# Patient Record
Sex: Female | Born: 1970
Health system: Southern US, Community
[De-identification: ages and names within clinical notes are randomized; demographics above are authoritative.]

## PROBLEM LIST (undated history)

## (undated) DIAGNOSIS — K9189 Other postprocedural complications and disorders of digestive system: Secondary | ICD-10-CM

## (undated) DIAGNOSIS — H547 Unspecified visual loss: Secondary | ICD-10-CM

## (undated) DIAGNOSIS — I639 Cerebral infarction, unspecified: Secondary | ICD-10-CM

## (undated) DIAGNOSIS — I10 Essential (primary) hypertension: Secondary | ICD-10-CM

## (undated) DIAGNOSIS — I2510 Atherosclerotic heart disease of native coronary artery without angina pectoris: Secondary | ICD-10-CM

## (undated) DIAGNOSIS — D649 Anemia, unspecified: Secondary | ICD-10-CM

## (undated) DIAGNOSIS — M1990 Unspecified osteoarthritis, unspecified site: Secondary | ICD-10-CM

## (undated) DIAGNOSIS — E119 Type 2 diabetes mellitus without complications: Secondary | ICD-10-CM

## (undated) DIAGNOSIS — G4730 Sleep apnea, unspecified: Secondary | ICD-10-CM

## (undated) DIAGNOSIS — F32A Depression, unspecified: Secondary | ICD-10-CM

## (undated) DIAGNOSIS — R413 Other amnesia: Secondary | ICD-10-CM

## (undated) DIAGNOSIS — K219 Gastro-esophageal reflux disease without esophagitis: Secondary | ICD-10-CM

## (undated) DIAGNOSIS — E785 Hyperlipidemia, unspecified: Secondary | ICD-10-CM

## (undated) DIAGNOSIS — E039 Hypothyroidism, unspecified: Secondary | ICD-10-CM

## (undated) DIAGNOSIS — N289 Disorder of kidney and ureter, unspecified: Secondary | ICD-10-CM

## (undated) DIAGNOSIS — Z9289 Personal history of other medical treatment: Secondary | ICD-10-CM

## (undated) DIAGNOSIS — J449 Chronic obstructive pulmonary disease, unspecified: Secondary | ICD-10-CM

## (undated) DIAGNOSIS — I509 Heart failure, unspecified: Secondary | ICD-10-CM

## (undated) DIAGNOSIS — I252 Old myocardial infarction: Secondary | ICD-10-CM

## (undated) DIAGNOSIS — R739 Hyperglycemia, unspecified: Secondary | ICD-10-CM

## (undated) DIAGNOSIS — F419 Anxiety disorder, unspecified: Secondary | ICD-10-CM

## (undated) DIAGNOSIS — K567 Ileus, unspecified: Secondary | ICD-10-CM

## (undated) HISTORY — DX: Other postprocedural complications and disorders of digestive system: K91.89

## (undated) HISTORY — DX: Hyperglycemia, unspecified: R73.9

## (undated) HISTORY — DX: Ileus, unspecified: K56.7

## (undated) HISTORY — PX: ABDOMINAL HYSTERECTOMY: SHX81

## (undated) HISTORY — PX: EYE SURGERY: SHX253

## (undated) HISTORY — PX: TOE AMPUTATION: SHX809

## (undated) HISTORY — DX: Ileus, unspecified: K91.89

---

## 2012-11-16 HISTORY — PX: ABDOMINAL HYSTERECTOMY: SHX81

## 2016-04-21 DIAGNOSIS — M542 Cervicalgia: Secondary | ICD-10-CM

## 2016-04-21 HISTORY — DX: Cervicalgia: M54.2

## 2016-05-04 DIAGNOSIS — E1142 Type 2 diabetes mellitus with diabetic polyneuropathy: Secondary | ICD-10-CM

## 2016-05-04 HISTORY — DX: Type 2 diabetes mellitus with diabetic polyneuropathy: E11.42

## 2016-12-28 DIAGNOSIS — E669 Obesity, unspecified: Secondary | ICD-10-CM | POA: Insufficient documentation

## 2018-08-08 DIAGNOSIS — E785 Hyperlipidemia, unspecified: Secondary | ICD-10-CM | POA: Diagnosis not present

## 2018-08-08 DIAGNOSIS — Z23 Encounter for immunization: Secondary | ICD-10-CM | POA: Diagnosis not present

## 2018-08-08 DIAGNOSIS — Z1331 Encounter for screening for depression: Secondary | ICD-10-CM | POA: Diagnosis not present

## 2018-08-08 DIAGNOSIS — Z1339 Encounter for screening examination for other mental health and behavioral disorders: Secondary | ICD-10-CM | POA: Diagnosis not present

## 2018-08-08 DIAGNOSIS — I509 Heart failure, unspecified: Secondary | ICD-10-CM | POA: Diagnosis not present

## 2018-08-08 DIAGNOSIS — I10 Essential (primary) hypertension: Secondary | ICD-10-CM | POA: Diagnosis not present

## 2018-08-08 DIAGNOSIS — E039 Hypothyroidism, unspecified: Secondary | ICD-10-CM | POA: Diagnosis not present

## 2018-08-08 DIAGNOSIS — E118 Type 2 diabetes mellitus with unspecified complications: Secondary | ICD-10-CM | POA: Diagnosis not present

## 2018-08-08 DIAGNOSIS — F332 Major depressive disorder, recurrent severe without psychotic features: Secondary | ICD-10-CM | POA: Diagnosis not present

## 2018-08-15 DIAGNOSIS — I509 Heart failure, unspecified: Secondary | ICD-10-CM | POA: Diagnosis not present

## 2018-08-15 DIAGNOSIS — E118 Type 2 diabetes mellitus with unspecified complications: Secondary | ICD-10-CM | POA: Diagnosis not present

## 2018-08-15 DIAGNOSIS — N183 Chronic kidney disease, stage 3 (moderate): Secondary | ICD-10-CM | POA: Diagnosis not present

## 2018-08-15 DIAGNOSIS — E785 Hyperlipidemia, unspecified: Secondary | ICD-10-CM | POA: Diagnosis not present

## 2018-08-16 DIAGNOSIS — M159 Polyosteoarthritis, unspecified: Secondary | ICD-10-CM | POA: Diagnosis not present

## 2018-08-16 DIAGNOSIS — I639 Cerebral infarction, unspecified: Secondary | ICD-10-CM | POA: Diagnosis not present

## 2018-08-16 DIAGNOSIS — E875 Hyperkalemia: Secondary | ICD-10-CM | POA: Diagnosis not present

## 2018-08-16 DIAGNOSIS — J449 Chronic obstructive pulmonary disease, unspecified: Secondary | ICD-10-CM | POA: Diagnosis not present

## 2018-08-16 DIAGNOSIS — I2510 Atherosclerotic heart disease of native coronary artery without angina pectoris: Secondary | ICD-10-CM | POA: Diagnosis not present

## 2018-08-16 DIAGNOSIS — E118 Type 2 diabetes mellitus with unspecified complications: Secondary | ICD-10-CM | POA: Diagnosis not present

## 2018-08-16 DIAGNOSIS — N183 Chronic kidney disease, stage 3 (moderate): Secondary | ICD-10-CM | POA: Diagnosis not present

## 2018-08-16 DIAGNOSIS — D649 Anemia, unspecified: Secondary | ICD-10-CM | POA: Diagnosis not present

## 2018-08-16 DIAGNOSIS — I509 Heart failure, unspecified: Secondary | ICD-10-CM | POA: Diagnosis not present

## 2018-08-19 DIAGNOSIS — I639 Cerebral infarction, unspecified: Secondary | ICD-10-CM | POA: Diagnosis not present

## 2018-08-19 DIAGNOSIS — J449 Chronic obstructive pulmonary disease, unspecified: Secondary | ICD-10-CM | POA: Diagnosis not present

## 2018-08-19 DIAGNOSIS — E875 Hyperkalemia: Secondary | ICD-10-CM | POA: Diagnosis not present

## 2018-08-19 DIAGNOSIS — I2510 Atherosclerotic heart disease of native coronary artery without angina pectoris: Secondary | ICD-10-CM | POA: Diagnosis not present

## 2018-08-19 DIAGNOSIS — N183 Chronic kidney disease, stage 3 (moderate): Secondary | ICD-10-CM | POA: Diagnosis not present

## 2018-08-19 DIAGNOSIS — D649 Anemia, unspecified: Secondary | ICD-10-CM | POA: Diagnosis not present

## 2018-08-19 DIAGNOSIS — M159 Polyosteoarthritis, unspecified: Secondary | ICD-10-CM | POA: Diagnosis not present

## 2018-08-19 DIAGNOSIS — I509 Heart failure, unspecified: Secondary | ICD-10-CM | POA: Diagnosis not present

## 2018-08-19 DIAGNOSIS — E118 Type 2 diabetes mellitus with unspecified complications: Secondary | ICD-10-CM | POA: Diagnosis not present

## 2018-08-22 DIAGNOSIS — I639 Cerebral infarction, unspecified: Secondary | ICD-10-CM | POA: Diagnosis not present

## 2018-08-22 DIAGNOSIS — I509 Heart failure, unspecified: Secondary | ICD-10-CM | POA: Diagnosis not present

## 2018-08-22 DIAGNOSIS — I2510 Atherosclerotic heart disease of native coronary artery without angina pectoris: Secondary | ICD-10-CM | POA: Diagnosis not present

## 2018-08-22 DIAGNOSIS — M159 Polyosteoarthritis, unspecified: Secondary | ICD-10-CM | POA: Diagnosis not present

## 2018-08-22 DIAGNOSIS — E875 Hyperkalemia: Secondary | ICD-10-CM | POA: Diagnosis not present

## 2018-08-22 DIAGNOSIS — E118 Type 2 diabetes mellitus with unspecified complications: Secondary | ICD-10-CM | POA: Diagnosis not present

## 2018-08-22 DIAGNOSIS — N183 Chronic kidney disease, stage 3 (moderate): Secondary | ICD-10-CM | POA: Diagnosis not present

## 2018-08-22 DIAGNOSIS — D649 Anemia, unspecified: Secondary | ICD-10-CM | POA: Diagnosis not present

## 2018-08-22 DIAGNOSIS — J449 Chronic obstructive pulmonary disease, unspecified: Secondary | ICD-10-CM | POA: Diagnosis not present

## 2018-08-29 DIAGNOSIS — M159 Polyosteoarthritis, unspecified: Secondary | ICD-10-CM | POA: Diagnosis not present

## 2018-08-29 DIAGNOSIS — E875 Hyperkalemia: Secondary | ICD-10-CM | POA: Diagnosis not present

## 2018-08-29 DIAGNOSIS — N183 Chronic kidney disease, stage 3 (moderate): Secondary | ICD-10-CM | POA: Diagnosis not present

## 2018-08-29 DIAGNOSIS — J449 Chronic obstructive pulmonary disease, unspecified: Secondary | ICD-10-CM | POA: Diagnosis not present

## 2018-08-29 DIAGNOSIS — I509 Heart failure, unspecified: Secondary | ICD-10-CM | POA: Diagnosis not present

## 2018-08-29 DIAGNOSIS — I2510 Atherosclerotic heart disease of native coronary artery without angina pectoris: Secondary | ICD-10-CM | POA: Diagnosis not present

## 2018-08-29 DIAGNOSIS — I679 Cerebrovascular disease, unspecified: Secondary | ICD-10-CM | POA: Diagnosis not present

## 2018-08-29 DIAGNOSIS — D649 Anemia, unspecified: Secondary | ICD-10-CM | POA: Diagnosis not present

## 2018-08-29 DIAGNOSIS — F419 Anxiety disorder, unspecified: Secondary | ICD-10-CM | POA: Diagnosis not present

## 2018-09-08 DIAGNOSIS — S83512A Sprain of anterior cruciate ligament of left knee, initial encounter: Secondary | ICD-10-CM | POA: Diagnosis not present

## 2018-09-08 DIAGNOSIS — S8392XA Sprain of unspecified site of left knee, initial encounter: Secondary | ICD-10-CM | POA: Diagnosis not present

## 2018-09-12 DIAGNOSIS — J449 Chronic obstructive pulmonary disease, unspecified: Secondary | ICD-10-CM | POA: Diagnosis not present

## 2018-09-12 DIAGNOSIS — I679 Cerebrovascular disease, unspecified: Secondary | ICD-10-CM | POA: Diagnosis not present

## 2018-09-12 DIAGNOSIS — I509 Heart failure, unspecified: Secondary | ICD-10-CM | POA: Diagnosis not present

## 2018-09-12 DIAGNOSIS — N183 Chronic kidney disease, stage 3 (moderate): Secondary | ICD-10-CM | POA: Diagnosis not present

## 2018-09-12 DIAGNOSIS — F419 Anxiety disorder, unspecified: Secondary | ICD-10-CM | POA: Diagnosis not present

## 2018-09-12 DIAGNOSIS — M159 Polyosteoarthritis, unspecified: Secondary | ICD-10-CM | POA: Diagnosis not present

## 2018-09-12 DIAGNOSIS — F3341 Major depressive disorder, recurrent, in partial remission: Secondary | ICD-10-CM | POA: Diagnosis not present

## 2018-09-12 DIAGNOSIS — I2510 Atherosclerotic heart disease of native coronary artery without angina pectoris: Secondary | ICD-10-CM | POA: Diagnosis not present

## 2018-09-12 DIAGNOSIS — D649 Anemia, unspecified: Secondary | ICD-10-CM | POA: Diagnosis not present

## 2018-09-20 DIAGNOSIS — M159 Polyosteoarthritis, unspecified: Secondary | ICD-10-CM | POA: Diagnosis not present

## 2018-09-20 DIAGNOSIS — I509 Heart failure, unspecified: Secondary | ICD-10-CM | POA: Diagnosis not present

## 2018-09-20 DIAGNOSIS — J449 Chronic obstructive pulmonary disease, unspecified: Secondary | ICD-10-CM | POA: Diagnosis not present

## 2018-09-20 DIAGNOSIS — I679 Cerebrovascular disease, unspecified: Secondary | ICD-10-CM | POA: Diagnosis not present

## 2018-09-20 DIAGNOSIS — F419 Anxiety disorder, unspecified: Secondary | ICD-10-CM | POA: Diagnosis not present

## 2018-09-20 DIAGNOSIS — D649 Anemia, unspecified: Secondary | ICD-10-CM | POA: Diagnosis not present

## 2018-09-20 DIAGNOSIS — F3341 Major depressive disorder, recurrent, in partial remission: Secondary | ICD-10-CM | POA: Diagnosis not present

## 2018-09-20 DIAGNOSIS — G5603 Carpal tunnel syndrome, bilateral upper limbs: Secondary | ICD-10-CM | POA: Diagnosis not present

## 2018-09-20 DIAGNOSIS — N183 Chronic kidney disease, stage 3 (moderate): Secondary | ICD-10-CM | POA: Diagnosis not present

## 2018-10-03 DIAGNOSIS — K219 Gastro-esophageal reflux disease without esophagitis: Secondary | ICD-10-CM | POA: Diagnosis not present

## 2018-10-03 DIAGNOSIS — J449 Chronic obstructive pulmonary disease, unspecified: Secondary | ICD-10-CM | POA: Diagnosis not present

## 2018-10-03 DIAGNOSIS — I679 Cerebrovascular disease, unspecified: Secondary | ICD-10-CM | POA: Diagnosis not present

## 2018-10-03 DIAGNOSIS — G5603 Carpal tunnel syndrome, bilateral upper limbs: Secondary | ICD-10-CM | POA: Diagnosis not present

## 2018-10-03 DIAGNOSIS — D649 Anemia, unspecified: Secondary | ICD-10-CM | POA: Diagnosis not present

## 2018-10-03 DIAGNOSIS — I509 Heart failure, unspecified: Secondary | ICD-10-CM | POA: Diagnosis not present

## 2018-10-03 DIAGNOSIS — M159 Polyosteoarthritis, unspecified: Secondary | ICD-10-CM | POA: Diagnosis not present

## 2018-10-03 DIAGNOSIS — N183 Chronic kidney disease, stage 3 (moderate): Secondary | ICD-10-CM | POA: Diagnosis not present

## 2018-10-03 DIAGNOSIS — I10 Essential (primary) hypertension: Secondary | ICD-10-CM | POA: Diagnosis not present

## 2018-10-22 DIAGNOSIS — J449 Chronic obstructive pulmonary disease, unspecified: Secondary | ICD-10-CM | POA: Diagnosis not present

## 2018-10-22 DIAGNOSIS — I10 Essential (primary) hypertension: Secondary | ICD-10-CM | POA: Diagnosis not present

## 2018-10-22 DIAGNOSIS — D649 Anemia, unspecified: Secondary | ICD-10-CM | POA: Diagnosis not present

## 2018-10-22 DIAGNOSIS — I679 Cerebrovascular disease, unspecified: Secondary | ICD-10-CM | POA: Diagnosis not present

## 2018-10-22 DIAGNOSIS — M159 Polyosteoarthritis, unspecified: Secondary | ICD-10-CM | POA: Diagnosis not present

## 2018-10-22 DIAGNOSIS — G5603 Carpal tunnel syndrome, bilateral upper limbs: Secondary | ICD-10-CM | POA: Diagnosis not present

## 2018-10-22 DIAGNOSIS — E669 Obesity, unspecified: Secondary | ICD-10-CM | POA: Diagnosis not present

## 2018-10-22 DIAGNOSIS — I509 Heart failure, unspecified: Secondary | ICD-10-CM | POA: Diagnosis not present

## 2018-10-22 DIAGNOSIS — N183 Chronic kidney disease, stage 3 (moderate): Secondary | ICD-10-CM | POA: Diagnosis not present

## 2018-11-21 DIAGNOSIS — I509 Heart failure, unspecified: Secondary | ICD-10-CM | POA: Diagnosis not present

## 2018-11-21 DIAGNOSIS — F419 Anxiety disorder, unspecified: Secondary | ICD-10-CM | POA: Diagnosis not present

## 2018-11-21 DIAGNOSIS — N183 Chronic kidney disease, stage 3 (moderate): Secondary | ICD-10-CM | POA: Diagnosis not present

## 2018-11-21 DIAGNOSIS — M159 Polyosteoarthritis, unspecified: Secondary | ICD-10-CM | POA: Diagnosis not present

## 2018-11-21 DIAGNOSIS — E118 Type 2 diabetes mellitus with unspecified complications: Secondary | ICD-10-CM | POA: Diagnosis not present

## 2018-11-21 DIAGNOSIS — D649 Anemia, unspecified: Secondary | ICD-10-CM | POA: Diagnosis not present

## 2018-11-21 DIAGNOSIS — I10 Essential (primary) hypertension: Secondary | ICD-10-CM | POA: Diagnosis not present

## 2018-11-21 DIAGNOSIS — G5603 Carpal tunnel syndrome, bilateral upper limbs: Secondary | ICD-10-CM | POA: Diagnosis not present

## 2018-11-21 DIAGNOSIS — J449 Chronic obstructive pulmonary disease, unspecified: Secondary | ICD-10-CM | POA: Diagnosis not present

## 2018-11-21 DIAGNOSIS — I679 Cerebrovascular disease, unspecified: Secondary | ICD-10-CM | POA: Diagnosis not present

## 2018-11-22 DIAGNOSIS — E103292 Type 1 diabetes mellitus with mild nonproliferative diabetic retinopathy without macular edema, left eye: Secondary | ICD-10-CM | POA: Diagnosis not present

## 2018-11-22 DIAGNOSIS — H2701 Aphakia, right eye: Secondary | ICD-10-CM | POA: Diagnosis not present

## 2018-11-23 DIAGNOSIS — N183 Chronic kidney disease, stage 3 (moderate): Secondary | ICD-10-CM | POA: Diagnosis not present

## 2018-11-23 DIAGNOSIS — E875 Hyperkalemia: Secondary | ICD-10-CM | POA: Diagnosis not present

## 2018-11-23 DIAGNOSIS — M159 Polyosteoarthritis, unspecified: Secondary | ICD-10-CM | POA: Diagnosis not present

## 2018-11-23 DIAGNOSIS — I509 Heart failure, unspecified: Secondary | ICD-10-CM | POA: Diagnosis not present

## 2018-11-23 DIAGNOSIS — J449 Chronic obstructive pulmonary disease, unspecified: Secondary | ICD-10-CM | POA: Diagnosis not present

## 2018-11-23 DIAGNOSIS — D649 Anemia, unspecified: Secondary | ICD-10-CM | POA: Diagnosis not present

## 2018-11-23 DIAGNOSIS — F419 Anxiety disorder, unspecified: Secondary | ICD-10-CM | POA: Diagnosis not present

## 2018-11-23 DIAGNOSIS — I679 Cerebrovascular disease, unspecified: Secondary | ICD-10-CM | POA: Diagnosis not present

## 2018-11-23 DIAGNOSIS — G5603 Carpal tunnel syndrome, bilateral upper limbs: Secondary | ICD-10-CM | POA: Diagnosis not present

## 2018-11-30 DIAGNOSIS — D649 Anemia, unspecified: Secondary | ICD-10-CM | POA: Diagnosis not present

## 2018-11-30 DIAGNOSIS — I509 Heart failure, unspecified: Secondary | ICD-10-CM | POA: Diagnosis not present

## 2018-11-30 DIAGNOSIS — J449 Chronic obstructive pulmonary disease, unspecified: Secondary | ICD-10-CM | POA: Diagnosis not present

## 2018-11-30 DIAGNOSIS — E785 Hyperlipidemia, unspecified: Secondary | ICD-10-CM | POA: Diagnosis not present

## 2018-11-30 DIAGNOSIS — E875 Hyperkalemia: Secondary | ICD-10-CM | POA: Diagnosis not present

## 2018-11-30 DIAGNOSIS — G5603 Carpal tunnel syndrome, bilateral upper limbs: Secondary | ICD-10-CM | POA: Diagnosis not present

## 2018-11-30 DIAGNOSIS — M159 Polyosteoarthritis, unspecified: Secondary | ICD-10-CM | POA: Diagnosis not present

## 2018-11-30 DIAGNOSIS — F419 Anxiety disorder, unspecified: Secondary | ICD-10-CM | POA: Diagnosis not present

## 2018-11-30 DIAGNOSIS — N183 Chronic kidney disease, stage 3 (moderate): Secondary | ICD-10-CM | POA: Diagnosis not present

## 2018-11-30 DIAGNOSIS — I679 Cerebrovascular disease, unspecified: Secondary | ICD-10-CM | POA: Diagnosis not present

## 2018-12-07 DIAGNOSIS — F419 Anxiety disorder, unspecified: Secondary | ICD-10-CM | POA: Diagnosis not present

## 2018-12-07 DIAGNOSIS — J0100 Acute maxillary sinusitis, unspecified: Secondary | ICD-10-CM | POA: Diagnosis not present

## 2018-12-07 DIAGNOSIS — J449 Chronic obstructive pulmonary disease, unspecified: Secondary | ICD-10-CM | POA: Diagnosis not present

## 2018-12-07 DIAGNOSIS — D649 Anemia, unspecified: Secondary | ICD-10-CM | POA: Diagnosis not present

## 2018-12-07 DIAGNOSIS — G5603 Carpal tunnel syndrome, bilateral upper limbs: Secondary | ICD-10-CM | POA: Diagnosis not present

## 2018-12-07 DIAGNOSIS — M159 Polyosteoarthritis, unspecified: Secondary | ICD-10-CM | POA: Diagnosis not present

## 2018-12-07 DIAGNOSIS — N183 Chronic kidney disease, stage 3 (moderate): Secondary | ICD-10-CM | POA: Diagnosis not present

## 2018-12-07 DIAGNOSIS — I509 Heart failure, unspecified: Secondary | ICD-10-CM | POA: Diagnosis not present

## 2018-12-07 DIAGNOSIS — I679 Cerebrovascular disease, unspecified: Secondary | ICD-10-CM | POA: Diagnosis not present

## 2018-12-15 DIAGNOSIS — S0502XA Injury of conjunctiva and corneal abrasion without foreign body, left eye, initial encounter: Secondary | ICD-10-CM | POA: Diagnosis not present

## 2018-12-15 DIAGNOSIS — H402223 Chronic angle-closure glaucoma, left eye, severe stage: Secondary | ICD-10-CM | POA: Diagnosis not present

## 2018-12-15 DIAGNOSIS — H2512 Age-related nuclear cataract, left eye: Secondary | ICD-10-CM | POA: Diagnosis not present

## 2018-12-15 DIAGNOSIS — Z8669 Personal history of other diseases of the nervous system and sense organs: Secondary | ICD-10-CM | POA: Diagnosis not present

## 2018-12-15 DIAGNOSIS — H5440 Blindness, one eye, unspecified eye: Secondary | ICD-10-CM | POA: Diagnosis not present

## 2018-12-21 DIAGNOSIS — D649 Anemia, unspecified: Secondary | ICD-10-CM | POA: Diagnosis not present

## 2018-12-21 DIAGNOSIS — M159 Polyosteoarthritis, unspecified: Secondary | ICD-10-CM | POA: Diagnosis not present

## 2018-12-21 DIAGNOSIS — I509 Heart failure, unspecified: Secondary | ICD-10-CM | POA: Diagnosis not present

## 2018-12-21 DIAGNOSIS — E875 Hyperkalemia: Secondary | ICD-10-CM | POA: Diagnosis not present

## 2018-12-21 DIAGNOSIS — G5603 Carpal tunnel syndrome, bilateral upper limbs: Secondary | ICD-10-CM | POA: Diagnosis not present

## 2018-12-21 DIAGNOSIS — J449 Chronic obstructive pulmonary disease, unspecified: Secondary | ICD-10-CM | POA: Diagnosis not present

## 2018-12-21 DIAGNOSIS — F419 Anxiety disorder, unspecified: Secondary | ICD-10-CM | POA: Diagnosis not present

## 2018-12-21 DIAGNOSIS — I679 Cerebrovascular disease, unspecified: Secondary | ICD-10-CM | POA: Diagnosis not present

## 2018-12-21 DIAGNOSIS — N183 Chronic kidney disease, stage 3 (moderate): Secondary | ICD-10-CM | POA: Diagnosis not present

## 2019-01-04 DIAGNOSIS — J449 Chronic obstructive pulmonary disease, unspecified: Secondary | ICD-10-CM | POA: Diagnosis not present

## 2019-01-04 DIAGNOSIS — N183 Chronic kidney disease, stage 3 (moderate): Secondary | ICD-10-CM | POA: Diagnosis not present

## 2019-01-04 DIAGNOSIS — E875 Hyperkalemia: Secondary | ICD-10-CM | POA: Diagnosis not present

## 2019-01-04 DIAGNOSIS — M159 Polyosteoarthritis, unspecified: Secondary | ICD-10-CM | POA: Diagnosis not present

## 2019-01-04 DIAGNOSIS — I679 Cerebrovascular disease, unspecified: Secondary | ICD-10-CM | POA: Diagnosis not present

## 2019-01-04 DIAGNOSIS — G5603 Carpal tunnel syndrome, bilateral upper limbs: Secondary | ICD-10-CM | POA: Diagnosis not present

## 2019-01-04 DIAGNOSIS — I509 Heart failure, unspecified: Secondary | ICD-10-CM | POA: Diagnosis not present

## 2019-01-04 DIAGNOSIS — D649 Anemia, unspecified: Secondary | ICD-10-CM | POA: Diagnosis not present

## 2019-01-04 DIAGNOSIS — F419 Anxiety disorder, unspecified: Secondary | ICD-10-CM | POA: Diagnosis not present

## 2019-01-06 DIAGNOSIS — E113552 Type 2 diabetes mellitus with stable proliferative diabetic retinopathy, left eye: Secondary | ICD-10-CM | POA: Diagnosis not present

## 2019-01-18 DIAGNOSIS — G5603 Carpal tunnel syndrome, bilateral upper limbs: Secondary | ICD-10-CM | POA: Diagnosis not present

## 2019-01-18 DIAGNOSIS — F419 Anxiety disorder, unspecified: Secondary | ICD-10-CM | POA: Diagnosis not present

## 2019-01-18 DIAGNOSIS — I509 Heart failure, unspecified: Secondary | ICD-10-CM | POA: Diagnosis not present

## 2019-01-18 DIAGNOSIS — N183 Chronic kidney disease, stage 3 (moderate): Secondary | ICD-10-CM | POA: Diagnosis not present

## 2019-01-18 DIAGNOSIS — D649 Anemia, unspecified: Secondary | ICD-10-CM | POA: Diagnosis not present

## 2019-01-18 DIAGNOSIS — J449 Chronic obstructive pulmonary disease, unspecified: Secondary | ICD-10-CM | POA: Diagnosis not present

## 2019-01-18 DIAGNOSIS — E875 Hyperkalemia: Secondary | ICD-10-CM | POA: Diagnosis not present

## 2019-01-18 DIAGNOSIS — I679 Cerebrovascular disease, unspecified: Secondary | ICD-10-CM | POA: Diagnosis not present

## 2019-01-18 DIAGNOSIS — M159 Polyosteoarthritis, unspecified: Secondary | ICD-10-CM | POA: Diagnosis not present

## 2019-01-23 DIAGNOSIS — H401133 Primary open-angle glaucoma, bilateral, severe stage: Secondary | ICD-10-CM | POA: Diagnosis not present

## 2019-02-01 DIAGNOSIS — E875 Hyperkalemia: Secondary | ICD-10-CM | POA: Diagnosis not present

## 2019-02-01 DIAGNOSIS — F419 Anxiety disorder, unspecified: Secondary | ICD-10-CM | POA: Diagnosis not present

## 2019-02-01 DIAGNOSIS — N183 Chronic kidney disease, stage 3 (moderate): Secondary | ICD-10-CM | POA: Diagnosis not present

## 2019-02-01 DIAGNOSIS — E785 Hyperlipidemia, unspecified: Secondary | ICD-10-CM | POA: Diagnosis not present

## 2019-02-01 DIAGNOSIS — G5603 Carpal tunnel syndrome, bilateral upper limbs: Secondary | ICD-10-CM | POA: Diagnosis not present

## 2019-02-01 DIAGNOSIS — I679 Cerebrovascular disease, unspecified: Secondary | ICD-10-CM | POA: Diagnosis not present

## 2019-02-01 DIAGNOSIS — J449 Chronic obstructive pulmonary disease, unspecified: Secondary | ICD-10-CM | POA: Diagnosis not present

## 2019-02-01 DIAGNOSIS — M159 Polyosteoarthritis, unspecified: Secondary | ICD-10-CM | POA: Diagnosis not present

## 2019-02-01 DIAGNOSIS — I509 Heart failure, unspecified: Secondary | ICD-10-CM | POA: Diagnosis not present

## 2019-02-01 DIAGNOSIS — D649 Anemia, unspecified: Secondary | ICD-10-CM | POA: Diagnosis not present

## 2019-02-14 DIAGNOSIS — Z1231 Encounter for screening mammogram for malignant neoplasm of breast: Secondary | ICD-10-CM | POA: Diagnosis not present

## 2019-02-14 DIAGNOSIS — Z0000 Encounter for general adult medical examination without abnormal findings: Secondary | ICD-10-CM | POA: Diagnosis not present

## 2019-02-14 DIAGNOSIS — E785 Hyperlipidemia, unspecified: Secondary | ICD-10-CM | POA: Diagnosis not present

## 2019-02-14 DIAGNOSIS — Z6833 Body mass index (BMI) 33.0-33.9, adult: Secondary | ICD-10-CM | POA: Diagnosis not present

## 2019-02-16 DIAGNOSIS — M159 Polyosteoarthritis, unspecified: Secondary | ICD-10-CM | POA: Diagnosis not present

## 2019-02-16 DIAGNOSIS — F419 Anxiety disorder, unspecified: Secondary | ICD-10-CM | POA: Diagnosis not present

## 2019-02-16 DIAGNOSIS — I679 Cerebrovascular disease, unspecified: Secondary | ICD-10-CM | POA: Diagnosis not present

## 2019-02-16 DIAGNOSIS — N183 Chronic kidney disease, stage 3 (moderate): Secondary | ICD-10-CM | POA: Diagnosis not present

## 2019-02-16 DIAGNOSIS — D649 Anemia, unspecified: Secondary | ICD-10-CM | POA: Diagnosis not present

## 2019-02-16 DIAGNOSIS — J449 Chronic obstructive pulmonary disease, unspecified: Secondary | ICD-10-CM | POA: Diagnosis not present

## 2019-02-16 DIAGNOSIS — E875 Hyperkalemia: Secondary | ICD-10-CM | POA: Diagnosis not present

## 2019-02-16 DIAGNOSIS — I509 Heart failure, unspecified: Secondary | ICD-10-CM | POA: Diagnosis not present

## 2019-02-16 DIAGNOSIS — G5603 Carpal tunnel syndrome, bilateral upper limbs: Secondary | ICD-10-CM | POA: Diagnosis not present

## 2019-03-02 DIAGNOSIS — D649 Anemia, unspecified: Secondary | ICD-10-CM | POA: Diagnosis not present

## 2019-03-02 DIAGNOSIS — I679 Cerebrovascular disease, unspecified: Secondary | ICD-10-CM | POA: Diagnosis not present

## 2019-03-02 DIAGNOSIS — E875 Hyperkalemia: Secondary | ICD-10-CM | POA: Diagnosis not present

## 2019-03-02 DIAGNOSIS — F3341 Major depressive disorder, recurrent, in partial remission: Secondary | ICD-10-CM | POA: Diagnosis not present

## 2019-03-02 DIAGNOSIS — I10 Essential (primary) hypertension: Secondary | ICD-10-CM | POA: Diagnosis not present

## 2019-03-02 DIAGNOSIS — N183 Chronic kidney disease, stage 3 (moderate): Secondary | ICD-10-CM | POA: Diagnosis not present

## 2019-03-02 DIAGNOSIS — E118 Type 2 diabetes mellitus with unspecified complications: Secondary | ICD-10-CM | POA: Diagnosis not present

## 2019-03-02 DIAGNOSIS — G5603 Carpal tunnel syndrome, bilateral upper limbs: Secondary | ICD-10-CM | POA: Diagnosis not present

## 2019-03-02 DIAGNOSIS — J449 Chronic obstructive pulmonary disease, unspecified: Secondary | ICD-10-CM | POA: Diagnosis not present

## 2019-03-02 DIAGNOSIS — I509 Heart failure, unspecified: Secondary | ICD-10-CM | POA: Diagnosis not present

## 2019-03-02 DIAGNOSIS — M159 Polyosteoarthritis, unspecified: Secondary | ICD-10-CM | POA: Diagnosis not present

## 2019-03-07 DIAGNOSIS — E162 Hypoglycemia, unspecified: Secondary | ICD-10-CM | POA: Diagnosis not present

## 2019-03-07 DIAGNOSIS — F329 Major depressive disorder, single episode, unspecified: Secondary | ICD-10-CM | POA: Diagnosis not present

## 2019-03-07 DIAGNOSIS — R442 Other hallucinations: Secondary | ICD-10-CM | POA: Diagnosis not present

## 2019-03-07 DIAGNOSIS — K219 Gastro-esophageal reflux disease without esophagitis: Secondary | ICD-10-CM | POA: Diagnosis not present

## 2019-03-07 DIAGNOSIS — E161 Other hypoglycemia: Secondary | ICD-10-CM | POA: Diagnosis not present

## 2019-03-07 DIAGNOSIS — Z23 Encounter for immunization: Secondary | ICD-10-CM | POA: Diagnosis not present

## 2019-03-07 DIAGNOSIS — Z8673 Personal history of transient ischemic attack (TIA), and cerebral infarction without residual deficits: Secondary | ICD-10-CM | POA: Diagnosis not present

## 2019-03-07 DIAGNOSIS — Z046 Encounter for general psychiatric examination, requested by authority: Secondary | ICD-10-CM | POA: Diagnosis not present

## 2019-03-07 DIAGNOSIS — J449 Chronic obstructive pulmonary disease, unspecified: Secondary | ICD-10-CM | POA: Diagnosis not present

## 2019-03-07 DIAGNOSIS — I509 Heart failure, unspecified: Secondary | ICD-10-CM | POA: Diagnosis not present

## 2019-03-07 DIAGNOSIS — I252 Old myocardial infarction: Secondary | ICD-10-CM | POA: Diagnosis not present

## 2019-03-07 DIAGNOSIS — E7800 Pure hypercholesterolemia, unspecified: Secondary | ICD-10-CM | POA: Diagnosis not present

## 2019-03-07 DIAGNOSIS — F29 Unspecified psychosis not due to a substance or known physiological condition: Secondary | ICD-10-CM | POA: Diagnosis not present

## 2019-03-07 DIAGNOSIS — I110 Hypertensive heart disease with heart failure: Secondary | ICD-10-CM | POA: Diagnosis not present

## 2019-03-07 DIAGNOSIS — E119 Type 2 diabetes mellitus without complications: Secondary | ICD-10-CM | POA: Diagnosis not present

## 2019-03-08 ENCOUNTER — Other Ambulatory Visit: Payer: Self-pay | Admitting: Registered Nurse

## 2019-03-09 ENCOUNTER — Inpatient Hospital Stay (HOSPITAL_COMMUNITY)
Admission: AD | Admit: 2019-03-09 | Discharge: 2019-03-10 | DRG: 885 | Disposition: A | Discharge status: Other Acute Inpatient Hospital | Payer: Medicare HMO | Source: Intra-hospital | Attending: Internal Medicine | Admitting: Internal Medicine

## 2019-03-09 ENCOUNTER — Other Ambulatory Visit: Payer: Self-pay

## 2019-03-09 ENCOUNTER — Encounter (HOSPITAL_COMMUNITY): Payer: Self-pay

## 2019-03-09 DIAGNOSIS — K219 Gastro-esophageal reflux disease without esophagitis: Secondary | ICD-10-CM | POA: Diagnosis not present

## 2019-03-09 DIAGNOSIS — F339 Major depressive disorder, recurrent, unspecified: Secondary | ICD-10-CM | POA: Diagnosis not present

## 2019-03-09 DIAGNOSIS — Z23 Encounter for immunization: Secondary | ICD-10-CM | POA: Diagnosis not present

## 2019-03-09 DIAGNOSIS — R45851 Suicidal ideations: Secondary | ICD-10-CM | POA: Diagnosis not present

## 2019-03-09 DIAGNOSIS — R05 Cough: Secondary | ICD-10-CM | POA: Diagnosis not present

## 2019-03-09 DIAGNOSIS — J189 Pneumonia, unspecified organism: Secondary | ICD-10-CM | POA: Diagnosis not present

## 2019-03-09 DIAGNOSIS — Z20828 Contact with and (suspected) exposure to other viral communicable diseases: Secondary | ICD-10-CM | POA: Diagnosis not present

## 2019-03-09 DIAGNOSIS — F4323 Adjustment disorder with mixed anxiety and depressed mood: Secondary | ICD-10-CM | POA: Diagnosis not present

## 2019-03-09 DIAGNOSIS — E1122 Type 2 diabetes mellitus with diabetic chronic kidney disease: Secondary | ICD-10-CM | POA: Diagnosis not present

## 2019-03-09 DIAGNOSIS — I959 Hypotension, unspecified: Secondary | ICD-10-CM | POA: Diagnosis not present

## 2019-03-09 DIAGNOSIS — Z7952 Long term (current) use of systemic steroids: Secondary | ICD-10-CM | POA: Diagnosis not present

## 2019-03-09 DIAGNOSIS — Z794 Long term (current) use of insulin: Secondary | ICD-10-CM | POA: Diagnosis not present

## 2019-03-09 DIAGNOSIS — I110 Hypertensive heart disease with heart failure: Secondary | ICD-10-CM | POA: Diagnosis not present

## 2019-03-09 DIAGNOSIS — Z90710 Acquired absence of both cervix and uterus: Secondary | ICD-10-CM | POA: Diagnosis not present

## 2019-03-09 DIAGNOSIS — J449 Chronic obstructive pulmonary disease, unspecified: Secondary | ICD-10-CM | POA: Diagnosis not present

## 2019-03-09 DIAGNOSIS — R509 Fever, unspecified: Secondary | ICD-10-CM | POA: Diagnosis not present

## 2019-03-09 DIAGNOSIS — N179 Acute kidney failure, unspecified: Secondary | ICD-10-CM | POA: Diagnosis not present

## 2019-03-09 DIAGNOSIS — A419 Sepsis, unspecified organism: Secondary | ICD-10-CM | POA: Diagnosis present

## 2019-03-09 DIAGNOSIS — I252 Old myocardial infarction: Secondary | ICD-10-CM

## 2019-03-09 DIAGNOSIS — J9601 Acute respiratory failure with hypoxia: Secondary | ICD-10-CM | POA: Diagnosis not present

## 2019-03-09 DIAGNOSIS — E11649 Type 2 diabetes mellitus with hypoglycemia without coma: Secondary | ICD-10-CM | POA: Diagnosis present

## 2019-03-09 DIAGNOSIS — N189 Chronic kidney disease, unspecified: Secondary | ICD-10-CM | POA: Diagnosis not present

## 2019-03-09 DIAGNOSIS — Z1159 Encounter for screening for other viral diseases: Secondary | ICD-10-CM | POA: Diagnosis not present

## 2019-03-09 DIAGNOSIS — F329 Major depressive disorder, single episode, unspecified: Secondary | ICD-10-CM | POA: Diagnosis not present

## 2019-03-09 DIAGNOSIS — F322 Major depressive disorder, single episode, severe without psychotic features: Secondary | ICD-10-CM

## 2019-03-09 DIAGNOSIS — Z8673 Personal history of transient ischemic attack (TIA), and cerebral infarction without residual deficits: Secondary | ICD-10-CM

## 2019-03-09 DIAGNOSIS — R571 Hypovolemic shock: Secondary | ICD-10-CM | POA: Diagnosis not present

## 2019-03-09 DIAGNOSIS — E7800 Pure hypercholesterolemia, unspecified: Secondary | ICD-10-CM | POA: Diagnosis not present

## 2019-03-09 DIAGNOSIS — I129 Hypertensive chronic kidney disease with stage 1 through stage 4 chronic kidney disease, or unspecified chronic kidney disease: Secondary | ICD-10-CM | POA: Diagnosis not present

## 2019-03-09 DIAGNOSIS — E1022 Type 1 diabetes mellitus with diabetic chronic kidney disease: Secondary | ICD-10-CM | POA: Diagnosis not present

## 2019-03-09 DIAGNOSIS — Z7902 Long term (current) use of antithrombotics/antiplatelets: Secondary | ICD-10-CM

## 2019-03-09 DIAGNOSIS — Z7982 Long term (current) use of aspirin: Secondary | ICD-10-CM

## 2019-03-09 DIAGNOSIS — Z79890 Hormone replacement therapy (postmenopausal): Secondary | ICD-10-CM | POA: Diagnosis not present

## 2019-03-09 DIAGNOSIS — E1165 Type 2 diabetes mellitus with hyperglycemia: Secondary | ICD-10-CM | POA: Diagnosis not present

## 2019-03-09 DIAGNOSIS — I2510 Atherosclerotic heart disease of native coronary artery without angina pectoris: Secondary | ICD-10-CM | POA: Diagnosis not present

## 2019-03-09 DIAGNOSIS — I130 Hypertensive heart and chronic kidney disease with heart failure and stage 1 through stage 4 chronic kidney disease, or unspecified chronic kidney disease: Secondary | ICD-10-CM | POA: Diagnosis present

## 2019-03-09 DIAGNOSIS — E875 Hyperkalemia: Secondary | ICD-10-CM | POA: Diagnosis not present

## 2019-03-09 DIAGNOSIS — E119 Type 2 diabetes mellitus without complications: Secondary | ICD-10-CM | POA: Diagnosis not present

## 2019-03-09 DIAGNOSIS — I509 Heart failure, unspecified: Secondary | ICD-10-CM | POA: Diagnosis present

## 2019-03-09 DIAGNOSIS — Z79899 Other long term (current) drug therapy: Secondary | ICD-10-CM | POA: Diagnosis not present

## 2019-03-09 HISTORY — DX: Disorder of kidney and ureter, unspecified: N28.9

## 2019-03-09 HISTORY — DX: Major depressive disorder, single episode, severe without psychotic features: F32.2

## 2019-03-09 HISTORY — DX: Heart failure, unspecified: I50.9

## 2019-03-09 HISTORY — DX: Cerebral infarction, unspecified: I63.9

## 2019-03-09 HISTORY — DX: Essential (primary) hypertension: I10

## 2019-03-09 HISTORY — DX: Type 2 diabetes mellitus without complications: E11.9

## 2019-03-09 HISTORY — DX: Major depressive disorder, recurrent, unspecified: F33.9

## 2019-03-09 HISTORY — DX: Old myocardial infarction: I25.2

## 2019-03-09 LAB — GLUCOSE, CAPILLARY
Glucose-Capillary: 277 mg/dL — ABNORMAL HIGH (ref 70–99)
Glucose-Capillary: 25 mg/dL — CL (ref 70–99)
Glucose-Capillary: 35 mg/dL — CL (ref 70–99)
Glucose-Capillary: 111 mg/dL — ABNORMAL HIGH (ref 70–99)
Glucose-Capillary: 44 mg/dL — CL (ref 70–99)
Glucose-Capillary: 44 mg/dL — CL (ref 70–99)

## 2019-03-09 MED ORDER — LATANOPROST 0.005 % OP SOLN
1.0000 [drp] | Freq: Every day | OPHTHALMIC | Status: DC
  Administered 2019-03-09: 1 [drp] via OPHTHALMIC
  Filled 2019-03-09 (×2): qty 2.5

## 2019-03-09 MED ORDER — TRAZODONE HCL 50 MG PO TABS: 50.0000 mg | ORAL_TABLET | Freq: Every evening | ORAL | Status: DC | PRN

## 2019-03-09 MED ORDER — ACETAMINOPHEN 325 MG PO TABS: 650.0000 mg | ORAL_TABLET | Freq: Four times a day (QID) | ORAL | Status: DC | PRN

## 2019-03-09 MED ORDER — LORAZEPAM 1 MG PO TABS: 1.0000 mg | ORAL_TABLET | Freq: Four times a day (QID) | ORAL | Status: DC | PRN

## 2019-03-09 MED ORDER — SERTRALINE HCL 50 MG PO TABS
100.0000 mg | ORAL_TABLET | Freq: Every day | ORAL | Status: DC
  Administered 2019-03-09 – 2019-03-10 (×2): 100 mg via ORAL
  Filled 2019-03-09 (×4): qty 1
  Filled 2019-03-09: qty 2

## 2019-03-09 MED ORDER — AMLODIPINE BESYLATE 5 MG PO TABS
10.0000 mg | ORAL_TABLET | Freq: Every day | ORAL | Status: DC
  Administered 2019-03-09 – 2019-03-10 (×2): 10 mg via ORAL
  Filled 2019-03-09 (×4): qty 1
  Filled 2019-03-09: qty 2

## 2019-03-09 MED ORDER — GLUCOSE 40 % PO GEL
ORAL | Status: AC
  Administered 2019-03-09: 37.5 g
  Filled 2019-03-09: qty 1

## 2019-03-09 MED ORDER — DORZOLAMIDE HCL-TIMOLOL MAL 2-0.5 % OP SOLN
1.0000 [drp] | Freq: Two times a day (BID) | OPHTHALMIC | Status: DC
  Administered 2019-03-09: 1 [drp] via OPHTHALMIC
  Filled 2019-03-09 (×3): qty 10

## 2019-03-09 MED ORDER — INSULIN ASPART 100 UNIT/ML ~~LOC~~ SOLN
0.0000 [IU] | Freq: Three times a day (TID) | SUBCUTANEOUS | Status: DC
  Administered 2019-03-09: 8 [IU] via SUBCUTANEOUS
  Administered 2019-03-10: 15 [IU] via SUBCUTANEOUS
  Filled 2019-03-09: qty 1

## 2019-03-09 MED ORDER — PREGABALIN 50 MG PO CAPS
75.0000 mg | ORAL_CAPSULE | Freq: Two times a day (BID) | ORAL | Status: DC
  Administered 2019-03-09 – 2019-03-10 (×2): 75 mg via ORAL
  Filled 2019-03-09 (×2): qty 1

## 2019-03-09 MED ORDER — LISINOPRIL 20 MG PO TABS
20.0000 mg | ORAL_TABLET | Freq: Every day | ORAL | Status: DC
  Administered 2019-03-09 – 2019-03-10 (×2): 20 mg via ORAL
  Filled 2019-03-09 (×5): qty 1

## 2019-03-09 MED ORDER — HYDRALAZINE HCL 25 MG PO TABS
25.0000 mg | ORAL_TABLET | Freq: Three times a day (TID) | ORAL | Status: DC
  Administered 2019-03-09 – 2019-03-10 (×2): 25 mg via ORAL
  Filled 2019-03-09 (×9): qty 1

## 2019-03-09 MED ORDER — ARIPIPRAZOLE 5 MG PO TABS
5.0000 mg | ORAL_TABLET | Freq: Every day | ORAL | Status: DC
  Administered 2019-03-09: 22:00:00 5 mg via ORAL
  Filled 2019-03-09 (×3): qty 1

## 2019-03-09 MED ORDER — ISOSORBIDE DINITRATE 20 MG PO TABS
20.0000 mg | ORAL_TABLET | Freq: Two times a day (BID) | ORAL | Status: DC
  Administered 2019-03-09 – 2019-03-10 (×2): 20 mg via ORAL
  Filled 2019-03-09: qty 1
  Filled 2019-03-09: qty 2
  Filled 2019-03-09 (×6): qty 1

## 2019-03-09 MED ORDER — INSULIN ASPART 100 UNIT/ML ~~LOC~~ SOLN
10.0000 [IU] | Freq: Three times a day (TID) | SUBCUTANEOUS | Status: DC
  Administered 2019-03-09 – 2019-03-10 (×2): 10 [IU] via SUBCUTANEOUS

## 2019-03-09 MED ORDER — PANTOPRAZOLE SODIUM 40 MG PO TBEC
40.0000 mg | DELAYED_RELEASE_TABLET | Freq: Every day | ORAL | Status: DC
  Administered 2019-03-09 – 2019-03-10 (×2): 40 mg via ORAL
  Filled 2019-03-09 (×5): qty 1

## 2019-03-09 MED ORDER — HYDROXYZINE HCL 25 MG PO TABS
25.0000 mg | ORAL_TABLET | Freq: Three times a day (TID) | ORAL | Status: DC | PRN
  Administered 2019-03-09: 25 mg via ORAL
  Filled 2019-03-09: qty 1

## 2019-03-09 MED ORDER — MAGNESIUM HYDROXIDE 400 MG/5ML PO SUSP: 30.0000 mL | Freq: Every day | ORAL | Status: DC | PRN

## 2019-03-09 MED ORDER — FUROSEMIDE 40 MG PO TABS
20.0000 mg | ORAL_TABLET | Freq: Every day | ORAL | Status: DC
  Administered 2019-03-09 – 2019-03-10 (×2): 20 mg via ORAL
  Filled 2019-03-09 (×5): qty 1

## 2019-03-09 MED ORDER — INSULIN ASPART 100 UNIT/ML ~~LOC~~ SOLN: 4.0000 [IU] | Freq: Three times a day (TID) | SUBCUTANEOUS | Status: DC

## 2019-03-09 MED ORDER — LISINOPRIL 10 MG PO TABS
10.0000 mg | ORAL_TABLET | Freq: Every day | ORAL | Status: DC
  Filled 2019-03-09 (×2): qty 1

## 2019-03-09 MED ORDER — CLOPIDOGREL BISULFATE 75 MG PO TABS
75.0000 mg | ORAL_TABLET | Freq: Every day | ORAL | Status: DC
  Administered 2019-03-09 – 2019-03-10 (×2): 75 mg via ORAL
  Filled 2019-03-09 (×6): qty 1

## 2019-03-09 MED ORDER — LEVOTHYROXINE SODIUM 50 MCG PO TABS
50.0000 ug | ORAL_TABLET | Freq: Every day | ORAL | Status: DC
  Administered 2019-03-10: 50 ug via ORAL
  Filled 2019-03-09 (×3): qty 1
  Filled 2019-03-09: qty 2

## 2019-03-09 MED ORDER — ATORVASTATIN CALCIUM 80 MG PO TABS
80.0000 mg | ORAL_TABLET | Freq: Every day | ORAL | Status: DC
  Administered 2019-03-09: 80 mg via ORAL
  Filled 2019-03-09: qty 2
  Filled 2019-03-09 (×3): qty 1

## 2019-03-09 MED ORDER — ALUM & MAG HYDROXIDE-SIMETH 200-200-20 MG/5ML PO SUSP: 30.0000 mL | ORAL | Status: DC | PRN

## 2019-03-09 MED ORDER — INSULIN DETEMIR 100 UNIT/ML ~~LOC~~ SOLN
30.0000 [IU] | Freq: Two times a day (BID) | SUBCUTANEOUS | Status: DC
  Administered 2019-03-09 – 2019-03-10 (×2): 30 [IU] via SUBCUTANEOUS
  Filled 2019-03-09 (×3): qty 0.3

## 2019-03-09 MED ORDER — BUPROPION HCL ER (XL) 150 MG PO TB24
300.0000 mg | ORAL_TABLET | Freq: Every day | ORAL | Status: DC
  Administered 2019-03-09 – 2019-03-10 (×2): 300 mg via ORAL
  Filled 2019-03-09: qty 1
  Filled 2019-03-09: qty 2
  Filled 2019-03-09 (×3): qty 1

## 2019-03-09 MED ORDER — INSULIN ASPART 100 UNIT/ML ~~LOC~~ SOLN
0.0000 [IU] | Freq: Every day | SUBCUTANEOUS | Status: DC
  Administered 2019-03-10: 5 [IU] via SUBCUTANEOUS

## 2019-03-09 NOTE — Progress Notes (Signed)
Admission Note: Patient is a 48 year old female who is admitted to the unit for suicidal ideation with plan to overdose on her insulin. Report auditory and visual hallucinations.  Report hearing little boy crying and calling out sometimes.  Patient is legally blind.  Patient is alert and oriented to person, place and time.  Presents with anxious affect and mood.  States she is here to get help for her mental breakdown.  Admission plan of care reviewed and consent signed.  Skin assessment and personal belongings completed.  Skin is dry and intact.  No contraband found.  Patient oriented to the unit, staff and room.  Verbalizes understanding of unit rules and protocols.  Routine safety checks initiated.  Patient is safe on the unit.

## 2019-03-09 NOTE — Tx Team (Signed)
Initial Treatment Plan 03/09/2019 4:57 PM Deanna Landry [id number]    PATIENT STRESSORS: Financial difficulties Health problems Medication change or noncompliance   PATIENT STRENGTHS: Capable of independent living Agricultural engineer for treatment/growth   PATIENT IDENTIFIED PROBLEMS: Suicidal Ideation  Depression  Anxiety                 DISCHARGE CRITERIA:  Ability to meet basic life and health needs Adequate post-discharge living arrangements  PRELIMINARY DISCHARGE PLAN: Attend aftercare/continuing care group Outpatient therapy Return to previous living arrangement  PATIENT/FAMILY INVOLVEMENT: This treatment plan has been presented to and reviewed with the patient, Deanna Landry, and/or family member.  The patient and family have been given the opportunity to ask questions and make suggestions.  Vela Prose, RN 03/09/2019, 4:57 PM
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
## 2019-03-09 NOTE — Progress Notes (Signed)
Psychoeducational Group Note  Date:  03/09/2019 Time:  2015 Group Topic/Focus:  wrap up group  Participation Level: Did Not Attend  Participation Quality:  Not Applicable  Affect:  Not Applicable  Cognitive:  Not Applicable  Insight:  Not Applicable  Engagement in Group: Not Applicable  Additional Comments:  Pt was notified that group was beginning but remained in bed.   Winfield Rast S 03/09/2019, 10:03 PM

## 2019-03-10 ENCOUNTER — Encounter (HOSPITAL_COMMUNITY): Payer: Self-pay | Admitting: Emergency Medicine

## 2019-03-10 ENCOUNTER — Inpatient Hospital Stay (HOSPITAL_COMMUNITY)
Admission: EM | Admit: 2019-03-10 | Discharge: 2019-03-16 | DRG: 871 | Disposition: A | Discharge status: Home Health | Payer: Medicare HMO | Attending: Internal Medicine | Admitting: Internal Medicine

## 2019-03-10 ENCOUNTER — Other Ambulatory Visit: Payer: Self-pay

## 2019-03-10 ENCOUNTER — Inpatient Hospital Stay (HOSPITAL_COMMUNITY): Payer: Medicare HMO

## 2019-03-10 DIAGNOSIS — R0602 Shortness of breath: Secondary | ICD-10-CM | POA: Diagnosis not present

## 2019-03-10 DIAGNOSIS — R509 Fever, unspecified: Secondary | ICD-10-CM

## 2019-03-10 DIAGNOSIS — I252 Old myocardial infarction: Secondary | ICD-10-CM

## 2019-03-10 DIAGNOSIS — I129 Hypertensive chronic kidney disease with stage 1 through stage 4 chronic kidney disease, or unspecified chronic kidney disease: Secondary | ICD-10-CM

## 2019-03-10 DIAGNOSIS — E10319 Type 1 diabetes mellitus with unspecified diabetic retinopathy without macular edema: Secondary | ICD-10-CM | POA: Diagnosis present

## 2019-03-10 DIAGNOSIS — E875 Hyperkalemia: Secondary | ICD-10-CM | POA: Diagnosis present

## 2019-03-10 DIAGNOSIS — F322 Major depressive disorder, single episode, severe without psychotic features: Secondary | ICD-10-CM | POA: Diagnosis not present

## 2019-03-10 DIAGNOSIS — R45851 Suicidal ideations: Secondary | ICD-10-CM

## 2019-03-10 DIAGNOSIS — E1065 Type 1 diabetes mellitus with hyperglycemia: Secondary | ICD-10-CM | POA: Diagnosis present

## 2019-03-10 DIAGNOSIS — E109 Type 1 diabetes mellitus without complications: Secondary | ICD-10-CM | POA: Diagnosis present

## 2019-03-10 DIAGNOSIS — J189 Pneumonia, unspecified organism: Secondary | ICD-10-CM | POA: Diagnosis not present

## 2019-03-10 DIAGNOSIS — A419 Sepsis, unspecified organism: Principal | ICD-10-CM | POA: Diagnosis present

## 2019-03-10 DIAGNOSIS — I509 Heart failure, unspecified: Secondary | ICD-10-CM | POA: Diagnosis present

## 2019-03-10 DIAGNOSIS — E1043 Type 1 diabetes mellitus with diabetic autonomic (poly)neuropathy: Secondary | ICD-10-CM | POA: Diagnosis present

## 2019-03-10 DIAGNOSIS — Z8673 Personal history of transient ischemic attack (TIA), and cerebral infarction without residual deficits: Secondary | ICD-10-CM

## 2019-03-10 DIAGNOSIS — I130 Hypertensive heart and chronic kidney disease with heart failure and stage 1 through stage 4 chronic kidney disease, or unspecified chronic kidney disease: Secondary | ICD-10-CM | POA: Diagnosis not present

## 2019-03-10 DIAGNOSIS — I2510 Atherosclerotic heart disease of native coronary artery without angina pectoris: Secondary | ICD-10-CM | POA: Diagnosis present

## 2019-03-10 DIAGNOSIS — R1310 Dysphagia, unspecified: Secondary | ICD-10-CM | POA: Diagnosis not present

## 2019-03-10 DIAGNOSIS — N179 Acute kidney failure, unspecified: Secondary | ICD-10-CM | POA: Diagnosis not present

## 2019-03-10 DIAGNOSIS — E1022 Type 1 diabetes mellitus with diabetic chronic kidney disease: Secondary | ICD-10-CM | POA: Diagnosis not present

## 2019-03-10 DIAGNOSIS — Z794 Long term (current) use of insulin: Secondary | ICD-10-CM | POA: Diagnosis not present

## 2019-03-10 DIAGNOSIS — J9601 Acute respiratory failure with hypoxia: Secondary | ICD-10-CM | POA: Diagnosis not present

## 2019-03-10 DIAGNOSIS — Z20828 Contact with and (suspected) exposure to other viral communicable diseases: Secondary | ICD-10-CM | POA: Diagnosis present

## 2019-03-10 DIAGNOSIS — N189 Chronic kidney disease, unspecified: Secondary | ICD-10-CM | POA: Diagnosis not present

## 2019-03-10 DIAGNOSIS — R571 Hypovolemic shock: Secondary | ICD-10-CM | POA: Diagnosis not present

## 2019-03-10 DIAGNOSIS — K3184 Gastroparesis: Secondary | ICD-10-CM | POA: Diagnosis present

## 2019-03-10 DIAGNOSIS — E1165 Type 2 diabetes mellitus with hyperglycemia: Secondary | ICD-10-CM | POA: Diagnosis not present

## 2019-03-10 DIAGNOSIS — E10649 Type 1 diabetes mellitus with hypoglycemia without coma: Secondary | ICD-10-CM | POA: Diagnosis present

## 2019-03-10 DIAGNOSIS — R918 Other nonspecific abnormal finding of lung field: Secondary | ICD-10-CM | POA: Diagnosis not present

## 2019-03-10 DIAGNOSIS — I959 Hypotension, unspecified: Secondary | ICD-10-CM | POA: Diagnosis not present

## 2019-03-10 DIAGNOSIS — F4323 Adjustment disorder with mixed anxiety and depressed mood: Secondary | ICD-10-CM | POA: Diagnosis not present

## 2019-03-10 DIAGNOSIS — R05 Cough: Secondary | ICD-10-CM | POA: Diagnosis not present

## 2019-03-10 DIAGNOSIS — I5042 Chronic combined systolic (congestive) and diastolic (congestive) heart failure: Secondary | ICD-10-CM | POA: Diagnosis not present

## 2019-03-10 HISTORY — DX: Sepsis, unspecified organism: A41.9

## 2019-03-10 HISTORY — DX: Fever, unspecified: R50.9

## 2019-03-10 LAB — COMPREHENSIVE METABOLIC PANEL
ALT: 10 U/L (ref 0–44)
AST: 9 U/L — ABNORMAL LOW (ref 15–41)
Albumin: 3.6 g/dL (ref 3.5–5.0)
Alkaline Phosphatase: 106 U/L (ref 38–126)
Anion gap: 8 (ref 5–15)
BUN: 64 mg/dL — ABNORMAL HIGH (ref 6–20)
CO2: 23 mmol/L (ref 22–32)
Calcium: 8.8 mg/dL — ABNORMAL LOW (ref 8.9–10.3)
Chloride: 107 mmol/L (ref 98–111)
Creatinine, Ser: 2.95 mg/dL — ABNORMAL HIGH (ref 0.44–1.00)
GFR calc Af Amer: 21 mL/min — ABNORMAL LOW (ref >60)
GFR calc non Af Amer: 18 mL/min — ABNORMAL LOW (ref >60)
Glucose, Bld: 133 mg/dL — ABNORMAL HIGH (ref 70–99)
Potassium: 5.1 mmol/L (ref 3.5–5.1)
Sodium: 138 mmol/L (ref 135–145)
Total Bilirubin: 0.4 mg/dL (ref 0.3–1.2)
Total Protein: 6.9 g/dL (ref 6.5–8.1)

## 2019-03-10 LAB — CBC WITH DIFFERENTIAL/PLATELET
Abs Immature Granulocytes: 0.02 10*3/uL (ref 0.00–0.07)
Basophils Absolute: 0 10*3/uL (ref 0.0–0.1)
Basophils Relative: 0 %
Eosinophils Absolute: 0 10*3/uL (ref 0.0–0.5)
Eosinophils Relative: 0 %
HCT: 30.9 % — ABNORMAL LOW (ref 36.0–46.0)
Hemoglobin: 9.8 g/dL — ABNORMAL LOW (ref 12.0–15.0)
Immature Granulocytes: 0 %
Lymphocytes Relative: 14 %
Lymphs Abs: 1 10*3/uL (ref 0.7–4.0)
MCH: 28.6 pg (ref 26.0–34.0)
MCHC: 31.7 g/dL (ref 30.0–36.0)
MCV: 90.1 fL (ref 80.0–100.0)
Monocytes Absolute: 0.5 10*3/uL (ref 0.1–1.0)
Monocytes Relative: 8 %
Neutro Abs: 5.3 10*3/uL (ref 1.7–7.7)
Neutrophils Relative %: 78 %
Platelets: 129 10*3/uL — ABNORMAL LOW (ref 150–400)
RBC: 3.43 MIL/uL — ABNORMAL LOW (ref 3.87–5.11)
RDW: 13.8 % (ref 11.5–15.5)
WBC: 6.9 10*3/uL (ref 4.0–10.5)
nRBC: 0 % (ref 0.0–0.2)

## 2019-03-10 LAB — URINALYSIS, ROUTINE W REFLEX MICROSCOPIC
Bilirubin Urine: NEGATIVE
Glucose, UA: >=500 mg/dL — AB
Hgb urine dipstick: NEGATIVE
Ketones, ur: NEGATIVE mg/dL
Nitrite: NEGATIVE
Protein, ur: NEGATIVE mg/dL
Specific Gravity, Urine: 1.01 (ref 1.005–1.030)
pH: 5 (ref 5.0–8.0)

## 2019-03-10 LAB — CBG MONITORING, ED
Glucose-Capillary: 361 mg/dL — ABNORMAL HIGH (ref 70–99)
Glucose-Capillary: 118 mg/dL — ABNORMAL HIGH (ref 70–99)
Glucose-Capillary: 48 mg/dL — ABNORMAL LOW (ref 70–99)
Glucose-Capillary: 48 mg/dL — ABNORMAL LOW (ref 70–99)
Glucose-Capillary: 99 mg/dL (ref 70–99)

## 2019-03-10 LAB — SARS CORONAVIRUS 2 BY RT PCR (HOSPITAL ORDER, PERFORMED IN ~~LOC~~ HOSPITAL LAB): SARS Coronavirus 2: NEGATIVE

## 2019-03-10 LAB — GLUCOSE, CAPILLARY
Glucose-Capillary: 140 mg/dL — ABNORMAL HIGH (ref 70–99)
Glucose-Capillary: 388 mg/dL — ABNORMAL HIGH (ref 70–99)
Glucose-Capillary: 145 mg/dL — ABNORMAL HIGH (ref 70–99)
Glucose-Capillary: 172 mg/dL — ABNORMAL HIGH (ref 70–99)

## 2019-03-10 LAB — LACTIC ACID, PLASMA: Lactic Acid, Venous: 1.1 mmol/L (ref 0.5–1.9)

## 2019-03-10 LAB — HEMOGLOBIN A1C
Hgb A1c MFr Bld: 6.8 % — ABNORMAL HIGH (ref 4.8–5.6)
Mean Plasma Glucose: 148.46 mg/dL

## 2019-03-10 LAB — LIPASE, BLOOD: Lipase: 27 U/L (ref 11–51)

## 2019-03-10 MED ORDER — LEVOTHYROXINE SODIUM 50 MCG PO TABS
50.0000 ug | ORAL_TABLET | Freq: Every day | ORAL | Status: DC
  Administered 2019-03-11 – 2019-03-16 (×6): 50 ug via ORAL
  Filled 2019-03-10 (×6): qty 1

## 2019-03-10 MED ORDER — PREGABALIN 75 MG PO CAPS
75.0000 mg | ORAL_CAPSULE | Freq: Two times a day (BID) | ORAL | Status: DC
  Administered 2019-03-10 – 2019-03-16 (×12): 75 mg via ORAL
  Filled 2019-03-10 (×12): qty 1

## 2019-03-10 MED ORDER — ACETAMINOPHEN 650 MG RE SUPP: 650.0000 mg | Freq: Four times a day (QID) | RECTAL | Status: DC | PRN

## 2019-03-10 MED ORDER — SODIUM CHLORIDE 0.9% FLUSH: 3.0000 mL | Freq: Two times a day (BID) | INTRAVENOUS | Status: DC

## 2019-03-10 MED ORDER — SODIUM CHLORIDE 0.9 % IV SOLN
500.0000 mg | Freq: Once | INTRAVENOUS | Status: AC
  Administered 2019-03-10: 16:00:00 500 mg via INTRAVENOUS
  Filled 2019-03-10: qty 500

## 2019-03-10 MED ORDER — ONDANSETRON HCL 4 MG PO TABS: 4.0000 mg | ORAL_TABLET | Freq: Four times a day (QID) | ORAL | Status: DC | PRN

## 2019-03-10 MED ORDER — GUAIFENESIN-DM 100-10 MG/5ML PO SYRP: 10.0000 mL | ORAL_SOLUTION | ORAL | Status: DC | PRN

## 2019-03-10 MED ORDER — HYDROCODONE-ACETAMINOPHEN 5-325 MG PO TABS
1.0000 | ORAL_TABLET | ORAL | Status: DC | PRN
  Administered 2019-03-10 – 2019-03-11 (×2): 1 via ORAL
  Filled 2019-03-10 (×2): qty 1

## 2019-03-10 MED ORDER — IBUPROFEN 800 MG PO TABS
800.0000 mg | ORAL_TABLET | Freq: Once | ORAL | Status: AC
  Administered 2019-03-10: 08:00:00 800 mg via ORAL
  Filled 2019-03-10: qty 1

## 2019-03-10 MED ORDER — SERTRALINE HCL 100 MG PO TABS
100.0000 mg | ORAL_TABLET | Freq: Every day | ORAL | Status: DC
  Administered 2019-03-11 – 2019-03-16 (×6): 100 mg via ORAL
  Filled 2019-03-10 (×6): qty 1

## 2019-03-10 MED ORDER — ACETAMINOPHEN 500 MG PO TABS
1000.0000 mg | ORAL_TABLET | Freq: Once | ORAL | Status: AC
  Administered 2019-03-10: 1000 mg via ORAL
  Filled 2019-03-10: qty 2

## 2019-03-10 MED ORDER — ONDANSETRON HCL 4 MG PO TABS
4.0000 mg | ORAL_TABLET | Freq: Four times a day (QID) | ORAL | Status: DC | PRN
  Administered 2019-03-11 – 2019-03-12 (×2): 4 mg via ORAL
  Filled 2019-03-10 (×2): qty 1

## 2019-03-10 MED ORDER — SODIUM CHLORIDE 0.9 % IV BOLUS
1000.0000 mL | Freq: Once | INTRAVENOUS | Status: AC
  Administered 2019-03-10: 1000 mL via INTRAVENOUS

## 2019-03-10 MED ORDER — SODIUM CHLORIDE 0.9 % IV SOLN
2.0000 g | INTRAVENOUS | Status: DC
  Administered 2019-03-11 – 2019-03-12 (×2): 2 g via INTRAVENOUS
  Filled 2019-03-10: qty 2
  Filled 2019-03-10: qty 20

## 2019-03-10 MED ORDER — SODIUM CHLORIDE 0.9 % IV BOLUS (SEPSIS)
1000.0000 mL | Freq: Once | INTRAVENOUS | Status: AC
  Administered 2019-03-10: 1000 mL via INTRAVENOUS

## 2019-03-10 MED ORDER — SODIUM CHLORIDE 0.9 % IV SOLN: INTRAVENOUS | Status: DC

## 2019-03-10 MED ORDER — PREDNISOLONE ACETATE 1 % OP SUSP
1.0000 [drp] | Freq: Two times a day (BID) | OPHTHALMIC | Status: DC
  Administered 2019-03-10 – 2019-03-15 (×8): 1 [drp] via OPHTHALMIC
  Filled 2019-03-10: qty 5

## 2019-03-10 MED ORDER — VITAMIN C 500 MG PO TABS
500.0000 mg | ORAL_TABLET | Freq: Every day | ORAL | Status: DC
  Administered 2019-03-10 – 2019-03-11 (×2): 500 mg via ORAL
  Filled 2019-03-10 (×2): qty 1

## 2019-03-10 MED ORDER — ENOXAPARIN SODIUM 30 MG/0.3ML ~~LOC~~ SOLN: 30.0000 mg | SUBCUTANEOUS | Status: DC

## 2019-03-10 MED ORDER — ONDANSETRON HCL 4 MG/2ML IJ SOLN: 4.0000 mg | Freq: Four times a day (QID) | INTRAMUSCULAR | Status: DC | PRN

## 2019-03-10 MED ORDER — DORZOLAMIDE HCL-TIMOLOL MAL 2-0.5 % OP SOLN
1.0000 [drp] | Freq: Two times a day (BID) | OPHTHALMIC | Status: DC
  Administered 2019-03-10 – 2019-03-15 (×8): 1 [drp] via OPHTHALMIC
  Filled 2019-03-10: qty 10

## 2019-03-10 MED ORDER — LATANOPROST 0.005 % OP SOLN
1.0000 [drp] | Freq: Every evening | OPHTHALMIC | Status: DC
  Administered 2019-03-10 – 2019-03-15 (×6): 1 [drp] via OPHTHALMIC
  Filled 2019-03-10: qty 2.5

## 2019-03-10 MED ORDER — HYDROCOD POLST-CPM POLST ER 10-8 MG/5ML PO SUER: 5.0000 mL | Freq: Two times a day (BID) | ORAL | Status: DC | PRN

## 2019-03-10 MED ORDER — ACETAMINOPHEN 325 MG PO TABS: 650.0000 mg | ORAL_TABLET | Freq: Four times a day (QID) | ORAL | Status: DC | PRN

## 2019-03-10 MED ORDER — NOREPINEPHRINE BITARTRATE 1 MG/ML IV SOLN
0.0000 ug/min | INTRAVENOUS | Status: DC
  Filled 2019-03-10: qty 4

## 2019-03-10 MED ORDER — CLOPIDOGREL BISULFATE 75 MG PO TABS
75.0000 mg | ORAL_TABLET | Freq: Every day | ORAL | Status: DC
  Administered 2019-03-11 – 2019-03-16 (×6): 75 mg via ORAL
  Filled 2019-03-10 (×6): qty 1

## 2019-03-10 MED ORDER — SODIUM CHLORIDE 0.9 % IV SOLN
INTRAVENOUS | Status: AC
  Administered 2019-03-10 – 2019-03-11 (×2): via INTRAVENOUS

## 2019-03-10 MED ORDER — ALBUTEROL SULFATE HFA 108 (90 BASE) MCG/ACT IN AERS
2.0000 | INHALATION_SPRAY | Freq: Four times a day (QID) | RESPIRATORY_TRACT | Status: DC
  Administered 2019-03-10 – 2019-03-11 (×2): 2 via RESPIRATORY_TRACT
  Filled 2019-03-10: qty 6.7

## 2019-03-10 MED ORDER — ATROPINE SULFATE 1 % OP SOLN
1.0000 [drp] | Freq: Two times a day (BID) | OPHTHALMIC | Status: DC
  Administered 2019-03-10 – 2019-03-13 (×7): 1 [drp] via OPHTHALMIC
  Filled 2019-03-10: qty 2

## 2019-03-10 MED ORDER — ENOXAPARIN SODIUM 30 MG/0.3ML ~~LOC~~ SOLN
30.0000 mg | Freq: Every day | SUBCUTANEOUS | Status: DC
  Administered 2019-03-10 – 2019-03-11 (×2): 30 mg via SUBCUTANEOUS
  Filled 2019-03-10 (×2): qty 0.3

## 2019-03-10 MED ORDER — SODIUM CHLORIDE 0.9 % IV SOLN
500.0000 mg | INTRAVENOUS | Status: DC
  Administered 2019-03-11: 14:00:00 500 mg via INTRAVENOUS
  Filled 2019-03-10 (×2): qty 500

## 2019-03-10 MED ORDER — PANTOPRAZOLE SODIUM 40 MG PO TBEC
40.0000 mg | DELAYED_RELEASE_TABLET | Freq: Every day | ORAL | Status: DC
  Administered 2019-03-11 – 2019-03-16 (×6): 40 mg via ORAL
  Filled 2019-03-10 (×6): qty 1

## 2019-03-10 MED ORDER — SENNA 8.6 MG PO TABS: 1.0000 | ORAL_TABLET | Freq: Two times a day (BID) | ORAL | Status: DC

## 2019-03-10 MED ORDER — SODIUM CHLORIDE 0.9 % IV SOLN
2.0000 g | INTRAVENOUS | Status: AC
  Administered 2019-03-10: 2 g via INTRAVENOUS
  Filled 2019-03-10: qty 20

## 2019-03-10 MED ORDER — INSULIN ASPART 100 UNIT/ML ~~LOC~~ SOLN: 0.0000 [IU] | Freq: Every day | SUBCUTANEOUS | Status: DC

## 2019-03-10 MED ORDER — SORBITOL 70 % SOLN
30.0000 mL | Freq: Every day | Status: DC | PRN
  Filled 2019-03-10: qty 30

## 2019-03-10 MED ORDER — INSULIN ASPART 100 UNIT/ML ~~LOC~~ SOLN: 0.0000 [IU] | Freq: Three times a day (TID) | SUBCUTANEOUS | Status: DC

## 2019-03-10 MED ORDER — ATORVASTATIN CALCIUM 40 MG PO TABS
80.0000 mg | ORAL_TABLET | Freq: Every day | ORAL | Status: DC
  Administered 2019-03-10 – 2019-03-16 (×7): 80 mg via ORAL
  Filled 2019-03-10 (×7): qty 2

## 2019-03-10 MED ORDER — ZINC SULFATE 220 (50 ZN) MG PO CAPS
220.0000 mg | ORAL_CAPSULE | Freq: Every day | ORAL | Status: DC
  Administered 2019-03-10 – 2019-03-11 (×2): 220 mg via ORAL
  Filled 2019-03-10 (×2): qty 1

## 2019-03-10 MED ORDER — ASPIRIN EC 81 MG PO TBEC
81.0000 mg | DELAYED_RELEASE_TABLET | Freq: Every day | ORAL | Status: DC
  Administered 2019-03-11 – 2019-03-16 (×6): 81 mg via ORAL
  Filled 2019-03-10 (×6): qty 1

## 2019-03-10 NOTE — ED Notes (Signed)
Bed: [facility] Expected date:  Expected time:  Means of arrival:  Comments: 

## 2019-03-10 NOTE — ED Notes (Signed)
Made Dr Tyrone Nine aware of patient's BP 87/45 and temp 99.9.

## 2019-03-10 NOTE — ED Provider Notes (Addendum)
[facility] DEPT Provider Note   CSN: [id number] Arrival date & time: 03/10/19  0730    History   Chief Complaint Chief Complaint  Patient presents with   Fever   Cough    HPI Deanna Landry is a 48 y.o. female.     48 yo F at behavioral health for suicidal ideation was noted to have a fever this morning when she woke up.  Patient states that she got her pneumonia vaccine yesterday and she thinks maybe that is the cause of her symptoms.  She is having some soreness to the right shoulder and having fevers and chills nausea and myalgias.  She denies cough or congestion denies vomiting or diarrhea.  Denies abdominal pain.  She has had increased urinary frequency denies dysuria flank pain or hesitancy.  She thinks the increased frequency is likely due to her poorly controlled diabetes.  She denies wounds.  No noted sick contacts.  No recent travel.  The history is provided by the patient.  Fever  Associated symptoms: cough and myalgias   Associated symptoms: no chest pain, no chills, no congestion, no dysuria, no headaches, no nausea, no rhinorrhea and no vomiting   Cough  Associated symptoms: fever and myalgias   Associated symptoms: no chest pain, no chills, no headaches, no rhinorrhea, no shortness of breath and no wheezing   Illness  Severity:  Mild Onset quality:  Sudden Duration:  2 hours Timing:  Constant Progression:  Unchanged Chronicity:  New Associated symptoms: cough, fever and myalgias   Associated symptoms: no chest pain, no congestion, no headaches, no nausea, no rhinorrhea, no shortness of breath, no vomiting and no wheezing     Past Medical History:  Diagnosis Date   CHF (congestive heart failure) (HCC)    Diabetes mellitus without complication ([place])    Hypertension    Myocardial infarct, old    Renal disorder    Stroke [facility])     Patient Active Problem List   Diagnosis Date Noted   MDD (major depressive disorder),  severe ([place]) 03/09/2019   Major depression, recurrent ([place]) 03/09/2019    Past Surgical History:  Procedure Laterality Date   ABDOMINAL HYSTERECTOMY     EYE SURGERY     TOE AMPUTATION       OB History   No obstetric history on file.      Home Medications    Prior to Admission medications   Medication Sig Start Date End Date Taking? Authorizing Provider  amLODipine (NORVASC) 10 MG tablet Take 10 mg by mouth daily. 01/15/19  Yes [provider]  aspirin EC 81 MG tablet Take 81 mg by mouth daily.   Yes [provider]  atorvastatin (LIPITOR) 80 MG tablet Take 80 mg by mouth daily. 12/28/18  Yes [provider]  atropine 1 % ophthalmic solution Place 1 drop into the right eye 2 (two) times daily. 01/15/19  Yes [provider]  buPROPion (WELLBUTRIN XL) 150 MG 24 hr tablet Take 150 mg by mouth daily. 03/02/19  Yes [provider]  clopidogrel (PLAVIX) 75 MG tablet Take 75 mg by mouth daily. 01/15/19  Yes [provider]  dorzolamide-timolol (COSOPT) 22.3-6.8 MG/ML ophthalmic solution Place 1 drop into the left eye 2 (two) times daily. 03/06/19  Yes [provider]  furosemide (LASIX) 20 MG tablet Take 20 mg by mouth daily. 02/02/19  Yes [provider]  hydrALAZINE (APRESOLINE) 50 MG tablet Take 50 mg by mouth 3 (  three) times daily.   Yes [provider]  isosorbide dinitrate (ISORDIL) 20 MG tablet Take 20 mg by mouth 2 (two) times daily. 01/27/19  Yes [provider]  latanoprost (XALATAN) 0.005 % ophthalmic solution Place 1 drop into both eyes every evening. 01/23/19  Yes [provider]  LEVEMIR 100 UNIT/ML injection Inject 30 Units into the skin 2 (two) times daily.  12/07/18  Yes [provider]  levothyroxine (SYNTHROID) 50 MCG tablet Take 50 mcg by mouth daily. 02/02/19  Yes [provider]  lisinopril (ZESTRIL) 20 MG tablet Take 20 mg by mouth daily. 12/21/18  Yes [provider]  NOVOLOG FLEXPEN 100 UNIT/ML FlexPen Inject 10 Units into the skin 3 (three) times daily. 01/23/19  Yes [provider]  pantoprazole (PROTONIX) 40 MG tablet Take 40 mg by mouth daily. 12/29/18  Yes [provider]  prednisoLONE acetate (PRED FORTE) 1 % ophthalmic suspension Place 1 drop into the left eye 2 (two) times daily. 12/15/18  Yes [provider]  pregabalin (LYRICA) 75 MG capsule Take 75 mg by mouth 2 (two) times daily. 02/11/19  Yes [provider]  sertraline (ZOLOFT) 100 MG tablet Take 100 mg by mouth daily. 01/27/19  Yes [provider]  TRULICITY 1.5 YJ/8.5UD SOPN Inject 1.5 mg into the skin once a week. 02/07/19  Yes [provider]    Family History No family history on file.  Social History Social History   Tobacco Use   Smoking status: Never Smoker   Smokeless tobacco: Never Used  Substance Use Topics   Alcohol use: Not Currently   Drug use: Not Currently     Allergies   Patient has no known allergies.   Review of Systems Review of Systems  Constitutional: Positive for fever. Negative for chills.  HENT: Negative for congestion and rhinorrhea.   Eyes: Negative for redness and visual disturbance.  Respiratory: Positive for cough. Negative for shortness of breath and wheezing.   Cardiovascular: Negative for chest pain and palpitations.  Gastrointestinal: Negative for nausea and vomiting.  Genitourinary: Positive for frequency. Negative for dysuria and urgency.  Musculoskeletal: Positive for myalgias. Negative for arthralgias.  Skin: Negative for pallor and wound.  Neurological: Negative for dizziness and headaches.     Physical Exam Updated Vital Signs BP (!) 99/51    Pulse 72    Temp 99.9 F (37.7 C) (Oral)    Resp 19    Ht 5\' 2"  (1.575 m)    Wt 83.5 kg    LMP  (LMP Unknown)    SpO2 93%    BMI 33.65 kg/m   Physical Exam Vitals signs and nursing note reviewed.  Constitutional:       General: She is not in acute distress.    Appearance: She is well-developed. She is not diaphoretic.  HENT:     Head: Normocephalic and atraumatic.     Comments: Swollen turbinates, posterior nasal drip, no noted sinus ttp, tm normal bilaterally.   Eyes:     Pupils: Pupils are equal, round, and reactive to light.  Neck:     Musculoskeletal: Normal range of motion and neck supple.  Cardiovascular:     Rate and Rhythm: Normal rate and regular rhythm.     Heart sounds: No murmur. No friction rub. No gallop.   Pulmonary:     Effort: Pulmonary effort is normal.     Breath sounds: No wheezing or rales.  Abdominal:     General: There  is no distension.     Palpations: Abdomen is soft.     Tenderness: There is no abdominal tenderness.  Musculoskeletal:        General: No tenderness.  Skin:    General: Skin is warm and dry.  Neurological:     Mental Status: She is alert and oriented to person, place, and time.  Psychiatric:        Behavior: Behavior normal.      ED Treatments / Results  Labs (all labs ordered are listed, but only abnormal results are displayed) Labs Reviewed  GLUCOSE, CAPILLARY - Abnormal; Notable for the following components:      Result Value   Glucose-Capillary 277 (*)    All other components within normal limits  GLUCOSE, CAPILLARY - Abnormal; Notable for the following components:   Glucose-Capillary 25 (*)    All other components within normal limits  GLUCOSE, CAPILLARY - Abnormal; Notable for the following components:   Glucose-Capillary 35 (*)    All other components within normal limits  GLUCOSE, CAPILLARY - Abnormal; Notable for the following components:   Glucose-Capillary 44 (*)    All other components within normal limits  GLUCOSE, CAPILLARY - Abnormal; Notable for the following components:   Glucose-Capillary 44 (*)    All other components within normal limits  GLUCOSE, CAPILLARY - Abnormal; Notable for the following components:    Glucose-Capillary 111 (*)    All other components within normal limits  GLUCOSE, CAPILLARY - Abnormal; Notable for the following components:   Glucose-Capillary 140 (*)    All other components within normal limits  GLUCOSE, CAPILLARY - Abnormal; Notable for the following components:   Glucose-Capillary 388 (*)    All other components within normal limits  URINALYSIS, ROUTINE W REFLEX MICROSCOPIC - Abnormal; Notable for the following components:   Glucose, UA >=500 (*)    Leukocytes,Ua TRACE (*)    Bacteria, UA RARE (*)    All other components within normal limits  CBC WITH DIFFERENTIAL/PLATELET - Abnormal; Notable for the following components:   RBC 3.43 (*)    Hemoglobin 9.8 (*)    HCT 30.9 (*)    Platelets 129 (*)    All other components within normal limits  COMPREHENSIVE METABOLIC PANEL - Abnormal; Notable for the following components:   Glucose, Bld 133 (*)    BUN 64 (*)    Creatinine, Ser 2.95 (*)    Calcium 8.8 (*)    AST 9 (*)    GFR calc non Af Amer 18 (*)    GFR calc Af Amer 21 (*)    All other components within normal limits  CBG MONITORING, ED - Abnormal; Notable for the following components:   Glucose-Capillary 361 (*)    All other components within normal limits  CBG MONITORING, ED - Abnormal; Notable for the following components:   Glucose-Capillary 118 (*)    All other components within normal limits  CULTURE, BLOOD (ROUTINE X 2)  CULTURE, BLOOD (ROUTINE X 2)  URINE CULTURE  LIPASE, BLOOD  LACTIC ACID, PLASMA  HEMOGLOBIN A1C  TSH  COMPREHENSIVE METABOLIC PANEL    EKG None  Radiology Dg Chest Port 1 View  Result Date: 03/10/2019 CLINICAL DATA:  Fever EXAM: PORTABLE CHEST 1 VIEW COMPARISON:  None. FINDINGS: Lungs are clear. Heart size and pulmonary vascularity are within normal limits. No adenopathy. No bone lesions. IMPRESSION: No edema or consolidation. Electronically Signed   By: Lowella Grip III M.D.   On: 03/10/2019 11:34  Procedures Procedures (including critical care time)  Medications Ordered in ED Medications  insulin aspart (novoLOG) injection 0-15 Units (0 Units Subcutaneous Not Given 03/10/19 1301)  insulin aspart (novoLOG) injection 0-5 Units (5 Units Subcutaneous Given 03/10/19 0821)  acetaminophen (TYLENOL) tablet 650 mg (has no administration in time range)  alum & mag hydroxide-simeth (MAALOX/MYLANTA) 200-200-20 MG/5ML suspension 30 mL (has no administration in time range)  magnesium hydroxide (MILK OF MAGNESIA) suspension 30 mL (has no administration in time range)  hydrOXYzine (ATARAX/VISTARIL) tablet 25 mg (25 mg Oral Given 03/09/19 1754)  traZODone (DESYREL) tablet 50 mg (has no administration in time range)  amLODipine (NORVASC) tablet 10 mg (10 mg Oral Given 03/10/19 0818)  LORazepam (ATIVAN) tablet 1 mg (has no administration in time range)  atorvastatin (LIPITOR) tablet 80 mg (80 mg Oral Given 03/09/19 1749)  buPROPion (WELLBUTRIN XL) 24 hr tablet 300 mg (300 mg Oral Given 03/10/19 0818)  clopidogrel (PLAVIX) tablet 75 mg (75 mg Oral Given 03/10/19 0843)  dorzolamide-timolol (COSOPT) 22.3-6.8 MG/ML ophthalmic solution 1 drop (0 drops Both Eyes Hold 03/10/19 0838)  furosemide (LASIX) tablet 20 mg (20 mg Oral Given 03/10/19 0817)  insulin aspart (novoLOG) injection 10 Units (0 Units Subcutaneous Hold 03/10/19 1329)  insulin detemir (LEVEMIR) injection 30 Units (30 Units Subcutaneous Given 03/10/19 0841)  isosorbide dinitrate (ISORDIL) tablet 20 mg (20 mg Oral Given 03/10/19 0843)  latanoprost (XALATAN) 0.005 % ophthalmic solution 1 drop (1 drop Both Eyes Given 03/09/19 2224)  levothyroxine (SYNTHROID) tablet 50 mcg (50 mcg Oral Given 03/10/19 0641)  pantoprazole (PROTONIX) EC tablet 40 mg (40 mg Oral Given 03/10/19 0817)  pregabalin (LYRICA) capsule 75 mg (75 mg Oral Given 03/10/19 0817)  sertraline (ZOLOFT) tablet 100 mg (100 mg Oral Given 03/10/19 0817)  ARIPiprazole (ABILIFY) tablet 5 mg (5 mg Oral  Given 03/09/19 2224)  azithromycin (ZITHROMAX) 500 mg in sodium chloride 0.9 % 250 mL IVPB (has no administration in time range)  dextrose (GLUTOSE) 40 % oral gel (37.5 g  Given 03/09/19 2225)  acetaminophen (TYLENOL) tablet 1,000 mg (1,000 mg Oral Given 03/10/19 0758)  ibuprofen (ADVIL) tablet 800 mg (800 mg Oral Given 03/10/19 0759)  sodium chloride 0.9 % bolus 1,000 mL (0 mLs Intravenous Stopped 03/10/19 1433)    And  sodium chloride 0.9 % bolus 1,000 mL (0 mLs Intravenous Stopped 03/10/19 1433)    And  sodium chloride 0.9 % bolus 1,000 mL (1,000 mLs Intravenous New Bag/Given 03/10/19 1336)  cefTRIAXone (ROCEPHIN) 2 g in sodium chloride 0.9 % 100 mL IVPB (0 g Intravenous Stopped 03/10/19 1315)  sodium chloride 0.9 % bolus 1,000 mL (1,000 mLs Intravenous New Bag/Given (Non-Interop) 03/10/19 1446)     Initial Impression / Assessment and Plan / ED Course  I have reviewed the triage vital signs and the nursing notes.  Pertinent labs & imaging results that were available during my care of the patient were reviewed by me and considered in my medical decision making (see chart for details).        48 yo F with a chief complaint of fever.  Patient states while she was trying to sleep last night she felt subjectively hot and cold and achy.  Was noted to have a temperature of 103 this morning.  Patient has no upper respiratory symptoms, she has had some increased urinary frequency will obtain a UA.  She is clear lung sounds for me.  Most likely this is a viral illness.  Unfortunately this is occurring during the novel  coronavirus pandemic.  I feel testing at this point would be unlikely to be helpful as the patient has had less than 12 hours of symptoms.   UA negative for infection.  D/c back to behavioral health.    I had put the patient in for discharge and took some time to arrange for transportation to get her back to behavioral health.  When her blood pressure was rechecked it was notified by the  nurse that it was 87/45.  I will call code sepsis and we will give the patient 30 cc/kg of IV fluids.  Send off a lactate now obtained a portable chest x-ray.  Patient's lactate is normal.  Chest x-ray viewed by me without focal infiltrate.  No significant anemia or electrolyte issues.  The patient does have some renal dysfunction that sounds like it is chronic based on a discussion with the patient.  She does continue to feel very fatigued and has blood pressure that is remain low.  She is given her 30 cc/kg of IV fluids with improvement with maps now above 65.  I will discuss with hospitalist for admission.  CRITICAL CARE Performed by: Cecilio Asper   Total critical care time: 80 minutes  Critical care time was exclusive of separately billable procedures and treating other patients.  Critical care was necessary to treat or prevent imminent or life-threatening deterioration.  Critical care was time spent personally by me on the following activities: development of treatment plan with patient and/or surrogate as well as nursing, discussions with consultants, evaluation of patient's response to treatment, examination of patient, obtaining history from patient or surrogate, ordering and performing treatments and interventions, ordering and review of laboratory studies, ordering and review of radiographic studies, pulse oximetry and re-evaluation of patient's condition.   Medications given during this visit Medications  insulin aspart (novoLOG) injection 0-15 Units (0 Units Subcutaneous Not Given 03/10/19 1301)  insulin aspart (novoLOG) injection 0-5 Units (5 Units Subcutaneous Given 03/10/19 0821)  acetaminophen (TYLENOL) tablet 650 mg (has no administration in time range)  alum & mag hydroxide-simeth (MAALOX/MYLANTA) 200-200-20 MG/5ML suspension 30 mL (has no administration in time range)  magnesium hydroxide (MILK OF MAGNESIA) suspension 30 mL (has no administration in time range)   hydrOXYzine (ATARAX/VISTARIL) tablet 25 mg (25 mg Oral Given 03/09/19 1754)  traZODone (DESYREL) tablet 50 mg (has no administration in time range)  amLODipine (NORVASC) tablet 10 mg (10 mg Oral Given 03/10/19 0818)  LORazepam (ATIVAN) tablet 1 mg (has no administration in time range)  atorvastatin (LIPITOR) tablet 80 mg (80 mg Oral Given 03/09/19 1749)  buPROPion (WELLBUTRIN XL) 24 hr tablet 300 mg (300 mg Oral Given 03/10/19 0818)  clopidogrel (PLAVIX) tablet 75 mg (75 mg Oral Given 03/10/19 0843)  dorzolamide-timolol (COSOPT) 22.3-6.8 MG/ML ophthalmic solution 1 drop (0 drops Both Eyes Hold 03/10/19 0838)  furosemide (LASIX) tablet 20 mg (20 mg Oral Given 03/10/19 0817)  insulin aspart (novoLOG) injection 10 Units (0 Units Subcutaneous Hold 03/10/19 1329)  insulin detemir (LEVEMIR) injection 30 Units (30 Units Subcutaneous Given 03/10/19 0841)  isosorbide dinitrate (ISORDIL) tablet 20 mg (20 mg Oral Given 03/10/19 0843)  latanoprost (XALATAN) 0.005 % ophthalmic solution 1 drop (1 drop Both Eyes Given 03/09/19 2224)  levothyroxine (SYNTHROID) tablet 50 mcg (50 mcg Oral Given 03/10/19 0641)  pantoprazole (PROTONIX) EC tablet 40 mg (40 mg Oral Given 03/10/19 0817)  pregabalin (LYRICA) capsule 75 mg (75 mg Oral Given 03/10/19 0817)  sertraline (ZOLOFT) tablet 100 mg (100 mg Oral  Given 03/10/19 0817)  ARIPiprazole (ABILIFY) tablet 5 mg (5 mg Oral Given 03/09/19 2224)  azithromycin (ZITHROMAX) 500 mg in sodium chloride 0.9 % 250 mL IVPB (has no administration in time range)  dextrose (GLUTOSE) 40 % oral gel (37.5 g  Given 03/09/19 2225)  acetaminophen (TYLENOL) tablet 1,000 mg (1,000 mg Oral Given 03/10/19 0758)  ibuprofen (ADVIL) tablet 800 mg (800 mg Oral Given 03/10/19 0759)  sodium chloride 0.9 % bolus 1,000 mL (0 mLs Intravenous Stopped 03/10/19 1433)    And  sodium chloride 0.9 % bolus 1,000 mL (0 mLs Intravenous Stopped 03/10/19 1433)    And  sodium chloride 0.9 % bolus 1,000 mL (1,000 mLs Intravenous New  Bag/Given 03/10/19 1336)  cefTRIAXone (ROCEPHIN) 2 g in sodium chloride 0.9 % 100 mL IVPB (0 g Intravenous Stopped 03/10/19 1315)  sodium chloride 0.9 % bolus 1,000 mL (1,000 mLs Intravenous New Bag/Given (Non-Interop) 03/10/19 1446)        Medications given during this visit Medications  insulin aspart (novoLOG) injection 0-15 Units (0 Units Subcutaneous Not Given 03/10/19 1301)  insulin aspart (novoLOG) injection 0-5 Units (5 Units Subcutaneous Given 03/10/19 0821)  acetaminophen (TYLENOL) tablet 650 mg (has no administration in time range)  alum & mag hydroxide-simeth (MAALOX/MYLANTA) 200-200-20 MG/5ML suspension 30 mL (has no administration in time range)  magnesium hydroxide (MILK OF MAGNESIA) suspension 30 mL (has no administration in time range)  hydrOXYzine (ATARAX/VISTARIL) tablet 25 mg (25 mg Oral Given 03/09/19 1754)  traZODone (DESYREL) tablet 50 mg (has no administration in time range)  amLODipine (NORVASC) tablet 10 mg (10 mg Oral Given 03/10/19 0818)  LORazepam (ATIVAN) tablet 1 mg (has no administration in time range)  atorvastatin (LIPITOR) tablet 80 mg (80 mg Oral Given 03/09/19 1749)  buPROPion (WELLBUTRIN XL) 24 hr tablet 300 mg (300 mg Oral Given 03/10/19 0818)  clopidogrel (PLAVIX) tablet 75 mg (75 mg Oral Given 03/10/19 0843)  dorzolamide-timolol (COSOPT) 22.3-6.8 MG/ML ophthalmic solution 1 drop (0 drops Both Eyes Hold 03/10/19 0838)  furosemide (LASIX) tablet 20 mg (20 mg Oral Given 03/10/19 0817)  insulin aspart (novoLOG) injection 10 Units (0 Units Subcutaneous Hold 03/10/19 1329)  insulin detemir (LEVEMIR) injection 30 Units (30 Units Subcutaneous Given 03/10/19 0841)  isosorbide dinitrate (ISORDIL) tablet 20 mg (20 mg Oral Given 03/10/19 0843)  latanoprost (XALATAN) 0.005 % ophthalmic solution 1 drop (1 drop Both Eyes Given 03/09/19 2224)  levothyroxine (SYNTHROID) tablet 50 mcg (50 mcg Oral Given 03/10/19 0641)  pantoprazole (PROTONIX) EC tablet 40 mg (40 mg Oral Given  03/10/19 0817)  pregabalin (LYRICA) capsule 75 mg (75 mg Oral Given 03/10/19 0817)  sertraline (ZOLOFT) tablet 100 mg (100 mg Oral Given 03/10/19 0817)  ARIPiprazole (ABILIFY) tablet 5 mg (5 mg Oral Given 03/09/19 2224)  azithromycin (ZITHROMAX) 500 mg in sodium chloride 0.9 % 250 mL IVPB (has no administration in time range)  dextrose (GLUTOSE) 40 % oral gel (37.5 g  Given 03/09/19 2225)  acetaminophen (TYLENOL) tablet 1,000 mg (1,000 mg Oral Given 03/10/19 0758)  ibuprofen (ADVIL) tablet 800 mg (800 mg Oral Given 03/10/19 0759)  sodium chloride 0.9 % bolus 1,000 mL (0 mLs Intravenous Stopped 03/10/19 1433)    And  sodium chloride 0.9 % bolus 1,000 mL (0 mLs Intravenous Stopped 03/10/19 1433)    And  sodium chloride 0.9 % bolus 1,000 mL (1,000 mLs Intravenous New Bag/Given 03/10/19 1336)  cefTRIAXone (ROCEPHIN) 2 g in sodium chloride 0.9 % 100 mL IVPB (0 g Intravenous Stopped 03/10/19 1315)  sodium chloride 0.9 % bolus 1,000 mL (1,000 mLs Intravenous New Bag/Given (Non-Interop) 03/10/19 1446)      Final Clinical Impressions(s) / ED Diagnoses   Final diagnoses:  Fever in adult  Hypovolemic shock [facility])    ED Discharge Orders    None           Deno Etienne, DO 03/10/19 1523

## 2019-03-10 NOTE — ED Notes (Signed)
Bed: IN86 Expected date:  Expected time:  Means of arrival:  Comments: Hold for [facility]-coming from [facility]

## 2019-03-10 NOTE — Progress Notes (Signed)
D: Pt alert and oriented.  Pt denies experiencing any pain, SI/HI, or AVH at this time. Pt's BG level was at 25 at 2115. Protocol was followed until pt's CBG was WDL. Physician on call was notified of pt's condition. Physician order insulin be held and pt continue to be monitored.   In a 45 minute time span pt received a total of 3 tubes of dextrose (Glucose), 8 tablets, 16 oz of apple juice, one small cart of milk, and two small things of peanut butter (approx 2 table spoons). Pt stay alert and coherent throughout the full process.   A: Scheduled medications administered to pt, per MD orders. Support and encouragement provided. Frequent verbal contact made. Routine safety checks conducted q15 minutes.   R: No adverse drug reactions noted. Pt verbally contracts for safety at this time. Pt complaint with medications and treatment plan. Pt remains safe at this time. Will continue to monitor.

## 2019-03-10 NOTE — Plan of Care (Signed)
48 yo W hx of CKD and DM2,   Please see Dr. Pietro Cassis note from earlier but briefly patient Noted to be febrile up to 103.2 at behavioral health after admission for possible suicidal attempt with insulin Reported cough, no true body aches, some nausea no vomiting, no Diarrhea No sick contacts She goes out once every 2 weeks She does not smoke, no drinking   Had a flu shot and Pneumonia shot earlier and has been having shoulder pain  She denies taking her insulin reports her mom took away her insulin Her mother have been giving her insulin Her BG have been up up and down  Patient has been seen by me and reexamined Unfortunately earlier orders was put in under previous encounter number Admission orders have been reentered  Patient is being treated for sepsis underlying etiology at this point unclear COVID testing negative but given a cough and severe fever will repeat.  Evaluate for any other source of potential infection.  Continue IV antibiotics for now.  Given hypoglycemia we will continue to monitor blood sugars will need diabetic coordinator consult.  History of suicidal ideation order suicide precautions and behavioral health consult  Deanna Landry 12:12 AM

## 2019-03-10 NOTE — H&P (Signed)
[facility] History and Physical  Deanna Landry UTM:[id number] DOB: Jun 26, 1971 DOA: 03/09/2019 Referring physician: ED PCP: Cher Nakai, MD  Chief Complaint: Fever ------------------------------------------------------------------------------------------------------ Assessment/Plan: Active Problems:   MDD (major depressive disorder), severe ([place])   Major depression, recurrent ([place])  Fever  -Up to 103.2.  Reports mild upper respiratory symptoms.  Not on oxygen requirement.  Chest x-ray negative.  Lactic acid level normal.  Urinalysis negative. No clear source of fever at this time.  COVID-19 test pending. Continue contact and droplet isolation for now.  Hypotension - Blood pressure as low as 80s in the ED. - Patient has history of hypertension and prior to admission, patient was on amlodipine, Lasix, nitrate, hydralazine, lisinopril. Keep all blood pressure medicine on hold.  Resume when blood pressure stabilizes.  Acute kidney injury - Creatinine elevated at 2.95.  May have some degree of chronic kidney disease based on her risk factors, not sure of baseline creatinine.  Monitor renal function with IV hydration.  Diabetes mellitus with hypoglycemia -Longstanding history of diabetes mellitus.  Hypoglycemic to 25 last night.  Patient mentioned that she had suicidal ideation by injecting insulin.  I am not sure if she actually did it.  Her blood sugar level was 25 last night. Hyperglycemic today. Prior to admission, patient was on Trulicity 1.5 mg weekly, Levemir 30 units twice daily, NovoLog 10 units pre-meal.   We will resume Lantus at 15 units twice daily and start on sliding scale insulin with Accu-Cheks.  Titrate dose based on blood sugar response.  CAD status post MI/history of stroke -Continue aspirin. ?  Not on statin.  Suicidal ideation -Initially admitted to behavioral health yesterday.  We will continue suicidal precautions for now.  Likely discharge back to behavioral  health when medically appropriate. - Prior to admission, patient was on bupropion, Zoloft.  Continue the same.  Mobility: Encourage ambulation Diet: Cardiac/diabetic diet DVT prophylaxis:  Lovenox Code Status:  Full code Disposition Plan:  Likely back to behavioral health in the next 2 to 3 days  ----------------------------------------------------------------------------------------------------- History of Present Illness: Deanna Landry is a 48 y.o. female with past medical history of hypertension, diabetes mellitus, CHF, CAD status post MI, CKD, stroke was admitted to behavioral health Hospital yesterday 4/23 for suicidal ideation with plan to overdose on her insulin.   At behavioral health, last night around 9:15 PM, patient's blood glucose level was found to be low at 25.  Patient was given 3 amps of dextrose, apple juice with correction of blood sugar level. This morning, patient had a fever of 103.2 F.  She also reported occasional cough in last few days.  She was sent to the ED for further evaluation. Patient also reported chills and myalgia.  No cough, congestion.  She reported increased urinary frequency but states it could be because of her hyperglycemia.  In the ED, temperature noted to be 103, blood pressure eventually dropped down to 86/43.  Breathing comfortably on room air with oxygen saturation more than 95% most of the times. Labs showed WBC count normal at 6.9, hemoglobin low at 9.8, platelet 129, lactate normal at 1.1, sodium 138, potassium 5.1, BUN/creatinine 64/2.95. Chest x-ray did not show any edema or consolidation.  Review of Systems:  All systems were reviewed and were negative unless otherwise mentioned in the HPI  Past medical history: Past Medical History:  Diagnosis Date  . CHF (congestive heart failure) ([place])   . Diabetes mellitus without complication ([place])   . Hypertension   .  Myocardial infarct, old   . Renal disorder   . Stroke [facility])     Past  surgical history: Past Surgical History:  Procedure Laterality Date  . ABDOMINAL HYSTERECTOMY    . EYE SURGERY    . TOE AMPUTATION      Social History:  reports that she has never smoked. She has never used smokeless tobacco. She reports previous alcohol use. She reports previous drug use.  Allergies:  No Known Allergies  Family history:  No family history on file.   Home Meds: Prior to Admission medications   Medication Sig Start Date End Date Taking? Authorizing Provider  amLODipine (NORVASC) 10 MG tablet Take 10 mg by mouth daily. 01/15/19  Yes [provider]  aspirin EC 81 MG tablet Take 81 mg by mouth daily.   Yes [provider]  atorvastatin (LIPITOR) 80 MG tablet Take 80 mg by mouth daily. 12/28/18  Yes [provider]  atropine 1 % ophthalmic solution Place 1 drop into the right eye 2 (two) times daily. 01/15/19  Yes [provider]  buPROPion (WELLBUTRIN XL) 150 MG 24 hr tablet Take 150 mg by mouth daily. 03/02/19  Yes [provider]  clopidogrel (PLAVIX) 75 MG tablet Take 75 mg by mouth daily. 01/15/19  Yes [provider]  dorzolamide-timolol (COSOPT) 22.3-6.8 MG/ML ophthalmic solution Place 1 drop into the left eye 2 (two) times daily. 03/06/19  Yes [provider]  furosemide (LASIX) 20 MG tablet Take 20 mg by mouth daily. 02/02/19  Yes [provider]  hydrALAZINE (APRESOLINE) 50 MG tablet Take 50 mg by mouth 3 (three) times daily.   Yes [provider]  isosorbide dinitrate (ISORDIL) 20 MG tablet Take 20 mg by mouth 2 (two) times daily. 01/27/19  Yes [provider]  latanoprost (XALATAN) 0.005 % ophthalmic solution Place 1 drop into both eyes every evening. 01/23/19  Yes [provider]  LEVEMIR 100 UNIT/ML injection Inject 30 Units into the skin 2 (two) times daily.  12/07/18  Yes [provider]  levothyroxine (SYNTHROID) 50 MCG tablet Take 50 mcg by mouth daily. 02/02/19   Yes [provider]  lisinopril (ZESTRIL) 20 MG tablet Take 20 mg by mouth daily. 12/21/18  Yes [provider]  NOVOLOG FLEXPEN 100 UNIT/ML FlexPen Inject 10 Units into the skin 3 (three) times daily. 01/23/19  Yes [provider]  pantoprazole (PROTONIX) 40 MG tablet Take 40 mg by mouth daily. 12/29/18  Yes [provider]  prednisoLONE acetate (PRED FORTE) 1 % ophthalmic suspension Place 1 drop into the left eye 2 (two) times daily. 12/15/18  Yes [provider]  pregabalin (LYRICA) 75 MG capsule Take 75 mg by mouth 2 (two) times daily. 02/11/19  Yes [provider]  sertraline (ZOLOFT) 100 MG tablet Take 100 mg by mouth daily. 01/27/19  Yes [provider]  TRULICITY 1.5 FU/9.3AT SOPN Inject 1.5 mg into the skin once a week. 02/07/19  Yes [provider]    Physical Exam: Vitals:   03/10/19 1430 03/10/19 1441 03/10/19 1457 03/10/19 1500  BP: (!) 89/48 (!) 104/57 (!) 99/51 (!) 100/51  Pulse: 68 79 72 70  Resp: 16 17 19 20   Temp:      TempSrc:      SpO2: 95% 96% 93% 94%  Weight:      Height:       Wt Readings from Last 3 Encounters:  03/10/19 83.5 kg   Body mass index is  33.65 kg/m.  General exam: Appears calm and comfortable.  Lying on bed.  Not agitated. Skin: No rashes, lesions or ulcers. HEENT: Normal Lungs: Clear to auscultation bilaterally CVS: Regular rate and rhythm, no murmur GI/Abd soft, nontender, nondistended, bowel sound present CNS: Alert, awake, oriented x3 Psychiatry: Mood & affect appropriate.  Extremities: No pedal edema, no calf tenderness  Labs on Admission:   CBC: Recent Labs  Lab 03/10/19 1148  WBC 6.9  NEUTROABS 5.3  HGB 9.8*  HCT 30.9*  MCV 90.1  PLT 129*    Basic Metabolic Panel: Recent Labs  Lab 03/10/19 1148  NA 138  K 5.1  CL 107  CO2 23  GLUCOSE 133*  BUN 64*  CREATININE 2.95*  CALCIUM 8.8*    Liver Function Tests: Recent Labs  Lab 03/10/19 1148  AST 9*   ALT 10  ALKPHOS 106  BILITOT 0.4  PROT 6.9  ALBUMIN 3.6   Recent Labs  Lab 03/10/19 1148  LIPASE 27   No results for input(s): AMMONIA in the last 168 hours.  Cardiac Enzymes: No results for input(s): CKTOTAL, CKMB, CKMBINDEX, TROPONINI in the last 168 hours.  BNP (last 3 results) No results for input(s): BNP in the last 8760 hours.  ProBNP (last 3 results) No results for input(s): PROBNP in the last 8760 hours.  CBG: Recent Labs  Lab 03/09/19 2218 03/10/19 0117 03/10/19 0607 03/10/19 0802 03/10/19 1249  GLUCAP 111* 140* 388* 361* 118*    Lipase     Component Value Date/Time   LIPASE 27 03/10/2019 1148     Urinalysis    Component Value Date/Time   COLORURINE YELLOW 03/10/2019 0745   APPEARANCEUR CLEAR 03/10/2019 0745   LABSPEC 1.010 03/10/2019 0745   PHURINE 5.0 03/10/2019 0745   GLUCOSEU >=500 (A) 03/10/2019 0745   HGBUR NEGATIVE 03/10/2019 0745   BILIRUBINUR NEGATIVE 03/10/2019 0745   KETONESUR NEGATIVE 03/10/2019 0745   PROTEINUR NEGATIVE 03/10/2019 0745   NITRITE NEGATIVE 03/10/2019 0745   LEUKOCYTESUR TRACE (A) 03/10/2019 0745     Drugs of Abuse  No results found for: LABOPIA, COCAINSCRNUR, LABBENZ, AMPHETMU, THCU, LABBARB    Radiological Exams on Admission: Dg Chest Port 1 View  Result Date: 03/10/2019 CLINICAL DATA:  Fever EXAM: PORTABLE CHEST 1 VIEW COMPARISON:  None. FINDINGS: Lungs are clear. Heart size and pulmonary vascularity are within normal limits. No adenopathy. No bone lesions. IMPRESSION: No edema or consolidation. Electronically Signed   By: Lowella Grip III M.D.   On: 03/10/2019 11:34   ----------------------------------------------------------------------------------------------------------------------------------------------------------- Severity of Illness: The appropriate patient status for this patient is INPATIENT. Inpatient status is judged to be reasonable and necessary in order to provide the required intensity of  service to ensure the patient's safety. The patient's presenting symptoms, physical exam findings, and initial radiographic and laboratory data in the context of their chronic comorbidities is felt to place them at high risk for further clinical deterioration. Furthermore, it is not anticipated that the patient will be medically stable for discharge from the hospital within 2 midnights of admission. The following factors support the patient status of inpatient.   " The patient's presenting symptoms include hypoglycemia, fever. " The worrisome physical exam findings include fever. " The initial radiographic and laboratory data are worrisome because of AKI, hyperglycemia. " The chronic co-morbidities include diabetes, CKD, suicidal ideation.   * I certify that at the point of admission it is my clinical judgment that the patient will require inpatient hospital care spanning beyond 2  midnights from the point of admission due to high intensity of service, high risk for further deterioration and high frequency of surveillance required.*   Signed, Terrilee Croak, MD [facility] 03/10/2019

## 2019-03-10 NOTE — ED Notes (Signed)
XR at bedside

## 2019-03-10 NOTE — ED Notes (Signed)
MD made aware of trending BP.

## 2019-03-10 NOTE — ED Notes (Signed)
Patient given orange juice for CBG 48. Will recheck in 15 minutes.

## 2019-03-10 NOTE — ED Triage Notes (Signed)
Please refer to original [facility] chart for ED Nursing notes

## 2019-03-10 NOTE — ED Notes (Signed)
Hospitalist at bedside 

## 2019-03-10 NOTE — ED Notes (Signed)
Pt's oxygen dropped to 84 on room air. Pt was placed on oxygen immediately. Rn Notified.

## 2019-03-10 NOTE — ED Notes (Signed)
Dr. Pietro Cassis made aware patient O2 saturation 84% on RA and 59% after application of 2L [place].

## 2019-03-10 NOTE — ED Notes (Addendum)
Patient has tolerated a cup of water w/o any difficulties or complaints. Patient has been given [place] sandwich and diet sprite.

## 2019-03-10 NOTE — Progress Notes (Signed)
[place] NOVEL CORONAVIRUS (COVID-19) DAILY CHECK-OFF SYMPTOMS - answer yes or no to each - every day NO YES  Have you had a fever in the past 24 hours?  . Fever (Temp > 37.80C / 100F) X   Have you had any of these symptoms in the past 24 hours? . New Cough .  Sore Throat  .  Shortness of Breath .  Difficulty Breathing .  Unexplained Body Aches   X   Have you had any one of these symptoms in the past 24 hours not related to allergies?   . Runny Nose .  Nasal Congestion .  Sneezing   X   If you have had runny nose, nasal congestion, sneezing in the past 24 hours, has it worsened?  X   EXPOSURES - check yes or no X   Have you traveled outside the state in the past 14 days?  X   Have you been in contact with someone with a confirmed diagnosis of COVID-19 or PUI in the past 14 days without wearing appropriate PPE?  X   Have you been living in the same home as a person with confirmed diagnosis of COVID-19 or a PUI (household contact)?    X   Have you been diagnosed with COVID-19?    X              What to do next: Answered NO to all: Answered YES to anything:   Proceed with unit schedule Follow the [facility] Inpatient Flowsheet.   

## 2019-03-10 NOTE — ED Triage Notes (Signed)
Patient arrived by EMS from [facility]. Pt arrived w/ fever from [facility]. BH had T 103.2 F. Pt c/o of occasional cough over the last few days.   EMS VS 100.6 F, CBG 450, SpO2 97% on RA, BP 152/80, Pulse 96.   Hx of DM

## 2019-03-10 NOTE — ED Notes (Signed)
Pt's sugar dropped to 45. RN was notified and Orange juice was given. Will check CBG again in 64min.

## 2019-03-10 NOTE — Progress Notes (Signed)
[place] NOVEL CORONAVIRUS (COVID-19) DAILY CHECK-OFF SYMPTOMS - answer yes or no to each - every day NO YES  Have you had a fever in the past 24 hours?  . Fever (Temp > 37.80C / 100F)  x  Have you had any of these symptoms in the past 24 hours? . New Cough .  Sore Throat  .  Shortness of Breath .  Difficulty Breathing .  Unexplained Body Aches    x  Have you had any one of these symptoms in the past 24 hours not related to allergies?   . Runny Nose .  Nasal Congestion .  Sneezing   X   If you have had runny nose, nasal congestion, sneezing in the past 24 hours, has it worsened?  X   EXPOSURES - check yes or no X   Have you traveled outside the state in the past 14 days?  X   Have you been in contact with someone with a confirmed diagnosis of COVID-19 or PUI in the past 14 days without wearing appropriate PPE?  X   Have you been living in the same home as a person with confirmed diagnosis of COVID-19 or a PUI (household contact)?    X   Have you been diagnosed with COVID-19?    X              What to do next: Answered NO to all: Answered YES to anything:   Proceed with unit schedule Follow the [facility] Inpatient Flowsheet.

## 2019-03-10 NOTE — ED Notes (Signed)
ED TO INPATIENT HANDOFF REPORT  ED Nurse Name and Phone #:   S Name/Age/Gender Deanna Landry 48 y.o. female Room/Bed: WOTF/NONE  Code Status   Code Status: Full Code  Home/SNF/Other Home Patient oriented to: self, place, time and situation Is this baseline? Yes   Triage Complete: Triage complete  Chief Complaint MDD  Triage Note Patient arrived by EMS from [facility]. Pt arrived w/ fever from [facility]. BH had T 103.2 F. Pt c/o of occasional cough over the last few days.   EMS VS 100.6 F, CBG 450, SpO2 97% on RA, BP 152/80, Pulse 96.   Hx of DM   Allergies No Known Allergies  Level of Care/Admitting Diagnosis ED Disposition    ED Disposition Condition [facility] Area: [place] [[id number]]  Level of Care: Stepdown [14]  Admit to SDU based on following criteria: Hemodynamic compromise or significant risk of instability:  Patient requiring short term acute titration and management of vasoactive drips, and invasive monitoring (i.e., CVP and Arterial line).  Covid Evaluation: Person Under Investigation (PUI)  Isolation Risk Level: Low Risk  (Less than 4L [place] supplementation)  Diagnosis: Fever [344092]  Admitting Physician: Terrilee Croak [[id number]]  Attending Physician: Terrilee Croak [[id number]]  Estimated length of stay: past midnight tomorrow  Certification:: I certify this patient will need inpatient services for at least 2 midnights  PT Class (Do Not Modify): Inpatient [101]  PT Acc Code (Do Not Modify): Private [1]       B Medical/Surgery History Past Medical History:  Diagnosis Date  . CHF (congestive heart failure) ([place])   . Diabetes mellitus without complication ([place])   . Hypertension   . Myocardial infarct, old   . Renal disorder   . Stroke [facility])    Past Surgical History:  Procedure Laterality Date  . ABDOMINAL HYSTERECTOMY    . EYE SURGERY    . TOE AMPUTATION       A IV Location/Drains/Wounds Patient Lines/Drains/Airways Status    Active Line/Drains/Airways    Name:   Placement date:   Placement time:   Site:   Days:   Peripheral IV 03/10/19 Right Hand   03/10/19    1146    Hand   less than 1   Peripheral IV 03/10/19 Right Forearm   03/10/19    1200    Forearm   less than 1          Intake/Output Last 24 hours No intake or output data in the 24 hours ending 03/10/19 1854  Labs/Imaging Results for orders placed or performed during the hospital encounter of 03/09/19 (from the past 48 hour(s))  Glucose, capillary     Status: Abnormal   Collection Time: 03/09/19  5:18 PM  Result Value Ref Range   Glucose-Capillary 277 (H) 70 - 99 mg/dL  Glucose, capillary     Status: Abnormal   Collection Time: 03/09/19  8:56 PM  Result Value Ref Range   Glucose-Capillary 25 (LL) 70 - 99 mg/dL   Comment 1 Notify RN    Comment 2 Document in Chart   Glucose, capillary     Status: Abnormal   Collection Time: 03/09/19  9:15 PM  Result Value Ref Range   Glucose-Capillary 35 (LL) 70 - 99 mg/dL   Comment 1 Notify RN    Comment 2 Document in Chart   Glucose, capillary     Status: Abnormal   Collection Time: 03/09/19  9:32 PM  Result Value  Ref Range   Glucose-Capillary 44 (LL) 70 - 99 mg/dL   Comment 1 Notify RN    Comment 2 Document in Chart   Glucose, capillary     Status: Abnormal   Collection Time: 03/09/19  9:46 PM  Result Value Ref Range   Glucose-Capillary 44 (LL) 70 - 99 mg/dL   Comment 1 Notify RN    Comment 2 Document in Chart   Glucose, capillary     Status: Abnormal   Collection Time: 03/09/19 10:18 PM  Result Value Ref Range   Glucose-Capillary 111 (H) 70 - 99 mg/dL   Comment 1 Notify RN    Comment 2 Document in Chart   Glucose, capillary     Status: Abnormal   Collection Time: 03/10/19  1:17 AM  Result Value Ref Range   Glucose-Capillary 140 (H) 70 - 99 mg/dL   Comment 1 Notify RN    Comment 2 Document in Chart   Glucose, capillary     Status: Abnormal   Collection Time: 03/10/19  6:07 AM  Result  Value Ref Range   Glucose-Capillary 388 (H) 70 - 99 mg/dL   Comment 1 Notify RN    Comment 2 Document in Chart   Urinalysis, Routine w reflex microscopic     Status: Abnormal   Collection Time: 03/10/19  7:45 AM  Result Value Ref Range   Color, Urine YELLOW YELLOW   APPearance CLEAR CLEAR   Specific Gravity, Urine 1.010 1.005 - 1.030   pH 5.0 5.0 - 8.0   Glucose, UA >=500 (A) NEGATIVE mg/dL   Hgb urine dipstick NEGATIVE NEGATIVE   Bilirubin Urine NEGATIVE NEGATIVE   Ketones, ur NEGATIVE NEGATIVE mg/dL   Protein, ur NEGATIVE NEGATIVE mg/dL   Nitrite NEGATIVE NEGATIVE   Leukocytes,Ua TRACE (A) NEGATIVE   RBC / HPF 0-5 0 - 5 RBC/hpf   WBC, UA 6-10 0 - 5 WBC/hpf   Bacteria, UA RARE (A) NONE SEEN   Squamous Epithelial / LPF 0-5 0 - 5   Mucus PRESENT    Hyaline Casts, UA PRESENT     Comment: Performed at [facility], [place] [address], [place], [place] [address]  CBG monitoring, ED     Status: Abnormal   Collection Time: 03/10/19  8:02 AM  Result Value Ref Range   Glucose-Capillary 361 (H) 70 - 99 mg/dL  CBC with Differential     Status: Abnormal   Collection Time: 03/10/19 11:48 AM  Result Value Ref Range   WBC 6.9 4.0 - 10.5 K/uL   RBC 3.43 (L) 3.87 - 5.11 MIL/uL   Hemoglobin 9.8 (L) 12.0 - 15.0 g/dL   HCT 30.9 (L) 36.0 - 46.0 %   MCV 90.1 80.0 - 100.0 fL   MCH 28.6 26.0 - 34.0 pg   MCHC 31.7 30.0 - 36.0 g/dL   RDW 13.8 11.5 - 15.5 %   Platelets 129 (L) 150 - 400 K/uL   nRBC 0.0 0.0 - 0.2 %   Neutrophils Relative % 78 %   Neutro Abs 5.3 1.7 - 7.7 K/uL   Lymphocytes Relative 14 %   Lymphs Abs 1.0 0.7 - 4.0 K/uL   Monocytes Relative 8 %   Monocytes Absolute 0.5 0.1 - 1.0 K/uL   Eosinophils Relative 0 %   Eosinophils Absolute 0.0 0.0 - 0.5 K/uL   Basophils Relative 0 %   Basophils Absolute 0.0 0.0 - 0.1 K/uL   Immature Granulocytes 0 %   Abs Immature Granulocytes 0.02  0.00 - 0.07 K/uL    Comment: Performed at [facility], [place]  [address], [place], [place] [address]  Comprehensive metabolic panel     Status: Abnormal   Collection Time: 03/10/19 11:48 AM  Result Value Ref Range   Sodium 138 135 - 145 mmol/L   Potassium 5.1 3.5 - 5.1 mmol/L   Chloride 107 98 - 111 mmol/L   CO2 23 22 - 32 mmol/L   Glucose, Bld 133 (H) 70 - 99 mg/dL   BUN 64 (H) 6 - 20 mg/dL   Creatinine, Ser 2.95 (H) 0.44 - 1.00 mg/dL   Calcium 8.8 (L) 8.9 - 10.3 mg/dL   Total Protein 6.9 6.5 - 8.1 g/dL   Albumin 3.6 3.5 - 5.0 g/dL   AST 9 (L) 15 - 41 U/L   ALT 10 0 - 44 U/L   Alkaline Phosphatase 106 38 - 126 U/L   Total Bilirubin 0.4 0.3 - 1.2 mg/dL   GFR calc non Af Amer 18 (L) >60 mL/min   GFR calc Af Amer 21 (L) >60 mL/min   Anion gap 8 5 - 15    Comment: Performed at [facility], [place] [address], [place], [place] [address]  Lipase, blood     Status: None   Collection Time: 03/10/19 11:48 AM  Result Value Ref Range   Lipase 27 11 - 51 U/L    Comment: Performed at [facility], [place] [address] [place], [place] [address]  Lactic acid, plasma     Status: None   Collection Time: 03/10/19 11:48 AM  Result Value Ref Range   Lactic Acid, Venous 1.1 0.5 - 1.9 mmol/L    Comment: Performed at [facility], [place] [address] [place], [place] [address]  CBG monitoring, ED     Status: Abnormal   Collection Time: 03/10/19 12:49 PM  Result Value Ref Range   Glucose-Capillary 118 (H) 70 - 99 mg/dL  SARS Coronavirus 2 [facility] order, Performed in [place] hospital lab)     Status: None   Collection Time: 03/10/19  3:10 PM  Result Value Ref Range   SARS Coronavirus 2 NEGATIVE NEGATIVE    Comment: (NOTE) If result is NEGATIVE SARS-CoV-2 target nucleic acids are NOT DETECTED. The SARS-CoV-2 RNA is generally detectable in upper and lower  respiratory specimens during the acute phase of infection. The lowest  concentration of SARS-CoV-2 viral copies this assay can detect is 250  copies / mL.  A negative result does not preclude SARS-CoV-2 infection  and should not be used as the sole basis for treatment or other  patient management decisions.  A negative result may occur with  improper specimen collection / handling, submission of specimen other  than nasopharyngeal swab, presence of viral mutation(s) within the  areas targeted by this assay, and inadequate number of viral copies  (<250 copies / mL). A negative result must be combined with clinical  observations, patient history, and epidemiological information. If result is POSITIVE SARS-CoV-2 target nucleic acids are DETECTED. The SARS-CoV-2 RNA is generally detectable in upper and lower  respiratory specimens dur ing the acute phase of infection.  Positive  results are indicative of active infection with SARS-CoV-2.  Clinical  correlation with patient history and other diagnostic information is  necessary to determine patient infection status.  Positive results do  not rule out bacterial infection or co-infection with other viruses. If result is PRESUMPTIVE POSTIVE SARS-CoV-2 nucleic acids MAY BE  PRESENT.   A presumptive positive result was obtained on the submitted specimen  and confirmed on repeat testing.  While 2019 novel coronavirus  (SARS-CoV-2) nucleic acids may be present in the submitted sample  additional confirmatory testing may be necessary for epidemiological  and / or clinical management purposes  to differentiate between  SARS-CoV-2 and other Sarbecovirus currently known to infect humans.  If clinically indicated additional testing with an alternate test  methodology [phone]) is advised. The SARS-CoV-2 RNA is generally  detectable in upper and lower respiratory sp ecimens during the acute  phase of infection. The expected result is Negative. Fact Sheet for Patients:  StrictlyIdeas.no Fact Sheet for Healthcare Providers: BankingDealers.co.za This test is not  yet approved or cleared by the [place] FDA and has been authorized for detection and/or diagnosis of SARS-CoV-2 by FDA under an Emergency Use Authorization (EUA).  This EUA will remain in effect (meaning this test can be used) for the duration of the COVID-19 declaration under Section 564(b)(1) of the Act, 21 U.S.C. section 360bbb-3(b)(1), unless the authorization is terminated or revoked sooner. Performed at [facility], [place] [address], [place], [place] [address]   CBG monitoring, ED     Status: Abnormal   Collection Time: 03/10/19  5:01 PM  Result Value Ref Range   Glucose-Capillary 48 (L) 70 - 99 mg/dL  CBG monitoring, ED     Status: None   Collection Time: 03/10/19  5:56 PM  Result Value Ref Range   Glucose-Capillary 99 70 - 99 mg/dL   Dg Chest Port 1 View  Result Date: 03/10/2019 CLINICAL DATA:  Fever EXAM: PORTABLE CHEST 1 VIEW COMPARISON:  None. FINDINGS: Lungs are clear. Heart size and pulmonary vascularity are within normal limits. No adenopathy. No bone lesions. IMPRESSION: No edema or consolidation. Electronically Signed   By: Lowella Grip III M.D.   On: 03/10/2019 11:34    Pending Labs Unresulted Labs (From admission, onward)    Start     Ordered   03/17/19 0500  Creatinine, serum  (enoxaparin (LOVENOX)    CrCl < 30 ml/min)  Weekly,   R    Comments:  while on enoxaparin therapy.    03/10/19 1613   03/11/19 0500  HIV antibody (Routine Testing)  Tomorrow morning,   R     03/10/19 1613   03/11/19 6962  Basic metabolic panel  Daily,   R     03/10/19 1613   03/11/19 0500  CBC  Daily,   R     03/10/19 1613   03/10/19 1609  Hemoglobin A1c  Add-on,   AD    Comments:  To assess prior glycemic control    03/10/19 1613   03/10/19 1609  CBC  (enoxaparin (LOVENOX)    CrCl < 30 ml/min)  Once,   R    Comments:  Baseline for enoxaparin therapy IF NOT ALREADY DRAWN.  Notify MD if PLT < 100 K.    03/10/19 1613   03/10/19 1609  Creatinine, serum   (enoxaparin (LOVENOX)    CrCl < 30 ml/min)  Once,   R    Comments:  Baseline for enoxaparin therapy IF NOT ALREADY DRAWN.    03/10/19 1613   03/10/19 1103  Urine culture  ONCE - STAT,   STAT     03/10/19 1103   03/10/19 1102  Blood culture (routine x 2)  BLOOD CULTURE X 2,   STAT     03/10/19 1102  Vitals/Pain Today's Vitals   03/10/19 1630 03/10/19 1655 03/10/19 1730 03/10/19 1758  BP: 98/62 98/62 106/65 106/65  Pulse: 70 67 72 70  Resp: 16 16 19 15   Temp:      TempSrc:      SpO2: 96% 95% 99% 97%  Weight:      Height:      PainSc:        Isolation Precautions No active isolations  Medications Medications  acetaminophen (TYLENOL) tablet 650 mg (has no administration in time range)  alum & mag hydroxide-simeth (MAALOX/MYLANTA) 200-200-20 MG/5ML suspension 30 mL (has no administration in time range)  magnesium hydroxide (MILK OF MAGNESIA) suspension 30 mL (has no administration in time range)  hydrOXYzine (ATARAX/VISTARIL) tablet 25 mg (25 mg Oral Given 03/09/19 1754)  traZODone (DESYREL) tablet 50 mg (has no administration in time range)  amLODipine (NORVASC) tablet 10 mg (10 mg Oral Given 03/10/19 0818)  LORazepam (ATIVAN) tablet 1 mg (has no administration in time range)  atorvastatin (LIPITOR) tablet 80 mg (80 mg Oral Given 03/09/19 1749)  buPROPion (WELLBUTRIN XL) 24 hr tablet 300 mg (300 mg Oral Given 03/10/19 0818)  clopidogrel (PLAVIX) tablet 75 mg (75 mg Oral Given 03/10/19 0843)  dorzolamide-timolol (COSOPT) 22.3-6.8 MG/ML ophthalmic solution 1 drop (0 drops Both Eyes Hold 03/10/19 0838)  furosemide (LASIX) tablet 20 mg (20 mg Oral Given 03/10/19 0817)  insulin detemir (LEVEMIR) injection 30 Units (30 Units Subcutaneous Not Given 03/10/19 1819)  isosorbide dinitrate (ISORDIL) tablet 20 mg (20 mg Oral Given 03/10/19 0843)  latanoprost (XALATAN) 0.005 % ophthalmic solution 1 drop (1 drop Both Eyes Given 03/09/19 2224)  levothyroxine (SYNTHROID) tablet 50 mcg (50 mcg  Oral Given 03/10/19 0641)  pantoprazole (PROTONIX) EC tablet 40 mg (40 mg Oral Given 03/10/19 0817)  pregabalin (LYRICA) capsule 75 mg (75 mg Oral Given 03/10/19 0817)  sertraline (ZOLOFT) tablet 100 mg (100 mg Oral Given 03/10/19 0817)  ARIPiprazole (ABILIFY) tablet 5 mg (5 mg Oral Given 03/09/19 2224)  sodium chloride flush (NS) 0.9 % injection 3 mL (has no administration in time range)  acetaminophen (TYLENOL) tablet 650 mg (has no administration in time range)    Or  acetaminophen (TYLENOL) suppository 650 mg (has no administration in time range)  senna (SENOKOT) tablet 8.6 mg (has no administration in time range)  sorbitol 70 % solution 30 mL (has no administration in time range)  ondansetron (ZOFRAN) tablet 4 mg (has no administration in time range)    Or  ondansetron (ZOFRAN) injection 4 mg (has no administration in time range)  insulin aspart (novoLOG) injection 0-9 Units (0 Units Subcutaneous Not Given 03/10/19 1820)  insulin aspart (novoLOG) injection 0-5 Units (has no administration in time range)  enoxaparin (LOVENOX) injection 30 mg (has no administration in time range)  0.9 %  sodium chloride infusion (has no administration in time range)  dextrose (GLUTOSE) 40 % oral gel (37.5 g  Given 03/09/19 2225)  acetaminophen (TYLENOL) tablet 1,000 mg (1,000 mg Oral Given 03/10/19 0758)  ibuprofen (ADVIL) tablet 800 mg (800 mg Oral Given 03/10/19 0759)  sodium chloride 0.9 % bolus 1,000 mL (0 mLs Intravenous Stopped 03/10/19 1433)    And  sodium chloride 0.9 % bolus 1,000 mL (0 mLs Intravenous Stopped 03/10/19 1433)    And  sodium chloride 0.9 % bolus 1,000 mL (0 mLs Intravenous Stopped 03/10/19 1603)  cefTRIAXone (ROCEPHIN) 2 g in sodium chloride 0.9 % 100 mL IVPB (0 g Intravenous Stopped 03/10/19 1315)  sodium chloride  0.9 % bolus 1,000 mL (0 mLs Intravenous Stopped 03/10/19 1603)  azithromycin (ZITHROMAX) 500 mg in sodium chloride 0.9 % 250 mL IVPB (0 mg Intravenous Stopped 03/10/19 1744)     Mobility walks Moderate fall risk   Focused Assessments   R Recommendations: See Admitting Provider Note  Report given to:   Additional Notes:

## 2019-03-10 NOTE — ED Notes (Signed)
Bed: [facility] Expected date:  Expected time:  Means of arrival:  Comments: 

## 2019-03-10 NOTE — ED Notes (Signed)
Patient given additional orange juice and cracker with peanut butter to treat hypoglycemia. CBG 48. Will recheck in 15 minutes.

## 2019-03-10 NOTE — ED Notes (Signed)
Patient given meal tray.

## 2019-03-10 NOTE — Discharge Instructions (Signed)
Your urine is negative for infection.  With just a short time of symptoms something else certainly can be going on with you.  Viral illness is the most common cause of fever.  If you had worsening symptoms i.e. abdominal pain vomiting cough or shortness of breath please be reevaluated.  During this outbreak you should try to isolate yourself as best as you can from other people.

## 2019-03-10 NOTE — ED Notes (Signed)
Patient is being transported to [facility] w/ tech and [facility].

## 2019-03-11 ENCOUNTER — Encounter (HOSPITAL_COMMUNITY): Payer: Self-pay

## 2019-03-11 ENCOUNTER — Other Ambulatory Visit: Payer: Self-pay

## 2019-03-11 ENCOUNTER — Inpatient Hospital Stay (HOSPITAL_COMMUNITY): Payer: Medicare HMO

## 2019-03-11 DIAGNOSIS — F4323 Adjustment disorder with mixed anxiety and depressed mood: Secondary | ICD-10-CM

## 2019-03-11 HISTORY — DX: Adjustment disorder with mixed anxiety and depressed mood: F43.23

## 2019-03-11 LAB — INFLUENZA PANEL BY PCR (TYPE A & B)
Influenza A By PCR: NEGATIVE
Influenza B By PCR: NEGATIVE

## 2019-03-11 LAB — RESPIRATORY PANEL BY PCR

## 2019-03-11 LAB — COMPREHENSIVE METABOLIC PANEL
ALT: 11 U/L (ref 0–44)
ALT: 11 U/L (ref 0–44)
AST: 11 U/L — ABNORMAL LOW (ref 15–41)
AST: 10 U/L — ABNORMAL LOW (ref 15–41)
Albumin: 2.9 g/dL — ABNORMAL LOW (ref 3.5–5.0)
Albumin: 3 g/dL — ABNORMAL LOW (ref 3.5–5.0)
Alkaline Phosphatase: 95 U/L (ref 38–126)
Alkaline Phosphatase: 85 U/L (ref 38–126)
Anion gap: 3 — ABNORMAL LOW (ref 5–15)
Anion gap: 4 — ABNORMAL LOW (ref 5–15)
BUN: 52 mg/dL — ABNORMAL HIGH (ref 6–20)
BUN: 53 mg/dL — ABNORMAL HIGH (ref 6–20)
CO2: 22 mmol/L (ref 22–32)
CO2: 22 mmol/L (ref 22–32)
Calcium: 7.9 mg/dL — ABNORMAL LOW (ref 8.9–10.3)
Calcium: 8 mg/dL — ABNORMAL LOW (ref 8.9–10.3)
Chloride: 115 mmol/L — ABNORMAL HIGH (ref 98–111)
Chloride: 115 mmol/L — ABNORMAL HIGH (ref 98–111)
Creatinine, Ser: 2.29 mg/dL — ABNORMAL HIGH (ref 0.44–1.00)
Creatinine, Ser: 2.24 mg/dL — ABNORMAL HIGH (ref 0.44–1.00)
GFR calc Af Amer: 29 mL/min — ABNORMAL LOW (ref >60)
GFR calc Af Amer: 30 mL/min — ABNORMAL LOW (ref >60)
GFR calc non Af Amer: 25 mL/min — ABNORMAL LOW (ref >60)
GFR calc non Af Amer: 25 mL/min — ABNORMAL LOW (ref >60)
Glucose, Bld: 234 mg/dL — ABNORMAL HIGH (ref 70–99)
Glucose, Bld: 201 mg/dL — ABNORMAL HIGH (ref 70–99)
Potassium: 5.7 mmol/L — ABNORMAL HIGH (ref 3.5–5.1)
Potassium: 5.6 mmol/L — ABNORMAL HIGH (ref 3.5–5.1)
Sodium: 140 mmol/L (ref 135–145)
Sodium: 141 mmol/L (ref 135–145)
Total Bilirubin: 0.2 mg/dL — ABNORMAL LOW (ref 0.3–1.2)
Total Bilirubin: 0.5 mg/dL (ref 0.3–1.2)
Total Protein: 6.1 g/dL — ABNORMAL LOW (ref 6.5–8.1)
Total Protein: 5.9 g/dL — ABNORMAL LOW (ref 6.5–8.1)

## 2019-03-11 LAB — C-REACTIVE PROTEIN
CRP: 5.3 mg/dL — ABNORMAL HIGH (ref <1.0)
CRP: 5.3 mg/dL — ABNORMAL HIGH (ref <1.0)

## 2019-03-11 LAB — LACTIC ACID, PLASMA
Lactic Acid, Venous: 0.7 mmol/L (ref 0.5–1.9)
Lactic Acid, Venous: 0.9 mmol/L (ref 0.5–1.9)

## 2019-03-11 LAB — D-DIMER, QUANTITATIVE: D-Dimer, Quant: 0.65 ug/mL-FEU — ABNORMAL HIGH (ref 0.00–0.50)

## 2019-03-11 LAB — CBC WITH DIFFERENTIAL/PLATELET
Abs Immature Granulocytes: 0.01 10*3/uL (ref 0.00–0.07)
Basophils Absolute: 0 10*3/uL (ref 0.0–0.1)
Basophils Relative: 0 %
Eosinophils Absolute: 0 10*3/uL (ref 0.0–0.5)
Eosinophils Relative: 1 %
HCT: 27.3 % — ABNORMAL LOW (ref 36.0–46.0)
Hemoglobin: 8.3 g/dL — ABNORMAL LOW (ref 12.0–15.0)
Immature Granulocytes: 0 %
Lymphocytes Relative: 23 %
Lymphs Abs: 1 10*3/uL (ref 0.7–4.0)
MCH: 28.3 pg (ref 26.0–34.0)
MCHC: 30.4 g/dL (ref 30.0–36.0)
MCV: 93.2 fL (ref 80.0–100.0)
Monocytes Absolute: 0.3 10*3/uL (ref 0.1–1.0)
Monocytes Relative: 6 %
Neutro Abs: 3.1 10*3/uL (ref 1.7–7.7)
Neutrophils Relative %: 70 %
Platelets: 98 10*3/uL — ABNORMAL LOW (ref 150–400)
RBC: 2.93 MIL/uL — ABNORMAL LOW (ref 3.87–5.11)
RDW: 13.9 % (ref 11.5–15.5)
WBC: 4.4 10*3/uL (ref 4.0–10.5)
nRBC: 0 % (ref 0.0–0.2)

## 2019-03-11 LAB — GLUCOSE, CAPILLARY
Glucose-Capillary: 196 mg/dL — ABNORMAL HIGH (ref 70–99)
Glucose-Capillary: 160 mg/dL — ABNORMAL HIGH (ref 70–99)
Glucose-Capillary: 141 mg/dL — ABNORMAL HIGH (ref 70–99)
Glucose-Capillary: 123 mg/dL — ABNORMAL HIGH (ref 70–99)
Glucose-Capillary: 188 mg/dL — ABNORMAL HIGH (ref 70–99)
Glucose-Capillary: 331 mg/dL — ABNORMAL HIGH (ref 70–99)
Glucose-Capillary: 256 mg/dL — ABNORMAL HIGH (ref 70–99)

## 2019-03-11 LAB — PHOSPHORUS: Phosphorus: 3.2 mg/dL (ref 2.5–4.6)

## 2019-03-11 LAB — TSH: TSH: 0.816 u[IU]/mL (ref 0.350–4.500)

## 2019-03-11 LAB — PROCALCITONIN: Procalcitonin: 0.25 ng/mL

## 2019-03-11 LAB — PROTIME-INR
INR: 1 (ref 0.8–1.2)
Prothrombin Time: 12.9 seconds (ref 11.4–15.2)

## 2019-03-11 LAB — TROPONIN I: Troponin I: <0.03 ng/mL (ref <0.03)

## 2019-03-11 LAB — STREP PNEUMONIAE URINARY ANTIGEN: Strep Pneumo Urinary Antigen: NEGATIVE

## 2019-03-11 LAB — URINE CULTURE

## 2019-03-11 LAB — LACTATE DEHYDROGENASE: LDH: 142 U/L (ref 98–192)

## 2019-03-11 LAB — MAGNESIUM: Magnesium: 2.3 mg/dL (ref 1.7–2.4)

## 2019-03-11 LAB — SARS CORONAVIRUS 2 BY RT PCR (HOSPITAL ORDER, PERFORMED IN ~~LOC~~ HOSPITAL LAB): SARS Coronavirus 2: NEGATIVE

## 2019-03-11 LAB — APTT: aPTT: 32 seconds (ref 24–36)

## 2019-03-11 LAB — FERRITIN: Ferritin: 55 ng/mL (ref 11–307)

## 2019-03-11 MED ORDER — BUPROPION HCL ER (SR) 150 MG PO TB12: 150.0000 mg | ORAL_TABLET | Freq: Every day | ORAL | Status: DC

## 2019-03-11 MED ORDER — INSULIN ASPART 100 UNIT/ML ~~LOC~~ SOLN
4.0000 [IU] | Freq: Three times a day (TID) | SUBCUTANEOUS | Status: DC
  Administered 2019-03-12 – 2019-03-15 (×6): 4 [IU] via SUBCUTANEOUS

## 2019-03-11 MED ORDER — SODIUM CHLORIDE 0.9 % IV SOLN
INTRAVENOUS | Status: DC
  Administered 2019-03-11: 10:00:00 via INTRAVENOUS

## 2019-03-11 MED ORDER — BUPROPION HCL ER (XL) 150 MG PO TB24
150.0000 mg | ORAL_TABLET | Freq: Every day | ORAL | Status: DC
  Administered 2019-03-11 – 2019-03-16 (×6): 150 mg via ORAL
  Filled 2019-03-11 (×6): qty 1

## 2019-03-11 MED ORDER — INSULIN ASPART 100 UNIT/ML ~~LOC~~ SOLN: 0.0000 [IU] | Freq: Three times a day (TID) | SUBCUTANEOUS | Status: DC

## 2019-03-11 MED ORDER — INSULIN DETEMIR 100 UNIT/ML ~~LOC~~ SOLN
15.0000 [IU] | Freq: Every day | SUBCUTANEOUS | Status: DC
  Administered 2019-03-11 – 2019-03-13 (×3): 15 [IU] via SUBCUTANEOUS
  Filled 2019-03-11 (×3): qty 0.15

## 2019-03-11 MED ORDER — POLYETHYLENE GLYCOL 3350 17 G PO PACK
17.0000 g | PACK | Freq: Every day | ORAL | Status: DC
  Administered 2019-03-11 – 2019-03-12 (×2): 17 g via ORAL
  Filled 2019-03-11 (×4): qty 1

## 2019-03-11 MED ORDER — INSULIN ASPART 100 UNIT/ML ~~LOC~~ SOLN
0.0000 [IU] | Freq: Every day | SUBCUTANEOUS | Status: DC
  Administered 2019-03-11: 22:00:00 3 [IU] via SUBCUTANEOUS
  Administered 2019-03-12: 2 [IU] via SUBCUTANEOUS
  Administered 2019-03-13: 3 [IU] via SUBCUTANEOUS
  Administered 2019-03-14: 2 [IU] via SUBCUTANEOUS

## 2019-03-11 MED ORDER — INSULIN ASPART 100 UNIT/ML ~~LOC~~ SOLN: 4.0000 [IU] | Freq: Three times a day (TID) | SUBCUTANEOUS | Status: DC

## 2019-03-11 MED ORDER — INSULIN ASPART 100 UNIT/ML ~~LOC~~ SOLN
0.0000 [IU] | Freq: Three times a day (TID) | SUBCUTANEOUS | Status: DC
  Administered 2019-03-11 – 2019-03-12 (×2): 11 [IU] via SUBCUTANEOUS
  Administered 2019-03-12: 5 [IU] via SUBCUTANEOUS
  Administered 2019-03-12: 3 [IU] via SUBCUTANEOUS
  Administered 2019-03-13: 8 [IU] via SUBCUTANEOUS
  Administered 2019-03-13: 11 [IU] via SUBCUTANEOUS
  Administered 2019-03-13: 5 [IU] via SUBCUTANEOUS
  Administered 2019-03-14 (×2): 8 [IU] via SUBCUTANEOUS
  Administered 2019-03-14: 11 [IU] via SUBCUTANEOUS
  Administered 2019-03-15 (×3): 3 [IU] via SUBCUTANEOUS
  Administered 2019-03-16: 2 [IU] via SUBCUTANEOUS
  Administered 2019-03-16: 11 [IU] via SUBCUTANEOUS

## 2019-03-11 MED ORDER — SODIUM CHLORIDE 0.9 % IV SOLN: INTRAVENOUS | Status: DC

## 2019-03-11 NOTE — Progress Notes (Signed)
[facility] PROGRESS NOTE  Deanna Landry DXA:[id number] DOB: 1971/05/01 DOA: 03/10/2019 PCP: Cher Nakai, MD   Narrative: 65 ? 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 Unilateral blindness right eye secondary to diabetic retinopathy Diabetic nephropathy-she does not know her baseline kidney function  Patient moved here from [place] last year where she was living with her daughter and grandchild to live with her mother who has multiple cardiac issues She lives in [place]  She has history of suicidality in the past but she also does have a history of schizophrenia and "hearing voices  Nursing tells me that she was hearing "dogs and cats this morning" when there were none She seems appropriate however euthymic and congruent-she realizes that some of these hallucinations may be in fact hallucinations    A & Plan ?  Suicidality ?  Overdose insulin Discussed with Dr. Darleene Cleaver psychiatry-patient will be seen tomorrow Sepsis query cause CXR two-view is pending-over read of chest x-ray 1 view leads me to think that she might of aspirated Would cover with broad-spectrum ceftriaxone azithromycin and may be transitioned to Augmentin in a.m. Cycle lactic acid CBC plus differential a.m. Might need to downgrade to dysphagia 2 diet , ?SLP input Diabetes mellitus type 1 since age 4 Diabetic nephropathy CBGs ranging from 1 23-1 60 Only eating about 40% of her meals At home takes insulin 10 3 times daily Trulicity 1.5 weekly Levemir 30 every morning We will resume Levemir at 15 every morning Continue Lyrica 75 twice daily AKI Hyperkalemia Probably secondary to multiple issues with underlying nephropathy Discontinue ACE forever Renally dose some of her meds based on GFR Hydrate with saline 125 cc/h for 18 hours and reassess Prior CVA? Risk factors include diabetes hypertension obesity-continue Plavix 75 daily Hypothyroidism Continue  Synthroid 50 mcg daily get TSH given psychiatric manifestations BMI 34    DVT Lovenox code Status: Presumed full code communication: Called phone number in chart for the patient's mother [phone] no answer cannot leave voicemail disposition Plan: Inpatient   Verlon Au, MD  [facility] [place] [email or web address] 7PM-7AM contact night coverage as above 03/11/2019, 9:32 AM  LOS: 1 day     Interval history/Subjective: Awake alert coherent Nurse tech tells me that she was hearing dogs and cats barking earlier today Patient seems to realize that this was not an actuality Does not seem to be distressed can tell me where she is can tell me the time the ear Does not seem to be in pain Passing good urine No cough No fever No chills No diarrhea  Objective:  Vitals:  Vitals:   03/11/19 0600 03/11/19 0800  BP: 115/62 (!) 114/51  Pulse: 61 68  Resp: 14 19  Temp:  98.4 F (36.9 C)  SpO2: 96% 95%    Exam:  EOMI NCAT visual acuity on right side significantly diminished however she is able to see on left side how things are going Neck soft supple S1-S2 no murmur rub or gallop Abdomen obese nontender nondistended no rebound No lower extremity edema Neurologically intact Flat affect   I have personally reviewed the following:  DATA   Labs:  BUN/creatinine down from 64/2 0.9-50 3/2.2  Potassium up from 5.1-5.6  LFTs normal  CRP 5.3  Lactic acid down from 1.1-0.7  Procalcitonin 0.25  Imaging studies:  Chest x-ray is pending   Medical tests:  n   Test discussed with performing physician:  n  Decision to obtain old records:  n  Review and summation of old records:  n  Scheduled Meds: . aspirin EC  81 mg Oral Daily  . atorvastatin  80 mg Oral Daily  . atropine  1 drop Right Eye BID  . clopidogrel  75 mg Oral Daily  . dorzolamide-timolol  1 drop Left Eye BID  . enoxaparin (LOVENOX) injection  30 mg Subcutaneous QHS  . insulin detemir   15 Units Subcutaneous Daily  . latanoprost  1 drop Both Eyes QPM  . levothyroxine  50 mcg Oral Q0600  . pantoprazole  40 mg Oral Daily  . polyethylene glycol  17 g Oral Daily  . prednisoLONE acetate  1 drop Left Eye BID  . pregabalin  75 mg Oral BID  . sertraline  100 mg Oral Daily   Continuous Infusions: . azithromycin    . cefTRIAXone (ROCEPHIN)  IV      Active Problems:   Fever   Sepsis ([place])   LOS: 1 day

## 2019-03-11 NOTE — H&P (Signed)
I had an H&P done for this admission on 03/10/2019 at 3:22 PM.  Unfortunately, it ended up being in previous encounter.  I copied the H&P to create a new one for current admission.    [facility] History and Physical  Deanna Landry UXL:[id number] DOB: July 12, 1971 DOA: 03/09/2019 Referring physician: ED PCP: Cher Nakai, MD  Chief Complaint: Fever ------------------------------------------------------------------------------------------------------ Assessment/Plan: Active Problems:   MDD (major depressive disorder), severe ([place])   Major depression, recurrent ([place])  Fever  -Up to 103.2.  Reports mild upper respiratory symptoms.  Not on oxygen requirement.  Chest x-ray negative.  Lactic acid level normal.  Urinalysis negative. No clear source of fever at this time.  COVID-19 test pending. Continue contact and droplet isolation for now.  Hypotension - Blood pressure as low as 80s in the ED. - Patient has history of hypertension and prior to admission, patient was on amlodipine, Lasix, nitrate, hydralazine, lisinopril. Keep all blood pressure medicine on hold.  Resume when blood pressure stabilizes.  Acute kidney injury - Creatinine elevated at 2.95.  May have some degree of chronic kidney disease based on her risk factors, not sure of baseline creatinine.  Monitor renal function with IV hydration.  Diabetes mellitus with hypoglycemia -Longstanding history of diabetes mellitus.  Hypoglycemic to 25 last night.  Patient mentioned that she had suicidal ideation by injecting insulin.  I am not sure if she actually did it.  Her blood sugar level was 25 last night. Hyperglycemic today. Prior to admission, patient was on Trulicity 1.5 mg weekly, Levemir 30 units twice daily, NovoLog 10 units pre-meal.   We will resume Lantus at 15 units twice daily and start on sliding scale insulin with Accu-Cheks.  Titrate dose based on blood sugar response.  CAD status post MI/history of stroke -Continue  aspirin. ?  Not on statin.  Suicidal ideation -Initially admitted to behavioral health yesterday.  We will continue suicidal precautions for now.  Likely discharge back to behavioral health when medically appropriate. - Prior to admission, patient was on bupropion, Zoloft.  Continue the same.  Mobility: Encourage ambulation Diet: Cardiac/diabetic diet DVT prophylaxis:  Lovenox Code Status:  Full code Disposition Plan:  Likely back to behavioral health in the next 2 to 3 days  ----------------------------------------------------------------------------------------------------- History of Present Illness: Deanna Landry is a 48 y.o. female with past medical history of hypertension, diabetes mellitus, CHF, CAD status post MI, CKD, stroke was admitted to behavioral health Hospital yesterday 4/23 for suicidal ideation with plan to overdose on her insulin.   At behavioral health, last night around 9:15 PM, patient's blood glucose level was found to be low at 25.  Patient was given 3 amps of dextrose, apple juice with correction of blood sugar level. This morning, patient had a fever of 103.2 F.  She also reported occasional cough in last few days.  She was sent to the ED for further evaluation. Patient also reported chills and myalgia.  No cough, congestion.  She reported increased urinary frequency but states it could be because of her hyperglycemia.  In the ED, temperature noted to be 103, blood pressure eventually dropped down to 86/43.  Breathing comfortably on room air with oxygen saturation more than 95% most of the times. Labs showed WBC count normal at 6.9, hemoglobin low at 9.8, platelet 129, lactate normal at 1.1, sodium 138, potassium 5.1, BUN/creatinine 64/2.95. Chest x-ray did not show any edema or consolidation.  Review of Systems:  All systems were reviewed and were  negative unless otherwise mentioned in the HPI  Past medical history: Past Medical History:  Diagnosis Date  . CHF  (congestive heart failure) ([place])   . Diabetes mellitus without complication ([place])   . Hypertension   . Myocardial infarct, old   . Renal disorder   . Stroke [facility])     Past surgical history: Past Surgical History:  Procedure Laterality Date  . ABDOMINAL HYSTERECTOMY    . EYE SURGERY    . TOE AMPUTATION      Social History:  reports that she has never smoked. She has never used smokeless tobacco. She reports previous alcohol use. She reports previous drug use.  Allergies:  No Known Allergies  Family history:  No family history on file.   Home Meds: Prior to Admission medications   Medication Sig Start Date End Date Taking? Authorizing Provider  amLODipine (NORVASC) 10 MG tablet Take 10 mg by mouth daily. 01/15/19  Yes [provider]  aspirin EC 81 MG tablet Take 81 mg by mouth daily.   Yes [provider]  atorvastatin (LIPITOR) 80 MG tablet Take 80 mg by mouth daily. 12/28/18  Yes [provider]  atropine 1 % ophthalmic solution Place 1 drop into the right eye 2 (two) times daily. 01/15/19  Yes [provider]  buPROPion (WELLBUTRIN XL) 150 MG 24 hr tablet Take 150 mg by mouth daily. 03/02/19  Yes [provider]  clopidogrel (PLAVIX) 75 MG tablet Take 75 mg by mouth daily. 01/15/19  Yes [provider]  dorzolamide-timolol (COSOPT) 22.3-6.8 MG/ML ophthalmic solution Place 1 drop into the left eye 2 (two) times daily. 03/06/19  Yes [provider]  furosemide (LASIX) 20 MG tablet Take 20 mg by mouth daily. 02/02/19  Yes [provider]  hydrALAZINE (APRESOLINE) 50 MG tablet Take 50 mg by mouth 3 (three) times daily.   Yes [provider]  isosorbide dinitrate (ISORDIL) 20 MG tablet Take 20 mg by mouth 2 (two) times daily. 01/27/19  Yes [provider]  latanoprost (XALATAN) 0.005 % ophthalmic solution Place 1 drop into both eyes every evening. 01/23/19  Yes [provider]  LEVEMIR 100  UNIT/ML injection Inject 30 Units into the skin 2 (two) times daily.  12/07/18  Yes [provider]  levothyroxine (SYNTHROID) 50 MCG tablet Take 50 mcg by mouth daily. 02/02/19  Yes [provider]  lisinopril (ZESTRIL) 20 MG tablet Take 20 mg by mouth daily. 12/21/18  Yes [provider]  NOVOLOG FLEXPEN 100 UNIT/ML FlexPen Inject 10 Units into the skin 3 (three) times daily. 01/23/19  Yes [provider]  pantoprazole (PROTONIX) 40 MG tablet Take 40 mg by mouth daily. 12/29/18  Yes [provider]  prednisoLONE acetate (PRED FORTE) 1 % ophthalmic suspension Place 1 drop into the left eye 2 (two) times daily. 12/15/18  Yes [provider]  pregabalin (LYRICA) 75 MG capsule Take 75 mg by mouth 2 (two) times daily. 02/11/19  Yes [provider]  sertraline (ZOLOFT) 100 MG tablet Take 100 mg by mouth daily. 01/27/19  Yes [provider]  TRULICITY 1.5 PF/7.9KW SOPN Inject 1.5 mg into the skin once a week. 02/07/19  Yes [provider]    Physical Exam: Vitals:   03/10/19 1430 03/10/19 1441 03/10/19 1457 03/10/19 1500  BP: (!) 89/48 (!) 104/57 (!) 99/51 (!) 100/51  Pulse: 68 79 72 70  Resp: 16 17 19 20   Temp:      TempSrc:  SpO2: 95% 96% 93% 94%  Weight:      Height:       Wt Readings from Last 3 Encounters:  03/10/19 83.5 kg   Body mass index is 33.65 kg/m.  General exam: Appears calm and comfortable.  Lying on bed.  Not agitated. Skin: No rashes, lesions or ulcers. HEENT: Normal Lungs: Clear to auscultation bilaterally CVS: Regular rate and rhythm, no murmur GI/Abd soft, nontender, nondistended, bowel sound present CNS: Alert, awake, oriented x3 Psychiatry: Mood & affect appropriate.  Extremities: No pedal edema, no calf tenderness  Labs on Admission:   CBC: Recent Labs  Lab 03/10/19 1148  WBC 6.9  NEUTROABS 5.3  HGB 9.8*  HCT 30.9*  MCV 90.1  PLT 129*    Basic Metabolic Panel: Recent Labs   Lab 03/10/19 1148  NA 138  K 5.1  CL 107  CO2 23  GLUCOSE 133*  BUN 64*  CREATININE 2.95*  CALCIUM 8.8*    Liver Function Tests: Recent Labs  Lab 03/10/19 1148  AST 9*  ALT 10  ALKPHOS 106  BILITOT 0.4  PROT 6.9  ALBUMIN 3.6   Recent Labs  Lab 03/10/19 1148  LIPASE 27   No results for input(s): AMMONIA in the last 168 hours.  Cardiac Enzymes: No results for input(s): CKTOTAL, CKMB, CKMBINDEX, TROPONINI in the last 168 hours.  BNP (last 3 results) No results for input(s): BNP in the last 8760 hours.  ProBNP (last 3 results) No results for input(s): PROBNP in the last 8760 hours.  CBG: Recent Labs  Lab 03/09/19 2218 03/10/19 0117 03/10/19 0607 03/10/19 0802 03/10/19 1249  GLUCAP 111* 140* 388* 361* 118*    Lipase     Component Value Date/Time   LIPASE 27 03/10/2019 1148     Urinalysis    Component Value Date/Time   COLORURINE YELLOW 03/10/2019 0745   APPEARANCEUR CLEAR 03/10/2019 0745   LABSPEC 1.010 03/10/2019 0745   PHURINE 5.0 03/10/2019 0745   GLUCOSEU >=500 (A) 03/10/2019 0745   HGBUR NEGATIVE 03/10/2019 0745   BILIRUBINUR NEGATIVE 03/10/2019 0745   KETONESUR NEGATIVE 03/10/2019 0745   PROTEINUR NEGATIVE 03/10/2019 0745   NITRITE NEGATIVE 03/10/2019 0745   LEUKOCYTESUR TRACE (A) 03/10/2019 0745     Drugs of Abuse  No results found for: LABOPIA, COCAINSCRNUR, LABBENZ, AMPHETMU, THCU, LABBARB    Radiological Exams on Admission: Dg Chest Port 1 View  Result Date: 03/10/2019 CLINICAL DATA:  Fever EXAM: PORTABLE CHEST 1 VIEW COMPARISON:  None. FINDINGS: Lungs are clear. Heart size and pulmonary vascularity are within normal limits. No adenopathy. No bone lesions. IMPRESSION: No edema or consolidation. Electronically Signed   By: Lowella Grip III M.D.   On: 03/10/2019 11:34   -----------------------------------------------------------------------------------------------------------------------------------------------------------  Severity of Illness: The appropriate patient status for this patient is INPATIENT. Inpatient status is judged to be reasonable and necessary in order to provide the required intensity of service to ensure the patient's safety. The patient's presenting symptoms, physical exam findings, and initial radiographic and laboratory data in the context of their chronic comorbidities is felt to place them at high risk for further clinical deterioration. Furthermore, it is not anticipated that the patient will be medically stable for discharge from the hospital within 2 midnights of admission. The following factors support the patient status of inpatient.   " The patient's presenting symptoms include hypoglycemia, fever. " The worrisome physical exam findings include fever. " The initial radiographic and laboratory data are worrisome because of AKI, hyperglycemia. "  The chronic co-morbidities include diabetes, CKD, suicidal ideation.   * I certify that at the point of admission it is my clinical judgment that the patient will require inpatient hospital care spanning beyond 2 midnights from the point of admission due to high intensity of service, high risk for further deterioration and high frequency of surveillance required.*   Signed, Terrilee Croak, MD [facility] 03/10/2019

## 2019-03-11 NOTE — Progress Notes (Signed)
Patients belongings were picked up today by security and delivered to the patient at his location.

## 2019-03-11 NOTE — Progress Notes (Addendum)
Inpatient Diabetes Program Recommendations  AACE/ADA: New Consensus Statement on Inpatient Glycemic Control (2015)  Target Ranges:  Prepandial:   less than 140 mg/dL      Peak postprandial:   less than 180 mg/dL (1-2 hours)      Critically ill patients:  140 - 180 mg/dL   Lab Results  Component Value Date   GLUCAP 123 (H) 03/11/2019   HGBA1C 6.8 (H) 03/10/2019    Review of Glycemic Control Results for [name], NULL (MRN [id number]) as of 03/11/2019 08:38  Ref. Range 03/10/2019 17:56 03/10/2019 21:32 03/10/2019 23:00 03/11/2019 01:59 03/11/2019 04:02 03/11/2019 06:02 03/11/2019 08:04  Glucose-Capillary Latest Ref Range: 70 - 99 mg/dL 99 145 (H) 172 (H) 196 (H) 160 (H) 141 (H) 123 (H)   Diabetes history: DM1 Outpatient Diabetes medications: Levemir 30 units + Novolog 10 units tid + Trulicity 1.5 q week Current orders for Inpatient glycemic control: None  Inpatient Diabetes Program Recommendations:   Spoke RN regarding if patient states type 1 or type 2 DM. Patient states she is type 1. Please consider: -Levemir 15 units (start @ 50% home dose instead of typical 80 % for type 1)  -Meal coverage when eating well:Novolog 4 units tid if eats 50% meals -Novolog sensitive correction tid   Thank you, [name]. Marbin Olshefski, RN, MSN, CDE  Diabetes Coordinator Inpatient Glycemic Control Team Team Pager [phone] (8am-5pm) 03/11/2019 8:46 AM

## 2019-03-11 NOTE — Consult Note (Signed)
[place] Psychiatry Consult   Reason for Consult: ''suicidal'' Referring Physician:  Dr. Verlon Au Patient Identification: Deanna Landry MRN:  [id number] Principal Diagnosis: Adjustment disorder with mixed anxiety and depressed mood Diagnosis:  Principal Problem:   Adjustment disorder with mixed anxiety and depressed mood Active Problems:   MDD (major depressive disorder), severe ([place])   Fever   Sepsis ([place])   Total Time spent with patient: 45 minutes  Subjective:   Deanna Landry is a 48 y.o. female patient admitted to ICU due to low blood pressure and hypoglycemia.  HPI:  Patient with history of hypertension, diabetes mellitus, CHF, CAD status post MI, CKD, stroke and depression who was admitted to behavioral health hospital on 4/23 for suicidal ideation with plan to overdose on her insulin but was transferred to medical ICU in [facility] due to fever, hypoglycemia and hypotension. Patient reports that she moved to [facility] a little over a year ago from [place]. But she has been having feeling stressed, overwhelmed due to multiple medical issues and since her son stopped taking to her few weeks ago after she refused to give him money. Today, patient is calm, cooperative, denies psychosis, delusions, suicidal ideation, intent or plan. However, patient is requesting to be referred to a psychiatrist and counselor upon discharge. She currently receives antidepressant from her PCP. Patient lives with her mother with whom she gets along well.  Past Psychiatric History: as above  Risk to Self: denies Risk to Others:  denies Prior Inpatient Therapy:  denies Prior Outpatient Therapy:  PCP  Past Medical History:  Past Medical History:  Diagnosis Date  . CHF (congestive heart failure) ([place])   . Diabetes mellitus without complication ([place])   . Hypertension   . Myocardial infarct, old   . Renal disorder   . Stroke [facility])     Past Surgical History:  Procedure Laterality  Date  . ABDOMINAL HYSTERECTOMY    . EYE SURGERY    . TOE AMPUTATION     Family History: History reviewed. No pertinent family history. Family Psychiatric  History:  Social History:  Social History   Substance and Sexual Activity  Alcohol Use Not Currently     Social History   Substance and Sexual Activity  Drug Use Not Currently    Social History   Socioeconomic History  . Marital status: Divorced    Spouse name: Not on file  . Number of children: Not on file  . Years of education: Not on file  . Highest education level: Not on file  Occupational History  . Not on file  Social Needs  . Financial resource strain: Not on file  . Food insecurity:    Worry: Not on file    Inability: Not on file  . Transportation needs:    Medical: Not on file    Non-medical: Not on file  Tobacco Use  . Smoking status: Never Smoker  . Smokeless tobacco: Never Used  Substance and Sexual Activity  . Alcohol use: Not Currently  . Drug use: Not Currently  . Sexual activity: Not on file  Lifestyle  . Physical activity:    Days per week: Not on file    Minutes per session: Not on file  . Stress: Not on file  Relationships  . Social connections:    Talks on phone: Not on file    Gets together: Not on file    Attends religious service: Not on file    Active member of  club or organization: Not on file    Attends meetings of clubs or organizations: Not on file    Relationship status: Not on file  Other Topics Concern  . Not on file  Social History Narrative  . Not on file   Additional Social History:    Allergies:  No Known Allergies  Labs:  Results for orders placed or performed during the hospital encounter of 03/10/19 (from the past 48 hour(s))  SARS Coronavirus 2 [facility] order, Performed in [place] hospital lab)     Status: None   Collection Time: 03/10/19  9:28 PM  Result Value Ref Range   SARS Coronavirus 2 NEGATIVE NEGATIVE    Comment: (NOTE) If result is  NEGATIVE SARS-CoV-2 target nucleic acids are NOT DETECTED. The SARS-CoV-2 RNA is generally detectable in upper and lower  respiratory specimens during the acute phase of infection. The lowest  concentration of SARS-CoV-2 viral copies this assay can detect is 250  copies / mL. A negative result does not preclude SARS-CoV-2 infection  and should not be used as the sole basis for treatment or other  patient management decisions.  A negative result may occur with  improper specimen collection / handling, submission of specimen other  than nasopharyngeal swab, presence of viral mutation(s) within the  areas targeted by this assay, and inadequate number of viral copies  (<250 copies / mL). A negative result must be combined with clinical  observations, patient history, and epidemiological information. If result is POSITIVE SARS-CoV-2 target nucleic acids are DETECTED. The SARS-CoV-2 RNA is generally detectable in upper and lower  respiratory specimens dur ing the acute phase of infection.  Positive  results are indicative of active infection with SARS-CoV-2.  Clinical  correlation with patient history and other diagnostic information is  necessary to determine patient infection status.  Positive results do  not rule out bacterial infection or co-infection with other viruses. If result is PRESUMPTIVE POSTIVE SARS-CoV-2 nucleic acids MAY BE PRESENT.   A presumptive positive result was obtained on the submitted specimen  and confirmed on repeat testing.  While 2019 novel coronavirus  (SARS-CoV-2) nucleic acids may be present in the submitted sample  additional confirmatory testing may be necessary for epidemiological  and / or clinical management purposes  to differentiate between  SARS-CoV-2 and other Sarbecovirus currently known to infect humans.  If clinically indicated additional testing with an alternate test  methodology [phone]) is advised. The SARS-CoV-2 RNA is generally  detectable  in upper and lower respiratory sp ecimens during the acute  phase of infection. The expected result is Negative. Fact Sheet for Patients:  StrictlyIdeas.no Fact Sheet for Healthcare Providers: BankingDealers.co.za This test is not yet approved or cleared by the [place] FDA and has been authorized for detection and/or diagnosis of SARS-CoV-2 by FDA under an Emergency Use Authorization (EUA).  This EUA will remain in effect (meaning this test can be used) for the duration of the COVID-19 declaration under Section 564(b)(1) of the Act, 21 U.S.C. section 360bbb-3(b)(1), unless the authorization is terminated or revoked sooner. Performed at [facility], [place] [address] [place], [place] [address]   Influenza panel by PCR (type A & B)     Status: None   Collection Time: 03/10/19  9:31 PM  Result Value Ref Range   Influenza A By PCR NEGATIVE NEGATIVE   Influenza B By PCR NEGATIVE NEGATIVE    Comment: (NOTE) The Xpert Xpress Flu assay is intended as an aid  in the diagnosis of  influenza and should not be used as a sole basis for treatment.  This  assay is FDA approved for nasopharyngeal swab specimens only. Nasal  washings and aspirates are unacceptable for Xpert Xpress Flu testing. Performed at [facility], [place] [address] [place], [place] [address]   Respiratory Panel by PCR     Status: None   Collection Time: 03/10/19  9:31 PM  Result Value Ref Range   Adenovirus NOT DETECTED NOT DETECTED   Coronavirus 229E NOT DETECTED NOT DETECTED    Comment: (NOTE) The Coronavirus on the Respiratory Panel, DOES NOT test for the novel  Coronavirus (2019 nCoV)    Coronavirus HKU1 NOT DETECTED NOT DETECTED   Coronavirus NL63 NOT DETECTED NOT DETECTED   Coronavirus OC43 NOT DETECTED NOT DETECTED   Metapneumovirus NOT DETECTED NOT DETECTED   Rhinovirus / Enterovirus NOT DETECTED NOT DETECTED   Influenza A  NOT DETECTED NOT DETECTED   Influenza B NOT DETECTED NOT DETECTED   Parainfluenza Virus 1 NOT DETECTED NOT DETECTED   Parainfluenza Virus 2 NOT DETECTED NOT DETECTED   Parainfluenza Virus 3 NOT DETECTED NOT DETECTED   Parainfluenza Virus 4 NOT DETECTED NOT DETECTED   Respiratory Syncytial Virus NOT DETECTED NOT DETECTED   Bordetella pertussis NOT DETECTED NOT DETECTED   Chlamydophila pneumoniae NOT DETECTED NOT DETECTED   Mycoplasma pneumoniae NOT DETECTED NOT DETECTED    Comment: Performed at [facility], [place] [address] [place], [place] [address]  Glucose, capillary     Status: Abnormal   Collection Time: 03/10/19  9:32 PM  Result Value Ref Range   Glucose-Capillary 145 (H) 70 - 99 mg/dL  Strep pneumoniae urinary antigen     Status: None   Collection Time: 03/10/19  9:33 PM  Result Value Ref Range   Strep Pneumo Urinary Antigen NEGATIVE NEGATIVE    Comment: Performed at [facility], [place] [address] [place], [place] [address]  Glucose, capillary     Status: Abnormal   Collection Time: 03/10/19 11:00 PM  Result Value Ref Range   Glucose-Capillary 172 (H) 70 - 99 mg/dL  Lactic acid, plasma     Status: None   Collection Time: 03/10/19 11:46 PM  Result Value Ref Range   Lactic Acid, Venous 0.9 0.5 - 1.9 mmol/L    Comment: Performed at [facility] [place] [address], [place], [place] [address]  Procalcitonin     Status: None   Collection Time: 03/10/19 11:46 PM  Result Value Ref Range   Procalcitonin 0.25 ng/mL    Comment:        Interpretation: PCT (Procalcitonin) <= 0.5 ng/mL: Systemic infection (sepsis) is not likely. Local bacterial infection is possible. (NOTE)       Sepsis PCT Algorithm           Lower Respiratory Tract                                      Infection PCT Algorithm    ----------------------------     ----------------------------         PCT < 0.25 ng/mL                PCT < 0.10 ng/mL         Strongly encourage              Strongly discourage  discontinuation of antibiotics    initiation of antibiotics    ----------------------------     -----------------------------       PCT 0.25 - 0.50 ng/mL            PCT 0.10 - 0.25 ng/mL               OR       >80% decrease in PCT            Discourage initiation of                                            antibiotics      Encourage discontinuation           of antibiotics    ----------------------------     -----------------------------         PCT >= 0.50 ng/mL              PCT 0.26 - 0.50 ng/mL               AND        <80% decrease in PCT             Encourage initiation of                                             antibiotics       Encourage continuation           of antibiotics    ----------------------------     -----------------------------        PCT >= 0.50 ng/mL                  PCT > 0.50 ng/mL               AND         increase in PCT                  Strongly encourage                                      initiation of antibiotics    Strongly encourage escalation           of antibiotics                                     -----------------------------                                           PCT <= 0.25 ng/mL                                                 OR                                        >  80% decrease in PCT                                     Discontinue / Do not initiate                                             antibiotics Performed at [place] [address], [place], [place] [address]   Protime-INR     Status: None   Collection Time: 03/10/19 11:46 PM  Result Value Ref Range   Prothrombin Time 12.9 11.4 - 15.2 seconds   INR 1.0 0.8 - 1.2    Comment: (NOTE) INR goal varies based on device and disease states. Performed at [facility], [place] [address] [place], [place] [address]   APTT     Status: None   Collection Time: 03/10/19 11:46 PM  Result Value Ref Range   aPTT 32  24 - 36 seconds    Comment: Performed at [facility], [place] [address], [place], [place] [address]  Comprehensive metabolic panel     Status: Abnormal   Collection Time: 03/10/19 11:46 PM  Result Value Ref Range   Sodium 140 135 - 145 mmol/L   Potassium 5.7 (H) 3.5 - 5.1 mmol/L   Chloride 115 (H) 98 - 111 mmol/L   CO2 22 22 - 32 mmol/L   Glucose, Bld 234 (H) 70 - 99 mg/dL   BUN 52 (H) 6 - 20 mg/dL   Creatinine, Ser 2.29 (H) 0.44 - 1.00 mg/dL   Calcium 7.9 (L) 8.9 - 10.3 mg/dL   Total Protein 6.1 (L) 6.5 - 8.1 g/dL   Albumin 2.9 (L) 3.5 - 5.0 g/dL   AST 11 (L) 15 - 41 U/L   ALT 11 0 - 44 U/L   Alkaline Phosphatase 95 38 - 126 U/L   Total Bilirubin 0.2 (L) 0.3 - 1.2 mg/dL   GFR calc non Af Amer 25 (L) >60 mL/min   GFR calc Af Amer 29 (L) >60 mL/min   Anion gap 3 (L) 5 - 15    Comment: Performed at [facility], [place] [address], [place], [place] [address]  C-reactive protein     Status: Abnormal   Collection Time: 03/10/19 11:46 PM  Result Value Ref Range   CRP 5.3 (H) <1.0 mg/dL    Comment: Performed at [facility], [place] [address] [place], [place] [address]  D-dimer, quantitative (not at [facility])     Status: Abnormal   Collection Time: 03/10/19 11:46 PM  Result Value Ref Range   D-Dimer, Quant 0.65 (H) 0.00 - 0.50 ug/mL-FEU    Comment: (NOTE) At the manufacturer cut-off of 0.50 ug/mL FEU, this assay has been documented to exclude PE with a sensitivity and negative predictive value of 97 to 99%.  At this time, this assay has not been approved by the FDA to exclude DVT/VTE. Results should be correlated with clinical presentation. Performed at [facility], [place] [address] [place], [place] [address]   Ferritin     Status: None   Collection Time: 03/10/19 11:46 PM  Result Value Ref Range   Ferritin 55 11 - 307 ng/mL    Comment: Performed at [facility], [place] [address]  [place],  [place] [address]  Lactate dehydrogenase     Status: None   Collection Time: 03/10/19 11:46 PM  Result Value Ref Range   LDH 142 98 - 192 U/L    Comment: Performed at [facility], [place] [address], [place], [place] [address]  Troponin I - Once     Status: None   Collection Time: 03/10/19 11:46 PM  Result Value Ref Range   Troponin I <0.03 <0.03 ng/mL    Comment: Performed at [facility], [place] [address] [place], [place] [address]  TSH     Status: None   Collection Time: 03/10/19 11:46 PM  Result Value Ref Range   TSH 0.816 0.350 - 4.500 uIU/mL    Comment: Performed by a 3rd Generation assay with a functional sensitivity of <=0.01 uIU/mL. Performed at [facility], [place] [address], [place], [place] [address]   Glucose, capillary     Status: Abnormal   Collection Time: 03/11/19  1:59 AM  Result Value Ref Range   Glucose-Capillary 196 (H) 70 - 99 mg/dL  Lactic acid, plasma     Status: None   Collection Time: 03/11/19  3:08 AM  Result Value Ref Range   Lactic Acid, Venous 0.7 0.5 - 1.9 mmol/L    Comment: Performed at [facility], [place] [address] [place], [place] [address]  CBC with Differential/Platelet     Status: Abnormal   Collection Time: 03/11/19  3:08 AM  Result Value Ref Range   WBC 4.4 4.0 - 10.5 K/uL   RBC 2.93 (L) 3.87 - 5.11 MIL/uL   Hemoglobin 8.3 (L) 12.0 - 15.0 g/dL   HCT 27.3 (L) 36.0 - 46.0 %   MCV 93.2 80.0 - 100.0 fL   MCH 28.3 26.0 - 34.0 pg   MCHC 30.4 30.0 - 36.0 g/dL   RDW 13.9 11.5 - 15.5 %   Platelets 98 (L) 150 - 400 K/uL    Comment: REPEATED TO VERIFY PLATELET COUNT CONFIRMED BY SMEAR SPECIMEN CHECKED FOR CLOTS Immature Platelet Fraction may be clinically indicated, consider ordering this additional test [id number]    nRBC 0.0 0.0 - 0.2 %   Neutrophils Relative % 70 %   Neutro Abs 3.1 1.7 - 7.7 K/uL   Lymphocytes Relative 23 %   Lymphs Abs 1.0 0.7 - 4.0 K/uL   Monocytes Relative  6 %   Monocytes Absolute 0.3 0.1 - 1.0 K/uL   Eosinophils Relative 1 %   Eosinophils Absolute 0.0 0.0 - 0.5 K/uL   Basophils Relative 0 %   Basophils Absolute 0.0 0.0 - 0.1 K/uL   Immature Granulocytes 0 %   Abs Immature Granulocytes 0.01 0.00 - 0.07 K/uL    Comment: Performed at [facility], [place] [address], [place],  [address]  Comprehensive metabolic panel     Status: Abnormal   Collection Time: 03/11/19  3:08 AM  Result Value Ref Range   Sodium 141 135 - 145 mmol/L   Potassium 5.6 (H) 3.5 - 5.1 mmol/L   Chloride 115 (H) 98 - 111 mmol/L   CO2 22 22 - 32 mmol/L   Glucose, Bld 201 (H) 70 - 99 mg/dL   BUN 53 (H) 6 - 20 mg/dL   Creatinine, Ser 2.24 (H) 0.44 - 1.00 mg/dL   Calcium 8.0 (L) 8.9 - 10.3 mg/dL   Total Protein 5.9 (L) 6.5 - 8.1 g/dL   Albumin 3.0 (L) 3.5 - 5.0 g/dL  AST 10 (L) 15 - 41 U/L   ALT 11 0 - 44 U/L   Alkaline Phosphatase 85 38 - 126 U/L   Total Bilirubin 0.5 0.3 - 1.2 mg/dL   GFR calc non Af Amer 25 (L) >60 mL/min   GFR calc Af Amer 30 (L) >60 mL/min   Anion gap 4 (L) 5 - 15    Comment: Performed at [facility], [place] [address] [place], [place] [address]  C-reactive protein     Status: Abnormal   Collection Time: 03/11/19  3:08 AM  Result Value Ref Range   CRP 5.3 (H) <1.0 mg/dL    Comment: Performed at [facility], [place] [address] [place], [place] [address]  Magnesium     Status: None   Collection Time: 03/11/19  3:08 AM  Result Value Ref Range   Magnesium 2.3 1.7 - 2.4 mg/dL    Comment: Performed at [facility], [place] [address] [place], [place] [address]  Phosphorus     Status: None   Collection Time: 03/11/19  3:08 AM  Result Value Ref Range   Phosphorus 3.2 2.5 - 4.6 mg/dL    Comment: Performed at [facility], [place] [address], [place], [place] [address]  Glucose, capillary     Status: Abnormal   Collection Time: 03/11/19  4:02 AM  Result Value  Ref Range   Glucose-Capillary 160 (H) 70 - 99 mg/dL  Glucose, capillary     Status: Abnormal   Collection Time: 03/11/19  6:02 AM  Result Value Ref Range   Glucose-Capillary 141 (H) 70 - 99 mg/dL  Glucose, capillary     Status: Abnormal   Collection Time: 03/11/19  8:04 AM  Result Value Ref Range   Glucose-Capillary 123 (H) 70 - 99 mg/dL  Glucose, capillary     Status: Abnormal   Collection Time: 03/11/19 12:12 PM  Result Value Ref Range   Glucose-Capillary 188 (H) 70 - 99 mg/dL    Current Facility-Administered Medications  Medication Dose Route Frequency Provider Last Rate Last Dose  . 0.9 %  sodium chloride infusion   Intravenous Continuous Nita Sells, MD 125 mL/hr at 03/11/19 1003    . aspirin EC tablet 81 mg  81 mg Oral Daily Doutova, Anastassia, MD   81 mg at 03/11/19 1007  . atorvastatin (LIPITOR) tablet 80 mg  80 mg Oral Daily Doutova, Anastassia, MD   80 mg at 03/11/19 1006  . atropine 1 % ophthalmic solution 1 drop  1 drop Right Eye BID Toy Baker, MD   1 drop at 03/11/19 1007  . azithromycin (ZITHROMAX) 500 mg in sodium chloride 0.9 % 250 mL IVPB  500 mg Intravenous Q24H Doutova, Anastassia, MD      . buPROPion (WELLBUTRIN SR) 12 hr tablet 150 mg  150 mg Oral Daily Alfie Rideaux, MD      . buPROPion (WELLBUTRIN XL) 24 hr tablet 150 mg  150 mg Oral Daily Cashae Weich, MD      . cefTRIAXone (ROCEPHIN) 2 g in sodium chloride 0.9 % 100 mL IVPB  2 g Intravenous Q24H Doutova, Anastassia, MD      . clopidogrel (PLAVIX) tablet 75 mg  75 mg Oral Daily Doutova, Anastassia, MD   75 mg at 03/11/19 1006  . dorzolamide-timolol (COSOPT) 22.3-6.8 MG/ML ophthalmic solution 1 drop  1 drop Left Eye BID Toy Baker, MD   1 drop at 03/11/19 1008  . enoxaparin (LOVENOX) injection 30 mg  30 mg Subcutaneous QHS Shela Leff, MD   30 mg at 03/10/19 2258  . guaiFENesin-dextromethorphan (ROBITUSSIN DM) 100-10 MG/5ML syrup 10 mL  10 mL Oral Q4H PRN Doutova,  Anastassia, MD      . insulin detemir (LEVEMIR) injection 15 Units  15 Units Subcutaneous Daily Nita Sells, MD   15 Units at 03/11/19 1300  . latanoprost (XALATAN) 0.005 % ophthalmic solution 1 drop  1 drop Both Eyes QPM Doutova, Anastassia, MD   1 drop at 03/10/19 2258  . levothyroxine (SYNTHROID) tablet 50 mcg  50 mcg Oral Q0600 Toy Baker, MD   50 mcg at 03/11/19 0659  . ondansetron (ZOFRAN) tablet 4 mg  4 mg Oral Q6H PRN Doutova, Anastassia, MD      . pantoprazole (PROTONIX) EC tablet 40 mg  40 mg Oral Daily Doutova, Anastassia, MD   40 mg at 03/11/19 1006  . polyethylene glycol (MIRALAX / GLYCOLAX) packet 17 g  17 g Oral Daily Nita Sells, MD   17 g at 03/11/19 1019  . prednisoLONE acetate (PRED FORTE) 1 % ophthalmic suspension 1 drop  1 drop Left Eye BID Toy Baker, MD   1 drop at 03/11/19 1008  . pregabalin (LYRICA) capsule 75 mg  75 mg Oral BID Toy Baker, MD   75 mg at 03/11/19 1006  . sertraline (ZOLOFT) tablet 100 mg  100 mg Oral Daily Doutova, Anastassia, MD   100 mg at 03/11/19 1006    Musculoskeletal: Strength & Muscle Tone: within normal limits Gait & Station: normal Patient leans: N/A  Psychiatric Specialty Exam: Physical Exam  Psychiatric: Her speech is normal and behavior is normal. Judgment and thought content normal. Her mood appears anxious. Cognition and memory are normal. She exhibits a depressed mood.    Review of Systems  Constitutional: Positive for malaise/fatigue.  HENT: Negative.   Eyes: Positive for blurred vision.  Respiratory: Positive for shortness of breath.   Cardiovascular: Negative.   Gastrointestinal: Negative.   Genitourinary: Negative.   Musculoskeletal: Positive for myalgias.  Skin: Negative.   Neurological: Negative.   Endo/Heme/Allergies: Negative.   Psychiatric/Behavioral: The patient is nervous/anxious.     Blood pressure (!) 159/61, pulse 81, temperature 99.1 F (37.3 C), temperature source  Oral, resp. rate 20, height 5\' 2"  (1.575 m), weight 84.7 kg, SpO2 96 %.Body mass index is 34.15 kg/m.  General Appearance: Casual  Eye Contact:  Good  Speech:  Clear and Coherent  Volume:  Normal  Mood:  Dysphoric  Affect:  Appropriate  Thought Process:  Coherent and Linear  Orientation:  Full (Time, Place, and Person)  Thought Content:  Logical  Suicidal Thoughts:  No  Homicidal Thoughts:  No  Memory:  Immediate;   Fair Recent;   Fair Remote;   Fair  Judgement:  Intact  Insight:  Fair  Psychomotor Activity:  Psychomotor Retardation  Concentration:  Concentration: Fair and Attention Span: Fair  Recall:  Good  Fund of Knowledge:  Good  Language:  Good  Akathisia:  No  Handed:  Right  AIMS (if indicated):     Assets:  Communication Skills Desire for Improvement Social Support  ADL's:  Intact  Cognition:  WNL  Sleep:   fair     Treatment Plan Summary: 48 year old female with history of multiple medical issues and depression who was transferred from behavior health hospital 3 days ago  to medical ICU due to fever, hypoglycemia and hypotension. Today, patient is calm , cooperative and denies psychosis, suicidal  ideations and delusions. Patient is cleared by psychiatrist, she is no longer a danger to herself or other.  Recommendations: -Continue Zoloft 100 mg daily for anxiety. -Add Wellbutrin XL 150 mg daily for depression. -Consider social worker consult to facilitate patient's referral to a psychiatrist/therapist upon discharge.   Disposition: No evidence of imminent risk to self or others at present.   Patient does not meet criteria for psychiatric inpatient admission. Supportive therapy provided about ongoing stressors. Psychiatric service is signing out. Re-consult as needed.  Corena Pilgrim, MD 03/11/2019 1:03 PM

## 2019-03-12 ENCOUNTER — Inpatient Hospital Stay (HOSPITAL_COMMUNITY): Payer: Medicare HMO

## 2019-03-12 DIAGNOSIS — F4323 Adjustment disorder with mixed anxiety and depressed mood: Secondary | ICD-10-CM

## 2019-03-12 LAB — COMPREHENSIVE METABOLIC PANEL
ALT: 8 U/L (ref 0–44)
AST: 12 U/L — ABNORMAL LOW (ref 15–41)
Albumin: 2.9 g/dL — ABNORMAL LOW (ref 3.5–5.0)
Alkaline Phosphatase: 78 U/L (ref 38–126)
Anion gap: 6 (ref 5–15)
BUN: 32 mg/dL — ABNORMAL HIGH (ref 6–20)
CO2: 22 mmol/L (ref 22–32)
Calcium: 8.4 mg/dL — ABNORMAL LOW (ref 8.9–10.3)
Chloride: 111 mmol/L (ref 98–111)
Creatinine, Ser: 1.85 mg/dL — ABNORMAL HIGH (ref 0.44–1.00)
GFR calc Af Amer: 37 mL/min — ABNORMAL LOW (ref >60)
GFR calc non Af Amer: 32 mL/min — ABNORMAL LOW (ref >60)
Glucose, Bld: 191 mg/dL — ABNORMAL HIGH (ref 70–99)
Potassium: 4.9 mmol/L (ref 3.5–5.1)
Sodium: 139 mmol/L (ref 135–145)
Total Bilirubin: 0.4 mg/dL (ref 0.3–1.2)
Total Protein: 6.3 g/dL — ABNORMAL LOW (ref 6.5–8.1)

## 2019-03-12 LAB — CBC WITH DIFFERENTIAL/PLATELET
Abs Immature Granulocytes: 0.01 10*3/uL (ref 0.00–0.07)
Basophils Absolute: 0 10*3/uL (ref 0.0–0.1)
Basophils Relative: 0 %
Eosinophils Absolute: 0.1 10*3/uL (ref 0.0–0.5)
Eosinophils Relative: 2 %
HCT: 28.2 % — ABNORMAL LOW (ref 36.0–46.0)
Hemoglobin: 8.8 g/dL — ABNORMAL LOW (ref 12.0–15.0)
Immature Granulocytes: 0 %
Lymphocytes Relative: 17 %
Lymphs Abs: 0.8 10*3/uL (ref 0.7–4.0)
MCH: 28.1 pg (ref 26.0–34.0)
MCHC: 31.2 g/dL (ref 30.0–36.0)
MCV: 90.1 fL (ref 80.0–100.0)
Monocytes Absolute: 0.3 10*3/uL (ref 0.1–1.0)
Monocytes Relative: 6 %
Neutro Abs: 3.7 10*3/uL (ref 1.7–7.7)
Neutrophils Relative %: 75 %
Platelets: 123 10*3/uL — ABNORMAL LOW (ref 150–400)
RBC: 3.13 MIL/uL — ABNORMAL LOW (ref 3.87–5.11)
RDW: 13.5 % (ref 11.5–15.5)
WBC: 5 10*3/uL (ref 4.0–10.5)
nRBC: 0 % (ref 0.0–0.2)

## 2019-03-12 LAB — GLUCOSE, CAPILLARY
Glucose-Capillary: 165 mg/dL — ABNORMAL HIGH (ref 70–99)
Glucose-Capillary: 311 mg/dL — ABNORMAL HIGH (ref 70–99)
Glucose-Capillary: 202 mg/dL — ABNORMAL HIGH (ref 70–99)
Glucose-Capillary: 212 mg/dL — ABNORMAL HIGH (ref 70–99)

## 2019-03-12 LAB — TSH: TSH: 1.102 u[IU]/mL (ref 0.350–4.500)

## 2019-03-12 MED ORDER — PIPERACILLIN-TAZOBACTAM 3.375 G IVPB
3.3750 g | Freq: Three times a day (TID) | INTRAVENOUS | Status: DC
  Administered 2019-03-12 – 2019-03-14 (×6): 3.375 g via INTRAVENOUS
  Filled 2019-03-12 (×6): qty 50

## 2019-03-12 MED ORDER — SODIUM CHLORIDE 0.9 % IV SOLN
INTRAVENOUS | Status: DC | PRN
  Administered 2019-03-12: 21:00:00 via INTRAVENOUS
  Administered 2019-03-12: 1000 mL via INTRAVENOUS
  Administered 2019-03-13: 07:00:00 via INTRAVENOUS

## 2019-03-12 MED ORDER — ENOXAPARIN SODIUM 40 MG/0.4ML ~~LOC~~ SOLN
40.0000 mg | Freq: Every day | SUBCUTANEOUS | Status: DC
  Administered 2019-03-12 – 2019-03-15 (×4): 40 mg via SUBCUTANEOUS
  Filled 2019-03-12 (×4): qty 0.4

## 2019-03-12 MED ORDER — PIPERACILLIN-TAZOBACTAM 3.375 G IVPB 30 MIN
3.3750 g | Freq: Once | INTRAVENOUS | Status: AC
  Administered 2019-03-12: 3.375 g via INTRAVENOUS
  Filled 2019-03-12 (×2): qty 50

## 2019-03-12 MED ORDER — ACETAMINOPHEN 500 MG PO TABS
1000.0000 mg | ORAL_TABLET | Freq: Once | ORAL | Status: AC
  Administered 2019-03-12: 02:00:00 1000 mg via ORAL
  Filled 2019-03-12: qty 2

## 2019-03-12 MED ORDER — ACETAMINOPHEN 325 MG PO TABS
650.0000 mg | ORAL_TABLET | Freq: Four times a day (QID) | ORAL | Status: DC | PRN
  Administered 2019-03-12: 650 mg via ORAL
  Filled 2019-03-12: qty 2

## 2019-03-12 MED ORDER — AZITHROMYCIN 250 MG PO TABS
500.0000 mg | ORAL_TABLET | Freq: Every day | ORAL | Status: DC
  Administered 2019-03-12: 500 mg via ORAL
  Filled 2019-03-12: qty 2

## 2019-03-12 NOTE — Progress Notes (Signed)
PHARMACIST - PHYSICIAN COMMUNICATION  CONCERNING: Antibiotic IV to Oral Route Change Policy  RECOMMENDATION: This patient is receiving azithromycin by the intravenous route.  Based on criteria approved by the Pharmacy and Therapeutics Committee, the antibiotic(s) is/are being converted to the equivalent oral dose form(s).   DESCRIPTION: These criteria include:  Patient being treated for a respiratory tract infection, urinary tract infection, cellulitis or clostridium difficile associated diarrhea if on metronidazole  The patient is not neutropenic and does not exhibit a GI malabsorption state  The patient is eating (either orally or via tube) and/or has been taking other orally administered medications for a least 24 hours  The patient is improving clinically and has a Tmax < 100.5  If you have questions about this conversion, please contact the Pharmacy Department  []  ( [phone] )  [facility] []  ( [phone]  [facility] []  ( [phone]  [facility] []  ( [phone] )  [facility] [x]  ( [phone] )  [facility]  

## 2019-03-12 NOTE — Progress Notes (Signed)
Pharmacy Brief Note: Renal Dose Adjustment  Based on CrCl > 30 mL/min and TBW > 45 kg, increased enoxaparin from 30 mg subQ daily to 40 mg subQ daily for DVT ppx.   Lenis Noon, PharmD 03/12/19 2:44 PM

## 2019-03-12 NOTE — Progress Notes (Signed)
CRITICAL VALUE ALERT  Critical Value:  Temp 103  Date & Time Notied: 03-12-2019 @ 0114  Provider Notified: Silas Sacramento, NP  Orders Received/Actions taken: Tylenol

## 2019-03-12 NOTE — Progress Notes (Signed)
SATURATION QUALIFICATIONS: (This note is used to comply with regulatory documentation for home oxygen)  Patient Saturations on Room Air at Rest = 87%  Patient Saturations on Room Air while Ambulating = n/a  Patient Saturations on 2 Liters of oxygen while Ambulating = 87-89%, up to 4 liters 90-94%   Please briefly explain why patient needs home oxygen: desats even at rest without oxygen supplementation; patient attempted to walk to desk, nauseated (ambulated approximately 30 feet).

## 2019-03-12 NOTE — Progress Notes (Addendum)
Inpatient Diabetes Program Recommendations  AACE/ADA: New Consensus Statement on Inpatient Glycemic Control (2015)  Target Ranges:  Prepandial:   less than 140 mg/dL      Peak postprandial:   less than 180 mg/dL (1-2 hours)      Critically ill patients:  140 - 180 mg/dL   Lab Results  Component Value Date   GLUCAP 311 (H) 03/12/2019   HGBA1C 6.8 (H) 03/10/2019    Review of Glycemic Control  Inpatient Diabetes Program Recommendations:   Reviewed CBGs. Please consider: -Increase Levemir to 20  -Novolog MC tid to 8 units if eats 50%  Thank you, [name]. Aubria Vanecek, RN, MSN, CDE  Diabetes Coordinator Inpatient Glycemic Control Team Team Pager [phone] (8am-5pm) 03/12/2019 11:39 AM

## 2019-03-12 NOTE — Progress Notes (Signed)
In and out cath performed to obtain urine culture at Md request.  300 ccs yellow urine obtained, patient tolerated well.

## 2019-03-12 NOTE — Progress Notes (Signed)
[facility] PROGRESS NOTE  Deanna Landry WJX:[id number] DOB: 01-Mar-1971 DOA: 03/10/2019 PCP: Cher Nakai, MD   Narrative: 76 ? 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 Unilateral blindness right eye secondary to diabetic retinopathy Diabetic nephropathy-she does not know her baseline kidney function  Patient moved here from [place] last year where she was living with her daughter and grandchild to live with her mother who has multiple cardiac issues She lives in [place]  She has history of suicidality in the past but she also does have a history of schizophrenia and "hearing voices     A & Plan  Sepsis query cause CXR 2vw new Multilobar PNA--?might of aspirated Fever 103-ceftriaxone azithromycin -->zosyn 4/26 Might need to downgrade to dysphagia 2 diet , SLP input am ?  Suicidality ?  Overdose insulin Discussed with Dr. Darleene Cleaver psychiatry-patient is cleared form SI perspective and will follow as OP Diabetes mellitus type 1 since age 60 Diabetic nephropathy CBGs elevated--now improved on mod CBG coverage Only eating about 40% of her meals We will resume Levemir at 15 every morning Continue Lyrica 75 twice daily At home takes insulin 10 3 times daily Trulicity 1.5 weekly Levemir 30 every morning AKI multi-factorial inc DM nephropathy Hyperkalemia Discontinue ACE forever Renally dose some of her meds based on GFR Stop IVF today Prior CVA?/MI Risk factors include diabetes hypertension obesity-continue Plavix 75 daily Hypothyroidism Continue Synthroid 50 mcg daily  TSH wnl BMI 34    DVT Lovenox code Status: Presumed full code communication: Called mom on mphone and updated disposition Plan: Inpatient   [name], MD  [facility] Via [employer] app OR [email or web address] 7PM-7AM contact night coverage as above 03/12/2019, 10:10 AM  LOS: 2 days     Interval history/Subjective:  Taking on phone Appears to have  needed onxygen on transfer from SDU yesterday which I wasn't made aware of Tmax 103 overnight Some n, no vomit, no diarr Some lower belly tenderness  Objective:  Vitals:  Vitals:   03/12/19 0426 03/12/19 0958  BP: 139/64   Pulse: 74   Resp: 18   Temp: 100.1 F (37.8 C) 99.3 F (37.4 C)  SpO2: 94%     Exam:  EOMI NCAT visual acuity on right side significantly diminished however she is able to see on left side how things are going Neck soft supple S1-S2 no murmur rub or gallop Abdomen obese nontender nondistended no rebound No lower extremity edema Neurologically intact Flat affect   I have personally reviewed the following:  DATA   Labs:  BUN/creatinine down from 64/2.9--->32/1.8  Potassium up from 5.6-->4.9  CRP 5.3  Lactic acid down from 1.1-0.7  Procalcitonin 0.25  Imaging studies:  Chest x-ray 2 vw    Scheduled Meds: . aspirin EC  81 mg Oral Daily  . atorvastatin  80 mg Oral Daily  . atropine  1 drop Right Eye BID  . buPROPion  150 mg Oral Daily  . clopidogrel  75 mg Oral Daily  . dorzolamide-timolol  1 drop Left Eye BID  . enoxaparin (LOVENOX) injection  40 mg Subcutaneous QHS  . insulin aspart  0-15 Units Subcutaneous TID WC  . insulin aspart  0-5 Units Subcutaneous QHS  . insulin aspart  4 Units Subcutaneous TID WC  . insulin detemir  15 Units Subcutaneous Daily  . latanoprost  1 drop Both Eyes QPM  . levothyroxine  50 mcg Oral Q0600  . pantoprazole  40 mg Oral Daily  . polyethylene glycol  17 g Oral Daily  . prednisoLONE acetate  1 drop Left Eye BID  . pregabalin  75 mg Oral BID  . sertraline  100 mg Oral Daily   Continuous Infusions: . cefTRIAXone (ROCEPHIN)  IV Stopped (03/11/19 1416)    Principal Problem:   Adjustment disorder with mixed anxiety and depressed mood Active Problems:   MDD (major depressive disorder), severe (HCC)   Fever   Sepsis ([place])   LOS: 2 days

## 2019-03-12 NOTE — Progress Notes (Signed)
Pharmacy Antibiotic Note  Deanna Landry is a 48 y.o. female admitted on 03/10/2019 with aspiration PNA.  Pharmacy has been consulted for piperacillin/tazobactam dosing.  Patient on day #3 of IV antibiotics on ceftriaxone and azithromycin. Antibiotics being changed to piperacillin/tazobactam for aspiration PNA.  Today, 03/12/19 -WBC WNL -SCr 1.85, CrCl ~38 mL/min. Renal function improving -Tmax 103 F  Plan:  Piperacillin/tazobactam 3.375 g IV q8h EI  Follow culture data and renal function  Height: 5\' 2"  (157.5 cm) Weight: 186 lb 11.7 oz (84.7 kg) IBW/kg (Calculated) : 50.1  Temp (24hrs), Avg:100.5 F (38.1 C), Min:99.3 F (37.4 C), Max:103 F (39.4 C)  Recent Labs  Lab 03/10/19 1148 03/10/19 2346 03/11/19 0308 03/12/19 0423  WBC 6.9  --  4.4 5.0  CREATININE 2.95* 2.29* 2.24* 1.85*  LATICACIDVEN 1.1 0.9 0.7  --     Estimated Creatinine Clearance: 38.3 mL/min (A) (by C-G formula based on SCr of 1.85 mg/dL (H)).    No Known Allergies  Antimicrobials this admission: ceftriaxone 4/24 >> 4/26 azithromycin 4/24 >> 4/26 Piperacillin/tazobactam 4/26 >>  Dose adjustments this admission:  Microbiology results: 4/26 Urine: Sent 4/26 Blood: Sent 4/24 Respiratory panel: negative 4/24 COVID-19: Negative  Thank you for allowing pharmacy to be a part of this patient's care.  Lenis Noon, PharmD 03/12/2019 3:26 PM

## 2019-03-13 LAB — URINE CULTURE: Culture: NO GROWTH

## 2019-03-13 LAB — GLUCOSE, CAPILLARY
Glucose-Capillary: 241 mg/dL — ABNORMAL HIGH (ref 70–99)
Glucose-Capillary: 317 mg/dL — ABNORMAL HIGH (ref 70–99)
Glucose-Capillary: 260 mg/dL — ABNORMAL HIGH (ref 70–99)
Glucose-Capillary: 270 mg/dL — ABNORMAL HIGH (ref 70–99)

## 2019-03-13 MED ORDER — LORAZEPAM 1 MG PO TABS
1.0000 mg | ORAL_TABLET | Freq: Once | ORAL | Status: AC
  Administered 2019-03-13: 1 mg via ORAL
  Filled 2019-03-13: qty 1

## 2019-03-13 MED ORDER — INSULIN DETEMIR 100 UNIT/ML ~~LOC~~ SOLN
26.0000 [IU] | Freq: Every day | SUBCUTANEOUS | Status: DC
  Filled 2019-03-13: qty 0.26

## 2019-03-13 NOTE — Progress Notes (Signed)
Inpatient Diabetes Program Recommendations  AACE/ADA: New Consensus Statement on Inpatient Glycemic Control (2015)  Target Ranges:  Prepandial:   less than 140 mg/dL      Peak postprandial:   less than 180 mg/dL (1-2 hours)      Critically ill patients:  140 - 180 mg/dL   Results for SAMEERAH, NACHTIGAL (MRN [id number]) as of 03/13/2019 09:46  Ref. Range 03/12/2019 07:43 03/12/2019 11:29 03/12/2019 16:36 03/12/2019 22:54 03/13/2019 07:33  Glucose-Capillary Latest Ref Range: 70 - 99 mg/dL 165 (H) Novolog 3 units + 4 meal coverage given + Lantus 15 units given 311 (H) Novolog 11 units given 202 (H) Novolog 5 units given 212 (H) Novolog 2 units given 241 (H) Novolog 5 units given + Lantus 15 units given   Review of Glycemic Control  Diabetes history: DM 2 Outpatient Diabetes medications: Levemir 30 units Daily, Novolog 10 units tid, Trulicity 1.5 mg Q Monday Current orders for Inpatient glycemic control: Levemir 15 units Daily, Novolog 0-15 units tid, Novolog 0-5 units qhs, Novolog 4 units tid meal coverage  Inpatient Diabetes Program Recommendations:    Glucose trends elevated. Patient takes more basal insulin at home patient may need an increase in Levemir, however, meal coverage not given consistently yesterday. PO intake not documented. Patient may have variable PO intake as RN documented order parameters not met.  Will follow trends.  Thanks,  Tama Headings RN, MSN, BC-ADM Inpatient Diabetes Coordinator Team Pager [phone] (8a-5p)

## 2019-03-13 NOTE — Evaluation (Signed)
Clinical/Bedside Swallow Evaluation Patient Details  Name: Deanna Landry MRN: [id number] Date of Birth: 1971-11-03  Today's Date: 03/13/2019 Time: SLP Start Time (ACUTE ONLY): 1343 SLP Stop Time (ACUTE ONLY): 1415 SLP Time Calculation (min) (ACUTE ONLY): 32 min  Past Medical History:  Past Medical History:  Diagnosis Date  . CHF (congestive heart failure) ([place])   . Diabetes mellitus without complication ([place])   . Hypertension   . Myocardial infarct, old   . Renal disorder   . Stroke [facility])    Past Surgical History:  Past Surgical History:  Procedure Laterality Date  . ABDOMINAL HYSTERECTOMY    . EYE SURGERY    . TOE AMPUTATION     HPI:  48 yo female adm to [facility] with multilobar pna and fever.  PMH + for AKI, DM1 since age 61 per pt, DM neuropathy, OD on insulin, depression, CHF, CVA, MI.  She reports beging on reflux medications and denies reflux.  She does admit to dysphagia symptoms with sensing food or drink lodge pointing to distal esophagus.  She states she was intubated a few years ago with pna and had dysphagia and was deconditioned requiring SNF placement.  Pt also admits to having discomfort with her bowels since she had her hysterectomy = stating she had colitis one week after surgery and problems with discomfort (feels like sandpaper in her bowels).  Swallow evaluation ordered.     Assessment / Plan / Recommendation Clinical Impression  Patient presents with functional oropharyngeal swallow ability.  No indication of aspiration with po observed (3 ounces water, gingerale, applesauce).  She did have belching immediately post-swallow of 3 ounces water.  She also reports sensation of items lodging in distal esophagus - and states that she has to raise her chest to help food/drink transit.  SLP is concerned pt's aspiration risk may be primarily due to possible esophageal dysphagia *? consistent with dysmotiltiy?   She also reports problems with hoarseness.  Advised pt to eat several  small meals day - eating warm foods on tray first and saving those that will stay at room temperature for later.  SLP will follow up to help educate pt to possible mitigation strategies.  SLP Visit Diagnosis: Dysphagia, unspecified (R13.10)    Aspiration Risk  Mild aspiration risk    Diet Recommendation Regular(to allow her to chose foods she can manage)   Liquid Administration via: Cup Medication Administration: Whole meds with liquid Supervision: Patient able to self feed Compensations: Slow rate;Small sips/bites Postural Changes: Seated upright at 90 degrees;Remain upright for at least 30 minutes after po intake    Other  Recommendations Oral Care Recommendations: Oral care BID   Follow up Recommendations        Frequency and Duration    1 week       Prognosis Prognosis for Safe Diet Advancement: Fair      Swallow Study   General Date of Onset: 03/13/19 HPI: 48 yo female adm to [facility] with multilobar pna and fever.  PMH + for AKI, DM1 since age 29 per pt, DM neuropathy, OD on insulin, depression, CHF, CVA, MI.  She reports beging on reflux medications and denies reflux.  She does admit to dysphagia symptoms with sensing food or drink lodge pointing to distal esophagus.  She states she was intubated a few years ago with pna and had dysphagia and was deconditioned requiring SNF placement.  Pt also admits to having discomfort with her bowels since she had her hysterectomy = stating  she had colitis one week after surgery and problems with discomfort (feels like sandpaper in her bowels).  Swallow evaluation ordered.   Type of Study: Bedside Swallow Evaluation Diet Prior to this Study: Thin liquids;Regular Temperature Spikes Noted: Yes(101.8) History of Recent Intubation: No Behavior/Cognition: Alert;Cooperative;Pleasant mood Oral Cavity Assessment: Within Functional Limits Oral Cavity - Dentition: Adequate natural dentition Vision: Functional for self-feeding Self-Feeding Abilities:  Able to feed self Patient Positioning: Upright in bed Baseline Vocal Quality: (pt states she is hoarse) Volitional Cough: Weak Volitional Swallow: Able to elicit    Oral/Motor/Sensory Function     Ice Chips Ice chips: Not tested   Thin Liquid Thin Liquid: Within functional limits Presentation: Cup;Self Fed    Nectar Thick Nectar Thick Liquid: Not tested   Honey Thick Honey Thick Liquid: Not tested   Puree Puree: Within functional limits Presentation: Self Fed;Spoon   Solid     Solid: Not tested Other Comments: did not provide pt with graham cracker due to her blood sugar levels      Macario Golds 03/13/2019,2:49 PM   Luanna Salk, [facility] SLP Acute Rehab Services Pager [phone] Office [phone]

## 2019-03-13 NOTE — Progress Notes (Signed)
This chaplain responded to Pt. spiritual care consult after checking in with Pt. RN-Lance.  The chaplain was pleasantly greeted by the Pt.  The Pt. declined a visit at this time.  The Pt. desires future spiritual care visits per Pt. request through the RN.

## 2019-03-13 NOTE — Progress Notes (Signed)
[facility]ITAL[facility] PROGRESS NOTE  Deanna Landry [id number] DOB: [birth year] DOA: 03/10/2019 PCP: Cher Nakai, MD   Narrative: 48 ? 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 Fever 103-ceftriaxone azithromycin -->zosyn 4/26 Going for Esophogram today based on speech therapy input Probably does aspirate ?  Suicidality ?  Overdose insulin Discussed with Dr. Darleene Cleaver psychiatry-patient is cleared form SI perspective and will follow as OP Diabetes mellitus type 1 since age 35 Diabetic nephropathy DM gastroparesis CBGs elevated--now improved on mod CBG coverage eating fair--Nausea persists We will resume Levemir at 15--->26 to start on 4/28 Continue Lyrica 75 twice daily At home takes insulin 10 3 times daily Trulicity 1.5 weekly Levemir 30 every morning Sugars 212-314 AKI multi-factorial inc DM nephropathy Hyperkalemia Discontinue ACE forever Labs am Prior CVA?/MI Risk factors include diabetes hypertension obesity-continue Plavix 75 daily Hypothyroidism Continue Synthroid 50 mcg daily  TSH wnl OP repeat in 2-4 weeks BMI 34    DVT Lovenox code Status: Presumed full code  communication:  disposition Plan: Inpatient   Deanna Velardi, MD  [facility] Via [employer] app OR [email or web address] 7PM-7AM contact night coverage as above 03/13/2019, 3:07 PM  LOS: 3 days     Interval history/Subjective: Afebrile today but having persisting nausea Could not  eat much Playing solitaire on her phone although still some discomfort in her belly at times No diarrhea No chest pain Some improvement since yesterday   Objective:  Vitals:  Vitals:   03/13/19 0639 03/13/19 1429  BP:  (!) 162/62  Pulse:  76  Resp:  16  Temp: 99.8 F (37.7 C) 99.4 F (37.4 C)  SpO2: 95% 97%    Exam:  EOMI NCAT S1-S2 no murmur rub or gallop Abdomen obese slightly tender in epigastrium Chest has no rales no rhonchi decreased air entry bilaterally posterolaterally No lower extremity edema Neurologically intact Flat affect   I have personally reviewed the following:  DATA   Labs: None no labs today    Scheduled Meds: . aspirin EC  81 mg Oral Daily  . atorvastatin  80 mg Oral Daily  . atropine  1 drop Right Eye BID  . buPROPion  150 mg Oral Daily  . clopidogrel  75 mg Oral Daily  . dorzolamide-timolol  1 drop Left Eye BID  . enoxaparin (LOVENOX) injection  40 mg Subcutaneous QHS  . insulin aspart  0-15 Units Subcutaneous TID WC  . insulin aspart  0-5 Units Subcutaneous QHS  . insulin aspart  4 Units Subcutaneous TID WC  . insulin detemir  15 Units Subcutaneous Daily  . latanoprost  1 drop Both Eyes QPM  . levothyroxine  50 mcg Oral Q0600  . pantoprazole  40 mg Oral Daily  . polyethylene glycol  17 g Oral Daily  . prednisoLONE acetate  1 drop Left Eye BID  . pregabalin  75 mg Oral BID  . sertraline  100 mg Oral Daily   Continuous Infusions: . sodium chloride 10  mL/hr at 03/13/19 4373  . piperacillin-tazobactam (ZOSYN)  IV 3.375 g (03/13/19 1428)    Principal Problem:   Adjustment disorder with mixed anxiety and depressed mood Active Problems:   MDD (major depressive disorder), severe (HCC)   Fever   Sepsis ([place])   LOS: 3 days

## 2019-03-14 ENCOUNTER — Inpatient Hospital Stay (HOSPITAL_COMMUNITY): Payer: Medicare HMO

## 2019-03-14 LAB — RENAL FUNCTION PANEL
Albumin: 2.8 g/dL — ABNORMAL LOW (ref 3.5–5.0)
Anion gap: 9 (ref 5–15)
BUN: 23 mg/dL — ABNORMAL HIGH (ref 6–20)
CO2: 23 mmol/L (ref 22–32)
Calcium: 8.5 mg/dL — ABNORMAL LOW (ref 8.9–10.3)
Chloride: 108 mmol/L (ref 98–111)
Creatinine, Ser: 2.04 mg/dL — ABNORMAL HIGH (ref 0.44–1.00)
GFR calc Af Amer: 33 mL/min — ABNORMAL LOW (ref >60)
GFR calc non Af Amer: 29 mL/min — ABNORMAL LOW (ref >60)
Glucose, Bld: 239 mg/dL — ABNORMAL HIGH (ref 70–99)
Phosphorus: 3.5 mg/dL (ref 2.5–4.6)
Potassium: 4.6 mmol/L (ref 3.5–5.1)
Sodium: 140 mmol/L (ref 135–145)

## 2019-03-14 LAB — GLUCOSE, CAPILLARY
Glucose-Capillary: 285 mg/dL — ABNORMAL HIGH (ref 70–99)
Glucose-Capillary: 264 mg/dL — ABNORMAL HIGH (ref 70–99)
Glucose-Capillary: 333 mg/dL — ABNORMAL HIGH (ref 70–99)
Glucose-Capillary: 203 mg/dL — ABNORMAL HIGH (ref 70–99)

## 2019-03-14 MED ORDER — INSULIN DETEMIR 100 UNIT/ML ~~LOC~~ SOLN: 30.0000 [IU] | Freq: Every day | SUBCUTANEOUS | Status: DC

## 2019-03-14 MED ORDER — INSULIN DETEMIR 100 UNIT/ML ~~LOC~~ SOLN
30.0000 [IU] | Freq: Every day | SUBCUTANEOUS | Status: DC
  Administered 2019-03-14 – 2019-03-16 (×3): 30 [IU] via SUBCUTANEOUS
  Filled 2019-03-14 (×3): qty 0.3

## 2019-03-14 MED ORDER — AMOXICILLIN-POT CLAVULANATE 875-125 MG PO TABS
1.0000 | ORAL_TABLET | Freq: Two times a day (BID) | ORAL | Status: DC
  Administered 2019-03-14 – 2019-03-16 (×5): 1 via ORAL
  Filled 2019-03-14 (×5): qty 1

## 2019-03-14 MED ORDER — METOCLOPRAMIDE HCL 5 MG PO TABS
5.0000 mg | ORAL_TABLET | Freq: Three times a day (TID) | ORAL | Status: DC
  Administered 2019-03-14 – 2019-03-16 (×7): 5 mg via ORAL
  Filled 2019-03-14 (×7): qty 1

## 2019-03-14 NOTE — Progress Notes (Addendum)
[facility]ITALIST PROGRESS NOTE  Deanna Landry [id number] DOB: Aug 18, 1971 DOA: 03/10/2019 PCP: Cher Nakai, MD   Narrative: 48 ? 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 Unilateral blindness right eye secondary to diabetic retinopathy Diabetic nephropathy-she does not know her baseline kidney function  Patient moved here from [place] last year where she was living with her daughter and grandchild to live with her mother who has multiple cardiac issues She lives in [place] negative x2  She has history of suicidality in the past but she also does have a history of schizophrenia and "hearing voices  She was hospitalized and treated for probable sepsis found to have likely aspiration pneumonia and antibiotics were adjusted she does have underlying OHSS based on her habitus-we needed to upper antibiotics and we are attempting to de-escalate and send her home (does not want SNF) in the next 2 to 3 days   A & Plan  Sepsis -aspiration pna CXR 2vw 4/26 new Multilobar PNA Initially ceftriaxone azithromycin-->Zosyn 2/2 fevers 4/26 attempting to narrow again to Augmentin 4/28 Probably does aspirate to some extent OHSS Habitus likely cause for oxygen needs at night especially Attempt to wean Outpatient CPAP to be arranged by case management ?  Suicidality ?  Overdose insulin Discussed with Dr. Darleene Cleaver psychiatry-patient is cleared form SI perspective and will follow as OP  Diabetes mellitus type 1 since age 48 Diabetic nephropathy Diabetic retinopathy-continue drops DM gastroparesis-Start reglan po 4/28 for Presumed gastroparesis 4.28 CBGs elevated--now improved on mod CBG coverage eating fair--Nausea persists On home dose levemir 30 Continue Lyrica 75 twice daily At home takes insulin 10 3 times daily Trulicity 1.5 weekly Sugars slight elevated--cont to adjust  AKI multi-factorial inc DM  nephropathy Hyperkalemia Discontinue ACE forever Labs imprved from admit Prior CVA?/MI Risk factors include diabetes hypertension obesity-continue Plavix 75 daily Hypothyroidism Continue Synthroid 50 mcg daily  TSH wnl OP repeat in 2-4 weeks BMI 34    DVT Lovenox code Status: Presumed full code  communication: mother called, updated 4/28 disposition Plan: Inpatient, not reayd for d/c--H64me in several days when stable   Deanna Polack, MD  [facility] Via [employer] app OR [email or web address] 7PM-7AM contact night coverage as above 03/14/2019, 2:56 PM  LOS: 4 days     Interval history/Subjective:  Anxious early in the day however now is doing fair Ate her whole lunch after negative esophagram however low-grade fevers 99 range throughout the day and on 4 L of oxygen   Objective:  Vitals:  Vitals:   03/14/19 0540 03/14/19 1325  BP:  (!) 145/58  Pulse:  67  Resp:  19  Temp:  99.2 F (37.3 C)  SpO2: 93% 98%    Exam:  EOMI NCAT S1-S2 no murmur rub or gallop Abdomen obese slightly tender in epigastrium Chest has no rales no rhonchi decreased air entry bilaterally posterolaterally No lower extremity edema Neurologically intact Flat affect   I have personally reviewed the following:  DATA   Labs: BUN/creatinine down from admission to 23/2.04  esophagram: 1. No stricture mass or obstruction of the esophagus. 2. No mucosal irregularity identified on limited exam. 3. Barium tablet passed GE junction easily    Scheduled Meds: . amoxicillin-clavulanate  1 tablet Oral Q12H  . aspirin EC  81 mg Oral Daily  . atorvastatin  80 mg Oral Daily  . atropine  1 drop Right Eye BID  . buPROPion  150 mg Oral Daily  .  clopidogrel  75 mg Oral Daily  . dorzolamide-timolol  1 drop Left Eye BID  . enoxaparin (LOVENOX) injection  40 mg Subcutaneous QHS  . insulin aspart  0-15 Units Subcutaneous TID WC  . insulin aspart  0-5 Units Subcutaneous QHS  . insulin aspart  4 Units  Subcutaneous TID WC  . insulin detemir  30 Units Subcutaneous Daily  . latanoprost  1 drop Both Eyes QPM  . levothyroxine  50 mcg Oral Q0600  . pantoprazole  40 mg Oral Daily  . polyethylene glycol  17 g Oral Daily  . prednisoLONE acetate  1 drop Left Eye BID  . pregabalin  75 mg Oral BID  . sertraline  100 mg Oral Daily   Continuous Infusions: . sodium chloride 10 mL/hr at 03/14/19 1320    Principal Problem:   Adjustment disorder with mixed anxiety and depressed mood Active Problems:   MDD (major depressive disorder), severe (HCC)   Fever   Sepsis ([place])   LOS: 4 days

## 2019-03-14 NOTE — Progress Notes (Signed)
Patient requested something for anxiety overnight. Pt states she is nervous about her swallow test. NP on call notified and order received for a one time dose of po Ativan. Will continue to monitor patient.

## 2019-03-15 DIAGNOSIS — R45851 Suicidal ideations: Secondary | ICD-10-CM

## 2019-03-15 DIAGNOSIS — N189 Chronic kidney disease, unspecified: Secondary | ICD-10-CM

## 2019-03-15 DIAGNOSIS — J189 Pneumonia, unspecified organism: Secondary | ICD-10-CM | POA: Diagnosis present

## 2019-03-15 DIAGNOSIS — E1022 Type 1 diabetes mellitus with diabetic chronic kidney disease: Secondary | ICD-10-CM

## 2019-03-15 DIAGNOSIS — N179 Acute kidney failure, unspecified: Secondary | ICD-10-CM

## 2019-03-15 DIAGNOSIS — F322 Major depressive disorder, single episode, severe without psychotic features: Secondary | ICD-10-CM

## 2019-03-15 DIAGNOSIS — E109 Type 1 diabetes mellitus without complications: Secondary | ICD-10-CM | POA: Diagnosis present

## 2019-03-15 DIAGNOSIS — J9601 Acute respiratory failure with hypoxia: Secondary | ICD-10-CM

## 2019-03-15 DIAGNOSIS — I129 Hypertensive chronic kidney disease with stage 1 through stage 4 chronic kidney disease, or unspecified chronic kidney disease: Secondary | ICD-10-CM

## 2019-03-15 HISTORY — DX: Suicidal ideations: R45.851

## 2019-03-15 HISTORY — DX: Type 1 diabetes mellitus without complications: E10.9

## 2019-03-15 HISTORY — DX: Acute respiratory failure with hypoxia: J96.01

## 2019-03-15 HISTORY — DX: Acute kidney failure, unspecified: N17.9

## 2019-03-15 HISTORY — DX: Acute kidney failure, unspecified: N18.9

## 2019-03-15 LAB — BASIC METABOLIC PANEL
Anion gap: 9 (ref 5–15)
BUN: 22 mg/dL — ABNORMAL HIGH (ref 6–20)
CO2: 25 mmol/L (ref 22–32)
Calcium: 8.6 mg/dL — ABNORMAL LOW (ref 8.9–10.3)
Chloride: 107 mmol/L (ref 98–111)
Creatinine, Ser: 1.9 mg/dL — ABNORMAL HIGH (ref 0.44–1.00)
GFR calc Af Amer: 36 mL/min — ABNORMAL LOW (ref >60)
GFR calc non Af Amer: 31 mL/min — ABNORMAL LOW (ref >60)
Glucose, Bld: 144 mg/dL — ABNORMAL HIGH (ref 70–99)
Potassium: 4.1 mmol/L (ref 3.5–5.1)
Sodium: 141 mmol/L (ref 135–145)

## 2019-03-15 LAB — CBC WITH DIFFERENTIAL/PLATELET
Abs Immature Granulocytes: 0.03 10*3/uL (ref 0.00–0.07)
Basophils Absolute: 0 10*3/uL (ref 0.0–0.1)
Basophils Relative: 0 %
Eosinophils Absolute: 0.1 10*3/uL (ref 0.0–0.5)
Eosinophils Relative: 3 %
HCT: 26.6 % — ABNORMAL LOW (ref 36.0–46.0)
Hemoglobin: 8.4 g/dL — ABNORMAL LOW (ref 12.0–15.0)
Immature Granulocytes: 1 %
Lymphocytes Relative: 19 %
Lymphs Abs: 0.6 10*3/uL — ABNORMAL LOW (ref 0.7–4.0)
MCH: 28.4 pg (ref 26.0–34.0)
MCHC: 31.6 g/dL (ref 30.0–36.0)
MCV: 89.9 fL (ref 80.0–100.0)
Monocytes Absolute: 0.2 10*3/uL (ref 0.1–1.0)
Monocytes Relative: 6 %
Neutro Abs: 2.3 10*3/uL (ref 1.7–7.7)
Neutrophils Relative %: 71 %
Platelets: 133 10*3/uL — ABNORMAL LOW (ref 150–400)
RBC: 2.96 MIL/uL — ABNORMAL LOW (ref 3.87–5.11)
RDW: 13.3 % (ref 11.5–15.5)
WBC: 3.3 10*3/uL — ABNORMAL LOW (ref 4.0–10.5)
nRBC: 0 % (ref 0.0–0.2)

## 2019-03-15 LAB — CULTURE, BLOOD (ROUTINE X 2)
Culture: NO GROWTH
Culture: NO GROWTH
Special Requests: ADEQUATE
Special Requests: ADEQUATE

## 2019-03-15 LAB — GLUCOSE, CAPILLARY
Glucose-Capillary: 192 mg/dL — ABNORMAL HIGH (ref 70–99)
Glucose-Capillary: 186 mg/dL — ABNORMAL HIGH (ref 70–99)
Glucose-Capillary: 155 mg/dL — ABNORMAL HIGH (ref 70–99)
Glucose-Capillary: 127 mg/dL — ABNORMAL HIGH (ref 70–99)

## 2019-03-15 MED ORDER — INSULIN ASPART 100 UNIT/ML ~~LOC~~ SOLN
8.0000 [IU] | Freq: Three times a day (TID) | SUBCUTANEOUS | Status: DC
  Administered 2019-03-15 – 2019-03-16 (×5): 8 [IU] via SUBCUTANEOUS

## 2019-03-15 NOTE — TOC Initial Note (Signed)
Transition of Care [facility]) - Initial/Assessment Note    Patient Details  Name: Deanna Landry MRN: [id number] Date of Birth: 1971/02/02  [facility] CM/SW Contact:    Wende Neighbors, LCSW Phone Number: 03/15/2019, 4:03 PM  Clinical Narrative:    CSW spoke with patient at bedside. Patient lives in a trailer with her elderly mother. Patient stated she is agreeable to have home health come into the home for support.     CSW reached out to [facility] and spoke to Santiago Glad, Santiago Glad stated she will see if they are abel to take patient tomorrow for home health needs. CSW also spoke to [place] from [facility] to assist patient with O2 needs. Zack stated they will look at patients chart to make sure they can provide O2 at discharge          Expected Discharge Plan: [place] Barriers to Discharge: Continued Medical Work up   Patient Goals and CMS Choice Patient states their goals for this hospitalization and ongoing recovery are:: wants to go home CMS Medicare.gov Compare Post Acute Care list provided to:: Patient Choice offered to / list presented to : Patient  Expected Discharge Plan and Services Expected Discharge Plan: [place] In-house Referral: Clinical Social Work   Post Acute Care Choice: [place] arrangements for the past 2 months: [place]                 DME Arranged: Oxygen DME Agency: [facility] Date DME Agency Contacted: 03/15/19 Time DME Agency Contacted: [phone] Representative spoke with at DME Agency: [place]: [place] Date [place]: 03/15/19 Time [place]: 1602 Representative spoke with at [place]: [place] Arrangements/Services Living arrangements for the past 2 months: [place] with:: Self, Parents Patient language and need for interpreter reviewed:: Yes Do you feel safe going back to the place where you live?: Yes       Need for Family Participation in Patient Care: Yes (Comment) Care giver support system in place?: Yes (comment)   Criminal Activity/Legal Involvement Pertinent to Current Situation/Hospitalization: No - Comment as needed  Activities of Daily Living Home Assistive Devices/Equipment: None ADL Screening (condition at time of admission) Patient's cognitive ability adequate to safely complete daily activities?: Yes Is the patient deaf or have difficulty hearing?: No Does the patient have difficulty seeing, even when wearing glasses/contacts?: No Does the patient have difficulty concentrating, remembering, or making decisions?: Yes Patient able to express need for assistance with ADLs?: Yes Does the patient have difficulty dressing or bathing?: Yes Independently performs ADLs?: Yes (appropriate for developmental age) Does the patient have difficulty walking or climbing stairs?: Yes Weakness of Legs: None Weakness of Arms/Hands: None  Permission Sought/Granted Permission sought to share information with : Family Supports Permission granted to share information with : Yes, Verbal Permission Granted  Share Information with NAME: Hannah Beat      Permission granted to share info w Relationship: mother  Permission granted to share info w Contact Information: [phone]  Emotional Assessment Appearance:: Appears stated age Attitude/Demeanor/Rapport: Engaged Affect (typically observed): Accepting Orientation: : Oriented to Self, Oriented to Place, Oriented to  Time, Oriented to Situation Alcohol / Substance Use: Not Applicable Psych Involvement: No (comment)  Admission diagnosis:  Sepsis [facility]) [A41.9] Patient Active Problem List   Diagnosis Date Noted  . Adjustment disorder with mixed anxiety and  depressed mood 03/11/2019  . Fever 03/10/2019  . Sepsis ([place]) 03/10/2019  . MDD (major depressive disorder), severe ([place]) 03/09/2019  . Major depression, recurrent ([place]) 03/09/2019   PCP:   Cher Nakai, MD Pharmacy:   [id number] - [place], [place] - [address] [place]  [id number] Phone: [phone] Fax: [phone]     Social Determinants of Health (SDOH) Interventions    Readmission Risk Interventions No flowsheet data found.

## 2019-03-15 NOTE — Evaluation (Signed)
Physical Therapy Evaluation Patient Details Name: Deanna Landry MRN: [id number] DOB: 1971-06-24 Today's Date: 03/15/2019   History of Present Illness  Pt is a 48 year old female admitted for sepsis likely due to aspiration pneumonia and OHSS with PMHx significant for stroke, MI, HTN, CHF, Unilateral blindness right eye secondary to diabetic retinopathy, DM type 1  Clinical Impression  Pt admitted with above diagnosis. Pt currently with functional limitations due to the deficits listed below (see PT Problem List). Pt will benefit from skilled PT to increase their independence and safety with mobility to allow discharge to the venue listed below.  Pt assisted with ambulating short distance in hallway.  Pt requiring supplemental oxygen for mobility at this time.   SATURATION QUALIFICATIONS: (This note is used to comply with regulatory documentation for home oxygen)  Patient Saturations on Room Air at Rest = 93%  Patient Saturations on Room Air while Ambulating = 83%  Patient Saturations on 3 Liters of oxygen while Ambulating = 95%  Please briefly explain why patient needs home oxygen: to improve oxygen saturations with physical activity such as ambulation.     Follow Up Recommendations Home health PT;Supervision for mobility/OOB    Equipment Recommendations  Rolling walker with 5" wheels(if pt agreeable)    Recommendations for Other Services       Precautions / Restrictions Precautions Precautions: Fall Precaution Comments: right eye blindness, typically uses "seeing eye" cane, monitor sats Restrictions Weight Bearing Restrictions: No      Mobility  Bed Mobility               General bed mobility comments: pt up in recliner on arrival  Transfers Overall transfer level: Needs assistance Equipment used: Rolling walker (2 wheeled) Transfers: Sit to/from Stand Sit to Stand: Min guard         General transfer comment: cues for hand  placement  Ambulation/Gait Ambulation/Gait assistance: Min guard Gait Distance (Feet): 80 Feet Assistive device: Rolling walker (2 wheeled) Gait Pattern/deviations: Step-through pattern;Decreased stride length     General Gait Details: pt mobilizing well with RW however reports frontal head pressure, blurry vision, dizziness, and nausea with increased mobility (has been occurring at home as well), applied 3L O2 [place] and symptoms slightly improved and SPO2 95%  Stairs            Wheelchair Mobility    Modified Rankin (Stroke Patients Only)       Balance Overall balance assessment: Needs assistance         Standing balance support: Bilateral upper extremity supported Standing balance-Leahy Scale: Poor Standing balance comment: able to stand statically without assistive device                             Pertinent Vitals/Pain Pain Assessment: No/denies pain    Home Living Family/patient expects to be discharged to:: Private residence Living Arrangements: Spouse/significant other   Type of Home: [place]: One level Home Equipment: Other (comment)("seeing eye" cane)      Prior Function Level of Independence: Independent with assistive device(s)               Hand Dominance        Extremity/Trunk Assessment        Lower Extremity Assessment Lower Extremity Assessment: Generalized weakness    Cervical / Trunk Assessment Cervical / Trunk Assessment: Normal  Communication   Communication: No difficulties  Cognition Arousal/Alertness: Awake/alert Behavior During Therapy: WFL for tasks assessed/performed Overall Cognitive Status: Within Functional Limits for tasks assessed                                        General Comments      Exercises     Assessment/Plan    PT Assessment Patient needs continued PT services  PT Problem List Decreased strength;Decreased mobility;Decreased activity  tolerance;Decreased balance;Decreased knowledge of use of DME;Cardiopulmonary status limiting activity       PT Treatment Interventions Gait training;Therapeutic activities;Stair training;Therapeutic exercise;Functional mobility training;Balance training;Patient/family education;DME instruction    PT Goals (Current goals can be found in the Care Plan section)  Acute Rehab PT Goals PT Goal Formulation: With patient Time For Goal Achievement: 03/29/19 Potential to Achieve Goals: Good    Frequency Min 3X/week   Barriers to discharge        Co-evaluation               AM-PAC PT "6 Clicks" Mobility  Outcome Measure Help needed turning from your back to your side while in a flat bed without using bedrails?: None Help needed moving from lying on your back to sitting on the side of a flat bed without using bedrails?: None Help needed moving to and from a bed to a chair (including a wheelchair)?: A Little Help needed standing up from a chair using your arms (e.g., wheelchair or bedside chair)?: A Little Help needed to walk in hospital room?: A Little Help needed climbing 3-5 steps with a railing? : A Little 6 Click Score: 20    End of Session Equipment Utilized During Treatment: Gait belt;Oxygen Activity Tolerance: Patient tolerated treatment well Patient left: in chair;with call bell/phone within reach   PT Visit Diagnosis: Other abnormalities of gait and mobility (R26.89)    Time: 4103-0131 PT Time Calculation (min) (ACUTE ONLY): 17 min   Charges:   PT Evaluation $PT Eval Low Complexity: [place], PT, DPT Acute Rehabilitation Services Office: [phone] Pager: [phone]  Trena Platt 03/15/2019, 11:53 AM

## 2019-03-15 NOTE — Progress Notes (Signed)
[facility] PROGRESS NOTE  Deanna Landry MVH:[id number] DOB: [birth year] DOA: 03/10/2019 PCP: Cher Nakai, MD  Assessment/Plan:  Acute hypoxic respiratory failure secondary to Multilobar pneumonia, improved. Previously on Ceftriaxone, Azithromycin and Zosyn on admission,(4/25) deescalated to augmentin on 4/28 and has remained afebrile.  Has normal breath sounds on lung fields physical exam will not repeat chest x-ray, will attempt to wean oxygen, currently requiring 3 L, encourage incentive spirometry.  AKI on CKD with hyperkalemia due to diabetic nephropathy, improving. Unclear baseline, monitor output.  Home ACE inhibitor discontinued this admission.  Suicidal Ideation, resolved. Psych evaluted ( 4/25) no evidence of imminent risk to self or others. Continue zoloft, added wellburin for depression during hospital stay, s/w consult to facilitate patient's referral to outpatient psychiatrist/therapist on discharge  T1DM with hypoglycemia (A1c 6.8%). Hypoglycemic episode initially due to reported suicide attempt with her insulin no hypoglycemic episodes in > 48 hours, improved appetite. Fasting BG> 180 on 4/29, continue meal coverage short acting insulin, detemir 30 qd, closely monitor CBGs  Hypothyroidism, stable continue Synthroid  Prior CVA/MI? Continue DAPT with aspirin and plavix  Diabetic retinopathy, stable.  Continue home eyedrops  Diabetic gastroparesis, presumed diagnosis during this admission.  Started on Reglan.  Monitor output  Code Status: Full code Family Communication: No family at bedside (indicate person spoken with, relationship, and if by phone, the number) Disposition Plan: Wean oxygen as able, case management providing home O2, encourage incentive spirometry   Consultants:  None  Procedures:  None  Antibiotics:  Azithromycin 4/25- 4/26  Zosyn 4/26-4/28  Augmentin 4/28-  HPI/Subjective:  Deanna Landry is a 48 y.o. year old female with medical history  significant for hypertension, diabetes mellitus, CHF, CAD status post MI, CKD, stroke  who presented on 03/10/2019 with  fever while at behavioral health for possible suicide attempt with her insulin and was found to have multilobar pneumonia, AKI, and hypotension  States improvement in her breathing but she did have oxygen desaturation with exertion while working with therapy Denies any chest pain, reports loose stool in the setting of using bowel regimen has declined any further MiraLAX use.  Objective: Vitals:   03/15/19 0516 03/15/19 0815  BP: (!) 149/63   Pulse: 68   Resp: 20   Temp: 99.3 F (37.4 C)   SpO2: 97% 94%    Intake/Output Summary (Last 24 hours) at 03/15/2019 0849 Last data filed at 03/15/2019 0433 Gross per 24 hour  Intake 856.73 ml  Output 600 ml  Net 256.73 ml   Filed Weights   03/11/19 0708  Weight: 84.7 kg    Exam:   General: Sitting in bedside chair, no distress  Cardiovascular: Regular rate and rhythm, no edema  Respiratory: Normal respiratory effort on 3 L, normal breath sounds throughout  Abdomen: Soft, nondistended, nontender  Musculoskeletal: Normal range of motion  Skin no rashes or lesions  Neurologic no appreciable focal deficits  Data Reviewed: Basic Metabolic Panel: Recent Labs  Lab 03/10/19 2346 03/11/19 0308 03/12/19 0423 03/14/19 0357 03/15/19 0422  NA 140 141 139 140 141  K 5.7* 5.6* 4.9 4.6 4.1  CL 115* 115* 111 108 107  CO2 22 22 22 23 25   GLUCOSE 234* 201* 191* 239* 144*  BUN 52* 53* 32* 23* 22*  CREATININE 2.29* 2.24* 1.85* 2.04* 1.90*  CALCIUM 7.9* 8.0* 8.4* 8.5* 8.6*  MG  --  2.3  --   --   --   PHOS  --  3.2  --  3.5  --  Liver Function Tests: Recent Labs  Lab 03/10/19 1148 03/10/19 2346 03/11/19 0308 03/12/19 0423 03/14/19 0357  AST 9* 11* 10* 12*  --   ALT 10 11 11 8   --   ALKPHOS 106 95 85 78  --   BILITOT 0.4 0.2* 0.5 0.4  --   PROT 6.9 6.1* 5.9* 6.3*  --   ALBUMIN 3.6 2.9* 3.0* 2.9* 2.8*    Recent Labs  Lab 03/10/19 1148  LIPASE 27   No results for input(s): AMMONIA in the last 168 hours. CBC: Recent Labs  Lab 03/10/19 1148 03/11/19 0308 03/12/19 0423 03/15/19 0422  WBC 6.9 4.4 5.0 3.3*  NEUTROABS 5.3 3.1 3.7 2.3  HGB 9.8* 8.3* 8.8* 8.4*  HCT 30.9* 27.3* 28.2* 26.6*  MCV 90.1 93.2 90.1 89.9  PLT 129* 98* 123* 133*   Cardiac Enzymes: Recent Labs  Lab 03/10/19 2346  TROPONINI <0.03   BNP (last 3 results) No results for input(s): BNP in the last 8760 hours.  ProBNP (last 3 results) No results for input(s): PROBNP in the last 8760 hours.  CBG: Recent Labs  Lab 03/14/19 0734 03/14/19 1307 03/14/19 1627 03/14/19 2056 03/15/19 0743  GLUCAP 285* 264* 333* 203* 192*    Recent Results (from the past 240 hour(s))  Urine culture     Status: Abnormal   Collection Time: 03/10/19  7:45 AM  Result Value Ref Range Status   Specimen Description   Final    URINE, CLEAN CATCH Performed at [facility], [place] [address], [place], [place] [address]    Special Requests   Final    NONE Performed at [facility], [place] [address] [place], [place] [address]    Culture MULTIPLE SPECIES PRESENT, SUGGEST RECOLLECTION (A)  Final   Report Status 03/11/2019 FINAL  Final  Blood culture (routine x 2)     Status: None (Preliminary result)   Collection Time: 03/10/19 11:48 AM  Result Value Ref Range Status   Specimen Description   Final    RIGHT ANTECUBITAL Performed at [place] [address], [place], [place] [address]    Special Requests   Final    BOTTLES DRAWN AEROBIC AND ANAEROBIC Blood Culture adequate volume Performed at [place] [address] [place], [place] [address]    Culture   Final    NO GROWTH 4 DAYS Performed at [facility], [place] [address] [place], [place] [address]    Report Status PENDING  Incomplete  Blood culture (routine x 2)     Status: None  (Preliminary result)   Collection Time: 03/10/19 11:49 AM  Result Value Ref Range Status   Specimen Description   Final    RIGHT ANTECUBITAL Performed at [place] [address], [place], [place] [address]    Special Requests   Final    BOTTLES DRAWN AEROBIC AND ANAEROBIC Blood Culture adequate volume Performed at [place] [address], [place], [place] [address]    Culture   Final    NO GROWTH 4 DAYS Performed at [facility], [place] [address], [place], [place] [address]    Report Status PENDING  Incomplete  SARS Coronavirus 2 [facility] order, Performed in [place] hospital lab)     Status: None   Collection Time: 03/10/19  3:10 PM  Result Value Ref Range Status   SARS Coronavirus 2 NEGATIVE NEGATIVE Final    Comment: (NOTE) If  result is NEGATIVE SARS-CoV-2 target nucleic acids are NOT DETECTED. The SARS-CoV-2 RNA is generally detectable in upper and lower  respiratory specimens during the acute phase of infection. The lowest  concentration of SARS-CoV-2 viral copies this assay can detect is 250  copies / mL. A negative result does not preclude SARS-CoV-2 infection  and should not be used as the sole basis for treatment or other  patient management decisions.  A negative result may occur with  improper specimen collection / handling, submission of specimen other  than nasopharyngeal swab, presence of viral mutation(s) within the  areas targeted by this assay, and inadequate number of viral copies  (<250 copies / mL). A negative result must be combined with clinical  observations, patient history, and epidemiological information. If result is POSITIVE SARS-CoV-2 target nucleic acids are DETECTED. The SARS-CoV-2 RNA is generally detectable in upper and lower  respiratory specimens dur ing the acute phase of infection.  Positive  results are indicative of active infection with SARS-CoV-2.  Clinical  correlation with  patient history and other diagnostic information is  necessary to determine patient infection status.  Positive results do  not rule out bacterial infection or co-infection with other viruses. If result is PRESUMPTIVE POSTIVE SARS-CoV-2 nucleic acids MAY BE PRESENT.   A presumptive positive result was obtained on the submitted specimen  and confirmed on repeat testing.  While 2019 novel coronavirus  (SARS-CoV-2) nucleic acids may be present in the submitted sample  additional confirmatory testing may be necessary for epidemiological  and / or clinical management purposes  to differentiate between  SARS-CoV-2 and other Sarbecovirus currently known to infect humans.  If clinically indicated additional testing with an alternate test  methodology [phone]) is advised. The SARS-CoV-2 RNA is generally  detectable in upper and lower respiratory sp ecimens during the acute  phase of infection. The expected result is Negative. Fact Sheet for Patients:  StrictlyIdeas.no Fact Sheet for Healthcare Providers: BankingDealers.co.za This test is not yet approved or cleared by the [place] FDA and has been authorized for detection and/or diagnosis of SARS-CoV-2 by FDA under an Emergency Use Authorization (EUA).  This EUA will remain in effect (meaning this test can be used) for the duration of the COVID-19 declaration under Section 564(b)(1) of the Act, 21 U.S.C. section 360bbb-3(b)(1), unless the authorization is terminated or revoked sooner. Performed at [facility], [place] [address], [place], [place] [address]   SARS Coronavirus 2 [facility] order, Performed in [facility] hospital lab)     Status: None   Collection Time: 03/10/19  9:28 PM  Result Value Ref Range Status   SARS Coronavirus 2 NEGATIVE NEGATIVE Final    Comment: (NOTE) If result is NEGATIVE SARS-CoV-2 target nucleic acids are NOT DETECTED. The SARS-CoV-2 RNA is  generally detectable in upper and lower  respiratory specimens during the acute phase of infection. The lowest  concentration of SARS-CoV-2 viral copies this assay can detect is 250  copies / mL. A negative result does not preclude SARS-CoV-2 infection  and should not be used as the sole basis for treatment or other  patient management decisions.  A negative result may occur with  improper specimen collection / handling, submission of specimen other  than nasopharyngeal swab, presence of viral mutation(s) within the  areas targeted by this assay, and inadequate number of viral copies  (<250 copies / mL). A negative result must be combined with clinical  observations, patient history, and epidemiological information. If result  is POSITIVE SARS-CoV-2 target nucleic acids are DETECTED. The SARS-CoV-2 RNA is generally detectable in upper and lower  respiratory specimens dur ing the acute phase of infection.  Positive  results are indicative of active infection with SARS-CoV-2.  Clinical  correlation with patient history and other diagnostic information is  necessary to determine patient infection status.  Positive results do  not rule out bacterial infection or co-infection with other viruses. If result is PRESUMPTIVE POSTIVE SARS-CoV-2 nucleic acids MAY BE PRESENT.   A presumptive positive result was obtained on the submitted specimen  and confirmed on repeat testing.  While 2019 novel coronavirus  (SARS-CoV-2) nucleic acids may be present in the submitted sample  additional confirmatory testing may be necessary for epidemiological  and / or clinical management purposes  to differentiate between  SARS-CoV-2 and other Sarbecovirus currently known to infect humans.  If clinically indicated additional testing with an alternate test  methodology [phone]) is advised. The SARS-CoV-2 RNA is generally  detectable in upper and lower respiratory sp ecimens during the acute  phase of  infection. The expected result is Negative. Fact Sheet for Patients:  StrictlyIdeas.no Fact Sheet for Healthcare Providers: BankingDealers.co.za This test is not yet approved or cleared by the [place] FDA and has been authorized for detection and/or diagnosis of SARS-CoV-2 by FDA under an Emergency Use Authorization (EUA).  This EUA will remain in effect (meaning this test can be used) for the duration of the COVID-19 declaration under Section 564(b)(1) of the Act, 21 U.S.C. section 360bbb-3(b)(1), unless the authorization is terminated or revoked sooner. Performed at [facility], [place] [address] [place], [place] [address]   Respiratory Panel by PCR     Status: None   Collection Time: 03/10/19  9:31 PM  Result Value Ref Range Status   Adenovirus NOT DETECTED NOT DETECTED Final   Coronavirus 229E NOT DETECTED NOT DETECTED Final    Comment: (NOTE) The Coronavirus on the Respiratory Panel, DOES NOT test for the novel  Coronavirus (2019 nCoV)    Coronavirus HKU1 NOT DETECTED NOT DETECTED Final   Coronavirus NL63 NOT DETECTED NOT DETECTED Final   Coronavirus OC43 NOT DETECTED NOT DETECTED Final   Metapneumovirus NOT DETECTED NOT DETECTED Final   Rhinovirus / Enterovirus NOT DETECTED NOT DETECTED Final   Influenza A NOT DETECTED NOT DETECTED Final   Influenza B NOT DETECTED NOT DETECTED Final   Parainfluenza Virus 1 NOT DETECTED NOT DETECTED Final   Parainfluenza Virus 2 NOT DETECTED NOT DETECTED Final   Parainfluenza Virus 3 NOT DETECTED NOT DETECTED Final   Parainfluenza Virus 4 NOT DETECTED NOT DETECTED Final   Respiratory Syncytial Virus NOT DETECTED NOT DETECTED Final   Bordetella pertussis NOT DETECTED NOT DETECTED Final   Chlamydophila pneumoniae NOT DETECTED NOT DETECTED Final   Mycoplasma pneumoniae NOT DETECTED NOT DETECTED Final    Comment: Performed at [facility], [place]. [address]  [place], [place] [address]  Culture, blood (Routine X 2) w Reflex to ID Panel     Status: None (Preliminary result)   Collection Time: 03/12/19  1:39 PM  Result Value Ref Range Status   Specimen Description   Final    BLOOD LEFT ARM Performed at [place] [address] [place], [place] [address]    Special Requests   Final    BOTTLES DRAWN AEROBIC ONLY Blood Culture adequate volume Performed at [place] [address] [place], [place] [address]  Culture   Final    NO GROWTH 2 DAYS Performed at [facility], [place] [address] [place], [place] [address]    Report Status PENDING  Incomplete  Culture, blood (Routine X 2) w Reflex to ID Panel     Status: None (Preliminary result)   Collection Time: 03/12/19  1:41 PM  Result Value Ref Range Status   Specimen Description   Final    BLOOD LEFT HAND Performed at [place] [address], [place], [place] [address]    Special Requests   Final    BOTTLES DRAWN AEROBIC ONLY Blood Culture adequate volume Performed at [place] [address] [place], [place] [address]    Culture   Final    NO GROWTH 2 DAYS Performed at [place] [address] [place], [place] [address]    Report Status PENDING  Incomplete  Culture, Urine     Status: None   Collection Time: 03/12/19  2:19 PM  Result Value Ref Range Status   Specimen Description   Final    URINE, CATHETERIZED Performed at [place] [address], [place], [place] [address]    Special Requests   Final    Rocephin, po Zithromax Performed at [place] [address], [place],  [address]    Culture   Final    NO GROWTH Performed at [facility], [place] [address], [place],  [address]    Report Status 03/13/2019 FINAL  Final     Studies: Dg Esophagus W Single Cm (sol Or Thin Ba)  Result Date: 03/14/2019 CLINICAL  DATA:  Mild discomfort with swallowing in the midesophagus. EXAM: ESOPHOGRAM/BARIUM SWALLOW TECHNIQUE: Single contrast examination was performed using thin barium. Exam is limited due to patient immobility FLUOROSCOPY TIME:  Fluoroscopy Time:  1 minutes 3seconds Radiation Exposure Index (if provided by the fluoroscopic device): 19.9 mGy Number of Acquired Spot Images: 5 COMPARISON:  None. FINDINGS: Exam limited due to patient immobility. Exam performed in semi-upright position (approximately 45 degrees). No esophageal stricture or mass. No esophageal mucosal abnormality identified. The GE junction is widely patent. A 13 mm barium tablet passed GE junction easily IMPRESSION: 1. No stricture mass or obstruction of the esophagus. 2. No mucosal irregularity identified on limited exam. 3. Barium tablet passed GE junction easily Electronically Signed   By: Suzy Bouchard M.D.   On: 03/14/2019 12:51    Scheduled Meds: . amoxicillin-clavulanate  1 tablet Oral Q12H  . aspirin EC  81 mg Oral Daily  . atorvastatin  80 mg Oral Daily  . atropine  1 drop Right Eye BID  . buPROPion  150 mg Oral Daily  . clopidogrel  75 mg Oral Daily  . dorzolamide-timolol  1 drop Left Eye BID  . enoxaparin (LOVENOX) injection  40 mg Subcutaneous QHS  . insulin aspart  0-15 Units Subcutaneous TID WC  . insulin aspart  0-5 Units Subcutaneous QHS  . insulin aspart  4 Units Subcutaneous TID WC  . insulin detemir  30 Units Subcutaneous Daily  . latanoprost  1 drop Both Eyes QPM  . levothyroxine  50 mcg Oral Q0600  . metoCLOPramide  5 mg Oral TID AC  . pantoprazole  40 mg Oral Daily  . polyethylene glycol  17 g Oral Daily  . prednisoLONE acetate  1 drop Left Eye BID  . pregabalin  75 mg Oral BID  . sertraline  100  mg Oral Daily   Continuous Infusions: . sodium chloride Stopped (03/14/19 1325)    Principal Problem:   Adjustment disorder with mixed anxiety and depressed mood Active Problems:   MDD (major depressive  disorder), severe ([place])   Fever   Sepsis ([place])      Desiree Hane  [facility]

## 2019-03-15 NOTE — Care Management Important Message (Signed)
Important Message  Patient Details IM Letter given to Rhea Pink SW to present to the Patient Name: Deanna Landry MRN: [id number] Date of Birth: 12-Jan-1971   Medicare Important Message Given:  Yes    Kerin Salen 03/15/2019, 11:03 AM

## 2019-03-15 NOTE — Progress Notes (Signed)
Inpatient Diabetes Program Recommendations  AACE/ADA: New Consensus Statement on Inpatient Glycemic Control (2015)  Target Ranges:  Prepandial:   less than 140 mg/dL      Peak postprandial:   less than 180 mg/dL (1-2 hours)      Critically ill patients:  140 - 180 mg/dL   Lab Results  Component Value Date   GLUCAP 192 (H) 03/15/2019   HGBA1C 6.8 (H) 03/10/2019    Review of Glycemic Control Results for Deanna Landry, Deanna Landry (MRN [id number]) as of 03/15/2019 08:27  Ref. Range 03/14/2019 07:34 03/14/2019 13:07 03/14/2019 16:27 03/14/2019 20:56 03/15/2019 07:43  Glucose-Capillary Latest Ref Range: 70 - 99 mg/dL 285 (H) 264 (H) 333 (H) 203 (H) 192 (H)   Diabetes history: DM  Outpatient Diabetes medications: Levemir 30 units Daily, Novolog 10 units tid, Trulicity 1.5 mg Q Monday Current orders for Inpatient glycemic control: Levemir 30 units Daily, Novolog 0-15 units tid, Novolog 0-5 units qhs, Novolog 4 units tid meal coverage  Inpatient Diabetes Program Recommendations:   Patient is now eating better so postprandial CBGs increased. -Increase Novolog meal coverage to 8 units tid if eats 50%.  Thank you, [name]. Karys Meckley, RN, MSN, CDE  Diabetes Coordinator Inpatient Glycemic Control Team Team Pager [phone] (8am-5pm) 03/15/2019 8:27 AM

## 2019-03-15 NOTE — Progress Notes (Signed)
Speech Language Pathology Discharge Patient Details Name: Deanna Landry MRN: [id number] DOB: 08-12-1971 Today's Date: 03/15/2019 Time:  -     Patient discharged from SLP services secondary to esophagram negative. And MD increased her Reglan to aid gastroparesis on 03/14/2019.  Please see latest therapy progress note for current level of functioning and progress toward goals.    Progress and discharge plan discussed with patient and/or caregiver: esophagram negative  GO     Macario Golds 03/15/2019, 12:57 PM  Luanna Salk, [facility] SLP Acute Rehab Services Pager [phone] Office [phone]

## 2019-03-16 DIAGNOSIS — R45851 Suicidal ideations: Secondary | ICD-10-CM

## 2019-03-16 LAB — GLUCOSE, CAPILLARY
Glucose-Capillary: 147 mg/dL — ABNORMAL HIGH (ref 70–99)
Glucose-Capillary: 305 mg/dL — ABNORMAL HIGH (ref 70–99)

## 2019-03-16 MED ORDER — METOCLOPRAMIDE HCL 5 MG PO TABS: 5.0000 mg | ORAL_TABLET | Freq: Three times a day (TID) | ORAL | 0 refills | Status: DC

## 2019-03-16 MED ORDER — ZINC OXIDE 40 % EX OINT: TOPICAL_OINTMENT | CUTANEOUS | 0 refills | Status: DC | PRN

## 2019-03-16 MED ORDER — HYDRALAZINE HCL 50 MG PO TABS: ORAL_TABLET | ORAL | Status: DC

## 2019-03-16 MED ORDER — AMOXICILLIN-POT CLAVULANATE 875-125 MG PO TABS: 1.0000 | ORAL_TABLET | Freq: Two times a day (BID) | ORAL | 0 refills | Status: AC

## 2019-03-16 MED ORDER — ZINC OXIDE 40 % EX OINT: TOPICAL_OINTMENT | CUTANEOUS | Status: DC | PRN

## 2019-03-16 MED ORDER — FUROSEMIDE 20 MG PO TABS: ORAL_TABLET | ORAL | Status: DC

## 2019-03-16 MED ORDER — ONDANSETRON HCL 4 MG PO TABS: 4.0000 mg | ORAL_TABLET | Freq: Four times a day (QID) | ORAL | 0 refills | Status: DC | PRN

## 2019-03-16 NOTE — Discharge Summary (Signed)
Discharge Summary  Deanna Landry STM:[id number] DOB: 10-Mar-1971  PCP: Cher Nakai, MD  Admit date: 03/10/2019 Discharge date: 03/16/2019   Time spent: < 25 minutes  Admitted From: Home Disposition:  Home  Recommendations for Outpatient Follow-up:  1. Follow up with PCP in 1 week 2. Needs BP check with PCP, recommended to hold hydralazine, Lasix and lisinopril (will need BMP check for potassium and creatinine as well) 3. Discharged with 3 L oxygen    Discharge Diagnoses:  Active Hospital Problems   Diagnosis Date Noted   Adjustment disorder with mixed anxiety and depressed mood 03/11/2019   Pneumonia 03/15/2019   Type 1 DM with CKD and hypertension ([place]) 03/15/2019   Acute kidney injury superimposed on CKD ([place]) 03/15/2019   Suicidal ideation 03/15/2019   Acute respiratory failure with hypoxia ([place]) 03/15/2019   Sepsis ([place]) 03/10/2019   Fever 03/10/2019   MDD (major depressive disorder), severe ([place]) 03/09/2019    Resolved Hospital Problems  No resolved problems to display.    Discharge Condition: Stable  CODE STATUS: Full code  History of present illness:  Deanna Landry is a 48 y.o. year old female with medical history significant for hypertension, diabetes mellitus, CHF, CAD status post MI, CKD, stroke  who presented on 03/10/2019 with fever while at behavioral health for possible suicide attempt with her insulin and was found to have multilobar pneumonia, AKI, and hypotension.  Remaining hospital course addressed in problem based format below:   Hospital Course:   Acute hypoxic respiratory failure secondary to Multilobar pneumonia, improved.  Empirically on Ceftriaxone, Azithromycin and Zosyn on admission,(4/25) deescalated to augmentin on 4/28 continue to improve clinically.  COVID testing negative. Continue to complete 5 days total therapy., Requiring 3 L to maintain oxygen saturation greater than 88% on ambulatory oxygen test, will DC with O2.  Hypotension,  resolved On admission found to have SBP in 80s in ED. Her home amlodipine Lasix, isordil, hydralazine and lisinopril were held throughout admission.  On discharge she can resume her home amlodipine, isosorbide dinitrate given significant improvement in blood pressure, SBP's in 150s on discharge.  Recommend holding lisinopril given hyperkalemia, Lasix and hydralazine until PCP follow-up.  AKI on CKD with hyperkalemia due to diabetic nephropathy, improving. Unclear baseline, peak creatinine of 2.95, on discharge creatinine range 1.8-1.9 with GFR of 37.  Home ACE inhibitor discontinued this admission.  Suicidal Ideation, resolved.   Initially admitted due to concern for potential suicidal ideation with a plan to harm herself with insulin overdose.  She was initially admitted at behavioral health.  She was transferred to [facility] long due to fever and concern for infection.  During hospital stay she was evaluated by psychiatry who deemed she had no evidence of imminent risk to self or others.  On day of discharge she again reported no suicidal ideation or plan.  She plans to follow-up with [facility] clinic for outpatient psychiatry/therapy on discharge.  Continue zoloft, added wellburin for depression during hospital stay.  T1DM with hypoglycemia (A1c 6.8%). Hypoglycemic episode initially due to reported suicide attempt with her insulin.  Her blood glucose improved to the point that she was able to resume her home insulin regimen.    Hypothyroidism, stabl.e continue Synthroid  Prior CVA/MI? Continue DAPT with aspirin and plavix  Diabetic retinopathy, stable.  Continue home eyedrops  Diabetic gastroparesis, presumed diagnosis during this admission.  Started on Reglan here and will continue on discharge   Consultations:  Psychiatry  Procedures/Studies: none  Discharge Exam: BP Marland Kitchen)  146/62 (BP Location: Left Arm)    Pulse 68    Temp 98.8 F (37.1 C) (Oral)    Resp 18    Ht 5\' 2"  (1.575 m)    Wt  84.7 kg    LMP  (LMP Unknown) Comment: hysterectomy 2014   SpO2 100%    BMI 34.15 kg/m   General: Lying in bed, no apparent distress Eyes: EOMI, anicteric ENT: Oral Mucosa clear and moist Cardiovascular: regular rate and rhythm, no murmurs, rubs or gallops, no edema, Respiratory: Normal respiratory effort on 3 L [place], lungs clear to auscultation bilaterally Abdomen: soft, non-distended, non-tender, normal bowel sounds Skin: No Rash Neurologic: R eye blind, Grossly no focal neuro deficit.Mental status AAOx3, speech normal, Psychiatric:Appropriate affect, and mood, no suicidal or homicidal ideation   Discharge Instructions You were cared for by a hospitalist during your hospital stay. If you have any questions about your discharge medications or the care you received while you were in the hospital after you are discharged, you can call the unit and asked to speak with the hospitalist on call if the hospitalist that took care of you is not available. Once you are discharged, your primary care physician will handle any further medical issues. Please note that NO REFILLS for any discharge medications will be authorized once you are discharged, as it is imperative that you return to your primary care physician (or establish a relationship with a primary care physician if you do not have one) for your aftercare needs so that they can reassess your need for medications and monitor your lab values.  Discharge Instructions    Diet - low sodium heart healthy   Complete by:  As directed    Diet - low sodium heart healthy   Complete by:  As directed    Increase activity slowly   Complete by:  As directed    Increase activity slowly   Complete by:  As directed      Allergies as of 03/16/2019   No Known Allergies     Medication List    STOP taking these medications   lisinopril 20 MG tablet Commonly known as:  ZESTRIL     TAKE these medications   amLODipine 10 MG tablet Commonly known as:   NORVASC Take 10 mg by mouth daily.   amoxicillin-clavulanate 875-125 MG tablet Commonly known as:  AUGMENTIN Take 1 tablet by mouth every 12 (twelve) hours for 1 day.   aspirin EC 81 MG tablet Take 81 mg by mouth daily.   atorvastatin 80 MG tablet Commonly known as:  LIPITOR Take 80 mg by mouth daily.   atropine 1 % ophthalmic solution Place 1 drop into the right eye 2 (two) times daily.   buPROPion 150 MG 24 hr tablet Commonly known as:  WELLBUTRIN XL Take 150 mg by mouth daily.   clopidogrel 75 MG tablet Commonly known as:  PLAVIX Take 75 mg by mouth daily.   dorzolamide-timolol 22.3-6.8 MG/ML ophthalmic solution Commonly known as:  COSOPT Place 1 drop into the left eye 2 (two) times daily.   furosemide 20 MG tablet Commonly known as:  LASIX HOLD until seen by PCP What changed:    how much to take  how to take this  when to take this  additional instructions   hydrALAZINE 50 MG tablet Commonly known as:  APRESOLINE HOLD until seen by PCP What changed:    how much to take  how to take this  when to take  this  additional instructions   isosorbide dinitrate 20 MG tablet Commonly known as:  ISORDIL Take 20 mg by mouth 2 (two) times daily.   latanoprost 0.005 % ophthalmic solution Commonly known as:  XALATAN Place 1 drop into both eyes every evening.   Levemir 100 UNIT/ML injection Generic drug:  insulin detemir Inject 30 Units into the skin daily. QAM   levothyroxine 50 MCG tablet Commonly known as:  SYNTHROID Take 50 mcg by mouth daily.   liver oil-zinc oxide 40 % ointment Commonly known as:  DESITIN Apply topically as needed for irritation.   metoCLOPramide 5 MG tablet Commonly known as:  REGLAN Take 1 tablet (5 mg total) by mouth 3 (three) times daily before meals.   NovoLOG FlexPen 100 UNIT/ML FlexPen Generic drug:  insulin aspart Inject 10 Units into the skin 3 (three) times daily.   ondansetron 4 MG tablet Commonly known as:   ZOFRAN Take 1 tablet (4 mg total) by mouth every 6 (six) hours as needed for nausea.   pantoprazole 40 MG tablet Commonly known as:  PROTONIX Take 40 mg by mouth daily.   prednisoLONE acetate 1 % ophthalmic suspension Commonly known as:  PRED FORTE Place 1 drop into the left eye 2 (two) times daily.   pregabalin 75 MG capsule Commonly known as:  LYRICA Take 75 mg by mouth 2 (two) times daily.   sertraline 100 MG tablet Commonly known as:  ZOLOFT Take 100 mg by mouth daily.   Trulicity 1.5 HC/6.2BJ Sopn Generic drug:  Dulaglutide Inject 1.5 mg into the skin once a week. On Mondays            Durable Medical Equipment  (From admission, onward)         Start     Ordered   03/15/19 1636  For home use only DME oxygen  Once    Question Answer Comment  Mode or (Route) Nasal cannula   Liters per Minute 3   Frequency Continuous (stationary and portable oxygen unit needed)   Oxygen delivery system Gas      03/15/19 1635   03/15/19 1635  For home use only DME Walker rolling  Once    Question:  Patient needs a walker to treat with the following condition  Answer:  Decreased strength   03/15/19 1635   03/15/19 1517  For home use only DME oxygen  Once    Question Answer Comment  Mode or (Route) Nasal cannula   Liters per Minute 3   Frequency Continuous (stationary and portable oxygen unit needed)   Oxygen delivery system Gas      03/15/19 1517         No Known Allergies Follow-up Information    [facility] Follow up.   Why:  Please call for appointment or call your insurance company for additional [facility] in network. Contact information: [place] [phone] [place] Follow up.   Specialty:  Professional Counselor Why:  this is another [facility] information: [facility] [place] [address] [phone]            The  results of significant diagnostics from this hospitalization (including imaging, microbiology, ancillary and laboratory) are listed below for reference.    Significant Diagnostic Studies: Dg Chest 2 View  Result Date: 03/12/2019 CLINICAL DATA:  48 year old female with fever to 103 degrees overnight.  EXAM: CHEST - 2 VIEW COMPARISON:  Chest x-ray 03/11/2019. FINDINGS: Patchy multifocal interstitial and airspace disease noted throughout the lungs bilaterally, similar to yesterday's examination, and new compared to recent prior study from 03/10/2019. No pleural effusions. No cephalization of the pulmonary vasculature. Heart size is normal. Upper mediastinal contours are within normal limits. IMPRESSION: 1. The appearance of the chest is most compatible with multilobar bilateral pneumonia. Pulmonary edema is unlikely given the normal cardiac size, although similar findings could be seen in the setting of noncardiogenic pulmonary edema. Electronically Signed   By: Vinnie Langton M.D.   On: 03/12/2019 14:21   Dg Chest 2 View  Result Date: 03/11/2019 CLINICAL DATA:  Diabetic.  Short of breath. EXAM: CHEST - 2 VIEW COMPARISON:  03/10/2019 FINDINGS: Low lung volumes. Borderline cardiomegaly. Heterogeneous opacities at both lung bases left greater than right. Upper lungs clear. Normal vascularity. No pneumothorax or pleural effusion. Slight levoscoliosis at the thoracolumbar junction. IMPRESSION: New bibasilar opacities left greater than right. This is nonspecific and may represent edema or bronchopneumonia. Correlate clinically. Followup PA and lateral chest X-ray is recommended in 3-4 weeks following trial of antibiotic therapy to ensure resolution and exclude underlying malignancy. Electronically Signed   By: Marybelle Killings M.D.   On: 03/11/2019 12:22   Dg Chest Port 1 View  Result Date: 03/10/2019 CLINICAL DATA:  Fever EXAM: PORTABLE CHEST 1 VIEW COMPARISON:  None. FINDINGS: Lungs are clear. Heart size and  pulmonary vascularity are within normal limits. No adenopathy. No bone lesions. IMPRESSION: No edema or consolidation. Electronically Signed   By: Lowella Grip III M.D.   On: 03/10/2019 11:34   Dg Esophagus W Single Cm (sol Or Thin Ba)  Result Date: 03/14/2019 CLINICAL DATA:  Mild discomfort with swallowing in the midesophagus. EXAM: ESOPHOGRAM/BARIUM SWALLOW TECHNIQUE: Single contrast examination was performed using thin barium. Exam is limited due to patient immobility FLUOROSCOPY TIME:  Fluoroscopy Time:  1 minutes 3seconds Radiation Exposure Index (if provided by the fluoroscopic device): 19.9 mGy Number of Acquired Spot Images: 5 COMPARISON:  None. FINDINGS: Exam limited due to patient immobility. Exam performed in semi-upright position (approximately 45 degrees). No esophageal stricture or mass. No esophageal mucosal abnormality identified. The GE junction is widely patent. A 13 mm barium tablet passed GE junction easily IMPRESSION: 1. No stricture mass or obstruction of the esophagus. 2. No mucosal irregularity identified on limited exam. 3. Barium tablet passed GE junction easily Electronically Signed   By: Suzy Bouchard M.D.   On: 03/14/2019 12:51    Microbiology: Recent Results (from the past 240 hour(s))  Urine culture     Status: Abnormal   Collection Time: 03/10/19  7:45 AM  Result Value Ref Range Status   Specimen Description   Final    URINE, CLEAN CATCH Performed at [facility], [place] [address], [place], [place] [address]    Special Requests   Final    NONE Performed at [facility], [place] [address] [place], [place] [address]    Culture MULTIPLE SPECIES PRESENT, SUGGEST RECOLLECTION (A)  Final   Report Status 03/11/2019 FINAL  Final  Blood culture (routine x 2)     Status: None   Collection Time: 03/10/19 11:48 AM  Result Value Ref Range Status   Specimen Description   Final    RIGHT ANTECUBITAL Performed at [facility] [address], [place], [place] [address]    Special Requests   Final  BOTTLES DRAWN AEROBIC AND ANAEROBIC Blood Culture adequate volume Performed at [facility] [address] [place], [place] [address]    Culture   Final    NO GROWTH 5 DAYS Performed at [facility], [place] [address], [place], [place] [address]    Report Status 03/15/2019 FINAL  Final  Blood culture (routine x 2)     Status: None   Collection Time: 03/10/19 11:49 AM  Result Value Ref Range Status   Specimen Description   Final    RIGHT ANTECUBITAL Performed at [facility] [address], [place], [place] [address]    Special Requests   Final    BOTTLES DRAWN AEROBIC AND ANAEROBIC Blood Culture adequate volume Performed at [facility] [address], [place], [place] [address]    Culture   Final    NO GROWTH 5 DAYS Performed at [facility], [place] [address] [place], [place] [address]    Report Status 03/15/2019 FINAL  Final  SARS Coronavirus 2 [facility] order, Performed in [facility] hospital lab)     Status: None   Collection Time: 03/10/19  3:10 PM  Result Value Ref Range Status   SARS Coronavirus 2 NEGATIVE NEGATIVE Final    Comment: (NOTE) If result is NEGATIVE SARS-CoV-2 target nucleic acids are NOT DETECTED. The SARS-CoV-2 RNA is generally detectable in upper and lower  respiratory specimens during the acute phase of infection. The lowest  concentration of SARS-CoV-2 viral copies this assay can detect is 250  copies / mL. A negative result does not preclude SARS-CoV-2 infection  and should not be used as the sole basis for treatment or other  patient management decisions.  A negative result may occur with  improper specimen collection / handling, submission of specimen other  than nasopharyngeal swab, presence of viral mutation(s) within the  areas targeted by this assay, and inadequate number of viral  copies  (<250 copies / mL). A negative result must be combined with clinical  observations, patient history, and epidemiological information. If result is POSITIVE SARS-CoV-2 target nucleic acids are DETECTED. The SARS-CoV-2 RNA is generally detectable in upper and lower  respiratory specimens dur ing the acute phase of infection.  Positive  results are indicative of active infection with SARS-CoV-2.  Clinical  correlation with patient history and other diagnostic information is  necessary to determine patient infection status.  Positive results do  not rule out bacterial infection or co-infection with other viruses. If result is PRESUMPTIVE POSTIVE SARS-CoV-2 nucleic acids MAY BE PRESENT.   A presumptive positive result was obtained on the submitted specimen  and confirmed on repeat testing.  While 2019 novel coronavirus  (SARS-CoV-2) nucleic acids may be present in the submitted sample  additional confirmatory testing may be necessary for epidemiological  and / or clinical management purposes  to differentiate between  SARS-CoV-2 and other Sarbecovirus currently known to infect humans.  If clinically indicated additional testing with an alternate test  methodology [phone]) is advised. The SARS-CoV-2 RNA is generally  detectable in upper and lower respiratory sp ecimens during the acute  phase of infection. The expected result is Negative. Fact Sheet for Patients:  StrictlyIdeas.no Fact Sheet for Healthcare Providers: BankingDealers.co.za This test is not yet approved or cleared by the [place] FDA and has been authorized for detection and/or diagnosis of SARS-CoV-2 by FDA under an Emergency Use Authorization (EUA).  This EUA will remain in effect (meaning this test  can be used) for the duration of the COVID-19 declaration under Section 564(b)(1) of the Act, 21 U.S.C. section 360bbb-3(b)(1), unless the authorization is terminated  or revoked sooner. Performed at [facility], [place] [address] [place], [place] [address]   SARS Coronavirus 2 [facility] order, Performed in [facility] hospital lab)     Status: None   Collection Time: 03/10/19  9:28 PM  Result Value Ref Range Status   SARS Coronavirus 2 NEGATIVE NEGATIVE Final    Comment: (NOTE) If result is NEGATIVE SARS-CoV-2 target nucleic acids are NOT DETECTED. The SARS-CoV-2 RNA is generally detectable in upper and lower  respiratory specimens during the acute phase of infection. The lowest  concentration of SARS-CoV-2 viral copies this assay can detect is 250  copies / mL. A negative result does not preclude SARS-CoV-2 infection  and should not be used as the sole basis for treatment or other  patient management decisions.  A negative result may occur with  improper specimen collection / handling, submission of specimen other  than nasopharyngeal swab, presence of viral mutation(s) within the  areas targeted by this assay, and inadequate number of viral copies  (<250 copies / mL). A negative result must be combined with clinical  observations, patient history, and epidemiological information. If result is POSITIVE SARS-CoV-2 target nucleic acids are DETECTED. The SARS-CoV-2 RNA is generally detectable in upper and lower  respiratory specimens dur ing the acute phase of infection.  Positive  results are indicative of active infection with SARS-CoV-2.  Clinical  correlation with patient history and other diagnostic information is  necessary to determine patient infection status.  Positive results do  not rule out bacterial infection or co-infection with other viruses. If result is PRESUMPTIVE POSTIVE SARS-CoV-2 nucleic acids MAY BE PRESENT.   A presumptive positive result was obtained on the submitted specimen  and confirmed on repeat testing.  While 2019 novel coronavirus  (SARS-CoV-2) nucleic acids may be present in the submitted sample    additional confirmatory testing may be necessary for epidemiological  and / or clinical management purposes  to differentiate between  SARS-CoV-2 and other Sarbecovirus currently known to infect humans.  If clinically indicated additional testing with an alternate test  methodology [phone]) is advised. The SARS-CoV-2 RNA is generally  detectable in upper and lower respiratory sp ecimens during the acute  phase of infection. The expected result is Negative. Fact Sheet for Patients:  StrictlyIdeas.no Fact Sheet for Healthcare Providers: BankingDealers.co.za This test is not yet approved or cleared by the [place] FDA and has been authorized for detection and/or diagnosis of SARS-CoV-2 by FDA under an Emergency Use Authorization (EUA).  This EUA will remain in effect (meaning this test can be used) for the duration of the COVID-19 declaration under Section 564(b)(1) of the Act, 21 U.S.C. section 360bbb-3(b)(1), unless the authorization is terminated or revoked sooner. Performed at [facility], [place] [address], [place], [place] [address]   Respiratory Panel by PCR     Status: None   Collection Time: 03/10/19  9:31 PM  Result Value Ref Range Status   Adenovirus NOT DETECTED NOT DETECTED Final   Coronavirus 229E NOT DETECTED NOT DETECTED Final    Comment: (NOTE) The Coronavirus on the Respiratory Panel, DOES NOT test for the novel  Coronavirus (2019 nCoV)    Coronavirus HKU1 NOT DETECTED NOT DETECTED Final   Coronavirus NL63 NOT DETECTED NOT DETECTED Final   Coronavirus OC43 NOT DETECTED NOT DETECTED Final  Metapneumovirus NOT DETECTED NOT DETECTED Final   Rhinovirus / Enterovirus NOT DETECTED NOT DETECTED Final   Influenza A NOT DETECTED NOT DETECTED Final   Influenza B NOT DETECTED NOT DETECTED Final   Parainfluenza Virus 1 NOT DETECTED NOT DETECTED Final   Parainfluenza Virus 2 NOT DETECTED NOT DETECTED  Final   Parainfluenza Virus 3 NOT DETECTED NOT DETECTED Final   Parainfluenza Virus 4 NOT DETECTED NOT DETECTED Final   Respiratory Syncytial Virus NOT DETECTED NOT DETECTED Final   Bordetella pertussis NOT DETECTED NOT DETECTED Final   Chlamydophila pneumoniae NOT DETECTED NOT DETECTED Final   Mycoplasma pneumoniae NOT DETECTED NOT DETECTED Final    Comment: Performed at [facility], [place] [address], [place], [place] [address]  Culture, blood (Routine X 2) w Reflex to ID Panel     Status: None (Preliminary result)   Collection Time: 03/12/19  1:39 PM  Result Value Ref Range Status   Specimen Description   Final    BLOOD LEFT ARM Performed at [facility] [address], [place], [place] [address]    Special Requests   Final    BOTTLES DRAWN AEROBIC ONLY Blood Culture adequate volume Performed at [facility] [address], [place], [place] [address]    Culture   Final    NO GROWTH 4 DAYS Performed at [facility], [place] [address] [place], [place] [address]    Report Status PENDING  Incomplete  Culture, blood (Routine X 2) w Reflex to ID Panel     Status: None (Preliminary result)   Collection Time: 03/12/19  1:41 PM  Result Value Ref Range Status   Specimen Description   Final    BLOOD LEFT HAND Performed at [facility] [address] [place], [place] [address]    Special Requests   Final    BOTTLES DRAWN AEROBIC ONLY Blood Culture adequate volume Performed at [facility] [address] [place], [place] [address]    Culture   Final    NO GROWTH 4 DAYS Performed at [facility], [place] [address], [place], [place] [address]    Report Status PENDING  Incomplete  Culture, Urine     Status: None   Collection Time: 03/12/19  2:19 PM  Result Value Ref Range Status   Specimen Description   Final    URINE, CATHETERIZED Performed at [facility] [address] [place], [place] [address]    Special Requests   Final    Rocephin, po Zithromax Performed at [facility] [address] [place], [place] [address]    Culture   Final    NO GROWTH Performed at [facility], [place] [address], [place], [place] [address]    Report Status 03/13/2019 FINAL  Final     Labs: Basic Metabolic Panel: Recent Labs  Lab 03/10/19 2346 03/11/19 0308 03/12/19 0423 03/14/19 0357 03/15/19 0422  NA 140 141 139 140 141  K 5.7* 5.6* 4.9 4.6 4.1  CL 115* 115* 111 108 107  CO2 22 22 22 23 25   GLUCOSE 234* 201* 191* 239* 144*  BUN 52* 53* 32* 23* 22*  CREATININE 2.29* 2.24* 1.85* 2.04* 1.90*  CALCIUM 7.9* 8.0* 8.4* 8.5* 8.6*  MG  --  2.3  --   --   --   PHOS  --  3.2  --  3.5  --    Liver  Function Tests: Recent Labs  Lab 03/10/19 1148 03/10/19 2346 03/11/19 0308 03/12/19 0423 03/14/19 0357  AST 9* 11* 10* 12*  --   ALT 10 11 11 8   --   ALKPHOS 106 95 85 78  --   BILITOT 0.4 0.2* 0.5 0.4  --   PROT 6.9 6.1* 5.9* 6.3*  --   ALBUMIN 3.6 2.9* 3.0* 2.9* 2.8*   Recent Labs  Lab 03/10/19 1148  LIPASE 27   No results for input(s): AMMONIA in the last 168 hours. CBC: Recent Labs  Lab 03/10/19 1148 03/11/19 0308 03/12/19 0423 03/15/19 0422  WBC 6.9 4.4 5.0 3.3*  NEUTROABS 5.3 3.1 3.7 2.3  HGB 9.8* 8.3* 8.8* 8.4*  HCT 30.9* 27.3* 28.2* 26.6*  MCV 90.1 93.2 90.1 89.9  PLT 129* 98* 123* 133*   Cardiac Enzymes: Recent Labs  Lab 03/10/19 2346  TROPONINI <0.03   BNP: BNP (last 3 results) No results for input(s): BNP in the last 8760 hours.  ProBNP (last 3 results) No results for input(s): PROBNP in the last 8760 hours.  CBG: Recent Labs  Lab 03/15/19 1137 03/15/19 1617 03/15/19 1932 03/16/19 0726 03/16/19 1112  GLUCAP 186* 155* 127* 147* 305*       Signed:  Desiree Hane, MD [facility] 03/16/2019, 5:13 PM

## 2019-03-16 NOTE — Progress Notes (Signed)
Inpatient Diabetes Program Recommendations  AACE/ADA: New Consensus Statement on Inpatient Glycemic Control (2015)  Target Ranges:  Prepandial:   less than 140 mg/dL      Peak postprandial:   less than 180 mg/dL (1-2 hours)      Critically ill patients:  140 - 180 mg/dL   Lab Results  Component Value Date   GLUCAP 147 (H) 03/16/2019   HGBA1C 6.8 (H) 03/10/2019    Review of Glycemic Control Results for Deanna Landry, Deanna Landry (MRN [id number]) as of 03/16/2019 11:18  Ref. Range 03/15/2019 11:37 03/15/2019 16:17 03/15/2019 19:32 03/16/2019 07:26 03/16/2019 11:12  Glucose-Capillary Latest Ref Range: 70 - 99 mg/dL 186 (H) 155 (H) 127 (H) 147 (H) 305 (H)   Diabetes history:DM  Outpatient Diabetes medications:Levemir 30 units Daily, Novolog 10 units tid, Trulicity 1.5 mg Q Monday Current orders for Inpatient glycemic control:Levemir 30 units Daily, Novolog 0-15 units tid, Novolog 0-5 units qhs, Novolog 8 units tid meal coverage  Inpatient Diabetes Program Recommendations:   No documentation of hs CBG noted. -Increase Novolog meal coverage to 10 units tid if eats 50% if continues elevated today.  Thank you, [name]. Leann Mayweather, RN, MSN, CDE  Diabetes Coordinator Inpatient Glycemic Control Team Team Pager [phone] (8am-5pm) 03/16/2019 11:18 AM

## 2019-03-16 NOTE — Progress Notes (Addendum)
Spoke with pt concerning outpatient Mental Health Providers. Pt may call her insurance company for providers or call the numbers on discharge discharge sheet.

## 2019-03-16 NOTE — Progress Notes (Signed)
Pt given discharge instructions and all questions answered. Family will be taking patient home.

## 2019-03-17 LAB — CULTURE, BLOOD (ROUTINE X 2)
Culture: NO GROWTH
Culture: NO GROWTH
Special Requests: ADEQUATE
Special Requests: ADEQUATE

## 2019-03-20 DIAGNOSIS — I509 Heart failure, unspecified: Secondary | ICD-10-CM | POA: Diagnosis not present

## 2019-03-20 DIAGNOSIS — N189 Chronic kidney disease, unspecified: Secondary | ICD-10-CM | POA: Diagnosis not present

## 2019-03-20 DIAGNOSIS — I2510 Atherosclerotic heart disease of native coronary artery without angina pectoris: Secondary | ICD-10-CM | POA: Diagnosis not present

## 2019-03-20 DIAGNOSIS — E1022 Type 1 diabetes mellitus with diabetic chronic kidney disease: Secondary | ICD-10-CM | POA: Diagnosis not present

## 2019-03-20 DIAGNOSIS — Z7902 Long term (current) use of antithrombotics/antiplatelets: Secondary | ICD-10-CM | POA: Diagnosis not present

## 2019-03-20 DIAGNOSIS — Z8673 Personal history of transient ischemic attack (TIA), and cerebral infarction without residual deficits: Secondary | ICD-10-CM | POA: Diagnosis not present

## 2019-03-20 DIAGNOSIS — Z8701 Personal history of pneumonia (recurrent): Secondary | ICD-10-CM | POA: Diagnosis not present

## 2019-03-20 DIAGNOSIS — I130 Hypertensive heart and chronic kidney disease with heart failure and stage 1 through stage 4 chronic kidney disease, or unspecified chronic kidney disease: Secondary | ICD-10-CM | POA: Diagnosis not present

## 2019-03-21 DIAGNOSIS — I679 Cerebrovascular disease, unspecified: Secondary | ICD-10-CM | POA: Diagnosis not present

## 2019-03-21 DIAGNOSIS — N183 Chronic kidney disease, stage 3 (moderate): Secondary | ICD-10-CM | POA: Diagnosis not present

## 2019-03-21 DIAGNOSIS — I10 Essential (primary) hypertension: Secondary | ICD-10-CM | POA: Diagnosis not present

## 2019-03-21 DIAGNOSIS — M159 Polyosteoarthritis, unspecified: Secondary | ICD-10-CM | POA: Diagnosis not present

## 2019-03-21 DIAGNOSIS — J449 Chronic obstructive pulmonary disease, unspecified: Secondary | ICD-10-CM | POA: Diagnosis not present

## 2019-03-21 DIAGNOSIS — I509 Heart failure, unspecified: Secondary | ICD-10-CM | POA: Diagnosis not present

## 2019-03-21 DIAGNOSIS — G5603 Carpal tunnel syndrome, bilateral upper limbs: Secondary | ICD-10-CM | POA: Diagnosis not present

## 2019-03-21 DIAGNOSIS — F3341 Major depressive disorder, recurrent, in partial remission: Secondary | ICD-10-CM | POA: Diagnosis not present

## 2019-03-21 DIAGNOSIS — E118 Type 2 diabetes mellitus with unspecified complications: Secondary | ICD-10-CM | POA: Diagnosis not present

## 2019-03-22 ENCOUNTER — Other Ambulatory Visit: Payer: Self-pay

## 2019-03-22 NOTE — Patient Outreach (Signed)
[facility]) Care Management  03/22/2019  Deanna Landry 08-09-1971 [id number]    EMMI-General Discharge RED ON EMMI ALERT Day # 4 Date: 03/21/2019 Red Alert Reason: "Sad, hopeless, anxious, empty? Yes"   Outreach attempt # 1 to patient. No answer. RN CM left HIPAA compliant voicemail message along with contact info.     Plan: RN CM will make outreach attempt to patient within 3-4 business days. RN CM will send unsuccessful outreach letter to patient.  Enzo Montgomery, RN,BSN,CCM [place] Management Telephonic Care Management Coordinator Direct Phone: [phone] Toll Free: [phone] Fax: [phone]

## 2019-03-23 ENCOUNTER — Other Ambulatory Visit: Payer: Self-pay

## 2019-03-23 ENCOUNTER — Other Ambulatory Visit: Payer: Medicare HMO

## 2019-03-23 NOTE — Patient Outreach (Signed)
[facility]) Care Management  03/23/2019  Carmelia D Turvey [birth year] [id number]   EMMI-General Discharge RED ON EMMI ALERT Day #4 Date:03/21/2019 Red Alert Reason:"Sad, hopeless, anxious, empty? Yes"  Incoming call from patient returning RN CM call. Patient voices she is doing well. She denies any acute issues or concerns at this time. RN CM reviewed and addressed red alert with patient. She voices that she has "highs and lows" along with good and bad days. Patient with known history of depression. She denies feeling worse or sadder than normal. She reports that today is a good day and she is doing well. She had MD follow up appt earlier this week and goes back again next week. She reports that her insulin was adjusted that she takes at bedtimes. Patient reports some slightly "low" cbg readings in the morning. She is not eating a bed time snack when taking insulin. RN CM encouraged patient to do so and instructed her on possible food options and she voices understanding. She confirms that she has her meds and going today to pick up some refills. She denies any issues or concerns regarding them. She voices no further RN CM needs or concerns at this time. Patient has completed post discharge automated EMMI-General calls.    Plan: RN CM will close case at this time as no further interventions needed.  Enzo Montgomery, RN,BSN,CCM [place] Management Telephonic Care Management Coordinator Direct Phone: [phone] Toll Free: [phone] Fax: [phone]

## 2019-03-23 NOTE — Patient Outreach (Signed)
[facility]) Care Management  03/23/2019  Tangy D Woodhams 1971-01-15 [id number]   EMMI-General Discharge RED ON EMMI ALERT Day # 4 Date: 03/21/2019 Red Alert Reason: "Sad, hopeless, anxious, empty? Yes"    Outreach attempt #2 to patient. No answer at present.     Plan: RN CM will make outreach attempt to patient within 3-4 business days.   Enzo Montgomery, RN,BSN,CCM [place] Management Telephonic Care Management Coordinator Direct Phone: [phone] Toll Free: [phone] Fax: [phone]

## 2019-03-24 ENCOUNTER — Ambulatory Visit: Payer: Medicare HMO

## 2019-03-25 DIAGNOSIS — E1022 Type 1 diabetes mellitus with diabetic chronic kidney disease: Secondary | ICD-10-CM | POA: Diagnosis not present

## 2019-03-25 DIAGNOSIS — Z8673 Personal history of transient ischemic attack (TIA), and cerebral infarction without residual deficits: Secondary | ICD-10-CM | POA: Diagnosis not present

## 2019-03-25 DIAGNOSIS — I2510 Atherosclerotic heart disease of native coronary artery without angina pectoris: Secondary | ICD-10-CM | POA: Diagnosis not present

## 2019-03-25 DIAGNOSIS — I509 Heart failure, unspecified: Secondary | ICD-10-CM | POA: Diagnosis not present

## 2019-03-25 DIAGNOSIS — Z8701 Personal history of pneumonia (recurrent): Secondary | ICD-10-CM | POA: Diagnosis not present

## 2019-03-25 DIAGNOSIS — N189 Chronic kidney disease, unspecified: Secondary | ICD-10-CM | POA: Diagnosis not present

## 2019-03-25 DIAGNOSIS — Z7902 Long term (current) use of antithrombotics/antiplatelets: Secondary | ICD-10-CM | POA: Diagnosis not present

## 2019-03-25 DIAGNOSIS — I130 Hypertensive heart and chronic kidney disease with heart failure and stage 1 through stage 4 chronic kidney disease, or unspecified chronic kidney disease: Secondary | ICD-10-CM | POA: Diagnosis not present

## 2019-03-30 DIAGNOSIS — I679 Cerebrovascular disease, unspecified: Secondary | ICD-10-CM | POA: Diagnosis not present

## 2019-03-30 DIAGNOSIS — G5603 Carpal tunnel syndrome, bilateral upper limbs: Secondary | ICD-10-CM | POA: Diagnosis not present

## 2019-03-30 DIAGNOSIS — E118 Type 2 diabetes mellitus with unspecified complications: Secondary | ICD-10-CM | POA: Diagnosis not present

## 2019-03-30 DIAGNOSIS — J449 Chronic obstructive pulmonary disease, unspecified: Secondary | ICD-10-CM | POA: Diagnosis not present

## 2019-03-30 DIAGNOSIS — I509 Heart failure, unspecified: Secondary | ICD-10-CM | POA: Diagnosis not present

## 2019-03-30 DIAGNOSIS — M159 Polyosteoarthritis, unspecified: Secondary | ICD-10-CM | POA: Diagnosis not present

## 2019-03-30 DIAGNOSIS — I10 Essential (primary) hypertension: Secondary | ICD-10-CM | POA: Diagnosis not present

## 2019-03-30 DIAGNOSIS — N183 Chronic kidney disease, stage 3 (moderate): Secondary | ICD-10-CM | POA: Diagnosis not present

## 2019-03-30 DIAGNOSIS — F3341 Major depressive disorder, recurrent, in partial remission: Secondary | ICD-10-CM | POA: Diagnosis not present

## 2019-04-06 DIAGNOSIS — E118 Type 2 diabetes mellitus with unspecified complications: Secondary | ICD-10-CM | POA: Diagnosis not present

## 2019-04-06 DIAGNOSIS — I679 Cerebrovascular disease, unspecified: Secondary | ICD-10-CM | POA: Diagnosis not present

## 2019-04-06 DIAGNOSIS — J449 Chronic obstructive pulmonary disease, unspecified: Secondary | ICD-10-CM | POA: Diagnosis not present

## 2019-04-06 DIAGNOSIS — N183 Chronic kidney disease, stage 3 (moderate): Secondary | ICD-10-CM | POA: Diagnosis not present

## 2019-04-06 DIAGNOSIS — I10 Essential (primary) hypertension: Secondary | ICD-10-CM | POA: Diagnosis not present

## 2019-04-06 DIAGNOSIS — G5603 Carpal tunnel syndrome, bilateral upper limbs: Secondary | ICD-10-CM | POA: Diagnosis not present

## 2019-04-06 DIAGNOSIS — D649 Anemia, unspecified: Secondary | ICD-10-CM | POA: Diagnosis not present

## 2019-04-06 DIAGNOSIS — F3341 Major depressive disorder, recurrent, in partial remission: Secondary | ICD-10-CM | POA: Diagnosis not present

## 2019-04-06 DIAGNOSIS — I509 Heart failure, unspecified: Secondary | ICD-10-CM | POA: Diagnosis not present

## 2019-04-07 DIAGNOSIS — Z8701 Personal history of pneumonia (recurrent): Secondary | ICD-10-CM | POA: Diagnosis not present

## 2019-04-07 DIAGNOSIS — N189 Chronic kidney disease, unspecified: Secondary | ICD-10-CM | POA: Diagnosis not present

## 2019-04-07 DIAGNOSIS — Z7902 Long term (current) use of antithrombotics/antiplatelets: Secondary | ICD-10-CM | POA: Diagnosis not present

## 2019-04-07 DIAGNOSIS — Z8673 Personal history of transient ischemic attack (TIA), and cerebral infarction without residual deficits: Secondary | ICD-10-CM | POA: Diagnosis not present

## 2019-04-07 DIAGNOSIS — E1022 Type 1 diabetes mellitus with diabetic chronic kidney disease: Secondary | ICD-10-CM | POA: Diagnosis not present

## 2019-04-07 DIAGNOSIS — I509 Heart failure, unspecified: Secondary | ICD-10-CM | POA: Diagnosis not present

## 2019-04-07 DIAGNOSIS — I2510 Atherosclerotic heart disease of native coronary artery without angina pectoris: Secondary | ICD-10-CM | POA: Diagnosis not present

## 2019-04-07 DIAGNOSIS — E113552 Type 2 diabetes mellitus with stable proliferative diabetic retinopathy, left eye: Secondary | ICD-10-CM | POA: Diagnosis not present

## 2019-04-07 DIAGNOSIS — I130 Hypertensive heart and chronic kidney disease with heart failure and stage 1 through stage 4 chronic kidney disease, or unspecified chronic kidney disease: Secondary | ICD-10-CM | POA: Diagnosis not present

## 2019-04-13 DIAGNOSIS — J449 Chronic obstructive pulmonary disease, unspecified: Secondary | ICD-10-CM | POA: Diagnosis not present

## 2019-04-13 DIAGNOSIS — E1143 Type 2 diabetes mellitus with diabetic autonomic (poly)neuropathy: Secondary | ICD-10-CM | POA: Diagnosis not present

## 2019-04-13 DIAGNOSIS — N183 Chronic kidney disease, stage 3 (moderate): Secondary | ICD-10-CM | POA: Diagnosis not present

## 2019-04-13 DIAGNOSIS — F3341 Major depressive disorder, recurrent, in partial remission: Secondary | ICD-10-CM | POA: Diagnosis not present

## 2019-04-13 DIAGNOSIS — E118 Type 2 diabetes mellitus with unspecified complications: Secondary | ICD-10-CM | POA: Diagnosis not present

## 2019-04-13 DIAGNOSIS — G5603 Carpal tunnel syndrome, bilateral upper limbs: Secondary | ICD-10-CM | POA: Diagnosis not present

## 2019-04-13 DIAGNOSIS — E875 Hyperkalemia: Secondary | ICD-10-CM | POA: Diagnosis not present

## 2019-04-13 DIAGNOSIS — I509 Heart failure, unspecified: Secondary | ICD-10-CM | POA: Diagnosis not present

## 2019-04-13 DIAGNOSIS — I10 Essential (primary) hypertension: Secondary | ICD-10-CM | POA: Diagnosis not present

## 2019-04-14 DIAGNOSIS — I130 Hypertensive heart and chronic kidney disease with heart failure and stage 1 through stage 4 chronic kidney disease, or unspecified chronic kidney disease: Secondary | ICD-10-CM | POA: Diagnosis not present

## 2019-04-14 DIAGNOSIS — Z7902 Long term (current) use of antithrombotics/antiplatelets: Secondary | ICD-10-CM | POA: Diagnosis not present

## 2019-04-14 DIAGNOSIS — N189 Chronic kidney disease, unspecified: Secondary | ICD-10-CM | POA: Diagnosis not present

## 2019-04-14 DIAGNOSIS — I2510 Atherosclerotic heart disease of native coronary artery without angina pectoris: Secondary | ICD-10-CM | POA: Diagnosis not present

## 2019-04-14 DIAGNOSIS — E1022 Type 1 diabetes mellitus with diabetic chronic kidney disease: Secondary | ICD-10-CM | POA: Diagnosis not present

## 2019-04-14 DIAGNOSIS — Z8673 Personal history of transient ischemic attack (TIA), and cerebral infarction without residual deficits: Secondary | ICD-10-CM | POA: Diagnosis not present

## 2019-04-14 DIAGNOSIS — Z8701 Personal history of pneumonia (recurrent): Secondary | ICD-10-CM | POA: Diagnosis not present

## 2019-04-14 DIAGNOSIS — I509 Heart failure, unspecified: Secondary | ICD-10-CM | POA: Diagnosis not present

## 2019-04-15 DIAGNOSIS — I5042 Chronic combined systolic (congestive) and diastolic (congestive) heart failure: Secondary | ICD-10-CM | POA: Diagnosis not present

## 2019-04-18 ENCOUNTER — Telehealth: Payer: Self-pay | Admitting: Sports Medicine

## 2019-04-18 NOTE — Telephone Encounter (Signed)
Left voicemail for patient to call back and schedule NP appointment. Dx: diabetic neuropathy, ref. Dr. Cher Nakai

## 2019-04-24 ENCOUNTER — Other Ambulatory Visit: Payer: Self-pay

## 2019-04-24 ENCOUNTER — Encounter: Payer: Self-pay | Admitting: Podiatry

## 2019-04-24 ENCOUNTER — Ambulatory Visit (INDEPENDENT_AMBULATORY_CARE_PROVIDER_SITE_OTHER): Payer: Medicare HMO | Admitting: Podiatry

## 2019-04-24 VITALS — Temp 97.0°F | Resp 16 | Ht 62.0 in | Wt 184.0 lb

## 2019-04-24 DIAGNOSIS — L84 Corns and callosities: Secondary | ICD-10-CM

## 2019-04-24 DIAGNOSIS — Z89429 Acquired absence of other toe(s), unspecified side: Secondary | ICD-10-CM | POA: Diagnosis not present

## 2019-04-24 DIAGNOSIS — E1142 Type 2 diabetes mellitus with diabetic polyneuropathy: Secondary | ICD-10-CM

## 2019-04-24 DIAGNOSIS — M2042 Other hammer toe(s) (acquired), left foot: Secondary | ICD-10-CM

## 2019-04-24 DIAGNOSIS — M2041 Other hammer toe(s) (acquired), right foot: Secondary | ICD-10-CM | POA: Diagnosis not present

## 2019-04-24 NOTE — Progress Notes (Signed)
Subjective:  Patient ID: Deanna Landry, female    DOB: [birth year],  MRN: [id number]  Chief Complaint  Patient presents with  . foot care    Diabetic nail care -pt stats she is legally blind  . Diabetes    FBS: 64 A1C: 6.7 PCP: Truman Hayward x 1 wk    48 y.o. female presents  for diabetic foot care. Last AMBS was 64. Reports numbness and tingling in the left foot. Reports cramping in legs and thighs.  Review of Systems: Negative except as noted in the HPI. Denies N/V/F/Ch.  Past Medical History:  Diagnosis Date  . CHF (congestive heart failure) ([place])   . Diabetes mellitus without complication ([place])   . Hypertension   . Myocardial infarct, old   . Renal disorder   . Stroke [facility])     Current Outpatient Medications:  .  ACCU-CHEK AVIVA PLUS test strip, USE TO CHECK BLOOD SUGAR 4 TIMES A DAY, Disp: , Rfl:  .  amLODipine (NORVASC) 10 MG tablet, Take 10 mg by mouth daily., Disp: , Rfl:  .  aspirin EC 81 MG tablet, Take 81 mg by mouth daily., Disp: , Rfl:  .  atorvastatin (LIPITOR) 80 MG tablet, Take 80 mg by mouth daily., Disp: , Rfl:  .  atropine 1 % ophthalmic solution, Place 1 drop into the right eye 2 (two) times daily., Disp: , Rfl:  .  buPROPion (WELLBUTRIN XL) 150 MG 24 hr tablet, Take 150 mg by mouth daily., Disp: , Rfl:  .  clopidogrel (PLAVIX) 75 MG tablet, Take 75 mg by mouth daily., Disp: , Rfl:  .  [employer] ZINC OXIDE 20 % ointment, APPLY TOPICALLY AS NEEDED FOR IRRITATION., Disp: , Rfl:  .  dorzolamide-timolol (COSOPT) 22.3-6.8 MG/ML ophthalmic solution, Place 1 drop into the left eye 2 (two) times daily., Disp: , Rfl:  .  isosorbide dinitrate (ISORDIL) 20 MG tablet, Take 20 mg by mouth 2 (two) times daily., Disp: , Rfl:  .  latanoprost (XALATAN) 0.005 % ophthalmic solution, Place 1 drop into both eyes every evening., Disp: , Rfl:  .  LEVEMIR 100 UNIT/ML injection, Inject 30 Units into the skin daily. QAM, Disp: , Rfl:  .  levothyroxine (SYNTHROID) 50 MCG tablet, Take 50 mcg by mouth  daily., Disp: , Rfl:  .  liver oil-zinc oxide (DESITIN) 40 % ointment, Apply topically as needed for irritation., Disp: 56.7 g, Rfl: 0 .  metoCLOPramide (REGLAN) 5 MG tablet, Take 1 tablet (5 mg total) by mouth 3 (three) times daily before meals., Disp: 90 tablet, Rfl: 0 .  NOVOLOG FLEXPEN 100 UNIT/ML FlexPen, Inject 10 Units into the skin 3 (three) times daily., Disp: , Rfl:  .  ondansetron (ZOFRAN) 4 MG tablet, Take 1 tablet (4 mg total) by mouth every 6 (six) hours as needed for nausea., Disp: 20 tablet, Rfl: 0 .  pantoprazole (PROTONIX) 40 MG tablet, Take 40 mg by mouth daily., Disp: , Rfl:  .  prednisoLONE acetate (PRED FORTE) 1 % ophthalmic suspension, Place 1 drop into the left eye 2 (two) times daily., Disp: , Rfl:  .  pregabalin (LYRICA) 75 MG capsule, Take 75 mg by mouth 2 (two) times daily., Disp: , Rfl:  .  sertraline (ZOLOFT) 100 MG tablet, Take 100 mg by mouth daily., Disp: , Rfl:  .  TRULICITY 1.5 IR/4.8NI SOPN, Inject 1.5 mg into the skin once a week. On Mondays, Disp: , Rfl:   Social History   Tobacco Use  Smoking Status  Never Smoker  Smokeless Tobacco Never Used    No Known Allergies Objective:   Vitals:   04/24/19 1329  Resp: 16  Temp: (!) 97 F (36.1 C)   Body mass index is 33.65 kg/m. Constitutional Well developed. Well nourished.  Vascular Dorsalis pedis pulses present 1+ bilaterally  Posterior tibial pulses present 1+ bilaterally  Pedal hair growth diminished. Capillary refill normal to all digits.  No cyanosis or clubbing noted.  Neurologic Normal speech. Oriented to person, place, and time. Epicritic sensation to light touch grossly present bilaterally. Protective sensation with 5.07 monofilament  absent left.  Dermatologic Nails elongated, thickened, dystrophic. No open wounds. HPK bilat 1st hallux IPJ, right 1st MPJ  Orthopedic: Normal joint ROM without pain or crepitus bilaterally. Hammertoe deformities bilat No bony tenderness.  Partial  amputation Right 2nd toe   Assessment:   1. DM type 2 with diabetic peripheral neuropathy (HCC)   2. Callus of foot   3. Hammer toes of both feet   4. Partial absence of toe ([place])    Plan:  Patient was evaluated and treated and all questions answered.  Diabetes with neuropathy, Onychomycosis -Educated on diabetic footcare. Diabetic risk level 2 -Nails x10 debrided sharply and manually with large nail nipper and rotary burr.  -Would benefit from DM shoes due to neuropathy, callus formation, HTs   Procedure: Nail Debridement Rationale: Patient meets criteria for routine foot care due to neuropathy Type of Debridement: manual, sharp debridement. Instrumentation: Nail nipper, rotary burr. Number of Nails: 9   Procedure: Paring of Lesion Rationale: painful hyperkeratotic lesion Type of Debridement: manual, sharp debridement. Instrumentation: 312 blade Number of Lesions: 3   No follow-ups on file.

## 2019-04-27 DIAGNOSIS — I509 Heart failure, unspecified: Secondary | ICD-10-CM | POA: Diagnosis not present

## 2019-04-27 DIAGNOSIS — M159 Polyosteoarthritis, unspecified: Secondary | ICD-10-CM | POA: Diagnosis not present

## 2019-04-27 DIAGNOSIS — I10 Essential (primary) hypertension: Secondary | ICD-10-CM | POA: Diagnosis not present

## 2019-04-27 DIAGNOSIS — F3341 Major depressive disorder, recurrent, in partial remission: Secondary | ICD-10-CM | POA: Diagnosis not present

## 2019-04-27 DIAGNOSIS — I679 Cerebrovascular disease, unspecified: Secondary | ICD-10-CM | POA: Diagnosis not present

## 2019-04-27 DIAGNOSIS — D649 Anemia, unspecified: Secondary | ICD-10-CM | POA: Diagnosis not present

## 2019-04-27 DIAGNOSIS — G5603 Carpal tunnel syndrome, bilateral upper limbs: Secondary | ICD-10-CM | POA: Diagnosis not present

## 2019-04-27 DIAGNOSIS — J449 Chronic obstructive pulmonary disease, unspecified: Secondary | ICD-10-CM | POA: Diagnosis not present

## 2019-04-27 DIAGNOSIS — N183 Chronic kidney disease, stage 3 (moderate): Secondary | ICD-10-CM | POA: Diagnosis not present

## 2019-05-02 DIAGNOSIS — Z7902 Long term (current) use of antithrombotics/antiplatelets: Secondary | ICD-10-CM | POA: Diagnosis not present

## 2019-05-02 DIAGNOSIS — I2510 Atherosclerotic heart disease of native coronary artery without angina pectoris: Secondary | ICD-10-CM | POA: Diagnosis not present

## 2019-05-02 DIAGNOSIS — Z8673 Personal history of transient ischemic attack (TIA), and cerebral infarction without residual deficits: Secondary | ICD-10-CM | POA: Diagnosis not present

## 2019-05-02 DIAGNOSIS — N189 Chronic kidney disease, unspecified: Secondary | ICD-10-CM | POA: Diagnosis not present

## 2019-05-02 DIAGNOSIS — E1022 Type 1 diabetes mellitus with diabetic chronic kidney disease: Secondary | ICD-10-CM | POA: Diagnosis not present

## 2019-05-02 DIAGNOSIS — Z8701 Personal history of pneumonia (recurrent): Secondary | ICD-10-CM | POA: Diagnosis not present

## 2019-05-02 DIAGNOSIS — I130 Hypertensive heart and chronic kidney disease with heart failure and stage 1 through stage 4 chronic kidney disease, or unspecified chronic kidney disease: Secondary | ICD-10-CM | POA: Diagnosis not present

## 2019-05-02 DIAGNOSIS — I509 Heart failure, unspecified: Secondary | ICD-10-CM | POA: Diagnosis not present

## 2019-05-11 DIAGNOSIS — F3341 Major depressive disorder, recurrent, in partial remission: Secondary | ICD-10-CM | POA: Diagnosis not present

## 2019-05-11 DIAGNOSIS — I679 Cerebrovascular disease, unspecified: Secondary | ICD-10-CM | POA: Diagnosis not present

## 2019-05-11 DIAGNOSIS — N183 Chronic kidney disease, stage 3 (moderate): Secondary | ICD-10-CM | POA: Diagnosis not present

## 2019-05-11 DIAGNOSIS — H6122 Impacted cerumen, left ear: Secondary | ICD-10-CM | POA: Diagnosis not present

## 2019-05-11 DIAGNOSIS — I10 Essential (primary) hypertension: Secondary | ICD-10-CM | POA: Diagnosis not present

## 2019-05-11 DIAGNOSIS — I509 Heart failure, unspecified: Secondary | ICD-10-CM | POA: Diagnosis not present

## 2019-05-11 DIAGNOSIS — M159 Polyosteoarthritis, unspecified: Secondary | ICD-10-CM | POA: Diagnosis not present

## 2019-05-11 DIAGNOSIS — J449 Chronic obstructive pulmonary disease, unspecified: Secondary | ICD-10-CM | POA: Diagnosis not present

## 2019-05-11 DIAGNOSIS — G5603 Carpal tunnel syndrome, bilateral upper limbs: Secondary | ICD-10-CM | POA: Diagnosis not present

## 2019-05-16 DIAGNOSIS — I5042 Chronic combined systolic (congestive) and diastolic (congestive) heart failure: Secondary | ICD-10-CM | POA: Diagnosis not present

## 2019-05-25 DIAGNOSIS — M159 Polyosteoarthritis, unspecified: Secondary | ICD-10-CM | POA: Diagnosis not present

## 2019-05-25 DIAGNOSIS — J449 Chronic obstructive pulmonary disease, unspecified: Secondary | ICD-10-CM | POA: Diagnosis not present

## 2019-05-25 DIAGNOSIS — I509 Heart failure, unspecified: Secondary | ICD-10-CM | POA: Diagnosis not present

## 2019-05-25 DIAGNOSIS — I679 Cerebrovascular disease, unspecified: Secondary | ICD-10-CM | POA: Diagnosis not present

## 2019-05-25 DIAGNOSIS — N183 Chronic kidney disease, stage 3 (moderate): Secondary | ICD-10-CM | POA: Diagnosis not present

## 2019-05-25 DIAGNOSIS — E785 Hyperlipidemia, unspecified: Secondary | ICD-10-CM | POA: Diagnosis not present

## 2019-05-25 DIAGNOSIS — D649 Anemia, unspecified: Secondary | ICD-10-CM | POA: Diagnosis not present

## 2019-05-25 DIAGNOSIS — I10 Essential (primary) hypertension: Secondary | ICD-10-CM | POA: Diagnosis not present

## 2019-05-25 DIAGNOSIS — F3341 Major depressive disorder, recurrent, in partial remission: Secondary | ICD-10-CM | POA: Diagnosis not present

## 2019-05-25 DIAGNOSIS — G5603 Carpal tunnel syndrome, bilateral upper limbs: Secondary | ICD-10-CM | POA: Diagnosis not present

## 2019-06-08 DIAGNOSIS — G5603 Carpal tunnel syndrome, bilateral upper limbs: Secondary | ICD-10-CM | POA: Diagnosis not present

## 2019-06-08 DIAGNOSIS — J449 Chronic obstructive pulmonary disease, unspecified: Secondary | ICD-10-CM | POA: Diagnosis not present

## 2019-06-08 DIAGNOSIS — M159 Polyosteoarthritis, unspecified: Secondary | ICD-10-CM | POA: Diagnosis not present

## 2019-06-08 DIAGNOSIS — D649 Anemia, unspecified: Secondary | ICD-10-CM | POA: Diagnosis not present

## 2019-06-08 DIAGNOSIS — F419 Anxiety disorder, unspecified: Secondary | ICD-10-CM | POA: Diagnosis not present

## 2019-06-08 DIAGNOSIS — N183 Chronic kidney disease, stage 3 (moderate): Secondary | ICD-10-CM | POA: Diagnosis not present

## 2019-06-08 DIAGNOSIS — I509 Heart failure, unspecified: Secondary | ICD-10-CM | POA: Diagnosis not present

## 2019-06-08 DIAGNOSIS — I679 Cerebrovascular disease, unspecified: Secondary | ICD-10-CM | POA: Diagnosis not present

## 2019-06-08 DIAGNOSIS — I2510 Atherosclerotic heart disease of native coronary artery without angina pectoris: Secondary | ICD-10-CM | POA: Diagnosis not present

## 2019-06-15 DIAGNOSIS — I5042 Chronic combined systolic (congestive) and diastolic (congestive) heart failure: Secondary | ICD-10-CM | POA: Diagnosis not present

## 2019-06-23 DIAGNOSIS — M159 Polyosteoarthritis, unspecified: Secondary | ICD-10-CM | POA: Diagnosis not present

## 2019-06-23 DIAGNOSIS — F419 Anxiety disorder, unspecified: Secondary | ICD-10-CM | POA: Diagnosis not present

## 2019-06-23 DIAGNOSIS — G5603 Carpal tunnel syndrome, bilateral upper limbs: Secondary | ICD-10-CM | POA: Diagnosis not present

## 2019-06-23 DIAGNOSIS — I509 Heart failure, unspecified: Secondary | ICD-10-CM | POA: Diagnosis not present

## 2019-06-23 DIAGNOSIS — J449 Chronic obstructive pulmonary disease, unspecified: Secondary | ICD-10-CM | POA: Diagnosis not present

## 2019-06-23 DIAGNOSIS — N183 Chronic kidney disease, stage 3 (moderate): Secondary | ICD-10-CM | POA: Diagnosis not present

## 2019-06-23 DIAGNOSIS — I10 Essential (primary) hypertension: Secondary | ICD-10-CM | POA: Diagnosis not present

## 2019-06-23 DIAGNOSIS — I2510 Atherosclerotic heart disease of native coronary artery without angina pectoris: Secondary | ICD-10-CM | POA: Diagnosis not present

## 2019-06-23 DIAGNOSIS — I679 Cerebrovascular disease, unspecified: Secondary | ICD-10-CM | POA: Diagnosis not present

## 2019-06-23 DIAGNOSIS — D649 Anemia, unspecified: Secondary | ICD-10-CM | POA: Diagnosis not present

## 2019-06-23 DIAGNOSIS — E118 Type 2 diabetes mellitus with unspecified complications: Secondary | ICD-10-CM | POA: Diagnosis not present

## 2019-07-07 DIAGNOSIS — R300 Dysuria: Secondary | ICD-10-CM | POA: Diagnosis not present

## 2019-07-07 DIAGNOSIS — M159 Polyosteoarthritis, unspecified: Secondary | ICD-10-CM | POA: Diagnosis not present

## 2019-07-07 DIAGNOSIS — J449 Chronic obstructive pulmonary disease, unspecified: Secondary | ICD-10-CM | POA: Diagnosis not present

## 2019-07-07 DIAGNOSIS — N183 Chronic kidney disease, stage 3 (moderate): Secondary | ICD-10-CM | POA: Diagnosis not present

## 2019-07-07 DIAGNOSIS — G5603 Carpal tunnel syndrome, bilateral upper limbs: Secondary | ICD-10-CM | POA: Diagnosis not present

## 2019-07-07 DIAGNOSIS — D649 Anemia, unspecified: Secondary | ICD-10-CM | POA: Diagnosis not present

## 2019-07-07 DIAGNOSIS — I679 Cerebrovascular disease, unspecified: Secondary | ICD-10-CM | POA: Diagnosis not present

## 2019-07-07 DIAGNOSIS — I509 Heart failure, unspecified: Secondary | ICD-10-CM | POA: Diagnosis not present

## 2019-07-07 DIAGNOSIS — I2510 Atherosclerotic heart disease of native coronary artery without angina pectoris: Secondary | ICD-10-CM | POA: Diagnosis not present

## 2019-07-13 DIAGNOSIS — F419 Anxiety disorder, unspecified: Secondary | ICD-10-CM | POA: Diagnosis not present

## 2019-07-13 DIAGNOSIS — I509 Heart failure, unspecified: Secondary | ICD-10-CM | POA: Diagnosis not present

## 2019-07-13 DIAGNOSIS — D649 Anemia, unspecified: Secondary | ICD-10-CM | POA: Diagnosis not present

## 2019-07-13 DIAGNOSIS — M159 Polyosteoarthritis, unspecified: Secondary | ICD-10-CM | POA: Diagnosis not present

## 2019-07-13 DIAGNOSIS — G5603 Carpal tunnel syndrome, bilateral upper limbs: Secondary | ICD-10-CM | POA: Diagnosis not present

## 2019-07-13 DIAGNOSIS — I679 Cerebrovascular disease, unspecified: Secondary | ICD-10-CM | POA: Diagnosis not present

## 2019-07-13 DIAGNOSIS — I2510 Atherosclerotic heart disease of native coronary artery without angina pectoris: Secondary | ICD-10-CM | POA: Diagnosis not present

## 2019-07-13 DIAGNOSIS — J449 Chronic obstructive pulmonary disease, unspecified: Secondary | ICD-10-CM | POA: Diagnosis not present

## 2019-07-13 DIAGNOSIS — N183 Chronic kidney disease, stage 3 (moderate): Secondary | ICD-10-CM | POA: Diagnosis not present

## 2019-07-16 DIAGNOSIS — I5042 Chronic combined systolic (congestive) and diastolic (congestive) heart failure: Secondary | ICD-10-CM | POA: Diagnosis not present

## 2019-07-25 ENCOUNTER — Ambulatory Visit: Payer: Medicare HMO | Admitting: Podiatry

## 2019-07-25 ENCOUNTER — Inpatient Hospital Stay (HOSPITAL_COMMUNITY)
Admission: AD | Admit: 2019-07-25 | Discharge: 2019-07-29 | DRG: 493 | Disposition: A | Discharge status: Skilled Nursing Facility | Payer: Medicare HMO | Source: Other Acute Inpatient Hospital | Attending: Internal Medicine | Admitting: Internal Medicine

## 2019-07-25 DIAGNOSIS — E039 Hypothyroidism, unspecified: Secondary | ICD-10-CM | POA: Diagnosis not present

## 2019-07-25 DIAGNOSIS — H548 Legal blindness, as defined in USA: Secondary | ICD-10-CM | POA: Diagnosis present

## 2019-07-25 DIAGNOSIS — S82871A Displaced pilon fracture of right tibia, initial encounter for closed fracture: Secondary | ICD-10-CM | POA: Diagnosis present

## 2019-07-25 DIAGNOSIS — S82871D Displaced pilon fracture of right tibia, subsequent encounter for closed fracture with routine healing: Secondary | ICD-10-CM | POA: Diagnosis not present

## 2019-07-25 DIAGNOSIS — Z8673 Personal history of transient ischemic attack (TIA), and cerebral infarction without residual deficits: Secondary | ICD-10-CM

## 2019-07-25 DIAGNOSIS — E1022 Type 1 diabetes mellitus with diabetic chronic kidney disease: Secondary | ICD-10-CM | POA: Diagnosis not present

## 2019-07-25 DIAGNOSIS — Z833 Family history of diabetes mellitus: Secondary | ICD-10-CM

## 2019-07-25 DIAGNOSIS — S82831A Other fracture of upper and lower end of right fibula, initial encounter for closed fracture: Secondary | ICD-10-CM | POA: Diagnosis not present

## 2019-07-25 DIAGNOSIS — R52 Pain, unspecified: Secondary | ICD-10-CM | POA: Diagnosis not present

## 2019-07-25 DIAGNOSIS — T148XXA Other injury of unspecified body region, initial encounter: Secondary | ICD-10-CM

## 2019-07-25 DIAGNOSIS — I10 Essential (primary) hypertension: Secondary | ICD-10-CM | POA: Diagnosis present

## 2019-07-25 DIAGNOSIS — W19XXXA Unspecified fall, initial encounter: Secondary | ICD-10-CM | POA: Diagnosis not present

## 2019-07-25 DIAGNOSIS — Z79899 Other long term (current) drug therapy: Secondary | ICD-10-CM | POA: Diagnosis not present

## 2019-07-25 DIAGNOSIS — E1165 Type 2 diabetes mellitus with hyperglycemia: Secondary | ICD-10-CM | POA: Diagnosis not present

## 2019-07-25 DIAGNOSIS — I252 Old myocardial infarction: Secondary | ICD-10-CM | POA: Diagnosis not present

## 2019-07-25 DIAGNOSIS — R58 Hemorrhage, not elsewhere classified: Secondary | ICD-10-CM | POA: Diagnosis not present

## 2019-07-25 DIAGNOSIS — Z79890 Hormone replacement therapy (postmenopausal): Secondary | ICD-10-CM

## 2019-07-25 DIAGNOSIS — E1122 Type 2 diabetes mellitus with diabetic chronic kidney disease: Secondary | ICD-10-CM | POA: Diagnosis present

## 2019-07-25 DIAGNOSIS — Y92010 Kitchen of single-family (private) house as the place of occurrence of the external cause: Secondary | ICD-10-CM

## 2019-07-25 DIAGNOSIS — N179 Acute kidney failure, unspecified: Secondary | ICD-10-CM | POA: Diagnosis present

## 2019-07-25 DIAGNOSIS — S99921A Unspecified injury of right foot, initial encounter: Secondary | ICD-10-CM | POA: Diagnosis not present

## 2019-07-25 DIAGNOSIS — E669 Obesity, unspecified: Secondary | ICD-10-CM | POA: Diagnosis present

## 2019-07-25 DIAGNOSIS — D62 Acute posthemorrhagic anemia: Secondary | ICD-10-CM | POA: Diagnosis not present

## 2019-07-25 DIAGNOSIS — Z20828 Contact with and (suspected) exposure to other viral communicable diseases: Secondary | ICD-10-CM | POA: Diagnosis not present

## 2019-07-25 DIAGNOSIS — Z419 Encounter for procedure for purposes other than remedying health state, unspecified: Secondary | ICD-10-CM

## 2019-07-25 DIAGNOSIS — S82851A Displaced trimalleolar fracture of right lower leg, initial encounter for closed fracture: Principal | ICD-10-CM | POA: Diagnosis present

## 2019-07-25 DIAGNOSIS — W19XXXD Unspecified fall, subsequent encounter: Secondary | ICD-10-CM | POA: Diagnosis not present

## 2019-07-25 DIAGNOSIS — W010XXA Fall on same level from slipping, tripping and stumbling without subsequent striking against object, initial encounter: Secondary | ICD-10-CM | POA: Diagnosis present

## 2019-07-25 DIAGNOSIS — M6281 Muscle weakness (generalized): Secondary | ICD-10-CM | POA: Diagnosis not present

## 2019-07-25 DIAGNOSIS — H04123 Dry eye syndrome of bilateral lacrimal glands: Secondary | ICD-10-CM | POA: Diagnosis not present

## 2019-07-25 DIAGNOSIS — E1169 Type 2 diabetes mellitus with other specified complication: Secondary | ICD-10-CM | POA: Diagnosis not present

## 2019-07-25 DIAGNOSIS — N183 Chronic kidney disease, stage 3 unspecified: Secondary | ICD-10-CM | POA: Diagnosis not present

## 2019-07-25 DIAGNOSIS — Z823 Family history of stroke: Secondary | ICD-10-CM

## 2019-07-25 DIAGNOSIS — Z794 Long term (current) use of insulin: Secondary | ICD-10-CM

## 2019-07-25 DIAGNOSIS — Z6834 Body mass index (BMI) 34.0-34.9, adult: Secondary | ICD-10-CM | POA: Diagnosis not present

## 2019-07-25 DIAGNOSIS — Z01818 Encounter for other preprocedural examination: Secondary | ICD-10-CM

## 2019-07-25 DIAGNOSIS — M25571 Pain in right ankle and joints of right foot: Secondary | ICD-10-CM | POA: Diagnosis not present

## 2019-07-25 DIAGNOSIS — F3289 Other specified depressive episodes: Secondary | ICD-10-CM | POA: Diagnosis not present

## 2019-07-25 DIAGNOSIS — R609 Edema, unspecified: Secondary | ICD-10-CM | POA: Diagnosis not present

## 2019-07-25 DIAGNOSIS — Z7902 Long term (current) use of antithrombotics/antiplatelets: Secondary | ICD-10-CM

## 2019-07-25 DIAGNOSIS — Z7401 Bed confinement status: Secondary | ICD-10-CM | POA: Diagnosis not present

## 2019-07-25 DIAGNOSIS — M2550 Pain in unspecified joint: Secondary | ICD-10-CM | POA: Diagnosis not present

## 2019-07-25 DIAGNOSIS — S82851D Displaced trimalleolar fracture of right lower leg, subsequent encounter for closed fracture with routine healing: Secondary | ICD-10-CM | POA: Diagnosis not present

## 2019-07-25 DIAGNOSIS — G8918 Other acute postprocedural pain: Secondary | ICD-10-CM | POA: Diagnosis not present

## 2019-07-25 DIAGNOSIS — E119 Type 2 diabetes mellitus without complications: Secondary | ICD-10-CM | POA: Diagnosis not present

## 2019-07-25 DIAGNOSIS — S9304XA Dislocation of right ankle joint, initial encounter: Secondary | ICD-10-CM | POA: Diagnosis not present

## 2019-07-25 DIAGNOSIS — I959 Hypotension, unspecified: Secondary | ICD-10-CM | POA: Diagnosis not present

## 2019-07-25 DIAGNOSIS — I130 Hypertensive heart and chronic kidney disease with heart failure and stage 1 through stage 4 chronic kidney disease, or unspecified chronic kidney disease: Secondary | ICD-10-CM | POA: Diagnosis not present

## 2019-07-25 DIAGNOSIS — E861 Hypovolemia: Secondary | ICD-10-CM | POA: Diagnosis present

## 2019-07-25 DIAGNOSIS — S82891A Other fracture of right lower leg, initial encounter for closed fracture: Secondary | ICD-10-CM

## 2019-07-25 DIAGNOSIS — E875 Hyperkalemia: Secondary | ICD-10-CM | POA: Diagnosis present

## 2019-07-25 DIAGNOSIS — N189 Chronic kidney disease, unspecified: Secondary | ICD-10-CM | POA: Diagnosis not present

## 2019-07-25 DIAGNOSIS — I509 Heart failure, unspecified: Secondary | ICD-10-CM | POA: Diagnosis present

## 2019-07-25 NOTE — Progress Notes (Addendum)
Case discussed with EDP in [facility] ER.  Will need ORIF of Right ankle vs. External Fixator.  Tentatively planned for tomorrow, 07/27/19, with Dr. Doreatha Martin of Ortho Trauma.  Please ensure patient is NPO at [facility] tonight.  Full consult to come in am.

## 2019-07-26 ENCOUNTER — Encounter (HOSPITAL_COMMUNITY): Payer: Self-pay | Admitting: Internal Medicine

## 2019-07-26 ENCOUNTER — Inpatient Hospital Stay (HOSPITAL_COMMUNITY): Payer: Medicare HMO | Admitting: Anesthesiology

## 2019-07-26 ENCOUNTER — Encounter (HOSPITAL_COMMUNITY)
Admission: AD | Disposition: A | Discharge status: Skilled Nursing Facility | Payer: Self-pay | Source: Other Acute Inpatient Hospital | Attending: Internal Medicine

## 2019-07-26 ENCOUNTER — Inpatient Hospital Stay (HOSPITAL_COMMUNITY): Payer: Medicare HMO

## 2019-07-26 DIAGNOSIS — S82871A Displaced pilon fracture of right tibia, initial encounter for closed fracture: Secondary | ICD-10-CM

## 2019-07-26 DIAGNOSIS — E1169 Type 2 diabetes mellitus with other specified complication: Secondary | ICD-10-CM | POA: Diagnosis present

## 2019-07-26 DIAGNOSIS — I10 Essential (primary) hypertension: Secondary | ICD-10-CM

## 2019-07-26 DIAGNOSIS — E669 Obesity, unspecified: Secondary | ICD-10-CM

## 2019-07-26 DIAGNOSIS — S82891A Other fracture of right lower leg, initial encounter for closed fracture: Secondary | ICD-10-CM

## 2019-07-26 DIAGNOSIS — E119 Type 2 diabetes mellitus without complications: Secondary | ICD-10-CM | POA: Diagnosis present

## 2019-07-26 HISTORY — DX: Type 2 diabetes mellitus with other specified complication: E11.69

## 2019-07-26 HISTORY — DX: Displaced pilon fracture of right tibia, initial encounter for closed fracture: S82.871A

## 2019-07-26 HISTORY — DX: Type 2 diabetes mellitus with other specified complication: E66.9

## 2019-07-26 HISTORY — PX: ORIF ANKLE FRACTURE: SHX5408

## 2019-07-26 LAB — COMPREHENSIVE METABOLIC PANEL
ALT: 14 U/L (ref 0–44)
AST: 17 U/L (ref 15–41)
Albumin: 3.5 g/dL (ref 3.5–5.0)
Alkaline Phosphatase: 112 U/L (ref 38–126)
Anion gap: 9 (ref 5–15)
BUN: 28 mg/dL — ABNORMAL HIGH (ref 6–20)
CO2: 24 mmol/L (ref 22–32)
Calcium: 9.1 mg/dL (ref 8.9–10.3)
Chloride: 105 mmol/L (ref 98–111)
Creatinine, Ser: 2.29 mg/dL — ABNORMAL HIGH (ref 0.44–1.00)
GFR calc Af Amer: 29 mL/min — ABNORMAL LOW (ref >60)
GFR calc non Af Amer: 25 mL/min — ABNORMAL LOW (ref >60)
Glucose, Bld: 190 mg/dL — ABNORMAL HIGH (ref 70–99)
Potassium: 5.2 mmol/L — ABNORMAL HIGH (ref 3.5–5.1)
Sodium: 138 mmol/L (ref 135–145)
Total Bilirubin: 0.4 mg/dL (ref 0.3–1.2)
Total Protein: 7 g/dL (ref 6.5–8.1)

## 2019-07-26 LAB — CBC WITH DIFFERENTIAL/PLATELET
Abs Immature Granulocytes: 0.01 10*3/uL (ref 0.00–0.07)
Basophils Absolute: 0 10*3/uL (ref 0.0–0.1)
Basophils Relative: 0 %
Eosinophils Absolute: 0 10*3/uL (ref 0.0–0.5)
Eosinophils Relative: 1 %
HCT: 36.5 % (ref 36.0–46.0)
Hemoglobin: 11.6 g/dL — ABNORMAL LOW (ref 12.0–15.0)
Immature Granulocytes: 0 %
Lymphocytes Relative: 22 %
Lymphs Abs: 1.1 10*3/uL (ref 0.7–4.0)
MCH: 27.6 pg (ref 26.0–34.0)
MCHC: 31.8 g/dL (ref 30.0–36.0)
MCV: 86.7 fL (ref 80.0–100.0)
Monocytes Absolute: 0.4 10*3/uL (ref 0.1–1.0)
Monocytes Relative: 8 %
Neutro Abs: 3.4 10*3/uL (ref 1.7–7.7)
Neutrophils Relative %: 69 %
Platelets: 220 10*3/uL (ref 150–400)
RBC: 4.21 MIL/uL (ref 3.87–5.11)
RDW: 14.7 % (ref 11.5–15.5)
WBC: 5 10*3/uL (ref 4.0–10.5)
nRBC: 0 % (ref 0.0–0.2)

## 2019-07-26 LAB — SARS CORONAVIRUS 2 (TAT 6-24 HRS): SARS Coronavirus 2: NEGATIVE

## 2019-07-26 LAB — GLUCOSE, CAPILLARY
Glucose-Capillary: 184 mg/dL — ABNORMAL HIGH (ref 70–99)
Glucose-Capillary: 103 mg/dL — ABNORMAL HIGH (ref 70–99)
Glucose-Capillary: 118 mg/dL — ABNORMAL HIGH (ref 70–99)
Glucose-Capillary: 205 mg/dL — ABNORMAL HIGH (ref 70–99)
Glucose-Capillary: 255 mg/dL — ABNORMAL HIGH (ref 70–99)

## 2019-07-26 LAB — TYPE AND SCREEN
ABO/RH(D): O POS
Antibody Screen: NEGATIVE

## 2019-07-26 LAB — SURGICAL PCR SCREEN
MRSA, PCR: NEGATIVE
Staphylococcus aureus: NEGATIVE

## 2019-07-26 LAB — HIV ANTIBODY (ROUTINE TESTING W REFLEX): HIV Screen 4th Generation wRfx: NONREACTIVE

## 2019-07-26 LAB — ABO/RH: ABO/RH(D): O POS

## 2019-07-26 SURGERY — OPEN REDUCTION INTERNAL FIXATION (ORIF) ANKLE FRACTURE
Anesthesia: General | Site: Ankle | Laterality: Right

## 2019-07-26 MED ORDER — ONDANSETRON HCL 4 MG/2ML IJ SOLN: 4.0000 mg | Freq: Once | INTRAMUSCULAR | Status: DC | PRN

## 2019-07-26 MED ORDER — LIDOCAINE 2% (20 MG/ML) 5 ML SYRINGE
INTRAMUSCULAR | Status: AC
  Filled 2019-07-26: qty 5

## 2019-07-26 MED ORDER — SUCCINYLCHOLINE CHLORIDE 200 MG/10ML IV SOSY
PREFILLED_SYRINGE | INTRAVENOUS | Status: AC
  Filled 2019-07-26: qty 10

## 2019-07-26 MED ORDER — PREGABALIN 75 MG PO CAPS
75.0000 mg | ORAL_CAPSULE | Freq: Two times a day (BID) | ORAL | Status: DC
  Administered 2019-07-26 – 2019-07-29 (×6): 75 mg via ORAL
  Filled 2019-07-26 (×6): qty 1

## 2019-07-26 MED ORDER — MIDAZOLAM HCL 2 MG/2ML IJ SOLN
2.0000 mg | Freq: Once | INTRAMUSCULAR | Status: AC
  Administered 2019-07-26: 13:00:00 2 mg via INTRAVENOUS

## 2019-07-26 MED ORDER — DORZOLAMIDE HCL-TIMOLOL MAL 2-0.5 % OP SOLN
1.0000 [drp] | Freq: Two times a day (BID) | OPHTHALMIC | Status: DC
  Administered 2019-07-26 – 2019-07-29 (×6): 1 [drp] via OPHTHALMIC
  Filled 2019-07-26 (×2): qty 10

## 2019-07-26 MED ORDER — INSULIN ASPART 100 UNIT/ML ~~LOC~~ SOLN
0.0000 [IU] | SUBCUTANEOUS | Status: DC
  Administered 2019-07-26: 5 [IU] via SUBCUTANEOUS
  Administered 2019-07-26: 2 [IU] via SUBCUTANEOUS
  Administered 2019-07-26: 3 [IU] via SUBCUTANEOUS

## 2019-07-26 MED ORDER — VANCOMYCIN HCL 1000 MG IV SOLR
INTRAVENOUS | Status: AC
  Filled 2019-07-26: qty 1000

## 2019-07-26 MED ORDER — PROPOFOL 10 MG/ML IV BOLUS
INTRAVENOUS | Status: AC
  Filled 2019-07-26: qty 20

## 2019-07-26 MED ORDER — FENTANYL CITRATE (PF) 250 MCG/5ML IJ SOLN
INTRAMUSCULAR | Status: AC
  Filled 2019-07-26: qty 5

## 2019-07-26 MED ORDER — BACITRACIN ZINC 500 UNIT/GM EX OINT
TOPICAL_OINTMENT | CUTANEOUS | Status: AC
  Filled 2019-07-26: qty 28.35

## 2019-07-26 MED ORDER — MORPHINE SULFATE (PF) 2 MG/ML IV SOLN
1.0000 mg | INTRAVENOUS | Status: DC | PRN
  Administered 2019-07-26 – 2019-07-27 (×6): 1 mg via INTRAVENOUS
  Filled 2019-07-26 (×6): qty 1

## 2019-07-26 MED ORDER — 0.9 % SODIUM CHLORIDE (POUR BTL) OPTIME
TOPICAL | Status: DC | PRN
  Administered 2019-07-26: 1000 mL

## 2019-07-26 MED ORDER — DEXAMETHASONE SODIUM PHOSPHATE 10 MG/ML IJ SOLN
INTRAMUSCULAR | Status: AC
  Filled 2019-07-26: qty 1

## 2019-07-26 MED ORDER — INSULIN ASPART 100 UNIT/ML ~~LOC~~ SOLN
SUBCUTANEOUS | Status: AC
  Filled 2019-07-26: qty 1

## 2019-07-26 MED ORDER — ATROPINE SULFATE 1 % OP SOLN
1.0000 [drp] | Freq: Two times a day (BID) | OPHTHALMIC | Status: DC
  Administered 2019-07-26 – 2019-07-29 (×6): 1 [drp] via OPHTHALMIC
  Filled 2019-07-26 (×2): qty 5

## 2019-07-26 MED ORDER — ROCURONIUM BROMIDE 10 MG/ML (PF) SYRINGE
PREFILLED_SYRINGE | INTRAVENOUS | Status: AC
  Filled 2019-07-26: qty 10

## 2019-07-26 MED ORDER — ATORVASTATIN CALCIUM 80 MG PO TABS
80.0000 mg | ORAL_TABLET | Freq: Every day | ORAL | Status: DC
  Administered 2019-07-26 – 2019-07-29 (×4): 80 mg via ORAL
  Filled 2019-07-26 (×4): qty 1

## 2019-07-26 MED ORDER — HYDROCODONE-ACETAMINOPHEN 7.5-325 MG PO TABS: 1.0000 | ORAL_TABLET | Freq: Once | ORAL | Status: DC | PRN

## 2019-07-26 MED ORDER — HYDROMORPHONE HCL 1 MG/ML IJ SOLN
INTRAMUSCULAR | Status: AC
  Filled 2019-07-26: qty 0.5

## 2019-07-26 MED ORDER — LATANOPROST 0.005 % OP SOLN
1.0000 [drp] | Freq: Every evening | OPHTHALMIC | Status: DC
  Administered 2019-07-26 – 2019-07-28 (×3): 1 [drp] via OPHTHALMIC
  Filled 2019-07-26: qty 2.5

## 2019-07-26 MED ORDER — EPHEDRINE 5 MG/ML INJ
INTRAVENOUS | Status: AC
  Filled 2019-07-26: qty 10

## 2019-07-26 MED ORDER — LEVOTHYROXINE SODIUM 50 MCG PO TABS
50.0000 ug | ORAL_TABLET | Freq: Every day | ORAL | Status: DC
  Administered 2019-07-27 – 2019-07-29 (×3): 50 ug via ORAL
  Filled 2019-07-26 (×3): qty 1

## 2019-07-26 MED ORDER — DEXMEDETOMIDINE HCL 200 MCG/2ML IV SOLN
INTRAVENOUS | Status: DC | PRN
  Administered 2019-07-26: 16 ug via INTRAVENOUS

## 2019-07-26 MED ORDER — MIDAZOLAM HCL 2 MG/2ML IJ SOLN
INTRAMUSCULAR | Status: AC
  Filled 2019-07-26: qty 2

## 2019-07-26 MED ORDER — FENTANYL CITRATE (PF) 100 MCG/2ML IJ SOLN
INTRAMUSCULAR | Status: AC
  Administered 2019-07-26: 13:00:00 50 ug via INTRAVENOUS
  Filled 2019-07-26: qty 2

## 2019-07-26 MED ORDER — LIDOCAINE 2% (20 MG/ML) 5 ML SYRINGE
INTRAMUSCULAR | Status: DC | PRN
  Administered 2019-07-26: 100 mg via INTRAVENOUS

## 2019-07-26 MED ORDER — CEFAZOLIN SODIUM-DEXTROSE 2-4 GM/100ML-% IV SOLN
2.0000 g | Freq: Three times a day (TID) | INTRAVENOUS | Status: AC
  Administered 2019-07-26 – 2019-07-27 (×3): 2 g via INTRAVENOUS
  Filled 2019-07-26 (×5): qty 100

## 2019-07-26 MED ORDER — PREDNISOLONE ACETATE 1 % OP SUSP
1.0000 [drp] | Freq: Two times a day (BID) | OPHTHALMIC | Status: DC
  Administered 2019-07-26 – 2019-07-29 (×6): 1 [drp] via OPHTHALMIC
  Filled 2019-07-26 (×2): qty 5

## 2019-07-26 MED ORDER — ADULT MULTIVITAMIN W/MINERALS CH
1.0000 | ORAL_TABLET | Freq: Every day | ORAL | Status: DC
  Administered 2019-07-27 – 2019-07-29 (×3): 1 via ORAL
  Filled 2019-07-26 (×4): qty 1

## 2019-07-26 MED ORDER — SERTRALINE HCL 100 MG PO TABS
100.0000 mg | ORAL_TABLET | Freq: Every day | ORAL | Status: DC
  Administered 2019-07-26 – 2019-07-29 (×4): 100 mg via ORAL
  Filled 2019-07-26 (×4): qty 1

## 2019-07-26 MED ORDER — INSULIN ASPART 100 UNIT/ML ~~LOC~~ SOLN
0.0000 [IU] | Freq: Three times a day (TID) | SUBCUTANEOUS | Status: DC
  Administered 2019-07-27: 3 [IU] via SUBCUTANEOUS
  Administered 2019-07-27: 2 [IU] via SUBCUTANEOUS
  Administered 2019-07-27: 3 [IU] via SUBCUTANEOUS
  Administered 2019-07-28 (×2): 5 [IU] via SUBCUTANEOUS
  Administered 2019-07-28: 3 [IU] via SUBCUTANEOUS
  Administered 2019-07-28: 19:00:00 9 [IU] via SUBCUTANEOUS
  Administered 2019-07-29 (×2): 5 [IU] via SUBCUTANEOUS

## 2019-07-26 MED ORDER — MIDAZOLAM HCL 2 MG/2ML IJ SOLN
INTRAMUSCULAR | Status: DC | PRN
  Administered 2019-07-26: 2 mg via INTRAVENOUS

## 2019-07-26 MED ORDER — PHENYLEPHRINE 40 MCG/ML (10ML) SYRINGE FOR IV PUSH (FOR BLOOD PRESSURE SUPPORT)
PREFILLED_SYRINGE | INTRAVENOUS | Status: AC
  Filled 2019-07-26: qty 10

## 2019-07-26 MED ORDER — ONDANSETRON HCL 4 MG/2ML IJ SOLN
INTRAMUSCULAR | Status: DC | PRN
  Administered 2019-07-26: 4 mg via INTRAVENOUS

## 2019-07-26 MED ORDER — EPHEDRINE SULFATE-NACL 50-0.9 MG/10ML-% IV SOSY
PREFILLED_SYRINGE | INTRAVENOUS | Status: DC | PRN
  Administered 2019-07-26: 10 mg via INTRAVENOUS

## 2019-07-26 MED ORDER — BUPROPION HCL ER (XL) 150 MG PO TB24
150.0000 mg | ORAL_TABLET | Freq: Every day | ORAL | Status: DC
  Administered 2019-07-26 – 2019-07-29 (×4): 150 mg via ORAL
  Filled 2019-07-26 (×4): qty 1

## 2019-07-26 MED ORDER — MIDAZOLAM HCL 2 MG/2ML IJ SOLN
INTRAMUSCULAR | Status: AC
  Administered 2019-07-26: 13:00:00 2 mg via INTRAVENOUS
  Filled 2019-07-26: qty 2

## 2019-07-26 MED ORDER — PROPOFOL 10 MG/ML IV BOLUS
INTRAVENOUS | Status: DC | PRN
  Administered 2019-07-26: 150 mg via INTRAVENOUS

## 2019-07-26 MED ORDER — CHLORHEXIDINE GLUCONATE 4 % EX LIQD: 60.0000 mL | Freq: Once | CUTANEOUS | Status: DC

## 2019-07-26 MED ORDER — HYDROMORPHONE HCL 1 MG/ML IJ SOLN
0.2500 mg | INTRAMUSCULAR | Status: DC | PRN
  Administered 2019-07-26: 15:00:00 0.5 mg via INTRAVENOUS

## 2019-07-26 MED ORDER — POVIDONE-IODINE 10 % EX SWAB: 2.0000 "application " | Freq: Once | CUTANEOUS | Status: DC

## 2019-07-26 MED ORDER — INFLUENZA VAC SPLIT QUAD 0.5 ML IM SUSY
0.5000 mL | PREFILLED_SYRINGE | INTRAMUSCULAR | Status: DC
  Filled 2019-07-26: qty 0.5

## 2019-07-26 MED ORDER — MEPERIDINE HCL 25 MG/ML IJ SOLN: 6.2500 mg | INTRAMUSCULAR | Status: DC | PRN

## 2019-07-26 MED ORDER — BUPIVACAINE HCL (PF) 0.5 % IJ SOLN
INTRAMUSCULAR | Status: AC
  Filled 2019-07-26: qty 30

## 2019-07-26 MED ORDER — ONDANSETRON HCL 4 MG/2ML IJ SOLN
INTRAMUSCULAR | Status: AC
  Filled 2019-07-26: qty 2

## 2019-07-26 MED ORDER — PHENYLEPHRINE 40 MCG/ML (10ML) SYRINGE FOR IV PUSH (FOR BLOOD PRESSURE SUPPORT)
PREFILLED_SYRINGE | INTRAVENOUS | Status: DC | PRN
  Administered 2019-07-26 (×4): 80 ug via INTRAVENOUS

## 2019-07-26 MED ORDER — FENTANYL CITRATE (PF) 100 MCG/2ML IJ SOLN
50.0000 ug | Freq: Once | INTRAMUSCULAR | Status: AC
  Administered 2019-07-26: 13:00:00 50 ug via INTRAVENOUS

## 2019-07-26 MED ORDER — ISOSORBIDE DINITRATE 20 MG PO TABS
20.0000 mg | ORAL_TABLET | Freq: Two times a day (BID) | ORAL | Status: DC
  Administered 2019-07-26 – 2019-07-29 (×6): 20 mg via ORAL
  Filled 2019-07-26 (×8): qty 1

## 2019-07-26 MED ORDER — ACETAMINOPHEN 500 MG PO TABS
1000.0000 mg | ORAL_TABLET | Freq: Four times a day (QID) | ORAL | Status: DC
  Administered 2019-07-26 – 2019-07-29 (×12): 1000 mg via ORAL
  Filled 2019-07-26 (×12): qty 2

## 2019-07-26 MED ORDER — CEFAZOLIN SODIUM-DEXTROSE 2-4 GM/100ML-% IV SOLN
2.0000 g | INTRAVENOUS | Status: AC
  Administered 2019-07-26: 2 g via INTRAVENOUS
  Filled 2019-07-26: qty 100

## 2019-07-26 MED ORDER — ROPIVACAINE HCL 5 MG/ML IJ SOLN
INTRAMUSCULAR | Status: DC | PRN
  Administered 2019-07-26: 30 mL via PERINEURAL

## 2019-07-26 MED ORDER — EPINEPHRINE PF 1 MG/ML IJ SOLN
INTRAMUSCULAR | Status: AC
  Filled 2019-07-26: qty 1

## 2019-07-26 MED ORDER — OXYCODONE HCL 5 MG PO TABS
5.0000 mg | ORAL_TABLET | ORAL | Status: DC | PRN
  Administered 2019-07-26 – 2019-07-27 (×4): 15 mg via ORAL
  Administered 2019-07-27 (×2): 10 mg via ORAL
  Administered 2019-07-28 – 2019-07-29 (×6): 15 mg via ORAL
  Filled 2019-07-26 (×7): qty 3
  Filled 2019-07-26 (×2): qty 2
  Filled 2019-07-26 (×3): qty 3

## 2019-07-26 MED ORDER — ACETAMINOPHEN 10 MG/ML IV SOLN: 1000.0000 mg | Freq: Once | INTRAVENOUS | Status: DC | PRN

## 2019-07-26 MED ORDER — AMLODIPINE BESYLATE 10 MG PO TABS
10.0000 mg | ORAL_TABLET | Freq: Every day | ORAL | Status: DC
  Administered 2019-07-26 – 2019-07-29 (×4): 10 mg via ORAL
  Filled 2019-07-26 (×4): qty 1

## 2019-07-26 MED ORDER — ENOXAPARIN SODIUM 40 MG/0.4ML ~~LOC~~ SOLN
40.0000 mg | SUBCUTANEOUS | Status: DC
  Administered 2019-07-27: 40 mg via SUBCUTANEOUS
  Filled 2019-07-26: qty 0.4

## 2019-07-26 MED ORDER — VANCOMYCIN HCL 1000 MG IV SOLR
INTRAVENOUS | Status: DC | PRN
  Administered 2019-07-26: 1000 mg

## 2019-07-26 MED ORDER — DEXAMETHASONE SODIUM PHOSPHATE 10 MG/ML IJ SOLN
INTRAMUSCULAR | Status: DC | PRN
  Administered 2019-07-26: 4 mg via INTRAVENOUS

## 2019-07-26 MED ORDER — CLONIDINE HCL (ANALGESIA) 100 MCG/ML EP SOLN
EPIDURAL | Status: DC | PRN
  Administered 2019-07-26: 100 ug

## 2019-07-26 MED ORDER — SODIUM CHLORIDE 0.9 % IV SOLN
INTRAVENOUS | Status: DC
  Administered 2019-07-26: 12:00:00 via INTRAVENOUS

## 2019-07-26 SURGICAL SUPPLY — 72 items
BANDAGE ESMARK 6X9 LF (GAUZE/BANDAGES/DRESSINGS) ×1 IMPLANT
BIT DRILL 2.5 X LONG (BIT) ×1
BIT DRILL CANN 3.2MM (BIT) ×1 IMPLANT
BIT DRILL PERC QC 2.8X200 100 (BIT) ×1 IMPLANT
BIT DRILL X LONG 2.5 (BIT) ×1 IMPLANT
BNDG COHESIVE 4X5 TAN STRL (GAUZE/BANDAGES/DRESSINGS) ×3 IMPLANT
BNDG ELASTIC 3X5.8 VLCR STR LF (GAUZE/BANDAGES/DRESSINGS) ×3 IMPLANT
BNDG ELASTIC 4X5.8 VLCR STR LF (GAUZE/BANDAGES/DRESSINGS) IMPLANT
BNDG ELASTIC 6X5.8 VLCR STR LF (GAUZE/BANDAGES/DRESSINGS) IMPLANT
BNDG ESMARK 6X9 LF (GAUZE/BANDAGES/DRESSINGS) ×3
BRUSH SCRUB EZ PLAIN DRY (MISCELLANEOUS) ×6 IMPLANT
CANISTER WOUNDNEG PRESSURE 500 (CANNISTER) ×3 IMPLANT
CHLORAPREP W/TINT 26 (MISCELLANEOUS) ×3 IMPLANT
COVER SURGICAL LIGHT HANDLE (MISCELLANEOUS) ×3 IMPLANT
COVER WAND RF STERILE (DRAPES) ×3 IMPLANT
DRAPE C-ARM 42X72 X-RAY (DRAPES) ×3 IMPLANT
DRAPE C-ARMOR (DRAPES) ×3 IMPLANT
DRAPE ORTHO SPLIT 77X108 STRL (DRAPES) ×4
DRAPE SURG ORHT 6 SPLT 77X108 (DRAPES) ×2 IMPLANT
DRAPE U-SHAPE 47X51 STRL (DRAPES) ×3 IMPLANT
DRILL BIT CANN 3.2MM (BIT) ×2
DRILL BIT QUICK COUP 2.8MM 100 (BIT) ×2
DRILL BIT X LONG 2.5 (BIT) ×2
DRSG ADAPTIC 3X8 NADH LF (GAUZE/BANDAGES/DRESSINGS) IMPLANT
DRSG EMULSION OIL 3X3 NADH (GAUZE/BANDAGES/DRESSINGS) ×3 IMPLANT
ELECT REM PT RETURN 9FT ADLT (ELECTROSURGICAL) ×3
ELECTRODE REM PT RTRN 9FT ADLT (ELECTROSURGICAL) ×1 IMPLANT
GAUZE SPONGE 4X4 12PLY STRL (GAUZE/BANDAGES/DRESSINGS) IMPLANT
GLOVE BIO SURGEON STRL SZ 6.5 (GLOVE) ×6 IMPLANT
GLOVE BIO SURGEON STRL SZ7.5 (GLOVE) ×9 IMPLANT
GLOVE BIO SURGEONS STRL SZ 6.5 (GLOVE) ×3
GLOVE BIOGEL PI IND STRL 6.5 (GLOVE) ×1 IMPLANT
GLOVE BIOGEL PI IND STRL 7.5 (GLOVE) ×1 IMPLANT
GLOVE BIOGEL PI INDICATOR 6.5 (GLOVE) ×2
GLOVE BIOGEL PI INDICATOR 7.5 (GLOVE) ×2
GOWN STRL REUS W/ TWL LRG LVL3 (GOWN DISPOSABLE) ×2 IMPLANT
GOWN STRL REUS W/TWL LRG LVL3 (GOWN DISPOSABLE) ×4
KIT TURNOVER KIT B (KITS) ×3 IMPLANT
MANIFOLD NEPTUNE II (INSTRUMENTS) ×3 IMPLANT
NEEDLE HYPO 21X1.5 SAFETY (NEEDLE) IMPLANT
NEEDLE HYPO 25GX1X1/2 BEV (NEEDLE) ×3 IMPLANT
NS IRRIG 1000ML POUR BTL (IV SOLUTION) ×3 IMPLANT
PACK TOTAL JOINT (CUSTOM PROCEDURE TRAY) ×3 IMPLANT
PAD ARMBOARD 7.5X6 YLW CONV (MISCELLANEOUS) ×6 IMPLANT
PAD CAST 4YDX4 CTTN HI CHSV (CAST SUPPLIES) IMPLANT
PADDING CAST COTTON 4X4 STRL (CAST SUPPLIES)
PADDING CAST COTTON 6X4 STRL (CAST SUPPLIES) IMPLANT
PLATE LCP RECON 3.5 7H/98 (Plate) ×3 IMPLANT
SCREW CANN 38MM FULLY (Screw) ×6 IMPLANT
SCREW CORT HEADED ST 3.5X14 (Screw) ×6 IMPLANT
SCREW HEADED ST 3.5X12 (Screw) ×6 IMPLANT
SCREW HEADED ST 3.5X18 (Screw) ×3 IMPLANT
SCREW HEADED ST 3.5X46 (Screw) ×3 IMPLANT
SCREW HEADED ST 3.5X65 (Screw) ×6 IMPLANT
SCREW LOCK T15 FT 20X3.5XST (Screw) ×2 IMPLANT
SCREW LOCKING 3.5X20 (Screw) ×4 IMPLANT
SPONGE LAP 18X18 RF (DISPOSABLE) IMPLANT
STAPLER VISISTAT 35W (STAPLE) ×3 IMPLANT
SUCTION FRAZIER HANDLE 10FR (MISCELLANEOUS) ×2
SUCTION TUBE FRAZIER 10FR DISP (MISCELLANEOUS) ×1 IMPLANT
SUT ETHILON 3 0 PS 1 (SUTURE) ×9 IMPLANT
SUT PROLENE 0 CT (SUTURE) IMPLANT
SUT VIC AB 0 CT1 27 (SUTURE) ×2
SUT VIC AB 0 CT1 27XBRD ANBCTR (SUTURE) ×1 IMPLANT
SUT VIC AB 2-0 CT1 27 (SUTURE) ×2
SUT VIC AB 2-0 CT1 TAPERPNT 27 (SUTURE) ×1 IMPLANT
SYR CONTROL 10ML LL (SYRINGE) ×3 IMPLANT
TOWEL GREEN STERILE (TOWEL DISPOSABLE) ×6 IMPLANT
TOWEL GREEN STERILE FF (TOWEL DISPOSABLE) ×3 IMPLANT
UNDERPAD 30X30 (UNDERPADS AND DIAPERS) ×3 IMPLANT
WATER STERILE IRR 1000ML POUR (IV SOLUTION) ×3 IMPLANT
WIRE G 1.6X150 SPADE (MISCELLANEOUS) ×6 IMPLANT

## 2019-07-26 NOTE — TOC Initial Note (Addendum)
Transition of Care (TOC) - Initial/Assessment Note    Patient Details  Name: Deanna Landry MRN: [id number] Date of Birth: 11/02/1971  Transition of Care (TOC) CM/SW Contact:     H , LCSWA Phone Number: 07/26/2019, 10:55 AM  Clinical Narrative:                 Pt returned to room; CSW met with her at bedside, introduced self, role, reason for visit. Pt from home with her mother in [place]. She is legally blind and uses a cane daily to get around her home and community. She is originally from [place] and moved up to [place] for "better support." She is having surgery today and understands that post surgery therapy teams will come work with her to assess what she needs. At this time she is unclear what type of assist she will need after surgery.   CSW will follow. Pt utilizes 3L oxygen at home and does not know what company it is through (upon chart review it is through [employer]).   Expected Discharge Plan: Home w Home Health Services Barriers to Discharge: Continued Medical Work up, Insurance Authorization   Patient Goals and CMS Choice Patient states their goals for this hospitalization and ongoing recovery are:: to have some assistance at discharge with therapies CMS Medicare.gov Compare Post Acute Care list provided to:: (n/a at this time) Choice offered to / list presented to : Patient  Expected Discharge Plan and Services Expected Discharge Plan: Home w Home Health Services In-house Referral: Clinical Social Work Discharge Planning Services: CM Consult Post Acute Care Choice: NA(unsure at this time) Living arrangements for the past 2 months: Single Family Home  Prior Living Arrangements/Services Living arrangements for the past 2 months: Single Family Home Lives with:: Parents Patient language and need for interpreter reviewed:: Yes(no needs) Do you feel safe going back to the place where you live?: Yes      Need for Family Participation in Patient Care: Yes  (Comment)(assistance and supervision) Care giver support system in place?: Yes (comment)(mother) Current home services: DME("blind cane" white cane with red tip) Criminal Activity/Legal Involvement Pertinent to Current Situation/Hospitalization: No - Comment as needed  Activities of Daily Living      Permission Sought/Granted Permission sought to share information with : Family Supports Permission granted to share information with : Yes, Verbal Permission Granted  Share Information with NAME: [name]     Permission granted to share info w Relationship: mother  Permission granted to share info w Contact Information: [phone]  Emotional Assessment Appearance:: Appears stated age Attitude/Demeanor/Rapport: Gracious, Engaged Affect (typically observed): Adaptable, Accepting, Appropriate Orientation: : Oriented to Self, Oriented to Place, Oriented to  Time, Oriented to Situation Alcohol / Substance Use: Not Applicable Psych Involvement: No (comment)  Admission diagnosis:  TIBIA FRACTURE FIBIA FRACTURE Patient Active Problem List   Diagnosis Date Noted  . Closed right ankle fracture, initial encounter 07/26/2019  . Diabetes mellitus type 2 in obese (HCC) 07/26/2019  . Hypertension 07/26/2019  . Pneumonia 03/15/2019  . Type 1 DM with CKD and hypertension (HCC) 03/15/2019  . Acute kidney injury superimposed on CKD (HCC) 03/15/2019  . Suicidal ideation 03/15/2019  . Acute respiratory failure with hypoxia (HCC) 03/15/2019  . Adjustment disorder with mixed anxiety and depressed mood 03/11/2019  . Fever 03/10/2019  . Sepsis (HCC) 03/10/2019  . MDD (major depressive disorder), severe (HCC) 03/09/2019  . Major depression, recurrent (HCC) 03/09/2019  . Diabetic polyneuropathy (HCC) 05/04/2016  . Neck pain   04/21/2016   PCP:  Lee, Keung, MD Pharmacy:   [id number] [place] [place] - [address] [place] [address] Phone: [phone] Fax:  [phone]     Social Determinants of Health (SDOH) Interventions    Readmission Risk Interventions No flowsheet data found.  

## 2019-07-26 NOTE — Progress Notes (Signed)
Orthopedic Tech Progress Note Patient Details:  Deanna Landry [birth year] [id number] Applied Over Head Frame with Trapeze for patient and dropped off CAM WALKER earlier while patient was in the OR. Ortho Devices Type of Ortho Device: CAM walker Ortho Device/Splint Interventions: Other (comment)   Post Interventions Patient Tolerated: Other (comment) Instructions Provided: Other (comment)   [name] 07/26/2019, 5:33 PM

## 2019-07-26 NOTE — Social Work (Signed)
Attempted to visit with pt at bedside, pt being transported for procedure. Will revisit when pt back in room.  Deanna Landry, MSW, [place] Work [phone]

## 2019-07-26 NOTE — Transfer of Care (Signed)
Immediate Anesthesia Transfer of Care Note  Patient: Deanna Landry  Procedure(s) Performed: OPEN REDUCTION INTERNAL FIXATION (ORIF) ANKLE FRACTURE (Right Ankle)  Patient Location: PACU  Anesthesia Type:GA combined with regional for post-op pain  Level of Consciousness: awake, alert , oriented and patient cooperative  Airway & Oxygen Therapy: Patient Spontanous Breathing and Patient connected to face mask oxygen  Post-op Assessment: Report given to RN and Post -op Vital signs reviewed and stable  Post vital signs: Reviewed and stable  Last Vitals:  Vitals Value Taken Time  BP 102/47 07/26/19 1536  Temp    Pulse 73 07/26/19 1540  Resp 17 07/26/19 1540  SpO2 95 % 07/26/19 1540  Vitals shown include unvalidated device data.  Last Pain:  Vitals:   07/26/19 1110  TempSrc: Oral  PainSc:       Patients Stated Pain Goal: 2 ([id number])  Complications: No apparent anesthesia complications

## 2019-07-26 NOTE — Anesthesia Procedure Notes (Signed)
Anesthesia Regional Block: Popliteal block   Pre-Anesthetic Checklist: ,, timeout performed, Correct Patient, Correct Site, Correct Laterality, Correct Procedure, Correct Position, site marked, Risks and benefits discussed, pre-op evaluation,  At surgeon's request and post-op pain management  Laterality: Right  Prep: Maximum Sterile Barrier Precautions used, chloraprep       Needles:  Injection technique: Single-shot  Needle Type: Echogenic Needle     Needle Length: 9cm  Needle Gauge: 21     Additional Needles:   Procedures:,,,, ultrasound used (permanent image in chart),,,,  Narrative:  Start time: 07/26/2019 12:27 PM End time: 07/26/2019 12:36 PM Injection made incrementally with aspirations every 5 mL.  Performed by: Personally  Anesthesiologist: Barnet Glasgow, MD  Additional Notes: Block assessed. Patient tolerated procedure well.

## 2019-07-26 NOTE — H&P (Signed)
History and Physical    Shayma D Shaft [id number] DOB: [birth year] DOA: 07/25/2019  PCP: Cher Nakai, MD  Patient coming from: Patient was transferred from [facility].  Chief Complaint: Right ankle fracture.  HPI: Deanna Landry is a 48 y.o. female with history of diabetes mellitus type 2, hypertension, hypothyroidism, chronic kidney disease stage III, history of stroke history of legal blindness had a fall at home while patient was in the kitchen at around 4 AM yesterday.  Patient states he suddenly stumbled and fell but did not hit her head or lose consciousness.  Patient was feeling diaphoretic and weak just like hypoglycemic episode.  Since patient had persistent pain in the right ankle since the fall patient decided to come to the ER later in the evening.  X-rays revealed right trimalleolar fracture of the ankle and orthopedic surgery was not comfortable at [facility] and transferred to [facility].  Dr. Stann Mainland will be the accepting physician for orthopedics at [facility].  On my exam patient denies any chest pain or shortness of breath palpitations nausea vomiting or diarrhea.  Patient does not recall her last blood sugar.  Labs reviewed at [facility].  ED Course: Patient was a direct admit.  Review of Systems: As per HPI, rest all negative.   Past Medical History:  Diagnosis Date  . CHF (congestive heart failure) ([place])   . Diabetes mellitus without complication ([place])   . Hypertension   . Myocardial infarct, old   . Renal disorder   . Stroke [facility])     Past Surgical History:  Procedure Laterality Date  . ABDOMINAL HYSTERECTOMY    . EYE SURGERY    . TOE AMPUTATION       reports that she has never smoked. She has never used smokeless tobacco. She reports previous alcohol use. She reports previous drug use.  No Known Allergies  Family History  Problem Relation Age of Onset  . Diabetes Mellitus II Mother   . Stroke Mother   . Stomach cancer Father      Prior to Admission medications   Medication Sig Start Date End Date Taking? Authorizing Provider  ACCU-CHEK AVIVA PLUS test strip USE TO CHECK BLOOD SUGAR 4 TIMES A DAY 04/07/19   [provider]  amLODipine (NORVASC) 10 MG tablet Take 10 mg by mouth daily. 01/15/19   [provider]  aspirin EC 81 MG tablet Take 81 mg by mouth daily.    [provider]  atorvastatin (LIPITOR) 80 MG tablet Take 80 mg by mouth daily. 12/28/18   [provider]  atropine 1 % ophthalmic solution Place 1 drop into the right eye 2 (two) times daily. 01/15/19   [provider]  buPROPion (WELLBUTRIN XL) 150 MG 24 hr tablet Take 150 mg by mouth daily. 03/02/19   [provider]  clopidogrel (PLAVIX) 75 MG tablet Take 75 mg by mouth daily. 01/15/19   [provider]  [employer] ZINC OXIDE 20 % ointment APPLY TOPICALLY AS NEEDED FOR IRRITATION. 03/16/19   [provider]  dorzolamide-timolol (COSOPT) 22.3-6.8 MG/ML ophthalmic solution Place 1 drop into the left eye 2 (two) times daily. 03/06/19   [provider]  isosorbide dinitrate (ISORDIL) 20 MG tablet Take 20 mg by mouth 2 (two) times daily. 01/27/19   [provider]  latanoprost (XALATAN) 0.005 % ophthalmic solution Place 1 drop into both eyes every evening. 01/23/19   [provider]  LEVEMIR 100 UNIT/ML injection Inject 30  Units into the skin daily. QAM 12/07/18   [provider]  levothyroxine (SYNTHROID) 50 MCG tablet Take 50 mcg by mouth daily. 02/02/19   [provider]  liver oil-zinc oxide (DESITIN) 40 % ointment Apply topically as needed for irritation. 03/16/19   Desiree Hane, MD  metoCLOPramide (REGLAN) 5 MG tablet Take 1 tablet (5 mg total) by mouth 3 (three) times daily before meals. 03/16/19   Desiree Hane, MD  NOVOLOG FLEXPEN 100 UNIT/ML FlexPen Inject 10 Units into the skin 3 (three) times daily. 01/23/19   [provider]  ondansetron (ZOFRAN)  4 MG tablet Take 1 tablet (4 mg total) by mouth every 6 (six) hours as needed for nausea. 03/16/19   Desiree Hane, MD  pantoprazole (PROTONIX) 40 MG tablet Take 40 mg by mouth daily. 12/29/18   [provider]  prednisoLONE acetate (PRED FORTE) 1 % ophthalmic suspension Place 1 drop into the left eye 2 (two) times daily. 12/15/18   [provider]  pregabalin (LYRICA) 75 MG capsule Take 75 mg by mouth 2 (two) times daily. 02/11/19   [provider]  sertraline (ZOLOFT) 100 MG tablet Take 100 mg by mouth daily. 01/27/19   [provider]  TRULICITY 1.5 0000000 SOPN Inject 1.5 mg into the skin once a week. On Mondays 02/07/19   [provider]    Physical Exam: Constitutional: Moderately built and nourished. Vitals:   07/26/19 0117  BP: 116/61  Pulse: 72  Resp: 18  Temp: 98.3 F (36.8 C)  TempSrc: Oral  SpO2: 99%   Eyes: Anicteric no pallor. ENMT: No discharge from the ears eyes nose or mouth. Neck: No mass felt.  No neck rigidity. Respiratory: No rhonchi or crepitations. Cardiovascular: S1-S2 heard. Abdomen: Soft nontender bowel sounds present. Musculoskeletal: Right leg is in the brace. Skin: No rash. Neurologic: Alert awake oriented to time place and person.  Moves all extremities. Psychiatric: Appears normal.   Labs on Admission: I have personally reviewed following labs and imaging studies  CBC: Recent Labs  Lab 07/26/19 0136  WBC 5.0  NEUTROABS 3.4  HGB 11.6*  HCT 36.5  MCV 86.7  PLT [id number]   Basic Metabolic Panel: No results for input(s): NA, K, CL, CO2, GLUCOSE, BUN, CREATININE, CALCIUM, MG, PHOS in the last 168 hours. GFR: CrCl cannot be calculated (Patient's most recent lab result is older than the maximum 21 days allowed.). Liver Function Tests: No results for input(s): AST, ALT, ALKPHOS, BILITOT, PROT, ALBUMIN in the last 168 hours. No results for input(s): LIPASE, AMYLASE in the last 168 hours. No results for  input(s): AMMONIA in the last 168 hours. Coagulation Profile: No results for input(s): INR, PROTIME in the last 168 hours. Cardiac Enzymes: No results for input(s): CKTOTAL, CKMB, CKMBINDEX, TROPONINI in the last 168 hours. BNP (last 3 results) No results for input(s): PROBNP in the last 8760 hours. HbA1C: No results for input(s): HGBA1C in the last 72 hours. CBG: No results for input(s): GLUCAP in the last 168 hours. Lipid Profile: No results for input(s): CHOL, HDL, LDLCALC, TRIG, CHOLHDL, LDLDIRECT in the last 72 hours. Thyroid Function Tests: No results for input(s): TSH, T4TOTAL, FREET4, T3FREE, THYROIDAB in the last 72 hours. Anemia Panel: No results for input(s): VITAMINB12, FOLATE, FERRITIN, TIBC, IRON, RETICCTPCT in the last 72 hours. Urine analysis:    Component Value Date/Time   COLORURINE YELLOW 03/10/2019 0745   APPEARANCEUR CLEAR 03/10/2019 0745   LABSPEC 1.010 03/10/2019 0745  PHURINE 5.0 03/10/2019 0745   GLUCOSEU >=500 (A) 03/10/2019 0745   HGBUR NEGATIVE 03/10/2019 0745   BILIRUBINUR NEGATIVE 03/10/2019 0745   KETONESUR NEGATIVE 03/10/2019 0745   PROTEINUR NEGATIVE 03/10/2019 0745   NITRITE NEGATIVE 03/10/2019 0745   LEUKOCYTESUR TRACE (A) 03/10/2019 0745   Sepsis Labs: @LABRCNTIP (procalcitonin:4,lacticidven:4) ) Recent Results (from the past 240 hour(s))  Surgical pcr screen     Status: None   Collection Time: 07/26/19 12:42 AM   Specimen: Nasal Mucosa; Nasal Swab  Result Value Ref Range Status   MRSA, PCR NEGATIVE NEGATIVE Final   Staphylococcus aureus NEGATIVE NEGATIVE Final    Comment: (NOTE) The Xpert SA Assay (FDA approved for NASAL specimens in patients 35 years of age and older), is one component of a comprehensive surveillance program. It is not intended to diagnose infection nor to guide or monitor treatment. Performed at [facility], [place] [address] [place], [place] [address]      Radiological Exams on Admission: No results  found.  EKG: Independently reviewed.  Normal sinus rhythm.  Assessment/Plan Principal Problem:   Closed right ankle fracture, initial encounter Active Problems:   Diabetes mellitus type 2 in obese (HCC)   Hypertension    1. Right ankle fracture trimalleolar -Dr. Stann Mainland orthopedic surgeon on call to see patient.  Patient will be at moderate risk for intermediate risk procedure would keep patient n.p.o except medications. 2. Diabetes mellitus type 2 we will keep patient on sliding scale coverage for now.  Closely follow CBGs.  Patient takes Levemir usually 30 units.  Patient thinks he may have had a hypoglycemic episode. 3. Hypertension on amlodipine and Isordil. 4. History of stroke on statins and antiplatelet agent.  Antiplatelet agent on hold in anticipation of surgery. 5. Chronic disease stage III creatinine appears to be at baseline around 2. 6. Anemia follow CBC. 7. Legally blind.  Since patient will need close monitoring and surgery will need inpatient status.   DVT prophylaxis: SCDs in anticipation of surgery. Code Status: Full code. Family Communication: Discussed with patient. Disposition Plan: Probably will need rehab. Consults called: Orthopedics. Admission status: Inpatient   Rise Patience MD [facility] Pager [phone].  If 7PM-7AM, please contact night-coverage [email or web address] Password [email or web address]  07/26/2019, 3:21 AM

## 2019-07-26 NOTE — Consult Note (Signed)
Reason for Consult:Right ankle fx Referring Physician: Reola Mosher  Deanna Landry is an 48 y.o. female.  HPI: Deanna Landry felt like her blood sugar was getting low and went to the kitchen to get something to eat. While there she felt like her knees gave way and she fell to the floor. When she had recovered she tried to get up but noticed significant right ankle pain and she could not bear weight. She came to the ED at [facility] for evaluation where x-rays showed an ankle fx. She was transferred to [facility] and orthopedic surgery was consulted. She c/o poorly controlled localized ankle pain.  Past Medical History:  Diagnosis Date  . CHF (congestive heart failure) ([place])   . Diabetes mellitus without complication ([place])   . Hypertension   . Myocardial infarct, old   . Renal disorder   . Stroke [facility])     Past Surgical History:  Procedure Laterality Date  . ABDOMINAL HYSTERECTOMY    . EYE SURGERY    . TOE AMPUTATION      Family History  Problem Relation Age of Onset  . Diabetes Mellitus II Mother   . Stroke Mother   . Stomach cancer Father     Social History:  reports that she has never smoked. She has never used smokeless tobacco. She reports previous alcohol use. She reports previous drug use.  Allergies: No Known Allergies  Medications: I have reviewed the patient's current medications.  Results for orders placed or performed during the hospital encounter of 07/25/19 (from the past 48 hour(s))  SARS CORONAVIRUS 2 (TAT 6-24 HRS) Nasopharyngeal Nasopharyngeal Swab     Status: None   Collection Time: 07/26/19 12:17 AM   Specimen: Nasopharyngeal Swab  Result Value Ref Range   SARS Coronavirus 2 NEGATIVE NEGATIVE    Comment: (NOTE) SARS-CoV-2 target nucleic acids are NOT DETECTED. The SARS-CoV-2 RNA is generally detectable in upper and lower respiratory specimens during the acute phase of infection. Negative results do not preclude SARS-CoV-2 infection, do not rule out co-infections with  other pathogens, and should not be used as the sole basis for treatment or other patient management decisions. Negative results must be combined with clinical observations, patient history, and epidemiological information. The expected result is Negative. Fact Sheet for Patients: SugarRoll.be Fact Sheet for Healthcare Providers: https://www.woods-mathews.com/ This test is not yet approved or cleared by the [place] FDA and  has been authorized for detection and/or diagnosis of SARS-CoV-2 by FDA under an Emergency Use Authorization (EUA). This EUA will remain  in effect (meaning this test can be used) for the duration of the COVID-19 declaration under Section 56 4(b)(1) of the Act, 21 U.S.C. section 360bbb-3(b)(1), unless the authorization is terminated or revoked sooner. Performed at [facility], [place] [address] [place], [place] [address]   Surgical pcr screen     Status: None   Collection Time: 07/26/19 12:42 AM   Specimen: Nasal Mucosa; Nasal Swab  Result Value Ref Range   MRSA, PCR NEGATIVE NEGATIVE   Staphylococcus aureus NEGATIVE NEGATIVE    Comment: (NOTE) The Xpert SA Assay (FDA approved for NASAL specimens in patients 54 years of age and older), is one component of a comprehensive surveillance program. It is not intended to diagnose infection nor to guide or monitor treatment. Performed at [facility], [place] [address], [place], [place] [address]   Comprehensive metabolic panel     Status: Abnormal   Collection Time: 07/26/19  1:36 AM  Result Value Ref Range   Sodium 138 135 - 145 mmol/L   Potassium 5.2 (H) 3.5 - 5.1 mmol/L   Chloride 105 98 - 111 mmol/L   CO2 24 22 - 32 mmol/L   Glucose, Bld 190 (H) 70 - 99 mg/dL   BUN 28 (H) 6 - 20 mg/dL   Creatinine, Ser 2.29 (H) 0.44 - 1.00 mg/dL   Calcium 9.1 8.9 - 10.3 mg/dL   Total Protein 7.0 6.5 - 8.1 g/dL   Albumin 3.5 3.5 - 5.0 g/dL   AST 17 15 - 41 U/L   ALT  14 0 - 44 U/L   Alkaline Phosphatase 112 38 - 126 U/L   Total Bilirubin 0.4 0.3 - 1.2 mg/dL   GFR calc non Af Amer 25 (L) >60 mL/min   GFR calc Af Amer 29 (L) >60 mL/min   Anion gap 9 5 - 15    Comment: Performed at [place] [address] [place], [place] [address]  CBC with Differential/Platelet     Status: Abnormal   Collection Time: 07/26/19  1:36 AM  Result Value Ref Range   WBC 5.0 4.0 - 10.5 K/uL   RBC 4.21 3.87 - 5.11 MIL/uL   Hemoglobin 11.6 (L) 12.0 - 15.0 g/dL   HCT 36.5 36.0 - 46.0 %   MCV 86.7 80.0 - 100.0 fL   MCH 27.6 26.0 - 34.0 pg   MCHC 31.8 30.0 - 36.0 g/dL   RDW 14.7 11.5 - 15.5 %   Platelets 220 150 - 400 K/uL   nRBC 0.0 0.0 - 0.2 %   Neutrophils Relative % 69 %   Neutro Abs 3.4 1.7 - 7.7 K/uL   Lymphocytes Relative 22 %   Lymphs Abs 1.1 0.7 - 4.0 K/uL   Monocytes Relative 8 %   Monocytes Absolute 0.4 0.1 - 1.0 K/uL   Eosinophils Relative 1 %   Eosinophils Absolute 0.0 0.0 - 0.5 K/uL   Basophils Relative 0 %   Basophils Absolute 0.0 0.0 - 0.1 K/uL   Immature Granulocytes 0 %   Abs Immature Granulocytes 0.01 0.00 - 0.07 K/uL    Comment: Performed at [place] [address], [place], [place] [address]  Type and screen [place]     Status: None   Collection Time: 07/26/19  1:40 AM  Result Value Ref Range   ABO/RH(D) O POS    Antibody Screen NEG    Sample Expiration      07/29/2019,2359 Performed at [facility], [place] [address] [place], [place] [address]   ABO/Rh     Status: None   Collection Time: 07/26/19  1:40 AM  Result Value Ref Range   ABO/RH(D)      O POS Performed at [place] [address] [place],  [address]   Glucose, capillary     Status: Abnormal   Collection Time: 07/26/19  3:37 AM  Result Value Ref Range   Glucose-Capillary 184 (H) 70 - 99 mg/dL  Glucose, capillary     Status: Abnormal   Collection Time: 07/26/19  8:01 AM  Result Value Ref Range    Glucose-Capillary 103 (H) 70 - 99 mg/dL    Chest Portable 1 View  Result Date: 07/26/2019 CLINICAL DATA:  Preop for right foot surgery EXAM: PORTABLE CHEST 1 VIEW COMPARISON:  03/12/2019 FINDINGS: Low volume chest with streaky density at both lung bases-seen to some degree on prior. No  acute airspace disease. No pleural fluid or pneumothorax. Borderline heart size accentuated by low volumes. IMPRESSION: Low volume chest with atelectasis or scarring in the lower lobes. Electronically Signed   By: Monte Fantasia M.D.   On: 07/26/2019 07:05    Review of Systems  Constitutional: Negative for weight loss.  HENT: Negative for ear discharge, ear pain, hearing loss and tinnitus.   Eyes: Negative for blurred vision, double vision, photophobia and pain.  Respiratory: Negative for cough, sputum production and shortness of breath.   Cardiovascular: Negative for chest pain.  Gastrointestinal: Negative for abdominal pain, nausea and vomiting.  Genitourinary: Negative for dysuria, flank pain, frequency and urgency.  Musculoskeletal: Positive for joint pain (Right ankle). Negative for back pain, falls, myalgias and neck pain.  Neurological: Negative for dizziness, tingling, sensory change, focal weakness, loss of consciousness and headaches.  Endo/Heme/Allergies: Does not bruise/bleed easily.  Psychiatric/Behavioral: Negative for depression, memory loss and substance abuse. The patient is not nervous/anxious.    Blood pressure (!) 110/55, pulse 68, temperature 97.9 F (36.6 C), temperature source Oral, resp. rate 18, SpO2 99 %. Physical Exam  Constitutional: She appears well-developed and well-nourished. No distress.  HENT:  Head: Normocephalic and atraumatic.  Eyes: Conjunctivae are normal. Right eye exhibits no discharge. Left eye exhibits no discharge. No scleral icterus.  Neck: Normal range of motion.  Cardiovascular: Normal rate and regular rhythm.  Respiratory: Effort normal. No respiratory  distress.  Musculoskeletal:     Comments: RLE No traumatic wounds, ecchymosis, or rash  Short leg splint in place  No knee or ankle effusion  Knee stable to varus/ valgus and anterior/posterior stress  Sens DPN, SPN, TN grossly paresthetic  Motor EHL, ext, flex, evers grossly intact  Neurological: She is alert.  Skin: Skin is warm and dry. She is not diaphoretic.  Psychiatric: She has a normal mood and affect. Her behavior is normal.    Assessment/Plan: Right ankle fx -- Plan ex fix vs ORIF today by Dr. Doreatha Martin. Please keep NPO until then. Multiple medical problems including DM, CHF, CKD, hypothyroidism, and blindness -- per primary service    Lisette Abu, PA-C Orthopedic Surgery [phone] 07/26/2019, 8:52 AM

## 2019-07-26 NOTE — Progress Notes (Signed)
Progress Note   Karmina D Dye [id number] DOB: 02/21/1971 DOA: 07/25/2019  PCP: Cher Nakai, MD  Patient coming from: Patient was transferred from [facility].  Chief Complaint: Right ankle fracture.  HPI: Deanna Landry is a 48 y.o. female with history of diabetes mellitus type 2, hypertension, hypothyroidism, chronic kidney disease stage III, history of stroke history of legal blindness had a fall at home while patient was in the kitchen at around 4 AM yesterday.  Patient states he suddenly stumbled and fell but did not hit her head or lose consciousness.  Patient was feeling diaphoretic and weak just like hypoglycemic episode.  Since patient had persistent pain in the right ankle since the fall patient decided to come to the ER later in the evening.  X-rays revealed right trimalleolar fracture of the ankle and orthopedic surgery was not comfortable at [facility] and transferred to [facility].  Dr. Stann Mainland will be the accepting physician for orthopedics at [facility].  On my exam patient denies any chest pain or shortness of breath palpitations nausea vomiting or diarrhea.  Patient does not recall her last blood sugar.  Labs reviewed at [facility].   Subjective: No acute issues or events overnight, patient continues to complain of poorly controlled pain, stating her pain is 10 out of 10 despite morphine.  Otherwise complains that she is hungry as she is n.p.o. for surgery.  Otherwise declines chest pain, shortness of breath, nausea, vomiting, diarrhea, constipation, headache, fever, chills.  Assessment/Plan Principal Problem:   Closed right ankle fracture, initial encounter Active Problems:   Diabetes mellitus type 2 in obese (HCC)   Hypertension   Acute closed right ankle fracture trimalleolar  -Orthopedic surgery following, tentative external versus internal fixation pending OR evaluation -Pain control/DVT prophylaxis per orthopedic surgery -Patient will be at moderate  risk for intermediate risk procedure  -N.p.o. for surgery, resume diet postoperatively  Diabetes mellitus type 2  -Continue sliding scale coverage, hypoglycemic protocol  -Likely to resume patient's home Levemir -typically 30 units at bedtime  Hypertension, essential -Continue amlodipine and Isordil.  CVA history  -Continue atorvastatin and Plavix postoperatively if okay with orthopedics/pending DVT prophylaxis  Chronic disease stage III  -baseline around 2, creatinine currently 2.3 -Follow with morning labs  Normocytic anemia, likely anemia of chronic disease given above Baseline around 10  Follow a.m. labs  DVT prophylaxis: SCDs in anticipation of surgery Code Status: Full code. Disposition Plan: Pending surgical evaluation, disposition likely to rehab pending PT evaluation commendations Consults called: Orthopedics. Admission status: Inpatient pending surgical intervention/evaluation  Physical Exam: Constitutional: Moderately built and nourished. Vitals:   07/26/19 0117 07/26/19 0339 07/26/19 1110 07/26/19 1128  BP: 116/61 (!) 110/55 (!) 120/58   Pulse: 72 68 75   Resp: 18  17   Temp: 98.3 F (36.8 C) 97.9 F (36.6 C) 98 F (36.7 C)   TempSrc: Oral Oral Oral   SpO2: 99% 99% 100%   Weight:    84.4 kg  Height:    5\' 2"  (1.575 m)   ENMT: No discharge from the ears eyes nose or mouth. Neck: No mass felt.  No neck rigidity. Respiratory: No rhonchi or crepitations. Cardiovascular: S1-S2 heard. Abdomen: Soft nontender bowel sounds present. Musculoskeletal: Right leg is in the brace. Skin: No rash. Neurologic: Alert awake oriented to time place and person.  Moves all extremities.  Labs on Admission: I have personally reviewed following labs and imaging studies  CBC: Recent Labs  Lab 07/26/19 0136  WBC 5.0  NEUTROABS 3.4  HGB 11.6*  HCT 36.5  MCV 86.7  PLT [id number]   Basic Metabolic Panel: Recent Labs  Lab 07/26/19 0136  NA 138  K 5.2*  CL 105  CO2 24   GLUCOSE 190*  BUN 28*  CREATININE 2.29*  CALCIUM 9.1   GFR: Estimated Creatinine Clearance: 30.6 mL/min (A) (by C-G formula based on SCr of 2.29 mg/dL (H)). Liver Function Tests: Recent Labs  Lab 07/26/19 0136  AST 17  ALT 14  ALKPHOS 112  BILITOT 0.4  PROT 7.0  ALBUMIN 3.5   CBG: Recent Labs  Lab 07/26/19 0337 07/26/19 0801 07/26/19 1109  GLUCAP 184* 103* 118*   Urine analysis:    Component Value Date/Time   COLORURINE YELLOW 03/10/2019 0745   APPEARANCEUR CLEAR 03/10/2019 0745   LABSPEC 1.010 03/10/2019 0745   PHURINE 5.0 03/10/2019 0745   GLUCOSEU >=500 (A) 03/10/2019 0745   HGBUR NEGATIVE 03/10/2019 0745   BILIRUBINUR NEGATIVE 03/10/2019 0745   KETONESUR NEGATIVE 03/10/2019 0745   PROTEINUR NEGATIVE 03/10/2019 0745   NITRITE NEGATIVE 03/10/2019 0745   LEUKOCYTESUR TRACE (A) 03/10/2019 0745    Recent Results (from the past 240 hour(s))  SARS CORONAVIRUS 2 (TAT 6-24 HRS) Nasopharyngeal Nasopharyngeal Swab     Status: None   Collection Time: 07/26/19 12:17 AM   Specimen: Nasopharyngeal Swab  Result Value Ref Range Status   SARS Coronavirus 2 NEGATIVE NEGATIVE Final    Comment: (NOTE) SARS-CoV-2 target nucleic acids are NOT DETECTED. The SARS-CoV-2 RNA is generally detectable in upper and lower respiratory specimens during the acute phase of infection. Negative results do not preclude SARS-CoV-2 infection, do not rule out co-infections with other pathogens, and should not be used as the sole basis for treatment or other patient management decisions. Negative results must be combined with clinical observations, patient history, and epidemiological information. The expected result is Negative. Fact Sheet for Patients: SugarRoll.be Fact Sheet for Healthcare Providers: https://www.woods-mathews.com/ This test is not yet approved or cleared by the [place] FDA and  has been authorized for detection and/or  diagnosis of SARS-CoV-2 by FDA under an Emergency Use Authorization (EUA). This EUA will remain  in effect (meaning this test can be used) for the duration of the COVID-19 declaration under Section 56 4(b)(1) of the Act, 21 U.S.C. section 360bbb-3(b)(1), unless the authorization is terminated or revoked sooner. Performed at [facility], [place] [address], [place], [place] [address]   Surgical pcr screen     Status: None   Collection Time: 07/26/19 12:42 AM   Specimen: Nasal Mucosa; Nasal Swab  Result Value Ref Range Status   MRSA, PCR NEGATIVE NEGATIVE Final   Staphylococcus aureus NEGATIVE NEGATIVE Final    Comment: (NOTE) The Xpert SA Assay (FDA approved for NASAL specimens in patients 44 years of age and older), is one component of a comprehensive surveillance program. It is not intended to diagnose infection nor to guide or monitor treatment. Performed at [facility], [place] [address] [place], [place] [address]      Radiological Exams on Admission: Ct Ankle Right Wo Contrast  Result Date: 07/26/2019 CLINICAL DATA:  Status post fall, right ankle fracture EXAM: CT OF THE RIGHT ANKLE WITHOUT CONTRAST TECHNIQUE: Multidetector CT imaging of the right ankle was performed according to the standard protocol. Multiplanar CT image reconstructions were also generated. COMPARISON:  None. FINDINGS: Bones/Joint/Cartilage Comminuted fracture of the distal tibial metaphysis extending to the articular surface with the fracture  extending into the medial malleolus and involving the posterior malleolus. 6 mm of posterior displacement of the posterior malleolus. Narrowing of the medial tibiotalar joint. Comminuted fracture of the distal fibular diametaphysis with 9 mm of posterior displacement. No other acute fracture or dislocation. No aggressive osseous lesion. Small plantar calcaneal spur. Small ankle joint effusion. Ligaments Ligaments are suboptimally evaluated by CT. Muscles and Tendons  Muscles are normal. Flexor, extensor, peroneal and Achilles tendons are intact. Soft tissue No fluid collection or hematoma.  No soft tissue mass. IMPRESSION: 1. Trimalleolar right ankle fracture as detailed above. Electronically Signed   By: Kathreen Devoid   On: 07/26/2019 10:51   Chest Portable 1 View  Result Date: 07/26/2019 CLINICAL DATA:  Preop for right foot surgery EXAM: PORTABLE CHEST 1 VIEW COMPARISON:  03/12/2019 FINDINGS: Low volume chest with streaky density at both lung bases-seen to some degree on prior. No acute airspace disease. No pleural fluid or pneumothorax. Borderline heart size accentuated by low volumes. IMPRESSION: Low volume chest with atelectasis or scarring in the lower lobes. Electronically Signed   By: Monte Fantasia M.D.   On: 07/26/2019 07:05    EKG: Independently reviewed.  Normal sinus rhythm.   Little Ishikawa DO [facility]  Pager: [email or web address] Password [email or web address]  07/26/2019, 3:01 PM

## 2019-07-26 NOTE — Progress Notes (Signed)
Inpatient Diabetes Program Recommendations  AACE/ADA: New Consensus Statement on Inpatient Glycemic Control (2015)  Target Ranges:  Prepandial:   less than 140 mg/dL      Peak postprandial:   less than 180 mg/dL (1-2 hours)      Critically ill patients:  140 - 180 mg/dL   Lab Results  Component Value Date   GLUCAP 118 (H) 07/26/2019   HGBA1C 6.8 (H) 03/10/2019    Review of Glycemic Control Results for Deanna Landry, Deanna Landry (MRN [id number]) as of 07/26/2019 14:22  Ref. Range 07/26/2019 08:01 07/26/2019 11:09  Glucose-Capillary Latest Ref Range: 70 - 99 mg/dL 103 (H) 118 (H)   Diabetes history:Type 2 DM Outpatient Diabetes medications:Levemir 30 units Daily, Novolog 10 units tid, Trulicity 1.5 mg Q Monday Current orders for Inpatient glycemic control: Novolog 0-9 units Q4H Decadron 4 mg x 1 Inpatient Diabetes Program Recommendations:    Anticipate glucose trends to increase in setting of steroids. Consider adding back a portion of patient's basal insulin: Levemir 14 units QHS.   Thanks, Bronson Curb, MSN, RNC-OB Diabetes Coordinator [phone] (8a-5p)

## 2019-07-26 NOTE — Progress Notes (Signed)
Initial Nutrition Assessment  DOCUMENTATION CODES:   Obesity unspecified  INTERVENTION:   -MVI with minerals daily -RD will follow for diet advancement and supplement diet as appropriate  NUTRITION DIAGNOSIS:   Increased nutrient needs related to post-op healing as evidenced by estimated needs.  GOAL:   Patient will meet greater than or equal to 90% of their needs  MONITOR:   PO intake, Diet advancement, Weight trends, Skin, I & O's  REASON FOR ASSESSMENT:   Consult Assessment of nutrition requirement/status  ASSESSMENT:   Deanna Landry is a 48 y.o. female with history of diabetes mellitus type 2, hypertension, hypothyroidism, chronic kidney disease stage III, history of stroke history of legal blindness had a fall at home while patient was in the kitchen at around 4 AM yesterday.  Patient states he suddenly stumbled and fell but did not hit her head or lose consciousness.  Patient was feeling diaphoretic and weak just like hypoglycemic episode.  Since patient had persistent pain in the right ankle since the fall patient decided to come to the ER later in the evening.  X-rays revealed right trimalleolar fracture of the ankle and orthopedic surgery was not comfortable at [facility] and transferred to [facility].  Dr. Stann Mainland will be the accepting physician for orthopedics at [facility].  On my exam patient denies any chest pain or shortness of breath palpitations nausea vomiting or diarrhea.  Patient does not recall her last blood sugar.  Labs reviewed at [facility].  Pt admitted with closed rt ankle fracture.   Per orthopedics notes, plan for ex-fix vs ORIF today.   Pt down in OR at time of visit. Unable to obtain further nutrition-related history at this time.   Wt has been stable over the past 6 months.   Lab Results  Component Value Date   HGBA1C 6.8 (H) 03/10/2019   PTA DM medications are 30 units insulin detemir q AM and 10 units insulin aspart TID.    Labs reviewed: CBGS: 184 (inpatient orders for glycemic control are 0-9 units insulin aspart every 4 hours).   Diet Order:   Diet Order            Diet NPO time specified Except for: Sips with Meds  Diet effective now              EDUCATION NEEDS:   No education needs have been identified at this time  Skin:  Skin Assessment: Reviewed RN Assessment  Last BM:  Unknown  Height:   Ht Readings from Last 1 Encounters:  07/26/19 5\' 2"  (1.575 m)    Weight:   Wt Readings from Last 1 Encounters:  07/26/19 84.4 kg    Ideal Body Weight:  50 kg  BMI:  Body mass index is 34.02 kg/m.  Estimated Nutritional Needs:   Kcal:  1550-1750  Protein:  85-100 grams  Fluid:  > 1.5 L    Mikael Debell A. Jimmye Norman, RD, LDN, [place] Registered Dietitian II Certified Diabetes Care and Education Specialist Pager: [phone] After hours Pager: [phone]

## 2019-07-26 NOTE — Anesthesia Preprocedure Evaluation (Signed)
Anesthesia Evaluation  Patient identified by MRN, date of birth, ID band  Reviewed: Allergy & Precautions, NPO status , Patient's Chart, lab work & pertinent test results  Airway Mallampati: II  TM Distance: >3 FB Neck ROM: Full    Dental no notable dental hx. (+) Teeth Intact   Pulmonary neg pulmonary ROS,    Pulmonary exam normal breath sounds clear to auscultation       Cardiovascular hypertension, Pt. on medications + Past MI and +CHF  negative cardio ROS Normal cardiovascular exam Rhythm:Regular Rate:Normal     Neuro/Psych Depression CVA    GI/Hepatic negative GI ROS, Neg liver ROS,   Endo/Other  diabetes, Type 2  Renal/GU      Musculoskeletal   Abdominal   Peds  Hematology   Anesthesia Other Findings   Reproductive/Obstetrics                            Anesthesia Physical Anesthesia Plan  ASA: IV  Anesthesia Plan: General   Post-op Pain Management: GA combined w/ Regional for post-op pain   Induction:   PONV Risk Score and Plan: Treatment may vary due to age or medical condition, Ondansetron and Dexamethasone  Airway Management Planned: LMA  Additional Equipment:   Intra-op Plan:   Post-operative Plan:   Informed Consent: I have reviewed the patients History and Physical, chart, labs and discussed the procedure including the risks, benefits and alternatives for the proposed anesthesia with the patient or authorized representative who has indicated his/her understanding and acceptance.     Dental advisory given  Plan Discussed with:   Anesthesia Plan Comments: (GA w R pop block)        Anesthesia Quick Evaluation

## 2019-07-26 NOTE — Op Note (Signed)
Orthopaedic Surgery Operative Note (CSN: [id number] ) Date of Surgery: 07/26/2019  Admit Date: 07/25/2019   Diagnoses: Pre-Op Diagnoses: Right pilon fracture  Post-Op Diagnosis: Same  Procedures: CPT 27828-Open reduction internal fixation of right pilon fracture (fibula and tibia)  Surgeons : Primary: Shona Needles, MD  Assistant: Patrecia Pace, PA-C  Location: OR 6   Anesthesia:General with regional block   Antibiotics: Ancef 2g preop   Tourniquet time: None used    Estimated Blood [id number] mL  Complications:* No complications entered in OR log *   Specimens:* No specimens in log *   Implants: Implant Name Type Inv. Item Serial No. Manufacturer Lot No. LRB No. Used Action  SCREW CANN 38MM FULLY - CT:[id number] Screw SCREW CANN 38MM FULLY  [employer]  Right 2 Implanted  SCREW HEADED ST 3.5X65 - CT:[id number] Screw SCREW HEADED ST 3.5X65  [employer]  Right 2 Implanted  PLATE RECONSTRUCTION - CT:[id number] Plate PLATE RECONSTRUCTION  [employer]  Right 1 Implanted  SCREW HEADED ST 3.5X18 - CT:[id number] Screw SCREW HEADED ST 3.5X18  [employer]  Right 1 Implanted  SCREW HEADED ST 3.5X12 - CT:[id number] Screw SCREW HEADED ST 3.5X12  [employer]  Right 2 Implanted  SCREW CORT HEADED ST 3.5X14 - CT:[id number] Screw SCREW CORT HEADED ST 3.5X14  [employer]  Right 2 Implanted  SCREW LOCKING 3.5X20 - CT:3199366 Screw SCREW LOCKING 3.5X20  [employer]  Right 2 Implanted  SCREW HEADED ST 3.5X46 IS:[id number] Screw SCREW HEADED ST 3.5X46  [employer]  Right 1 Implanted     Indications for Surgery: 48 year old female with diabetes, congestive heart failure history of stroke and renal disease that presents after fall with a right ankle/pilon fracture dislocation.  Was reduced in the emergency room in [place].  Was admitted to [facility].  Discussed risks and benefits with the patient.  We will plan to proceed with open reduction internal fixation versus external fixation.  Risks  discussed included bleeding requiring blood transfusion, bleeding causing a hematoma, infection, malunion, nonunion, damage to surrounding nerves and blood vessels, pain, hardware prominence or irritation, hardware failure, stiffness, post-traumatic arthritis, DVT/PE, compartment syndrome, and even death.  She agrees to proceed with surgery and consent was obtained.  Operative Findings: 1.  Open reduction internal fixation of right ankle/pilon with a significantly large posterior malleolus fragment with involvement of the articular surface. 2.  Open reduction internal fixation of lateral malleolus using Synthes recon plate 3.  Fixation of posterior malleolus using Synthes 4.5 mm cannulated screws 4.  Independent fixation of medial malleolus using 3.5 mm Synthes nonlocking screws.  Procedure: The patient was identified in the preoperative holding area. Consent was confirmed with the patient and their family and all questions were answered. The operative extremity was marked after confirmation with the patient. she was then brought back to the operating room by our anesthesia colleagues.  She was placed under general anesthetic and carefully transferred over to a radiolucent flat top table a bump was placed under her operative hip.  Her splint was cut down and her swelling was appropriate for surgical incision.  The operative extremity was then prepped and draped in usual sterile fashion. A preoperative timeout was performed to verify the patient, the procedure, and the extremity. Preoperative antibiotics were dosed.  Fluoroscopic imaging was obtained that showed a unstable nature of her injury.  I started out by making a lateral approach to the distal fibula.  I carried this down through skin and subcutaneous tissue.  I carefully dissected to the bone.  I protected the superficial peroneal nerve throughout the case.  I then visualized the fracture.  There is some posterior comminution along the shaft  however there was a oblique fracture of the distal fibula.  The hematoma was cleared out.  The periosteum was excised with a 15 blade.  Reduction maneuver was performed with reduction tenaculums.  Anatomic reduction was able to be obtained and fluoroscopic imaging showed excellent length of the fibula and stability of the mortise.  Due to her large posterior malleolus fragment that extended all the way across her tibial plafond and the large size I felt that this would need fixation.  I placed a percutaneous incision just posterior to my incision to the fibula.  I placed a tine of the reduction tenaculum at the posterior malleolus.  I made a percutaneous incision along the anterior lateral aspect of the distal tibia.  I reduced and held the articular surface in place.  Fluoroscopic imaging confirmed adequate reduction of the joint.  The anterior lateral incision was extended distally.  K wires for the 4.5 mm cannulated screws were were then placed from anterior to posterior directed under AP and lateral fluoroscopic imaging.  These were measured and then fully threaded screws were placed obtaining excellent fixation.  Fluoroscopic imaging confirmed adequate placement.  Due to her diabetes as well as her peripheral neuropathy I felt that making a medial incision was high risk.  As result I felt that the reduction of her medial malleolus that was obtained through reduction of the other fracture fragments was near anatomic and could be treated with percutaneous fixation.  Using fluoroscopy as a guide I drilled and placed a 3.5 millimeter screws gaining bicortical fixation one anterior and one posterior in the medial malleolus.  Excellent fixation was obtained.  At this juncture return to the lateral side I contoured a Synthes recon plate to fit the posterior lateral aspect of the distal fibula and the lateral aspect of the proximal fibula.  The reduction was held provisionally with a K wire.  The clamp was  removed and the plate was placed.  In the proximal shaft segment a nonlocking screw was placed to hold the position of the plate.  Distally locking screws were placed in the distal fragment.  Another 2 nonlocking screws were placed proximally.  I then placed a quadricortical screw across the fibula into the tibia to reinforce the syndesmosis.  Final fluoroscopic images were obtained.  The incision was copiously irrigated.  A gram of vancomycin powder was placed into the wound.  The skin was closed with 2-0 Vicryl and 3-0 nylon.  An incisional wound VAC was placed to the lateral wound.  A sterile dressing was then placed to the other incisions.  These were dressed with bacitracin ointment, Adaptic, 4 x 4's and sterile cast padding.  An Ace wrap was placed and she was placed in a boot.  The patient was then awoken from anesthesia and taken to the PACU in stable condition.  Post Op Plan/Instructions: Patient will be nonweightbearing to the right lower extremity.  She will receive postoperative Ancef.  She will be placed on Lovenox for DVT prophylaxis.  She will mobilize with physical and Occupational Therapy.  I was present and performed the entire surgery.  Patrecia Pace, PA-C did assist me throughout the case. An assistant was necessary given the difficulty in approach, maintenance of reduction and ability to instrument the fracture.   Katha Hamming, MD [facility]

## 2019-07-26 NOTE — Anesthesia Procedure Notes (Signed)
Procedure Name: LMA Insertion Date/Time: 07/26/2019 1:35 PM Performed by: Marsa Aris, CRNA Pre-anesthesia Checklist: Patient identified, Emergency Drugs available, Suction available and Patient being monitored Patient Re-evaluated:Patient Re-evaluated prior to induction Oxygen Delivery Method: Circle System Utilized Preoxygenation: Pre-oxygenation with 100% oxygen Induction Type: IV induction Ventilation: Mask ventilation without difficulty LMA: LMA inserted LMA Size: 4.0 Number of attempts: 1 Airway Equipment and Method: Bite block Placement Confirmation: positive ETCO2 Tube secured with: Tape Dental Injury: Teeth and Oropharynx as per pre-operative assessment

## 2019-07-27 ENCOUNTER — Encounter (HOSPITAL_COMMUNITY): Payer: Self-pay | Admitting: Student

## 2019-07-27 LAB — BASIC METABOLIC PANEL
Anion gap: 8 (ref 5–15)
Anion gap: 11 (ref 5–15)
BUN: 36 mg/dL — ABNORMAL HIGH (ref 6–20)
BUN: 37 mg/dL — ABNORMAL HIGH (ref 6–20)
CO2: 23 mmol/L (ref 22–32)
CO2: 23 mmol/L (ref 22–32)
Calcium: 8.4 mg/dL — ABNORMAL LOW (ref 8.9–10.3)
Calcium: 9.1 mg/dL (ref 8.9–10.3)
Chloride: 102 mmol/L (ref 98–111)
Chloride: 102 mmol/L (ref 98–111)
Creatinine, Ser: 2.34 mg/dL — ABNORMAL HIGH (ref 0.44–1.00)
Creatinine, Ser: 2.67 mg/dL — ABNORMAL HIGH (ref 0.44–1.00)
GFR calc Af Amer: 28 mL/min — ABNORMAL LOW (ref >60)
GFR calc Af Amer: 24 mL/min — ABNORMAL LOW (ref >60)
GFR calc non Af Amer: 24 mL/min — ABNORMAL LOW (ref >60)
GFR calc non Af Amer: 20 mL/min — ABNORMAL LOW (ref >60)
Glucose, Bld: 324 mg/dL — ABNORMAL HIGH (ref 70–99)
Glucose, Bld: 257 mg/dL — ABNORMAL HIGH (ref 70–99)
Potassium: 6.7 mmol/L (ref 3.5–5.1)
Potassium: 4.7 mmol/L (ref 3.5–5.1)
Sodium: 133 mmol/L — ABNORMAL LOW (ref 135–145)
Sodium: 136 mmol/L (ref 135–145)

## 2019-07-27 LAB — VITAMIN D 25 HYDROXY (VIT D DEFICIENCY, FRACTURES): Vit D, 25-Hydroxy: 8.8 ng/mL — ABNORMAL LOW (ref 30.0–100.0)

## 2019-07-27 LAB — CBC
HCT: 29.9 % — ABNORMAL LOW (ref 36.0–46.0)
Hemoglobin: 9.3 g/dL — ABNORMAL LOW (ref 12.0–15.0)
MCH: 27.4 pg (ref 26.0–34.0)
MCHC: 31.1 g/dL (ref 30.0–36.0)
MCV: 87.9 fL (ref 80.0–100.0)
Platelets: 174 10*3/uL (ref 150–400)
RBC: 3.4 MIL/uL — ABNORMAL LOW (ref 3.87–5.11)
RDW: 14.5 % (ref 11.5–15.5)
WBC: 4.7 10*3/uL (ref 4.0–10.5)
nRBC: 0 % (ref 0.0–0.2)

## 2019-07-27 LAB — GLUCOSE, CAPILLARY
Glucose-Capillary: 347 mg/dL — ABNORMAL HIGH (ref 70–99)
Glucose-Capillary: 237 mg/dL — ABNORMAL HIGH (ref 70–99)
Glucose-Capillary: 196 mg/dL — ABNORMAL HIGH (ref 70–99)
Glucose-Capillary: 217 mg/dL — ABNORMAL HIGH (ref 70–99)

## 2019-07-27 MED ORDER — VITAMIN D (ERGOCALCIFEROL) 1.25 MG (50000 UNIT) PO CAPS
50000.0000 [IU] | ORAL_CAPSULE | ORAL | Status: DC
  Administered 2019-07-27: 50000 [IU] via ORAL
  Filled 2019-07-27: qty 1

## 2019-07-27 MED ORDER — ENSURE MAX PROTEIN PO LIQD
11.0000 [oz_av] | Freq: Every day | ORAL | Status: DC
  Administered 2019-07-27 – 2019-07-29 (×3): 11 [oz_av] via ORAL
  Filled 2019-07-27 (×3): qty 330

## 2019-07-27 MED ORDER — CALCIUM GLUCONATE-NACL 1-0.675 GM/50ML-% IV SOLN
1.0000 g | Freq: Once | INTRAVENOUS | Status: AC
  Administered 2019-07-27: 08:00:00 1000 mg via INTRAVENOUS
  Filled 2019-07-27: qty 50

## 2019-07-27 MED ORDER — INFLUENZA VAC SPLIT QUAD 0.5 ML IM SUSY
0.5000 mL | PREFILLED_SYRINGE | INTRAMUSCULAR | Status: DC
  Filled 2019-07-27: qty 0.5

## 2019-07-27 MED ORDER — SODIUM POLYSTYRENE SULFONATE 15 GM/60ML PO SUSP
30.0000 g | Freq: Once | ORAL | Status: AC
  Administered 2019-07-27: 30 g via ORAL
  Filled 2019-07-27: qty 120

## 2019-07-27 MED ORDER — VITAMIN D 25 MCG (1000 UNIT) PO TABS
2000.0000 [IU] | ORAL_TABLET | Freq: Two times a day (BID) | ORAL | Status: DC
  Administered 2019-07-27 – 2019-07-29 (×5): 2000 [IU] via ORAL
  Filled 2019-07-27 (×5): qty 2

## 2019-07-27 MED ORDER — METHOCARBAMOL 500 MG PO TABS
500.0000 mg | ORAL_TABLET | Freq: Once | ORAL | Status: AC
  Administered 2019-07-27: 500 mg via ORAL
  Filled 2019-07-27: qty 1

## 2019-07-27 MED ORDER — INSULIN ASPART 100 UNIT/ML IV SOLN
10.0000 [IU] | Freq: Once | INTRAVENOUS | Status: AC
  Administered 2019-07-27: 08:00:00 10 [IU] via INTRAVENOUS

## 2019-07-27 MED ORDER — DEXTROSE 50 % IV SOLN
1.0000 | Freq: Once | INTRAVENOUS | Status: DC
  Filled 2019-07-27: qty 50

## 2019-07-27 MED ORDER — INSULIN DETEMIR 100 UNIT/ML ~~LOC~~ SOLN
15.0000 [IU] | Freq: Every day | SUBCUTANEOUS | Status: DC
  Administered 2019-07-27 – 2019-07-29 (×3): 15 [IU] via SUBCUTANEOUS
  Filled 2019-07-27 (×3): qty 0.15

## 2019-07-27 MED ORDER — INSULIN DETEMIR 100 UNIT/ML ~~LOC~~ SOLN: 30.0000 [IU] | Freq: Every day | SUBCUTANEOUS | Status: DC

## 2019-07-27 MED ORDER — HYDROMORPHONE HCL 1 MG/ML IJ SOLN
1.0000 mg | INTRAMUSCULAR | Status: DC | PRN
  Administered 2019-07-27 – 2019-07-28 (×5): 1 mg via INTRAVENOUS
  Filled 2019-07-27 (×5): qty 1

## 2019-07-27 MED ORDER — METHOCARBAMOL 500 MG PO TABS
500.0000 mg | ORAL_TABLET | Freq: Four times a day (QID) | ORAL | Status: DC | PRN
  Administered 2019-07-27 – 2019-07-28 (×4): 500 mg via ORAL
  Filled 2019-07-27 (×4): qty 1

## 2019-07-27 NOTE — Progress Notes (Addendum)
Progress Note   [name] [id number] DOB: 02/14/1971 DOA: 07/25/2019  PCP: Cher Nakai, MD  Patient coming from: Patient was transferred from [facility].  Chief Complaint: Right ankle fracture.  HPI: Deanna Landry is a 48 y.o. female with history of diabetes mellitus type 2, hypertension, hypothyroidism, chronic kidney disease stage III, history of stroke history of legal blindness had a fall at home while patient was in the kitchen at around 4 AM yesterday.  Patient states he suddenly stumbled and fell but did not hit her head or lose consciousness.  Patient was feeling diaphoretic and weak just like hypoglycemic episode.  Since patient had persistent pain in the right ankle since the fall patient decided to come to the ER later in the evening.  X-rays revealed right trimalleolar fracture of the ankle and orthopedic surgery was not comfortable at [facility] and transferred to [facility].  Dr. Stann Mainland will be the accepting physician for orthopedics at [facility].  On my exam patient denies any chest pain or shortness of breath palpitations nausea vomiting or diarrhea.  Patient does not recall her last blood sugar.  Labs reviewed at [facility].   Subjective: No acute issues or events overnight, patient reports pain continues to be 10 out of 10 despite medication changes with orthopedic surgery.  Otherwise declines chest pain, shortness of breath, nausea, vomiting, diarrhea, constipation, headache, fever, chills.  Assessment/Plan Principal Problem:   Closed right pilon fracture, initial encounter Active Problems:   Diabetes mellitus type 2 in obese [facility])   Hypertension   Acute closed right ankle fracture trimalleolar  -Orthopedic surgery following, ORIF 07/26/2019, tolerated well -Pain control/DVT prophylaxis per orthopedic surgery -PT OT ongoing, possible need for placement pending clinical improvement  Diabetes mellitus type 2  -Continue sliding scale coverage,  hypoglycemic protocol  -Likely to resume patient's home Levemir -typically 30 units at bedtime  Hyperkalemia  -Calcium 6.7 this morning, Kayexalate given x1, follow repeat labs this afternoon Repeat k 4.7 this afternoon  Hypertension, essential -Continue amlodipine and Isordil.  CVA history  -Continue atorvastatin and Plavix postoperatively if okay with orthopedics/pending DVT prophylaxis  Mild AKI on Chronic disease stage III  -baseline around 2, creatinine currently 2.6 -Increase PO intake as tolerated - likely 2/2 poor PO intake perioperatively -Follow with morning labs  Normocytic anemia, likely anemia of chronic disease given above Baseline around 10  Follow a.m. labs  DVT prophylaxis: SCDs in anticipation of surgery Code Status: Full code. Disposition Plan: Pending clinical improvement, PT OT evaluation and improvement in pain and ambulatory status Consults called: Orthopedics. Admission status: Inpatient pending surgical intervention/evaluation and ongoing physical therapy  Physical Exam: Constitutional: Moderately built and nourished. Vitals:   07/26/19 2023 07/27/19 0519 07/27/19 1042 07/27/19 1405  BP: (!) 131/50 (!) 124/50 (!) 96/58 (!) 95/51  Pulse: 78 75 76 70  Resp: 16 15 17 14   Temp: 98.3 F (36.8 C) 98.1 F (36.7 C) 98.4 F (36.9 C) 98.9 F (37.2 C)  TempSrc: Oral Oral Oral Oral  SpO2: 100% 97% 96% 92%  Weight:      Height:       ENMT: No discharge from the ears eyes nose or mouth. Neck: No mass felt.  No neck rigidity. Respiratory: No rhonchi or crepitations. Cardiovascular: S1-S2 heard. Abdomen: Soft nontender bowel sounds present. Musculoskeletal: Right leg is in the brace. Skin: No rash. Neurologic: Alert awake oriented to time place and person.  Moves all extremities.  Labs on Admission: I  have personally reviewed following labs and imaging studies  CBC: Recent Labs  Lab 07/26/19 0136 07/27/19 0216  WBC 5.0 4.7  NEUTROABS 3.4  --     HGB 11.6* 9.3*  HCT 36.5 29.9*  MCV 86.7 87.9  PLT 220 [id number]   Basic Metabolic Panel: Recent Labs  Lab 07/26/19 0136 07/27/19 0216 07/27/19 1220  NA 138 133* 136  K 5.2* 6.7* 4.7  CL 105 102 102  CO2 24 23 23   GLUCOSE 190* 324* 257*  BUN 28* 36* 37*  CREATININE 2.29* 2.34* 2.67*  CALCIUM 9.1 8.4* 9.1   GFR: Estimated Creatinine Clearance: 26.2 mL/min (A) (by C-G formula based on SCr of 2.67 mg/dL (H)). Liver Function Tests: Recent Labs  Lab 07/26/19 0136  AST 17  ALT 14  ALKPHOS 112  BILITOT 0.4  PROT 7.0  ALBUMIN 3.5   CBG: Recent Labs  Lab 07/26/19 1109 07/26/19 1539 07/26/19 2018 07/27/19 0741 07/27/19 1150  GLUCAP 118* 205* 255* 347* 237*   Urine analysis:    Component Value Date/Time   COLORURINE YELLOW 03/10/2019 0745   APPEARANCEUR CLEAR 03/10/2019 0745   LABSPEC 1.010 03/10/2019 0745   PHURINE 5.0 03/10/2019 0745   GLUCOSEU >=500 (A) 03/10/2019 0745   HGBUR NEGATIVE 03/10/2019 0745   BILIRUBINUR NEGATIVE 03/10/2019 0745   KETONESUR NEGATIVE 03/10/2019 0745   PROTEINUR NEGATIVE 03/10/2019 0745   NITRITE NEGATIVE 03/10/2019 0745   LEUKOCYTESUR TRACE (A) 03/10/2019 0745    Recent Results (from the past 240 hour(s))  SARS CORONAVIRUS 2 (TAT 6-24 HRS) Nasopharyngeal Nasopharyngeal Swab     Status: None   Collection Time: 07/26/19 12:17 AM   Specimen: Nasopharyngeal Swab  Result Value Ref Range Status   SARS Coronavirus 2 NEGATIVE NEGATIVE Final    Comment: (NOTE) SARS-CoV-2 target nucleic acids are NOT DETECTED. The SARS-CoV-2 RNA is generally detectable in upper and lower respiratory specimens during the acute phase of infection. Negative results do not preclude SARS-CoV-2 infection, do not rule out co-infections with other pathogens, and should not be used as the sole basis for treatment or other patient management decisions. Negative results must be combined with clinical observations, patient history, and epidemiological information. The  expected result is Negative. Fact Sheet for Patients: SugarRoll.be Fact Sheet for Healthcare Providers: https://www.woods-mathews.com/ This test is not yet approved or cleared by the [place] FDA and  has been authorized for detection and/or diagnosis of SARS-CoV-2 by FDA under an Emergency Use Authorization (EUA). This EUA will remain  in effect (meaning this test can be used) for the duration of the COVID-19 declaration under Section 56 4(b)(1) of the Act, 21 U.S.C. section 360bbb-3(b)(1), unless the authorization is terminated or revoked sooner. Performed at [facility], [place] [address], [place], [place] [address]   Surgical pcr screen     Status: None   Collection Time: 07/26/19 12:42 AM   Specimen: Nasal Mucosa; Nasal Swab  Result Value Ref Range Status   MRSA, PCR NEGATIVE NEGATIVE Final   Staphylococcus aureus NEGATIVE NEGATIVE Final    Comment: (NOTE) The Xpert SA Assay (FDA approved for NASAL specimens in patients 4 years of age and older), is one component of a comprehensive surveillance program. It is not intended to diagnose infection nor to guide or monitor treatment. Performed at [facility], [place] [address], [place],  [address]      Radiological Exams on Admission: Dg Ankle Complete Right  Result Date: 07/26/2019 CLINICAL DATA:  ORIF ankle fracture  EXAM: RIGHT ANKLE - COMPLETE 3+ VIEW; DG C-ARM 1-60 MIN COMPARISON:  CT right ankle dated 07/26/2019 FLUOROSCOPY TIME:  2 minutes 1 second FINDINGS: Intraoperative fluoroscopic radiographs during lateral compression plate and screw fixation of the distal fibula with placement of a syndesmotic screw. Two cannulated cancellous screws transfix the medial malleolus. Two additional screws transfix the posterior malleolar fracture. Fracture fragments are in near anatomic alignment and position. IMPRESSION: Intraoperative fluoroscopic radiographs during ORIF of a  trimalleolar ankle fracture, as above. Electronically Signed   By: Julian Hy M.D.   On: 07/26/2019 17:21   Ct Ankle Right Wo Contrast  Result Date: 07/26/2019 CLINICAL DATA:  Status post fall, right ankle fracture EXAM: CT OF THE RIGHT ANKLE WITHOUT CONTRAST TECHNIQUE: Multidetector CT imaging of the right ankle was performed according to the standard protocol. Multiplanar CT image reconstructions were also generated. COMPARISON:  None. FINDINGS: Bones/Joint/Cartilage Comminuted fracture of the distal tibial metaphysis extending to the articular surface with the fracture extending into the medial malleolus and involving the posterior malleolus. 6 mm of posterior displacement of the posterior malleolus. Narrowing of the medial tibiotalar joint. Comminuted fracture of the distal fibular diametaphysis with 9 mm of posterior displacement. No other acute fracture or dislocation. No aggressive osseous lesion. Small plantar calcaneal spur. Small ankle joint effusion. Ligaments Ligaments are suboptimally evaluated by CT. Muscles and Tendons Muscles are normal. Flexor, extensor, peroneal and Achilles tendons are intact. Soft tissue No fluid collection or hematoma.  No soft tissue mass. IMPRESSION: 1. Trimalleolar right ankle fracture as detailed above. Electronically Signed   By: Kathreen Devoid   On: 07/26/2019 10:51   Chest Portable 1 View  Result Date: 07/26/2019 CLINICAL DATA:  Preop for right foot surgery EXAM: PORTABLE CHEST 1 VIEW COMPARISON:  03/12/2019 FINDINGS: Low volume chest with streaky density at both lung bases-seen to some degree on prior. No acute airspace disease. No pleural fluid or pneumothorax. Borderline heart size accentuated by low volumes. IMPRESSION: Low volume chest with atelectasis or scarring in the lower lobes. Electronically Signed   By: Monte Fantasia M.D.   On: 07/26/2019 07:05   Dg Ankle Right Port  Result Date: 07/26/2019 CLINICAL DATA:  Postop right ankle fracture EXAM:  PORTABLE RIGHT ANKLE - 2 VIEW COMPARISON:  CT right ankle dated 07/26/2019 FINDINGS: Lateral compression plate and screw fixation of a distal fibular fracture with placement of a syndesmotic screw. Two cannulated cancellous screws transfix the medial malleolus. Two additional screws transfix the posterior malleolar fracture. Fracture fragments are in near anatomic alignment and position. Mild soft tissue swelling. IMPRESSION: Status post ORIF of a trimalleolar ankle fracture, as above. Electronically Signed   By: Julian Hy M.D.   On: 07/26/2019 17:23   Dg C-arm 1-60 Min  Result Date: 07/26/2019 CLINICAL DATA:  ORIF ankle fracture EXAM: RIGHT ANKLE - COMPLETE 3+ VIEW; DG C-ARM 1-60 MIN COMPARISON:  CT right ankle dated 07/26/2019 FLUOROSCOPY TIME:  2 minutes 1 second FINDINGS: Intraoperative fluoroscopic radiographs during lateral compression plate and screw fixation of the distal fibula with placement of a syndesmotic screw. Two cannulated cancellous screws transfix the medial malleolus. Two additional screws transfix the posterior malleolar fracture. Fracture fragments are in near anatomic alignment and position. IMPRESSION: Intraoperative fluoroscopic radiographs during ORIF of a trimalleolar ankle fracture, as above. Electronically Signed   By: Julian Hy M.D.   On: 07/26/2019 17:21    EKG: Independently reviewed.  Normal sinus rhythm.   Little Ishikawa DO [facility]  Pager: [email or web address] Password [email or web address]  07/27/2019, 2:18 PM

## 2019-07-27 NOTE — Evaluation (Signed)
Physical Therapy Evaluation Patient Details Name: Deanna Landry MRN: [id number] DOB: [birth year] Today's Date: 07/27/2019   History of Present Illness  Pt is a 48 y.o. female who fell at home sustaining R ankle fx. She underwent ORIF 07/26/19. PMH consists of DM type 1, blindness due to diabetic retinopathy, strok, MI, HTN and CHF.    Clinical Impression  Pt admitted with above diagnosis. Pt currently with functional limitations due to the deficits listed below (see PT Problem List). Mobility significantly limited on eval by pain. She required min assist rolling and mod assist supine to sit. Pt also with K+ of 6.7. Unable to progress OOB. Pt will benefit from skilled PT to increase their independence and safety with mobility to allow discharge to the venue listed below.       Follow Up Recommendations SNF    Equipment Recommendations  Other (comment)(defer)    Recommendations for Other Services       Precautions / Restrictions Precautions Precautions: Fall Required Braces or Orthoses: Other Brace Other Brace: CAM boot RLE Restrictions Weight Bearing Restrictions: Yes RLE Weight Bearing: Non weight bearing      Mobility  Bed Mobility Overal bed mobility: Needs Assistance Bed Mobility: Rolling;Supine to Sit;Sit to Supine Rolling: Min assist   Supine to sit: Mod assist;HOB elevated Sit to supine: Mod assist;HOB elevated   General bed mobility comments: +rail. Cues for sequencing. Mobility limited due to pain and increased K+ (6.7).  Transfers                 General transfer comment: unable due to pain  Ambulation/Gait             General Gait Details: unable  Stairs            Wheelchair Mobility    Modified Rankin (Stroke Patients Only)       Balance                                             Pertinent Vitals/Pain Pain Assessment: 0-10 Pain Score: 10-Worst pain ever Pain Location: RLE Pain Descriptors / Indicators:  Constant;Grimacing;Guarding;Crying Pain Intervention(s): Limited activity within patient's tolerance;Repositioned;Patient requesting pain meds-RN notified    Home Living Family/patient expects to be discharged to:: Private residence Living Arrangements: Parent Available Help at Discharge: Family;Available 24 hours/day Type of Home: House Home Access: Ramped entrance     Home Layout: One level Home Equipment: Other (comment) Additional Comments: blind cane, home O2    Prior Function Level of Independence: Independent with assistive device(s)         Comments: Pt on 3 L O2 at home.     Hand Dominance        Extremity/Trunk Assessment   Upper Extremity Assessment Upper Extremity Assessment: Overall WFL for tasks assessed    Lower Extremity Assessment Lower Extremity Assessment: RLE deficits/detail;LLE deficits/detail RLE Deficits / Details: s/p ankle ORIF RLE Sensation: history of peripheral neuropathy LLE Sensation: history of peripheral neuropathy       Communication   Communication: No difficulties  Cognition Arousal/Alertness: Awake/alert Behavior During Therapy: Restless;Anxious Overall Cognitive Status: Within Functional Limits for tasks assessed                                 General Comments: restless/anxious due to pain  General Comments General comments (skin integrity, edema, etc.): K+ 6.7    Exercises     Assessment/Plan    PT Assessment Patient needs continued PT services  PT Problem List Decreased mobility;Decreased knowledge of precautions;Decreased activity tolerance;Pain;Decreased balance;Decreased knowledge of use of DME       PT Treatment Interventions DME instruction;Therapeutic activities;Gait training;Therapeutic exercise;Patient/family education;Balance training;Functional mobility training    PT Goals (Current goals can be found in the Care Plan section)  Acute Rehab PT Goals Patient Stated Goal: decrease  pain PT Goal Formulation: With patient Time For Goal Achievement: 08/10/19 Potential to Achieve Goals: Good    Frequency Min 3X/week   Barriers to discharge        Co-evaluation               AM-PAC PT "6 Clicks" Mobility  Outcome Measure Help needed turning from your back to your side while in a flat bed without using bedrails?: A Little Help needed moving from lying on your back to sitting on the side of a flat bed without using bedrails?: A Lot Help needed moving to and from a bed to a chair (including a wheelchair)?: A Lot Help needed standing up from a chair using your arms (e.g., wheelchair or bedside chair)?: A Lot Help needed to walk in hospital room?: Total Help needed climbing 3-5 steps with a railing? : Total 6 Click Score: 11    End of Session Equipment Utilized During Treatment: Oxygen Activity Tolerance: Patient limited by pain;Treatment limited secondary to medical complications (Comment)(K+ 6.7) Patient left: in bed;with call bell/phone within reach Nurse Communication: Patient requests pain meds;Mobility status PT Visit Diagnosis: Other abnormalities of gait and mobility (R26.89);Pain Pain - Right/Left: Right Pain - part of body: Ankle and joints of foot    Time: [id number] PT Time Calculation (min) (ACUTE ONLY): 12 min   Charges:   PT Evaluation $PT Eval Moderate Complexity: 1 Mod          Lorrin Goodell, PT  Office # [phone] Pager [phone]   Lorriane Shire 07/27/2019, 9:22 AM

## 2019-07-27 NOTE — Plan of Care (Signed)
  Problem: Education: Goal: Knowledge of General Education information will improve Description: Including pain rating scale, medication(s)/side effects and non-pharmacologic comfort measures 07/27/2019 2244 by Keturah Shavers, RN Outcome: Progressing 07/27/2019 2241 by Keturah Shavers, RN Outcome: Progressing   Problem: Health Behavior/Discharge Planning: Goal: Ability to manage health-related needs will improve 07/27/2019 2244 by Keturah Shavers, RN Outcome: Progressing 07/27/2019 2241 by Keturah Shavers, RN Outcome: Progressing   Problem: Clinical Measurements: Goal: Ability to maintain clinical measurements within normal limits will improve 07/27/2019 2244 by Keturah Shavers, RN Outcome: Progressing 07/27/2019 2241 by Keturah Shavers, RN Outcome: Progressing   Problem: Clinical Measurements: Goal: Will remain free from infection 07/27/2019 2244 by Keturah Shavers, RN Outcome: Progressing 07/27/2019 2241 by Keturah Shavers, RN Outcome: Progressing   Problem: Clinical Measurements: Goal: Diagnostic test results will improve 07/27/2019 2244 by Keturah Shavers, RN Outcome: Progressing 07/27/2019 2241 by Keturah Shavers, RN Outcome: Progressing

## 2019-07-27 NOTE — Anesthesia Postprocedure Evaluation (Signed)
Anesthesia Post Note  Patient: Deanna Landry  Procedure(s) Performed: OPEN REDUCTION INTERNAL FIXATION (ORIF) ANKLE FRACTURE (Right Ankle)     Patient location during evaluation: PACU Anesthesia Type: General Level of consciousness: awake and alert Pain management: pain level controlled Vital Signs Assessment: post-procedure vital signs reviewed and stable Respiratory status: spontaneous breathing, nonlabored ventilation, respiratory function stable and patient connected to nasal cannula oxygen Cardiovascular status: blood pressure returned to baseline and stable Postop Assessment: no apparent nausea or vomiting Anesthetic complications: no    Last Vitals:  Vitals:   07/26/19 2023 07/27/19 0519  BP: (!) 131/50 (!) 124/50  Pulse: 78 75  Resp: 16 15  Temp: 36.8 C 36.7 C  SpO2: 100% 97%    Last Pain:  Vitals:   07/27/19 0609  TempSrc:   PainSc: [name]

## 2019-07-27 NOTE — Progress Notes (Signed)
Nutrition Follow-up  RD working remotely.  DOCUMENTATION CODES:   Obesity unspecified  INTERVENTION:   -Continue MVI with minerals daily -Ensure Max po BID, each supplement provides 150 kcal and 30 grams of protein -Magic cup TID with meals, each supplement provides 290 kcal and 9 grams of protein  NUTRITION DIAGNOSIS:   Increased nutrient needs related to post-op healing as evidenced by estimated needs.  Ongoing  GOAL:   Patient will meet greater than or equal to 90% of their needs  Unmet  MONITOR:   PO intake, Diet advancement, Weight trends, Skin, I & O's  REASON FOR ASSESSMENT:   Consult Assessment of nutrition requirement/status  ASSESSMENT:   Elayjah D Vanderstelt is a 48 y.o. female with history of diabetes mellitus type 2, hypertension, hypothyroidism, chronic kidney disease stage III, history of stroke history of legal blindness had a fall at home while patient was in the kitchen at around 4 AM yesterday.  Patient states he suddenly stumbled and fell but did not hit her head or lose consciousness.  Patient was feeling diaphoretic and weak just like hypoglycemic episode.  Since patient had persistent pain in the right ankle since the fall patient decided to come to the ER later in the evening.  X-rays revealed right trimalleolar fracture of the ankle and orthopedic surgery was not comfortable at [facility] and transferred to [facility].  Dr. Stann Mainland will be the accepting physician for orthopedics at [facility].  On my exam patient denies any chest pain or shortness of breath palpitations nausea vomiting or diarrhea.  Patient does not recall her last blood sugar.  Labs reviewed at [facility].  9/9- s/p Open reduction internal fixation of right pilon fracture (fibula and tibia)  Reviewed I/O's: +940 ml x 24 hours  Pt struggling with pain and muscle spasms this morning. Pt with poor meal intake, likely to to pain (meal completion 25%). Per orthopedics operative  notes, pt with wound vac to incision.   Per PT notes, recommending SNF at discharge.   Labs reviewed: Na: 133K: 6.7, CBGS: 255-347 (inpatient orders for glycemic control are 0-9 units insulin aspart TID with meals and q HD and 15 units insulin detemir daily).   Diet Order:   Diet Order            Diet Carb Modified Fluid consistency: Thin; Room service appropriate? Yes  Diet effective now              EDUCATION NEEDS:   No education needs have been identified at this time  Skin:  Skin Assessment: Skin Integrity Issues: Skin Integrity Issues:: Wound VAC Wound Vac: rt leg  Last BM:  Unknown  Height:   Ht Readings from Last 1 Encounters:  07/26/19 5\' 2"  (1.575 m)    Weight:   Wt Readings from Last 1 Encounters:  07/26/19 84.4 kg    Ideal Body Weight:  50 kg  BMI:  Body mass index is 34.02 kg/m.  Estimated Nutritional Needs:   Kcal:  1550-1750  Protein:  85-100 grams  Fluid:  > 1.5 L    Trentin Knappenberger A. Jimmye Norman, RD, LDN, [place] Registered Dietitian II Certified Diabetes Care and Education Specialist Pager: [phone] After hours Pager: [phone]

## 2019-07-27 NOTE — Progress Notes (Signed)
CRITICAL VALUE ALERT  Critical Value:  Potassium 6.7  Date & Time Notied:  07/27/2019, [id number]  Provider Notified: Baltazar Najjar, MD  Orders Received/Actions taken: awaiting for orders

## 2019-07-27 NOTE — Progress Notes (Signed)
Orthopaedic Trauma Progress Note  S: Having a lot of pain and muscle spasms this morning. Nerve block has not fully worn off yet. Robaxin ordered, will be administered soon.  Has neuropathy in both feet at baseline. Patient found to have severe Vitamin D deficiency on post-op labs, will start on supplement today. Would recommend continuing this at discharge  O:  Vitals:   07/26/19 2023 07/27/19 0519  BP: (!) 131/50 (!) 124/50  Pulse: 78 75  Resp: 16 15  Temp: 98.3 F (36.8 C) 98.1 F (36.7 C)  SpO2: 100% 97%    General - Laying in bed, NAD Cardiac - Heart regular rate and rhythm Respiratory - No increased work of breathing. Lungs CTA anterior lung fields bilaterally Right Lower Extremity - CAM walker in place. Dressing clean, dry, intact. Incisional vac with good seal and function. Unable to wiggle toes, nerve block not fully worn off. Sensation diminished over dorsal and plantar aspect of foot as well as over anterior ankle. Otherwise intact to light touch of the extremity. 2+ DP pulse  Imaging: Stable post op imaging.   Labs:  Results for orders placed or performed during the hospital encounter of 07/25/19 (from the past 24 hour(s))  Glucose, capillary     Status: Abnormal   Collection Time: 07/26/19 11:09 AM  Result Value Ref Range   Glucose-Capillary 118 (H) 70 - 99 mg/dL  Glucose, capillary     Status: Abnormal   Collection Time: 07/26/19  3:39 PM  Result Value Ref Range   Glucose-Capillary 205 (H) 70 - 99 mg/dL   Comment 1 Notify RN   VITAMIN D 25 Hydroxy (Vit-D Deficiency, Fractures)     Status: Abnormal   Collection Time: 07/26/19  4:59 PM  Result Value Ref Range   Vit D, 25-Hydroxy 8.8 (L) 30.0 - 100.0 ng/mL  Glucose, capillary     Status: Abnormal   Collection Time: 07/26/19  8:18 PM  Result Value Ref Range   Glucose-Capillary 255 (H) 70 - 99 mg/dL  CBC     Status: Abnormal   Collection Time: 07/27/19  2:16 AM  Result Value Ref Range   WBC 4.7 4.0 - 10.5 K/uL   RBC 3.40 (L) 3.87 - 5.11 MIL/uL   Hemoglobin 9.3 (L) 12.0 - 15.0 g/dL   HCT 29.9 (L) 36.0 - 46.0 %   MCV 87.9 80.0 - 100.0 fL   MCH 27.4 26.0 - 34.0 pg   MCHC 31.1 30.0 - 36.0 g/dL   RDW 14.5 11.5 - 15.5 %   Platelets 174 150 - 400 K/uL   nRBC 0.0 0.0 - 0.2 %  Basic metabolic panel     Status: Abnormal   Collection Time: 07/27/19  2:16 AM  Result Value Ref Range   Sodium 133 (L) 135 - 145 mmol/L   Potassium 6.7 (HH) 3.5 - 5.1 mmol/L   Chloride 102 98 - 111 mmol/L   CO2 23 22 - 32 mmol/L   Glucose, Bld 324 (H) 70 - 99 mg/dL   BUN 36 (H) 6 - 20 mg/dL   Creatinine, Ser 2.34 (H) 0.44 - 1.00 mg/dL   Calcium 8.4 (L) 8.9 - 10.3 mg/dL   GFR calc non Af Amer 24 (L) >60 mL/min   GFR calc Af Amer 28 (L) >60 mL/min   Anion gap 8 5 - 15  Glucose, capillary     Status: Abnormal   Collection Time: 07/27/19  7:41 AM  Result Value Ref Range   Glucose-Capillary 347 (  H) 70 - 99 mg/dL    Assessment: 48 year old female s/p fall  Injuries: Right pilon fracture s/p ORIF  Weightbearing: NWB RLE  Insicional and dressing care: dressing c/d/i. Will remove dressing and incisional vac tomorrow  Orthopedic device(s): CAM walker RLE  CV/Blood loss: Acute blood loss anemia, Hgb 9.3. Hemodynamically stable  Pain management:  1. Tylenol 1000 mg q 6 hours scheduled 2. Robaxin 500 mg q 6 hours PRN 3. Oxycodone 5-15 mg q 4 hours PRN 4. Lyrica 75 mg BID 5. Dilaudid 1 mg q 3 hours PRN  VTE prophylaxis: Lovenox starting today  ID:  Ancef 2gm post op  Foley/Lines:  No foley, KVO IVFs  Medical co-morbidities: diabetes mellitus type 2, hypertension, hypothyroidism, chronic kidney disease stage III, history of stroke, history of legal blindness  Impediments to Fracture Healing: Diabetes  Dispo: PT/OT eval today, dispo pending. Start Vit D supplement today. wokr on achieving better pain control once nerve block wears off fully  Follow - up plan: 2 weeks for suture removal and repeat x-rays    Deanna Landry  [facility] ?[phone]? (phone)

## 2019-07-27 NOTE — TOC Progression Note (Addendum)
Transition of Care [facility]) - Progression Note    Patient Details  Name: Deanna Landry MRN: [id number] Date of Birth: 1971/11/07  Transition [facility] Care [facility]) CM/SW Contact  Jacalyn Lefevre Edson Snowball, RN Phone Number: 07/27/2019, 12:23 PM  Clinical Narrative:     Discussed discharge plan with patient at bedside. From home with her mother Deanna Landry [id number] . Patient gave permission for NCM to call her mother. Called from patient's room and left voicemail. Patient has home oxygen with [employer] and uses a cane at home.   Patient wanting to go to SNF for short term rehab. Patient would like NCM to fax to SNF's in [facility] and [place] .  Once NCM has bed offers will provide same to patient.   1430 provided bed offers.  1525 Patient has accepted bed offer from [facility]. [place] [phone] and spoke to [facility] patient's mother and no answer again.Marland Kitchen  Received a call from [employer] at [facility] she did make a bed offer however she will not have a bed until 08/01/19 . Patient aware and agreeable to [facility]. Called Harriet Pho at [facility]. Expected Discharge Plan: [place] Barriers to Discharge: Continued Medical Work up, Ship broker  Expected Discharge Plan and Services Expected Discharge Plan: [place] In-house Referral: Clinical Social Work Discharge Planning Services: CM Consult Post Acute Care Choice: NA(unsure at this time) Living arrangements for the past 2 months: Single Family Home                                       Social Determinants of Health (SDOH) Interventions    Readmission Risk Interventions No flowsheet data found.

## 2019-07-27 NOTE — NC FL2 (Signed)
[place] LEVEL OF CARE SCREENING TOOL     IDENTIFICATION  Patient Name: Deanna Landry Birthdate: [birth year] Sex: female Admission Date (Current Location): 07/25/2019  [facility] and [place] Number:  Herbalist and Address:         Provider Number: [id number]  Attending Physician Name and Address:  Little Ishikawa, MD  Relative Name and Phone Number:  Hannah Beat [phone]    Current Level of Care: Hospital Recommended Level of Care: [place] Prior Approval Number:    Date Approved/Denied:   PASRR Number: [id number]  Discharge Plan: SNF    Current Diagnoses: Patient Active Problem List   Diagnosis Date Noted  . Closed right pilon fracture, initial encounter 07/26/2019  . Diabetes mellitus type 2 in obese ([place]) 07/26/2019  . Hypertension 07/26/2019  . Pneumonia 03/15/2019  . Type 1 DM with CKD and hypertension ([place]) 03/15/2019  . Acute kidney injury superimposed on CKD ([place]) 03/15/2019  . Suicidal ideation 03/15/2019  . Acute respiratory failure with hypoxia ([place]) 03/15/2019  . Adjustment disorder with mixed anxiety and depressed mood 03/11/2019  . Fever 03/10/2019  . Sepsis ([place]) 03/10/2019  . MDD (major depressive disorder), severe ([place]) 03/09/2019  . Major depression, recurrent ([place]) 03/09/2019  . Diabetic polyneuropathy ([place]) 05/04/2016  . Neck pain 04/21/2016    Orientation RESPIRATION BLADDER Height & Weight     Self, Time, Situation, Place  Normal Continent Weight: 84.4 kg Height:  5\' 2"  (157.5 cm)  BEHAVIORAL SYMPTOMS/MOOD NEUROLOGICAL BOWEL NUTRITION STATUS      Continent Diet  AMBULATORY STATUS COMMUNICATION OF NEEDS Skin   Extensive Assist Verbally Surgical wounds(right leg)                       Personal Care Assistance Level of Assistance  Bathing, Dressing Bathing Assistance: Maximum assistance Feeding assistance: Limited assistance Dressing Assistance: Maximum assistance     Functional  Limitations Info  Sight Sight Info: Impaired(legally blind)        SPECIAL CARE FACTORS FREQUENCY  PT (By licensed PT), OT (By licensed OT)     PT Frequency: 5 times a week OT Frequency: 5 times a week            Contractures Contractures Info: Not present    Additional Factors Info  Code Status, Allergies, Psychotropic, Insulin Sliding Scale Code Status Info: full Allergies Info: no known allergies Psychotropic Info: Zoloft 100 mg PO daily, Wellbutrin XL 24 hour tab 150 mg daily Insulin Sliding Scale Info: Novolog sliding scale 3 times a day with meals and at bedtime, Levemir 15u SQ daily       Current Medications (07/27/2019):  This is the current hospital active medication list Current Facility-Administered Medications  Medication Dose Route Frequency Provider Last Rate Last Dose  . 0.9 %  sodium chloride infusion   Intravenous Continuous Delray Alt, PA-C 10 mL/hr at 07/26/19 1143    . acetaminophen (TYLENOL) tablet 1,000 mg  1,000 mg Oral Q6H Patrecia Pace A, PA-C   1,000 mg at 07/27/19 1207  . amLODipine (NORVASC) tablet 10 mg  10 mg Oral Daily Patrecia Pace A, PA-C   10 mg at 07/27/19 0834  . atorvastatin (LIPITOR) tablet 80 mg  80 mg Oral Daily Patrecia Pace A, PA-C   80 mg at 07/27/19 0834  . atropine 1 % ophthalmic solution 1 drop  1 drop Right Eye BID Delray Alt, PA-C   1  drop at 07/27/19 0835  . buPROPion (WELLBUTRIN XL) 24 hr tablet 150 mg  150 mg Oral Daily Patrecia Pace A, PA-C   150 mg at 07/27/19 [id number]  . ceFAZolin (ANCEF) IVPB 2g/100 mL premix  2 g Intravenous Q8H Patrecia Pace A, PA-C 200 mL/hr at 07/27/19 0554 2 g at 07/27/19 0554  . cholecalciferol (VITAMIN D3) tablet 2,000 Units  2,000 Units Oral BID Delray Alt, PA-C   2,000 Units at 07/27/19 1015  . dextrose 50 % solution 50 mL  1 ampule Intravenous Once Kirby-Graham, Karsten Fells, NP   Stopped at 07/27/19 [phone]  . dorzolamide-timolol (COSOPT) 22.3-6.8 MG/ML ophthalmic solution 1 drop  1 drop Left  Eye BID Delray Alt, PA-C   1 drop at 07/27/19 0836  . enoxaparin (LOVENOX) injection 40 mg  40 mg Subcutaneous Q24H Patrecia Pace A, PA-C   40 mg at 07/27/19 0835  . HYDROmorphone (DILAUDID) injection 1 mg  1 mg Intravenous Q3H PRN Patrecia Pace A, PA-C   1 mg at 07/27/19 1008  . [START ON 07/28/2019] influenza vac split quadrivalent PF (FLUARIX) injection 0.5 mL  0.5 mL Intramuscular Tomorrow-1000 Little Ishikawa, MD      . insulin aspart (novoLOG) injection 0-9 Units  0-9 Units Subcutaneous TID WC & HS Little Ishikawa, MD   3 Units at 07/27/19 1207  . insulin detemir (LEVEMIR) injection 15 Units  15 Units Subcutaneous Daily Little Ishikawa, MD   15 Units at 07/27/19 0902  . isosorbide dinitrate (ISORDIL) tablet 20 mg  20 mg Oral BID Patrecia Pace A, PA-C   20 mg at 07/27/19 0901  . latanoprost (XALATAN) 0.005 % ophthalmic solution 1 drop  1 drop Both Eyes QPM Patrecia Pace A, PA-C   1 drop at 07/26/19 1737  . levothyroxine (SYNTHROID) tablet 50 mcg  50 mcg Oral Daily Patrecia Pace A, PA-C   50 mcg at 07/27/19 0550  . methocarbamol (ROBAXIN) tablet 500 mg  500 mg Oral Q6H PRN Delray Alt, PA-C      . multivitamin with minerals tablet 1 tablet  1 tablet Oral Daily Delray Alt, PA-C   1 tablet at 07/27/19 1017  . oxyCODONE (Oxy IR/ROXICODONE) immediate release tablet 5-15 mg  5-15 mg Oral Q4H PRN Patrecia Pace A, PA-C   15 mg at 07/27/19 0900  . prednisoLONE acetate (PRED FORTE) 1 % ophthalmic suspension 1 drop  1 drop Left Eye BID Delray Alt, PA-C   1 drop at 07/27/19 0835  . pregabalin (LYRICA) capsule 75 mg  75 mg Oral BID Delray Alt, PA-C   75 mg at 07/27/19 0834  . protein supplement (ENSURE MAX) liquid  11 oz Oral Daily Little Ishikawa, MD   11 oz at 07/27/19 1208  . sertraline (ZOLOFT) tablet 100 mg  100 mg Oral Daily Patrecia Pace A, PA-C   100 mg at 07/27/19 0836  . Vitamin D (Ergocalciferol) (DRISDOL) capsule 50,000 Units  50,000 Units Oral Q Toula Moos, PA-C   50,000 Units at 07/27/19 1015     Discharge Medications: Please see discharge summary for a list of discharge medications.  Relevant Imaging Results:  Relevant Lab Results:   Additional Information SSI [id number] , home oxygen 3 l [place] continuous  Jamaury Gumz, Edson Snowball, RN

## 2019-07-28 ENCOUNTER — Other Ambulatory Visit: Payer: Self-pay

## 2019-07-28 ENCOUNTER — Encounter (HOSPITAL_COMMUNITY): Payer: Self-pay | Admitting: General Practice

## 2019-07-28 LAB — BASIC METABOLIC PANEL
Anion gap: 13 (ref 5–15)
BUN: 42 mg/dL — ABNORMAL HIGH (ref 6–20)
CO2: 23 mmol/L (ref 22–32)
Calcium: 8.8 mg/dL — ABNORMAL LOW (ref 8.9–10.3)
Chloride: 102 mmol/L (ref 98–111)
Creatinine, Ser: 3.45 mg/dL — ABNORMAL HIGH (ref 0.44–1.00)
GFR calc Af Amer: 17 mL/min — ABNORMAL LOW (ref >60)
GFR calc non Af Amer: 15 mL/min — ABNORMAL LOW (ref >60)
Glucose, Bld: 198 mg/dL — ABNORMAL HIGH (ref 70–99)
Potassium: 4.8 mmol/L (ref 3.5–5.1)
Sodium: 138 mmol/L (ref 135–145)

## 2019-07-28 LAB — SARS CORONAVIRUS 2 (TAT 6-24 HRS): SARS Coronavirus 2: NEGATIVE

## 2019-07-28 LAB — CBC
HCT: 29.9 % — ABNORMAL LOW (ref 36.0–46.0)
Hemoglobin: 9.3 g/dL — ABNORMAL LOW (ref 12.0–15.0)
MCH: 27.7 pg (ref 26.0–34.0)
MCHC: 31.1 g/dL (ref 30.0–36.0)
MCV: 89 fL (ref 80.0–100.0)
Platelets: 184 10*3/uL (ref 150–400)
RBC: 3.36 MIL/uL — ABNORMAL LOW (ref 3.87–5.11)
RDW: 14.6 % (ref 11.5–15.5)
WBC: 6.9 10*3/uL (ref 4.0–10.5)
nRBC: 0 % (ref 0.0–0.2)

## 2019-07-28 LAB — GLUCOSE, CAPILLARY
Glucose-Capillary: 266 mg/dL — ABNORMAL HIGH (ref 70–99)
Glucose-Capillary: 262 mg/dL — ABNORMAL HIGH (ref 70–99)
Glucose-Capillary: 481 mg/dL — ABNORMAL HIGH (ref 70–99)
Glucose-Capillary: 209 mg/dL — ABNORMAL HIGH (ref 70–99)

## 2019-07-28 MED ORDER — HYDROMORPHONE HCL 1 MG/ML IJ SOLN
1.0000 mg | INTRAMUSCULAR | Status: DC | PRN
  Administered 2019-07-29: 1 mg via INTRAVENOUS
  Filled 2019-07-28: qty 1

## 2019-07-28 MED ORDER — KETOROLAC TROMETHAMINE 15 MG/ML IJ SOLN
15.0000 mg | Freq: Four times a day (QID) | INTRAMUSCULAR | Status: DC
  Administered 2019-07-28: 15 mg via INTRAVENOUS
  Filled 2019-07-28: qty 1

## 2019-07-28 MED ORDER — ENOXAPARIN SODIUM 30 MG/0.3ML ~~LOC~~ SOLN
30.0000 mg | SUBCUTANEOUS | Status: DC
  Administered 2019-07-28 – 2019-07-29 (×2): 30 mg via SUBCUTANEOUS
  Filled 2019-07-28 (×2): qty 0.3

## 2019-07-28 MED ORDER — SODIUM CHLORIDE 0.9 % IV SOLN
INTRAVENOUS | Status: DC
  Administered 2019-07-28 (×2): via INTRAVENOUS

## 2019-07-28 MED ORDER — INSULIN ASPART 100 UNIT/ML ~~LOC~~ SOLN: 12.0000 [IU] | Freq: Once | SUBCUTANEOUS | Status: DC

## 2019-07-28 NOTE — Progress Notes (Signed)
Physical Therapy Treatment Patient Details Name: Deanna Landry MRN: [id number] DOB: Apr 21, 1971 Today's Date: 07/28/2019    History of Present Illness Pt is a 48 y.o. female who fell at home sustaining R ankle fx. S/p R ankle ORIF 07/26/19. PMH includes DM type 1, blindness due to diabetic retinopathy, stroke, MI, HTN, CHF (reports baseline 3L O2 [place]).   PT Comments    Pt progressing with mobility. Unable to tolerate standing, but able to perform 2x squat pivot transfers with HHA and maxA. Pt remains limited by pain with any mobility, not able to tolerate wiggling toes. Educ on importance of mobility and RLE strength/ROM. Continue to recommend SNF-level therapies to maximize functional mobility and independence prior to return home.    Follow Up Recommendations  SNF;Supervision for mobility/OOB     Equipment Recommendations  (TBD next venue)    Recommendations for Other Services       Precautions / Restrictions Precautions Precautions: Fall Other Brace: CAM boot RLE Restrictions Weight Bearing Restrictions: Yes RLE Weight Bearing: Non weight bearing    Mobility  Bed Mobility Overal bed mobility: Modified Independent Bed Mobility: Supine to Sit     Supine to sit: Modified independent (Device/Increase time);HOB elevated     General bed mobility comments: Increased time and effort, reliant on HOB elevated and use of bed rail.  Transfers Overall transfer level: Needs assistance Equipment used: 1 person hand held assist Transfers: Squat Pivot Transfers           General transfer comment: Attempted to stand with RW, pt unable despite maxA. Able to perform squat pivot on LLE from bed>BSC>recliner with HHA and maxA  Ambulation/Gait             General Gait Details: unable   Stairs             Wheelchair Mobility    Modified Rankin (Stroke Patients Only)       Balance Overall balance assessment: Needs assistance   Sitting balance-Leahy Scale: Fair       Standing balance-Leahy Scale: Zero                              Cognition Arousal/Alertness: Awake/alert Behavior During Therapy: Anxious Overall Cognitive Status: No family/caregiver present to determine baseline cognitive functioning                                 General Comments: Anxious/distracted due to pain throughout session, able to intermittently be redirected. Pt thinking it was early morning, at times saying things that didn't make sense, "I told them I had to pee but figured it was too early in the morning." Eventually able to get to September 2020      Exercises Other Exercises Other Exercises: Pt unable to tolerate wiggling toes due to pain, minimal LAQ/SLR; educ on importance of RLE mobility, very distracted by wanting pain medicine    General Comments General comments (skin integrity, edema, etc.): Pt on RA upon entering room, SpO2 down to 74% on RA with activity; pt states wearing 3L O2 baseline; 3L O2 replaced and SpO2 up to 90-91%; encouraged pt to keep [place] on      Pertinent Vitals/Pain Pain Assessment: Faces Faces Pain Scale: Hurts even more Pain Location: RLE Pain Descriptors / Indicators: Constant;Throbbing Pain Intervention(s): Premedicated before session;Patient requesting pain meds-RN notified;Repositioned;Relaxation    Home Living  Prior Function            PT Goals (current goals can now be found in the care plan section) Progress towards PT goals: Progressing toward goals    Frequency    Min 3X/week      PT Plan Current plan remains appropriate    Co-evaluation              AM-PAC PT "6 Clicks" Mobility   Outcome Measure  Help needed turning from your back to your side while in a flat bed without using bedrails?: A Little Help needed moving from lying on your back to sitting on the side of a flat bed without using bedrails?: A Little Help needed moving to and from a bed  to a chair (including a wheelchair)?: A Lot Help needed standing up from a chair using your arms (e.g., wheelchair or bedside chair)?: A Lot Help needed to walk in hospital room?: Total Help needed climbing 3-5 steps with a railing? : Total 6 Click Score: 12    End of Session Equipment Utilized During Treatment: Gait belt;Oxygen Activity Tolerance: Patient limited by pain Patient left: in chair;with call bell/phone within reach;with chair alarm set Nurse Communication: Mobility status;Patient requests pain meds PT Visit Diagnosis: Other abnormalities of gait and mobility (R26.89);Pain Pain - Right/Left: Right Pain - part of body: Ankle and joints of foot     Time: [id number] PT Time Calculation (min) (ACUTE ONLY): 30 min  Charges:  $Therapeutic Activity: 23-37 mins                    Mabeline Caras, PT, DPT Acute Rehabilitation Services  Pager [phone] Office [place] 07/28/2019, 5:27 PM

## 2019-07-28 NOTE — TOC Progression Note (Signed)
Transition of Care [facility]) - Progression Note    Patient Details  Name: Deanna Landry MRN: [id number] Date of Birth: July 02, 1971  Transition of Care [facility]) CM/SW [place], [place] Phone Number: 07/28/2019, 10:46 AM  Clinical Narrative:    Followed up with [facility]. They will initiate insurance authorization; given that it is Friday and insurance company closed over weekend will likely be Monday before pt able to dc. Will need repeat COVID screen 24-48 hrs prior to discharge.    Expected Discharge Plan: [place] Barriers to Discharge: Continued Medical Work up, Ship broker  Expected Discharge Plan and Services Expected Discharge Plan: [place] In-house Referral: Clinical Social Work Discharge Planning Services: CM Consult Post Acute Care Choice: NA(unsure at this time) Living arrangements for the past 2 months: Single Family Home   Social Determinants of Health (SDOH) Interventions    Readmission Risk Interventions No flowsheet data found.

## 2019-07-28 NOTE — Progress Notes (Signed)
Orthopaedic Trauma Progress Note  S: Continuing to have significant pain in the right leg. States pain meds helping some but not a ton. Will add Toradol this morning. payient holding foot in plantarflexion during exam, weak dorsiflexion. Have order [facility] boot.   O:  Vitals:   07/27/19 2200 07/28/19 0502  BP:  (!) 153/57  Pulse:  84  Resp:  16  Temp:  98.5 F (36.9 C)  SpO2: 95% 98%    General - Laying in bed, NAD  Right Lower Extremity - Dressing and incisional vac removed. Incisions clean, dry, intact. Extremely tender with palpation over anterior ankle. Otherwise non-tender in foot or lower leg. Non-tender with compression of calf. Compartments soft and compressible. Holds foot in plantarflexion. Cannot actively dorsiflex ankle, pain with passive dorsiflexion. Diminished sensation in toes, has neuropathy at baseline. Otherwise, sensation intact to light touch of dorsal and plantar aspect of foot. Able to wiggle great toe slightly. Foot warm and well perfused.    Imaging: Stable post op imaging.   Labs:  Results for orders placed or performed during the hospital encounter of 07/25/19 (from the past 24 hour(s))  Glucose, capillary     Status: Abnormal   Collection Time: 07/27/19 11:50 AM  Result Value Ref Range   Glucose-Capillary 237 (H) 70 - 99 mg/dL  Basic metabolic panel     Status: Abnormal   Collection Time: 07/27/19 12:20 PM  Result Value Ref Range   Sodium 136 135 - 145 mmol/L   Potassium 4.7 3.5 - 5.1 mmol/L   Chloride 102 98 - 111 mmol/L   CO2 23 22 - 32 mmol/L   Glucose, Bld 257 (H) 70 - 99 mg/dL   BUN 37 (H) 6 - 20 mg/dL   Creatinine, Ser 2.67 (H) 0.44 - 1.00 mg/dL   Calcium 9.1 8.9 - 10.3 mg/dL   GFR calc non Af Amer 20 (L) >60 mL/min   GFR calc Af Amer 24 (L) >60 mL/min   Anion gap 11 5 - 15  Glucose, capillary     Status: Abnormal   Collection Time: 07/27/19  5:33 PM  Result Value Ref Range   Glucose-Capillary 196 (H) 70 - 99 mg/dL  Glucose, capillary      Status: Abnormal   Collection Time: 07/27/19  8:41 PM  Result Value Ref Range   Glucose-Capillary 217 (H) 70 - 99 mg/dL   Comment 1 Notify RN    Comment 2 Document in Chart   CBC     Status: Abnormal   Collection Time: 07/28/19  3:48 AM  Result Value Ref Range   WBC 6.9 4.0 - 10.5 K/uL   RBC 3.36 (L) 3.87 - 5.11 MIL/uL   Hemoglobin 9.3 (L) 12.0 - 15.0 g/dL   HCT 29.9 (L) 36.0 - 46.0 %   MCV 89.0 80.0 - 100.0 fL   MCH 27.7 26.0 - 34.0 pg   MCHC 31.1 30.0 - 36.0 g/dL   RDW 14.6 11.5 - 15.5 %   Platelets 184 150 - 400 K/uL   nRBC 0.0 0.0 - 0.2 %  Basic metabolic panel     Status: Abnormal   Collection Time: 07/28/19  3:48 AM  Result Value Ref Range   Sodium 138 135 - 145 mmol/L   Potassium 4.8 3.5 - 5.1 mmol/L   Chloride 102 98 - 111 mmol/L   CO2 23 22 - 32 mmol/L   Glucose, Bld 198 (H) 70 - 99 mg/dL   BUN 42 (H) 6 -  20 mg/dL   Creatinine, Ser 3.45 (H) 0.44 - 1.00 mg/dL   Calcium 8.8 (L) 8.9 - 10.3 mg/dL   GFR calc non Af Amer 15 (L) >60 mL/min   GFR calc Af Amer 17 (L) >60 mL/min   Anion gap 13 5 - 15    Assessment: 48 year old female s/p fall  Injuries: Right pilon fracture s/p ORIF  Weightbearing: NWB RLE  Insicional and dressing care: continue to change dressing PRN Orthopedic device(s): CAM walker RLE  Okay to have boot off while in bed, please put back on when up with therapy  CV/Blood loss: Acute blood loss anemia, Hgb 9.3. Hemodynamically stable  Pain management:  1. Tylenol 1000 mg q 6 hours scheduled 2. Robaxin 500 mg q 6 hours PRN 3. Oxycodone 5-15 mg q 4 hours PRN 4. Lyrica 75 mg BID 5. Dilaudid 1 mg q 2 hours PRN 6. Toradol 15 mg q 6 hours  VTE prophylaxis: Lovenox   ID:  Ancef 2gm post op completed  Foley/Lines:  No foley, KVO IVFs  Medical co-morbidities: diabetes mellitus type 2, hypertension, hypothyroidism, chronic kidney disease stage III, history of stroke, history of legal blindness  Impediments to Fracture Healing: Diabetes  Dispo: Up  with therapy, PT recommending SNF. Continue working on pain control and mobility. Would like patient to be a little more mobile before discharging. Recommend continuing Vitamin D supplementation at discharge  Follow - up plan: 2 weeks for suture removal and repeat x-rays    Satvik Parco A. Carmie Kanner [facility] ?[phone]? (phone)

## 2019-07-28 NOTE — Progress Notes (Signed)
Orthopedic Tech Progress Note Patient Details:  Deanna Landry 07/15/1971 [id number] Called in order to [facility] for an ANKLE FOOT ORTHOSIS  Patient ID: Deanna Landry, female   DOB: 1971/07/11, 48 y.o.   MRN: [id number]   [name] 07/28/2019, 11:31 AM

## 2019-07-28 NOTE — Progress Notes (Signed)
Inpatient Diabetes Program Recommendations  AACE/ADA: New Consensus Statement on Inpatient Glycemic Control (2015)  Target Ranges:  Prepandial:   less than 140 mg/dL      Peak postprandial:   less than 180 mg/dL (1-2 hours)      Critically ill patients:  140 - 180 mg/dL   Lab Results  Component Value Date   GLUCAP 266 (H) 07/28/2019   HGBA1C 6.8 (H) 03/10/2019    Review of Glycemic Control Results for Deanna Landry, Deanna Landry (MRN [id number]) as of 07/28/2019 10:45  Ref. Range 07/27/2019 07:41 07/27/2019 11:50 07/27/2019 17:33 07/27/2019 20:41 07/28/2019 08:19  Glucose-Capillary Latest Ref Range: 70 - 99 mg/dL 347 (H) 237 (H) 196 (H) 217 (H) 266 (H)    Diabetes history:Type 2 DM Outpatient Diabetes medications:Levemir 30 units Daily, Novolog 10 units tid, Trulicity 1.5 mg Q Monday Current orders for Inpatient glycemic control:  Novolog 0-9 units Q4H Lantus 15 units Decadron 4 mg x 1 9/9  Inpatient Diabetes Program Recommendations:    Fasting glucose 266.   Consider increasing Levemir closer to home dose, Levemir 22 units QHS.   Thanks, Tama Headings RN, MSN, BC-ADM Inpatient Diabetes Coordinator Team Pager [phone] (8a-5p)

## 2019-07-28 NOTE — TOC Progression Note (Signed)
Transition of Care [facility]) - Progression Note    Patient Details  Name: Deanna Landry MRN: [id number] Date of Birth: 01/11/1971  Transition of Care [facility]) CM/SW [place], [place] Phone Number: 07/28/2019, 4:43 PM  Clinical Narrative:    Pt auth received for SNF at [facility].  Have requested MD order a new COVID screen, if stable can d/c 9/12.   Expected Discharge Plan: [place] Barriers to Discharge: Continued Medical Work up, Ship broker  Expected Discharge Plan and Services Expected Discharge Plan: [place] In-house Referral: Clinical Social Work Discharge Planning Services: CM Consult Post Acute Care Choice: NA(unsure at this time) Living arrangements for the past 2 months: Single Family Home     Social Determinants of Health (SDOH) Interventions    Readmission Risk Interventions No flowsheet data found.

## 2019-07-28 NOTE — Progress Notes (Signed)
CRITICAL VALUE ALERT  Critical Value:  cbg 481  Date & Time Notied:  07/28/2019 at [place]  Provider Notified: Dr. Avon Gully  Orders Received/Actions taken: given 9 units of insulin , still waiting for new orders, pt alert and oriented, will continue to mintor s/s of hyperglycemia.

## 2019-07-28 NOTE — Progress Notes (Signed)
Progress Note   Daleyssa D Latimore [id number] DOB: [birth year] DOA: 07/25/2019  PCP: Cher Nakai, MD  Patient coming from: Patient was transferred from [facility].  Chief Complaint: Right ankle fracture.  HPI: Deanna Landry is a 48 y.o. female with history of diabetes mellitus type 2, hypertension, hypothyroidism, chronic kidney disease stage III, history of stroke history of legal blindness had a fall at home while patient was in the kitchen at around 4 AM yesterday.  Patient states he suddenly stumbled and fell but did not hit her head or lose consciousness.  Patient was feeling diaphoretic and weak just like hypoglycemic episode.  Since patient had persistent pain in the right ankle since the fall patient decided to come to the ER later in the evening.  X-rays revealed right trimalleolar fracture of the ankle and orthopedic surgery was not comfortable at [facility] and transferred to [facility].  Dr. Stann Mainland will be the accepting physician for orthopedics at [facility].  On my exam patient denies any chest pain or shortness of breath palpitations nausea vomiting or diarrhea.  Patient does not recall her last blood sugar.  Labs reviewed at [facility].   Subjective: No acute issues or events overnight, patient continues to complain of uncontrolled pain, unclear if patient's expectations are inappropriate given she is somewhat somnolent on current regimen.  Otherwise declines chest pain, shortness of breath, nausea, vomiting, diarrhea, constipation, headache, fever, chills.  Assessment/Plan Principal Problem:   Closed right pilon fracture, initial encounter Active Problems:   Diabetes mellitus type 2 in obese [facility])   Hypertension   Acute closed right ankle fracture trimalleolar  -Orthopedic surgery following, ORIF 07/26/2019, tolerated well -Pain control/DVT prophylaxis per orthopedic surgery -PT OT ongoing, possible need for placement pending clinical improvement  Diabetes  mellitus type 2  -Continue sliding scale coverage, hypoglycemic protocol  -Likely to resume patient's home Levemir -typically 30 units at bedtime  Hyperkalemia improving -Potassium 6.7 previously status post Kayexalate -Appears to be well controlled now, follow with morning labs  Hypertension, essential -Continue amlodipine and Isordil.  CVA history  -Continue atorvastatin and Plavix postoperatively if okay with orthopedics/pending DVT prophylaxis  Mild AKI on Chronic disease stage III, minimally worsening -baseline around 2, creatinine currently 3.4 -Initiate IV fluids, patient instructed to increase p.o. free water intake -mitts to poor diet perioperatively -If creatinine continues to worsen despite IV fluids would consider nephrology consult to further evaluate secondary causes -urine output poorly measured over the past 48 hours, will order strict I's and O's at this time to ensure adequate urine output (less than 200 cc total measured since admission, likely not accurate).  Normocytic anemia, likely anemia of chronic disease given above Baseline around 10  Follow a.m. labs  DVT prophylaxis: Lovenox per Sx for chemical ppx Code Status: Full code. Disposition Plan: Pending clinical improvement, PT OT evaluation and improvement in pain and ambulatory status Consults called: Orthopedics. Admission status: Inpatient pending postsurgical improvement and ongoing physical therapy recommendations  Physical Exam:  Vitals:   07/27/19 1405 07/27/19 2038 07/27/19 2200 07/28/19 0502  BP: (!) 95/51 92/60  (!) 153/57  Pulse: 70 73  84  Resp: 14 15  16   Temp: 98.9 F (37.2 C) 98.6 F (37 C)  98.5 F (36.9 C)  TempSrc: Oral Oral  Oral  SpO2: 92% 94% 95% 98%  Weight:      Height:       ENMT: No discharge from the ears eyes nose or mouth. Neck:  No mass felt.  No neck rigidity. Respiratory: No rhonchi or crepitations. Cardiovascular: S1-S2 heard. Abdomen: Soft nontender bowel sounds  present. Musculoskeletal: Right leg is in the brace, wound VAC intact. Skin: No rash. Neurologic: Alert awake oriented to time place and person.  Moves all extremities.  Labs on Admission: I have personally reviewed following labs and imaging studies  CBC: Recent Labs  Lab 07/26/19 0136 07/27/19 0216 07/28/19 0348  WBC 5.0 4.7 6.9  NEUTROABS 3.4  --   --   HGB 11.6* 9.3* 9.3*  HCT 36.5 29.9* 29.9*  MCV 86.7 87.9 89.0  PLT 220 174 [id number]   Basic Metabolic Panel: Recent Labs  Lab 07/26/19 0136 07/27/19 0216 07/27/19 1220 07/28/19 0348  NA 138 133* 136 138  K 5.2* 6.7* 4.7 4.8  CL 105 102 102 102  CO2 24 23 23 23   GLUCOSE 190* 324* 257* 198*  BUN 28* 36* 37* 42*  CREATININE 2.29* 2.34* 2.67* 3.45*  CALCIUM 9.1 8.4* 9.1 8.8*   GFR: Estimated Creatinine Clearance: 20.3 mL/min (A) (by C-G formula based on SCr of 3.45 mg/dL (H)). Liver Function Tests: Recent Labs  Lab 07/26/19 0136  AST 17  ALT 14  ALKPHOS 112  BILITOT 0.4  PROT 7.0  ALBUMIN 3.5   CBG: Recent Labs  Lab 07/26/19 2018 07/27/19 0741 07/27/19 1150 07/27/19 1733 07/27/19 2041  GLUCAP 255* 347* 237* 196* 217*   Urine analysis:    Component Value Date/Time   COLORURINE YELLOW 03/10/2019 0745   APPEARANCEUR CLEAR 03/10/2019 0745   LABSPEC 1.010 03/10/2019 0745   PHURINE 5.0 03/10/2019 0745   GLUCOSEU >=500 (A) 03/10/2019 0745   HGBUR NEGATIVE 03/10/2019 0745   BILIRUBINUR NEGATIVE 03/10/2019 0745   KETONESUR NEGATIVE 03/10/2019 0745   PROTEINUR NEGATIVE 03/10/2019 0745   NITRITE NEGATIVE 03/10/2019 0745   LEUKOCYTESUR TRACE (A) 03/10/2019 0745    Recent Results (from the past 240 hour(s))  SARS CORONAVIRUS 2 (TAT 6-24 HRS) Nasopharyngeal Nasopharyngeal Swab     Status: None   Collection Time: 07/26/19 12:17 AM   Specimen: Nasopharyngeal Swab  Result Value Ref Range Status   SARS Coronavirus 2 NEGATIVE NEGATIVE Final    Comment: (NOTE) SARS-CoV-2 target nucleic acids are NOT DETECTED.  The SARS-CoV-2 RNA is generally detectable in upper and lower respiratory specimens during the acute phase of infection. Negative results do not preclude SARS-CoV-2 infection, do not rule out co-infections with other pathogens, and should not be used as the sole basis for treatment or other patient management decisions. Negative results must be combined with clinical observations, patient history, and epidemiological information. The expected result is Negative. Fact Sheet for Patients: SugarRoll.be Fact Sheet for Healthcare Providers: https://www.woods-mathews.com/ This test is not yet approved or cleared by the [place] FDA and  has been authorized for detection and/or diagnosis of SARS-CoV-2 by FDA under an Emergency Use Authorization (EUA). This EUA will remain  in effect (meaning this test can be used) for the duration of the COVID-19 declaration under Section 56 4(b)(1) of the Act, 21 U.S.C. section 360bbb-3(b)(1), unless the authorization is terminated or revoked sooner. Performed at [facility], [place] [address], [place], [place] [address]   Surgical pcr screen     Status: None   Collection Time: 07/26/19 12:42 AM   Specimen: Nasal Mucosa; Nasal Swab  Result Value Ref Range Status   MRSA, PCR NEGATIVE NEGATIVE Final   Staphylococcus aureus NEGATIVE NEGATIVE Final    Comment: (NOTE) The Xpert SA  Assay (FDA approved for NASAL specimens in patients 98 years of age and older), is one component of a comprehensive surveillance program. It is not intended to diagnose infection nor to guide or monitor treatment. Performed at [facility], [place] [address], [place], [place] [address]      Radiological Exams on Admission: Dg Ankle Complete Right  Result Date: 07/26/2019 CLINICAL DATA:  ORIF ankle fracture EXAM: RIGHT ANKLE - COMPLETE 3+ VIEW; DG C-ARM 1-60 MIN COMPARISON:  CT right ankle dated 07/26/2019 FLUOROSCOPY TIME:   2 minutes 1 second FINDINGS: Intraoperative fluoroscopic radiographs during lateral compression plate and screw fixation of the distal fibula with placement of a syndesmotic screw. Two cannulated cancellous screws transfix the medial malleolus. Two additional screws transfix the posterior malleolar fracture. Fracture fragments are in near anatomic alignment and position. IMPRESSION: Intraoperative fluoroscopic radiographs during ORIF of a trimalleolar ankle fracture, as above. Electronically Signed   By: Julian Hy M.D.   On: 07/26/2019 17:21   Ct Ankle Right Wo Contrast  Result Date: 07/26/2019 CLINICAL DATA:  Status post fall, right ankle fracture EXAM: CT OF THE RIGHT ANKLE WITHOUT CONTRAST TECHNIQUE: Multidetector CT imaging of the right ankle was performed according to the standard protocol. Multiplanar CT image reconstructions were also generated. COMPARISON:  None. FINDINGS: Bones/Joint/Cartilage Comminuted fracture of the distal tibial metaphysis extending to the articular surface with the fracture extending into the medial malleolus and involving the posterior malleolus. 6 mm of posterior displacement of the posterior malleolus. Narrowing of the medial tibiotalar joint. Comminuted fracture of the distal fibular diametaphysis with 9 mm of posterior displacement. No other acute fracture or dislocation. No aggressive osseous lesion. Small plantar calcaneal spur. Small ankle joint effusion. Ligaments Ligaments are suboptimally evaluated by CT. Muscles and Tendons Muscles are normal. Flexor, extensor, peroneal and Achilles tendons are intact. Soft tissue No fluid collection or hematoma.  No soft tissue mass. IMPRESSION: 1. Trimalleolar right ankle fracture as detailed above. Electronically Signed   By: Kathreen Devoid   On: 07/26/2019 10:51   Dg Ankle Right Port  Result Date: 07/26/2019 CLINICAL DATA:  Postop right ankle fracture EXAM: PORTABLE RIGHT ANKLE - 2 VIEW COMPARISON:  CT right ankle dated  07/26/2019 FINDINGS: Lateral compression plate and screw fixation of a distal fibular fracture with placement of a syndesmotic screw. Two cannulated cancellous screws transfix the medial malleolus. Two additional screws transfix the posterior malleolar fracture. Fracture fragments are in near anatomic alignment and position. Mild soft tissue swelling. IMPRESSION: Status post ORIF of a trimalleolar ankle fracture, as above. Electronically Signed   By: Julian Hy M.D.   On: 07/26/2019 17:23   Dg C-arm 1-60 Min  Result Date: 07/26/2019 CLINICAL DATA:  ORIF ankle fracture EXAM: RIGHT ANKLE - COMPLETE 3+ VIEW; DG C-ARM 1-60 MIN COMPARISON:  CT right ankle dated 07/26/2019 FLUOROSCOPY TIME:  2 minutes 1 second FINDINGS: Intraoperative fluoroscopic radiographs during lateral compression plate and screw fixation of the distal fibula with placement of a syndesmotic screw. Two cannulated cancellous screws transfix the medial malleolus. Two additional screws transfix the posterior malleolar fracture. Fracture fragments are in near anatomic alignment and position. IMPRESSION: Intraoperative fluoroscopic radiographs during ORIF of a trimalleolar ankle fracture, as above. Electronically Signed   By: Julian Hy M.D.   On: 07/26/2019 17:21    EKG: Independently reviewed.  Normal sinus rhythm.   Little Ishikawa DO [facility]  Pager: [email or web address] Password [facility]  07/28/2019, 8:05 AM

## 2019-07-29 DIAGNOSIS — I252 Old myocardial infarction: Secondary | ICD-10-CM | POA: Diagnosis not present

## 2019-07-29 DIAGNOSIS — I5042 Chronic combined systolic (congestive) and diastolic (congestive) heart failure: Secondary | ICD-10-CM | POA: Diagnosis not present

## 2019-07-29 DIAGNOSIS — S82871A Displaced pilon fracture of right tibia, initial encounter for closed fracture: Secondary | ICD-10-CM | POA: Diagnosis not present

## 2019-07-29 DIAGNOSIS — H04123 Dry eye syndrome of bilateral lacrimal glands: Secondary | ICD-10-CM | POA: Diagnosis not present

## 2019-07-29 DIAGNOSIS — I10 Essential (primary) hypertension: Secondary | ICD-10-CM | POA: Diagnosis not present

## 2019-07-29 DIAGNOSIS — N183 Chronic kidney disease, stage 3 unspecified: Secondary | ICD-10-CM | POA: Diagnosis not present

## 2019-07-29 DIAGNOSIS — S82891D Other fracture of right lower leg, subsequent encounter for closed fracture with routine healing: Secondary | ICD-10-CM | POA: Diagnosis not present

## 2019-07-29 DIAGNOSIS — I509 Heart failure, unspecified: Secondary | ICD-10-CM | POA: Diagnosis not present

## 2019-07-29 DIAGNOSIS — M25571 Pain in right ankle and joints of right foot: Secondary | ICD-10-CM | POA: Diagnosis not present

## 2019-07-29 DIAGNOSIS — R5381 Other malaise: Secondary | ICD-10-CM | POA: Diagnosis not present

## 2019-07-29 DIAGNOSIS — H548 Legal blindness, as defined in USA: Secondary | ICD-10-CM | POA: Diagnosis not present

## 2019-07-29 DIAGNOSIS — R262 Difficulty in walking, not elsewhere classified: Secondary | ICD-10-CM | POA: Diagnosis not present

## 2019-07-29 DIAGNOSIS — R58 Hemorrhage, not elsewhere classified: Secondary | ICD-10-CM | POA: Diagnosis not present

## 2019-07-29 DIAGNOSIS — S82851D Displaced trimalleolar fracture of right lower leg, subsequent encounter for closed fracture with routine healing: Secondary | ICD-10-CM | POA: Diagnosis not present

## 2019-07-29 DIAGNOSIS — Z7401 Bed confinement status: Secondary | ICD-10-CM | POA: Diagnosis not present

## 2019-07-29 DIAGNOSIS — M6281 Muscle weakness (generalized): Secondary | ICD-10-CM | POA: Diagnosis not present

## 2019-07-29 DIAGNOSIS — W19XXXD Unspecified fall, subsequent encounter: Secondary | ICD-10-CM | POA: Diagnosis not present

## 2019-07-29 DIAGNOSIS — F419 Anxiety disorder, unspecified: Secondary | ICD-10-CM | POA: Diagnosis not present

## 2019-07-29 DIAGNOSIS — S82891A Other fracture of right lower leg, initial encounter for closed fracture: Secondary | ICD-10-CM | POA: Diagnosis not present

## 2019-07-29 DIAGNOSIS — I959 Hypotension, unspecified: Secondary | ICD-10-CM | POA: Diagnosis not present

## 2019-07-29 DIAGNOSIS — E1165 Type 2 diabetes mellitus with hyperglycemia: Secondary | ICD-10-CM | POA: Diagnosis not present

## 2019-07-29 DIAGNOSIS — F3289 Other specified depressive episodes: Secondary | ICD-10-CM | POA: Diagnosis not present

## 2019-07-29 DIAGNOSIS — L97319 Non-pressure chronic ulcer of right ankle with unspecified severity: Secondary | ICD-10-CM | POA: Diagnosis not present

## 2019-07-29 DIAGNOSIS — M2550 Pain in unspecified joint: Secondary | ICD-10-CM | POA: Diagnosis not present

## 2019-07-29 DIAGNOSIS — S82871D Displaced pilon fracture of right tibia, subsequent encounter for closed fracture with routine healing: Secondary | ICD-10-CM | POA: Diagnosis not present

## 2019-07-29 DIAGNOSIS — E119 Type 2 diabetes mellitus without complications: Secondary | ICD-10-CM | POA: Diagnosis not present

## 2019-07-29 DIAGNOSIS — R52 Pain, unspecified: Secondary | ICD-10-CM | POA: Diagnosis not present

## 2019-07-29 DIAGNOSIS — E039 Hypothyroidism, unspecified: Secondary | ICD-10-CM | POA: Diagnosis not present

## 2019-07-29 LAB — CBC
HCT: 27 % — ABNORMAL LOW (ref 36.0–46.0)
Hemoglobin: 8.5 g/dL — ABNORMAL LOW (ref 12.0–15.0)
MCH: 27.6 pg (ref 26.0–34.0)
MCHC: 31.5 g/dL (ref 30.0–36.0)
MCV: 87.7 fL (ref 80.0–100.0)
Platelets: 162 10*3/uL (ref 150–400)
RBC: 3.08 MIL/uL — ABNORMAL LOW (ref 3.87–5.11)
RDW: 14.5 % (ref 11.5–15.5)
WBC: 6.2 10*3/uL (ref 4.0–10.5)
nRBC: 0 % (ref 0.0–0.2)

## 2019-07-29 LAB — BASIC METABOLIC PANEL
Anion gap: 9 (ref 5–15)
BUN: 36 mg/dL — ABNORMAL HIGH (ref 6–20)
CO2: 24 mmol/L (ref 22–32)
Calcium: 8.5 mg/dL — ABNORMAL LOW (ref 8.9–10.3)
Chloride: 105 mmol/L (ref 98–111)
Creatinine, Ser: 2.26 mg/dL — ABNORMAL HIGH (ref 0.44–1.00)
GFR calc Af Amer: 29 mL/min — ABNORMAL LOW (ref >60)
GFR calc non Af Amer: 25 mL/min — ABNORMAL LOW (ref >60)
Glucose, Bld: 171 mg/dL — ABNORMAL HIGH (ref 70–99)
Potassium: 4.6 mmol/L (ref 3.5–5.1)
Sodium: 138 mmol/L (ref 135–145)

## 2019-07-29 LAB — GLUCOSE, CAPILLARY
Glucose-Capillary: 231 mg/dL — ABNORMAL HIGH (ref 70–99)
Glucose-Capillary: 260 mg/dL — ABNORMAL HIGH (ref 70–99)
Glucose-Capillary: 282 mg/dL — ABNORMAL HIGH (ref 70–99)

## 2019-07-29 MED ORDER — VITAMIN D (ERGOCALCIFEROL) 1.25 MG (50000 UNIT) PO CAPS: 50000.0000 [IU] | ORAL_CAPSULE | ORAL | 0 refills | Status: AC

## 2019-07-29 NOTE — Progress Notes (Signed)
Discharge to [facility] care transported by [facility].

## 2019-07-29 NOTE — Progress Notes (Signed)
Report givrn to [employer], Therapist, sports at [facility]

## 2019-07-29 NOTE — Discharge Summary (Signed)
Physician Discharge Summary  Deanna Landry [id number] DOB: [birth year] DOA: 07/25/2019  PCP: Cher Nakai, MD  Admit date: 07/25/2019 Discharge date: 07/29/2019  Admitted From: Home Disposition: SNF  Recommendations for Outpatient Follow-up:  1. Follow up with PCP in 1-2 weeks, orthopedics as scheduled 2. Please obtain BMP/CBC in one week  Discharge Condition: Stable CODE STATUS: Full Diet recommendation: As tolerated  Brief/Interim Summary: Deanna Landry is a 48 y.o. female with history of diabetes mellitus type 2, hypertension, hypothyroidism, chronic kidney disease stage III, history of stroke history of legal blindness had a fall at home while patient was in the kitchen at around 4 AM yesterday.  Patient states he suddenly stumbled and fell but did not hit her head or lose consciousness.  Patient was feeling diaphoretic and weak just like hypoglycemic episode.  Since patient had persistent pain in the right ankle since the fall patient decided to come to the ER later in the evening.  X-rays revealed right trimalleolar fracture of the ankle and orthopedic surgery was not comfortable at [facility] and transferred to [facility].  Dr. Stann Mainland will be the accepting physician for orthopedics at [facility].  On my exam patient denies any chest pain or shortness of breath palpitations nausea vomiting or diarrhea.  Patient does not recall her last blood sugar.  Labs reviewed at [facility].  Patient admitted as above with trimalleolar right ankle fracture requiring orthopedic surgery.  Patient tolerated procedure quite well on 07/26/2019.  Ongoing PT somewhat limited due to pain but improving daily.  Currently being evaluated for SNF placement for ongoing physical therapy treatment and evaluation.  Pain remains moderately well controlled on current regimen, unfortunately patient's expectations are to be pain-free.  Orthopedics to follow for DVT prophylaxis and pain control per their expertise  -although would recommend against NSAIDs given CKD. She has insulin-dependent diabetes which continues to be somewhat poorly controlled, back on home insulin, likely to be more controlled with improved diet.  Patient was moderately hyperkalemic at admission potassium was 6.7 now within normal limits, blood pressure otherwise well controlled on home regimen.  Patient did have minimally elevated creatinine at admission this too is downtrending with IV fluids and increased p.o. intake, likely hypovolemic in nature perioperatively due to poor p.o. intake and pain.  Patient otherwise stable and agreeable for discharge.  Discharge Diagnoses:  Principal Problem:   Closed right pilon fracture, initial encounter Active Problems:   Diabetes mellitus type 2 in obese [facility])   Hypertension  Discharge Instructions  Allergies as of 07/29/2019   No Known Allergies     Medication List    TAKE these medications   Accu-Chek Aviva Plus test strip Generic drug: glucose blood USE TO CHECK BLOOD SUGAR 4 TIMES A DAY   amLODipine 10 MG tablet Commonly known as: NORVASC Take 10 mg by mouth daily.   aspirin EC 81 MG tablet Take 81 mg by mouth daily.   atorvastatin 80 MG tablet Commonly known as: LIPITOR Take 80 mg by mouth daily.   atropine 1 % ophthalmic solution Place 1 drop into the right eye 2 (two) times daily.   buPROPion 150 MG 24 hr tablet Commonly known as: WELLBUTRIN XL Take 150 mg by mouth daily.   clopidogrel 75 MG tablet Commonly known as: PLAVIX Take 75 mg by mouth daily.   dorzolamide-timolol 22.3-6.8 MG/ML ophthalmic solution Commonly known as: COSOPT Place 1 drop into both eyes 2 (two) times daily.   isosorbide dinitrate 20 MG  tablet Commonly known as: ISORDIL Take 20 mg by mouth 2 (two) times daily.   latanoprost 0.005 % ophthalmic solution Commonly known as: XALATAN Place 1 drop into the left eye every evening.   Levemir 100 UNIT/ML injection Generic drug: insulin  detemir Inject 30 Units into the skin 2 (two) times daily.   levothyroxine 50 MCG tablet Commonly known as: SYNTHROID Take 50 mcg by mouth daily.   liver oil-zinc oxide 40 % ointment Commonly known as: DESITIN Apply topically as needed for irritation.   CVS Zinc Oxide 20 % ointment Generic drug: zinc oxide Apply 1 application topically as needed for irritation.   metoCLOPramide 5 MG tablet Commonly known as: REGLAN Take 1 tablet (5 mg total) by mouth 3 (three) times daily before meals.   NovoLOG FlexPen 100 UNIT/ML FlexPen Generic drug: insulin aspart Inject 10 Units into the skin 3 (three) times daily.   ondansetron 4 MG tablet Commonly known as: ZOFRAN Take 1 tablet (4 mg total) by mouth every 6 (six) hours as needed for nausea.   pantoprazole 40 MG tablet Commonly known as: PROTONIX Take 40 mg by mouth daily.   prednisoLONE acetate 1 % ophthalmic suspension Commonly known as: PRED FORTE Place 1 drop into the right eye 2 (two) times daily.   pregabalin 75 MG capsule Commonly known as: LYRICA Take 75 mg by mouth 2 (two) times daily.   sertraline 100 MG tablet Commonly known as: ZOLOFT Take 100 mg by mouth daily.   Systane 0.4-0.3 % Gel ophthalmic gel Generic drug: Polyethyl Glycol-Propyl Glycol Place 1 application into both eyes 3 (three) times daily as needed (dry eye).   Trulicity 1.5 0000000 Sopn Generic drug: Dulaglutide Inject 1.5 mg into the skin every Monday.      Contact information for after-discharge care    Destination    [facility] Preferred SNF .   Service: Skilled Nursing Contact information: [address] [place] [phone]             No Known Allergies  Consultations:  Orthopedic surgery Dr. Doreatha Martin  Procedures/Studies: Dg Ankle Complete Right  Result Date: 07/26/2019 CLINICAL DATA:  ORIF ankle fracture EXAM: RIGHT ANKLE - COMPLETE 3+ VIEW; DG C-ARM 1-60 MIN COMPARISON:  CT right  ankle dated 07/26/2019 FLUOROSCOPY TIME:  2 minutes 1 second FINDINGS: Intraoperative fluoroscopic radiographs during lateral compression plate and screw fixation of the distal fibula with placement of a syndesmotic screw. Two cannulated cancellous screws transfix the medial malleolus. Two additional screws transfix the posterior malleolar fracture. Fracture fragments are in near anatomic alignment and position. IMPRESSION: Intraoperative fluoroscopic radiographs during ORIF of a trimalleolar ankle fracture, as above. Electronically Signed   By: Julian Hy M.D.   On: 07/26/2019 17:21   Ct Ankle Right Wo Contrast  Result Date: 07/26/2019 CLINICAL DATA:  Status post fall, right ankle fracture EXAM: CT OF THE RIGHT ANKLE WITHOUT CONTRAST TECHNIQUE: Multidetector CT imaging of the right ankle was performed according to the standard protocol. Multiplanar CT image reconstructions were also generated. COMPARISON:  None. FINDINGS: Bones/Joint/Cartilage Comminuted fracture of the distal tibial metaphysis extending to the articular surface with the fracture extending into the medial malleolus and involving the posterior malleolus. 6 mm of posterior displacement of the posterior malleolus. Narrowing of the medial tibiotalar joint. Comminuted fracture of the distal fibular diametaphysis with 9 mm of posterior displacement. No other acute fracture or dislocation. No aggressive osseous lesion. Small plantar calcaneal spur. Small ankle joint effusion.  Ligaments Ligaments are suboptimally evaluated by CT. Muscles and Tendons Muscles are normal. Flexor, extensor, peroneal and Achilles tendons are intact. Soft tissue No fluid collection or hematoma.  No soft tissue mass. IMPRESSION: 1. Trimalleolar right ankle fracture as detailed above. Electronically Signed   By: Kathreen Devoid   On: 07/26/2019 10:51   Chest Portable 1 View  Result Date: 07/26/2019 CLINICAL DATA:  Preop for right foot surgery EXAM: PORTABLE CHEST 1 VIEW  COMPARISON:  03/12/2019 FINDINGS: Low volume chest with streaky density at both lung bases-seen to some degree on prior. No acute airspace disease. No pleural fluid or pneumothorax. Borderline heart size accentuated by low volumes. IMPRESSION: Low volume chest with atelectasis or scarring in the lower lobes. Electronically Signed   By: Monte Fantasia M.D.   On: 07/26/2019 07:05   Dg Ankle Right Port  Result Date: 07/26/2019 CLINICAL DATA:  Postop right ankle fracture EXAM: PORTABLE RIGHT ANKLE - 2 VIEW COMPARISON:  CT right ankle dated 07/26/2019 FINDINGS: Lateral compression plate and screw fixation of a distal fibular fracture with placement of a syndesmotic screw. Two cannulated cancellous screws transfix the medial malleolus. Two additional screws transfix the posterior malleolar fracture. Fracture fragments are in near anatomic alignment and position. Mild soft tissue swelling. IMPRESSION: Status post ORIF of a trimalleolar ankle fracture, as above. Electronically Signed   By: Julian Hy M.D.   On: 07/26/2019 17:23   Dg C-arm 1-60 Min  Result Date: 07/26/2019 CLINICAL DATA:  ORIF ankle fracture EXAM: RIGHT ANKLE - COMPLETE 3+ VIEW; DG C-ARM 1-60 MIN COMPARISON:  CT right ankle dated 07/26/2019 FLUOROSCOPY TIME:  2 minutes 1 second FINDINGS: Intraoperative fluoroscopic radiographs during lateral compression plate and screw fixation of the distal fibula with placement of a syndesmotic screw. Two cannulated cancellous screws transfix the medial malleolus. Two additional screws transfix the posterior malleolar fracture. Fracture fragments are in near anatomic alignment and position. IMPRESSION: Intraoperative fluoroscopic radiographs during ORIF of a trimalleolar ankle fracture, as above. Electronically Signed   By: Julian Hy M.D.   On: 07/26/2019 17:21    Subjective: No acute issues or events overnight, patient still complains of pain but moderately improved from previous.  Otherwise  tolerating p.o. quite well, stable and agreeable for discharge as above.  No chest pain, shortness of breath, nausea, vomiting, diarrhea, constipation, headache, fevers, chills.  Discharge Exam: Vitals:   07/28/19 2053 07/29/19 0528  BP: (!) 128/57 (!) 132/56  Pulse: 81 88  Resp: 18 18  Temp: 98.9 F (37.2 C) 99.4 F (37.4 C)  SpO2: 93% 96%   Vitals:   07/28/19 0502 07/28/19 1245 07/28/19 2053 07/29/19 0528  BP: (!) 153/57 (!) 144/56 (!) 128/57 (!) 132/56  Pulse: 84 90 81 88  Resp: 16 20 18 18   Temp: 98.5 F (36.9 C) 98.4 F (36.9 C) 98.9 F (37.2 C) 99.4 F (37.4 C)  TempSrc: Oral Oral Oral Oral  SpO2: 98% 96% 93% 96%  Weight:      Height:        General:  Pleasantly resting in bed, No acute distress. HEENT:  Normocephalic atraumatic.  Sclerae nonicteric, noninjected.  Extraocular movements intact bilaterally. Neck:  Without mass or deformity.  Trachea is midline. Lungs:  Clear to auscultate bilaterally without rhonchi, wheeze, or rales. Heart:  Regular rate and rhythm.  Without murmurs, rubs, or gallops. Abdomen:  Soft, nontender, nondistended.  Without guarding or rebound. Extremities: Right ankle bandage clean dry intact, currently in boot. Vascular:  Dorsalis pedis and posterior tibial pulses palpable bilaterally. Skin:  Warm and dry, no erythema, no ulcerations.   The results of significant diagnostics from this hospitalization (including imaging, microbiology, ancillary and laboratory) are listed below for reference.     Microbiology: Recent Results (from the past 240 hour(s))  SARS CORONAVIRUS 2 (TAT 6-24 HRS) Nasopharyngeal Nasopharyngeal Swab     Status: None   Collection Time: 07/26/19 12:17 AM   Specimen: Nasopharyngeal Swab  Result Value Ref Range Status   SARS Coronavirus 2 NEGATIVE NEGATIVE Final    Comment: (NOTE) SARS-CoV-2 target nucleic acids are NOT DETECTED. The SARS-CoV-2 RNA is generally detectable in upper and lower respiratory specimens  during the acute phase of infection. Negative results do not preclude SARS-CoV-2 infection, do not rule out co-infections with other pathogens, and should not be used as the sole basis for treatment or other patient management decisions. Negative results must be combined with clinical observations, patient history, and epidemiological information. The expected result is Negative. Fact Sheet for Patients: SugarRoll.be Fact Sheet for Healthcare Providers: https://www.woods-mathews.com/ This test is not yet approved or cleared by the [place] FDA and  has been authorized for detection and/or diagnosis of SARS-CoV-2 by FDA under an Emergency Use Authorization (EUA). This EUA will remain  in effect (meaning this test can be used) for the duration of the COVID-19 declaration under Section 56 4(b)(1) of the Act, 21 U.S.C. section 360bbb-3(b)(1), unless the authorization is terminated or revoked sooner. Performed at [facility], [place] [address] [place], [place] [address]   Surgical pcr screen     Status: None   Collection Time: 07/26/19 12:42 AM   Specimen: Nasal Mucosa; Nasal Swab  Result Value Ref Range Status   MRSA, PCR NEGATIVE NEGATIVE Final   Staphylococcus aureus NEGATIVE NEGATIVE Final    Comment: (NOTE) The Xpert SA Assay (FDA approved for NASAL specimens in patients 78 years of age and older), is one component of a comprehensive surveillance program. It is not intended to diagnose infection nor to guide or monitor treatment. Performed at [facility], [place] [address] [place], [place] [address]   SARS CORONAVIRUS 2 (TAT 6-24 HRS) Nasopharyngeal Nasopharyngeal Swab     Status: None   Collection Time: 07/28/19  4:47 PM   Specimen: Nasopharyngeal Swab  Result Value Ref Range Status   SARS Coronavirus 2 NEGATIVE NEGATIVE Final    Comment: (NOTE) SARS-CoV-2 target nucleic acids are NOT DETECTED. The SARS-CoV-2 RNA is  generally detectable in upper and lower respiratory specimens during the acute phase of infection. Negative results do not preclude SARS-CoV-2 infection, do not rule out co-infections with other pathogens, and should not be used as the sole basis for treatment or other patient management decisions. Negative results must be combined with clinical observations, patient history, and epidemiological information. The expected result is Negative. Fact Sheet for Patients: SugarRoll.be Fact Sheet for Healthcare Providers: https://www.woods-mathews.com/ This test is not yet approved or cleared by the [place] FDA and  has been authorized for detection and/or diagnosis of SARS-CoV-2 by FDA under an Emergency Use Authorization (EUA). This EUA will remain  in effect (meaning this test can be used) for the duration of the COVID-19 declaration under Section 56 4(b)(1) of the Act, 21 U.S.C. section 360bbb-3(b)(1), unless the authorization is terminated or revoked sooner. Performed at [facility], [place] [address], [place], [place] [address]      Labs: BNP (last 3 results) No results for  input(s): BNP in the last 8760 hours. Basic Metabolic Panel: Recent Labs  Lab 07/26/19 0136 07/27/19 0216 07/27/19 1220 07/28/19 0348 07/29/19 0322  NA 138 133* 136 138 138  K 5.2* 6.7* 4.7 4.8 4.6  CL 105 102 102 102 105  CO2 24 23 23 23 24   GLUCOSE 190* 324* 257* 198* 171*  BUN 28* 36* 37* 42* 36*  CREATININE 2.29* 2.34* 2.67* 3.45* 2.26*  CALCIUM 9.1 8.4* 9.1 8.8* 8.5*   Liver Function Tests: Recent Labs  Lab 07/26/19 0136  AST 17  ALT 14  ALKPHOS 112  BILITOT 0.4  PROT 7.0  ALBUMIN 3.5   CBC: Recent Labs  Lab 07/26/19 0136 07/27/19 0216 07/28/19 0348 07/29/19 0322  WBC 5.0 4.7 6.9 6.2  NEUTROABS 3.4  --   --   --   HGB 11.6* 9.3* 9.3* 8.5*  HCT 36.5 29.9* 29.9* 27.0*  MCV 86.7 87.9 89.0 87.7  PLT 220 174 184 162   Cardiac  Enzymes: No results for input(s): CKTOTAL, CKMB, CKMBINDEX, TROPONINI in the last 168 hours. BNP: Invalid input(s): POCBNP CBG: Recent Labs  Lab 07/28/19 1242 07/28/19 1737 07/28/19 2050 07/29/19 0557 07/29/19 0740  GLUCAP 262* 481* 209* 231* 260*   Urinalysis    Component Value Date/Time   COLORURINE YELLOW 03/10/2019 0745   APPEARANCEUR CLEAR 03/10/2019 0745   LABSPEC 1.010 03/10/2019 0745   PHURINE 5.0 03/10/2019 0745   GLUCOSEU >=500 (A) 03/10/2019 0745   HGBUR NEGATIVE 03/10/2019 0745   BILIRUBINUR NEGATIVE 03/10/2019 0745   KETONESUR NEGATIVE 03/10/2019 0745   PROTEINUR NEGATIVE 03/10/2019 0745   NITRITE NEGATIVE 03/10/2019 0745   LEUKOCYTESUR TRACE (A) 03/10/2019 0745   Sepsis Labs Invalid input(s): PROCALCITONIN,  WBC,  LACTICIDVEN Microbiology Recent Results (from the past 240 hour(s))  SARS CORONAVIRUS 2 (TAT 6-24 HRS) Nasopharyngeal Nasopharyngeal Swab     Status: None   Collection Time: 07/26/19 12:17 AM   Specimen: Nasopharyngeal Swab  Result Value Ref Range Status   SARS Coronavirus 2 NEGATIVE NEGATIVE Final    Comment: (NOTE) SARS-CoV-2 target nucleic acids are NOT DETECTED. The SARS-CoV-2 RNA is generally detectable in upper and lower respiratory specimens during the acute phase of infection. Negative results do not preclude SARS-CoV-2 infection, do not rule out co-infections with other pathogens, and should not be used as the sole basis for treatment or other patient management decisions. Negative results must be combined with clinical observations, patient history, and epidemiological information. The expected result is Negative. Fact Sheet for Patients: SugarRoll.be Fact Sheet for Healthcare Providers: https://www.woods-mathews.com/ This test is not yet approved or cleared by the [place] FDA and  has been authorized for detection and/or diagnosis of SARS-CoV-2 by FDA under an Emergency Use  Authorization (EUA). This EUA will remain  in effect (meaning this test can be used) for the duration of the COVID-19 declaration under Section 56 4(b)(1) of the Act, 21 U.S.C. section 360bbb-3(b)(1), unless the authorization is terminated or revoked sooner. Performed at [facility], [place] [address], [place], [place] [address]   Surgical pcr screen     Status: None   Collection Time: 07/26/19 12:42 AM   Specimen: Nasal Mucosa; Nasal Swab  Result Value Ref Range Status   MRSA, PCR NEGATIVE NEGATIVE Final   Staphylococcus aureus NEGATIVE NEGATIVE Final    Comment: (NOTE) The Xpert SA Assay (FDA approved for NASAL specimens in patients 29 years of age and older), is one component of a comprehensive surveillance program. It is  not intended to diagnose infection nor to guide or monitor treatment. Performed at [facility], [place] [address], [place], [place] [address]   SARS CORONAVIRUS 2 (TAT 6-24 HRS) Nasopharyngeal Nasopharyngeal Swab     Status: None   Collection Time: 07/28/19  4:47 PM   Specimen: Nasopharyngeal Swab  Result Value Ref Range Status   SARS Coronavirus 2 NEGATIVE NEGATIVE Final    Comment: (NOTE) SARS-CoV-2 target nucleic acids are NOT DETECTED. The SARS-CoV-2 RNA is generally detectable in upper and lower respiratory specimens during the acute phase of infection. Negative results do not preclude SARS-CoV-2 infection, do not rule out co-infections with other pathogens, and should not be used as the sole basis for treatment or other patient management decisions. Negative results must be combined with clinical observations, patient history, and epidemiological information. The expected result is Negative. Fact Sheet for Patients: SugarRoll.be Fact Sheet for Healthcare Providers: https://www.woods-mathews.com/ This test is not yet approved or cleared by the [place] FDA and  has been authorized for detection  and/or diagnosis of SARS-CoV-2 by FDA under an Emergency Use Authorization (EUA). This EUA will remain  in effect (meaning this test can be used) for the duration of the COVID-19 declaration under Section 56 4(b)(1) of the Act, 21 U.S.C. section 360bbb-3(b)(1), unless the authorization is terminated or revoked sooner. Performed at [facility], [place] [address] [place], [place] [address]     Time coordinating discharge: Over 30 minutes  SIGNED:   Little Ishikawa, DO [facility] 07/29/2019, 7:58 AM Pager   If 7PM-7AM, please contact night-coverage [email or web address] Password [email or web address]

## 2019-07-29 NOTE — Progress Notes (Addendum)
Patient ID: Argentina D Hiltunen, female   DOB: Jun 08, 1971, 48 y.o.   MRN: [id number]     Subjective:  Patient reports pain as mild to moderate.  Patient in bed and not in boot.  Somewhat confused.    Objective:   VITALS:   Vitals:   07/28/19 0502 07/28/19 1245 07/28/19 2053 07/29/19 0528  BP: (!) 153/57 (!) 144/56 (!) 128/57 (!) 132/56  Pulse: 84 90 81 88  Resp: 16 20 18 18   Temp: 98.5 F (36.9 C) 98.4 F (36.9 C) 98.9 F (37.2 C) 99.4 F (37.4 C)  TempSrc: Oral Oral Oral Oral  SpO2: 98% 96% 93% 96%  Weight:      Height:        ABD soft Sensation intact distally Dorsiflexion/Plantar flexion intact Incision: dressing C/D/I and no drainage   Lab Results  Component Value Date   WBC 6.2 07/29/2019   HGB 8.5 (L) 07/29/2019   HCT 27.0 (L) 07/29/2019   MCV 87.7 07/29/2019   PLT 162 07/29/2019   BMET    Component Value Date/Time   NA 138 07/29/2019 0322   K 4.6 07/29/2019 0322   CL 105 07/29/2019 0322   CO2 24 07/29/2019 0322   GLUCOSE 171 (H) 07/29/2019 0322   BUN 36 (H) 07/29/2019 0322   CREATININE 2.26 (H) 07/29/2019 0322   CALCIUM 8.5 (L) 07/29/2019 0322   GFRNONAA 25 (L) 07/29/2019 0322   GFRAA 29 (L) 07/29/2019 0322     Assessment/Plan: 3 Days Post-Op   Principal Problem:   Closed right pilon fracture, initial encounter Active Problems:   Diabetes mellitus type 2 in obese ([place])   Hypertension   Advance diet Up with therapy Boot at all times Patient seems have confusion defer to medicine Continue plan per medicine Patient should be in the boot at all times  Lunette Stands 07/29/2019, 12:15 PM  Discussed and agree with above.   Marchia Bond, MD Cell [phone]

## 2019-07-29 NOTE — Plan of Care (Signed)

## 2019-07-29 NOTE — TOC Transition Note (Signed)
Transition of Care [facility]) - CM/SW Discharge Note   Patient Details  Name: Deanna Landry MRN: [id number] Date of Birth: [birth year]  Transition of Care [facility] CM/SW Contact:  Gabrielle Dare Phone Number: 07/29/2019, 11:32 AM   Clinical Narrative:    Patient will Discharge To: [place] Anticipated DC Date:07/29/2019 Family Notified:left voice message for mother to return phone call, Hannah Beat, [phone] Transport [id number]   Per MD patient ready for DC to [facility] . RN, patient, patient's family, and facility notified of DC. Assessment, Fl2/Pasrr, and Discharge Summary sent to facility. RN given number for report [phone], Room # 119 A). DC packet on chart. Ambulance transport requested for patient.   CSW signing off.  Reed Breech LCSWA [phone]     Final next level of care: Skilled Nursing Facility Barriers to Discharge: No Barriers Identified   Patient Goals and CMS Choice Patient states their goals for this hospitalization and ongoing recovery are:: to have some assistance at discharge with therapies CMS Medicare.gov Compare Post Acute Care list provided to:: (n/a at this time) Choice offered to / list presented to : Patient  Discharge Placement              Patient [facility] bed at: [facility] Patient to be transferred to facility by: [place] Name of family member notified: Hannah Beat Patient and family notified of of transfer: 07/29/19  Discharge Plan and Services In-house Referral: Clinical Social Work Discharge Planning Services: CM Consult Post Acute Care Choice: NA(unsure at this time)                               Social Determinants of Health (SDOH) Interventions     Readmission Risk Interventions No flowsheet data found.

## 2019-08-01 DIAGNOSIS — S82891D Other fracture of right lower leg, subsequent encounter for closed fracture with routine healing: Secondary | ICD-10-CM | POA: Diagnosis not present

## 2019-08-01 DIAGNOSIS — N183 Chronic kidney disease, stage 3 (moderate): Secondary | ICD-10-CM | POA: Diagnosis not present

## 2019-08-01 DIAGNOSIS — I10 Essential (primary) hypertension: Secondary | ICD-10-CM | POA: Diagnosis not present

## 2019-08-01 DIAGNOSIS — E119 Type 2 diabetes mellitus without complications: Secondary | ICD-10-CM | POA: Diagnosis not present

## 2019-08-08 DIAGNOSIS — M25571 Pain in right ankle and joints of right foot: Secondary | ICD-10-CM | POA: Diagnosis not present

## 2019-08-08 DIAGNOSIS — R5381 Other malaise: Secondary | ICD-10-CM | POA: Diagnosis not present

## 2019-08-08 DIAGNOSIS — M6281 Muscle weakness (generalized): Secondary | ICD-10-CM | POA: Diagnosis not present

## 2019-08-08 DIAGNOSIS — R262 Difficulty in walking, not elsewhere classified: Secondary | ICD-10-CM | POA: Diagnosis not present

## 2019-08-10 DIAGNOSIS — M25571 Pain in right ankle and joints of right foot: Secondary | ICD-10-CM | POA: Diagnosis not present

## 2019-08-10 DIAGNOSIS — R5381 Other malaise: Secondary | ICD-10-CM | POA: Diagnosis not present

## 2019-08-10 DIAGNOSIS — M6281 Muscle weakness (generalized): Secondary | ICD-10-CM | POA: Diagnosis not present

## 2019-08-10 DIAGNOSIS — R262 Difficulty in walking, not elsewhere classified: Secondary | ICD-10-CM | POA: Diagnosis not present

## 2019-08-10 DIAGNOSIS — L97319 Non-pressure chronic ulcer of right ankle with unspecified severity: Secondary | ICD-10-CM | POA: Diagnosis not present

## 2019-08-10 DIAGNOSIS — S82851D Displaced trimalleolar fracture of right lower leg, subsequent encounter for closed fracture with routine healing: Secondary | ICD-10-CM | POA: Diagnosis not present

## 2019-08-10 DIAGNOSIS — E1165 Type 2 diabetes mellitus with hyperglycemia: Secondary | ICD-10-CM | POA: Diagnosis not present

## 2019-08-14 DIAGNOSIS — M25571 Pain in right ankle and joints of right foot: Secondary | ICD-10-CM | POA: Diagnosis not present

## 2019-08-14 DIAGNOSIS — F419 Anxiety disorder, unspecified: Secondary | ICD-10-CM | POA: Diagnosis not present

## 2019-08-14 DIAGNOSIS — E1165 Type 2 diabetes mellitus with hyperglycemia: Secondary | ICD-10-CM | POA: Diagnosis not present

## 2019-08-14 DIAGNOSIS — S82851D Displaced trimalleolar fracture of right lower leg, subsequent encounter for closed fracture with routine healing: Secondary | ICD-10-CM | POA: Diagnosis not present

## 2019-08-15 DIAGNOSIS — R262 Difficulty in walking, not elsewhere classified: Secondary | ICD-10-CM | POA: Diagnosis not present

## 2019-08-15 DIAGNOSIS — M6281 Muscle weakness (generalized): Secondary | ICD-10-CM | POA: Diagnosis not present

## 2019-08-15 DIAGNOSIS — M25571 Pain in right ankle and joints of right foot: Secondary | ICD-10-CM | POA: Diagnosis not present

## 2019-08-15 DIAGNOSIS — R5381 Other malaise: Secondary | ICD-10-CM | POA: Diagnosis not present

## 2019-08-16 DIAGNOSIS — I5042 Chronic combined systolic (congestive) and diastolic (congestive) heart failure: Secondary | ICD-10-CM | POA: Diagnosis not present

## 2019-08-17 DIAGNOSIS — L97319 Non-pressure chronic ulcer of right ankle with unspecified severity: Secondary | ICD-10-CM | POA: Diagnosis not present

## 2019-08-22 DIAGNOSIS — R262 Difficulty in walking, not elsewhere classified: Secondary | ICD-10-CM | POA: Diagnosis not present

## 2019-08-22 DIAGNOSIS — R5381 Other malaise: Secondary | ICD-10-CM | POA: Diagnosis not present

## 2019-08-22 DIAGNOSIS — M6281 Muscle weakness (generalized): Secondary | ICD-10-CM | POA: Diagnosis not present

## 2019-08-22 DIAGNOSIS — S82871D Displaced pilon fracture of right tibia, subsequent encounter for closed fracture with routine healing: Secondary | ICD-10-CM | POA: Diagnosis not present

## 2019-08-22 DIAGNOSIS — M25571 Pain in right ankle and joints of right foot: Secondary | ICD-10-CM | POA: Diagnosis not present

## 2019-08-24 DIAGNOSIS — L97319 Non-pressure chronic ulcer of right ankle with unspecified severity: Secondary | ICD-10-CM | POA: Diagnosis not present

## 2019-08-25 DIAGNOSIS — H548 Legal blindness, as defined in USA: Secondary | ICD-10-CM | POA: Diagnosis not present

## 2019-08-25 DIAGNOSIS — F3289 Other specified depressive episodes: Secondary | ICD-10-CM | POA: Diagnosis not present

## 2019-08-25 DIAGNOSIS — M25571 Pain in right ankle and joints of right foot: Secondary | ICD-10-CM | POA: Diagnosis not present

## 2019-08-25 DIAGNOSIS — E1165 Type 2 diabetes mellitus with hyperglycemia: Secondary | ICD-10-CM | POA: Diagnosis not present

## 2019-08-25 DIAGNOSIS — M6281 Muscle weakness (generalized): Secondary | ICD-10-CM | POA: Diagnosis not present

## 2019-08-25 DIAGNOSIS — S82851D Displaced trimalleolar fracture of right lower leg, subsequent encounter for closed fracture with routine healing: Secondary | ICD-10-CM | POA: Diagnosis not present

## 2019-08-25 DIAGNOSIS — E039 Hypothyroidism, unspecified: Secondary | ICD-10-CM | POA: Diagnosis not present

## 2019-08-25 DIAGNOSIS — N183 Chronic kidney disease, stage 3 unspecified: Secondary | ICD-10-CM | POA: Diagnosis not present

## 2019-08-25 DIAGNOSIS — I10 Essential (primary) hypertension: Secondary | ICD-10-CM | POA: Diagnosis not present

## 2019-08-26 DIAGNOSIS — S82871D Displaced pilon fracture of right tibia, subsequent encounter for closed fracture with routine healing: Secondary | ICD-10-CM | POA: Diagnosis not present

## 2019-08-29 ENCOUNTER — Other Ambulatory Visit: Payer: Self-pay

## 2019-08-29 NOTE — Patient Outreach (Signed)
[place] [facility]) Care Management  08/29/2019  Deanna Landry 19-Apr-1971 [id number]     Transition of Care Referral  Referral Date: 08/29/2019 Referral Source: [employer] Discharge Report Date of Admission: 07/29/2019 Diagnosis: right ankle fracture s/p fall Date of Discharge: 08/26/2019 Facility: [place] [facility]/ [facility]    Outreach attempt # 1 to patient. No answer. RN CM left HIPAA compliant voicemail message along with contact info.   Plan: RN CM will make outreach attempt to patient within 3-4 business days. RN CM will send unsuccessful outreach letter to patient.   Enzo Montgomery, RN,BSN,CCM [place] Management Telephonic Care Management Coordinator Direct Phone: [phone] Toll Free: [phone] Fax: [phone]

## 2019-08-30 DIAGNOSIS — Z23 Encounter for immunization: Secondary | ICD-10-CM | POA: Diagnosis not present

## 2019-08-30 DIAGNOSIS — I509 Heart failure, unspecified: Secondary | ICD-10-CM | POA: Diagnosis not present

## 2019-08-30 DIAGNOSIS — I679 Cerebrovascular disease, unspecified: Secondary | ICD-10-CM | POA: Diagnosis not present

## 2019-08-30 DIAGNOSIS — N183 Chronic kidney disease, stage 3 unspecified: Secondary | ICD-10-CM | POA: Diagnosis not present

## 2019-08-30 DIAGNOSIS — G5603 Carpal tunnel syndrome, bilateral upper limbs: Secondary | ICD-10-CM | POA: Diagnosis not present

## 2019-08-30 DIAGNOSIS — R300 Dysuria: Secondary | ICD-10-CM | POA: Diagnosis not present

## 2019-08-30 DIAGNOSIS — J449 Chronic obstructive pulmonary disease, unspecified: Secondary | ICD-10-CM | POA: Diagnosis not present

## 2019-08-30 DIAGNOSIS — M159 Polyosteoarthritis, unspecified: Secondary | ICD-10-CM | POA: Diagnosis not present

## 2019-08-30 DIAGNOSIS — D649 Anemia, unspecified: Secondary | ICD-10-CM | POA: Diagnosis not present

## 2019-08-31 ENCOUNTER — Other Ambulatory Visit: Payer: Self-pay

## 2019-08-31 DIAGNOSIS — M6281 Muscle weakness (generalized): Secondary | ICD-10-CM | POA: Diagnosis not present

## 2019-08-31 DIAGNOSIS — S82851D Displaced trimalleolar fracture of right lower leg, subsequent encounter for closed fracture with routine healing: Secondary | ICD-10-CM | POA: Diagnosis not present

## 2019-08-31 DIAGNOSIS — S82871D Displaced pilon fracture of right tibia, subsequent encounter for closed fracture with routine healing: Secondary | ICD-10-CM | POA: Diagnosis not present

## 2019-08-31 NOTE — Patient Outreach (Signed)
[facility] [facility]) Care Management  08/31/2019   Deanna Landry Nov 11, 1971 [id number]  Initial Assessment  Transition of Care Referral  Referral Date:08/29/2019 Referral Source:[employer] Discharge Report Date of Admission:07/29/2019 Diagnosis:right ankle fracture s/p fall Date of Discharge:08/26/2019 [place] Medicare/ Medicaid   Voicemail message received from patient. Return call placed to patient. Spoke with pt. who voices she is doing fairly well since discharge home from rehab. She has very supportive mother in the home who is assisting her as needed. Patient completed PCP appt on yesterday. She got flu shot while at appt. She voices that her "urine was cloudy" and tested while at appt. She has been placed on antibiotic to take for seven days. Appetite remains fair but slightly decreased. Patient states she likes to drink Glucerna supplements but unable to afford them all the time. She is agreeable to RN CM mailing out coupons to aide in purchasing them. She is monitoring cbgs 4x/day in the home. Blood sugar today was 198. Patient reports no hypoglycemic events since returning home. She is aware of s/s of hypoglycemia and voices that her mother is also a diabetic and can tell when her blood sugar is dropping. She reports that she has glucose gel in the home to take as needed. She reports not eating bedtime snack with evening insulin. RN CM discussed with pt. The dangers of this and encouraged her to eat a bedtime snack to prevent hypoglycemic events. Patient states that she fell earlier this week attempting to get out of car while at [place]. She reports some bruising and soreness. Her right foot remains in ankle boot. She states that [facility] services have contacted her and someone is supposed to visit her later today.  Conditions: Per chart review, patient has PMH that includes but is not limited to DM, HTN, obesity,  hypothyroidism, CKD stage 3, stroke, depression,  right eye blindess and impaired left eye vision.   Medications: Med review completed with pt. She denies any issus affording her meds. Sh reports that her mother assists her with managing her meds.   Current Medications:  Current Outpatient Medications  Medication Sig Dispense Refill  . ACCU-CHEK AVIVA PLUS test strip USE TO CHECK BLOOD SUGAR 4 TIMES A DAY    . amLODipine (NORVASC) 10 MG tablet Take 10 mg by mouth daily.    Marland Kitchen aspirin EC 81 MG tablet Take 81 mg by mouth daily.    Marland Kitchen atorvastatin (LIPITOR) 80 MG tablet Take 80 mg by mouth daily.    Marland Kitchen atropine 1 % ophthalmic solution Place 1 drop into the right eye 2 (two) times daily.    Marland Kitchen buPROPion (WELLBUTRIN XL) 150 MG 24 hr tablet Take 150 mg by mouth daily. Pt  States takes 300mg     . ciprofloxacin (CIPRO) 250 MG tablet Take 250 mg by mouth 2 (two) times daily.    . clopidogrel (PLAVIX) 75 MG tablet Take 75 mg by mouth daily.    . dorzolamide-timolol (COSOPT) 22.3-6.8 MG/ML ophthalmic solution Place 1 drop into both eyes 2 (two) times daily.     . isosorbide dinitrate (ISORDIL) 20 MG tablet Take 20 mg by mouth 2 (two) times daily.    Marland Kitchen latanoprost (XALATAN) 0.005 % ophthalmic solution Place 1 drop into the left eye every evening.     Marland Kitchen LEVEMIR 100 UNIT/ML injection Inject 30 Units into the skin 2 (two) times daily.     Marland Kitchen levothyroxine (SYNTHROID) 50 MCG tablet Take 50  mcg by mouth daily.    . metoCLOPramide (REGLAN) 5 MG tablet Take 1 tablet (5 mg total) by mouth 3 (three) times daily before meals. 90 tablet 0  . NOVOLOG FLEXPEN 100 UNIT/ML FlexPen Inject 10 Units into the skin 3 (three) times daily.    . pantoprazole (PROTONIX) 40 MG tablet Take 40 mg by mouth daily.    Vladimir Faster Glycol-Propyl Glycol (SYSTANE) 0.4-0.3 % GEL ophthalmic gel Place 1 application into both eyes 3 (three) times daily as needed (dry eye).    . prednisoLONE acetate (PRED FORTE) 1 % ophthalmic suspension Place 1  drop into the right eye 2 (two) times daily.     . pregabalin (LYRICA) 75 MG capsule Take 75 mg by mouth 2 (two) times daily.    . sertraline (ZOLOFT) 100 MG tablet Take 100 mg by mouth daily.    . TRULICITY 1.5 0000000 SOPN Inject 1.5 mg into the skin every Monday.     . CVS ZINC OXIDE 20 % ointment Apply 1 application topically as needed for irritation.     Marland Kitchen liver oil-zinc oxide (DESITIN) 40 % ointment Apply topically as needed for irritation. (Patient not taking: Reported on 08/31/2019) 56.7 g 0  . ondansetron (ZOFRAN) 4 MG tablet Take 1 tablet (4 mg total) by mouth every 6 (six) hours as needed for nausea. (Patient not taking: Reported on 08/31/2019) 20 tablet 0  . Vitamin D, Ergocalciferol, (DRISDOL) 1.25 MG (50000 UT) CAPS capsule Take 1 capsule (50,000 Units total) by mouth every Thursday for 5 doses. (Patient not taking: Reported on 08/31/2019) 5 capsule 0   No current facility-administered medications for this visit.     Functional Status:  In your present state of health, do you have any difficulty performing the following activities: 08/31/2019 07/28/2019  Hearing? N -  Vision? Y -  Comment pt. completely blind in right eye, limited vision in left eye -  Difficulty concentrating or making decisions? N -  Walking or climbing stairs? Y -  Comment recent ankle fx and surgery -  Dressing or bathing? N -  Doing errands, shopping? Y Y  Comment recent ankle fx and surgery-mother assists -  Preparing Food and eating ? Y -  Comment blindness and limited mobility due to surgery -  Using the Toilet? N -  In the past six months, have you accidently leaked urine? N -  Do you have problems with loss of bowel control? N -  Managing your Medications? Y -  Comment vision deficit-mother assists -  Managing your Finances? Y -  Comment vision deficit-mother assists -  Housekeeping or managing your Housekeeping? Y -  Comment vision deficit and recent ankle surgery -  Some encounter  information is confidential and restricted. Go to Review Flowsheets activity to see all data.  Some recent data might be hidden    Fall/Depression Screening: Fall Risk  08/31/2019  Falls in the past year? 1  Number falls in past yr: 1  Injury with Fall? 1  Risk for fall due to : History of fall(s);Impaired vision;Impaired balance/gait;Medication side effect;Impaired mobility;Mental status change  Follow up Falls evaluation completed;Education provided   PHQ 2/9 Scores 08/31/2019  PHQ - 2 Score 0    Assessment:  THN CM Care Plan Problem One     Most Recent Value  Care Plan Problem One  Patient at risk for hospital readmission due to co-morbidities.  Role Documenting the Problem One  Care Management Telephonic Coordinator  Care Plan  for Problem One  Active  THN Long Term Goal   Patient will have no hospital readmissions within the next 31 days.  THN Long Term Goal Start Date  08/31/19  Interventions for Problem One Long Term Goal  RN CM assessed pt. for any acute issues. RN CM reviewded with pt s/s of worsening condition and when to seek medical attention.  THN CM Short Term Goal #1   Patient will complete all post discharge follow up appts over the next 30days.  THN CM Short Term Goal #1 Start Date  08/31/19  Interventions for Short Term Goal #1  RNCM assessed for appt schedule. RN Cm confirmed that pt. has reliable transportation to get to appts    [facility] CM Care Plan Problem Two     Most Recent Value  Care Plan Problem Two  Patietn high risk for falls due to impaired mobility, vision and recent surgery.  Role Documenting the Problem Two  Care Management Telephonic [place] for Problem Two  Active  Interventions for Problem Two Long Term Goal   RNCM confirmed that [facility] services in place and has contacted pt. RN Cm reviewed wiht pt. fall and safety measures.  THN Long Term Goal  Patient will report actively working with and participating in [place] sessions over the next 31  days.  THN Long Term Goal Start Date  08/31/19  [facility] CM Short Term Goal #1   Patient will sustain no falls over the next 30 days.  THN CM Short Term Goal #1 Start Date  08/31/19  Interventions for Short Term Goal #2   RN CM completed falls eval. RN CM instructed pt. on fall/safety precaution measures.    [facility] CM Care Plan Problem Three     Most Recent Value  Care Plan Problem Three  Patient with UTI  Role Documenting the Problem Three  Care Management Telephonic Coordinator  Care Plan for Problem Three  Active  THN CM Short Term Goal #1   Patient will complete antibiotic therapy and report resolution of UTI over the next 30 days.  THN CM Short Term Goal #1 Start Date  08/31/19  Interventions for Short Term Goal #1  RN CM educated pt. on antibiotic usage and importance of completing medication. RN CM reviewed with pt. s/s of infection and ways to treat UTI non-pharmacologically.     Consent: [facility] services reviewed and dicussed with pt. Verbal consent for services given.  Plan:  RN CM will make outreach follow up call to patient within a week. RN CM wills end out welcome letter along with Glucerna coupons to patient via mail. RN CM will send barriers letter and route encounter to PCP.   Enzo Montgomery, RN,BSN,CCM [place] Management Telephonic Care Management Coordinator Direct Phone: [phone] Toll Free: [phone] Fax: [phone]

## 2019-08-31 NOTE — Patient Outreach (Signed)
[name]) Care Management  08/31/2019  Deanna Landry 1971/03/06 [id number]   Transition of Care Referral  Referral Date: 08/29/2019 Referral Source: [employer] Discharge Report Date of Admission: 07/29/2019 Diagnosis: right ankle fracture s/p fall Date of Discharge: 08/26/2019 Facility: [place] Medicare/ Medicaid   Outreach attempt #2 to patient. No answer at present.      Plan: RN CM will make outreach attempt to patient within 3-4 business days.   Enzo Montgomery, RN,BSN,CCM [place] Management Telephonic Care Management Coordinator Direct Phone: [phone] Toll Free: [phone] Fax: [phone]

## 2019-09-04 DIAGNOSIS — I509 Heart failure, unspecified: Secondary | ICD-10-CM | POA: Diagnosis not present

## 2019-09-04 DIAGNOSIS — H548 Legal blindness, as defined in USA: Secondary | ICD-10-CM | POA: Diagnosis not present

## 2019-09-04 DIAGNOSIS — N183 Chronic kidney disease, stage 3 unspecified: Secondary | ICD-10-CM | POA: Diagnosis not present

## 2019-09-04 DIAGNOSIS — E1122 Type 2 diabetes mellitus with diabetic chronic kidney disease: Secondary | ICD-10-CM | POA: Diagnosis not present

## 2019-09-04 DIAGNOSIS — S82871D Displaced pilon fracture of right tibia, subsequent encounter for closed fracture with routine healing: Secondary | ICD-10-CM | POA: Diagnosis not present

## 2019-09-04 DIAGNOSIS — I130 Hypertensive heart and chronic kidney disease with heart failure and stage 1 through stage 4 chronic kidney disease, or unspecified chronic kidney disease: Secondary | ICD-10-CM | POA: Diagnosis not present

## 2019-09-04 DIAGNOSIS — H04123 Dry eye syndrome of bilateral lacrimal glands: Secondary | ICD-10-CM | POA: Diagnosis not present

## 2019-09-04 DIAGNOSIS — E039 Hypothyroidism, unspecified: Secondary | ICD-10-CM | POA: Diagnosis not present

## 2019-09-04 DIAGNOSIS — F3289 Other specified depressive episodes: Secondary | ICD-10-CM | POA: Diagnosis not present

## 2019-09-07 ENCOUNTER — Other Ambulatory Visit: Payer: Self-pay

## 2019-09-07 DIAGNOSIS — I130 Hypertensive heart and chronic kidney disease with heart failure and stage 1 through stage 4 chronic kidney disease, or unspecified chronic kidney disease: Secondary | ICD-10-CM | POA: Diagnosis not present

## 2019-09-07 DIAGNOSIS — H04123 Dry eye syndrome of bilateral lacrimal glands: Secondary | ICD-10-CM | POA: Diagnosis not present

## 2019-09-07 DIAGNOSIS — E1122 Type 2 diabetes mellitus with diabetic chronic kidney disease: Secondary | ICD-10-CM | POA: Diagnosis not present

## 2019-09-07 DIAGNOSIS — E039 Hypothyroidism, unspecified: Secondary | ICD-10-CM | POA: Diagnosis not present

## 2019-09-07 DIAGNOSIS — S82871D Displaced pilon fracture of right tibia, subsequent encounter for closed fracture with routine healing: Secondary | ICD-10-CM | POA: Diagnosis not present

## 2019-09-07 DIAGNOSIS — I509 Heart failure, unspecified: Secondary | ICD-10-CM | POA: Diagnosis not present

## 2019-09-07 DIAGNOSIS — N183 Chronic kidney disease, stage 3 unspecified: Secondary | ICD-10-CM | POA: Diagnosis not present

## 2019-09-07 DIAGNOSIS — F3289 Other specified depressive episodes: Secondary | ICD-10-CM | POA: Diagnosis not present

## 2019-09-07 DIAGNOSIS — H548 Legal blindness, as defined in USA: Secondary | ICD-10-CM | POA: Diagnosis not present

## 2019-09-07 NOTE — Patient Outreach (Signed)
[place] [facility]) Care Management  09/07/2019  Lorry D Avallone 1971/08/03 [id number]   Transition of Care Week #2    Outreach attempt #1 to patient.No answer. RN CM left HIPAA compliant voicemail message along with contact info.     Plan: RN CM will RN CM will make outreach attempt to patient within 3-4 business days.    Enzo Montgomery, RN,BSN,CCM [place] Management Telephonic Care Management Coordinator Direct Phone: [phone] Toll Free: [phone] Fax: [phone]

## 2019-09-11 ENCOUNTER — Other Ambulatory Visit: Payer: Self-pay

## 2019-09-11 NOTE — Patient Outreach (Signed)
[place] [facility]) Care Management  09/11/2019  Shailee D Rabold [birth year] [id number]   Transition of Care Week #3    Outreach attempt #2 to patient. No answer. RN CM left HIPAA compliant voicemail message along with contact info.      Plan: RN CM will make outreach attempt to patient within 3-4 business days. RN CM will send unsuccessful outreach letter to patient.   Enzo Montgomery, RN,BSN,CCM [place] Management Telephonic Care Management Coordinator Direct Phone: [phone] Toll Free: [phone] Fax: [phone]

## 2019-09-12 DIAGNOSIS — F419 Anxiety disorder, unspecified: Secondary | ICD-10-CM | POA: Diagnosis not present

## 2019-09-12 DIAGNOSIS — I679 Cerebrovascular disease, unspecified: Secondary | ICD-10-CM | POA: Diagnosis not present

## 2019-09-12 DIAGNOSIS — M159 Polyosteoarthritis, unspecified: Secondary | ICD-10-CM | POA: Diagnosis not present

## 2019-09-12 DIAGNOSIS — J449 Chronic obstructive pulmonary disease, unspecified: Secondary | ICD-10-CM | POA: Diagnosis not present

## 2019-09-12 DIAGNOSIS — I509 Heart failure, unspecified: Secondary | ICD-10-CM | POA: Diagnosis not present

## 2019-09-12 DIAGNOSIS — D649 Anemia, unspecified: Secondary | ICD-10-CM | POA: Diagnosis not present

## 2019-09-12 DIAGNOSIS — N183 Chronic kidney disease, stage 3 unspecified: Secondary | ICD-10-CM | POA: Diagnosis not present

## 2019-09-12 DIAGNOSIS — G5603 Carpal tunnel syndrome, bilateral upper limbs: Secondary | ICD-10-CM | POA: Diagnosis not present

## 2019-09-12 DIAGNOSIS — I2510 Atherosclerotic heart disease of native coronary artery without angina pectoris: Secondary | ICD-10-CM | POA: Diagnosis not present

## 2019-09-15 ENCOUNTER — Other Ambulatory Visit: Payer: Self-pay

## 2019-09-15 DIAGNOSIS — I5042 Chronic combined systolic (congestive) and diastolic (congestive) heart failure: Secondary | ICD-10-CM | POA: Diagnosis not present

## 2019-09-15 DIAGNOSIS — S82871D Displaced pilon fracture of right tibia, subsequent encounter for closed fracture with routine healing: Secondary | ICD-10-CM | POA: Diagnosis not present

## 2019-09-15 DIAGNOSIS — I509 Heart failure, unspecified: Secondary | ICD-10-CM | POA: Diagnosis not present

## 2019-09-15 DIAGNOSIS — H548 Legal blindness, as defined in USA: Secondary | ICD-10-CM | POA: Diagnosis not present

## 2019-09-15 DIAGNOSIS — F3289 Other specified depressive episodes: Secondary | ICD-10-CM | POA: Diagnosis not present

## 2019-09-15 DIAGNOSIS — H04123 Dry eye syndrome of bilateral lacrimal glands: Secondary | ICD-10-CM | POA: Diagnosis not present

## 2019-09-15 DIAGNOSIS — N183 Chronic kidney disease, stage 3 unspecified: Secondary | ICD-10-CM | POA: Diagnosis not present

## 2019-09-15 DIAGNOSIS — I130 Hypertensive heart and chronic kidney disease with heart failure and stage 1 through stage 4 chronic kidney disease, or unspecified chronic kidney disease: Secondary | ICD-10-CM | POA: Diagnosis not present

## 2019-09-15 DIAGNOSIS — E1122 Type 2 diabetes mellitus with diabetic chronic kidney disease: Secondary | ICD-10-CM | POA: Diagnosis not present

## 2019-09-15 DIAGNOSIS — E039 Hypothyroidism, unspecified: Secondary | ICD-10-CM | POA: Diagnosis not present

## 2019-09-15 NOTE — Patient Outreach (Signed)
[place] [facility]) Care Management  09/15/2019  Connie D Melder February 14, 1971 [id number]   Transition of Care Week # 3   Outreach attempt #2 to patient. No answer at present. RN CM left HIPAA compliant voicemail message along with contact info.     Plan: RN CM will make outreach attempt to patient within 3-4 business days.   Enzo Montgomery, RN,BSN,CCM [place] Management Telephonic Care Management Coordinator Direct Phone: [phone] Toll Free: [phone] Fax: [phone]

## 2019-09-19 ENCOUNTER — Other Ambulatory Visit: Payer: Self-pay

## 2019-09-19 NOTE — Patient Outreach (Signed)
[place] [facility]) Care Management  09/19/2019  Ellyanna D Twombly Jul 30, 1971 [id number]   Transition of Care Week #4    Outreach attempt #3 to patient. No answer at present.       Plan: RN CM will make outreach make outreach attempt to patient within 3-4 business days.   Enzo Montgomery, RN,BSN,CCM [place] Management Telephonic Care Management Coordinator Direct Phone: [phone] Toll Free: [phone] Fax: [phone]

## 2019-09-20 ENCOUNTER — Ambulatory Visit: Payer: Self-pay

## 2019-09-20 DIAGNOSIS — I130 Hypertensive heart and chronic kidney disease with heart failure and stage 1 through stage 4 chronic kidney disease, or unspecified chronic kidney disease: Secondary | ICD-10-CM | POA: Diagnosis not present

## 2019-09-20 DIAGNOSIS — I509 Heart failure, unspecified: Secondary | ICD-10-CM | POA: Diagnosis not present

## 2019-09-20 DIAGNOSIS — E039 Hypothyroidism, unspecified: Secondary | ICD-10-CM | POA: Diagnosis not present

## 2019-09-20 DIAGNOSIS — F3289 Other specified depressive episodes: Secondary | ICD-10-CM | POA: Diagnosis not present

## 2019-09-20 DIAGNOSIS — N183 Chronic kidney disease, stage 3 unspecified: Secondary | ICD-10-CM | POA: Diagnosis not present

## 2019-09-20 DIAGNOSIS — S82871D Displaced pilon fracture of right tibia, subsequent encounter for closed fracture with routine healing: Secondary | ICD-10-CM | POA: Diagnosis not present

## 2019-09-20 DIAGNOSIS — H548 Legal blindness, as defined in USA: Secondary | ICD-10-CM | POA: Diagnosis not present

## 2019-09-20 DIAGNOSIS — H04123 Dry eye syndrome of bilateral lacrimal glands: Secondary | ICD-10-CM | POA: Diagnosis not present

## 2019-09-20 DIAGNOSIS — E1122 Type 2 diabetes mellitus with diabetic chronic kidney disease: Secondary | ICD-10-CM | POA: Diagnosis not present

## 2019-09-21 DIAGNOSIS — H25812 Combined forms of age-related cataract, left eye: Secondary | ICD-10-CM | POA: Diagnosis not present

## 2019-09-21 DIAGNOSIS — H401133 Primary open-angle glaucoma, bilateral, severe stage: Secondary | ICD-10-CM | POA: Diagnosis not present

## 2019-09-21 DIAGNOSIS — Z01818 Encounter for other preprocedural examination: Secondary | ICD-10-CM | POA: Diagnosis not present

## 2019-09-25 ENCOUNTER — Other Ambulatory Visit: Payer: Self-pay

## 2019-09-25 NOTE — Patient Outreach (Signed)
[place] [facility]) Care Management  09/25/2019  Deanna Landry 1971/06/03 [id number]   Transition of Care Week #4   Outreach attempt to patient. No answer at present.    Plan: RN CM will make outreach attempt to patient within one month. RN CM left HIPAA compliant voicemail message along with contact info.    Enzo Montgomery, RN,BSN,CCM [place] Management Telephonic Care Management Coordinator Direct Phone: [phone] Toll Free: [phone] Fax: [phone]

## 2019-09-26 DIAGNOSIS — S82851D Displaced trimalleolar fracture of right lower leg, subsequent encounter for closed fracture with routine healing: Secondary | ICD-10-CM | POA: Diagnosis not present

## 2019-09-26 DIAGNOSIS — S82871D Displaced pilon fracture of right tibia, subsequent encounter for closed fracture with routine healing: Secondary | ICD-10-CM | POA: Diagnosis not present

## 2019-09-26 DIAGNOSIS — M6281 Muscle weakness (generalized): Secondary | ICD-10-CM | POA: Diagnosis not present

## 2019-09-27 DIAGNOSIS — I509 Heart failure, unspecified: Secondary | ICD-10-CM | POA: Diagnosis not present

## 2019-09-27 DIAGNOSIS — I679 Cerebrovascular disease, unspecified: Secondary | ICD-10-CM | POA: Diagnosis not present

## 2019-09-27 DIAGNOSIS — Z01818 Encounter for other preprocedural examination: Secondary | ICD-10-CM | POA: Diagnosis not present

## 2019-09-27 DIAGNOSIS — D649 Anemia, unspecified: Secondary | ICD-10-CM | POA: Diagnosis not present

## 2019-09-27 DIAGNOSIS — J449 Chronic obstructive pulmonary disease, unspecified: Secondary | ICD-10-CM | POA: Diagnosis not present

## 2019-09-27 DIAGNOSIS — M159 Polyosteoarthritis, unspecified: Secondary | ICD-10-CM | POA: Diagnosis not present

## 2019-09-27 DIAGNOSIS — N183 Chronic kidney disease, stage 3 unspecified: Secondary | ICD-10-CM | POA: Diagnosis not present

## 2019-09-27 DIAGNOSIS — I10 Essential (primary) hypertension: Secondary | ICD-10-CM | POA: Diagnosis not present

## 2019-09-27 DIAGNOSIS — G5603 Carpal tunnel syndrome, bilateral upper limbs: Secondary | ICD-10-CM | POA: Diagnosis not present

## 2019-10-03 ENCOUNTER — Encounter (HOSPITAL_COMMUNITY): Payer: Self-pay

## 2019-10-03 ENCOUNTER — Inpatient Hospital Stay (HOSPITAL_COMMUNITY)
Admission: EM | Admit: 2019-10-03 | Discharge: 2019-10-12 | DRG: 856 | Disposition: A | Discharge status: Home Health | Payer: Medicare HMO | Attending: Internal Medicine | Admitting: Internal Medicine

## 2019-10-03 ENCOUNTER — Emergency Department (HOSPITAL_COMMUNITY): Payer: Medicare HMO

## 2019-10-03 ENCOUNTER — Other Ambulatory Visit: Payer: Self-pay

## 2019-10-03 DIAGNOSIS — Z419 Encounter for procedure for purposes other than remedying health state, unspecified: Secondary | ICD-10-CM

## 2019-10-03 DIAGNOSIS — E1122 Type 2 diabetes mellitus with diabetic chronic kidney disease: Secondary | ICD-10-CM | POA: Diagnosis not present

## 2019-10-03 DIAGNOSIS — T8149XA Infection following a procedure, other surgical site, initial encounter: Secondary | ICD-10-CM | POA: Diagnosis not present

## 2019-10-03 DIAGNOSIS — E039 Hypothyroidism, unspecified: Secondary | ICD-10-CM | POA: Diagnosis present

## 2019-10-03 DIAGNOSIS — Z7902 Long term (current) use of antithrombotics/antiplatelets: Secondary | ICD-10-CM

## 2019-10-03 DIAGNOSIS — N281 Cyst of kidney, acquired: Secondary | ICD-10-CM | POA: Diagnosis present

## 2019-10-03 DIAGNOSIS — J449 Chronic obstructive pulmonary disease, unspecified: Secondary | ICD-10-CM | POA: Diagnosis present

## 2019-10-03 DIAGNOSIS — I10 Essential (primary) hypertension: Secondary | ICD-10-CM | POA: Diagnosis present

## 2019-10-03 DIAGNOSIS — N189 Chronic kidney disease, unspecified: Secondary | ICD-10-CM | POA: Diagnosis not present

## 2019-10-03 DIAGNOSIS — Z79890 Hormone replacement therapy (postmenopausal): Secondary | ICD-10-CM

## 2019-10-03 DIAGNOSIS — T8142XA Infection following a procedure, deep incisional surgical site, initial encounter: Secondary | ICD-10-CM | POA: Diagnosis present

## 2019-10-03 DIAGNOSIS — I2510 Atherosclerotic heart disease of native coronary artery without angina pectoris: Secondary | ICD-10-CM | POA: Diagnosis present

## 2019-10-03 DIAGNOSIS — I130 Hypertensive heart and chronic kidney disease with heart failure and stage 1 through stage 4 chronic kidney disease, or unspecified chronic kidney disease: Secondary | ICD-10-CM | POA: Diagnosis present

## 2019-10-03 DIAGNOSIS — K529 Noninfective gastroenteritis and colitis, unspecified: Secondary | ICD-10-CM | POA: Diagnosis present

## 2019-10-03 DIAGNOSIS — D649 Anemia, unspecified: Secondary | ICD-10-CM | POA: Diagnosis present

## 2019-10-03 DIAGNOSIS — E1042 Type 1 diabetes mellitus with diabetic polyneuropathy: Secondary | ICD-10-CM | POA: Diagnosis present

## 2019-10-03 DIAGNOSIS — S8251XD Displaced fracture of medial malleolus of right tibia, subsequent encounter for closed fracture with routine healing: Secondary | ICD-10-CM | POA: Diagnosis not present

## 2019-10-03 DIAGNOSIS — E109 Type 1 diabetes mellitus without complications: Secondary | ICD-10-CM

## 2019-10-03 DIAGNOSIS — Z87440 Personal history of urinary (tract) infections: Secondary | ICD-10-CM

## 2019-10-03 DIAGNOSIS — N184 Chronic kidney disease, stage 4 (severe): Secondary | ICD-10-CM | POA: Diagnosis not present

## 2019-10-03 DIAGNOSIS — N183 Chronic kidney disease, stage 3 unspecified: Secondary | ICD-10-CM | POA: Diagnosis not present

## 2019-10-03 DIAGNOSIS — I69341 Monoplegia of lower limb following cerebral infarction affecting right dominant side: Secondary | ICD-10-CM | POA: Diagnosis not present

## 2019-10-03 DIAGNOSIS — Y831 Surgical operation with implant of artificial internal device as the cause of abnormal reaction of the patient, or of later complication, without mention of misadventure at the time of the procedure: Secondary | ICD-10-CM | POA: Diagnosis present

## 2019-10-03 DIAGNOSIS — R58 Hemorrhage, not elsewhere classified: Secondary | ICD-10-CM | POA: Diagnosis not present

## 2019-10-03 DIAGNOSIS — Z8249 Family history of ischemic heart disease and other diseases of the circulatory system: Secondary | ICD-10-CM

## 2019-10-03 DIAGNOSIS — H548 Legal blindness, as defined in USA: Secondary | ICD-10-CM | POA: Diagnosis present

## 2019-10-03 DIAGNOSIS — I509 Heart failure, unspecified: Secondary | ICD-10-CM | POA: Diagnosis present

## 2019-10-03 DIAGNOSIS — T07XXXA Unspecified multiple injuries, initial encounter: Secondary | ICD-10-CM | POA: Diagnosis not present

## 2019-10-03 DIAGNOSIS — I252 Old myocardial infarction: Secondary | ICD-10-CM | POA: Diagnosis not present

## 2019-10-03 DIAGNOSIS — N1832 Chronic kidney disease, stage 3b: Secondary | ICD-10-CM | POA: Diagnosis present

## 2019-10-03 DIAGNOSIS — Z23 Encounter for immunization: Secondary | ICD-10-CM

## 2019-10-03 DIAGNOSIS — L089 Local infection of the skin and subcutaneous tissue, unspecified: Secondary | ICD-10-CM

## 2019-10-03 DIAGNOSIS — Z833 Family history of diabetes mellitus: Secondary | ICD-10-CM

## 2019-10-03 DIAGNOSIS — K567 Ileus, unspecified: Secondary | ICD-10-CM | POA: Diagnosis not present

## 2019-10-03 DIAGNOSIS — E1010 Type 1 diabetes mellitus with ketoacidosis without coma: Secondary | ICD-10-CM | POA: Diagnosis present

## 2019-10-03 DIAGNOSIS — L03115 Cellulitis of right lower limb: Secondary | ICD-10-CM | POA: Diagnosis present

## 2019-10-03 DIAGNOSIS — Z978 Presence of other specified devices: Secondary | ICD-10-CM | POA: Diagnosis not present

## 2019-10-03 DIAGNOSIS — E1142 Type 2 diabetes mellitus with diabetic polyneuropathy: Secondary | ICD-10-CM | POA: Diagnosis present

## 2019-10-03 DIAGNOSIS — S82391A Other fracture of lower end of right tibia, initial encounter for closed fracture: Secondary | ICD-10-CM | POA: Diagnosis not present

## 2019-10-03 DIAGNOSIS — F329 Major depressive disorder, single episode, unspecified: Secondary | ICD-10-CM | POA: Diagnosis present

## 2019-10-03 DIAGNOSIS — Z794 Long term (current) use of insulin: Secondary | ICD-10-CM | POA: Diagnosis not present

## 2019-10-03 DIAGNOSIS — S99911A Unspecified injury of right ankle, initial encounter: Secondary | ICD-10-CM | POA: Diagnosis present

## 2019-10-03 DIAGNOSIS — J189 Pneumonia, unspecified organism: Secondary | ICD-10-CM | POA: Diagnosis not present

## 2019-10-03 DIAGNOSIS — I129 Hypertensive chronic kidney disease with stage 1 through stage 4 chronic kidney disease, or unspecified chronic kidney disease: Secondary | ICD-10-CM | POA: Diagnosis not present

## 2019-10-03 DIAGNOSIS — S82851D Displaced trimalleolar fracture of right lower leg, subsequent encounter for closed fracture with routine healing: Secondary | ICD-10-CM | POA: Diagnosis not present

## 2019-10-03 DIAGNOSIS — Z20828 Contact with and (suspected) exposure to other viral communicable diseases: Secondary | ICD-10-CM | POA: Diagnosis present

## 2019-10-03 DIAGNOSIS — Z79899 Other long term (current) drug therapy: Secondary | ICD-10-CM

## 2019-10-03 DIAGNOSIS — E1022 Type 1 diabetes mellitus with diabetic chronic kidney disease: Secondary | ICD-10-CM | POA: Diagnosis present

## 2019-10-03 DIAGNOSIS — E875 Hyperkalemia: Secondary | ICD-10-CM | POA: Diagnosis not present

## 2019-10-03 DIAGNOSIS — B9561 Methicillin susceptible Staphylococcus aureus infection as the cause of diseases classified elsewhere: Secondary | ICD-10-CM | POA: Diagnosis present

## 2019-10-03 DIAGNOSIS — E1165 Type 2 diabetes mellitus with hyperglycemia: Secondary | ICD-10-CM | POA: Diagnosis not present

## 2019-10-03 DIAGNOSIS — F418 Other specified anxiety disorders: Secondary | ICD-10-CM | POA: Diagnosis not present

## 2019-10-03 DIAGNOSIS — R109 Unspecified abdominal pain: Secondary | ICD-10-CM | POA: Diagnosis not present

## 2019-10-03 DIAGNOSIS — T8469XA Infection and inflammatory reaction due to internal fixation device of other site, initial encounter: Secondary | ICD-10-CM | POA: Diagnosis not present

## 2019-10-03 DIAGNOSIS — R52 Pain, unspecified: Secondary | ICD-10-CM | POA: Diagnosis not present

## 2019-10-03 DIAGNOSIS — K9189 Other postprocedural complications and disorders of digestive system: Secondary | ICD-10-CM

## 2019-10-03 DIAGNOSIS — Z800 Family history of malignant neoplasm of digestive organs: Secondary | ICD-10-CM

## 2019-10-03 DIAGNOSIS — Z7982 Long term (current) use of aspirin: Secondary | ICD-10-CM

## 2019-10-03 DIAGNOSIS — R739 Hyperglycemia, unspecified: Secondary | ICD-10-CM

## 2019-10-03 DIAGNOSIS — S82871A Displaced pilon fracture of right tibia, initial encounter for closed fracture: Secondary | ICD-10-CM | POA: Diagnosis not present

## 2019-10-03 DIAGNOSIS — S82831D Other fracture of upper and lower end of right fibula, subsequent encounter for closed fracture with routine healing: Secondary | ICD-10-CM | POA: Diagnosis not present

## 2019-10-03 DIAGNOSIS — Z823 Family history of stroke: Secondary | ICD-10-CM

## 2019-10-03 DIAGNOSIS — R1032 Left lower quadrant pain: Secondary | ICD-10-CM | POA: Diagnosis not present

## 2019-10-03 DIAGNOSIS — I639 Cerebral infarction, unspecified: Secondary | ICD-10-CM | POA: Diagnosis not present

## 2019-10-03 DIAGNOSIS — E1065 Type 1 diabetes mellitus with hyperglycemia: Secondary | ICD-10-CM | POA: Diagnosis not present

## 2019-10-03 DIAGNOSIS — S82831A Other fracture of upper and lower end of right fibula, initial encounter for closed fracture: Secondary | ICD-10-CM | POA: Diagnosis not present

## 2019-10-03 DIAGNOSIS — T148XXA Other injury of unspecified body region, initial encounter: Secondary | ICD-10-CM

## 2019-10-03 LAB — CBC WITH DIFFERENTIAL/PLATELET
Abs Immature Granulocytes: 0.05 10*3/uL (ref 0.00–0.07)
Basophils Absolute: 0 10*3/uL (ref 0.0–0.1)
Basophils Relative: 0 %
Eosinophils Absolute: 0 10*3/uL (ref 0.0–0.5)
Eosinophils Relative: 0 %
HCT: 32.5 % — ABNORMAL LOW (ref 36.0–46.0)
Hemoglobin: 10.6 g/dL — ABNORMAL LOW (ref 12.0–15.0)
Immature Granulocytes: 1 %
Lymphocytes Relative: 6 %
Lymphs Abs: 0.6 10*3/uL — ABNORMAL LOW (ref 0.7–4.0)
MCH: 27.6 pg (ref 26.0–34.0)
MCHC: 32.6 g/dL (ref 30.0–36.0)
MCV: 84.6 fL (ref 80.0–100.0)
Monocytes Absolute: 0.6 10*3/uL (ref 0.1–1.0)
Monocytes Relative: 6 %
Neutro Abs: 8.8 10*3/uL — ABNORMAL HIGH (ref 1.7–7.7)
Neutrophils Relative %: 87 %
Platelets: 302 10*3/uL (ref 150–400)
RBC: 3.84 MIL/uL — ABNORMAL LOW (ref 3.87–5.11)
RDW: 13.8 % (ref 11.5–15.5)
WBC: 10 10*3/uL (ref 4.0–10.5)
nRBC: 0 % (ref 0.0–0.2)

## 2019-10-03 LAB — COMPREHENSIVE METABOLIC PANEL
ALT: 11 U/L (ref 0–44)
AST: 11 U/L — ABNORMAL LOW (ref 15–41)
Albumin: 2.9 g/dL — ABNORMAL LOW (ref 3.5–5.0)
Alkaline Phosphatase: 126 U/L (ref 38–126)
Anion gap: 13 (ref 5–15)
BUN: 37 mg/dL — ABNORMAL HIGH (ref 6–20)
CO2: 24 mmol/L (ref 22–32)
Calcium: 9.9 mg/dL (ref 8.9–10.3)
Chloride: 97 mmol/L — ABNORMAL LOW (ref 98–111)
Creatinine, Ser: 2.14 mg/dL — ABNORMAL HIGH (ref 0.44–1.00)
GFR calc Af Amer: 31 mL/min — ABNORMAL LOW (ref >60)
GFR calc non Af Amer: 27 mL/min — ABNORMAL LOW (ref >60)
Glucose, Bld: 345 mg/dL — ABNORMAL HIGH (ref 70–99)
Potassium: 5 mmol/L (ref 3.5–5.1)
Sodium: 134 mmol/L — ABNORMAL LOW (ref 135–145)
Total Bilirubin: 0.4 mg/dL (ref 0.3–1.2)
Total Protein: 7.7 g/dL (ref 6.5–8.1)

## 2019-10-03 LAB — LACTIC ACID, PLASMA: Lactic Acid, Venous: 1.1 mmol/L (ref 0.5–1.9)

## 2019-10-03 MED ORDER — SODIUM CHLORIDE 0.9% FLUSH
3.0000 mL | Freq: Once | INTRAVENOUS | Status: AC
  Administered 2019-10-04: 09:00:00 3 mL via INTRAVENOUS

## 2019-10-03 MED ORDER — ONDANSETRON 4 MG PO TBDP
4.0000 mg | ORAL_TABLET | Freq: Once | ORAL | Status: AC
  Administered 2019-10-03: 4 mg via ORAL
  Filled 2019-10-03: qty 1

## 2019-10-03 NOTE — ED Triage Notes (Signed)
Pt from home with PTAR with c.o greenish/bloddy drainage from right ankle, recent surgery on right ankle 2 months ago, incision opened up tonight and started draining. Pt also c.o n.v for the past 5 days.    Ankle is draining yellow drainage, redness around wound.

## 2019-10-04 ENCOUNTER — Encounter (HOSPITAL_COMMUNITY): Payer: Self-pay | Admitting: Internal Medicine

## 2019-10-04 ENCOUNTER — Emergency Department (HOSPITAL_COMMUNITY): Payer: Medicare HMO

## 2019-10-04 DIAGNOSIS — Z87440 Personal history of urinary (tract) infections: Secondary | ICD-10-CM | POA: Diagnosis not present

## 2019-10-04 DIAGNOSIS — E1122 Type 2 diabetes mellitus with diabetic chronic kidney disease: Secondary | ICD-10-CM | POA: Diagnosis not present

## 2019-10-04 DIAGNOSIS — N1832 Chronic kidney disease, stage 3b: Secondary | ICD-10-CM | POA: Diagnosis present

## 2019-10-04 DIAGNOSIS — E1010 Type 1 diabetes mellitus with ketoacidosis without coma: Secondary | ICD-10-CM | POA: Diagnosis present

## 2019-10-04 DIAGNOSIS — L03115 Cellulitis of right lower limb: Secondary | ICD-10-CM | POA: Diagnosis present

## 2019-10-04 DIAGNOSIS — I252 Old myocardial infarction: Secondary | ICD-10-CM | POA: Diagnosis not present

## 2019-10-04 DIAGNOSIS — H548 Legal blindness, as defined in USA: Secondary | ICD-10-CM | POA: Diagnosis present

## 2019-10-04 DIAGNOSIS — I2510 Atherosclerotic heart disease of native coronary artery without angina pectoris: Secondary | ICD-10-CM | POA: Diagnosis present

## 2019-10-04 DIAGNOSIS — E039 Hypothyroidism, unspecified: Secondary | ICD-10-CM | POA: Diagnosis present

## 2019-10-04 DIAGNOSIS — Z20828 Contact with and (suspected) exposure to other viral communicable diseases: Secondary | ICD-10-CM | POA: Diagnosis present

## 2019-10-04 DIAGNOSIS — K567 Ileus, unspecified: Secondary | ICD-10-CM | POA: Diagnosis not present

## 2019-10-04 DIAGNOSIS — E1165 Type 2 diabetes mellitus with hyperglycemia: Secondary | ICD-10-CM | POA: Diagnosis not present

## 2019-10-04 DIAGNOSIS — E1022 Type 1 diabetes mellitus with diabetic chronic kidney disease: Secondary | ICD-10-CM | POA: Diagnosis present

## 2019-10-04 DIAGNOSIS — E875 Hyperkalemia: Secondary | ICD-10-CM | POA: Diagnosis not present

## 2019-10-04 DIAGNOSIS — I509 Heart failure, unspecified: Secondary | ICD-10-CM | POA: Diagnosis present

## 2019-10-04 DIAGNOSIS — Z23 Encounter for immunization: Secondary | ICD-10-CM | POA: Diagnosis present

## 2019-10-04 DIAGNOSIS — E1065 Type 1 diabetes mellitus with hyperglycemia: Secondary | ICD-10-CM | POA: Diagnosis not present

## 2019-10-04 DIAGNOSIS — L089 Local infection of the skin and subcutaneous tissue, unspecified: Secondary | ICD-10-CM | POA: Diagnosis not present

## 2019-10-04 DIAGNOSIS — S82851D Displaced trimalleolar fracture of right lower leg, subsequent encounter for closed fracture with routine healing: Secondary | ICD-10-CM | POA: Diagnosis not present

## 2019-10-04 DIAGNOSIS — B9561 Methicillin susceptible Staphylococcus aureus infection as the cause of diseases classified elsewhere: Secondary | ICD-10-CM | POA: Diagnosis present

## 2019-10-04 DIAGNOSIS — S99911A Unspecified injury of right ankle, initial encounter: Secondary | ICD-10-CM | POA: Diagnosis present

## 2019-10-04 DIAGNOSIS — T8142XA Infection following a procedure, deep incisional surgical site, initial encounter: Secondary | ICD-10-CM | POA: Diagnosis present

## 2019-10-04 DIAGNOSIS — I130 Hypertensive heart and chronic kidney disease with heart failure and stage 1 through stage 4 chronic kidney disease, or unspecified chronic kidney disease: Secondary | ICD-10-CM | POA: Diagnosis present

## 2019-10-04 DIAGNOSIS — F329 Major depressive disorder, single episode, unspecified: Secondary | ICD-10-CM | POA: Diagnosis present

## 2019-10-04 DIAGNOSIS — J449 Chronic obstructive pulmonary disease, unspecified: Secondary | ICD-10-CM | POA: Diagnosis present

## 2019-10-04 DIAGNOSIS — Z794 Long term (current) use of insulin: Secondary | ICD-10-CM | POA: Diagnosis not present

## 2019-10-04 DIAGNOSIS — T8149XA Infection following a procedure, other surgical site, initial encounter: Secondary | ICD-10-CM | POA: Diagnosis present

## 2019-10-04 DIAGNOSIS — Y831 Surgical operation with implant of artificial internal device as the cause of abnormal reaction of the patient, or of later complication, without mention of misadventure at the time of the procedure: Secondary | ICD-10-CM | POA: Diagnosis present

## 2019-10-04 DIAGNOSIS — T148XXA Other injury of unspecified body region, initial encounter: Secondary | ICD-10-CM | POA: Diagnosis present

## 2019-10-04 DIAGNOSIS — I69341 Monoplegia of lower limb following cerebral infarction affecting right dominant side: Secondary | ICD-10-CM | POA: Diagnosis not present

## 2019-10-04 DIAGNOSIS — K529 Noninfective gastroenteritis and colitis, unspecified: Secondary | ICD-10-CM | POA: Diagnosis present

## 2019-10-04 DIAGNOSIS — E1042 Type 1 diabetes mellitus with diabetic polyneuropathy: Secondary | ICD-10-CM | POA: Diagnosis present

## 2019-10-04 HISTORY — DX: Infection following a procedure, other surgical site, initial encounter: T81.49XA

## 2019-10-04 LAB — URINALYSIS, ROUTINE W REFLEX MICROSCOPIC
Bilirubin Urine: NEGATIVE
Glucose, UA: >=500 mg/dL — AB
Hgb urine dipstick: NEGATIVE
Ketones, ur: 5 mg/dL — AB
Leukocytes,Ua: NEGATIVE
Nitrite: NEGATIVE
Protein, ur: 30 mg/dL — AB
Specific Gravity, Urine: 1.014 (ref 1.005–1.030)
pH: 5 (ref 5.0–8.0)

## 2019-10-04 LAB — CBC
HCT: 28.3 % — ABNORMAL LOW (ref 36.0–46.0)
Hemoglobin: 9.2 g/dL — ABNORMAL LOW (ref 12.0–15.0)
MCH: 28 pg (ref 26.0–34.0)
MCHC: 32.5 g/dL (ref 30.0–36.0)
MCV: 86.3 fL (ref 80.0–100.0)
Platelets: 250 10*3/uL (ref 150–400)
RBC: 3.28 MIL/uL — ABNORMAL LOW (ref 3.87–5.11)
RDW: 14.1 % (ref 11.5–15.5)
WBC: 7.3 10*3/uL (ref 4.0–10.5)
nRBC: 0 % (ref 0.0–0.2)

## 2019-10-04 LAB — GLUCOSE, CAPILLARY
Glucose-Capillary: 454 mg/dL — ABNORMAL HIGH (ref 70–99)
Glucose-Capillary: 283 mg/dL — ABNORMAL HIGH (ref 70–99)

## 2019-10-04 LAB — MRSA PCR SCREENING: MRSA by PCR: NEGATIVE

## 2019-10-04 LAB — C-REACTIVE PROTEIN: CRP: 23.4 mg/dL — ABNORMAL HIGH (ref <1.0)

## 2019-10-04 LAB — SEDIMENTATION RATE: Sed Rate: 110 mm/hr — ABNORMAL HIGH (ref 0–22)

## 2019-10-04 LAB — CREATININE, SERUM
Creatinine, Ser: 2.68 mg/dL — ABNORMAL HIGH (ref 0.44–1.00)
GFR calc Af Amer: 24 mL/min — ABNORMAL LOW (ref >60)
GFR calc non Af Amer: 20 mL/min — ABNORMAL LOW (ref >60)

## 2019-10-04 LAB — SARS CORONAVIRUS 2 (TAT 6-24 HRS): SARS Coronavirus 2: NEGATIVE

## 2019-10-04 MED ORDER — CLOPIDOGREL BISULFATE 75 MG PO TABS
75.0000 mg | ORAL_TABLET | Freq: Every day | ORAL | Status: DC
  Administered 2019-10-07 – 2019-10-12 (×6): 75 mg via ORAL
  Filled 2019-10-04 (×6): qty 1

## 2019-10-04 MED ORDER — ASPIRIN EC 81 MG PO TBEC
81.0000 mg | DELAYED_RELEASE_TABLET | Freq: Every day | ORAL | Status: DC
  Administered 2019-10-04 – 2019-10-12 (×7): 81 mg via ORAL
  Filled 2019-10-04 (×7): qty 1

## 2019-10-04 MED ORDER — INSULIN ASPART 100 UNIT/ML ~~LOC~~ SOLN
5.0000 [IU] | Freq: Three times a day (TID) | SUBCUTANEOUS | Status: DC
  Administered 2019-10-04: 5 [IU] via SUBCUTANEOUS

## 2019-10-04 MED ORDER — INSULIN DETEMIR 100 UNIT/ML ~~LOC~~ SOLN: 20.0000 [IU] | Freq: Every day | SUBCUTANEOUS | Status: DC

## 2019-10-04 MED ORDER — CHLORHEXIDINE GLUCONATE 4 % EX LIQD
60.0000 mL | Freq: Once | CUTANEOUS | Status: AC
  Administered 2019-10-05: 4 via TOPICAL
  Filled 2019-10-04: qty 60

## 2019-10-04 MED ORDER — PIPERACILLIN-TAZOBACTAM 3.375 G IVPB 30 MIN
3.3750 g | Freq: Once | INTRAVENOUS | Status: AC
  Administered 2019-10-04: 3.375 g via INTRAVENOUS
  Filled 2019-10-04: qty 50

## 2019-10-04 MED ORDER — ATROPINE SULFATE 1 % OP SOLN: 1.0000 [drp] | Freq: Two times a day (BID) | OPHTHALMIC | Status: DC

## 2019-10-04 MED ORDER — ATORVASTATIN CALCIUM 80 MG PO TABS: 80.0000 mg | ORAL_TABLET | Freq: Every day | ORAL | Status: DC

## 2019-10-04 MED ORDER — POLYETHYL GLYCOL-PROPYL GLYCOL 0.4-0.3 % OP GEL: 1.0000 "application " | Freq: Three times a day (TID) | OPHTHALMIC | Status: DC | PRN

## 2019-10-04 MED ORDER — ACETAMINOPHEN 325 MG PO TABS
650.0000 mg | ORAL_TABLET | Freq: Four times a day (QID) | ORAL | Status: DC | PRN
  Administered 2019-10-05 – 2019-10-06 (×2): 650 mg via ORAL
  Filled 2019-10-04 (×2): qty 2

## 2019-10-04 MED ORDER — ISOSORBIDE DINITRATE 20 MG PO TABS: 20.0000 mg | ORAL_TABLET | Freq: Two times a day (BID) | ORAL | Status: DC

## 2019-10-04 MED ORDER — INSULIN DETEMIR 100 UNIT/ML ~~LOC~~ SOLN: 30.0000 [IU] | Freq: Two times a day (BID) | SUBCUTANEOUS | Status: DC

## 2019-10-04 MED ORDER — ONDANSETRON HCL 4 MG/2ML IJ SOLN
4.0000 mg | Freq: Once | INTRAMUSCULAR | Status: AC
  Administered 2019-10-04: 4 mg via INTRAVENOUS
  Filled 2019-10-04: qty 2

## 2019-10-04 MED ORDER — SERTRALINE HCL 100 MG PO TABS
100.0000 mg | ORAL_TABLET | Freq: Every day | ORAL | Status: DC
  Administered 2019-10-07 – 2019-10-12 (×6): 100 mg via ORAL
  Filled 2019-10-04 (×6): qty 1

## 2019-10-04 MED ORDER — BUPROPION HCL ER (XL) 150 MG PO TB24: 150.0000 mg | ORAL_TABLET | Freq: Every day | ORAL | Status: DC

## 2019-10-04 MED ORDER — VANCOMYCIN HCL IN DEXTROSE 1-5 GM/200ML-% IV SOLN
1000.0000 mg | INTRAVENOUS | Status: DC
  Filled 2019-10-04: qty 200

## 2019-10-04 MED ORDER — MORPHINE SULFATE (PF) 4 MG/ML IV SOLN
4.0000 mg | Freq: Once | INTRAVENOUS | Status: AC
  Administered 2019-10-04: 4 mg via INTRAVENOUS
  Filled 2019-10-04: qty 1

## 2019-10-04 MED ORDER — PREGABALIN 75 MG PO CAPS
75.0000 mg | ORAL_CAPSULE | Freq: Two times a day (BID) | ORAL | Status: DC
  Administered 2019-10-04 – 2019-10-12 (×14): 75 mg via ORAL
  Filled 2019-10-04 (×14): qty 1

## 2019-10-04 MED ORDER — INSULIN ASPART 100 UNIT/ML ~~LOC~~ SOLN
0.0000 [IU] | Freq: Three times a day (TID) | SUBCUTANEOUS | Status: DC
  Administered 2019-10-04 – 2019-10-05 (×2): 15 [IU] via SUBCUTANEOUS

## 2019-10-04 MED ORDER — LATANOPROST 0.005 % OP SOLN
1.0000 [drp] | Freq: Every evening | OPHTHALMIC | Status: DC
  Administered 2019-10-05 – 2019-10-11 (×6): 1 [drp] via OPHTHALMIC
  Filled 2019-10-04 (×2): qty 2.5

## 2019-10-04 MED ORDER — LEVOTHYROXINE SODIUM 50 MCG PO TABS
50.0000 ug | ORAL_TABLET | Freq: Every day | ORAL | Status: DC
  Administered 2019-10-05 – 2019-10-12 (×8): 50 ug via ORAL
  Filled 2019-10-04 (×8): qty 1

## 2019-10-04 MED ORDER — CLOPIDOGREL BISULFATE 75 MG PO TABS: 75.0000 mg | ORAL_TABLET | Freq: Every day | ORAL | Status: DC

## 2019-10-04 MED ORDER — PREDNISOLONE ACETATE 1 % OP SUSP: 1.0000 [drp] | Freq: Two times a day (BID) | OPHTHALMIC | Status: DC

## 2019-10-04 MED ORDER — ONDANSETRON 4 MG PO TBDP: 4.0000 mg | ORAL_TABLET | Freq: Three times a day (TID) | ORAL | Status: DC | PRN

## 2019-10-04 MED ORDER — ATORVASTATIN CALCIUM 80 MG PO TABS
80.0000 mg | ORAL_TABLET | Freq: Every day | ORAL | Status: DC
  Administered 2019-10-07 – 2019-10-12 (×6): 80 mg via ORAL
  Filled 2019-10-04 (×6): qty 1

## 2019-10-04 MED ORDER — HYDROMORPHONE HCL 1 MG/ML IJ SOLN
0.5000 mg | Freq: Four times a day (QID) | INTRAMUSCULAR | Status: DC | PRN
  Administered 2019-10-04 – 2019-10-12 (×15): 0.5 mg via INTRAVENOUS
  Filled 2019-10-04 (×15): qty 1

## 2019-10-04 MED ORDER — ENSURE PRE-SURGERY PO LIQD
296.0000 mL | Freq: Once | ORAL | Status: DC
  Filled 2019-10-04: qty 296

## 2019-10-04 MED ORDER — ISOSORBIDE DINITRATE 20 MG PO TABS
20.0000 mg | ORAL_TABLET | Freq: Two times a day (BID) | ORAL | Status: DC
  Administered 2019-10-04 – 2019-10-12 (×14): 20 mg via ORAL
  Filled 2019-10-04 (×17): qty 1

## 2019-10-04 MED ORDER — POVIDONE-IODINE 10 % EX SWAB: 2.0000 "application " | Freq: Once | CUTANEOUS | Status: DC

## 2019-10-04 MED ORDER — SERTRALINE HCL 100 MG PO TABS: 100.0000 mg | ORAL_TABLET | Freq: Every day | ORAL | Status: DC

## 2019-10-04 MED ORDER — BUPROPION HCL ER (XL) 300 MG PO TB24
300.0000 mg | ORAL_TABLET | Freq: Every day | ORAL | Status: DC
  Administered 2019-10-04 – 2019-10-12 (×7): 300 mg via ORAL
  Filled 2019-10-04 (×8): qty 1

## 2019-10-04 MED ORDER — VANCOMYCIN HCL 10 G IV SOLR
1750.0000 mg | Freq: Once | INTRAVENOUS | Status: AC
  Administered 2019-10-04: 1750 mg via INTRAVENOUS
  Filled 2019-10-04: qty 1750

## 2019-10-04 MED ORDER — LEVOTHYROXINE SODIUM 50 MCG PO TABS: 50.0000 ug | ORAL_TABLET | Freq: Every day | ORAL | Status: DC

## 2019-10-04 MED ORDER — DORZOLAMIDE HCL-TIMOLOL MAL 2-0.5 % OP SOLN: 1.0000 [drp] | Freq: Two times a day (BID) | OPHTHALMIC | Status: DC

## 2019-10-04 MED ORDER — POLYVINYL ALCOHOL 1.4 % OP SOLN
1.0000 [drp] | Freq: Three times a day (TID) | OPHTHALMIC | Status: DC | PRN
  Administered 2019-10-04: 1 [drp] via OPHTHALMIC
  Filled 2019-10-04: qty 15

## 2019-10-04 MED ORDER — SODIUM CHLORIDE 0.9 % IV BOLUS
500.0000 mL | Freq: Once | INTRAVENOUS | Status: AC
  Administered 2019-10-04: 500 mL via INTRAVENOUS

## 2019-10-04 MED ORDER — PREGABALIN 25 MG PO CAPS: 75.0000 mg | ORAL_CAPSULE | Freq: Two times a day (BID) | ORAL | Status: DC

## 2019-10-04 MED ORDER — HEPARIN SODIUM (PORCINE) 5000 UNIT/ML IJ SOLN
5000.0000 [IU] | Freq: Three times a day (TID) | INTRAMUSCULAR | Status: DC
  Administered 2019-10-04 – 2019-10-12 (×21): 5000 [IU] via SUBCUTANEOUS
  Filled 2019-10-04 (×21): qty 1

## 2019-10-04 NOTE — Consult Note (Signed)
Reason for Consult:Right ankle infection Referring Physician: Coralyn Helling  Deanna Landry is an 48 y.o. female.  HPI: Deanna Landry comes to the ED with a ~7d hx/o increased right ankle pain, redness, swelling, and discharge. She notes it started after she got in the shower around that time but she's not sure that had anything to do with it. She's been running fevers at home to about 102 and has also had significant N/V/diarrhea. Workup for the latter has been negative for cause.  Past Medical History:  Diagnosis Date  . CHF (congestive heart failure) ([place])   . Diabetes mellitus without complication ([place])   . Hypertension   . Myocardial infarct, old   . Renal disorder   . Stroke [facility])     Past Surgical History:  Procedure Laterality Date  . ABDOMINAL HYSTERECTOMY    . EYE SURGERY    . ORIF ANKLE FRACTURE Right 07/26/2019   Procedure: OPEN REDUCTION INTERNAL FIXATION (ORIF) ANKLE FRACTURE;  Surgeon: Shona Needles, MD;  Location: [place];  Service: Orthopedics;  Laterality: Right;  OPEN REDUCTION INTERNAL FIXATION (ORIF) ANKLE FRACTURE   . TOE AMPUTATION      Family History  Problem Relation Age of Onset  . Diabetes Mellitus II Mother   . Stroke Mother   . Stomach cancer Father     Social History:  reports that she has never smoked. She has never used smokeless tobacco. She reports previous alcohol use. She reports previous drug use.  Allergies: No Known Allergies  Medications: I have reviewed the patient's current medications.  Results for orders placed or performed during the hospital encounter of 10/03/19 (from the past 48 hour(s))  Lactic acid, plasma     Status: None   Collection Time: 10/03/19 10:32 PM  Result Value Ref Range   Lactic Acid, Venous 1.1 0.5 - 1.9 mmol/L    Comment: Performed at [facility], [address] [place], [place] [address]  Comprehensive metabolic panel     Status: Abnormal   Collection Time: 10/03/19 10:32 PM  Result Value Ref Range   Sodium 134 (L)  135 - 145 mmol/L   Potassium 5.0 3.5 - 5.1 mmol/L   Chloride 97 (L) 98 - 111 mmol/L   CO2 24 22 - 32 mmol/L   Glucose, Bld 345 (H) 70 - 99 mg/dL   BUN 37 (H) 6 - 20 mg/dL   Creatinine, Ser 2.14 (H) 0.44 - 1.00 mg/dL   Calcium 9.9 8.9 - 10.3 mg/dL   Total Protein 7.7 6.5 - 8.1 g/dL   Albumin 2.9 (L) 3.5 - 5.0 g/dL   AST 11 (L) 15 - 41 U/L   ALT 11 0 - 44 U/L   Alkaline Phosphatase 126 38 - 126 U/L   Total Bilirubin 0.4 0.3 - 1.2 mg/dL   GFR calc non Af Amer 27 (L) >60 mL/min   GFR calc Af Amer 31 (L) >60 mL/min   Anion gap 13 5 - 15    Comment: Performed at [facility], [place] [address] [place], [place] [address]  CBC with Differential     Status: Abnormal   Collection Time: 10/03/19 10:32 PM  Result Value Ref Range   WBC 10.0 4.0 - 10.5 K/uL   RBC 3.84 (L) 3.87 - 5.11 MIL/uL   Hemoglobin 10.6 (L) 12.0 - 15.0 g/dL   HCT 32.5 (L) 36.0 - 46.0 %   MCV 84.6 80.0 - 100.0 fL   MCH 27.6 26.0 -  34.0 pg   MCHC 32.6 30.0 - 36.0 g/dL   RDW 13.8 11.5 - 15.5 %   Platelets 302 150 - 400 K/uL   nRBC 0.0 0.0 - 0.2 %   Neutrophils Relative % 87 %   Neutro Abs 8.8 (H) 1.7 - 7.7 K/uL   Lymphocytes Relative 6 %   Lymphs Abs 0.6 (L) 0.7 - 4.0 K/uL   Monocytes Relative 6 %   Monocytes Absolute 0.6 0.1 - 1.0 K/uL   Eosinophils Relative 0 %   Eosinophils Absolute 0.0 0.0 - 0.5 K/uL   Basophils Relative 0 %   Basophils Absolute 0.0 0.0 - 0.1 K/uL   Immature Granulocytes 1 %   Abs Immature Granulocytes 0.05 0.00 - 0.07 K/uL    Comment: Performed at [facility] [address], [place], [place] [address]    Ct Abdomen Pelvis Wo Contrast  Result Date: 10/04/2019 CLINICAL DATA:  Abdominal pain, primarily left-sided. Diarrhea. Diabetes mellitus. EXAM: CT ABDOMEN AND PELVIS WITHOUT CONTRAST TECHNIQUE: Multidetector CT imaging of the abdomen and pelvis was performed following the standard protocol without IV contrast. Oral contrast was administered. COMPARISON:  None. FINDINGS: Lower  chest: There is slight atelectatic change in the left lung base. No edema or consolidation evident in the lung bases. Hepatobiliary: No focal liver lesions are evident on this noncontrast enhanced study. Gallbladder wall is not appreciably thickened. There is questionable sludge within the gallbladder. There is no appreciable biliary duct dilatation. Pancreas: There is no pancreatic mass or inflammatory focus. Tail of the pancreas appears hypoplastic. Spleen: Spleen measures 13.7 x 13.2 x 5.9 cm with a measured splenic volume of 533 cubic cm. There are occasional small splenic granulomas. Spleen otherwise appears unremarkable on this noncontrast enhanced study. Adrenals/Urinary Tract: Adrenals appear unremarkable bilaterally. Kidneys bilaterally show no evident mass or hydronephrosis on either side. No evident renal or ureteral calculus on either side. Urinary bladder is midline with wall thickness within normal limits. Urinary bladder is mildly distended currently. Stomach/Bowel: There is no appreciable bowel wall or mesenteric thickening. There is no evident diverticulitis. There is no bowel obstruction. Terminal ileum appears unremarkable. There is no demonstrable free air or portal venous air. There is no intramural air. Vascular/Lymphatic: No abdominal aortic aneurysm is evident. There is no appreciable calcification in the abdominal aorta. However, there is extensive calcification in pelvic arterial vessels as well as in multiple mesenteric vessels, particularly throughout the course of the peripheral renal arteries. There is no demonstrable adenopathy in the abdomen or pelvis. Reproductive: Uterus is absent.  No pelvic mass evident. Other: Appendix appears normal. No evident abscess or ascites in the abdomen or pelvis. There is thinning of the rectus muscle in the midline without frank herniation. Musculoskeletal: No blastic or lytic bone lesions. No intramuscular lesions are evident. IMPRESSION: 1. No  evident bowel obstruction. No appreciable diverticulitis. No bowel wall thickening. No abscess in the abdomen or pelvis. Appendix appears normal. 2. Extensive multifocal mesenteric and pelvic arterial vascular calcification, likely a consequence of diabetes mellitus. 3. Prominent spleen. Tiny calcifications likely represents small splenic granulomas. 4. No evident renal or ureteral calculus. No hydronephrosis. Urinary bladder wall thickness is normal. Urinary bladder is somewhat distended currently. 5. Question a degree of sludge in the gallbladder. No gallstones or gallbladder wall thickening evident by CT. 6.  Uterus absent. Electronically Signed   By: Lowella Grip III M.D.   On: 10/04/2019 10:58   Dg Ankle Complete Right  Result Date: 10/03/2019 CLINICAL  DATA:  Recent surgery, concern for infection EXAM: RIGHT ANKLE - COMPLETE 3+ VIEW COMPARISON:  07/26/2019, 07/25/2019 FINDINGS: Surgical plate and fixating screws in the distal fibula with single screw across the distal tibiofibular articulation. Fixating screws within the medial and posterior malleoli of the tibia. Stable appearance of the hardware. No gross osseous destructive change. Fracture lucencies remain but are less distinct. No soft tissue emphysema. IMPRESSION: Status post internal fixation of distal fibular and tibial fractures with intact hardware and no gross osseous destructive change. Hardware appears grossly intact. Some interval fracture healing is noted. Electronically Signed   By: Donavan Foil M.D.   On: 10/03/2019 22:43    Review of Systems  Constitutional: Positive for fever. Negative for chills and weight loss.  HENT: Negative for ear discharge, ear pain, hearing loss and tinnitus.   Eyes: Negative for blurred vision, double vision, photophobia and pain.  Respiratory: Negative for cough, sputum production and shortness of breath.   Cardiovascular: Negative for chest pain.  Gastrointestinal: Positive for diarrhea, nausea  and vomiting. Negative for abdominal pain.  Genitourinary: Negative for dysuria, flank pain, frequency and urgency.  Musculoskeletal: Positive for joint pain (Right ankle). Negative for back pain, falls, myalgias and neck pain.  Neurological: Negative for dizziness, tingling, sensory change, focal weakness, loss of consciousness and headaches.  Endo/Heme/Allergies: Does not bruise/bleed easily.  Psychiatric/Behavioral: Negative for depression, memory loss and substance abuse. The patient is not nervous/anxious.    Blood pressure (!) 156/53, pulse 86, temperature 99.3 F (37.4 C), temperature source Oral, resp. rate 18, SpO2 99 %. Physical Exam  Constitutional: She appears well-developed and well-nourished. No distress.  HENT:  Head: Normocephalic and atraumatic.  Eyes: Conjunctivae are normal. Right eye exhibits no discharge. Left eye exhibits no discharge. No scleral icterus.  Neck: Normal range of motion.  Cardiovascular: Normal rate and regular rhythm.  Respiratory: Effort normal. No respiratory distress.  Musculoskeletal:     Comments: RLE Incision with eschar, no ecchymosis or rash  Ankle erythematous, mod TTP, actively draining purulence  No knee or ankle effusion  Knee stable to varus/ valgus and anterior/posterior stress  Sens DPN, SPN, TN intact  Motor EHL, ext, flex, evers 5/5  DP 2+, PT 0, No significant edema  Neurological: She is alert.  Skin: Skin is warm and dry. She is not diaphoretic.  Psychiatric: She has a normal mood and affect. Her behavior is normal.    Assessment/Plan: Right ankle infection s/p recent ORIF -- Plan I&D, possible hardware removal tomorrow by Dr. Doreatha Martin. NPO after MN. Acute GI distress -- Workup negative thus far. Additional workup/treatment by medical service Multiple medical problems including diabetes mellitus type 2, hypertension, hypothyroidism, chronic kidney disease stage III, history of stroke, and history of legal blindness -- per  internal medicine    Lisette Abu, PA-C Orthopedic Surgery [phone] 10/04/2019, 12:09 PM

## 2019-10-04 NOTE — ED Notes (Signed)
Report given to [facility]

## 2019-10-04 NOTE — Progress Notes (Signed)
Pharmacy Antibiotic Note  Deanna Landry is a 48 y.o. female admitted on 10/03/2019 with right ankle infection.  Pharmacy has been consulted for Vancomycin dosing.  Wound opened and is draining. WBC is within normal limits. Lactic acid is normal.  SCr is elevated at 2.14 (known CKD) but this is improved based on past labs.  Patient received Zosyn 3.375 grams IV x1 in the ED.   Plan: Vancomycin 1750mg  IV x1 Vancomycin 1000 mg IV Q 48 hrs. Goal AUC 400-550. Expected AUC: 439 SCr used: 2.14  Monitor renal function and adjust dosing as needed Plan for Vancomycin level at steady state if remains on therapy.      Temp (24hrs), Avg:99.6 F (37.6 C), Min:99.3 F (37.4 C), Max:99.8 F (37.7 C)  Recent Labs  Lab 10/03/19 2232  WBC 10.0  CREATININE 2.14*  LATICACIDVEN 1.1    CrCl cannot be calculated (Unknown ideal weight.).    No Known Allergies  Antimicrobials this admission: Vancomycin 11/18 >> Zosyn 11/18 x1  Dose adjustments this admission:  Microbiology results: 11/18 COVID - pending  Thank you for allowing pharmacy to be a part of this patient's care.  Sloan Leiter, PharmD, BCPS, BCCCP Clinical Pharmacist Please refer to [facility] for [place] numbers 10/04/2019 1:39 PM

## 2019-10-04 NOTE — ED Provider Notes (Signed)
[facility] EMERGENCY DEPARTMENT Provider Note   CSN: [id number] Arrival date & time: 10/03/19  2151     History   Chief Complaint Chief Complaint  Patient presents with   Wound Infection   Emesis    HPI Deanna Landry is a 48 y.o. female.     48 year old female with past medical history of diabetes presents with complaint of right ankle wound with fever, vomiting, abdominal pain and diarrhea.  Patient reports onset of fever 4 days ago, states temp at home has been 102, last had fever just before EMS picked her up and she took Tylenol.  Reports left lower abdominal pain with watery, nonbloody loose stools as well as nonbloody emesis.  Patient reports urinary frequency without dysuria.  Patient states that she had right ankle surgery 2 months ago, last saw her orthopedic surgeon and home health 2 weeks ago.  Patient states that there is a large scab to the outside of her ankle, her mother is her caretaker and got her into the shower and states that her wound opened up overnight and was draining.  Patient's mother was concerned for infection and called EMS.  Patient states that she is legally blind and is unable to see her ankle.  No other complaints or concerns.     Past Medical History:  Diagnosis Date   CHF (congestive heart failure) (HCC)    Diabetes mellitus without complication ([place])    Hypertension    Myocardial infarct, old    Renal disorder    Stroke [facility])     Patient Active Problem List   Diagnosis Date Noted   Closed right pilon fracture, initial encounter 07/26/2019   Diabetes mellitus type 2 in obese ([place]) 07/26/2019   Hypertension 07/26/2019   Pneumonia 03/15/2019   Type 1 DM with CKD and hypertension ([place]) 03/15/2019   Acute kidney injury superimposed on CKD ([place]) 03/15/2019   Suicidal ideation 03/15/2019   Acute respiratory failure with hypoxia ([place]) 03/15/2019   Adjustment disorder with mixed anxiety and depressed mood  03/11/2019   Fever 03/10/2019   Sepsis ([place]) 03/10/2019   MDD (major depressive disorder), severe ([place]) 03/09/2019   Major depression, recurrent ([place]) 03/09/2019   Diabetic polyneuropathy ([place]) 05/04/2016   Neck pain 04/21/2016    Past Surgical History:  Procedure Laterality Date   ABDOMINAL HYSTERECTOMY     EYE SURGERY     ORIF ANKLE FRACTURE Right 07/26/2019   Procedure: OPEN REDUCTION INTERNAL FIXATION (ORIF) ANKLE FRACTURE;  Surgeon: Shona Needles, MD;  Location: [facility];  Service: Orthopedics;  Laterality: Right;  OPEN REDUCTION INTERNAL FIXATION (ORIF) ANKLE FRACTURE    TOE AMPUTATION       OB History   No obstetric history on file.      Home Medications    Prior to Admission medications   Medication Sig Start Date End Date Taking? Authorizing Provider  ACCU-CHEK AVIVA PLUS test strip USE TO CHECK BLOOD SUGAR 4 TIMES A DAY 04/07/19   [provider]  amLODipine (NORVASC) 10 MG tablet Take 10 mg by mouth daily. 01/15/19   [provider]  aspirin EC 81 MG tablet Take 81 mg by mouth daily.    [provider]  atorvastatin (LIPITOR) 80 MG tablet Take 80 mg by mouth daily. 12/28/18   [provider]  atropine 1 % ophthalmic solution Place 1 drop into the right eye 2 (two) times daily. 01/15/19   [provider]  buPROPion (WELLBUTRIN XL)  150 MG 24 hr tablet Take 150 mg by mouth daily. Pt  States takes 300mg  03/02/19   [provider]  ciprofloxacin (CIPRO) 250 MG tablet Take 250 mg by mouth 2 (two) times daily.    [provider]  clopidogrel (PLAVIX) 75 MG tablet Take 75 mg by mouth daily. 01/15/19   [provider]  [employer] ZINC OXIDE 20 % ointment Apply 1 application topically as needed for irritation.  03/16/19   [provider]  dorzolamide-timolol (COSOPT) 22.3-6.8 MG/ML ophthalmic solution Place 1 drop into both eyes 2 (two) times daily.  03/06/19   [provider]  isosorbide dinitrate  (ISORDIL) 20 MG tablet Take 20 mg by mouth 2 (two) times daily. 01/27/19   [provider]  latanoprost (XALATAN) 0.005 % ophthalmic solution Place 1 drop into the left eye every evening.  01/23/19   [provider]  LEVEMIR 100 UNIT/ML injection Inject 30 Units into the skin 2 (two) times daily.  12/07/18   [provider]  levothyroxine (SYNTHROID) 50 MCG tablet Take 50 mcg by mouth daily. 02/02/19   [provider]  liver oil-zinc oxide (DESITIN) 40 % ointment Apply topically as needed for irritation. Patient not taking: Reported on 08/31/2019 03/16/19   Oretha Milch D, MD  metoCLOPramide (REGLAN) 5 MG tablet Take 1 tablet (5 mg total) by mouth 3 (three) times daily before meals. 03/16/19   Desiree Hane, MD  NOVOLOG FLEXPEN 100 UNIT/ML FlexPen Inject 10 Units into the skin 3 (three) times daily. 01/23/19   [provider]  ondansetron (ZOFRAN) 4 MG tablet Take 1 tablet (4 mg total) by mouth every 6 (six) hours as needed for nausea. Patient not taking: Reported on 08/31/2019 03/16/19   Oretha Milch D, MD  pantoprazole (PROTONIX) 40 MG tablet Take 40 mg by mouth daily. 12/29/18   [provider]  Polyethyl Glycol-Propyl Glycol (SYSTANE) 0.4-0.3 % GEL ophthalmic gel Place 1 application into both eyes 3 (three) times daily as needed (dry eye).    [provider]  prednisoLONE acetate (PRED FORTE) 1 % ophthalmic suspension Place 1 drop into the right eye 2 (two) times daily.  12/15/18   [provider]  pregabalin (LYRICA) 75 MG capsule Take 75 mg by mouth 2 (two) times daily. 02/11/19   [provider]  sertraline (ZOLOFT) 100 MG tablet Take 100 mg by mouth daily. 01/27/19   [provider]  TRULICITY 1.5 0000000 SOPN Inject 1.5 mg into the skin every Monday.  02/07/19   [provider]    Family History Family History  Problem Relation Age of Onset   Diabetes Mellitus II Mother    Stroke Mother     Stomach cancer Father     Social History Social History   Tobacco Use   Smoking status: Never Smoker   Smokeless tobacco: Never Used  Substance Use Topics   Alcohol use: Not Currently   Drug use: Not Currently     Allergies   Patient has no known allergies.   Review of Systems Review of Systems  Constitutional: Positive for fever.  Respiratory: Negative for shortness of breath.   Cardiovascular: Negative for chest pain.  Gastrointestinal: Positive for abdominal pain, diarrhea, nausea and vomiting.  Musculoskeletal: Positive for arthralgias, gait problem and myalgias.  Skin: Positive for wound.  Allergic/Immunologic: Positive for immunocompromised state.  Neurological: Negative for weakness.  Psychiatric/Behavioral: Negative for confusion.  All other systems reviewed and are negative.    Physical  Exam Updated Vital Signs BP 133/67    Pulse 91    Temp 99.3 F (37.4 C) (Oral)    Resp 18    LMP  (LMP Unknown) Comment: hysterectomy 2014   SpO2 100%   Physical Exam Vitals signs and nursing note reviewed.  Constitutional:      General: She is not in acute distress.    Appearance: She is well-developed. She is not diaphoretic.  HENT:     Head: Normocephalic and atraumatic.  Cardiovascular:     Rate and Rhythm: Normal rate and regular rhythm.     Heart sounds: Normal heart sounds.  Pulmonary:     Effort: Pulmonary effort is normal.     Breath sounds: Normal breath sounds.  Abdominal:     Palpations: Abdomen is soft.     Tenderness: There is abdominal tenderness in the left lower quadrant.  Musculoskeletal:        General: Tenderness present.       Feet:  Skin:    General: Skin is warm and dry.     Findings: Erythema present.  Neurological:     Mental Status: She is alert and oriented to person, place, and time.  Psychiatric:        Behavior: Behavior normal.      ED Treatments / Results  Labs (all labs ordered are listed, but only abnormal results  are displayed) Labs Reviewed  COMPREHENSIVE METABOLIC PANEL - Abnormal; Notable for the following components:      Result Value   Sodium 134 (*)    Chloride 97 (*)    Glucose, Bld 345 (*)    BUN 37 (*)    Creatinine, Ser 2.14 (*)    Albumin 2.9 (*)    AST 11 (*)    GFR calc non Af Amer 27 (*)    GFR calc Af Amer 31 (*)    All other components within normal limits  CBC WITH DIFFERENTIAL/PLATELET - Abnormal; Notable for the following components:   RBC 3.84 (*)    Hemoglobin 10.6 (*)    HCT 32.5 (*)    Neutro Abs 8.8 (*)    Lymphs Abs 0.6 (*)    All other components within normal limits  SARS CORONAVIRUS 2 (TAT 6-24 HRS)  AEROBIC CULTURE (SUPERFICIAL SPECIMEN)  LACTIC ACID, PLASMA  URINALYSIS, ROUTINE W REFLEX MICROSCOPIC  SEDIMENTATION RATE  C-REACTIVE PROTEIN    EKG None  Radiology Ct Abdomen Pelvis Wo Contrast  Result Date: 10/04/2019 CLINICAL DATA:  Abdominal pain, primarily left-sided. Diarrhea. Diabetes mellitus. EXAM: CT ABDOMEN AND PELVIS WITHOUT CONTRAST TECHNIQUE: Multidetector CT imaging of the abdomen and pelvis was performed following the standard protocol without IV contrast. Oral contrast was administered. COMPARISON:  None. FINDINGS: Lower chest: There is slight atelectatic change in the left lung base. No edema or consolidation evident in the lung bases. Hepatobiliary: No focal liver lesions are evident on this noncontrast enhanced study. Gallbladder wall is not appreciably thickened. There is questionable sludge within the gallbladder. There is no appreciable biliary duct dilatation. Pancreas: There is no pancreatic mass or inflammatory focus. Tail of the pancreas appears hypoplastic. Spleen: Spleen measures 13.7 x 13.2 x 5.9 cm with a measured splenic volume of 533 cubic cm. There are occasional small splenic granulomas. Spleen otherwise appears unremarkable on this noncontrast enhanced study. Adrenals/Urinary Tract: Adrenals appear unremarkable bilaterally.  Kidneys bilaterally show no evident mass or hydronephrosis on either side. No evident renal or ureteral calculus on either side. Urinary bladder  is midline with wall thickness within normal limits. Urinary bladder is mildly distended currently. Stomach/Bowel: There is no appreciable bowel wall or mesenteric thickening. There is no evident diverticulitis. There is no bowel obstruction. Terminal ileum appears unremarkable. There is no demonstrable free air or portal venous air. There is no intramural air. Vascular/Lymphatic: No abdominal aortic aneurysm is evident. There is no appreciable calcification in the abdominal aorta. However, there is extensive calcification in pelvic arterial vessels as well as in multiple mesenteric vessels, particularly throughout the course of the peripheral renal arteries. There is no demonstrable adenopathy in the abdomen or pelvis. Reproductive: Uterus is absent.  No pelvic mass evident. Other: Appendix appears normal. No evident abscess or ascites in the abdomen or pelvis. There is thinning of the rectus muscle in the midline without frank herniation. Musculoskeletal: No blastic or lytic bone lesions. No intramuscular lesions are evident. IMPRESSION: 1. No evident bowel obstruction. No appreciable diverticulitis. No bowel wall thickening. No abscess in the abdomen or pelvis. Appendix appears normal. 2. Extensive multifocal mesenteric and pelvic arterial vascular calcification, likely a consequence of diabetes mellitus. 3. Prominent spleen. Tiny calcifications likely represents small splenic granulomas. 4. No evident renal or ureteral calculus. No hydronephrosis. Urinary bladder wall thickness is normal. Urinary bladder is somewhat distended currently. 5. Question a degree of sludge in the gallbladder. No gallstones or gallbladder wall thickening evident by CT. 6.  Uterus absent. Electronically Signed   By: Lowella Grip III M.D.   On: 10/04/2019 10:58   Dg Ankle Complete  Right  Result Date: 10/03/2019 CLINICAL DATA:  Recent surgery, concern for infection EXAM: RIGHT ANKLE - COMPLETE 3+ VIEW COMPARISON:  07/26/2019, 07/25/2019 FINDINGS: Surgical plate and fixating screws in the distal fibula with single screw across the distal tibiofibular articulation. Fixating screws within the medial and posterior malleoli of the tibia. Stable appearance of the hardware. No gross osseous destructive change. Fracture lucencies remain but are less distinct. No soft tissue emphysema. IMPRESSION: Status post internal fixation of distal fibular and tibial fractures with intact hardware and no gross osseous destructive change. Hardware appears grossly intact. Some interval fracture healing is noted. Electronically Signed   By: Donavan Foil M.D.   On: 10/03/2019 22:43    Procedures Procedures (including critical care time)  Medications Ordered in ED Medications  piperacillin-tazobactam (ZOSYN) IVPB 3.375 g (has no administration in time range)  vancomycin (VANCOCIN) 1,750 mg in sodium chloride 0.9 % 500 mL IVPB (has no administration in time range)  vancomycin (VANCOCIN) IVPB 1000 mg/200 mL premix (has no administration in time range)  sodium chloride flush (NS) 0.9 % injection 3 mL (3 mLs Intravenous Given 10/04/19 0915)  ondansetron (ZOFRAN-ODT) disintegrating tablet 4 mg (4 mg Oral Given 10/03/19 2230)  sodium chloride 0.9 % bolus 500 mL (0 mLs Intravenous Stopped 10/04/19 0911)  morphine 4 MG/ML injection 4 mg (4 mg Intravenous Given 10/04/19 0910)  ondansetron (ZOFRAN) injection 4 mg (4 mg Intravenous Given 10/04/19 0909)     Initial Impression / Assessment and Plan / ED Course  I have reviewed the triage vital signs and the nursing notes.  Pertinent labs & imaging results that were available during my care of the patient were reviewed by me and considered in my medical decision making (see chart for details).  Clinical Course as of Oct 03 1400  Wed Oct 04, 2019  1126  48yo female with complaint of right lower leg infection, reports fever of 102 at home for the past 4  days, drainage from right leg wound ORIF with Dr. Doreatha Martin 07/26/2019. Patient is blind, unable to see the wound herself, her mother provides her care. Patient reports taking a shower a few days ago and a "large piece of scab" came off in her sleep followed by drainage.  Also reports diarrhea and abdominal pain, found to have LLQ tenderness on exam, CT abd/pelvis with PO contrast is unremarkable. Labs without significant change from baseline include CBC and CMP, lactic acid negative.  Case discussed with Dr. Laverta Baltimore, ER attending, recommends ortho eval for right lower leg cellulitis over hardware.    [LM]  1129 Case discussed with Silvestre Gunner, PA-C with ortho who will come and see patient.   [LM]  1333 Patient was seen by orthopedics who plans to take patient to the operating tomorrow, requests hospitalist to admit, recommends vancomycin and Zosyn for antibiotics and request Covid test.   [LM]  1400 Case discussed with hospitalist will consult for admission.   [LM]    Clinical Course User Index [LM] Tacy Learn, PA-C      Final Clinical Impressions(s) / ED Diagnoses   Final diagnoses:  Wound infection    ED Discharge Orders    None       Tacy Learn, PA-C 10/04/19 1401    Long, Wonda [facility], MD 10/05/19 1409

## 2019-10-04 NOTE — Anesthesia Preprocedure Evaluation (Addendum)
Anesthesia Evaluation  Patient identified by MRN, date of birth, ID band Patient awake    Reviewed: Allergy & Precautions, NPO status , Patient's Chart, lab work & pertinent test results  Airway Mallampati: II  TM Distance: >3 FB Neck ROM: Full    Dental no notable dental hx. (+) Teeth Intact, Dental Advisory Given   Pulmonary neg pulmonary ROS,    Pulmonary exam normal breath sounds clear to auscultation       Cardiovascular hypertension, Pt. on medications + Past MI and +CHF  Normal cardiovascular exam Rhythm:Regular Rate:Normal     Neuro/Psych PSYCHIATRIC DISORDERS Depression CVA    GI/Hepatic negative GI ROS, Neg liver ROS,   Endo/Other  diabetes, Type 1  Renal/GU CRF and Renal InsufficiencyRenal diseaseK+ 5.0 Cr 2.68     Musculoskeletal negative musculoskeletal ROS (+)   Abdominal   Peds  Hematology Hgb 9.2 Plt 250   Anesthesia Other Findings Pt legally blind  Reproductive/Obstetrics                           Anesthesia Physical Anesthesia Plan  ASA: IV  Anesthesia Plan: General   Post-op Pain Management:    Induction: Intravenous  PONV Risk Score and Plan: Treatment may vary due to age or medical condition  Airway Management Planned: Oral ETT  Additional Equipment: None  Intra-op Plan:   Post-operative Plan:   Informed Consent: I have reviewed the patients History and Physical, chart, labs and discussed the procedure including the risks, benefits and alternatives for the proposed anesthesia with the patient or authorized representative who has indicated his/her understanding and acceptance.     Dental advisory given  Plan Discussed with: CRNA  Anesthesia Plan Comments:        Anesthesia Quick Evaluation

## 2019-10-04 NOTE — H&P (Signed)
Date: 10/04/2019               Patient Name:  Deanna Landry MRN: [id number]  DOB: [birth year] Age / Sex: 48 y.o., female   PCP: Cher Nakai, MD         Medical Service: Internal Medicine Teaching Service         Attending Physician: Dr. Velna Ochs, MD    First Contact: Marva Panda, MD, Loralyn [place] Pager: [place] [phone])  Second Contact: Eileen Stanford, MD, [place] Pager: [phone])       After Hours (After 5p/  First Contact Pager: [phone]  weekends / holidays): Second Contact Pager: [phone]   Chief Complaint: right ankle wound   History of Present Illness: Deanna Landry is a  48 y.o. female w/ PMH significant for CHF, HTN, DMII, CKDIII, CVA, MI, COPD and legal blindness (R>L) presenting with a right ankle wound. She was admitted to the hospital on 07/26/2019 and had an ORIF of closed right pilon fracture. She was subsequently discharged to acute rehab from which she returned home approximately 2 weeks ago. She has been mostly been non-weightbearing on the right foot.  She was in her usual state of health until about one and half weeks ago when she began experiencing pain at her right ankle. She reports that she was trying to get in the shower and may have hit her leg on something. She has had pain in the leg since then. She notes that her mother tried to dress it and wrap it at home but it continued to be painful. She also endorses fevers with Tmax 102F for which she has been taking Tylenol with some relief of symptoms. She has had associated decreased oral intake and has experienced nausea and NBNB emesis with last episode of emesis being this morning but remains nauseous which she attributes to the pain. She also notices watery and loose diarrhea for the past four days and notes that her PCP started her on ciprofloxacin for a suspected UTI a few weeks ago.  She has had generalized malaise but denies any dizziness or lightheadedness. She is able to make urine depending on how much fluid she drinks,  endorsing increased urinary frequency but denies any dysuria. She denies any headaches, acute vision changes, chest pain, shortness of breath, abdominal pain or sensory changes.     ED course:  Patient presented to the ED with right lower leg infection and fever. She also endorsed diarrhea and abdominal pain. CBC, CMP, lactic acid and CT abdomen/pelvis with contrast negative. Orthopedic surgery consulted for the right lower leg infection who plan to take patient to OR tomorrow for I&D and recommend for IV antibiotics. Patient admitted to internal medicine for further evaluation and management of medical conditions.    Meds:  Current Meds  Medication Sig   amLODipine (NORVASC) 10 MG tablet Take 10 mg by mouth daily.   aspirin EC 81 MG tablet Take 81 mg by mouth daily.   atorvastatin (LIPITOR) 80 MG tablet Take 80 mg by mouth daily.   clopidogrel (PLAVIX) 75 MG tablet Take 75 mg by mouth daily.   isosorbide dinitrate (ISORDIL) 20 MG tablet Take 20 mg by mouth 2 (two) times daily.   latanoprost (XALATAN) 0.005 % ophthalmic solution Place 1 drop into the left eye every evening.    LEVEMIR 100 UNIT/ML injection Inject 36 Units into the skin 2 (two) times daily.    levothyroxine (SYNTHROID) 50 MCG tablet Take 50 mcg by mouth  daily.   NOVOLOG FLEXPEN 100 UNIT/ML FlexPen Inject 10 Units into the skin 3 (three) times daily.   ondansetron (ZOFRAN) 4 MG tablet Take 1 tablet (4 mg total) by mouth every 6 (six) hours as needed for nausea.   pantoprazole (PROTONIX) 40 MG tablet Take 40 mg by mouth daily.   Polyethyl Glycol-Propyl Glycol (SYSTANE) 0.4-0.3 % GEL ophthalmic gel Place 1 application into both eyes 3 (three) times daily as needed (dry eye).   pregabalin (LYRICA) 75 MG capsule Take 75 mg by mouth 2 (two) times daily.   sertraline (ZOLOFT) 100 MG tablet Take 100 mg by mouth daily.   TRULICITY 1.5 0000000 SOPN Inject 1.5 mg into the skin every Monday.    Allergies: Allergies as  of 10/03/2019   (No Known Allergies)   Past Medical History:  Past Medical History:  Diagnosis Date   CHF (congestive heart failure) (HCC)    Diabetes mellitus without complication ([place])    Hypertension    Myocardial infarct, old    Renal disorder    Stroke [facility])    Surgical History: Past Surgical History:  Procedure Laterality Date   ABDOMINAL HYSTERECTOMY     EYE SURGERY     ORIF ANKLE FRACTURE Right 07/26/2019   Procedure: OPEN REDUCTION INTERNAL FIXATION (ORIF) ANKLE FRACTURE;  Surgeon: Shona Needles, MD;  Location: [place];  Service: Orthopedics;  Laterality: Right;  OPEN REDUCTION INTERNAL FIXATION (ORIF) ANKLE FRACTURE    TOE AMPUTATION       Family History:  Family History  Problem Relation Age of Onset   Diabetes Mellitus II Mother    Stroke Mother    Stomach cancer Father    Congestive Heart Failure Father    Diabetes Sister    Congestive Heart Failure Maternal Grandfather      Social History:  Social History   Tobacco Use   Smoking status: Never Smoker   Smokeless tobacco: Never Used  Substance Use Topics   Alcohol use: Not Currently   Drug use: Not Currently   Patient is legally blind and lives at home with her mother. She is dependent on her mother for most of her ADL's. She currently uses a wheelchair to get around. She denies smoking, alcohol or illicit drug use.   Review of Systems: A complete ROS was negative except as per HPI.   Physical Exam: Blood pressure (!) 127/52, pulse 88, temperature 99.3 F (37.4 C), temperature source Oral, resp. rate 18, SpO2 100 %. Physical Exam Vitals signs and nursing note reviewed.  Constitutional:      General: She is not in acute distress.    Appearance: Normal appearance. She is not ill-appearing or diaphoretic.  HENT:     Head: Normocephalic and atraumatic.  Eyes:     General: No scleral icterus.    Extraocular Movements: Extraocular movements intact.     Conjunctiva/sclera:  Conjunctivae normal.  Neck:     Musculoskeletal: Normal range of motion and neck supple.  Cardiovascular:     Rate and Rhythm: Normal rate and regular rhythm.     Pulses: Normal pulses.     Heart sounds: Normal heart sounds. No murmur. No friction rub. No gallop.   Pulmonary:     Effort: Pulmonary effort is normal. No respiratory distress.     Breath sounds: Normal breath sounds. No wheezing, rhonchi or rales.  Abdominal:     General: Bowel sounds are normal. There is no distension.     Palpations: Abdomen is soft.  Tenderness: There is abdominal tenderness. There is no guarding.     Comments: Tenderness to palpation of LLQ  Musculoskeletal: Normal range of motion.     Comments: RLE: incision with eschar noted on lateral aspect of leg with surrounding erythema, warmth and tenderness to palpation; active purulent drainage noted on examination    Skin:    General: Skin is warm and dry.     Capillary Refill: Capillary refill takes less than 2 seconds.  Neurological:     General: No focal deficit present.     Mental Status: She is alert and oriented to person, place, and time. Mental status is at baseline.     Sensory: No sensory deficit.     Motor: No weakness.     EKG: not available   X-RAY RIGHT ANKLE: IMPRESSION: Status post internal fixation of distal fibular and tibial fractures with intact hardware and no gross osseous destructive change. Hardware appears grossly intact. Some interval fracture healing is noted.  CT ABDOMEN PELVIS WO CONTRAST:  IMPRESSION: 1. No evident bowel obstruction. No appreciable diverticulitis. No bowel wall thickening. No abscess in the abdomen or pelvis. Appendix appears normal. 2. Extensive multifocal mesenteric and pelvic arterial vascular calcification, likely a consequence of diabetes mellitus. 3. Prominent spleen. Tiny calcifications likely represents small splenic granulomas. 4. No evident renal or ureteral calculus. No  hydronephrosis. Urinary bladder wall thickness is normal. Urinary bladder is somewhat distended currently. 5. Question a degree of sludge in the gallbladder. No gallstones or gallbladder wall thickening evident by CT. 6.  Uterus absent.   Assessment & Plan by Problem:  Ms. Mones is a 48 year old female with history of CHF, DMII, HTN, CKDIII, prior CVA with right lower extremity weakness, CAD, COPD and legal blindness s/p ORIF of right ankle in September presenting with a right ankle infection for one week duration.   Right ankle infection s/p recent ORIF:  Patient had recent ORIF in September for right pilon fracture and has been mostly wheelchair bound. Approximately one week ago, she was trying to get in the shower and may have hit something resulting in her right ankle stitches/scab coming off. She notes that her mother tried to dress her wound; however, her pain continued to worsen. She also endorses fever up to 102F treated with Tylenol. Patient afebrile without leukocytosis on labs. CRP elevated at 23.4. Patient has a black eschar with surrounding erythema, warmth and tenderness to palpation on the lateral aspect of right lower extremity with purulent drainage noted. X-ray without any destructive changes. She has received one dose of vancomycin and zosyn in the ED. She was evaluated by orthopedic surgery and recommended for irrigation debridement with possible wound vac placement tomorrow.  - I&D with Dr. Doreatha Martin in AM - IV vancomycin per pharmacy dosing  - Dilaudid 0.5mg  q6h prn - f/u wound culture - CBC daily   GI upset:  Patient has had watery loose stools for approximately 4 days. She also reports nausea and NBNB emesis with her last episode being earlier today. She endorses decreased PO intake as well. She continues to have nausea. She was recently on ciprofloxacin for presumed UTI by her PCP. On examination, patient has tenderness to palpation of left lower quadrant.  CT abdomen  negative for acute intraabdominal pathology of patient's GI distress. At this point, will continue to monitor.  - Zofran ODT q8h prn  - If continues to have diarrhea, will consider GI panel   DMII: Recent HbA1c in September 2020  6.8. Patient is on Levemir 36U and Novolog 10U tid with SSI.  - Levemir 20U qd + SSI - CBG monitoring  History of CHF: Patient with history of CHF on Isordil 20mg  bid. Patient is euvolemic on examination.  - Continue Isordil 20mg  bid - Cardiac monitoring   Hypothyroidism: Continue levothyroxine 65mcg qd  CKDIII: Patient with Hx of CKDIII and "renal cyst" on right kidney. BUN/Cr 37/2.14 with GFR 27. This appears to be close to her baseline of Cr 2.34.  - BMP daily - Avoid nephrotoxic agents    Hx of CVA with RLE weakness:  Patient with history of CVA resulting in speech deficits and her being unable to use her right lower extremity. She reports that she had to relearn how to speak and still notices her right leg dragging at times.  - Continue atorvastatin 80mg  qd - Continue aspirin 81mg  qd and plavix 75mg  qd  Hx of Depression:  Continue wellbutrin and zoloft.   FEN: Carb modified, NPO after midnight  Code: DNR/DNI VTE Prophylaxis: Heparin 5000U q8h  Dispo: Admit patient to Inpatient with expected length of stay greater than 2 midnights.  Signed: Harvie Heck, MD  Internal Medicine, PGY-1 10/04/2019, 3:14 PM  Pager: [phone]

## 2019-10-04 NOTE — ED Notes (Signed)
Pt is eating some of her lunch at this time

## 2019-10-05 DIAGNOSIS — Z7902 Long term (current) use of antithrombotics/antiplatelets: Secondary | ICD-10-CM

## 2019-10-05 DIAGNOSIS — I509 Heart failure, unspecified: Secondary | ICD-10-CM

## 2019-10-05 DIAGNOSIS — L03115 Cellulitis of right lower limb: Secondary | ICD-10-CM

## 2019-10-05 DIAGNOSIS — I252 Old myocardial infarction: Secondary | ICD-10-CM

## 2019-10-05 DIAGNOSIS — E039 Hypothyroidism, unspecified: Secondary | ICD-10-CM

## 2019-10-05 DIAGNOSIS — Z8781 Personal history of (healed) traumatic fracture: Secondary | ICD-10-CM

## 2019-10-05 DIAGNOSIS — K529 Noninfective gastroenteritis and colitis, unspecified: Secondary | ICD-10-CM

## 2019-10-05 DIAGNOSIS — F419 Anxiety disorder, unspecified: Secondary | ICD-10-CM

## 2019-10-05 DIAGNOSIS — Z79890 Hormone replacement therapy (postmenopausal): Secondary | ICD-10-CM

## 2019-10-05 DIAGNOSIS — I69341 Monoplegia of lower limb following cerebral infarction affecting right dominant side: Secondary | ICD-10-CM

## 2019-10-05 DIAGNOSIS — Z79899 Other long term (current) drug therapy: Secondary | ICD-10-CM

## 2019-10-05 DIAGNOSIS — Z7982 Long term (current) use of aspirin: Secondary | ICD-10-CM

## 2019-10-05 DIAGNOSIS — J449 Chronic obstructive pulmonary disease, unspecified: Secondary | ICD-10-CM

## 2019-10-05 DIAGNOSIS — E1065 Type 1 diabetes mellitus with hyperglycemia: Secondary | ICD-10-CM

## 2019-10-05 DIAGNOSIS — H548 Legal blindness, as defined in USA: Secondary | ICD-10-CM

## 2019-10-05 DIAGNOSIS — B9561 Methicillin susceptible Staphylococcus aureus infection as the cause of diseases classified elsewhere: Secondary | ICD-10-CM

## 2019-10-05 DIAGNOSIS — I130 Hypertensive heart and chronic kidney disease with heart failure and stage 1 through stage 4 chronic kidney disease, or unspecified chronic kidney disease: Secondary | ICD-10-CM

## 2019-10-05 DIAGNOSIS — E1022 Type 1 diabetes mellitus with diabetic chronic kidney disease: Secondary | ICD-10-CM

## 2019-10-05 DIAGNOSIS — Z66 Do not resuscitate: Secondary | ICD-10-CM

## 2019-10-05 DIAGNOSIS — F339 Major depressive disorder, recurrent, unspecified: Secondary | ICD-10-CM

## 2019-10-05 DIAGNOSIS — E875 Hyperkalemia: Secondary | ICD-10-CM

## 2019-10-05 DIAGNOSIS — Z794 Long term (current) use of insulin: Secondary | ICD-10-CM

## 2019-10-05 DIAGNOSIS — N184 Chronic kidney disease, stage 4 (severe): Secondary | ICD-10-CM

## 2019-10-05 LAB — BASIC METABOLIC PANEL
Anion gap: 16 — ABNORMAL HIGH (ref 5–15)
Anion gap: 16 — ABNORMAL HIGH (ref 5–15)
Anion gap: 10 (ref 5–15)
Anion gap: 12 (ref 5–15)
Anion gap: 10 (ref 5–15)
BUN: 49 mg/dL — ABNORMAL HIGH (ref 6–20)
BUN: 46 mg/dL — ABNORMAL HIGH (ref 6–20)
BUN: 43 mg/dL — ABNORMAL HIGH (ref 6–20)
BUN: 43 mg/dL — ABNORMAL HIGH (ref 6–20)
BUN: 38 mg/dL — ABNORMAL HIGH (ref 6–20)
CO2: 20 mmol/L — ABNORMAL LOW (ref 22–32)
CO2: 20 mmol/L — ABNORMAL LOW (ref 22–32)
CO2: 22 mmol/L (ref 22–32)
CO2: 24 mmol/L (ref 22–32)
CO2: 22 mmol/L (ref 22–32)
Calcium: 8.5 mg/dL — ABNORMAL LOW (ref 8.9–10.3)
Calcium: 8.8 mg/dL — ABNORMAL LOW (ref 8.9–10.3)
Calcium: 8.4 mg/dL — ABNORMAL LOW (ref 8.9–10.3)
Calcium: 8.9 mg/dL (ref 8.9–10.3)
Calcium: 8.2 mg/dL — ABNORMAL LOW (ref 8.9–10.3)
Chloride: 92 mmol/L — ABNORMAL LOW (ref 98–111)
Chloride: 96 mmol/L — ABNORMAL LOW (ref 98–111)
Chloride: 101 mmol/L (ref 98–111)
Chloride: 99 mmol/L (ref 98–111)
Chloride: 97 mmol/L — ABNORMAL LOW (ref 98–111)
Creatinine, Ser: 2.95 mg/dL — ABNORMAL HIGH (ref 0.44–1.00)
Creatinine, Ser: 2.58 mg/dL — ABNORMAL HIGH (ref 0.44–1.00)
Creatinine, Ser: 2.34 mg/dL — ABNORMAL HIGH (ref 0.44–1.00)
Creatinine, Ser: 2.3 mg/dL — ABNORMAL HIGH (ref 0.44–1.00)
Creatinine, Ser: 2.08 mg/dL — ABNORMAL HIGH (ref 0.44–1.00)
GFR calc Af Amer: 21 mL/min — ABNORMAL LOW (ref >60)
GFR calc Af Amer: 25 mL/min — ABNORMAL LOW (ref >60)
GFR calc Af Amer: 28 mL/min — ABNORMAL LOW (ref >60)
GFR calc Af Amer: 28 mL/min — ABNORMAL LOW (ref >60)
GFR calc Af Amer: 32 mL/min — ABNORMAL LOW (ref >60)
GFR calc non Af Amer: 18 mL/min — ABNORMAL LOW (ref >60)
GFR calc non Af Amer: 21 mL/min — ABNORMAL LOW (ref >60)
GFR calc non Af Amer: 24 mL/min — ABNORMAL LOW (ref >60)
GFR calc non Af Amer: 24 mL/min — ABNORMAL LOW (ref >60)
GFR calc non Af Amer: 28 mL/min — ABNORMAL LOW (ref >60)
Glucose, Bld: 647 mg/dL (ref 70–99)
Glucose, Bld: 442 mg/dL — ABNORMAL HIGH (ref 70–99)
Glucose, Bld: 301 mg/dL — ABNORMAL HIGH (ref 70–99)
Glucose, Bld: 155 mg/dL — ABNORMAL HIGH (ref 70–99)
Glucose, Bld: 263 mg/dL — ABNORMAL HIGH (ref 70–99)
Potassium: 5.7 mmol/L — ABNORMAL HIGH (ref 3.5–5.1)
Potassium: 5.3 mmol/L — ABNORMAL HIGH (ref 3.5–5.1)
Potassium: 3.8 mmol/L (ref 3.5–5.1)
Potassium: 3.9 mmol/L (ref 3.5–5.1)
Potassium: 4.6 mmol/L (ref 3.5–5.1)
Sodium: 128 mmol/L — ABNORMAL LOW (ref 135–145)
Sodium: 132 mmol/L — ABNORMAL LOW (ref 135–145)
Sodium: 133 mmol/L — ABNORMAL LOW (ref 135–145)
Sodium: 135 mmol/L (ref 135–145)
Sodium: 129 mmol/L — ABNORMAL LOW (ref 135–145)

## 2019-10-05 LAB — IRON AND TIBC
Iron: 20 ug/dL — ABNORMAL LOW (ref 28–170)
Saturation Ratios: 10 % — ABNORMAL LOW (ref 10.4–31.8)
TIBC: 210 ug/dL — ABNORMAL LOW (ref 250–450)
UIBC: 190 ug/dL

## 2019-10-05 LAB — CBC
HCT: 26.2 % — ABNORMAL LOW (ref 36.0–46.0)
Hemoglobin: 8.3 g/dL — ABNORMAL LOW (ref 12.0–15.0)
MCH: 27.8 pg (ref 26.0–34.0)
MCHC: 31.7 g/dL (ref 30.0–36.0)
MCV: 87.6 fL (ref 80.0–100.0)
Platelets: 238 10*3/uL (ref 150–400)
RBC: 2.99 MIL/uL — ABNORMAL LOW (ref 3.87–5.11)
RDW: 14.1 % (ref 11.5–15.5)
WBC: 7.2 10*3/uL (ref 4.0–10.5)
nRBC: 0 % (ref 0.0–0.2)

## 2019-10-05 LAB — GLUCOSE, CAPILLARY
Glucose-Capillary: 592 mg/dL (ref 70–99)
Glucose-Capillary: >600 mg/dL (ref 70–99)
Glucose-Capillary: 548 mg/dL (ref 70–99)
Glucose-Capillary: 474 mg/dL — ABNORMAL HIGH (ref 70–99)
Glucose-Capillary: 440 mg/dL — ABNORMAL HIGH (ref 70–99)
Glucose-Capillary: 366 mg/dL — ABNORMAL HIGH (ref 70–99)
Glucose-Capillary: 405 mg/dL — ABNORMAL HIGH (ref 70–99)
Glucose-Capillary: 256 mg/dL — ABNORMAL HIGH (ref 70–99)
Glucose-Capillary: 276 mg/dL — ABNORMAL HIGH (ref 70–99)
Glucose-Capillary: 189 mg/dL — ABNORMAL HIGH (ref 70–99)
Glucose-Capillary: 164 mg/dL — ABNORMAL HIGH (ref 70–99)
Glucose-Capillary: 147 mg/dL — ABNORMAL HIGH (ref 70–99)
Glucose-Capillary: 160 mg/dL — ABNORMAL HIGH (ref 70–99)
Glucose-Capillary: 175 mg/dL — ABNORMAL HIGH (ref 70–99)
Glucose-Capillary: 245 mg/dL — ABNORMAL HIGH (ref 70–99)

## 2019-10-05 LAB — HEMOGLOBIN A1C
Hgb A1c MFr Bld: 7.7 % — ABNORMAL HIGH (ref 4.8–5.6)
Mean Plasma Glucose: 174.29 mg/dL

## 2019-10-05 LAB — FERRITIN: Ferritin: 254 ng/mL (ref 11–307)

## 2019-10-05 MED ORDER — CEFAZOLIN SODIUM-DEXTROSE 2-4 GM/100ML-% IV SOLN
2.0000 g | INTRAVENOUS | Status: DC
  Filled 2019-10-05: qty 100

## 2019-10-05 MED ORDER — INSULIN ASPART 100 UNIT/ML ~~LOC~~ SOLN: 7.0000 [IU] | Freq: Three times a day (TID) | SUBCUTANEOUS | Status: DC

## 2019-10-05 MED ORDER — INSULIN GLARGINE 100 UNIT/ML ~~LOC~~ SOLN
30.0000 [IU] | SUBCUTANEOUS | Status: DC
  Administered 2019-10-05: 18:00:00 30 [IU] via SUBCUTANEOUS
  Filled 2019-10-05 (×2): qty 0.3

## 2019-10-05 MED ORDER — INSULIN ASPART 100 UNIT/ML ~~LOC~~ SOLN
0.0000 [IU] | Freq: Every day | SUBCUTANEOUS | Status: DC
  Administered 2019-10-05: 20:00:00 2 [IU] via SUBCUTANEOUS

## 2019-10-05 MED ORDER — POTASSIUM CHLORIDE 10 MEQ/100ML IV SOLN
10.0000 meq | INTRAVENOUS | Status: AC
  Administered 2019-10-05 (×3): 10 meq via INTRAVENOUS
  Filled 2019-10-05 (×3): qty 100

## 2019-10-05 MED ORDER — INSULIN ASPART 100 UNIT/ML ~~LOC~~ SOLN: 5.0000 [IU] | Freq: Three times a day (TID) | SUBCUTANEOUS | Status: DC

## 2019-10-05 MED ORDER — LACTATED RINGERS IV SOLN
INTRAVENOUS | Status: DC
  Administered 2019-10-05 – 2019-10-09 (×6): via INTRAVENOUS

## 2019-10-05 MED ORDER — INSULIN REGULAR(HUMAN) IN NACL 100-0.9 UT/100ML-% IV SOLN
INTRAVENOUS | Status: DC
  Administered 2019-10-05: 11:00:00 7.5 [IU]/h via INTRAVENOUS
  Filled 2019-10-05: qty 100

## 2019-10-05 MED ORDER — INSULIN REGULAR(HUMAN) IN NACL 100-0.9 UT/100ML-% IV SOLN: INTRAVENOUS | Status: DC

## 2019-10-05 MED ORDER — INSULIN REGULAR(HUMAN) IN NACL 100-0.9 UT/100ML-% IV SOLN
INTRAVENOUS | Status: DC
  Filled 2019-10-05: qty 100

## 2019-10-05 MED ORDER — PROPOFOL 10 MG/ML IV BOLUS
INTRAVENOUS | Status: AC
  Filled 2019-10-05: qty 40

## 2019-10-05 MED ORDER — INSULIN REGULAR(HUMAN) IN NACL 100-0.9 UT/100ML-% IV SOLN
INTRAVENOUS | Status: DC
  Administered 2019-10-05: 7.5 [IU]/h via INTRAVENOUS
  Filled 2019-10-05: qty 100

## 2019-10-05 MED ORDER — DEXTROSE-NACL 5-0.45 % IV SOLN
INTRAVENOUS | Status: DC
  Administered 2019-10-05 – 2019-10-06 (×2): via INTRAVENOUS

## 2019-10-05 MED ORDER — MIDAZOLAM HCL 2 MG/2ML IJ SOLN
INTRAMUSCULAR | Status: AC
  Filled 2019-10-05: qty 2

## 2019-10-05 MED ORDER — DEXTROSE 50 % IV SOLN: 0.0000 mL | INTRAVENOUS | Status: DC | PRN

## 2019-10-05 MED ORDER — DEXTROSE-NACL 5-0.45 % IV SOLN: INTRAVENOUS | Status: DC

## 2019-10-05 MED ORDER — INSULIN DETEMIR 100 UNIT/ML ~~LOC~~ SOLN
30.0000 [IU] | Freq: Every day | SUBCUTANEOUS | Status: DC
  Filled 2019-10-05: qty 0.3

## 2019-10-05 MED ORDER — INSULIN ASPART 100 UNIT/ML ~~LOC~~ SOLN: 0.0000 [IU] | Freq: Three times a day (TID) | SUBCUTANEOUS | Status: DC

## 2019-10-05 MED ORDER — LACTATED RINGERS IV BOLUS
1000.0000 mL | Freq: Once | INTRAVENOUS | Status: AC
  Administered 2019-10-05: 07:00:00 1000 mL via INTRAVENOUS

## 2019-10-05 NOTE — Progress Notes (Signed)
Recheck CBG after bolus per attending.

## 2019-10-05 NOTE — Progress Notes (Addendum)
Inpatient Diabetes Program Recommendations  AACE/ADA: New Consensus Statement on Inpatient Glycemic Control (2015)  Target Ranges:  Prepandial:   less than 140 mg/dL      Peak postprandial:   less than 180 mg/dL (1-2 hours)      Critically ill patients:  140 - 180 mg/dL   Lab Results  Component Value Date   GLUCAP >600 (HH) 10/05/2019   HGBA1C 7.7 (H) 10/05/2019    Review of Glycemic Control Results for Deanna Landry, Deanna Landry (MRN [id number]) as of 10/05/2019 09:14  Ref. Range 10/04/2019 22:18 10/05/2019 05:47 10/05/2019 06:32  Glucose-Capillary Latest Ref Range: 70 - 99 mg/dL 283 (H) 592 (HH) >600 (HH)   Diabetes history: Type 1 DM Outpatient Diabetes medications: Levemir 36 units BID, Novolog 10 units TID, Trulicity 1.5 mg Qwk Current orders for Inpatient glycemic control: Novolog 15 units x 1, IV insulin orders per Endotool  Inpatient Diabetes Program Recommendations:    Recommending hyperglycemic crisis order set and change DM to type 1 DM.  Spoke with patient regarding outpatient management of DM. Verified patient is a type 1 DM and is followed by Dr Truman Hayward, PCP. Reports multiple episode of low blood sugars that contributed to her fall.  Reviewed patient's current A1c of 7.7%. Explained what a A1c is and what it measures. Also reviewed goal A1c with patient, importance of good glucose control @ home, and blood sugar goals. Reviewed patho of DM, need for insulin, missing basal dose since hospitalization, impact of blood sugars on risk for infection, DKA, vascular changes and commodities.  Patient has a meter at home and reports her CBG range (lows-200s mg/dL).  Discussed hypoglycemia, reviewed when to call MD, and reviewed role of endocrinology. Will plan to attach list to DC summary. May need insulin adjustment at discharge.   MD team at bedside. Discussion with providers on Endotool, awaiting BMET results this AM, concern for DKA, verified type 1 DM and need for basal insulin.   @0830 -  difficulty with endotool start given 5C missing from [facility] list. [facility] notified and will plan to add unit. Discussion with [name], Therapist, sports. Plan for patient to go to 4E. Will place patient in Endotool under 4E.  Thanks, Bronson Curb, MSN, RNC-OB Diabetes Coordinator [phone] (8a-5p)

## 2019-10-05 NOTE — Progress Notes (Addendum)
OT Cancellation Note  Patient Details Name: Deanna Landry MRN: [id number] DOB: [birth year]   Cancelled Treatment:     Spoke with RN who states patient is going to be transferred to [facility], having difficulty with insulin drip. Also per chart review patient was scheduled for surgery today. Will re-attempt as time permits/as appropriate.   [facility] OT office: [place] 10/05/2019, 9:54 AM

## 2019-10-05 NOTE — Progress Notes (Signed)
Patient given  Pre-surgery clear carbohydrate drink. Tolerated it well.

## 2019-10-05 NOTE — Progress Notes (Signed)
  Date: 10/05/2019  Patient name: Kayle  record number: [id number]  Date of birth: 1971/04/18        I have seen and evaluated this patient and I have discussed the plan of care with the house staff. Please see their note for complete details. I concur with their findings.  Please see my attested H&P from earlier today.   Velna Ochs, MD 10/05/2019, 2:34 PM

## 2019-10-05 NOTE — Progress Notes (Signed)
Pt arrived to 4e from [place]. Pt oriented to room and staff. VSS. Telemetry box applied and CCMD notified. Pt on insulin drip. Endotool activated and glucose taken upon arrival. No adjustments needed. Pt ordered lunch. Will continue to monitor.

## 2019-10-05 NOTE — Progress Notes (Signed)
Dr. Doreatha Martin and Dr. Valma Cava aware of patient's blood sugar.  Per Dr. Valma Cava, patient needs to be started on EndoTool.  [name], RN on [facility] aware.

## 2019-10-05 NOTE — Consult Note (Signed)
   [facility] CM Inpatient Consult   10/05/2019  Deanna Landry [birth year] [id number]    [facility] Active status:   Patient is currently active with [facility] Care Management.She waspreviously outreached by[facility] RN CM coordinator for EMMI follow-up calls.  Patient has been currently outreached per insurance referral for transition of care by[facility] CM coordinator.    This patient will receive a post hospital call and will be evaluated for assessments and disease process educationif transitioning to home.  Patient was checked for 21% high risk score for unplanned readmission and hospitalizations as a benefit from her [facility] insurance plan.  Chart reviewed andMD note dated 11/19/20reveals as: Deanna Landry is a 48 year old female with history of CHF, DMII, HTN, CKDIII, prior CVA with right lower extremity weakness, CAD, COPD and legal blindness s/p ORIF of right ankle in September,   presenting with a right ankle cellulitis for one week duration. (Right ankle cellulitis with Staph aureus s/p recent ORIF; Hyperglycemia in setting of Type I DM, early DKA Gastroenteritis)  Her primary care provider isDr. Cher Nakai with [facility].  Plan:Will follow progress and recommendations for disposition with inpatient [facility] team and be made aware that [facility] CM following. Will notify [facility] RN CMof disposition/ needs.   For additional questions or referrals,please contact:  Keaisha Sublette A. Napolean Sia, BSN, RN-BC [facility] Liaison Cell: [phone]

## 2019-10-05 NOTE — Progress Notes (Signed)
CRITICAL VALUE ALERT  Critical Value: CBG 592  Date & Time Notied: 10/05/19 @0556  Provider Notified: Yes IMTS on call Resident  Orders Received/Actions taken:none, gave 15units of insulin per Sliding scale

## 2019-10-05 NOTE — Progress Notes (Signed)
Preop notified about patient's blood sugar beng critically high. Attending ordered Lactated ringers bolus and instructed to hold off insulin drip for now until surgeon or anesthesiology give [place] their feedback.

## 2019-10-05 NOTE — Progress Notes (Signed)
PT Cancellation Note  Patient Details Name: Deanna Landry MRN: [id number] DOB: 23-Feb-1971   Cancelled Treatment:    Reason Eval/Treat Not Completed: Other (comment) per chart review, patient scheduled for surgery this morning- holding for now due to procedure, will follow acutely and attempt to return when patient is appropriate and if time/schedule allow.    Windell Norfolk, DPT, PN1   Supplemental Physical Therapist [facility]    Pager [phone] Acute Rehab Office [phone]

## 2019-10-05 NOTE — Progress Notes (Signed)
Subjective: HD#1. Deanna Landry was evaluated at bedside this morning. She reports improvement in leg pain. She does endorse some nausea but reports it is improved from prior and has not had any episodes of emesis. She reports feeling well overall but notes elevated blood sugars this morning. We discussed that goal was to optimize her blood glucose levels for her surgery. All questions and concerns were addressed.    Objective:  Vital signs in last 24 hours: Vitals:   10/04/19 1600 10/04/19 1640 10/04/19 2028 10/05/19 0537  BP: 121/66 108/62 (!) 120/52 (!) 127/58  Pulse:  89 80 84  Resp:  17 15 16   Temp:  98.7 F (37.1 C) 98.7 F (37.1 C) 99.1 F (37.3 C)  TempSrc:  Oral Oral Oral  SpO2:  98% 96% 94%   Physical Exam Vitals signs and nursing note reviewed.  Constitutional:      General: She is not in acute distress.    Appearance: Normal appearance. She is not ill-appearing or diaphoretic.  Cardiovascular:     Rate and Rhythm: Normal rate and regular rhythm.     Pulses: Normal pulses.     Heart sounds: Normal heart sounds. No murmur. No friction rub. No gallop.   Abdominal:     General: Bowel sounds are normal. There is no distension.     Palpations: Abdomen is soft.     Tenderness: There is no abdominal tenderness. There is no guarding or rebound.  Musculoskeletal: Normal range of motion.     Comments: Right lower extremity wrapped   Skin:    General: Skin is warm and dry.     Capillary Refill: Capillary refill takes less than 2 seconds.  Neurological:     Mental Status: She is alert and oriented to person, place, and time.     Assessment/Plan:  Deanna Landry is a 48 year old female with history of CHF, DMII, HTN, CKDIII, prior CVA with right lower extremity weakness, CAD, COPD and legal blindness s/p ORIF of right ankle in September presenting with a right ankle cellulitis for one week duration.   Right ankle cellulitis with Staph aureus s/p recent ORIF: Patient presented  with right ankle cellulitis for one week duration. Her right lower extremity is currently in wrapping. She reports improvement in pain. Per orthopedics, plan is for I&D with possible wound vac placement. Wound culture thusfar with moderate amount of staph aureus. She is IV vancomycin. However, due to her significant hyperglycemia this morning, patient will have her procedure tomorrow. - I&D tomorrow with Dr. Doreatha Martin - Continue IV vancomycin per pharmacy dosing - Dilaudid 0.5mg  q6h prn - f/u sensitivities for abx narrowing - CBC daily  - PT/OT evaluation post-op   Hyperglycemia in setting of Type I DM, early DKA: CBG's this morning significantly elevated >500. Initial UA in the ED with significant glucosuria, proteinuria and mild ketonuria. Her hyperglycemia is likely multifactorial from holding basal insulin dose and also from her right leg infection. Given her UA and GI symptoms on presentation, likely she was in early DKA. She continues to have some nausea today but improved from prior. Na 132, K 5.3, CO2 20, AG 16.  Patient given 15U novolog and 1L bolus of LR with mild improvement in her CBGs. However, she will need Endotool for further management of her insulin gtt.  - Insulin management with endotool - Maintenance fluids: LR at 75 cc/hr - CBG q1h   Gastroenteritis: Patient presented with nausea, vomiting, and diarrhea with decreased PO  intake for 4 days duration. She reports improvement in nausea and has not had any episodes of vomiting or diarrhea since admission. Will continue to monitor for now. If she develops diarrhea, will get C.diff testing given recent antibiotic use.  - Zofran ODT q8h prn  - Continue to monitor   CKDIII: Patient baseline sCr 2.34. This AM, her Cr 2.94 that improved to 2.58 with fluids.  - Continue to monitor - Avoid nephrotoxic agents - BMP daily   Hx of CVA with RLE weakness: Patient with right lower extremity weakness secondary to prior CVA.  - Continue  atorvastatin 80mg  qd - Continue aspirin 81mg  qd and plavix 75mg  qd   Dispo: Anticipated discharge pending clinical improvement.   Harvie Heck, MD  Internal Medicine, PGY-1 10/05/2019, 7:05 AM Pager: [phone]

## 2019-10-06 ENCOUNTER — Inpatient Hospital Stay (HOSPITAL_COMMUNITY): Payer: Medicare HMO | Admitting: Certified Registered"

## 2019-10-06 ENCOUNTER — Encounter (HOSPITAL_COMMUNITY)
Admission: EM | Disposition: A | Discharge status: Home Health | Payer: Self-pay | Source: Home / Self Care | Attending: Internal Medicine

## 2019-10-06 ENCOUNTER — Encounter (HOSPITAL_COMMUNITY): Payer: Self-pay

## 2019-10-06 ENCOUNTER — Inpatient Hospital Stay (HOSPITAL_COMMUNITY): Payer: Medicare HMO

## 2019-10-06 DIAGNOSIS — N183 Chronic kidney disease, stage 3 unspecified: Secondary | ICD-10-CM

## 2019-10-06 DIAGNOSIS — I2510 Atherosclerotic heart disease of native coronary artery without angina pectoris: Secondary | ICD-10-CM

## 2019-10-06 HISTORY — PX: I & D EXTREMITY: SHX5045

## 2019-10-06 HISTORY — PX: HARDWARE REMOVAL: SHX979

## 2019-10-06 LAB — CBC
HCT: 27.5 % — ABNORMAL LOW (ref 36.0–46.0)
Hemoglobin: 8.8 g/dL — ABNORMAL LOW (ref 12.0–15.0)
MCH: 27.8 pg (ref 26.0–34.0)
MCHC: 32 g/dL (ref 30.0–36.0)
MCV: 87 fL (ref 80.0–100.0)
Platelets: 217 10*3/uL (ref 150–400)
RBC: 3.16 MIL/uL — ABNORMAL LOW (ref 3.87–5.11)
RDW: 13.9 % (ref 11.5–15.5)
WBC: 5.2 10*3/uL (ref 4.0–10.5)
nRBC: 0 % (ref 0.0–0.2)

## 2019-10-06 LAB — GLUCOSE, CAPILLARY
Glucose-Capillary: 436 mg/dL — ABNORMAL HIGH (ref 70–99)
Glucose-Capillary: 448 mg/dL — ABNORMAL HIGH (ref 70–99)
Glucose-Capillary: 396 mg/dL — ABNORMAL HIGH (ref 70–99)
Glucose-Capillary: 251 mg/dL — ABNORMAL HIGH (ref 70–99)
Glucose-Capillary: 102 mg/dL — ABNORMAL HIGH (ref 70–99)
Glucose-Capillary: 142 mg/dL — ABNORMAL HIGH (ref 70–99)
Glucose-Capillary: 83 mg/dL (ref 70–99)
Glucose-Capillary: 94 mg/dL (ref 70–99)
Glucose-Capillary: 121 mg/dL — ABNORMAL HIGH (ref 70–99)
Glucose-Capillary: 161 mg/dL — ABNORMAL HIGH (ref 70–99)
Glucose-Capillary: 191 mg/dL — ABNORMAL HIGH (ref 70–99)
Glucose-Capillary: 185 mg/dL — ABNORMAL HIGH (ref 70–99)
Glucose-Capillary: 202 mg/dL — ABNORMAL HIGH (ref 70–99)
Glucose-Capillary: 211 mg/dL — ABNORMAL HIGH (ref 70–99)
Glucose-Capillary: 243 mg/dL — ABNORMAL HIGH (ref 70–99)
Glucose-Capillary: 206 mg/dL — ABNORMAL HIGH (ref 70–99)
Glucose-Capillary: 189 mg/dL — ABNORMAL HIGH (ref 70–99)

## 2019-10-06 LAB — SURGICAL PCR SCREEN
MRSA, PCR: NEGATIVE
Staphylococcus aureus: NEGATIVE

## 2019-10-06 LAB — AEROBIC CULTURE W GRAM STAIN (SUPERFICIAL SPECIMEN)

## 2019-10-06 LAB — BASIC METABOLIC PANEL
Anion gap: 10 (ref 5–15)
BUN: 37 mg/dL — ABNORMAL HIGH (ref 6–20)
CO2: 23 mmol/L (ref 22–32)
Calcium: 8.6 mg/dL — ABNORMAL LOW (ref 8.9–10.3)
Chloride: 97 mmol/L — ABNORMAL LOW (ref 98–111)
Creatinine, Ser: 2.07 mg/dL — ABNORMAL HIGH (ref 0.44–1.00)
GFR calc Af Amer: 32 mL/min — ABNORMAL LOW (ref >60)
GFR calc non Af Amer: 28 mL/min — ABNORMAL LOW (ref >60)
Glucose, Bld: 440 mg/dL — ABNORMAL HIGH (ref 70–99)
Potassium: 5.4 mmol/L — ABNORMAL HIGH (ref 3.5–5.1)
Sodium: 130 mmol/L — ABNORMAL LOW (ref 135–145)

## 2019-10-06 LAB — VITAMIN D 25 HYDROXY (VIT D DEFICIENCY, FRACTURES): Vit D, 25-Hydroxy: 10.95 ng/mL — ABNORMAL LOW (ref 30–100)

## 2019-10-06 SURGERY — IRRIGATION AND DEBRIDEMENT EXTREMITY
Anesthesia: General | Site: Leg Lower | Laterality: Right

## 2019-10-06 MED ORDER — DEXTROSE-NACL 5-0.45 % IV SOLN
INTRAVENOUS | Status: DC
  Administered 2019-10-06: 11:00:00 via INTRAVENOUS

## 2019-10-06 MED ORDER — HYDROMORPHONE HCL 1 MG/ML IJ SOLN
0.2500 mg | INTRAMUSCULAR | Status: DC | PRN
  Administered 2019-10-06 (×2): 0.5 mg via INTRAVENOUS

## 2019-10-06 MED ORDER — ROCURONIUM BROMIDE 10 MG/ML (PF) SYRINGE
PREFILLED_SYRINGE | INTRAVENOUS | Status: DC | PRN
  Administered 2019-10-06: 20 mg via INTRAVENOUS

## 2019-10-06 MED ORDER — DEXTROSE-NACL 5-0.45 % IV SOLN
INTRAVENOUS | Status: DC
  Administered 2019-10-06: 19:00:00 via INTRAVENOUS

## 2019-10-06 MED ORDER — METHOCARBAMOL 500 MG PO TABS
500.0000 mg | ORAL_TABLET | Freq: Four times a day (QID) | ORAL | Status: DC | PRN
  Administered 2019-10-09 – 2019-10-12 (×7): 500 mg via ORAL
  Filled 2019-10-06 (×7): qty 1

## 2019-10-06 MED ORDER — INSULIN DETEMIR 100 UNIT/ML ~~LOC~~ SOLN
25.0000 [IU] | Freq: Two times a day (BID) | SUBCUTANEOUS | Status: DC
  Filled 2019-10-06: qty 0.25

## 2019-10-06 MED ORDER — INSULIN REGULAR(HUMAN) IN NACL 100-0.9 UT/100ML-% IV SOLN
INTRAVENOUS | Status: DC
  Administered 2019-10-06: 23:00:00 2.6 [IU]/h via INTRAVENOUS
  Administered 2019-10-06: 21:00:00 4 [IU]/h via INTRAVENOUS
  Administered 2019-10-06: 23:00:00 2.6 [IU]/h via INTRAVENOUS
  Administered 2019-10-06: 20:00:00 2.4 [IU]/h via INTRAVENOUS
  Administered 2019-10-07: 1.4 [IU]/h via INTRAVENOUS

## 2019-10-06 MED ORDER — OXYCODONE HCL 5 MG PO TABS
5.0000 mg | ORAL_TABLET | ORAL | Status: DC | PRN
  Administered 2019-10-06 – 2019-10-10 (×10): 5 mg via ORAL
  Filled 2019-10-06 (×9): qty 1

## 2019-10-06 MED ORDER — MIDAZOLAM HCL 2 MG/2ML IJ SOLN
INTRAMUSCULAR | Status: AC
  Filled 2019-10-06: qty 2

## 2019-10-06 MED ORDER — INSULIN ASPART 100 UNIT/ML ~~LOC~~ SOLN
0.0000 [IU] | Freq: Three times a day (TID) | SUBCUTANEOUS | Status: DC
  Administered 2019-10-07: 3 [IU] via SUBCUTANEOUS
  Administered 2019-10-07 (×2): 4 [IU] via SUBCUTANEOUS
  Administered 2019-10-09: 5 [IU] via SUBCUTANEOUS

## 2019-10-06 MED ORDER — SUGAMMADEX SODIUM 200 MG/2ML IV SOLN
INTRAVENOUS | Status: DC | PRN
  Administered 2019-10-06: 100 mg via INTRAVENOUS

## 2019-10-06 MED ORDER — PHENYLEPHRINE 40 MCG/ML (10ML) SYRINGE FOR IV PUSH (FOR BLOOD PRESSURE SUPPORT)
PREFILLED_SYRINGE | INTRAVENOUS | Status: AC
  Filled 2019-10-06: qty 20

## 2019-10-06 MED ORDER — ACETAMINOPHEN 10 MG/ML IV SOLN
INTRAVENOUS | Status: AC
  Filled 2019-10-06: qty 100

## 2019-10-06 MED ORDER — ONDANSETRON HCL 4 MG/2ML IJ SOLN
INTRAMUSCULAR | Status: DC | PRN
  Administered 2019-10-06: 4 mg via INTRAVENOUS

## 2019-10-06 MED ORDER — ONDANSETRON HCL 4 MG/2ML IJ SOLN
INTRAMUSCULAR | Status: AC
  Filled 2019-10-06: qty 6

## 2019-10-06 MED ORDER — PROPOFOL 10 MG/ML IV BOLUS
INTRAVENOUS | Status: DC | PRN
  Administered 2019-10-06: 160 mg via INTRAVENOUS

## 2019-10-06 MED ORDER — OXYCODONE HCL 5 MG PO TABS
ORAL_TABLET | ORAL | Status: AC
  Filled 2019-10-06: qty 1

## 2019-10-06 MED ORDER — LIDOCAINE 2% (20 MG/ML) 5 ML SYRINGE
INTRAMUSCULAR | Status: DC | PRN
  Administered 2019-10-06: 80 mg via INTRAVENOUS

## 2019-10-06 MED ORDER — OXYCODONE HCL 5 MG/5ML PO SOLN: 5.0000 mg | Freq: Once | ORAL | Status: AC | PRN

## 2019-10-06 MED ORDER — OXYCODONE HCL 5 MG PO TABS
5.0000 mg | ORAL_TABLET | Freq: Once | ORAL | Status: AC | PRN
  Administered 2019-10-06: 16:00:00 5 mg via ORAL

## 2019-10-06 MED ORDER — INSULIN ASPART 100 UNIT/ML ~~LOC~~ SOLN
0.0000 [IU] | Freq: Every day | SUBCUTANEOUS | Status: DC
  Administered 2019-10-06: 2 [IU] via SUBCUTANEOUS

## 2019-10-06 MED ORDER — ROCURONIUM BROMIDE 10 MG/ML (PF) SYRINGE
PREFILLED_SYRINGE | INTRAVENOUS | Status: AC
  Filled 2019-10-06: qty 30

## 2019-10-06 MED ORDER — INSULIN ASPART 100 UNIT/ML ~~LOC~~ SOLN
5.0000 [IU] | Freq: Once | SUBCUTANEOUS | Status: AC
  Administered 2019-10-06: 5 [IU] via SUBCUTANEOUS

## 2019-10-06 MED ORDER — 0.9 % SODIUM CHLORIDE (POUR BTL) OPTIME
TOPICAL | Status: DC | PRN
  Administered 2019-10-06: 15:00:00 1000 mL

## 2019-10-06 MED ORDER — KETOROLAC TROMETHAMINE 30 MG/ML IJ SOLN
INTRAMUSCULAR | Status: AC
  Filled 2019-10-06: qty 1

## 2019-10-06 MED ORDER — GLYCOPYRROLATE PF 0.2 MG/ML IJ SOSY
PREFILLED_SYRINGE | INTRAMUSCULAR | Status: AC
  Filled 2019-10-06: qty 2

## 2019-10-06 MED ORDER — EPINEPHRINE PF 1 MG/ML IJ SOLN
INTRAMUSCULAR | Status: AC
  Filled 2019-10-06: qty 1

## 2019-10-06 MED ORDER — INSULIN ASPART 100 UNIT/ML ~~LOC~~ SOLN
7.0000 [IU] | Freq: Once | SUBCUTANEOUS | Status: AC
  Administered 2019-10-06: 7 [IU] via SUBCUTANEOUS

## 2019-10-06 MED ORDER — CEFAZOLIN SODIUM-DEXTROSE 2-3 GM-%(50ML) IV SOLR
INTRAVENOUS | Status: DC | PRN
  Administered 2019-10-06: 2 g via INTRAVENOUS

## 2019-10-06 MED ORDER — PROPOFOL 10 MG/ML IV BOLUS
INTRAVENOUS | Status: AC
  Filled 2019-10-06: qty 40

## 2019-10-06 MED ORDER — INSULIN DETEMIR 100 UNIT/ML ~~LOC~~ SOLN
25.0000 [IU] | Freq: Once | SUBCUTANEOUS | Status: AC
  Administered 2019-10-06: 25 [IU] via SUBCUTANEOUS
  Filled 2019-10-06: qty 0.25

## 2019-10-06 MED ORDER — INSULIN ASPART 100 UNIT/ML ~~LOC~~ SOLN
7.0000 [IU] | Freq: Three times a day (TID) | SUBCUTANEOUS | Status: DC
  Administered 2019-10-07 – 2019-10-08 (×5): 7 [IU] via SUBCUTANEOUS

## 2019-10-06 MED ORDER — HYDROMORPHONE HCL 1 MG/ML IJ SOLN
INTRAMUSCULAR | Status: AC
  Filled 2019-10-06: qty 1

## 2019-10-06 MED ORDER — ONDANSETRON HCL 4 MG/2ML IJ SOLN: 4.0000 mg | Freq: Once | INTRAMUSCULAR | Status: DC | PRN

## 2019-10-06 MED ORDER — CEFAZOLIN SODIUM-DEXTROSE 2-4 GM/100ML-% IV SOLN
2.0000 g | Freq: Three times a day (TID) | INTRAVENOUS | Status: DC
  Administered 2019-10-06 – 2019-10-07 (×3): 2 g via INTRAVENOUS
  Filled 2019-10-06 (×5): qty 100

## 2019-10-06 MED ORDER — VANCOMYCIN HCL 1000 MG IV SOLR
INTRAVENOUS | Status: AC
  Filled 2019-10-06: qty 1000

## 2019-10-06 MED ORDER — VANCOMYCIN HCL 1000 MG IV SOLR
INTRAVENOUS | Status: DC | PRN
  Administered 2019-10-06: 1000 mg

## 2019-10-06 MED ORDER — INSULIN ASPART 100 UNIT/ML ~~LOC~~ SOLN
15.0000 [IU] | Freq: Once | SUBCUTANEOUS | Status: AC
  Administered 2019-10-06: 08:00:00 15 [IU] via SUBCUTANEOUS

## 2019-10-06 MED ORDER — INSULIN ASPART 100 UNIT/ML ~~LOC~~ SOLN: 15.0000 [IU] | Freq: Once | SUBCUTANEOUS | Status: DC

## 2019-10-06 MED ORDER — FENTANYL CITRATE (PF) 250 MCG/5ML IJ SOLN
INTRAMUSCULAR | Status: AC
  Filled 2019-10-06: qty 5

## 2019-10-06 MED ORDER — SUCCINYLCHOLINE CHLORIDE 200 MG/10ML IV SOSY
PREFILLED_SYRINGE | INTRAVENOUS | Status: AC
  Filled 2019-10-06: qty 20

## 2019-10-06 MED ORDER — INSULIN ASPART 100 UNIT/ML ~~LOC~~ SOLN: 7.0000 [IU] | Freq: Once | SUBCUTANEOUS | Status: DC

## 2019-10-06 MED ORDER — INSULIN REGULAR(HUMAN) IN NACL 100-0.9 UT/100ML-% IV SOLN
INTRAVENOUS | Status: DC
  Administered 2019-10-06: 1.5 [IU]/h via INTRAVENOUS

## 2019-10-06 MED ORDER — SODIUM CHLORIDE 0.9 % IV SOLN
INTRAVENOUS | Status: DC
  Administered 2019-10-06: 14:00:00 via INTRAVENOUS

## 2019-10-06 MED ORDER — ACETAMINOPHEN 325 MG PO TABS
650.0000 mg | ORAL_TABLET | Freq: Four times a day (QID) | ORAL | Status: DC
  Administered 2019-10-06 – 2019-10-12 (×22): 650 mg via ORAL
  Filled 2019-10-06 (×21): qty 2

## 2019-10-06 MED ORDER — FENTANYL CITRATE (PF) 250 MCG/5ML IJ SOLN
INTRAMUSCULAR | Status: DC | PRN
  Administered 2019-10-06 (×2): 75 ug via INTRAVENOUS

## 2019-10-06 MED ORDER — KETOROLAC TROMETHAMINE 30 MG/ML IJ SOLN
30.0000 mg | Freq: Once | INTRAMUSCULAR | Status: AC | PRN
  Administered 2019-10-06: 30 mg via INTRAVENOUS

## 2019-10-06 MED ORDER — SUCCINYLCHOLINE CHLORIDE 200 MG/10ML IV SOSY
PREFILLED_SYRINGE | INTRAVENOUS | Status: DC | PRN
  Administered 2019-10-06: 120 mg via INTRAVENOUS

## 2019-10-06 MED ORDER — INSULIN ASPART 100 UNIT/ML ~~LOC~~ SOLN: 0.0000 [IU] | Freq: Three times a day (TID) | SUBCUTANEOUS | Status: DC

## 2019-10-06 MED ORDER — LIDOCAINE 2% (20 MG/ML) 5 ML SYRINGE
INTRAMUSCULAR | Status: AC
  Filled 2019-10-06: qty 20

## 2019-10-06 MED ORDER — DEXAMETHASONE SODIUM PHOSPHATE 10 MG/ML IJ SOLN
INTRAMUSCULAR | Status: AC
  Filled 2019-10-06: qty 2

## 2019-10-06 MED ORDER — SODIUM CHLORIDE 0.9 % IV SOLN
INTRAVENOUS | Status: DC
  Administered 2019-10-06 – 2019-10-10 (×2): via INTRAVENOUS

## 2019-10-06 MED ORDER — DEXTROSE 50 % IV SOLN: 0.0000 mL | INTRAVENOUS | Status: DC | PRN

## 2019-10-06 MED ORDER — LACTATED RINGERS IV SOLN: INTRAVENOUS | Status: DC

## 2019-10-06 SURGICAL SUPPLY — 77 items
BANDAGE ESMARK 6X9 LF (GAUZE/BANDAGES/DRESSINGS) ×1 IMPLANT
BNDG COHESIVE 4X5 TAN STRL (GAUZE/BANDAGES/DRESSINGS) ×3 IMPLANT
BNDG COHESIVE 6X5 TAN STRL LF (GAUZE/BANDAGES/DRESSINGS) ×3 IMPLANT
BNDG ELASTIC 4X5.8 VLCR STR LF (GAUZE/BANDAGES/DRESSINGS) ×3 IMPLANT
BNDG ELASTIC 6X5.8 VLCR STR LF (GAUZE/BANDAGES/DRESSINGS) ×3 IMPLANT
BNDG ESMARK 6X9 LF (GAUZE/BANDAGES/DRESSINGS) ×3
BNDG GAUZE ELAST 4 BULKY (GAUZE/BANDAGES/DRESSINGS) ×6 IMPLANT
BRUSH SCRUB EZ PLAIN DRY (MISCELLANEOUS) ×6 IMPLANT
CANISTER WOUND CARE 500ML ATS (WOUND CARE) ×4 IMPLANT
CHLORAPREP W/TINT 26 (MISCELLANEOUS) ×3 IMPLANT
CLOSURE WOUND 1/2 X4 (GAUZE/BANDAGES/DRESSINGS)
COVER MAYO STAND STRL (DRAPES) ×3 IMPLANT
COVER SURGICAL LIGHT HANDLE (MISCELLANEOUS) ×6 IMPLANT
COVER WAND RF STERILE (DRAPES) ×3 IMPLANT
CUFF TOURN SGL QUICK 18X4 (TOURNIQUET CUFF) IMPLANT
CUFF TOURN SGL QUICK 24 (TOURNIQUET CUFF)
CUFF TOURN SGL QUICK 34 (TOURNIQUET CUFF)
CUFF TRNQT CYL 24X4X16.5-23 (TOURNIQUET CUFF) IMPLANT
CUFF TRNQT CYL 34X4.125X (TOURNIQUET CUFF) IMPLANT
DRAPE C-ARM 42X72 X-RAY (DRAPES) IMPLANT
DRAPE C-ARMOR (DRAPES) ×3 IMPLANT
DRAPE ORTHO SPLIT 77X108 STRL (DRAPES) ×2
DRAPE SURG 17X23 STRL (DRAPES) ×3 IMPLANT
DRAPE SURG ORHT 6 SPLT 77X108 (DRAPES) ×1 IMPLANT
DRAPE U-SHAPE 47X51 STRL (DRAPES) ×3 IMPLANT
DRSG ADAPTIC 3X8 NADH LF (GAUZE/BANDAGES/DRESSINGS) ×3 IMPLANT
DRSG VAC ATS SM SENSATRAC (GAUZE/BANDAGES/DRESSINGS) ×2 IMPLANT
ELECT REM PT RETURN 9FT ADLT (ELECTROSURGICAL) ×3
ELECTRODE REM PT RTRN 9FT ADLT (ELECTROSURGICAL) ×1 IMPLANT
EVACUATOR 1/8 PVC DRAIN (DRAIN) IMPLANT
GAUZE SPONGE 4X4 12PLY STRL (GAUZE/BANDAGES/DRESSINGS) ×3 IMPLANT
GLOVE BIO SURGEON STRL SZ 6.5 (GLOVE) ×6 IMPLANT
GLOVE BIO SURGEON STRL SZ7.5 (GLOVE) ×12 IMPLANT
GLOVE BIO SURGEONS STRL SZ 6.5 (GLOVE) ×3
GLOVE BIOGEL PI IND STRL 6.5 (GLOVE) ×1 IMPLANT
GLOVE BIOGEL PI IND STRL 7.5 (GLOVE) ×1 IMPLANT
GLOVE BIOGEL PI INDICATOR 6.5 (GLOVE) ×2
GLOVE BIOGEL PI INDICATOR 7.5 (GLOVE) ×2
GOWN STRL REUS W/ TWL LRG LVL3 (GOWN DISPOSABLE) ×2 IMPLANT
GOWN STRL REUS W/TWL LRG LVL3 (GOWN DISPOSABLE) ×4
HANDPIECE INTERPULSE COAX TIP (DISPOSABLE)
KIT BASIN OR (CUSTOM PROCEDURE TRAY) ×3 IMPLANT
KIT TURNOVER KIT B (KITS) ×3 IMPLANT
MANIFOLD NEPTUNE II (INSTRUMENTS) ×3 IMPLANT
NEEDLE 22X1 1/2 (OR ONLY) (NEEDLE) IMPLANT
NS IRRIG 1000ML POUR BTL (IV SOLUTION) ×3 IMPLANT
PACK ORTHO EXTREMITY (CUSTOM PROCEDURE TRAY) ×3 IMPLANT
PAD ARMBOARD 7.5X6 YLW CONV (MISCELLANEOUS) ×6 IMPLANT
PAD CAST 4YDX4 CTTN HI CHSV (CAST SUPPLIES) IMPLANT
PADDING CAST COTTON 4X4 STRL (CAST SUPPLIES) ×2
PADDING CAST COTTON 6X4 STRL (CAST SUPPLIES) ×9 IMPLANT
SET HNDPC FAN SPRY TIP SCT (DISPOSABLE) IMPLANT
SPONGE LAP 18X18 RF (DISPOSABLE) ×3 IMPLANT
STAPLER VISISTAT 35W (STAPLE) IMPLANT
STOCKINETTE IMPERVIOUS LG (DRAPES) ×3 IMPLANT
STRIP CLOSURE SKIN 1/2X4 (GAUZE/BANDAGES/DRESSINGS) IMPLANT
SUCTION FRAZIER HANDLE 10FR (MISCELLANEOUS)
SUCTION TUBE FRAZIER 10FR DISP (MISCELLANEOUS) IMPLANT
SUT ETHILON 2 0 FS 18 (SUTURE) ×6 IMPLANT
SUT ETHILON 3 0 PS 1 (SUTURE) ×6 IMPLANT
SUT MNCRL AB 3-0 PS2 18 (SUTURE) ×3 IMPLANT
SUT MON AB 2-0 CT1 36 (SUTURE) ×3 IMPLANT
SUT PDS AB 0 CT 36 (SUTURE) IMPLANT
SUT PDS AB 2-0 CT1 27 (SUTURE) IMPLANT
SUT VIC AB 0 CT1 27 (SUTURE)
SUT VIC AB 0 CT1 27XBRD ANBCTR (SUTURE) IMPLANT
SUT VIC AB 2-0 CT1 27 (SUTURE)
SUT VIC AB 2-0 CT1 TAPERPNT 27 (SUTURE) IMPLANT
SWAB CULTURE ESWAB REG 1ML (MISCELLANEOUS) IMPLANT
SYR CONTROL 10ML LL (SYRINGE) IMPLANT
TOWEL GREEN STERILE (TOWEL DISPOSABLE) ×6 IMPLANT
TOWEL GREEN STERILE FF (TOWEL DISPOSABLE) ×6 IMPLANT
TUBE CONNECTING 12'X1/4 (SUCTIONS) ×1
TUBE CONNECTING 12X1/4 (SUCTIONS) ×2 IMPLANT
UNDERPAD 30X30 (UNDERPADS AND DIAPERS) ×3 IMPLANT
WATER STERILE IRR 1000ML POUR (IV SOLUTION) ×6 IMPLANT
YANKAUER SUCT BULB TIP NO VENT (SUCTIONS) ×3 IMPLANT

## 2019-10-06 NOTE — Progress Notes (Signed)
Pharmacy Antibiotic Note  Deanna Landry is a 48 y.o. female admitted on 10/03/2019 with right ankle infection and has been on vancomycin.  Pharmacy now consulted for Ancef dosing.  Ortho plans for I&D today 10/06/19.  Patient has baseline CKD and renal function is stable.  Afebrile, WBC WNL.  Plan: Continue vanc 1gm IV Q48H for AUC 439 using SCr 2.07-2.14 Start Ancef 2gm IV Q8H Monitor renal fxn, clinical progress, vanc AUC as indicated F/U micro to de-escalate abx, minimize abx d/t risk for C.diff  Height: 5\' 2"  (157.5 cm) Weight: 181 lb 7 oz (82.3 kg) IBW/kg (Calculated) : 50.1  Temp (24hrs), Avg:98.5 F (36.9 C), Min:97.7 F (36.5 C), Max:98.9 F (37.2 C)  Recent Labs  Lab 10/03/19 2232 10/04/19 1646 10/05/19 0531 10/05/19 0807 10/05/19 1214 10/05/19 1614 10/05/19 2024 10/06/19 0304 10/06/19 0705  WBC 10.0 7.3 7.2  --   --   --   --   --  5.2  CREATININE 2.14* 2.68* 2.95* 2.58* 2.34* 2.30* 2.08* 2.07*  --   LATICACIDVEN 1.1  --   --   --   --   --   --   --   --     Estimated Creatinine Clearance: 33.4 mL/min (A) (by C-G formula based on SCr of 2.07 mg/dL (H)).    No Known Allergies  Vanc 11/18 >> Ancef 11/20 >> Zosyn 11/18 x1  11/18 COVID - negative 11/18 MRSA PCR - negative 11/18 ankle wound - Staph aureus (preliminary) 11/20 surgical screen - negative  Sya Nestler D. Mina Marble, PharmD, BCPS, [place] 10/06/2019, 10:18 AM

## 2019-10-06 NOTE — Progress Notes (Signed)
PT Cancellation Note  Patient Details Name: Deanna Landry MRN: [id number] DOB: [birth year]   Cancelled Treatment:    Reason Eval/Treat Not Completed: Other (comment) Pt going to OR today and will follow up post op.   Marguarite Arbour A Jannelly Bergren 10/06/2019, 9:11 AM Marisa Severin, PT, DPT Acute Rehabilitation Services Pager [phone] Office [phone]

## 2019-10-06 NOTE — Anesthesia Procedure Notes (Signed)
Procedure Name: Intubation Date/Time: 10/06/2019 2:17 PM Performed by: Cleda Daub, CRNA Pre-anesthesia Checklist: Patient identified, Emergency Drugs available, Suction available and Patient being monitored Patient Re-evaluated:Patient Re-evaluated prior to induction Oxygen Delivery Method: Circle system utilized Preoxygenation: Pre-oxygenation with 100% oxygen Induction Type: IV induction and Rapid sequence Laryngoscope Size: Miller and 2 Grade View: Grade I Tube type: Oral Tube size: 7.0 mm Number of attempts: 1 Airway Equipment and Method: Stylet Placement Confirmation: ETT inserted through vocal cords under direct vision,  positive ETCO2 and breath sounds checked- equal and bilateral Secured at: 20 cm Tube secured with: Tape Dental Injury: Teeth and Oropharynx as per pre-operative assessment

## 2019-10-06 NOTE — Progress Notes (Signed)
Subjective: HD#2. Deanna Landry was evaluated at bedside this morning. She reports feeling well this morning and does not have any acute concerns. She is scheduled for I&D in the afternoon with orthopedics.    Objective:  Vital signs in last 24 hours: Vitals:   10/05/19 2005 10/05/19 2341 10/06/19 0451 10/06/19 0814  BP:  (!) 128/53 137/63 (!) 148/62  Pulse:  82 78 82  Resp:  20 (!) 22 15  Temp:  98.7 F (37.1 C) 98.9 F (37.2 C) 98.9 F (37.2 C)  TempSrc:  Oral Oral Oral  SpO2:  96% 95% 97%  Weight: 82.3 kg     Height: 5\' 2"  (1.575 m)      Physical Exam Vitals signs and nursing note reviewed.  Constitutional:      General: She is not in acute distress.    Appearance: Normal appearance. She is not ill-appearing or diaphoretic.  Cardiovascular:     Rate and Rhythm: Normal rate and regular rhythm.     Pulses: Normal pulses.     Heart sounds: Normal heart sounds. No murmur. No friction rub. No gallop.   Abdominal:     General: Bowel sounds are normal. There is no distension.     Palpations: Abdomen is soft.     Tenderness: There is no abdominal tenderness. There is no guarding or rebound.  Musculoskeletal: Normal range of motion.     Comments: Right lower extremity wrapped   Skin:    General: Skin is warm and dry.     Capillary Refill: Capillary refill takes less than 2 seconds.  Neurological:     Mental Status: She is alert and oriented to person, place, and time.     Assessment/Plan:  Deanna Landry is a 48 year old female with history of CHF, DMII, HTN, CKDIII, prior CVA with right lower extremity weakness, CAD, COPD and legal blindness s/p ORIF of right ankle in September presenting with a right ankle cellulitis for one week duration.   Right ankle cellulitis with Staph aureus s/p recent ORIF: Patient presented with right ankle cellulitis for one week duration. Her right lower extremity is currently in wrapping. She reports improvement in pain. Per orthopedics, plan is for  I&D with possible wound vac placement. Wound culture thusfar with moderate amount of staph aureus. She is on IV vancomycin and Ancef for coverage, pending sensitivities.  - I&D today with Dr. Doreatha Martin - Continue IV vancomycin and ancef per pharmacy dosing - Dilaudid 0.5mg  q6h prn - f/u sensitivities for abx narrowing - CBC daily  - PT/OT evaluation post-op   Hyperglycemia in setting of Type I DM, early DKA: Patient with early DKA yesterday placed on insulin gtt via Endotool. Her anion gap closed around 1600 and patient was subsequently transitioned to subQ insulin. This morning, her CBG >400 for which she was given total of 27U novolog with improvement in CBG to 250. Her right ankle cellulitis is likely contributing to her hyperglycemia and expect improvement in her CBG's once infection cleared. However, will continue with Endotool for now and readjust insulin dosage after surgery today.  - Insulin management with endotool - Maintenance fluids: LR at 75 cc/hr - Transition to levemir 30U bid, SSI resistant tid + qHS - CBG q1h   Gastroenteritis: Patient presented with nausea, vomiting, and diarrhea with decreased PO intake for 4 days duration. She reports improvement in nausea and has not had any episodes of vomiting or diarrhea since admission. Will continue to monitor for now. If  she develops diarrhea, will get C.diff testing given recent antibiotic use.  - Zofran ODT q8h prn  - Continue to monitor   CKDIII: BUN/Cr 37/2.04 this AM. Patient baseline sCr 2.34.  - Continue to monitor - Avoid nephrotoxic agents - BMP daily   Hx of CVA with RLE weakness: Patient with right lower extremity weakness secondary to prior CVA.  - Continue atorvastatin 80mg  qd - Continue aspirin 81mg  qd and plavix 75mg  qd   Dispo: Anticipated discharge pending clinical improvement.   Harvie Heck, MD  Internal Medicine, PGY-1 10/06/2019, 10:39 AM Pager: [phone]

## 2019-10-06 NOTE — Op Note (Signed)
Orthopaedic Surgery Operative Note (CSN: [id number] ) Date of Surgery: 10/06/2019  Admit Date: 10/03/2019   Diagnoses: Pre-Op Diagnoses: Right ankle postoperative infection  Post-Op Diagnosis: Same  Procedures: 1. CPT 10180-Irrigation and debridement of right ankle postoperative wound infection 2. CPT 20680-Removal of hardware right ankle 3. CPT 97605-Wound vac placement  Surgeons : Primary: Shona Needles, MD  Assistant: Patrecia Pace, PA-C  Location: OR 4   Anesthesia:General  Antibiotics: Ancef 2g preop, 1 gram Vancomycin locally  Tourniquet time:None  Estimated Blood [id number] mL  Complications:None   Specimens: ID Type Source Tests Collected by Time Destination  A : abscess right lateral ankle Abscess Abscess AEROBIC/ANAEROBIC CULTURE (SURGICAL/DEEP WOUND) Shona Needles, MD 10/06/2019 1435      Implants: * No implants in log *   Indications for Surgery: 48 year old female who underwent open reduction internal fixation of right trimalleolar ankle fracture with myself in September of this year.  Her postoperative course was uncomplicated until about a week ago when she developed swelling and pain.  About 3 days prior to admission she developed significant drainage from the wound which was purulent.  She felt nauseous and sick and was presented to the emergency room.  She had a clear postoperative infection with a severe hyperglycemia.  An attempt was made to take her yesterday for irrigation debridement unfortunately her sugars and electrolytes were so abnormal that was felt to delay another day to make general anesthetic safer.  I recommended proceeding to the operating room for irrigation debridement with possible hardware removal with a likely wound VAC placement.  Risks and benefits were discussed with the patient including risk of bleeding, continued infection, nerve and blood vessel injury, need for further surgery, ankle stiffness, even the possibility amputation.   Patient agreed proceed with surgery and consent was obtained  Operative Findings: 1.  Postoperative wound infection with significant purulence that was encountered around the fibula and hardware of the lateral ankle 2.  No significant signs of osteomyelitis and it appeared that her fracture was nearly healed so hardware was removed from the lateral malleolus. 3.  Wound VAC placement to right lower extremity.  Procedure: The patient was identified in the preoperative holding area. Consent was confirmed with the patient and their family and all questions were answered. The operative extremity was marked after confirmation with the patient. she was then brought back to the operating room by our anesthesia colleagues.  She was carefully transferred over to a radiolucent flat top table.  She was placed under general anesthetic.  The right lower extremity was then prepped and draped in usual sterile fashion.  Timeout was performed to verify the patient procedure the extremity.  Preoperative antibiotics were dosed.  The lateral wound had a eschar that was in place.  At the distal portion there was active draining purulence.  I excised the eschar and excised the unhealthy skin back to healthy appearing soft tissue.  I extended the incision proximally and distally.  I was able to encounter some purulence in the proximal portion of the wound.  There was fibrinous and exudative material that was encountered.  I debrided this using a Cobb elevator as well as a ronguer.  I sent some of the purulent fluid for culture.  I obtained a fluoroscopic imaging that showed that the fracture may be healed.  I proceeded to remove the hardware as I felt that closure would be unattainable without removal of the hardware.  This was successfully done.  There was some  of the screws that were loose but there was no signs of osteomyelitis.  The fracture was palpated there was no gross motion at the fracture I did not stress it too much  as I felt that what healing was in place was tenuous.  I then debrided the wound bed once more.  I irrigated with 3 L normal saline.  I then placed a gram of vancomycin powder into the wound.  I placed a wound VAC and connected to 125 mmHg.  A sterile dressing consisting of cast padding and Ace wrap was placed.  The patient was then awoken from anesthesia and taken to PACU in stable condition.  Post Op Plan/Instructions: Patient will be nonweightbearing to the right lower extremity.  Continue wound VAC until return to the operating room on Monday for repeat irrigation debridement with wound VAC change.  Continue broad-spectrum antibiotics until cultures return.  Continue with the DVT prophylaxis per the primary team.  I was present and performed the entire surgery.  Patrecia Pace, PA-C did assist me throughout the case. An assistant was necessary given the difficulty in approach and removal of hardware.   Katha Hamming, MD [facility]

## 2019-10-06 NOTE — Progress Notes (Signed)
Upon reviewing chart for patient's upcoming surgery today, found CBG 448. Notified anesthesia and surgeon. Surgeon stated to restart Endotool. Called diabetic coordinator to facilitate. Diabetic coordinator to review chart, speak with MD, and coordinate glucose management.

## 2019-10-06 NOTE — Progress Notes (Signed)
Ortho Trauma Progress Note  Plan for I&D +/- hardware removal. Will likely vac and return to OR Monday. Will assess further intraoperatively. Risks and benefits discussed with Ms. Ploski and she agrees to proceed with surgery. Consent obtained.  Shona Needles, MD [facility] [phone] (office) [email or web address]

## 2019-10-06 NOTE — Progress Notes (Signed)
OT Cancellation Note  Patient Details Name: Deanna Landry MRN: [id number] DOB: [birth year]   Cancelled Treatment:     cx treatment patient is going to sx today  [place] 10/06/2019, 9:20 AM

## 2019-10-06 NOTE — Transfer of Care (Signed)
Immediate Anesthesia Transfer of Care Note  Patient: Deanna Landry  Procedure(s) Performed: IRRIGATION AND DEBRIDEMENT EXTREMITY (Right Leg Lower) HARDWARE REMOVAL (Right Leg Lower)  Patient Location: PACU  Anesthesia Type:General  Level of Consciousness: awake, alert , oriented and patient cooperative  Airway & Oxygen Therapy: Patient Spontanous Breathing and Patient connected to face mask oxygen  Post-op Assessment: Report given to RN and Post -op Vital signs reviewed and stable  Post vital signs: Reviewed and stable  Last Vitals:  Vitals Value Taken Time  BP 158/61 10/06/19 1504  Temp 36.1 C 10/06/19 1505  Pulse 93 10/06/19 1513  Resp 18 10/06/19 1513  SpO2 97 % 10/06/19 1513  Vitals shown include unvalidated device data.  Last Pain:  Vitals:   10/06/19 0814  TempSrc: Oral  PainSc: 0-No pain      Patients Stated Pain Goal: 4 ([id number])  Complications: No apparent anesthesia complications

## 2019-10-06 NOTE — Progress Notes (Signed)
Internal Medicine Attending Note:   I have seen and evaluated this patient and I have discussed the plan of care with the house staff. Please see their note for complete details. I concur with their findings.  Patient has ongoing hyperglycemia which has been difficult to control in the setting of her infection. Restarted on insulin gtt this morning. Will continue to monitor closely post operatively. I am hopeful hyperglycemia will improve after source control.   Appreciate ortho assistance. She is currently in the OR for irrigation and debridement of her right ankle wound. Continue broad spectrum antibiotics for now. Will follow up surgical cultures if obtained.   Rest per resident note.   Velna Ochs, MD 10/06/2019, 12:38 PM

## 2019-10-06 NOTE — Care Management Important Message (Signed)
Important Message  Patient Details  Name: Deanna Landry MRN: [id number] Date of Birth: [birth year]   Medicare Important Message Given:  Yes     Shelda Altes 10/06/2019, 1:48 PM

## 2019-10-06 NOTE — Progress Notes (Signed)
Alfonse Flavors, diabetic coordinator, returned call and after reviewing chart recommended patient go back on the Endotool. Surgeon made aware of recommendation.

## 2019-10-06 NOTE — Progress Notes (Signed)
Orthopedic Tech Progress Note Patient Details:  Deanna Landry August 08, 1971 [id number] Nurse will contact doctor to make sure the order is for the velcro ankle orthosis and not the outside vendor afo.  Patient ID: Deanna Landry, female   DOB: [birth year], 48 y.o.   MRN: [id number]   Staci Righter 10/06/2019, 4:21 PM

## 2019-10-06 NOTE — Progress Notes (Signed)
Inpatient Diabetes Program Recommendations  AACE/ADA: New Consensus Statement on Inpatient Glycemic Control (2015)  Target Ranges:  Prepandial:   less than 140 mg/dL      Peak postprandial:   less than 180 mg/dL (1-2 hours)      Critically ill patients:  140 - 180 mg/dL   Lab Results  Component Value Date   GLUCAP 396 (H) 10/06/2019   HGBA1C 7.7 (H) 10/05/2019    Review of Glycemic Control  Inpatient Diabetes Program Recommendations:   Reviewed CBGs. Patient only received 2 units @ end of IV insulin with CBG 245.   Recommending restart Endotool hyperglycemic crisis order hyperglycemia order set and list as type 1 DM.  When patient is transitioning to SQ after IV insulin, recommend increasing basal insulin to Levemir 25 units bid (give first dose 2 hrs prior to D/C IV insulin and cover CBG with Novolog correction scale when IV insulin discontinued).  Paged Dr. Marva Panda and spoke to her reviewing CBGs and recommendations to restart IV insulin per endotool. Dr. Marva Panda stated she will discuss insulin orders with her team. Diabetes Coordinator available to assist as needed.  Spoke with RN Kathleen Argue and recommended recheck CBG in 1 hr. Post Novolog given.  Spoke with [employer] and received information that surgery is moved back to 1 pm.  Thank you, [name]. Flint Hakeem, RN, MSN, CDE  Diabetes Coordinator Inpatient Glycemic Control Team Team Pager [phone] (8am-5pm) 10/06/2019 8:55 AM

## 2019-10-07 DIAGNOSIS — E1165 Type 2 diabetes mellitus with hyperglycemia: Secondary | ICD-10-CM

## 2019-10-07 DIAGNOSIS — E1122 Type 2 diabetes mellitus with diabetic chronic kidney disease: Secondary | ICD-10-CM

## 2019-10-07 LAB — CBC
HCT: 24.4 % — ABNORMAL LOW (ref 36.0–46.0)
HCT: 23.6 % — ABNORMAL LOW (ref 36.0–46.0)
Hemoglobin: 7.7 g/dL — ABNORMAL LOW (ref 12.0–15.0)
Hemoglobin: 7.5 g/dL — ABNORMAL LOW (ref 12.0–15.0)
MCH: 27.8 pg (ref 26.0–34.0)
MCH: 27.8 pg (ref 26.0–34.0)
MCHC: 31.6 g/dL (ref 30.0–36.0)
MCHC: 31.8 g/dL (ref 30.0–36.0)
MCV: 88.1 fL (ref 80.0–100.0)
MCV: 87.4 fL (ref 80.0–100.0)
Platelets: 247 10*3/uL (ref 150–400)
Platelets: 246 10*3/uL (ref 150–400)
RBC: 2.77 MIL/uL — ABNORMAL LOW (ref 3.87–5.11)
RBC: 2.7 MIL/uL — ABNORMAL LOW (ref 3.87–5.11)
RDW: 14.1 % (ref 11.5–15.5)
RDW: 14.4 % (ref 11.5–15.5)
WBC: 5.9 10*3/uL (ref 4.0–10.5)
WBC: 4.3 10*3/uL (ref 4.0–10.5)
nRBC: 0 % (ref 0.0–0.2)
nRBC: 0 % (ref 0.0–0.2)

## 2019-10-07 LAB — BASIC METABOLIC PANEL
Anion gap: 9 (ref 5–15)
BUN: 28 mg/dL — ABNORMAL HIGH (ref 6–20)
CO2: 27 mmol/L (ref 22–32)
Calcium: 8.2 mg/dL — ABNORMAL LOW (ref 8.9–10.3)
Chloride: 101 mmol/L (ref 98–111)
Creatinine, Ser: 1.88 mg/dL — ABNORMAL HIGH (ref 0.44–1.00)
GFR calc Af Amer: 36 mL/min — ABNORMAL LOW (ref >60)
GFR calc non Af Amer: 31 mL/min — ABNORMAL LOW (ref >60)
Glucose, Bld: 137 mg/dL — ABNORMAL HIGH (ref 70–99)
Potassium: 4.1 mmol/L (ref 3.5–5.1)
Sodium: 137 mmol/L (ref 135–145)

## 2019-10-07 LAB — GLUCOSE, CAPILLARY
Glucose-Capillary: 130 mg/dL — ABNORMAL HIGH (ref 70–99)
Glucose-Capillary: 135 mg/dL — ABNORMAL HIGH (ref 70–99)
Glucose-Capillary: 142 mg/dL — ABNORMAL HIGH (ref 70–99)
Glucose-Capillary: 148 mg/dL — ABNORMAL HIGH (ref 70–99)
Glucose-Capillary: 150 mg/dL — ABNORMAL HIGH (ref 70–99)
Glucose-Capillary: 163 mg/dL — ABNORMAL HIGH (ref 70–99)
Glucose-Capillary: 199 mg/dL — ABNORMAL HIGH (ref 70–99)

## 2019-10-07 MED ORDER — INSULIN DETEMIR 100 UNIT/ML ~~LOC~~ SOLN
25.0000 [IU] | Freq: Once | SUBCUTANEOUS | Status: DC
  Filled 2019-10-07: qty 0.25

## 2019-10-07 MED ORDER — CEFAZOLIN SODIUM-DEXTROSE 2-4 GM/100ML-% IV SOLN
2.0000 g | Freq: Three times a day (TID) | INTRAVENOUS | Status: DC
  Administered 2019-10-07 – 2019-10-12 (×15): 2 g via INTRAVENOUS
  Filled 2019-10-07 (×19): qty 100

## 2019-10-07 MED ORDER — SODIUM CHLORIDE 0.9% IV SOLUTION: Freq: Once | INTRAVENOUS | Status: DC

## 2019-10-07 MED ORDER — INSULIN DETEMIR 100 UNIT/ML ~~LOC~~ SOLN
25.0000 [IU] | Freq: Two times a day (BID) | SUBCUTANEOUS | Status: DC
  Administered 2019-10-07 – 2019-10-12 (×10): 25 [IU] via SUBCUTANEOUS
  Filled 2019-10-07 (×12): qty 0.25

## 2019-10-07 MED ORDER — PNEUMOCOCCAL VAC POLYVALENT 25 MCG/0.5ML IJ INJ
0.5000 mL | INJECTION | INTRAMUSCULAR | Status: AC
  Administered 2019-10-11: 0.5 mL via INTRAMUSCULAR
  Filled 2019-10-07: qty 0.5

## 2019-10-07 NOTE — Evaluation (Signed)
Occupational Therapy Evaluation Patient Details Name: Deanna Landry MRN: [id number] DOB: [birth year] Today's Date: 10/07/2019    History of Present Illness 48 yo F with a pmhx of HTN, Type I DM, CKD IV, CVA, depression, anxiety, and legal blindness presenting with a right ankle wound after recent admission and surgical fixation of a closed right trimalleolar fracture. Pt underwent I&D and hardware removal on 11/20.   Clinical Impression   Pt PTA: living at home with mother and friend assists as needed. Pt appears to be performing ADL and mobility at baseline level as pt has been NWB for months and spend a month in SNF for recovery. Pt plans to have family and friends provide assist as needed. Pt minA overall for ADL; set-upA for grooming due to poor vision- legally blind at baseline. Pt able to hop from bed to recliner with minA  To minguardA overall with RW. Pt supervisionA for bed mobility and no other focal deficits noted. Pt confident with going home to resume HH therapies. Pt tolerating session well. Pt does not require continued OT skilled services. OT signing off. Please re-order if needed. Thank you.    Follow Up Recommendations  Home health OT;Supervision/Assistance - 24 hour    Equipment Recommendations  Wheelchair (measurements OT);Wheelchair cushion (measurements OT)    Recommendations for Other Services       Precautions / Restrictions Precautions Precautions: Fall Precaution Comments: blind Restrictions Weight Bearing Restrictions: Yes RLE Weight Bearing: Non weight bearing      Mobility Bed Mobility Overal bed mobility: Needs Assistance Bed Mobility: Supine to Sit     Supine to sit: Supervision;HOB elevated     General bed mobility comments: use of bedrail  Transfers Overall transfer level: Needs assistance Equipment used: Rolling walker (2 wheeled) Transfers: Sit to/from Stand Sit to Stand: Min guard;Min assist         General transfer comment: minA  overall for stability    Balance Overall balance assessment: Needs assistance Sitting-balance support: No upper extremity supported;Feet unsupported Sitting balance-Leahy Scale: Good     Standing balance support: Bilateral upper extremity supported Standing balance-Leahy Scale: Fair Standing balance comment: mingaurdA pt hopping a few steps to recliner                           ADL either performed or assessed with clinical judgement   ADL Overall ADL's : At baseline                                       General ADL Comments: pt appears to be performing ADL and mobility at baseline level as pt has been NWB for months and spend a month in SNF for recovery. Pt plans to have family and friends provide assist as needed.     Vision Baseline Vision/History: Legally blind Patient Visual Report: No change from baseline Vision Assessment?: Vision impaired- to be further tested in functional context Additional Comments: legally blind     Perception     Praxis      Pertinent Vitals/Pain Pain Assessment: 0-10 Pain Score: 4  Pain Location: low back Pain Descriptors / Indicators: Discomfort Pain Intervention(s): Limited activity within patient's tolerance     Hand Dominance Right   Extremity/Trunk Assessment Upper Extremity Assessment Upper Extremity Assessment: Generalized weakness   Lower Extremity Assessment Lower Extremity Assessment: Overall WFL for tasks assessed;RLE  deficits/detail RLE Deficits / Details: s/p ankle hardware removal   Cervical / Trunk Assessment Cervical / Trunk Assessment: Normal   Communication Communication Communication: No difficulties   Cognition Arousal/Alertness: Awake/alert Behavior During Therapy: WFL for tasks assessed/performed Overall Cognitive Status: Within Functional Limits for tasks assessed                                     General Comments  Pt on 2L O2 throughout session >90% O2     Exercises     Shoulder Instructions      Home Living Family/patient expects to be discharged to:: Private residence Living Arrangements: Parent Available Help at Discharge: Friend(s);Available 24 hours/day Type of Home: House Home Access: Ramped entrance     Home Layout: One level     Bathroom Shower/Tub: Occupational psychologist: Standard     Home Equipment: Cane - single point;Bedside commode;Shower seat;Walker - 2 wheels;Wheelchair - manual   Additional Comments: blind cane, home O2      Prior Functioning/Environment Level of Independence: Needs assistance  Gait / Transfers Assistance Needed: SPC for tranfsers or W/C squat or stand pivot ADL's / Homemaking Assistance Needed: Assist for donning boot for RLE; otherwise, Independent   Comments: Pt on 3 L O2 at home. Friend stays with pt when her mother leaves.        OT Problem List: Decreased activity tolerance      OT Treatment/Interventions:      OT Goals(Current goals can be found in the care plan section) Acute Rehab OT Goals Patient Stated Goal: To return home  OT Frequency:     Barriers to D/C:            Co-evaluation              AM-PAC OT "6 Clicks" Daily Activity     Outcome Measure Help from another person eating meals?: None Help from another person taking care of personal grooming?: A Little Help from another person toileting, which includes using toliet, bedpan, or urinal?: A Little Help from another person bathing (including washing, rinsing, drying)?: A Little Help from another person to put on and taking off regular upper body clothing?: None Help from another person to put on and taking off regular lower body clothing?: A Little 6 Click Score: 20   End of Session Equipment Utilized During Treatment: Gait belt;Rolling walker Nurse Communication: Mobility status  Activity Tolerance: Patient tolerated treatment well Patient left: in chair;with call bell/phone within  reach;with chair alarm set  OT Visit Diagnosis: Unsteadiness on feet (R26.81);Muscle weakness (generalized) (M62.81)                Time: 1040-1120 OT Time Calculation (min): 40 min Charges:  OT General Charges $OT Visit: 1 Visit OT Evaluation $OT Eval Moderate Complexity: 1 Mod OT Treatments $Self Care/Home Management : 8-22 mins  Ebony Hail Harold Hedge) Marsa Aris OTR/L Acute Rehabilitation Services Pager: [phone] Office: [place] 10/07/2019, 4:04 PM

## 2019-10-07 NOTE — Progress Notes (Signed)
    Subjective: Patient reports pain as mild.  Tolerating diet.  Verbalizes understanding of plan for repeat I&D on Monday.  Objective:   VITALS:   Vitals:   10/07/19 0019 10/07/19 0307 10/07/19 0814 10/07/19 1216  BP: (!) 112/58 (!) 120/54 (!) 141/68 (!) 116/58  Pulse: 70 80 76 91  Resp: 17 18 17 20   Temp: 97.8 F (36.6 C) 97.6 F (36.4 C) 97.8 F (36.6 C) 97.8 F (36.6 C)  TempSrc: Oral Oral Oral Oral  SpO2: 93% 98% 99% 100%  Weight:      Height:       CBC Latest Ref Rng & Units 10/07/2019 10/07/2019 10/06/2019  WBC 4.0 - 10.5 K/uL 4.3 5.9 5.2  Hemoglobin 12.0 - 15.0 g/dL 7.5(L) 7.7(L) 8.8(L)  Hematocrit 36.0 - 46.0 % 23.6(L) 24.4(L) 27.5(L)  Platelets 150 - 400 K/uL 246 247 217   BMP Latest Ref Rng & Units 10/07/2019 10/06/2019 10/05/2019  Glucose 70 - 99 mg/dL 137(H) 440(H) 263(H)  BUN 6 - 20 mg/dL 28(H) 37(H) 38(H)  Creatinine 0.44 - 1.00 mg/dL 1.88(H) 2.07(H) 2.08(H)  Sodium 135 - 145 mmol/L 137 130(L) 129(L)  Potassium 3.5 - 5.1 mmol/L 4.1 5.4(H) 4.6  Chloride 98 - 111 mmol/L 101 97(L) 97(L)  CO2 22 - 32 mmol/L 27 23 22   Calcium 8.9 - 10.3 mg/dL 8.2(L) 8.6(L) 8.2(L)   Intake/Output      11/20 0701 - 11/21 0700 11/21 0701 - 11/22 0700   I.V. (mL/kg) 1475.4 (17.9)    IV Piggyback 200    Total Intake(mL/kg) 1675.4 (20.4)    Urine (mL/kg/hr)     Blood 10    Total Output 10    Net +1665.4         Urine Occurrence  600 x      Physical Exam: General: NAD.  Upright in chair.  Calm, conversant. MSK RLE: Foot warm Dressings, ASO, and wound VAC in place.  Mild serosanguineous drainage in canister. Gross sensation intact distally.  EHL/FHL intact  Assessment: 1 Day Post-Op  S/P Procedure(s) (LRB): IRRIGATION AND DEBRIDEMENT EXTREMITY (Right) HARDWARE REMOVAL (Right) by Dr. Doreatha Martin on 10/06/2019  Principal Problem:   Wound infection after surgery (Right ankle) Active Problems:   Type 1 diabetes mellitus with ketoacidosis without coma (HCC)   Diabetic  polyneuropathy (HCC)   Hypertension   Right ankle postoperative infection, status post I/D, removal of hardware, wound VAC placement Doing well postop day 1 Tolerating diet and voiding Pain controlled   Plan: Dr. Doreatha Martin planning for repeat I&D and wound VAC change on Monday, 10/09/2019 Mobilize Incentive Spirometry Elevate Antibiotics changed to Ancef based on cultures - MSSA  Weightbearing: NWB RLE Insicional and dressing care: Maintain incisional VAC VTE prophylaxis: Per primary team (on Plavix, heparin), SCD, mobilize Pain control: Continue current regimen   Prudencio Burly III, PA-C  10/07/2019, 12:35 PM

## 2019-10-07 NOTE — Evaluation (Signed)
Physical Therapy Evaluation Patient Details Name: Deanna Landry MRN: [id number] DOB: 1970-11-20 Today's Date: 10/07/2019   History of Present Illness  48 yo F with a pmhx of HTN, Type I DM, CKD IV, CVA, depression, anxiety, and legal blindness presenting with a right ankle wound after recent admission and surgical fixation of a closed right trimalleolar fracture. Pt underwent I&D and hardware removal on 11/20.  Clinical Impression  Pt demonstrates deficits in functional mobility, gait, balance, endurance, power, although not far from her recent baseline. Pt is able to perform bed mobility and transfer with limited physical assistance at this time. Pt will benefit from continued acute PT services to improve dynamic standing balance and tolerance for OOB activity.    Follow Up Recommendations Home health PT;Supervision/Assistance - 24 hour    Equipment Recommendations  None recommended by PT    Recommendations for Other Services       Precautions / Restrictions Precautions Precautions: Fall Precaution Comments: blind Restrictions Weight Bearing Restrictions: Yes RLE Weight Bearing: Non weight bearing      Mobility  Bed Mobility Overal bed mobility: Needs Assistance Bed Mobility: Supine to Sit     Supine to sit: Supervision;HOB elevated     General bed mobility comments: use of bedrail  Transfers Overall transfer level: Needs assistance Equipment used: Rolling walker (2 wheeled) Transfers: Sit to/from Stand Sit to Stand: Min assist            Ambulation/Gait Ambulation/Gait assistance: Herbalist (Feet): 3 Feet Assistive device: Rolling walker (2 wheeled) Gait Pattern/deviations: (hop to pattern) Gait velocity: reduced Gait velocity interpretation: <1.31 ft/sec, indicative of household ambulator General Gait Details: pt taking short hops to turn from bedside to Physicist, medical    Modified Rankin (Stroke  Patients Only)       Balance Overall balance assessment: Needs assistance Sitting-balance support: No upper extremity supported;Feet unsupported Sitting balance-Leahy Scale: Good     Standing balance support: Bilateral upper extremity supported Standing balance-Leahy Scale: Fair Standing balance comment: minG                             Pertinent Vitals/Pain      Home Living Family/patient expects to be discharged to:: Private residence Living Arrangements: Parent Available Help at Discharge: Friend(s);Available 24 hours/day Type of Home: House Home Access: Ramped entrance     Home Layout: One level Home Equipment: Cane - single point;Bedside commode;Shower seat;Walker - 2 wheels;Wheelchair - manual Additional Comments: blind cane, home O2    Prior Function Level of Independence: Needs assistance   Gait / Transfers Assistance Needed: SPC for tranfsers or W/C squat or stand pivot  ADL's / Homemaking Assistance Needed: Assist for donning boot for RLE; otherwise, Independent  Comments: Pt on 3 L O2 at home. Friend stays with pt when her mother leaves.     Hand Dominance   Dominant Hand: Right    Extremity/Trunk Assessment   Upper Extremity Assessment Upper Extremity Assessment: Defer to OT evaluation    Lower Extremity Assessment Lower Extremity Assessment: Overall WFL for tasks assessed(for LLE, RLE ROM WFL, strength assessment deferred 2/2 NWB)    Cervical / Trunk Assessment Cervical / Trunk Assessment: Normal  Communication   Communication: No difficulties  Cognition Arousal/Alertness: Awake/alert Behavior During Therapy: WFL for tasks assessed/performed Overall Cognitive Status: Within Functional Limits for tasks assessed  General Comments General comments (skin integrity, edema, etc.): VSS, pt on 2L  throughout session    Exercises     Assessment/Plan    PT Assessment Patient  needs continued PT services  PT Problem List Decreased balance;Decreased mobility;Decreased knowledge of use of DME       PT Treatment Interventions DME instruction;Gait training;Functional mobility training;Therapeutic activities;Therapeutic exercise;Balance training;Neuromuscular re-education;Wheelchair mobility training;Patient/family education    PT Goals (Current goals can be found in the Care Plan section)  Acute Rehab PT Goals Patient Stated Goal: To return home PT Goal Formulation: With patient Time For Goal Achievement: 10/21/19 Potential to Achieve Goals: Good Additional Goals Additional Goal #1: Pt will mobilize in manual wheelchair for 100 feet with supervision    Frequency Min 3X/week   Barriers to discharge        Co-evaluation               AM-PAC PT "6 Clicks" Mobility  Outcome Measure Help needed turning from your back to your side while in a flat bed without using bedrails?: None Help needed moving from lying on your back to sitting on the side of a flat bed without using bedrails?: None Help needed moving to and from a bed to a chair (including a wheelchair)?: A Little Help needed standing up from a chair using your arms (e.g., wheelchair or bedside chair)?: A Little Help needed to walk in hospital room?: A Little Help needed climbing 3-5 steps with a railing? : A Lot 6 Click Score: 19    End of Session Equipment Utilized During Treatment: Gait belt;Oxygen Activity Tolerance: Patient tolerated treatment well Patient left: in chair;with call bell/phone within reach;with chair alarm set Nurse Communication: Mobility status PT Visit Diagnosis: Other abnormalities of gait and mobility (R26.89)    Time: [id number] PT Time Calculation (min) (ACUTE ONLY): 31 min   Charges:   PT Evaluation $PT Eval Low Complexity: [place], PT, DPT Acute Rehabilitation Pager: [phone]   Deanna Landry 10/07/2019, 12:20 PM

## 2019-10-07 NOTE — Progress Notes (Signed)
Subjective: HD#3. Ms. Cardelli was evaluated at bedside this morning. She reports feeling well this morning and does not have any acute concerns. Discussed that she is to continue on antibiotics and will return to OR on Monday. Patient reports understanding. All questions and concerns addressed.   Objective:  Vital signs in last 24 hours: Vitals:   10/06/19 2007 10/07/19 0019 10/07/19 0307 10/07/19 0814  BP: (!) 118/59 (!) 112/58 (!) 120/54 (!) 141/68  Pulse: 75 70 80 76  Resp: 16 17 18 17   Temp: 97.8 F (36.6 C) 97.8 F (36.6 C) 97.6 F (36.4 C) 97.8 F (36.6 C)  TempSrc: Oral Oral Oral Oral  SpO2: 99% 93% 98% 99%  Weight:      Height:       Physical Exam Vitals signs and nursing note reviewed.  Constitutional:      General: She is not in acute distress.    Appearance: Normal appearance. She is not ill-appearing or diaphoretic.  Cardiovascular:     Rate and Rhythm: Normal rate and regular rhythm.     Pulses: Normal pulses.     Heart sounds: Normal heart sounds. No murmur. No friction rub. No gallop.   Abdominal:     General: Bowel sounds are normal. There is no distension.     Palpations: Abdomen is soft.     Tenderness: There is no abdominal tenderness. There is no guarding or rebound.  Musculoskeletal: Normal range of motion.     Comments: Right lower extremity wrapped   Skin:    General: Skin is warm and dry.     Capillary Refill: Capillary refill takes less than 2 seconds.  Neurological:     Mental Status: She is alert and oriented to person, place, and time.     Assessment/Plan:  Ms. Melland is a 48 year old female with history of CHF, DMII, HTN, CKDIII, prior CVA with right lower extremity weakness, CAD, COPD and legal blindness s/p ORIF of right ankle in September presenting with a right ankle cellulitis for one week duration s/p I&D with wound vac placement.   Right ankle cellulitis s/p I&D: Patient presented with right ankle cellulitis for one week duration. She  underwent I&D yesterday with removal of hardware and wound vac placement. She reports her pain is currently well controlled. Superficial wound cultures with staph aureus for which patient is on vancomycin. She was on Ancef but to include gram negative coverage, will switch to Rocephin. Intraoperative cultures collected and so far with moderate gram positive cocci. Will continue to follow. Her right lower extremity is in ankle-foot orthosis. Sensation and motor intact in digits.  - Repeat I&D with wound vac change on Monday with Dr. Doreatha Martin - f/u intraoperative cultures  - Continue IV vancomycin Day #, Rocephin  - Dilaudid 0.5mg  q6h prn - CBC daily  - PT/OT evaluation  Hyperglycemia in setting of Type I DM/Type II DM: Patient on insulin gtt via [employer] for surgery. She was subsequently transitioned to subQ insulin with levemir 25U bid with resistant SSI tid, novolog 7U tid and night time coverage. Her CBG has been <200.  - Continue levemir 25U bid + Novolog 7U tid + SSI + qHS coverage  - Maintenance fluids: LR at 75 cc/hr - CBG monitoring  Gastroenteritis - resolved: Patient presented with nausea, vomiting, and diarrhea with decreased PO intake for 4 days duration. She reports improvement in nausea and has not had any episodes of vomiting or diarrhea since admission. Will continue to  monitor for now. If she develops diarrhea, will get C.diff testing given recent antibiotic use.  - Zofran ODT q8h prn  - Continue to monitor   CKDIII: BUN/Cr 28/1.88 this AM. Patient baseline sCr 2.34.  - Continue to monitor - Avoid nephrotoxic agents - BMP daily   Hx of CVA with RLE weakness: Patient with right lower extremity weakness secondary to prior CVA.  - Continue atorvastatin 80mg  qd - Continue aspirin 81mg  qd and plavix 75mg  qd   Dispo: Anticipated discharge pending clinical improvement.   Harvie Heck, MD  Internal Medicine, PGY-1 10/07/2019, 10:49 AM Pager: [phone]

## 2019-10-07 NOTE — Anesthesia Postprocedure Evaluation (Signed)
Anesthesia Post Note  Patient: Deanna Landry  Procedure(s) Performed: IRRIGATION AND DEBRIDEMENT EXTREMITY (Right Leg Lower) HARDWARE REMOVAL (Right Leg Lower)     Patient location during evaluation: PACU Anesthesia Type: General Level of consciousness: awake and alert Pain management: pain level controlled Vital Signs Assessment: post-procedure vital signs reviewed and stable Respiratory status: spontaneous breathing, nonlabored ventilation, respiratory function stable and patient connected to nasal cannula oxygen Cardiovascular status: blood pressure returned to baseline and stable Postop Assessment: no apparent nausea or vomiting Anesthetic complications: no    Last Vitals:  Vitals:   10/07/19 1545 10/07/19 1556  BP: 117/64 117/64  Pulse: 92 88  Resp: (!) 23 (!) 21  Temp: 36.8 C 36.7 C  SpO2: 100% 100%    Last Pain:  Vitals:   10/07/19 1556  TempSrc: Oral  PainSc:                  Brinson Tozzi COKER

## 2019-10-07 NOTE — Progress Notes (Signed)
Orthopedic Tech Progress Note Patient Details:  Deanna Landry [birth year] [id number] RN said patient has on an AFO Patient ID: [name], female   DOB: 01/23/1971, 48 y.o.   MRN: [id number]   [name] 10/07/2019, 7:57 AM

## 2019-10-08 ENCOUNTER — Encounter (HOSPITAL_COMMUNITY): Payer: Self-pay | Admitting: Student

## 2019-10-08 LAB — BPAM RBC
Blood Product Expiration Date: 202012222359
ISSUE DATE / TIME: 202011211516
Unit Type and Rh: 5100

## 2019-10-08 LAB — GLUCOSE, CAPILLARY
Glucose-Capillary: 100 mg/dL — ABNORMAL HIGH (ref 70–99)
Glucose-Capillary: 69 mg/dL — ABNORMAL LOW (ref 70–99)
Glucose-Capillary: 85 mg/dL (ref 70–99)
Glucose-Capillary: 110 mg/dL — ABNORMAL HIGH (ref 70–99)
Glucose-Capillary: 84 mg/dL (ref 70–99)

## 2019-10-08 LAB — CBC
HCT: 26.2 % — ABNORMAL LOW (ref 36.0–46.0)
HCT: 27.1 % — ABNORMAL LOW (ref 36.0–46.0)
Hemoglobin: 8.3 g/dL — ABNORMAL LOW (ref 12.0–15.0)
Hemoglobin: 8.5 g/dL — ABNORMAL LOW (ref 12.0–15.0)
MCH: 27.4 pg (ref 26.0–34.0)
MCH: 27.5 pg (ref 26.0–34.0)
MCHC: 31.4 g/dL (ref 30.0–36.0)
MCHC: 31.7 g/dL (ref 30.0–36.0)
MCV: 86.8 fL (ref 80.0–100.0)
MCV: 87.4 fL (ref 80.0–100.0)
Platelets: 256 10*3/uL (ref 150–400)
Platelets: 263 10*3/uL (ref 150–400)
RBC: 3.02 MIL/uL — ABNORMAL LOW (ref 3.87–5.11)
RBC: 3.1 MIL/uL — ABNORMAL LOW (ref 3.87–5.11)
RDW: 14.6 % (ref 11.5–15.5)
RDW: 14.7 % (ref 11.5–15.5)
WBC: 3.6 10*3/uL — ABNORMAL LOW (ref 4.0–10.5)
WBC: 4.6 10*3/uL (ref 4.0–10.5)
nRBC: 0 % (ref 0.0–0.2)
nRBC: 0 % (ref 0.0–0.2)

## 2019-10-08 LAB — BASIC METABOLIC PANEL
Anion gap: 6 (ref 5–15)
Anion gap: 9 (ref 5–15)
BUN: 21 mg/dL — ABNORMAL HIGH (ref 6–20)
BUN: 29 mg/dL — ABNORMAL HIGH (ref 6–20)
CO2: 26 mmol/L (ref 22–32)
CO2: 28 mmol/L (ref 22–32)
Calcium: 8.2 mg/dL — ABNORMAL LOW (ref 8.9–10.3)
Calcium: 8.3 mg/dL — ABNORMAL LOW (ref 8.9–10.3)
Chloride: 103 mmol/L (ref 98–111)
Chloride: 107 mmol/L (ref 98–111)
Creatinine, Ser: 1.75 mg/dL — ABNORMAL HIGH (ref 0.44–1.00)
Creatinine, Ser: 1.91 mg/dL — ABNORMAL HIGH (ref 0.44–1.00)
GFR calc Af Amer: 36 mL/min — ABNORMAL LOW (ref >60)
GFR calc Af Amer: 39 mL/min — ABNORMAL LOW (ref >60)
GFR calc non Af Amer: 31 mL/min — ABNORMAL LOW (ref >60)
GFR calc non Af Amer: 34 mL/min — ABNORMAL LOW (ref >60)
Glucose, Bld: 121 mg/dL — ABNORMAL HIGH (ref 70–99)
Glucose, Bld: 184 mg/dL — ABNORMAL HIGH (ref 70–99)
Potassium: 4.7 mmol/L (ref 3.5–5.1)
Potassium: 5.2 mmol/L — ABNORMAL HIGH (ref 3.5–5.1)
Sodium: 138 mmol/L (ref 135–145)
Sodium: 141 mmol/L (ref 135–145)

## 2019-10-08 LAB — TYPE AND SCREEN
ABO/RH(D): O POS
Antibody Screen: NEGATIVE
Unit division: 0

## 2019-10-08 MED ORDER — AMLODIPINE BESYLATE 10 MG PO TABS
10.0000 mg | ORAL_TABLET | Freq: Every day | ORAL | Status: DC
  Administered 2019-10-08 – 2019-10-12 (×5): 10 mg via ORAL
  Filled 2019-10-08 (×5): qty 1

## 2019-10-08 MED ORDER — POLYETHYLENE GLYCOL 3350 17 G PO PACK
17.0000 g | PACK | Freq: Every day | ORAL | Status: DC
  Administered 2019-10-08 – 2019-10-12 (×3): 17 g via ORAL
  Filled 2019-10-08 (×3): qty 1

## 2019-10-08 NOTE — Progress Notes (Signed)
   Subjective: HD#4   Overnight: No acute events reported   Today, Deanna Landry was examined at bedside lying comfortably in bed.  She did endorse pain at the right ankle however she was not aware that she had IV Dilaudid for breakthrough.  She denies fevers, chills, nausea, vomiting.  She is tolerating her diet without any issues.  She is also aware of her repeat I&D tomorrow.  Objective:  Vital signs in last 24 hours: Vitals:   10/07/19 2108 10/08/19 0018 10/08/19 0449 10/08/19 0913  BP: 133/61 (!) 143/65 (!) 148/67 (!) 158/65  Pulse: 86 91 81 88  Resp: (!) 21 (!) 25 18 (!) 21  Temp: 98.6 F (37 C) 98.6 F (37 C) 98.5 F (36.9 C) 98.1 F (36.7 C)  TempSrc: Oral Oral Oral Oral  SpO2: 100% 100% 100% 100%  Weight:      Height:       Const: In no apparent distress, lying comfortably in bed, conversational Ext: Wound VAC in place at the right lower extremity  Assessment/Plan:  Principal Problem:   Wound infection after surgery (Right ankle) Active Problems:   Type 1 diabetes mellitus with ketoacidosis without coma (Deanna Landry)   Diabetic polyneuropathy (Deanna Landry)   Hypertension  [name] is a 48 year old female with history of CHF, DMII, HTN, CKDIII, prior CVA with right lower extremity weakness, CAD, COPD and legal blindness s/p ORIF of right ankle in September presenting with a right ankle cellulitis for one week duration s/p I&D with wound vac placement.  Right ankle infection s/p I&D: Intraoperative wound culture growing MSSA.  She has remained afebrile and without leukocytosis.  She is well aware of her pain regimen. - Repeat I&D with wound vac change on Monday with Dr. Doreatha Martin - Continue IV Ancef Day 2 - Dilaudid 0.5mg  q6h prn - CBC daily  - PT/OT recommends home health with 24-hour supervision  Hyperglycemia in setting of Type I DM/Type II DM: - Continue levemir 25U bid + Novolog 7U tid + SSI + qHS coverage  - CBG monitoring  CKDIII: A.m. labs pending patient baseline  sCr 2.34.  - Continue to monitor - Avoid nephrotoxic agents - BMP daily   Hx of CVA with RLE weakness: - Continue atorvastatin 80mg  qd - Continue aspirin 81mg  qd and plavix 75mg  qd   Dispo: Anticipated discharge pending clinical improvement.   Jean Rosenthal, MD 10/08/2019, 1:30 PM Pager: [phone] Internal Medicine Teaching Service

## 2019-10-08 NOTE — Progress Notes (Signed)
    Subjective: Patient reports pain as mild, controlled.  Tolerating diet.  No BM during this admission.    Verbalizes understanding of plan for repeat I&D on Monday 10/09/19.  Objective:   VITALS:   Vitals:   10/07/19 2108 10/08/19 0018 10/08/19 0449 10/08/19 0913  BP: 133/61 (!) 143/65 (!) 148/67 (!) 158/65  Pulse: 86 91 81 88  Resp: (!) 21 (!) 25 18 (!) 21  Temp: 98.6 F (37 C) 98.6 F (37 C) 98.5 F (36.9 C) 98.1 F (36.7 C)  TempSrc: Oral Oral Oral Oral  SpO2: 100% 100% 100% 100%  Weight:      Height:       CBC Latest Ref Rng & Units 10/07/2019 10/07/2019 10/07/2019  WBC 4.0 - 10.5 K/uL 4.6 4.3 5.9  Hemoglobin 12.0 - 15.0 g/dL 8.5(L) 7.5(L) 7.7(L)  Hematocrit 36.0 - 46.0 % 27.1(L) 23.6(L) 24.4(L)  Platelets 150 - 400 K/uL 256 246 247   BMP Latest Ref Rng & Units 10/07/2019 10/07/2019 10/06/2019  Glucose 70 - 99 mg/dL 184(H) 137(H) 440(H)  BUN 6 - 20 mg/dL 29(H) 28(H) 37(H)  Creatinine 0.44 - 1.00 mg/dL 1.91(H) 1.88(H) 2.07(H)  Sodium 135 - 145 mmol/L 138 137 130(L)  Potassium 3.5 - 5.1 mmol/L 5.2(H) 4.1 5.4(H)  Chloride 98 - 111 mmol/L 103 101 97(L)  CO2 22 - 32 mmol/L 26 27 23   Calcium 8.9 - 10.3 mg/dL 8.2(L) 8.2(L) 8.6(L)   Intake/Output      11/21 0701 - 11/22 0700 11/22 0701 - 11/23 0700   I.V. (mL/kg) 975.7 (11.9)    Blood 720    IV Piggyback 100    Total Intake(mL/kg) 1795.7 (21.8)    Urine (mL/kg/hr) 1425 (0.7)    Blood     Total Output 1425    Net +370.7         Urine Occurrence 600 x       Physical Exam: General: NAD.  Upright in bed.  Calm, conversant. MSK RLE: Foot warm Dressings, ASO, and wound VAC in place.  Mild serosanguineous drainage in canister. Gross sensation intact distally.  EHL/FHL intact  Assessment: 2 Days Post-Op  S/P Procedure(s) (LRB): IRRIGATION AND DEBRIDEMENT EXTREMITY (Right) HARDWARE REMOVAL (Right) by Dr. Doreatha Martin on 10/06/2019  Principal Problem:   Wound infection after surgery (Right ankle) Active  Problems:   Type 1 diabetes mellitus with ketoacidosis without coma ([facility])   Diabetic polyneuropathy ([facility])   Hypertension   Right ankle postoperative infection, status post I/D, removal of hardware, wound VAC placement Doing well postop day 2 Tolerating diet Pain controlled   Plan: Dr. Doreatha Martin planning for repeat I&D and wound VAC change on Monday, 10/09/2019 Mobilize Incentive Spirometry Elevate Antibiotics changed to Ancef based on cultures - MSSA  Weightbearing: NWB RLE Insicional and dressing care: Maintain incisional VAC VTE prophylaxis: Per primary team (on Plavix, heparin), SCD, mobilize Pain control: Continue current regimen   Prudencio Burly III, PA-C  10/08/2019, 9:25 AM

## 2019-10-09 ENCOUNTER — Inpatient Hospital Stay (HOSPITAL_COMMUNITY): Payer: Medicare HMO | Admitting: Certified Registered Nurse Anesthetist

## 2019-10-09 ENCOUNTER — Encounter (HOSPITAL_COMMUNITY)
Admission: EM | Disposition: A | Discharge status: Home Health | Payer: Self-pay | Source: Home / Self Care | Attending: Internal Medicine

## 2019-10-09 DIAGNOSIS — L089 Local infection of the skin and subcutaneous tissue, unspecified: Secondary | ICD-10-CM

## 2019-10-09 DIAGNOSIS — Z978 Presence of other specified devices: Secondary | ICD-10-CM

## 2019-10-09 HISTORY — PX: I & D EXTREMITY: SHX5045

## 2019-10-09 LAB — GLUCOSE, CAPILLARY
Glucose-Capillary: 209 mg/dL — ABNORMAL HIGH (ref 70–99)
Glucose-Capillary: 80 mg/dL (ref 70–99)
Glucose-Capillary: 157 mg/dL — ABNORMAL HIGH (ref 70–99)
Glucose-Capillary: 172 mg/dL — ABNORMAL HIGH (ref 70–99)
Glucose-Capillary: 303 mg/dL — ABNORMAL HIGH (ref 70–99)

## 2019-10-09 LAB — BASIC METABOLIC PANEL
Anion gap: 5 (ref 5–15)
Anion gap: 4 — ABNORMAL LOW (ref 5–15)
BUN: 19 mg/dL (ref 6–20)
BUN: 15 mg/dL (ref 6–20)
CO2: 30 mmol/L (ref 22–32)
CO2: 32 mmol/L (ref 22–32)
Calcium: 8.2 mg/dL — ABNORMAL LOW (ref 8.9–10.3)
Calcium: 8.6 mg/dL — ABNORMAL LOW (ref 8.9–10.3)
Chloride: 104 mmol/L (ref 98–111)
Chloride: 103 mmol/L (ref 98–111)
Creatinine, Ser: 2.03 mg/dL — ABNORMAL HIGH (ref 0.44–1.00)
Creatinine, Ser: 1.54 mg/dL — ABNORMAL HIGH (ref 0.44–1.00)
GFR calc Af Amer: 33 mL/min — ABNORMAL LOW (ref >60)
GFR calc Af Amer: 46 mL/min — ABNORMAL LOW (ref >60)
GFR calc non Af Amer: 28 mL/min — ABNORMAL LOW (ref >60)
GFR calc non Af Amer: 40 mL/min — ABNORMAL LOW (ref >60)
Glucose, Bld: 175 mg/dL — ABNORMAL HIGH (ref 70–99)
Glucose, Bld: 134 mg/dL — ABNORMAL HIGH (ref 70–99)
Potassium: 5.3 mmol/L — ABNORMAL HIGH (ref 3.5–5.1)
Potassium: 5 mmol/L (ref 3.5–5.1)
Sodium: 139 mmol/L (ref 135–145)
Sodium: 139 mmol/L (ref 135–145)

## 2019-10-09 LAB — CREATININE, URINE, RANDOM: Creatinine, Urine: 33.67 mg/dL

## 2019-10-09 LAB — SODIUM, URINE, RANDOM: Sodium, Ur: 135 mmol/L

## 2019-10-09 LAB — CBC
HCT: 26.8 % — ABNORMAL LOW (ref 36.0–46.0)
Hemoglobin: 8.3 g/dL — ABNORMAL LOW (ref 12.0–15.0)
MCH: 27.8 pg (ref 26.0–34.0)
MCHC: 31 g/dL (ref 30.0–36.0)
MCV: 89.6 fL (ref 80.0–100.0)
Platelets: 269 10*3/uL (ref 150–400)
RBC: 2.99 MIL/uL — ABNORMAL LOW (ref 3.87–5.11)
RDW: 14.8 % (ref 11.5–15.5)
WBC: 4.6 10*3/uL (ref 4.0–10.5)
nRBC: 0 % (ref 0.0–0.2)

## 2019-10-09 SURGERY — IRRIGATION AND DEBRIDEMENT EXTREMITY
Anesthesia: General | Laterality: Right

## 2019-10-09 MED ORDER — 0.9 % SODIUM CHLORIDE (POUR BTL) OPTIME
TOPICAL | Status: DC | PRN
  Administered 2019-10-09: 1000 mL

## 2019-10-09 MED ORDER — LIDOCAINE 2% (20 MG/ML) 5 ML SYRINGE
INTRAMUSCULAR | Status: DC | PRN
  Administered 2019-10-09: 100 mg via INTRAVENOUS

## 2019-10-09 MED ORDER — LACTATED RINGERS IV BOLUS: 500.0000 mL | Freq: Once | INTRAVENOUS | Status: DC

## 2019-10-09 MED ORDER — SODIUM CHLORIDE 0.9 % IV SOLN
INTRAVENOUS | Status: DC | PRN
  Administered 2019-10-09: 18:00:00 via INTRAVENOUS

## 2019-10-09 MED ORDER — INSULIN ASPART 100 UNIT/ML ~~LOC~~ SOLN
0.0000 [IU] | SUBCUTANEOUS | Status: DC
  Administered 2019-10-09: 15 [IU] via SUBCUTANEOUS
  Administered 2019-10-09: 4 [IU] via SUBCUTANEOUS
  Administered 2019-10-10: 15 [IU] via SUBCUTANEOUS

## 2019-10-09 MED ORDER — SODIUM CHLORIDE 0.9 % IR SOLN
Status: DC | PRN
  Administered 2019-10-09: 3000 mL

## 2019-10-09 MED ORDER — PROMETHAZINE HCL 25 MG/ML IJ SOLN: 6.2500 mg | INTRAMUSCULAR | Status: DC | PRN

## 2019-10-09 MED ORDER — MIDAZOLAM HCL 2 MG/2ML IJ SOLN
INTRAMUSCULAR | Status: AC
  Filled 2019-10-09: qty 2

## 2019-10-09 MED ORDER — PHENYLEPHRINE HCL (PRESSORS) 10 MG/ML IV SOLN
INTRAVENOUS | Status: DC | PRN
  Administered 2019-10-09: 80 ug via INTRAVENOUS

## 2019-10-09 MED ORDER — FENTANYL CITRATE (PF) 250 MCG/5ML IJ SOLN
INTRAMUSCULAR | Status: AC
  Filled 2019-10-09: qty 5

## 2019-10-09 MED ORDER — FENTANYL CITRATE (PF) 100 MCG/2ML IJ SOLN
25.0000 ug | INTRAMUSCULAR | Status: DC | PRN
  Administered 2019-10-09 (×3): 50 ug via INTRAVENOUS

## 2019-10-09 MED ORDER — FENTANYL CITRATE (PF) 100 MCG/2ML IJ SOLN
INTRAMUSCULAR | Status: AC
  Filled 2019-10-09: qty 2

## 2019-10-09 MED ORDER — INSULIN ASPART 100 UNIT/ML ~~LOC~~ SOLN
3.0000 [IU] | SUBCUTANEOUS | Status: AC
  Administered 2019-10-09: 3 [IU] via SUBCUTANEOUS

## 2019-10-09 MED ORDER — MIDAZOLAM HCL 2 MG/2ML IJ SOLN
INTRAMUSCULAR | Status: DC | PRN
  Administered 2019-10-09: 2 mg via INTRAVENOUS

## 2019-10-09 MED ORDER — PROPOFOL 10 MG/ML IV BOLUS
INTRAVENOUS | Status: DC | PRN
  Administered 2019-10-09: 150 mg via INTRAVENOUS

## 2019-10-09 MED ORDER — OXYCODONE HCL 5 MG PO TABS
ORAL_TABLET | ORAL | Status: AC
  Filled 2019-10-09: qty 1

## 2019-10-09 MED ORDER — LACTATED RINGERS IV SOLN
INTRAVENOUS | Status: DC
  Administered 2019-10-09 – 2019-10-12 (×3): via INTRAVENOUS

## 2019-10-09 MED ORDER — FENTANYL CITRATE (PF) 250 MCG/5ML IJ SOLN
INTRAMUSCULAR | Status: DC | PRN
  Administered 2019-10-09 (×3): 50 ug via INTRAVENOUS

## 2019-10-09 MED ORDER — INSULIN ASPART 100 UNIT/ML ~~LOC~~ SOLN: 0.0000 [IU] | SUBCUTANEOUS | Status: DC

## 2019-10-09 MED ORDER — ONDANSETRON HCL 4 MG/2ML IJ SOLN
INTRAMUSCULAR | Status: DC | PRN
  Administered 2019-10-09: 4 mg via INTRAVENOUS

## 2019-10-09 MED ORDER — ONDANSETRON HCL 4 MG/2ML IJ SOLN
INTRAMUSCULAR | Status: AC
  Filled 2019-10-09: qty 2

## 2019-10-09 SURGICAL SUPPLY — 52 items
BNDG COHESIVE 4X5 TAN STRL (GAUZE/BANDAGES/DRESSINGS) ×3 IMPLANT
BNDG ELASTIC 6X10 VLCR STRL LF (GAUZE/BANDAGES/DRESSINGS) ×2 IMPLANT
BNDG GAUZE ELAST 4 BULKY (GAUZE/BANDAGES/DRESSINGS) ×6 IMPLANT
BRUSH SCRUB EZ PLAIN DRY (MISCELLANEOUS) ×6 IMPLANT
CANISTER WOUNDNEG PRESSURE 500 (CANNISTER) ×2 IMPLANT
CHLORAPREP W/TINT 26 (MISCELLANEOUS) ×3 IMPLANT
COVER MAYO STAND STRL (DRAPES) ×3 IMPLANT
COVER SURGICAL LIGHT HANDLE (MISCELLANEOUS) ×6 IMPLANT
COVER WAND RF STERILE (DRAPES) ×3 IMPLANT
DRAPE ORTHO SPLIT 77X108 STRL (DRAPES) ×2
DRAPE SURG 17X23 STRL (DRAPES) ×3 IMPLANT
DRAPE SURG ORHT 6 SPLT 77X108 (DRAPES) ×1 IMPLANT
DRAPE U-SHAPE 47X51 STRL (DRAPES) ×3 IMPLANT
DRESSING VERAFLO CLEANSE CC (GAUZE/BANDAGES/DRESSINGS) IMPLANT
DRSG ADAPTIC 3X8 NADH LF (GAUZE/BANDAGES/DRESSINGS) ×3 IMPLANT
DRSG VERAFLO CLEANSE CC (GAUZE/BANDAGES/DRESSINGS) ×3
ELECT REM PT RETURN 9FT ADLT (ELECTROSURGICAL)
ELECTRODE REM PT RTRN 9FT ADLT (ELECTROSURGICAL) IMPLANT
EVACUATOR 1/8 PVC DRAIN (DRAIN) IMPLANT
GAUZE SPONGE 4X4 12PLY STRL (GAUZE/BANDAGES/DRESSINGS) ×3 IMPLANT
GLOVE BIO SURGEON STRL SZ 6.5 (GLOVE) ×6 IMPLANT
GLOVE BIO SURGEON STRL SZ7.5 (GLOVE) ×12 IMPLANT
GLOVE BIO SURGEONS STRL SZ 6.5 (GLOVE) ×3
GLOVE BIOGEL PI IND STRL 6.5 (GLOVE) ×1 IMPLANT
GLOVE BIOGEL PI IND STRL 7.5 (GLOVE) ×1 IMPLANT
GLOVE BIOGEL PI INDICATOR 6.5 (GLOVE) ×2
GLOVE BIOGEL PI INDICATOR 7.5 (GLOVE) ×2
GOWN STRL REUS W/ TWL LRG LVL3 (GOWN DISPOSABLE) ×2 IMPLANT
GOWN STRL REUS W/TWL LRG LVL3 (GOWN DISPOSABLE) ×4
HANDPIECE INTERPULSE COAX TIP (DISPOSABLE)
KIT BASIN OR (CUSTOM PROCEDURE TRAY) ×3 IMPLANT
KIT TURNOVER KIT B (KITS) ×3 IMPLANT
MANIFOLD NEPTUNE II (INSTRUMENTS) ×3 IMPLANT
NS IRRIG 1000ML POUR BTL (IV SOLUTION) ×3 IMPLANT
PACK ORTHO EXTREMITY (CUSTOM PROCEDURE TRAY) ×3 IMPLANT
PAD ARMBOARD 7.5X6 YLW CONV (MISCELLANEOUS) ×6 IMPLANT
PAD NEG PRESSURE SENSATRAC (MISCELLANEOUS) ×2 IMPLANT
PADDING CAST COTTON 6X4 STRL (CAST SUPPLIES) ×3 IMPLANT
SET HNDPC FAN SPRY TIP SCT (DISPOSABLE) IMPLANT
SPONGE LAP 18X18 RF (DISPOSABLE) ×3 IMPLANT
SUT ETHILON 2 0 FS 18 (SUTURE) ×6 IMPLANT
SUT ETHILON 3 0 PS 1 (SUTURE) ×6 IMPLANT
SUT MON AB 2-0 CT1 36 (SUTURE) ×3 IMPLANT
SUT PDS AB 0 CT 36 (SUTURE) IMPLANT
SWAB CULTURE ESWAB REG 1ML (MISCELLANEOUS) IMPLANT
TOWEL GREEN STERILE (TOWEL DISPOSABLE) ×6 IMPLANT
TOWEL GREEN STERILE FF (TOWEL DISPOSABLE) ×3 IMPLANT
TUBE CONNECTING 12'X1/4 (SUCTIONS) ×1
TUBE CONNECTING 12X1/4 (SUCTIONS) ×2 IMPLANT
UNDERPAD 30X30 (UNDERPADS AND DIAPERS) ×3 IMPLANT
WATER STERILE IRR 1000ML POUR (IV SOLUTION) ×3 IMPLANT
YANKAUER SUCT BULB TIP NO VENT (SUCTIONS) ×3 IMPLANT

## 2019-10-09 NOTE — Progress Notes (Signed)
Ortho Trauma Note  Pain controlled. MSSA on cultures. Return to OR for repeat irrigation and debridement with wound vac change +/- closure. Patient agrees to proceed with surgery and consent obtained.  Shona Needles, MD [facility] [phone] (office) [email or web address]

## 2019-10-09 NOTE — Progress Notes (Signed)
   Subjective: HD#5  No acute overnight events reported.  Deanna Landry was examined at bedside this morning. She was sleeping on presentation but easily arousable. She has no acute concerns and reports minimal pain in her right leg with the pain medications. She is prepared for repeat I&D this afternoon.   Objective:  Vital signs in last 24 hours: Vitals:   10/08/19 1640 10/08/19 2140 10/08/19 2351 10/09/19 0432  BP: (!) 150/74 (!) 162/69 (!) 146/67 (!) 167/69  Pulse: 84 87 79 81  Resp: 20 19 16 15   Temp: 98 F (36.7 C) 98.4 F (36.9 C) 98.6 F (37 C) 98.3 F (36.8 C)  TempSrc: Oral Oral Oral Oral  SpO2: 100% 100% 100% 100%  Weight:      Height:       Physical Exam Constitutional:      General: She is not in acute distress.    Appearance: Normal appearance. She is not ill-appearing.  Cardiovascular:     Rate and Rhythm: Normal rate and regular rhythm.     Pulses: Normal pulses.     Heart sounds: Normal heart sounds.  Musculoskeletal:     Comments: RLE with wound vac in place draining 165ml of bloody fluids  Neurological:     Mental Status: She is alert.     Assessment/Plan:  Deanna Landry is a 48 year old female with history of CHF, DMII, HTN, CKDIII, prior CVA with right lower extremity weakness, CAD, COPD and legal blindness s/p ORIF of right ankle in September presenting with a right ankle cellulitis for one week duration s/p I&D with wound vac placement.  Right ankle infection s/p I&D: Intraoperative wound culture growing MSSA.  She has remained afebrile and without leukocytosis.  She reports minimal pain with her current regimen.  Patient evaluated by PT/OT and recommended for home health w/ 24 hr supervision  - Repeat I&D with wound vac change today with Dr. Doreatha Martin - Continue IV Ancef Day 4 - Dilaudid 0.5mg  q6h prn - CBC daily   Hyperglycemia in setting of Type I DM/Type II DM: - Continue levemir 25U bid + Novolog 7U tid + SSI + qHS coverage  - CBG  monitoring q3h  CKDIII: Patient baseline sCr 2.34. This morning, had increase in sCr 1.75>2.03. This could be in setting of ancef use vs pre-renal as she has had -2L UOP overnight but has been receiving LR infusion.  - Checking urine Na and Cr  - LR 500cc bolus  - Continue to monitor - Avoid nephrotoxic agents - BMP daily   Hx of CVA with RLE weakness: - Continue atorvastatin 80mg  qd - Continue aspirin 81mg  qd and plavix 75mg  qd   Dispo: Anticipated discharge pending clinical improvement.   Harvie Heck, MD  Internal Medicine, PGY-1 10/09/2019, 6:24 AM Pager: [phone]

## 2019-10-09 NOTE — Transfer of Care (Signed)
Immediate Anesthesia Transfer of Care Note  Patient: Deanna Landry  Procedure(s) Performed: IRRIGATION AND DEBRIDEMENT EXTREMITY and WOUND VAC CHANGE RIGHT ANKLE (Right )  Patient Location: PACU  Anesthesia Type:General  Level of Consciousness: awake, alert  and oriented  Airway & Oxygen Therapy: Patient Spontanous Breathing and Patient connected to face mask oxygen  Post-op Assessment: Report given to RN, Post -op Vital signs reviewed and stable and Patient moving all extremities  Post vital signs: Reviewed and stable  Last Vitals:  Vitals Value Taken Time  BP    Temp 37.2 C 10/09/19 1829  Pulse 94 10/09/19 1831  Resp 16 10/09/19 1831  SpO2 96 % 10/09/19 1831  Vitals shown include unvalidated device data.  Last Pain:  Vitals:   10/09/19 1227  TempSrc: Oral  PainSc:       Patients Stated Pain Goal: 3 ([id number])  Complications: No apparent anesthesia complications

## 2019-10-09 NOTE — Progress Notes (Addendum)
Inpatient Diabetes Program Recommendations  AACE/ADA: New Consensus Statement on Inpatient Glycemic Control (2015)  Target Ranges:  Prepandial:   less than 140 mg/dL      Peak postprandial:   less than 180 mg/dL (1-2 hours)      Critically ill patients:  140 - 180 mg/dL   Lab Results  Component Value Date   GLUCAP 209 (H) 10/09/2019   HGBA1C 7.7 (H) 10/05/2019    Review of Glycemic Control Results for ROMY, WEEBER (MRN [id number]) as of 10/09/2019 10:24  Ref. Range 10/08/2019 11:41 10/08/2019 13:05 10/08/2019 16:20 10/08/2019 21:39 10/09/2019 06:33  Glucose-Capillary Latest Ref Range: 70 - 99 mg/dL 69 (L) 85 110 (H) 84 209 (H)  Diabetes history: Type 1 DM Outpatient Diabetes medications: Levemir 36 units BID, Novolog 10 units TID, Trulicity 1.5 mg Qwk Current orders for Inpatient glycemic control: Levemir 25 units bid, Novolog resistant q 4 hours Inpatient Diabetes Program Recommendations:    Note patient NPO for I&D this after noon.  Recommend reducing Novolog correction to sensitive q 4 hours due to history of Type 1 DM.  Also may need slight reduction in Levemir to 22 units bid.   Thanks  Adah Perl, RN, BC-ADM Inpatient Diabetes Coordinator Pager [phone] (8a-5p)

## 2019-10-09 NOTE — Anesthesia Preprocedure Evaluation (Signed)
Anesthesia Evaluation  Patient identified by MRN, date of birth, ID band Patient awake    Reviewed: Allergy & Precautions, NPO status , Patient's Chart, lab work & pertinent test results  Airway Mallampati: II  TM Distance: >3 FB Neck ROM: Full    Dental no notable dental hx. (+) Teeth Intact, Dental Advisory Given   Pulmonary pneumonia,    Pulmonary exam normal breath sounds clear to auscultation       Cardiovascular hypertension, Pt. on medications + Past MI and +CHF  Normal cardiovascular exam Rhythm:Regular Rate:Normal     Neuro/Psych PSYCHIATRIC DISORDERS Depression CVA    GI/Hepatic negative GI ROS, Neg liver ROS,   Endo/Other  diabetes, Type 1  Renal/GU CRF and Renal InsufficiencyRenal diseaseK+ 5.0 Cr 2.68     Musculoskeletal negative musculoskeletal ROS (+)   Abdominal   Peds  Hematology Hgb 9.2 Plt 250   Anesthesia Other Findings Pt legally blind  Reproductive/Obstetrics                             Anesthesia Physical  Anesthesia Plan  ASA: IV  Anesthesia Plan: General   Post-op Pain Management:    Induction: Intravenous  PONV Risk Score and Plan: Treatment may vary due to age or medical condition  Airway Management Planned: Oral ETT  Additional Equipment: None  Intra-op Plan:   Post-operative Plan:   Informed Consent: I have reviewed the patients History and Physical, chart, labs and discussed the procedure including the risks, benefits and alternatives for the proposed anesthesia with the patient or authorized representative who has indicated his/her understanding and acceptance.     Dental advisory given  Plan Discussed with: CRNA  Anesthesia Plan Comments:         Anesthesia Quick Evaluation

## 2019-10-09 NOTE — Progress Notes (Signed)
PT Cancellation Note  Patient Details Name: Deanna Landry MRN: [id number] DOB: [birth year]   Cancelled Treatment:    Reason Eval/Treat Not Completed: Patient declined, no reason specified. Pt refusing PT this afternoon secondary to I&D surgery scheduled for this afternoon. Pt stated they had already begun prepping her for her surgery and she would rather wait for PT. PT will follow up as able and pt medically appropriate.   Zachary George PT, DPT 3:32 PM,10/09/19   Deanna Landry Deanna Landry 10/09/2019, 3:31 PM

## 2019-10-09 NOTE — Progress Notes (Signed)
Orthopedic Tech Progress Note Patient Details:  Deanna Landry 1971-09-14 [id number]  Ortho Devices Type of Ortho Device: Prafo boot/shoe Ortho Device/Splint Location: right Ortho Device/Splint Interventions: Application   Post Interventions Patient Tolerated: Well Instructions Provided: Care of device   [place] Pink 10/09/2019, 6:34 PM

## 2019-10-09 NOTE — Op Note (Signed)
Orthopaedic Surgery Operative Note (CSN: [id number] ) Date of Surgery: 10/09/2019  Admit Date: 10/03/2019   Diagnoses: Pre-Op Diagnoses: Right postoperative ankle infection   Post-Op Diagnosis: Same  Procedures: 1. CPT 11043-Irrigation and debridement of right ankle 2. CPT 97605-Wound vac placement right leg  Surgeons : Primary: Haddix, Thomasene Lot, MD  Assistant: Patrecia Pace, PA-C  Location: OR 5   Anesthesia:General  Antibiotics: Ancef 2g schedule   Tourniquet time:None  Estimated Blood Loss:Minimal  Complications:None  Specimens:None   Implants: * No implants in log *   Indications for Surgery: 48 year old female who has a history of diabetes who sustained a trimalleolar ankle fracture in September of this year.  She presented with a postoperative infection.  She underwent initial irrigation debridement wound VAC placement.  She returns today for repeat irrigation debridement possible closure with possible wound VAC change.  Risks and benefits were discussed with patient.  She agrees to proceed with surgery and consent was obtained.  Operative Findings: 1. Repeat irrigation and debridement of right ankle with partial closure of wound 2. Placement of wound vac to right ankle  Procedure: The patient was identified in the preoperative holding area. Consent was confirmed with the patient and their family and all questions were answered. The operative extremity was marked after confirmation with the patient. she was then brought back to the operating room by our anesthesia colleagues.  She was carefully transferred over to a regular or table.  She was placed under general anesthetic.  The wound VAC was removed.  The leg was prepped and draped in usual sterile fashion.  A timeout was performed to verify the patient procedure and extremity.  For started out by using a Cobb elevator to debride the soft tissue.  There is no purulence.  The tissues were healthy appearing.  I then  irrigated the wound with 3 L of normal saline.  I then performed partial closure using retention sutures of 2-0 nylon.  There was a small area approximately 2 x 5 cm in size that was unable to be closed.  A vera flow gray sponge was used as a wound VAC.  There is connected 225 mmHg.  Good seal was obtained.  A sterile dressing consisting of web roll and Ace wrap was placed.  Patient was awoken from anesthesia and taken to PACU in stable condition.  Post Op Plan/Instructions: Patient will return to the operating room on Wednesday for another wound VAC change with possible closure.  She will continue with a diabetic regimen and antibiotics per primary team.  Continue DVT prophylaxis per primary team.  I was present and performed the entire surgery.  Patrecia Pace, PA-C did assist me throughout the case. An assistant was necessary given the difficulty of wound care.   Katha Hamming, MD [facility]

## 2019-10-09 NOTE — Progress Notes (Signed)
Internal Medicine Attending Note:  I have seen and evaluated this patient and I have discussed the plan of care with the house staff. Please see their note for complete details. I concur with their findings.  Velna Ochs, MD 10/09/2019, 10:15 AM

## 2019-10-09 NOTE — Anesthesia Postprocedure Evaluation (Signed)
Anesthesia Post Note  Patient: Deanna Landry  Procedure(s) Performed: IRRIGATION AND DEBRIDEMENT EXTREMITY and WOUND VAC CHANGE RIGHT ANKLE (Right )     Patient location during evaluation: PACU Anesthesia Type: General Level of consciousness: awake and alert Pain management: pain level controlled Vital Signs Assessment: post-procedure vital signs reviewed and stable Respiratory status: spontaneous breathing, nonlabored ventilation and respiratory function stable Cardiovascular status: blood pressure returned to baseline and stable Postop Assessment: no apparent nausea or vomiting Anesthetic complications: no    Last Vitals:  Vitals:   10/09/19 1930 10/09/19 1945  BP: (!) 161/70 (!) 170/103  Pulse: 89 95  Resp: 13 18  Temp: 36.9 C 36.9 C  SpO2: 98% 97%    Last Pain:  Vitals:   10/09/19 1945  TempSrc: Oral  PainSc:                  Catalina Gravel

## 2019-10-09 NOTE — Anesthesia Procedure Notes (Signed)
Procedure Name: LMA Insertion Date/Time: 10/09/2019 5:48 PM Performed by: Amadeo Garnet, CRNA Pre-anesthesia Checklist: Patient identified, Emergency Drugs available, Suction available and Patient being monitored Patient Re-evaluated:Patient Re-evaluated prior to induction Oxygen Delivery Method: Circle system utilized Preoxygenation: Pre-oxygenation with 100% oxygen Induction Type: IV induction Ventilation: Mask ventilation without difficulty LMA: LMA inserted LMA Size: 4.0 Number of attempts: 1 Placement Confirmation: positive ETCO2 and breath sounds checked- equal and bilateral Tube secured with: Tape Dental Injury: Teeth and Oropharynx as per pre-operative assessment

## 2019-10-09 NOTE — Care Management Important Message (Signed)
Important Message  Patient Details  Name: Deanna Landry MRN: [id number] Date of Birth: November 03, 1971   Medicare Important Message Given:  Yes     Shelda Altes 10/09/2019, 12:06 PM

## 2019-10-10 ENCOUNTER — Encounter (HOSPITAL_COMMUNITY): Payer: Self-pay | Admitting: Student

## 2019-10-10 DIAGNOSIS — R739 Hyperglycemia, unspecified: Secondary | ICD-10-CM

## 2019-10-10 LAB — BASIC METABOLIC PANEL
Anion gap: 7 (ref 5–15)
BUN: 17 mg/dL (ref 6–20)
CO2: 30 mmol/L (ref 22–32)
Calcium: 8.7 mg/dL — ABNORMAL LOW (ref 8.9–10.3)
Chloride: 99 mmol/L (ref 98–111)
Creatinine, Ser: 1.69 mg/dL — ABNORMAL HIGH (ref 0.44–1.00)
GFR calc Af Amer: 41 mL/min — ABNORMAL LOW (ref >60)
GFR calc non Af Amer: 36 mL/min — ABNORMAL LOW (ref >60)
Glucose, Bld: 204 mg/dL — ABNORMAL HIGH (ref 70–99)
Potassium: 5.3 mmol/L — ABNORMAL HIGH (ref 3.5–5.1)
Sodium: 136 mmol/L (ref 135–145)

## 2019-10-10 LAB — CBC
HCT: 25.8 % — ABNORMAL LOW (ref 36.0–46.0)
Hemoglobin: 7.9 g/dL — ABNORMAL LOW (ref 12.0–15.0)
MCH: 27.6 pg (ref 26.0–34.0)
MCHC: 30.6 g/dL (ref 30.0–36.0)
MCV: 90.2 fL (ref 80.0–100.0)
Platelets: 263 10*3/uL (ref 150–400)
RBC: 2.86 MIL/uL — ABNORMAL LOW (ref 3.87–5.11)
RDW: 14.6 % (ref 11.5–15.5)
WBC: 5.9 10*3/uL (ref 4.0–10.5)
nRBC: 0 % (ref 0.0–0.2)

## 2019-10-10 LAB — GLUCOSE, CAPILLARY
Glucose-Capillary: 111 mg/dL — ABNORMAL HIGH (ref 70–99)
Glucose-Capillary: 138 mg/dL — ABNORMAL HIGH (ref 70–99)
Glucose-Capillary: 153 mg/dL — ABNORMAL HIGH (ref 70–99)
Glucose-Capillary: 174 mg/dL — ABNORMAL HIGH (ref 70–99)
Glucose-Capillary: 318 mg/dL — ABNORMAL HIGH (ref 70–99)

## 2019-10-10 MED ORDER — INSULIN ASPART 100 UNIT/ML ~~LOC~~ SOLN
0.0000 [IU] | Freq: Three times a day (TID) | SUBCUTANEOUS | Status: DC
  Administered 2019-10-10 (×2): 4 [IU] via SUBCUTANEOUS

## 2019-10-10 MED ORDER — INSULIN ASPART 100 UNIT/ML ~~LOC~~ SOLN: 0.0000 [IU] | Freq: Every day | SUBCUTANEOUS | Status: DC

## 2019-10-10 MED ORDER — INSULIN ASPART 100 UNIT/ML ~~LOC~~ SOLN
7.0000 [IU] | Freq: Three times a day (TID) | SUBCUTANEOUS | Status: DC
  Administered 2019-10-10 (×3): 7 [IU] via SUBCUTANEOUS

## 2019-10-10 MED ORDER — SODIUM ZIRCONIUM CYCLOSILICATE 5 G PO PACK
5.0000 g | PACK | Freq: Two times a day (BID) | ORAL | Status: AC
  Administered 2019-10-10 (×2): 5 g via ORAL
  Filled 2019-10-10 (×2): qty 1

## 2019-10-10 MED ORDER — OXYCODONE HCL 5 MG PO TABS: 5.0000 mg | ORAL_TABLET | ORAL | Status: DC

## 2019-10-10 MED ORDER — OXYCODONE HCL 5 MG PO TABS
5.0000 mg | ORAL_TABLET | ORAL | Status: DC | PRN
  Administered 2019-10-10 – 2019-10-12 (×11): 5 mg via ORAL
  Filled 2019-10-10 (×11): qty 1

## 2019-10-10 NOTE — Progress Notes (Signed)
   Subjective: HD#6  No acute overnight events reported.  [name] was examined at bedside this morning. She is eating her breakfast. She reports pain in her right lower extremity but reports is scheduled for her pain medication soon. She does not have any acute concerns at this time.   Objective:  Vital signs in last 24 hours: Vitals:   10/09/19 1915 10/09/19 1930 10/09/19 1945 10/10/19 0456  BP: (!) 157/58 (!) 161/70 (!) 170/103 132/62  Pulse: 90 89 95 81  Resp: 18 13 18 16   Temp:  98.4 F (36.9 C) 98.5 F (36.9 C) 98.2 F (36.8 C)  TempSrc:   Oral Oral  SpO2: 99% 98% 97% 98%  Weight:      Height:       Physical Exam Constitutional:      General: She is not in acute distress.    Appearance: Normal appearance. She is not ill-appearing.  Cardiovascular:     Rate and Rhythm: Normal rate and regular rhythm.     Pulses: Normal pulses.     Heart sounds: Normal heart sounds.  Musculoskeletal:     Comments: RLE with wound vac in place and wrapped in ortho boot  Neurological:     Mental Status: She is alert.     Assessment/Plan:  Deanna Landry is a 48 year old female with history of CHF, DMII, HTN, CKDIII, prior CVA with right lower extremity weakness, CAD, COPD and legal blindness s/p ORIF of right ankle in September presenting with a right ankle cellulitis for one week duration s/p I&D with wound vac placement.  Right ankle infection s/p I&D: Patient s/p repeat I&D with wound vac changed and partial closure of RLE wound yesterday. She tolerated it well and was evaluated this morning with ortho boot in place. She remains afebrile and without leukocytosis. She is continued on IV Ancef for MSSA.  Patient evaluated by PT/OT and recommended for home health w/ 24 hr supervision  - Wound vac change with possible closure of wound tomorrow with Dr. Doreatha Martin  - Continue IV Ancef Day 4 - Dilaudid 0.5mg  q6h prn - CBC daily   Hyperglycemia in setting of Type I DM/Type II DM: -  Continue levemir 25U bid + Novolog 7U tid + SSI + qHS coverage  - CBG monitoring q3h  CKDIII: Patient baseline sCr 2.34. Yesterday had increase in sCr to 2.04 which improved with 500 cc bolus. This AM, sCr 1.69.  - Continue to monitor - Avoid nephrotoxic agents - BMP daily   Hx of CVA with RLE weakness: - Continue atorvastatin 80mg  qd - Continue aspirin 81mg  qd and plavix 75mg  qd   Dispo: Anticipated discharge pending clinical improvement.   Harvie Heck, MD  Internal Medicine, PGY-1 10/10/2019, 6:45 AM Pager: [phone]

## 2019-10-10 NOTE — Progress Notes (Signed)
Inpatient Diabetes Program Recommendations  AACE/ADA: New Consensus Statement on Inpatient Glycemic Control (2015)  Target Ranges:  Prepandial:   less than 140 mg/dL      Peak postprandial:   less than 180 mg/dL (1-2 hours)      Critically ill patients:  140 - 180 mg/dL   Lab Results  Component Value Date   GLUCAP 111 (H) 10/10/2019   HGBA1C 7.7 (H) 10/05/2019    Review of Glycemic Control Results for Deanna Landry, Deanna Landry (MRN [id number]) as of 10/10/2019 10:15  Ref. Range 10/09/2019 16:22 10/09/2019 18:27 10/09/2019 21:18 10/10/2019 00:46 10/10/2019 06:24  Glucose-Capillary Latest Ref Range: 70 - 99 mg/dL 157 (H) 172 (H) 303 (H) 318 (H) 111 (H)   Diabetes history:Type 1 DM Outpatient Diabetes medications:Levemir 36 units BID, Novolog 10 units TID, Trulicity 1.5 mg Qwk Current orders for Inpatient glycemic control: Levemir 25 units bid, Novolog resistant tid with meals, Novolog 7 units tid with meals (meal coverage) Inpatient Diabetes Program Recommendations:    Please reduce Novolog correction to sensitive due to history of Type 1 DM.   Thanks  Adah Perl, RN, BC-ADM Inpatient Diabetes Coordinator Pager [phone] (8a-5p)

## 2019-10-10 NOTE — Progress Notes (Signed)
Orthopaedic Trauma Progress Note  S: Having pain after last nights surgery. States she was almost crying. Doing better this AM  O:  Vitals:   10/10/19 0456 10/10/19 0700  BP: 132/62 (!) 144/65  Pulse: 81 81  Resp: 16 20  Temp: 98.2 F (36.8 C) 99.2 F (37.3 C)  SpO2: 98% 100%   PRAFO boot in place. Wound vac with small output. Good suction  Imaging: No new imaging  Labs:  Results for orders placed or performed during the hospital encounter of 10/03/19 (from the past 24 hour(s))  Glucose, capillary     Status: None   Collection Time: 10/09/19 12:25 PM  Result Value Ref Range   Glucose-Capillary 80 70 - 99 mg/dL   Comment 1 Notify RN    Comment 2 Document in Chart   Sodium, urine, random     Status: None   Collection Time: 10/09/19 12:50 PM  Result Value Ref Range   Sodium, Ur 135 mmol/L  Creatinine, urine, random     Status: None   Collection Time: 10/09/19 12:50 PM  Result Value Ref Range   Creatinine, Urine 33.67 mg/dL  Basic metabolic panel     Status: Abnormal   Collection Time: 10/09/19  2:53 PM  Result Value Ref Range   Sodium 139 135 - 145 mmol/L   Potassium 5.0 3.5 - 5.1 mmol/L   Chloride 103 98 - 111 mmol/L   CO2 32 22 - 32 mmol/L   Glucose, Bld 134 (H) 70 - 99 mg/dL   BUN 15 6 - 20 mg/dL   Creatinine, Ser 1.54 (H) 0.44 - 1.00 mg/dL   Calcium 8.6 (L) 8.9 - 10.3 mg/dL   GFR calc non Af Amer 40 (L) >60 mL/min   GFR calc Af Amer 46 (L) >60 mL/min   Anion gap 4 (L) 5 - 15  Glucose, capillary     Status: Abnormal   Collection Time: 10/09/19  4:22 PM  Result Value Ref Range   Glucose-Capillary 157 (H) 70 - 99 mg/dL   Comment 1 Notify RN    Comment 2 Document in Chart   Glucose, capillary     Status: Abnormal   Collection Time: 10/09/19  6:27 PM  Result Value Ref Range   Glucose-Capillary 172 (H) 70 - 99 mg/dL   Comment 1 Notify RN    Comment 2 Document in Chart   Glucose, capillary     Status: Abnormal   Collection Time: 10/09/19  9:18 PM  Result Value  Ref Range   Glucose-Capillary 303 (H) 70 - 99 mg/dL  Glucose, capillary     Status: Abnormal   Collection Time: 10/10/19 12:46 AM  Result Value Ref Range   Glucose-Capillary 318 (H) 70 - 99 mg/dL  Basic metabolic panel     Status: Abnormal   Collection Time: 10/10/19  3:05 AM  Result Value Ref Range   Sodium 136 135 - 145 mmol/L   Potassium 5.3 (H) 3.5 - 5.1 mmol/L   Chloride 99 98 - 111 mmol/L   CO2 30 22 - 32 mmol/L   Glucose, Bld 204 (H) 70 - 99 mg/dL   BUN 17 6 - 20 mg/dL   Creatinine, Ser 1.69 (H) 0.44 - 1.00 mg/dL   Calcium 8.7 (L) 8.9 - 10.3 mg/dL   GFR calc non Af Amer 36 (L) >60 mL/min   GFR calc Af Amer 41 (L) >60 mL/min   Anion gap 7 5 - 15  CBC  Status: Abnormal   Collection Time: 10/10/19  3:05 AM  Result Value Ref Range   WBC 5.9 4.0 - 10.5 K/uL   RBC 2.86 (L) 3.87 - 5.11 MIL/uL   Hemoglobin 7.9 (L) 12.0 - 15.0 g/dL   HCT 25.8 (L) 36.0 - 46.0 %   MCV 90.2 80.0 - 100.0 fL   MCH 27.6 26.0 - 34.0 pg   MCHC 30.6 30.0 - 36.0 g/dL   RDW 14.6 11.5 - 15.5 %   Platelets 263 150 - 400 K/uL   nRBC 0.0 0.0 - 0.2 %  Glucose, capillary     Status: Abnormal   Collection Time: 10/10/19  6:24 AM  Result Value Ref Range   Glucose-Capillary 111 (H) 70 - 99 mg/dL    Assessment: 48 year old female s/p ORIF and postoperative infection  S/p multiple debridements. Will return to OR tomorrow AM for wound vac change and possible closure.  Continue Ancef for MSSA infection  Diabetes treatment per medicine team.  NPO past midnight  Shona Needles, MD [facility] [phone] (office) [email or web address]

## 2019-10-10 NOTE — Progress Notes (Signed)
  Date: 10/10/2019  Patient name: Deanna Landry record number: [id number]  Date of birth: 1971-11-16        I have seen and evaluated this patient and I have discussed the plan of care with the house staff. Please see their note for complete details. I concur with their findings.  Velna Ochs, MD 10/10/2019, 2:49 PM

## 2019-10-10 NOTE — TOC Initial Note (Signed)
Transition of Care (TOC) - Initial/Assessment Note  Marvetta Gibbons RN, BSN Transitions of Care Unit 4E- RN Case Manager [phone]   Patient Details  Name: Deanna Landry MRN: [id number] Date of Birth: Jun 26, 1971  Transition of Care [facility]) CM/SW Contact:    Dawayne Patricia, RN Phone Number: 10/10/2019, 10:27 AM  Clinical Narrative:                 Pt admitted with wound infection s/p recent ORIF of right ankle- s/p I&D and wound VAC placement- plan to return to OR on 11/25- may need wound VAC for home- will follow for transition of care needs- spoke with pt at bedside- per conversation pt reports she was active with [facility] at home for RN/PT/OT- would like to continue services with them for discharge. She also has home 02 at baseline with [facility] PCP is Dr. Truman Hayward in [place]. Pt will need resumption orders for [facility] needs prior to discharge- Cm will f/u post surgery on 11/25 for home wound VAC needs.    Expected Discharge Plan: [place] Barriers to Discharge: Continued Medical Work up   Patient Goals and CMS Choice Patient states their goals for this hospitalization and ongoing recovery are:: "to get better and heal then never do something like this again" CMS Medicare.gov Compare Post Acute Care list provided to:: Patient Choice offered to / list presented to : Patient  Expected Discharge Plan and Services Expected Discharge Plan: [place]   Discharge Planning Services: CM Consult Post Acute Care Choice: Home Health, Resumption of Svcs/PTA Provider Living arrangements for the past 2 months: Single Family Home                           HH Arranged: RN, PT, OT [facility] Agency: [facility] [place]        Prior Living Arrangements/Services Living arrangements for the past 2 months: Single Family Home Lives with:: Parents Patient language and need for interpreter reviewed:: Yes Do you feel safe  going back to the place where you live?: Yes      Need for Family Participation in Patient Care: Yes (Comment) Care giver support system in place?: Yes (comment) Current home services: DME, Home PT, Home RN, Home RT Criminal Activity/Legal Involvement Pertinent to Current Situation/Hospitalization: No - Comment as needed  Activities of Daily Living Home Assistive Devices/Equipment: Cane (specify quad or straight), Wheelchair, Environmental consultant (specify type) ADL Screening (condition at time of admission) Patient's cognitive ability adequate to safely complete daily activities?: Yes Is the patient deaf or have difficulty hearing?: No Does the patient have difficulty seeing, even when wearing glasses/contacts?: Yes Does the patient have difficulty concentrating, remembering, or making decisions?: No Patient able to express need for assistance with ADLs?: Yes Does the patient have difficulty dressing or bathing?: No Independently performs ADLs?: Yes (appropriate for developmental age) Does the patient have difficulty walking or climbing stairs?: Yes Weakness of Legs: Right Weakness of Arms/Hands: None  Permission Sought/Granted Permission sought to share information with : Investment banker, corporate granted to share info w AGENCY: [employer]        Emotional Assessment Appearance:: Appears stated age Attitude/Demeanor/Rapport: Engaged Affect (typically observed): Appropriate, Pleasant Orientation: : Oriented to Self, Oriented to Place, Oriented to  Time, Oriented to Situation   Psych Involvement: No (comment)  Admission diagnosis:  Wound infection [  T14.8XXA, L08.9] Patient Active Problem List   Diagnosis Date Noted  . Wound infection after surgery (Right ankle) 10/04/2019  . Closed right pilon fracture, initial encounter 07/26/2019  . Diabetes mellitus type 2 in obese ([place]) 07/26/2019  . Hypertension 07/26/2019  . Pneumonia 03/15/2019  . Type 1 diabetes mellitus with  ketoacidosis without coma ([place]) 03/15/2019  . Acute kidney injury superimposed on CKD ([place]) 03/15/2019  . Suicidal ideation 03/15/2019  . Acute respiratory failure with hypoxia ([place]) 03/15/2019  . Adjustment disorder with mixed anxiety and depressed mood 03/11/2019  . Fever 03/10/2019  . Sepsis ([place]) 03/10/2019  . MDD (major depressive disorder), severe ([place]) 03/09/2019  . Major depression, recurrent ([place]) 03/09/2019  . Diabetic polyneuropathy ([place]) 05/04/2016  . Neck pain 04/21/2016   PCP:  Cher Nakai, MD Pharmacy:   [id number] - [place], [place] - [address] [facility] [place] [address] Phone: [phone] Fax: [phone]     Social Determinants of Health (SDOH) Interventions    Readmission Risk Interventions No flowsheet data found.

## 2019-10-10 NOTE — H&P (View-Only) (Signed)
Orthopaedic Trauma Progress Note  S: Having pain after last nights surgery. States she was almost crying. Doing better this AM  O:  Vitals:   10/10/19 0456 10/10/19 0700  BP: 132/62 (!) 144/65  Pulse: 81 81  Resp: 16 20  Temp: 98.2 F (36.8 C) 99.2 F (37.3 C)  SpO2: 98% 100%   PRAFO boot in place. Wound vac with small output. Good suction  Imaging: No new imaging  Labs:  Results for orders placed or performed during the hospital encounter of 10/03/19 (from the past 24 hour(s))  Glucose, capillary     Status: None   Collection Time: 10/09/19 12:25 PM  Result Value Ref Range   Glucose-Capillary 80 70 - 99 mg/dL   Comment 1 Notify RN    Comment 2 Document in Chart   Sodium, urine, random     Status: None   Collection Time: 10/09/19 12:50 PM  Result Value Ref Range   Sodium, Ur 135 mmol/L  Creatinine, urine, random     Status: None   Collection Time: 10/09/19 12:50 PM  Result Value Ref Range   Creatinine, Urine 33.67 mg/dL  Basic metabolic panel     Status: Abnormal   Collection Time: 10/09/19  2:53 PM  Result Value Ref Range   Sodium 139 135 - 145 mmol/L   Potassium 5.0 3.5 - 5.1 mmol/L   Chloride 103 98 - 111 mmol/L   CO2 32 22 - 32 mmol/L   Glucose, Bld 134 (H) 70 - 99 mg/dL   BUN 15 6 - 20 mg/dL   Creatinine, Ser 1.54 (H) 0.44 - 1.00 mg/dL   Calcium 8.6 (L) 8.9 - 10.3 mg/dL   GFR calc non Af Amer 40 (L) >60 mL/min   GFR calc Af Amer 46 (L) >60 mL/min   Anion gap 4 (L) 5 - 15  Glucose, capillary     Status: Abnormal   Collection Time: 10/09/19  4:22 PM  Result Value Ref Range   Glucose-Capillary 157 (H) 70 - 99 mg/dL   Comment 1 Notify RN    Comment 2 Document in Chart   Glucose, capillary     Status: Abnormal   Collection Time: 10/09/19  6:27 PM  Result Value Ref Range   Glucose-Capillary 172 (H) 70 - 99 mg/dL   Comment 1 Notify RN    Comment 2 Document in Chart   Glucose, capillary     Status: Abnormal   Collection Time: 10/09/19  9:18 PM  Result Value  Ref Range   Glucose-Capillary 303 (H) 70 - 99 mg/dL  Glucose, capillary     Status: Abnormal   Collection Time: 10/10/19 12:46 AM  Result Value Ref Range   Glucose-Capillary 318 (H) 70 - 99 mg/dL  Basic metabolic panel     Status: Abnormal   Collection Time: 10/10/19  3:05 AM  Result Value Ref Range   Sodium 136 135 - 145 mmol/L   Potassium 5.3 (H) 3.5 - 5.1 mmol/L   Chloride 99 98 - 111 mmol/L   CO2 30 22 - 32 mmol/L   Glucose, Bld 204 (H) 70 - 99 mg/dL   BUN 17 6 - 20 mg/dL   Creatinine, Ser 1.69 (H) 0.44 - 1.00 mg/dL   Calcium 8.7 (L) 8.9 - 10.3 mg/dL   GFR calc non Af Amer 36 (L) >60 mL/min   GFR calc Af Amer 41 (L) >60 mL/min   Anion gap 7 5 - 15  CBC  Status: Abnormal   Collection Time: 10/10/19  3:05 AM  Result Value Ref Range   WBC 5.9 4.0 - 10.5 K/uL   RBC 2.86 (L) 3.87 - 5.11 MIL/uL   Hemoglobin 7.9 (L) 12.0 - 15.0 g/dL   HCT 25.8 (L) 36.0 - 46.0 %   MCV 90.2 80.0 - 100.0 fL   MCH 27.6 26.0 - 34.0 pg   MCHC 30.6 30.0 - 36.0 g/dL   RDW 14.6 11.5 - 15.5 %   Platelets 263 150 - 400 K/uL   nRBC 0.0 0.0 - 0.2 %  Glucose, capillary     Status: Abnormal   Collection Time: 10/10/19  6:24 AM  Result Value Ref Range   Glucose-Capillary 111 (H) 70 - 99 mg/dL    Assessment: 48 year old female s/p ORIF and postoperative infection  S/p multiple debridements. Will return to OR tomorrow AM for wound vac change and possible closure.  Continue Ancef for MSSA infection  Diabetes treatment per medicine team.  NPO past midnight  Shona Needles, MD [facility] [phone] (office) [email or web address]

## 2019-10-10 NOTE — Anesthesia Preprocedure Evaluation (Addendum)
Anesthesia Evaluation  Patient identified by MRN, date of birth, ID band Patient awake    Reviewed: Allergy & Precautions, H&P , NPO status , Patient's Chart, lab work & pertinent test results  Airway Mallampati: II  TM Distance: >3 FB Neck ROM: Full    Dental no notable dental hx. (+) Edentulous Upper, Edentulous Lower, Dental Advisory Given   Pulmonary neg pulmonary ROS,    Pulmonary exam normal breath sounds clear to auscultation       Cardiovascular Exercise Tolerance: Good hypertension, Pt. on medications +CHF   Rhythm:Regular Rate:Normal     Neuro/Psych Depression CVA, No Residual Symptoms    GI/Hepatic negative GI ROS, Neg liver ROS,   Endo/Other  negative endocrine ROSdiabetes, Insulin Dependent, Oral Hypoglycemic Agents  Renal/GU Renal InsufficiencyRenal disease  negative genitourinary   Musculoskeletal   Abdominal   Peds  Hematology negative hematology ROS (+)   Anesthesia Other Findings   Reproductive/Obstetrics negative OB ROS                           Anesthesia Physical Anesthesia Plan  ASA: III  Anesthesia Plan: General   Post-op Pain Management:    Induction: Intravenous  PONV Risk Score and Plan: 4 or greater and Ondansetron, Midazolam and Diphenhydramine  Airway Management Planned: LMA and Oral ETT  Additional Equipment:   Intra-op Plan:   Post-operative Plan: Extubation in OR  Informed Consent: I have reviewed the patients History and Physical, chart, labs and discussed the procedure including the risks, benefits and alternatives for the proposed anesthesia with the patient or authorized representative who has indicated his/her understanding and acceptance.     Dental advisory given  Plan Discussed with: CRNA  Anesthesia Plan Comments:         Anesthesia Quick Evaluation

## 2019-10-10 NOTE — Progress Notes (Signed)
Physical Therapy Treatment Patient Details Name: Deanna Landry MRN: [id number] DOB: [birth year] Today's Date: 10/10/2019    History of Present Illness Pt is a 48 yo female with PMH of of HTN, Type I DM, CKD IV, CVA, depression, anxiety, and legal blindness, presenting 10/03/19 with a right ankle wound after recent admission (07/2019) for surgical fixation of a closed R trimalleolar fx. S/p R ankle I&D and hardware removal on 11/20. Additional I&D with wound vac placement 11/23. Likely return to OR 11/25 for possible closure.   PT Comments    Pt progressing well with mobility. Able to transfer and hop on LLE with RW and min guard; reliant on assist for standing ADL tasks (pericare/washup). Reviewed precautions, positioning/edema control and importance of mobility. Will continue to follow acutely.    Follow Up Recommendations  Home health PT;Supervision/Assistance - 24 hour     Equipment Recommendations  None recommended by PT    Recommendations for Other Services       Precautions / Restrictions Precautions Precautions: Fall;Other (comment) Precaution Comments: blind Restrictions Weight Bearing Restrictions: Yes RLE Weight Bearing: Non weight bearing    Mobility  Bed Mobility Overal bed mobility: Modified Independent Bed Mobility: Supine to Sit           General bed mobility comments: Increased time and effort, HOB slightly elevated, use of bed rail  Transfers Overall transfer level: Needs assistance Equipment used: Rolling walker (2 wheeled) Transfers: Sit to/from Stand Sit to Stand: Min guard         General transfer comment: Stood from EOB and recliner to RW with min guard, heavy reliance on UE support to push into standing; good ability to maintain RLE NWB  Ambulation/Gait Ambulation/Gait assistance: Min guard Gait Distance (Feet): 2 Feet Assistive device: Rolling walker (2 wheeled)   Gait velocity: reduced   General Gait Details: Short hops on LLE from bed  to recliner with RW and min guard for balance; pt declining additional distance secondary to pain. Willing to stand again for [employer] [place] (Stroke Patients Only)       Balance Overall balance assessment: Needs assistance Sitting-balance support: No upper extremity supported;Feet unsupported Sitting balance-Leahy Scale: Good Sitting balance - Comments: Prolonged sitting EOB for ADL tasks and medications without UE support, indep   Standing balance support: Bilateral upper extremity supported Standing balance-Leahy Scale: Poor Standing balance comment: Reliant on UE support, especially to maintain RLE NWB; dependent for posterior pericare while standing                            Cognition Arousal/Alertness: Awake/alert Behavior During Therapy: WFL for tasks assessed/performed Overall Cognitive Status: Within Functional Limits for tasks assessed                                        Exercises      General Comments General comments (skin integrity, edema, etc.): SpO2 >90% on 2L O2 [place]      Pertinent Vitals/Pain Pain Assessment: 0-10 Pain Score: 8  Pain Location: RLE Pain Descriptors / Indicators: Burning Pain Intervention(s): Monitored during session;RN gave pain meds during session;Repositioned    Home Living  Prior Function            PT Goals (current goals can now be found in the care plan section) Progress towards PT goals: Progressing toward goals    Frequency    Min 3X/week      PT Plan Current plan remains appropriate    Co-evaluation              AM-PAC PT "6 Clicks" Mobility   Outcome Measure  Help needed turning from your back to your side while in a flat bed without using bedrails?: None Help needed moving from lying on your back to sitting on the side of a flat bed without using bedrails?: None Help needed moving to  and from a bed to a chair (including a wheelchair)?: A Little Help needed standing up from a chair using your arms (e.g., wheelchair or bedside chair)?: A Little Help needed to walk in hospital room?: A Little Help needed climbing 3-5 steps with a railing? : A Lot 6 Click Score: 19    End of Session Equipment Utilized During Treatment: Oxygen Activity Tolerance: Patient tolerated treatment well Patient left: in chair;with call bell/phone within reach;with chair alarm set Nurse Communication: Mobility status PT Visit Diagnosis: Other abnormalities of gait and mobility (R26.89)     Time: 0940-1005 PT Time Calculation (min) (ACUTE ONLY): 25 min  Charges:  $Therapeutic Activity: 23-37 mins                    Mabeline Caras, PT, DPT Acute Rehabilitation Services  Pager [phone] Office [place] 10/10/2019, 10:49 AM

## 2019-10-11 ENCOUNTER — Encounter (HOSPITAL_COMMUNITY): Payer: Self-pay | Admitting: Anesthesiology

## 2019-10-11 ENCOUNTER — Inpatient Hospital Stay (HOSPITAL_COMMUNITY): Payer: Medicare HMO | Admitting: Anesthesiology

## 2019-10-11 ENCOUNTER — Encounter (HOSPITAL_COMMUNITY)
Admission: EM | Disposition: A | Discharge status: Home Health | Payer: Self-pay | Source: Home / Self Care | Attending: Internal Medicine

## 2019-10-11 DIAGNOSIS — K567 Ileus, unspecified: Secondary | ICD-10-CM

## 2019-10-11 DIAGNOSIS — K9189 Other postprocedural complications and disorders of digestive system: Secondary | ICD-10-CM

## 2019-10-11 HISTORY — PX: APPLICATION OF WOUND VAC: SHX5189

## 2019-10-11 LAB — CBC
HCT: 24.9 % — ABNORMAL LOW (ref 36.0–46.0)
Hemoglobin: 7.6 g/dL — ABNORMAL LOW (ref 12.0–15.0)
MCH: 27.1 pg (ref 26.0–34.0)
MCHC: 30.5 g/dL (ref 30.0–36.0)
MCV: 88.9 fL (ref 80.0–100.0)
Platelets: 240 10*3/uL (ref 150–400)
RBC: 2.8 MIL/uL — ABNORMAL LOW (ref 3.87–5.11)
RDW: 14.2 % (ref 11.5–15.5)
WBC: 4.3 10*3/uL (ref 4.0–10.5)
nRBC: 0 % (ref 0.0–0.2)

## 2019-10-11 LAB — BASIC METABOLIC PANEL
Anion gap: 9 (ref 5–15)
BUN: 22 mg/dL — ABNORMAL HIGH (ref 6–20)
CO2: 30 mmol/L (ref 22–32)
Calcium: 8.4 mg/dL — ABNORMAL LOW (ref 8.9–10.3)
Chloride: 100 mmol/L (ref 98–111)
Creatinine, Ser: 1.86 mg/dL — ABNORMAL HIGH (ref 0.44–1.00)
GFR calc Af Amer: 37 mL/min — ABNORMAL LOW (ref >60)
GFR calc non Af Amer: 32 mL/min — ABNORMAL LOW (ref >60)
Glucose, Bld: 172 mg/dL — ABNORMAL HIGH (ref 70–99)
Potassium: 5.3 mmol/L — ABNORMAL HIGH (ref 3.5–5.1)
Sodium: 139 mmol/L (ref 135–145)

## 2019-10-11 LAB — GLUCOSE, CAPILLARY
Glucose-Capillary: 173 mg/dL — ABNORMAL HIGH (ref 70–99)
Glucose-Capillary: 166 mg/dL — ABNORMAL HIGH (ref 70–99)
Glucose-Capillary: 233 mg/dL — ABNORMAL HIGH (ref 70–99)
Glucose-Capillary: 292 mg/dL — ABNORMAL HIGH (ref 70–99)
Glucose-Capillary: 258 mg/dL — ABNORMAL HIGH (ref 70–99)

## 2019-10-11 LAB — AEROBIC/ANAEROBIC CULTURE W GRAM STAIN (SURGICAL/DEEP WOUND)

## 2019-10-11 SURGERY — APPLICATION, WOUND VAC
Anesthesia: General | Laterality: Right

## 2019-10-11 MED ORDER — ONDANSETRON HCL 4 MG/2ML IJ SOLN
INTRAMUSCULAR | Status: AC
  Filled 2019-10-11: qty 2

## 2019-10-11 MED ORDER — MIDAZOLAM HCL 5 MG/5ML IJ SOLN
INTRAMUSCULAR | Status: DC | PRN
  Administered 2019-10-11: 2 mg via INTRAVENOUS

## 2019-10-11 MED ORDER — HYDROMORPHONE HCL 1 MG/ML IJ SOLN
INTRAMUSCULAR | Status: AC
  Filled 2019-10-11: qty 1

## 2019-10-11 MED ORDER — LIDOCAINE 2% (20 MG/ML) 5 ML SYRINGE
INTRAMUSCULAR | Status: AC
  Filled 2019-10-11: qty 5

## 2019-10-11 MED ORDER — CEFAZOLIN SODIUM-DEXTROSE 1-4 GM/50ML-% IV SOLN
INTRAVENOUS | Status: AC
  Filled 2019-10-11: qty 50

## 2019-10-11 MED ORDER — PROPOFOL 10 MG/ML IV BOLUS
INTRAVENOUS | Status: AC
  Filled 2019-10-11: qty 20

## 2019-10-11 MED ORDER — HYDROMORPHONE HCL 1 MG/ML IJ SOLN
0.2500 mg | INTRAMUSCULAR | Status: DC | PRN
  Administered 2019-10-11 (×2): 0.5 mg via INTRAVENOUS

## 2019-10-11 MED ORDER — FENTANYL CITRATE (PF) 250 MCG/5ML IJ SOLN
INTRAMUSCULAR | Status: AC
  Filled 2019-10-11: qty 5

## 2019-10-11 MED ORDER — FENTANYL CITRATE (PF) 100 MCG/2ML IJ SOLN
INTRAMUSCULAR | Status: DC | PRN
  Administered 2019-10-11: 50 ug via INTRAVENOUS

## 2019-10-11 MED ORDER — ONDANSETRON HCL 4 MG/2ML IJ SOLN
INTRAMUSCULAR | Status: DC | PRN
  Administered 2019-10-11: 4 mg via INTRAVENOUS

## 2019-10-11 MED ORDER — CEFAZOLIN SODIUM-DEXTROSE 1-4 GM/50ML-% IV SOLN
INTRAVENOUS | Status: DC | PRN
  Administered 2019-10-11: 1 g via INTRAVENOUS

## 2019-10-11 MED ORDER — HYDROMORPHONE HCL 1 MG/ML IJ SOLN
0.5000 mg | Freq: Once | INTRAMUSCULAR | Status: AC
  Administered 2019-10-11: 0.5 mg via INTRAVENOUS
  Filled 2019-10-11: qty 1

## 2019-10-11 MED ORDER — MIDAZOLAM HCL 2 MG/2ML IJ SOLN
INTRAMUSCULAR | Status: AC
  Filled 2019-10-11: qty 2

## 2019-10-11 MED ORDER — INSULIN ASPART 100 UNIT/ML ~~LOC~~ SOLN
7.0000 [IU] | Freq: Three times a day (TID) | SUBCUTANEOUS | Status: DC
  Administered 2019-10-11 – 2019-10-12 (×4): 7 [IU] via SUBCUTANEOUS

## 2019-10-11 MED ORDER — DEXAMETHASONE SODIUM PHOSPHATE 10 MG/ML IJ SOLN
INTRAMUSCULAR | Status: AC
  Filled 2019-10-11: qty 1

## 2019-10-11 MED ORDER — LIDOCAINE HCL (CARDIAC) PF 100 MG/5ML IV SOSY
PREFILLED_SYRINGE | INTRAVENOUS | Status: DC | PRN
  Administered 2019-10-11: 60 mg via INTRAVENOUS

## 2019-10-11 MED ORDER — INSULIN ASPART 100 UNIT/ML ~~LOC~~ SOLN
0.0000 [IU] | Freq: Every day | SUBCUTANEOUS | Status: DC
  Administered 2019-10-11: 3 [IU] via SUBCUTANEOUS

## 2019-10-11 MED ORDER — DEXAMETHASONE SODIUM PHOSPHATE 4 MG/ML IJ SOLN
INTRAMUSCULAR | Status: DC | PRN
  Administered 2019-10-11: 4 mg via INTRAVENOUS

## 2019-10-11 MED ORDER — INSULIN ASPART 100 UNIT/ML ~~LOC~~ SOLN
0.0000 [IU] | SUBCUTANEOUS | Status: DC
  Administered 2019-10-11: 4 [IU] via SUBCUTANEOUS

## 2019-10-11 MED ORDER — INSULIN ASPART 100 UNIT/ML ~~LOC~~ SOLN: 0.0000 [IU] | Freq: Three times a day (TID) | SUBCUTANEOUS | Status: DC

## 2019-10-11 MED ORDER — 0.9 % SODIUM CHLORIDE (POUR BTL) OPTIME
TOPICAL | Status: DC | PRN
  Administered 2019-10-11: 1000 mL

## 2019-10-11 MED ORDER — GLYCERIN (LAXATIVE) 2.1 G RE SUPP
1.0000 | Freq: Once | RECTAL | Status: AC
  Administered 2019-10-11: 1 via RECTAL
  Filled 2019-10-11: qty 1

## 2019-10-11 MED ORDER — LACTATED RINGERS IV SOLN
INTRAVENOUS | Status: DC | PRN
  Administered 2019-10-11: 07:00:00 via INTRAVENOUS

## 2019-10-11 MED ORDER — PROPOFOL 10 MG/ML IV BOLUS
INTRAVENOUS | Status: DC | PRN
  Administered 2019-10-11: 120 mg via INTRAVENOUS

## 2019-10-11 MED ORDER — INSULIN ASPART 100 UNIT/ML ~~LOC~~ SOLN
0.0000 [IU] | Freq: Three times a day (TID) | SUBCUTANEOUS | Status: DC
  Administered 2019-10-11: 11 [IU] via SUBCUTANEOUS
  Administered 2019-10-12: 06:00:00 7 [IU] via SUBCUTANEOUS

## 2019-10-11 SURGICAL SUPPLY — 41 items
BNDG ELASTIC 4X5.8 VLCR STR LF (GAUZE/BANDAGES/DRESSINGS) ×2 IMPLANT
BNDG GAUZE ELAST 4 BULKY (GAUZE/BANDAGES/DRESSINGS) IMPLANT
CANISTER SUCT 3000ML PPV (MISCELLANEOUS) ×3 IMPLANT
CANISTER WOUND CARE 500ML ATS (WOUND CARE) ×5 IMPLANT
COVER SURGICAL LIGHT HANDLE (MISCELLANEOUS) ×3 IMPLANT
COVER WAND RF STERILE (DRAPES) ×1 IMPLANT
DRAPE HALF SHEET 40X57 (DRAPES) IMPLANT
DRAPE INCISE IOBAN 66X45 STRL (DRAPES) IMPLANT
DRAPE ORTHO SPLIT 77X108 STRL (DRAPES)
DRAPE SURG ORHT 6 SPLT 77X108 (DRAPES) IMPLANT
DRSG ADAPTIC 3X8 NADH LF (GAUZE/BANDAGES/DRESSINGS) ×2 IMPLANT
DRSG PAD ABDOMINAL 8X10 ST (GAUZE/BANDAGES/DRESSINGS) IMPLANT
DRSG VAC ATS LRG SENSATRAC (GAUZE/BANDAGES/DRESSINGS) IMPLANT
DRSG VAC ATS MED SENSATRAC (GAUZE/BANDAGES/DRESSINGS) IMPLANT
DRSG VAC ATS SM SENSATRAC (GAUZE/BANDAGES/DRESSINGS) ×2 IMPLANT
ELECT REM PT RETURN 9FT ADLT (ELECTROSURGICAL) ×3
ELECTRODE REM PT RTRN 9FT ADLT (ELECTROSURGICAL) ×1 IMPLANT
GAUZE SPONGE 4X4 12PLY STRL (GAUZE/BANDAGES/DRESSINGS) IMPLANT
GAUZE XEROFORM 5X9 LF (GAUZE/BANDAGES/DRESSINGS) ×3 IMPLANT
GLOVE BIO SURGEON STRL SZ 6.5 (GLOVE) ×6 IMPLANT
GLOVE BIO SURGEON STRL SZ7.5 (GLOVE) ×12 IMPLANT
GLOVE BIO SURGEONS STRL SZ 6.5 (GLOVE) ×3
GLOVE BIOGEL PI IND STRL 6.5 (GLOVE) ×1 IMPLANT
GLOVE BIOGEL PI IND STRL 7.5 (GLOVE) ×1 IMPLANT
GLOVE BIOGEL PI INDICATOR 6.5 (GLOVE) ×2
GLOVE BIOGEL PI INDICATOR 7.5 (GLOVE) ×2
GOWN STRL REUS W/ TWL LRG LVL3 (GOWN DISPOSABLE) ×2 IMPLANT
GOWN STRL REUS W/TWL LRG LVL3 (GOWN DISPOSABLE) ×4
KIT BASIN OR (CUSTOM PROCEDURE TRAY) ×3 IMPLANT
KIT TURNOVER KIT B (KITS) ×3 IMPLANT
NS IRRIG 1000ML POUR BTL (IV SOLUTION) ×3 IMPLANT
PACK ORTHO EXTREMITY (CUSTOM PROCEDURE TRAY) ×3 IMPLANT
PAD ARMBOARD 7.5X6 YLW CONV (MISCELLANEOUS) ×3 IMPLANT
PAD CAST 4YDX4 CTTN HI CHSV (CAST SUPPLIES) IMPLANT
PADDING CAST COTTON 4X4 STRL (CAST SUPPLIES) ×2
SUT ETHILON 2 0 PSLX (SUTURE) ×4 IMPLANT
TOWEL GREEN STERILE (TOWEL DISPOSABLE) ×3 IMPLANT
TOWEL GREEN STERILE FF (TOWEL DISPOSABLE) ×3 IMPLANT
TUBE CONNECTING 12'X1/4 (SUCTIONS) ×1
TUBE CONNECTING 12X1/4 (SUCTIONS) ×2 IMPLANT
YANKAUER SUCT BULB TIP NO VENT (SUCTIONS) ×3 IMPLANT

## 2019-10-11 NOTE — Addendum Note (Signed)
Addendum  created 10/11/19 0944 by Jenne Campus, CRNA   Intraprocedure LDAs edited, LDA properties accepted

## 2019-10-11 NOTE — Progress Notes (Addendum)
Was told in report that pt had very small BM following suppository- pt says she is unsure if she did have one. She does not complain of any abdominal discomfort or nausea. Will continue to monitor.

## 2019-10-11 NOTE — Transfer of Care (Signed)
Immediate Anesthesia Transfer of Care Note  Patient: Deanna Landry  Procedure(s) Performed: WOUND VAC CHANGE TO RIGHT LEG (Right )  Patient Location: PACU  Anesthesia Type:General  Level of Consciousness: awake, alert  and oriented  Airway & Oxygen Therapy: Patient Spontanous Breathing and Patient connected to nasal cannula oxygen  Post-op Assessment: Report given to RN, Post -op Vital signs reviewed and stable and Patient moving all extremities X 4  Post vital signs: Reviewed and stable  Last Vitals:  Vitals Value Taken Time  BP    Temp    Pulse 90 10/11/19 0828  Resp 16 10/11/19 0828  SpO2 99 % 10/11/19 0828  Vitals shown include unvalidated device data.  Last Pain:  Vitals:   10/11/19 0518  TempSrc: Oral  PainSc:       Patients Stated Pain Goal: 3 ([id number])  Complications: No apparent anesthesia complications

## 2019-10-11 NOTE — Progress Notes (Signed)
  Date: 10/11/2019  Patient name: Deanna Landry [place] record number: [id number]  Date of birth: [birth year]        I have seen and evaluated this patient and I have discussed the plan of care with the house staff. Please see their note for complete details. I concur with their findings.  Patient taken back to the OR today for secondary closure of right ankle wound per Dr. Doreatha Martin. Appreciate recommendations. Patient may weight bear as tolerated, ok to D/C from ortho standpoint. Will need wound VAC removed and sterile dressing placed prior to discharge. PT is not recommending any follow up. Today is day 7 of IV ancef, her surgical cultures grew MSSA. Can likely d/c antibiotics after today and monitor.   Of note, patient reports she has not had a BM since admission. She has been taken to the OR three times now this admission for washouts and debridement and has been receiving opioids. Likely has a post operative ileus. She is tolerating PO without N/V but is fairly distended on exam. Agree with scheduling bowel regimen. Would prefer patient have a BM prior to discharge.   Velna Ochs, MD 10/11/2019, 2:47 PM

## 2019-10-11 NOTE — Progress Notes (Signed)
Inpatient Diabetes Program Recommendations  AACE/ADA: New Consensus Statement on Inpatient Glycemic Control (2015)  Target Ranges:  Prepandial:   less than 140 mg/dL      Peak postprandial:   less than 180 mg/dL (1-2 hours)      Critically ill patients:  140 - 180 mg/dL   Lab Results  Component Value Date   GLUCAP 173 (H) 10/11/2019   HGBA1C 7.7 (H) 10/05/2019    Review of Glycemic Control Results for Deanna Landry, Deanna Landry (MRN [id number]) as of 10/11/2019 07:54  Ref. Range 10/10/2019 06:24 10/10/2019 13:00 10/10/2019 16:17 10/10/2019 21:29 10/11/2019 06:19  Glucose-Capillary Latest Ref Range: 70 - 99 mg/dL 111 (H) 153 (H) 174 (H) 138 (H) 173 (H)   Diabetes history:Type 1 DM Outpatient Diabetes medications:Levemir 36 units BID, Novolog 10 units TID, Trulicity 1.5 mg Qwk Current orders for Inpatient glycemic control: Levemir 25 units bid, Novolog resistant tid with meals, NPO for surgery this am  Inpatient Diabetes Program Recommendations: Noted patient for surgery this am. Post-op,please reduce Novolog correction to sensitive due to history of Type 1 DM.   Thank you, [name]. Tamir Wallman, RN, MSN, CDE  Diabetes Coordinator Inpatient Glycemic Control Team Team Pager [phone] (8am-5pm) 10/11/2019 7:56 AM

## 2019-10-11 NOTE — Anesthesia Procedure Notes (Signed)
Procedure Name: LMA Insertion Date/Time: 10/11/2019 7:47 AM Performed by: Jenne Campus, CRNA Pre-anesthesia Checklist: Patient identified, Emergency Drugs available, Suction available and Patient being monitored Patient Re-evaluated:Patient Re-evaluated prior to induction Oxygen Delivery Method: Circle System Utilized Preoxygenation: Pre-oxygenation with 100% oxygen Induction Type: IV induction Ventilation: Mask ventilation without difficulty LMA: LMA inserted LMA Size: 4.0 Number of attempts: 1 Placement Confirmation: positive ETCO2 and breath sounds checked- equal and bilateral Tube secured with: Tape Dental Injury: Teeth and Oropharynx as per pre-operative assessment

## 2019-10-11 NOTE — Op Note (Signed)
Orthopaedic Surgery Operative Note (CSN: [id number] ) Date of Surgery: 10/11/2019  Admit Date: 10/03/2019   Diagnoses: Pre-Op Diagnoses: Right trimalleolar ankle fracture Right ankle postoperative infection  Post-Op Diagnosis: Same  Procedures: CPT 13160-Secondary closure of right ankle wound  Surgeons : Primary: Shona Needles, MD  Assistant: Patrecia Pace, PA-C  Location: OR 6   Anesthesia:General  Antibiotics: Ancef 1g preop   Tourniquet time:None   Estimated Blood Loss:Minimal  Complications:None   Specimens:None   Implants: * No implants in log *   Indications for Surgery: 48 year old female that underwent ORIF for right trimalleolar ankle fracture in September 2020.  She sustained a postoperative infection with MSSA.  I took her for 2 irrigation debridements with wound VAC changes.  She has been on Ancef for her MSSA infection.  She presents for possible closure versus wound VAC change.  Risks and benefits were discussed with the patient.  Risks include but not limited to bleeding, infection, inability to close the wound, need for further surgery, even the possibility amputation.  Patient agreed proceed with surgery and consent was obtained.  Operative Findings: Healthy appearing tissue without any signs of active infection.  Secondary closure of the wound and placement of incisional wound VAC.  Procedure: The patient was identified in the preoperative holding area. Consent was confirmed with the patient and their family and all questions were answered. The operative extremity was marked after confirmation with the patient. she was then brought back to the operating room by our anesthesia colleagues.  She was carefully transferred over to a radiolucent flat top table.  She was placed under general anesthetic. The operative extremity was then prepped and draped in usual sterile fashion. A preoperative timeout was performed to verify the patient, the procedure, and the  extremity. Preoperative antibiotics were dosed.  The wound had no visible signs of infection.  Portion of the wound had already been closed at last visit.  I irrigated the wound with normal saline.  I used a Cobb elevator to debride some of the edges of the wound.  They were bleeding well with beefy appearing tissue.  I then used 2-0 nylon suture to approximate the skin edges.  I was able to close the wound without undue tension on the skin.  I then placed an incisional wound VAC using Adaptic and a black granular foam sponge.  Sterile wrap was placed through the right lower extremity.  The patient was awoken from anesthesia and taken the PACU in stable condition.  Post Op Plan/Instructions: Patient may weight-bear as tolerated on the right lower extremity.  Continue with the Ancef for the MSSA infection.  Patient may discharge from an orthopedic perspective.  I would recommend that she has her wound VAC removed prior to discharge and placement of a sterile dressing.  Continue DVT prophylaxis per primary team.  I was present and performed the entire surgery.  Patrecia Pace, PA-C did assist me throughout the case. An assistant was necessary given the difficulty in closure of the wound.   Katha Hamming, MD [facility]

## 2019-10-11 NOTE — Progress Notes (Signed)
   Subjective: HD#7  No acute overnight events reported.  [name] was examined at bedside this morning in the post op setting. She reports extreme pain in her right lower extremity following the procedure today.    Objective:  Vital signs in last 24 hours: Vitals:   10/10/19 0700 10/10/19 1152 10/10/19 2132 10/11/19 0518  BP: (!) 144/65 125/62 (!) 138/59 (!) 152/67  Pulse: 81 86 77 88  Resp: 20 18 16 16   Temp: 99.2 F (37.3 C) 98.2 F (36.8 C) 98.1 F (36.7 C) 98.4 F (36.9 C)  TempSrc: Oral Oral Oral Oral  SpO2: 100% 99% 99% 99%  Weight:      Height:       Physical Exam Constitutional:      General: She is not in acute distress.    Appearance: Normal appearance. She is not ill-appearing.  Cardiovascular:     Rate and Rhythm: Normal rate and regular rhythm.     Pulses: Normal pulses.     Heart sounds: Normal heart sounds.  Abdominal:     General: Bowel sounds are normal. There is distension.     Tenderness: There is abdominal tenderness. There is no guarding or rebound.     Comments: Abdomen distended with diffuse tenderness throughout; normoactive bowel sounds  Musculoskeletal:     Comments: RLE with wound vac in place and in [facility] boot  Neurological:     Mental Status: She is alert.     Assessment/Plan:  [name] is a 48 year old female with history of CHF, DMII, HTN, CKDIII, prior CVA with right lower extremity weakness, CAD, COPD and legal blindness s/p ORIF of right ankle in September presenting with a right ankle cellulitis for one week duration s/p I&D with wound vac placement.  Right ankle infection s/p I&D: Patient s/p secondary closure of right ankle wound. She tolerated it well and was evaluated this morning in the post op setting with [facility] boot in place. She remains afebrile and without leukocytosis. She is continued on IV Ancef during hospitalization. She has been afebrile and without leukocytosis. No antibiotics necessary on discharge. Wound  vac will be removed prior to discharge and sterile dressing will be placed.  Patient evaluated by PT/OT and recommended for home health w/ 24 hr supervision  - Continue IV Ancef Day 6 - Dilaudid 0.5mg  q6h prn - Oxycodone IR 5mg  q4h prn - CBC daily   Postoperative ileus:  Patient reports she has not had bowel movement since admission. She denies any nausea or vomiting and has been tolerating diet well. Abdomen appears to be distended and mildly tender to palpation diffusely. She has normoactive bowel sounds. She has miralax listed but has been taking it intermittently.  - Miralax daily scheduled - Glycerin suppository  - Fleet enema if patient is unable to have bowel movement   Hyperglycemia in setting of Type I DM/Type II DM: - Continue levemir 25U bid + Novolog 7U tid + SSI + qHS coverage  - CBG monitoring q3h  CKDIII: Patient baseline sCr 2.34. This AM, 1.86. Will continue to monitor.  - Avoid nephrotoxic agents - BMP daily   Hx of CVA with RLE weakness: - Continue atorvastatin 80mg  qd - Continue aspirin 81mg  qd and plavix 75mg  qd   Dispo: Anticipated discharge in 0-1 day(s).   Deanna Heck, MD  Internal Medicine, PGY-1 10/11/2019, 6:20 AM Pager: [phone]

## 2019-10-11 NOTE — Anesthesia Postprocedure Evaluation (Signed)
Anesthesia Post Note  Patient: Deanna Landry  Procedure(s) Performed: WOUND VAC CHANGE TO RIGHT LEG (Right )     Patient location during evaluation: PACU Anesthesia Type: General Level of consciousness: awake and alert Pain management: pain level controlled Vital Signs Assessment: post-procedure vital signs reviewed and stable Respiratory status: spontaneous breathing, nonlabored ventilation, respiratory function stable and patient connected to nasal cannula oxygen Cardiovascular status: blood pressure returned to baseline and stable Postop Assessment: no apparent nausea or vomiting Anesthetic complications: no    Last Vitals:  Vitals:   10/11/19 0900 10/11/19 0906  BP:    Pulse: 75 76  Resp: 12 13  Temp:  36.7 C  SpO2: 94% 95%    Last Pain:  Vitals:   10/11/19 0829  TempSrc:   PainSc: 9         RLE Motor Response: Purposeful movement (10/11/19 0906) RLE Sensation: Full sensation (10/11/19 0906)      Jefm Petty. EDMOND

## 2019-10-11 NOTE — Interval H&P Note (Signed)
History and Physical Interval Note:  10/11/2019 7:33 AM  Deanna Landry  has presented today for surgery, with the diagnosis of Right ankle infection.  The various methods of treatment have been discussed with the patient and family. After consideration of risks, benefits and other options for treatment, the patient has consented to  Procedure(s): WOUND VAC CHANGE TO RIGHT LEG (Right) as a surgical intervention.  The patient's history has been reviewed, patient examined, no change in status, stable for surgery.  I have reviewed the patient's chart and labs.  Questions were answered to the patient's satisfaction.     Lennette Bihari P Haddix

## 2019-10-12 ENCOUNTER — Encounter (HOSPITAL_COMMUNITY): Payer: Self-pay | Admitting: Student

## 2019-10-12 LAB — CBC
HCT: 23.8 % — ABNORMAL LOW (ref 36.0–46.0)
Hemoglobin: 7.3 g/dL — ABNORMAL LOW (ref 12.0–15.0)
MCH: 27.4 pg (ref 26.0–34.0)
MCHC: 30.7 g/dL (ref 30.0–36.0)
MCV: 89.5 fL (ref 80.0–100.0)
Platelets: 258 10*3/uL (ref 150–400)
RBC: 2.66 MIL/uL — ABNORMAL LOW (ref 3.87–5.11)
RDW: 14.1 % (ref 11.5–15.5)
WBC: 5 10*3/uL (ref 4.0–10.5)
nRBC: 0 % (ref 0.0–0.2)

## 2019-10-12 LAB — BASIC METABOLIC PANEL
Anion gap: 9 (ref 5–15)
BUN: 20 mg/dL (ref 6–20)
CO2: 32 mmol/L (ref 22–32)
Calcium: 8.3 mg/dL — ABNORMAL LOW (ref 8.9–10.3)
Chloride: 98 mmol/L (ref 98–111)
Creatinine, Ser: 1.76 mg/dL — ABNORMAL HIGH (ref 0.44–1.00)
GFR calc Af Amer: 39 mL/min — ABNORMAL LOW (ref >60)
GFR calc non Af Amer: 34 mL/min — ABNORMAL LOW (ref >60)
Glucose, Bld: 235 mg/dL — ABNORMAL HIGH (ref 70–99)
Potassium: 5.2 mmol/L — ABNORMAL HIGH (ref 3.5–5.1)
Sodium: 139 mmol/L (ref 135–145)

## 2019-10-12 LAB — GLUCOSE, CAPILLARY
Glucose-Capillary: 207 mg/dL — ABNORMAL HIGH (ref 70–99)
Glucose-Capillary: 112 mg/dL — ABNORMAL HIGH (ref 70–99)

## 2019-10-12 MED ORDER — OXYCODONE HCL 5 MG PO TABS: 5.0000 mg | ORAL_TABLET | ORAL | 0 refills | Status: AC | PRN

## 2019-10-12 MED ORDER — METHOCARBAMOL 500 MG PO TABS: 500.0000 mg | ORAL_TABLET | Freq: Four times a day (QID) | ORAL | 0 refills | Status: AC | PRN

## 2019-10-12 MED ORDER — SORBITOL 70 % SOLN
960.0000 mL | TOPICAL_OIL | Freq: Once | ORAL | Status: AC
  Administered 2019-10-12: 960 mL via RECTAL
  Filled 2019-10-12 (×2): qty 473

## 2019-10-12 MED ORDER — ACETAMINOPHEN 325 MG PO TABS: 325.0000 mg | ORAL_TABLET | Freq: Four times a day (QID) | ORAL | 0 refills | Status: AC | PRN

## 2019-10-12 NOTE — Progress Notes (Signed)
    Subjective: Moderate, controlled.  Verbalizes understanding of postoperative instructions and follow-up plan.  Wound VAC removed and sterile dressings: Adaptic, gauze, ABD, Kerlix, Ace wrap applied.  Objective:   VITALS:   Vitals:   10/11/19 1628 10/11/19 2001 10/12/19 0415 10/12/19 1136  BP:  137/63 139/63 (!) 139/59  Pulse:  77 88 84  Resp:  19 16 17   Temp: 97.8 F (36.6 C) 98.1 F (36.7 C) 98.4 F (36.9 C) 98.4 F (36.9 C)  TempSrc: Oral Oral Oral Oral  SpO2:  97% 95% 100%  Weight:      Height:       CBC Latest Ref Rng & Units 10/12/2019 10/11/2019 10/10/2019  WBC 4.0 - 10.5 K/uL 5.0 4.3 5.9  Hemoglobin 12.0 - 15.0 g/dL 7.3(L) 7.6(L) 7.9(L)  Hematocrit 36.0 - 46.0 % 23.8(L) 24.9(L) 25.8(L)  Platelets 150 - 400 K/uL 258 240 263   BMP Latest Ref Rng & Units 10/12/2019 10/11/2019 10/10/2019  Glucose 70 - 99 mg/dL 235(H) 172(H) 204(H)  BUN 6 - 20 mg/dL 20 22(H) 17  Creatinine 0.44 - 1.00 mg/dL 1.76(H) 1.86(H) 1.69(H)  Sodium 135 - 145 mmol/L 139 139 136  Potassium 3.5 - 5.1 mmol/L 5.2(H) 5.3(H) 5.3(H)  Chloride 98 - 111 mmol/L 98 100 99  CO2 22 - 32 mmol/L 32 30 30  Calcium 8.9 - 10.3 mg/dL 8.3(L) 8.4(L) 8.7(L)   Intake/Output      11/25 0701 - 11/26 0700 11/26 0701 - 11/27 0700   P.O.     I.V. (mL/kg) 2052.9 (24.9)    IV Piggyback 50 0   Total Intake(mL/kg) 2102.9 (25.6) 0 (0)   Urine (mL/kg/hr) 1550 (0.8) 900 (1.3)   Stool  0   Blood 10    Total Output 1560 900   Net +542.9 -900        Urine Occurrence 2 x    Stool Occurrence  1 x      Physical Exam: General: NAD.  Supine in bed.  Calm, conversant.  Nurse at bedside.  MSK RLE: Wound VAC in place and removed during visit. Incision approximated with nylon suture.  No purulence. Sterile dressings applied Tight heel cord-PRAFO instructions given. Sensation and motor function intact at baseline.   Assessment: 1 Day Post-Op  S/P Procedure(s) (LRB): WOUND VAC CHANGE TO RIGHT LEG (Right) by Dr.  Doreatha Martin 10/11/2019  Principal Problem:   Wound infection after surgery (Right ankle) Active Problems:   Type 1 diabetes mellitus without complication (HCC)   Diabetic polyneuropathy (HCC)   Hypertension   Hyperglycemia   Ileus, postoperative (HCC)   Right trimalleolar ankle fracture, postoperative infection -Status post multiple debridements, wound VAC placement, secondary closure  Doing well postop day 1 Pain controlled  Plan: Incision VAC removed-sterile dressings applied Incentive Spirometry Elevate, PRAFO Home today  Weightbearing: WBAT RLE Insicional and dressing care: Dressings left intact until follow-up.  Only change as needed.  Materials provided to patient. Orthopedic device(s): PRAFO boot Showering: Keep dressing dry VTE prophylaxis: Per primary team-[place] heparin and Plavix inpatient, SCDs, ambulation.  Will resume Plavix/81 mg aspirin. Follow - up plan: December 1 with orthopedic trauma services  Dispo: Home today   [place] III, PA-C 10/12/2019, 3:20 PM

## 2019-10-12 NOTE — Discharge Instructions (Signed)
Wound Infection A wound infection happens when germs start to grow in a wound. Germs that cause wound infections are most often bacteria. Other types of infections can occur as well. An infection can cause the wound to break open. Wound infections need treatment. If a wound infection is not treated, problems can happen. What are the causes?  Most often caused by germs (bacteria) that grow in a wound.  Other germs, such as yeast and funguses, can also cause wound infections. What increases the risk?  Having a weak body defense system (immune system).  Having diabetes.  Taking certain medicines (steroids) for a long time.  Smoking.  Being an older person.  Being overweight.  Taking certain medicines for cancer treatment. What are the signs or symptoms?  Having more redness, swelling, or pain at the wound site.  Having more blood or fluid at the wound site.  A bad smell coming from a wound or bandage (dressing).  Having a fever.  Feeling very tired.  Having warmth at or around the wound.  Having pus at the wound site. How is this treated?  This condition is most often treated with an antibiotic medicine. ? The infection should improve 24-48 hours after you start antibiotics. ? After 24-48 hours, redness around the wound should stop spreading. The wound should also be less painful. Follow these instructions at home: Medicines  Take or apply over-the-counter and prescription medicines only as told by your doctor.  If you were prescribed an antibiotic medicine, take or apply it as told by your doctor. Do not stop using the antibiotic even if you start to feel better. Wound care  Clean the wound each day, or as told by your doctor. ? Wash the wound with mild soap and water. ? Rinse the wound with water to remove all soap. ? Pat the wound dry with a clean towel. Do not rub it.  Follow instructions from your doctor about how to take care of your wound. Make sure  you: ? Wash your hands with soap and water before and after you change your bandage. If you cannot use soap and water, use hand sanitizer. ? Change your bandage as told by your doctor. ? Leave stitches (sutures), skin glue, or skin tape (adhesive) strips in place if your wound has been closed. They may need to stay in place for 2 weeks or longer. If tape strips get loose and curl up, you may trim the loose edges. Do not remove tape strips completely unless your doctor says it is okay. Some wounds are left open to heal on their own.  Check your wound every day for signs of infection. Watch for: ? More redness, swelling, or pain. ? More fluid or blood. ? Warmth. ? Pus or a bad smell. General instructions                                                              Keep the bandage dry until your doctor says it can be removed.  Do not take baths, swim, or use a hot tub until your doctor approves. Ask your doctor if you may take showers. You may only be allowed to take sponge baths.  Raise (elevate) the injured area above the level of your heart while you are sitting  or lying down.  Do not scratch or pick at the wound.  Keep all follow-up visits as told by your doctor. This is important. Contact a doctor if:  Medicine does not help your pain.  You have more redness, swelling, or pain around your wound.  You have more fluid or blood coming from your wound.  Your wound feels warm to the touch.  You have pus coming from your wound.  You notice a bad smell coming from your wound or your bandage.  Your wound that was closed breaks open. Get help right away if:  You have a red streak going away from your wound.  You have a fever. Summary  A wound infection happens when germs start to grow in a wound.  This condition is usually treated with an antibiotic medicine.  Follow instructions from your doctor about how to take care of your wound.  Contact a doctor if your wound  infection does not start to get better in 24-48 hours, or your symptoms get worse.  Keep all follow-up visits as told by your doctor. This is important. This information is not intended to replace advice given to you by your health care provider. Make sure you discuss any questions you have with your health care provider. Document Released: 08/11/2008 Document Revised: 06/14/2018 Document Reviewed: 06/14/2018 Elsevier Patient Education  2020 [employer].

## 2019-10-12 NOTE — Discharge Summary (Signed)
Name: Deanna Landry MRN: [id number] DOB: May 27, 1971 48 y.o. PCP: Cher Nakai, MD  Date of Admission: 10/03/2019  9:51 PM Date of Discharge:  Attending Physician: Velna Ochs, MD  Discharge Diagnosis: 1. Right ankle cellulitis s/p ORIF  2. Hyperglycemia/early DKA in Type I/Type II DM 3. Postoperative ileus   Discharge Medications: Allergies as of 10/12/2019   No Known Allergies     Medication List    TAKE these medications   Accu-Chek Aviva Plus test strip Generic drug: glucose blood USE TO CHECK BLOOD SUGAR 4 TIMES A DAY   acetaminophen 325 MG tablet Commonly known as: TYLENOL Take 1 tablet (325 mg total) by mouth every 6 (six) hours as needed for mild pain, moderate pain or fever.   amLODipine 10 MG tablet Commonly known as: NORVASC Take 10 mg by mouth daily.   aspirin EC 81 MG tablet Take 81 mg by mouth daily.   atorvastatin 80 MG tablet Commonly known as: LIPITOR Take 80 mg by mouth daily.   buPROPion 300 MG 24 hr tablet Commonly known as: WELLBUTRIN XL Take 300 mg by mouth daily.   clopidogrel 75 MG tablet Commonly known as: PLAVIX Take 75 mg by mouth daily.   isosorbide dinitrate 20 MG tablet Commonly known as: ISORDIL Take 20 mg by mouth 2 (two) times daily.   latanoprost 0.005 % ophthalmic solution Commonly known as: XALATAN Place 1 drop into the left eye every evening.   Levemir 100 UNIT/ML injection Generic drug: insulin detemir Inject 36 Units into the skin 2 (two) times daily.   levothyroxine 50 MCG tablet Commonly known as: SYNTHROID Take 50 mcg by mouth daily.   methocarbamol 500 MG tablet Commonly known as: ROBAXIN Take 1 tablet (500 mg total) by mouth every 6 (six) hours as needed for muscle spasms.   NovoLOG FlexPen 100 UNIT/ML FlexPen Generic drug: insulin aspart Inject 10 Units into the skin 3 (three) times daily.   ondansetron 4 MG tablet Commonly known as: ZOFRAN Take 1 tablet (4 mg total) by mouth every 6 (six) hours  as needed for nausea.   oxyCODONE 5 MG immediate release tablet Commonly known as: Oxy IR/ROXICODONE Take 1 tablet (5 mg total) by mouth every 4 (four) hours as needed for up to 7 days for severe pain.   pantoprazole 40 MG tablet Commonly known as: PROTONIX Take 40 mg by mouth daily.   pregabalin 75 MG capsule Commonly known as: LYRICA Take 75 mg by mouth 2 (two) times daily.   sertraline 100 MG tablet Commonly known as: ZOLOFT Take 100 mg by mouth daily.   Systane 0.4-0.3 % Gel ophthalmic gel Generic drug: Polyethyl Glycol-Propyl Glycol Place 1 application into both eyes 3 (three) times daily as needed (dry eye).   Trulicity 1.5 0000000 Sopn Generic drug: Dulaglutide Inject 1.5 mg into the skin every Monday.       Disposition and follow-up:   Ms.Deanna Landry was discharged from [facility] in Stable condition.  At the hospital follow up visit please address:  1.  Right ankle cellulitis s/p ORIF: Patient had right ankle cellulitis s/p recent ORIF. She underwent multiple I&D's with removal of hardware and completed 7 days of Ancef. On follow up, please ensure patient is weight bearing as tolerated, changing dressing as needed, and follows up with orthopedic surgeon.   Hyperglycemia/early DKA in setting of Type I and II DM: Patient presented with early DKA in setting of mixed DM requiring insulin gtt. She  was discharged home with her home regimen. Please adjust as needed.   Postoperative ileus: Patient with postoperative ileus resolved with scheduled Miralax and enema. Please ensure patient is having adequate bowel movements.   2.  Labs / imaging needed at time of follow-up: CBC  3.  Pending labs/ test needing follow-up: none   Follow-up Appointments: Follow-up Information    Cher Nakai, MD. Schedule an appointment as soon as possible for a visit in 1 week(s).   Specialty: Internal Medicine Contact information: [place]  [address] [phone]        Shona Needles, MD. Schedule an appointment as soon as possible for a visit in 2 week(s).   Specialty: Orthopedic Surgery Contact information: [place] [address] [place] [facility] Course by problem list: 1. Right ankle cellulitis s/p ORIF:  Patient presented with a right ankle wound after recent admission and surgical fixation of a closed right trimalleolar fracture. She presented after trauma to the surgical site while she was in the shower and hit her leg. Since then, she was caring for the wound at home with her mother's help. However, due to increasing pain and fevers up to 102F at home, patient presented to the ED. Orthopedics was consulted and patient underwent multiple surgical debridements with removal of her hardware. Patient started on vancomycin initially but transitioned to Ancef for MSSA and completed 7 day course of antibiotics during her stay. She remained afebrile and without leukocytosis throughout her admission. She was discharged home with home health PT/OT/RN. For her pain regimen, patient will take scheduled tylenol, robaxin and oxycodone IR 5mg  as needed. She is to follow up with orthopedic surgeon in one week.   2. Hyperglycemia/early DKA in setting of mixed Type I and II DM:  Patient with history of Type I DM presented with signs of early DKA requiring insulin gtt. This is likely in setting of her infection which was likely contributing to her hyperglycemia. She was transitioned levemir 25U bid with meal time short-acting insulin and resistant SSI with meals and night time coverage. She was discharged home to continue her home dose insulin and instructed to follow up with her PCP for insulin adjustment and possible endocrinology referral.  3. Postoperative ileus Patient with abdominal distension and mild diffuse tenderness to palpation following her multiple debridements. She was on scheduled Miralax;  however, due to NPO status prior to surgeries and patient refusal, had been intermittently receiving it. Her ileus resolved with Miralax and enema. Patient discharged to home after bowel movement and instructed to continue taking Miralax.    Discharge Vitals:   BP (!) 139/59 (BP Location: Right Arm)   Pulse 84   Temp 98.4 F (36.9 C) (Oral)   Resp 17   Ht 5\' 2"  (1.575 m)   Wt 82.3 kg   LMP  (LMP Unknown) Comment: hysterectomy 2014  SpO2 100%   BMI 33.19 kg/m   Pertinent Labs, Studies, and Procedures:  CBC Latest Ref Rng & Units 10/12/2019 10/11/2019 10/10/2019  WBC 4.0 - 10.5 K/uL 5.0 4.3 5.9  Hemoglobin 12.0 - 15.0 g/dL 7.3(L) 7.6(L) 7.9(L)  Hematocrit 36.0 - 46.0 % 23.8(L) 24.9(L) 25.8(L)  Platelets 150 - 400 K/uL 258 240 263   BMP Latest Ref Rng & Units 10/12/2019 10/11/2019 10/10/2019  Glucose 70 - 99 mg/dL 235(H) 172(H) 204(H)  BUN 6 - 20 mg/dL 20 22(H) 17  Creatinine  0.44 - 1.00 mg/dL 1.76(H) 1.86(H) 1.69(H)  Sodium 135 - 145 mmol/L 139 139 136  Potassium 3.5 - 5.1 mmol/L 5.2(H) 5.3(H) 5.3(H)  Chloride 98 - 111 mmol/L 98 100 99  CO2 22 - 32 mmol/L 32 30 30  Calcium 8.9 - 10.3 mg/dL 8.3(L) 8.4(L) 8.7(L)   X-RAY RIGHT ANKLE 10/03/2019: IMPRESSION: Status post internal fixation of distal fibular and tibial fractures with intact hardware and no gross osseous destructive change. Hardware appears grossly intact. Some interval fracture healing is noted.  CT ABDOMEN PELVIS WO CONTRAST 10/04/2019:  IMPRESSION: 1. No evident bowel obstruction. No appreciable diverticulitis. No bowel wall thickening. No abscess in the abdomen or pelvis. Appendix appears normal. 2. Extensive multifocal mesenteric and pelvic arterial vascular calcification, likely a consequence of diabetes mellitus. 3. Prominent spleen. Tiny calcifications likely represents small splenic granulomas. 4. No evident renal or ureteral calculus. No hydronephrosis. Urinary bladder wall thickness is normal. Urinary  bladder is somewhat distended currently. 5. Question a degree of sludge in the gallbladder. No gallstones or gallbladder wall thickening evident by CT. 6.  Uterus absent.  X-RAY RIGHT ANKLE 10/06/2019:  IMPRESSION: Removal of lateral plate and screws from the distal fibula and tibia-fibular syndesmosis.   Discharge Instructions: Discharge Instructions    Call MD for:  difficulty breathing, headache or visual disturbances   Complete by: As directed    Call MD for:  extreme fatigue   Complete by: As directed    Call MD for:  persistant dizziness or light-headedness   Complete by: As directed    Call MD for:  redness, tenderness, or signs of infection (pain, swelling, redness, odor or green/yellow discharge around incision site)   Complete by: As directed    Call MD for:  severe uncontrolled pain   Complete by: As directed    Call MD for:  temperature >100.4   Complete by: As directed    Diet - low sodium heart healthy   Complete by: As directed    Discharge instructions   Complete by: As directed    Ms. Nori Riis,  You were admitted for a right ankle infection after your recent surgery. You were on given antibiotics and underwent multiple surgeries for removal of hardware and debridement of the infection. You may bear weight on your right side as tolerated. You are discharged home with pain medication. Please follow up with your PCP for hospital follow up and also for diabetes management.   Thank you!   Increase activity slowly   Complete by: As directed       Signed: Harvie Heck, MD  Internal Medicine, PGY-1 10/12/2019, 12:10 PM   Pager: [phone]

## 2019-10-12 NOTE — Progress Notes (Signed)
Subjective: HD#8  No acute overnight events reported. Per nursing note, patient had small bowel movement following the suppository overnight.  [name] was examined at bedside this morning. She reports pain in her right lower extremity, especially on movement. We discussed that some postoperative pain is expected but recommended against extensive opioid use in setting of her postoperative ileus. Patient expresses understanding.    Objective:  Vital signs in last 24 hours: Vitals:   10/11/19 1619 10/11/19 1628 10/11/19 2001 10/12/19 0415  BP:   137/63 139/63  Pulse: 80  77 88  Resp:   19 16  Temp:  97.8 F (36.6 C) 98.1 F (36.7 C) 98.4 F (36.9 C)  TempSrc:  Oral Oral Oral  SpO2: 94%  97% 95%  Weight:      Height:       Physical Exam Constitutional:      General: She is not in acute distress.    Appearance: Normal appearance. She is not ill-appearing.  Cardiovascular:     Rate and Rhythm: Normal rate and regular rhythm.     Pulses: Normal pulses.     Heart sounds: Normal heart sounds.  Abdominal:     General: Bowel sounds are normal. There is distension.     Tenderness: There is no abdominal tenderness. There is no guarding or rebound.  Musculoskeletal:     Comments: RLE with wound vac in place and in [facility] boot  Skin:    General: Skin is warm and dry.     Capillary Refill: Capillary refill takes less than 2 seconds.  Neurological:     Mental Status: She is alert and oriented to person, place, and time.     Assessment/Plan:  [name] is a 48 year old female with history of CHF, DMII, HTN, CKDIII, prior CVA with right lower extremity weakness, CAD, COPD and legal blindness s/p ORIF of right ankle in September presenting with a right ankle cellulitis for one week duration s/p I&D with wound vac placement.  Right ankle infection s/p I&D: Patient s/p secondary closure of right ankle wound yesterday. She remains afebrile and without leukocytosis. She received  7 days of antibiotics during her hospitalization for MSSA wound culture. She has been afebrile and without leukocytosis. No antibiotics necessary on discharge. Wound vac will be removed prior to discharge and sterile dressing will be placed.  Patient to be discharged home with home health PT/OT/RN. - Acetaminophen 650mg  q6h + oxycodone IR 5mg  q4h prn + Dilaudid 0.5mg  q6h prn - Discharge to home with PCP and orthopedics follow up   Postoperative ileus:  Yesterday, patient reported not having any bowel movements since admission. She denies any nausea/vomiting and tolerated diet well. Her abdomen appeared to be distended and mildly tender to palpation diffusely with normoactive bowel sounds. She was given Miralax scheduled and suppository. Per nursing note, she had a very small bowel movement over night but patient reports that she did not have any bowel movement. Abdomen is still distended but improved from prior. Bowel sounds normoactive.  - Miralax daily scheduled + SMOG enema  Hyperglycemia in setting of Type I DM/Type II DM: - Continue levemir 25U bid + Novolog 7U tid + SSI + qHS coverage  - CBG monitoring q3h  CKDIII: Patient baseline sCr 2.34. This AM, 1.76. Will continue to monitor.  - Avoid nephrotoxic agents - BMP daily   Hx of CVA with RLE weakness: - Continue atorvastatin 80mg  qd - Continue aspirin 81mg  qd and  plavix 75mg  qd   Dispo: Anticipated discharge in 0-1 day(s).   Harvie Heck, MD  Internal Medicine, PGY-1 10/12/2019, 7:04 AM Pager: [phone]

## 2019-10-15 ENCOUNTER — Inpatient Hospital Stay (HOSPITAL_COMMUNITY)
Admission: EM | Admit: 2019-10-15 | Payer: Medicare HMO | Source: Other Acute Inpatient Hospital | Admitting: Internal Medicine

## 2019-10-15 DIAGNOSIS — I2510 Atherosclerotic heart disease of native coronary artery without angina pectoris: Secondary | ICD-10-CM | POA: Diagnosis not present

## 2019-10-15 DIAGNOSIS — J9622 Acute and chronic respiratory failure with hypercapnia: Secondary | ICD-10-CM | POA: Diagnosis not present

## 2019-10-15 DIAGNOSIS — E1010 Type 1 diabetes mellitus with ketoacidosis without coma: Secondary | ICD-10-CM | POA: Diagnosis not present

## 2019-10-15 DIAGNOSIS — G9341 Metabolic encephalopathy: Secondary | ICD-10-CM | POA: Diagnosis not present

## 2019-10-15 DIAGNOSIS — J9600 Acute respiratory failure, unspecified whether with hypoxia or hypercapnia: Secondary | ICD-10-CM | POA: Diagnosis not present

## 2019-10-15 DIAGNOSIS — R6521 Severe sepsis with septic shock: Secondary | ICD-10-CM | POA: Diagnosis not present

## 2019-10-15 DIAGNOSIS — J9601 Acute respiratory failure with hypoxia: Secondary | ICD-10-CM | POA: Diagnosis not present

## 2019-10-15 DIAGNOSIS — R9431 Abnormal electrocardiogram [ECG] [EKG]: Secondary | ICD-10-CM | POA: Diagnosis not present

## 2019-10-15 DIAGNOSIS — J189 Pneumonia, unspecified organism: Secondary | ICD-10-CM | POA: Diagnosis not present

## 2019-10-15 DIAGNOSIS — T68XXXA Hypothermia, initial encounter: Secondary | ICD-10-CM | POA: Diagnosis not present

## 2019-10-15 DIAGNOSIS — I129 Hypertensive chronic kidney disease with stage 1 through stage 4 chronic kidney disease, or unspecified chronic kidney disease: Secondary | ICD-10-CM | POA: Diagnosis not present

## 2019-10-15 DIAGNOSIS — S82201A Unspecified fracture of shaft of right tibia, initial encounter for closed fracture: Secondary | ICD-10-CM | POA: Diagnosis not present

## 2019-10-15 DIAGNOSIS — N179 Acute kidney failure, unspecified: Secondary | ICD-10-CM | POA: Diagnosis not present

## 2019-10-15 DIAGNOSIS — J9621 Acute and chronic respiratory failure with hypoxia: Secondary | ICD-10-CM | POA: Diagnosis not present

## 2019-10-15 DIAGNOSIS — A419 Sepsis, unspecified organism: Secondary | ICD-10-CM | POA: Diagnosis not present

## 2019-10-15 DIAGNOSIS — R4182 Altered mental status, unspecified: Secondary | ICD-10-CM | POA: Diagnosis not present

## 2019-10-15 DIAGNOSIS — E875 Hyperkalemia: Secondary | ICD-10-CM | POA: Diagnosis not present

## 2019-10-15 DIAGNOSIS — I517 Cardiomegaly: Secondary | ICD-10-CM | POA: Diagnosis not present

## 2019-10-15 DIAGNOSIS — J9691 Respiratory failure, unspecified with hypoxia: Secondary | ICD-10-CM | POA: Diagnosis not present

## 2019-10-15 DIAGNOSIS — M869 Osteomyelitis, unspecified: Secondary | ICD-10-CM | POA: Diagnosis not present

## 2019-10-15 DIAGNOSIS — E1165 Type 2 diabetes mellitus with hyperglycemia: Secondary | ICD-10-CM | POA: Diagnosis not present

## 2019-10-15 DIAGNOSIS — R404 Transient alteration of awareness: Secondary | ICD-10-CM | POA: Diagnosis not present

## 2019-10-15 DIAGNOSIS — D649 Anemia, unspecified: Secondary | ICD-10-CM | POA: Diagnosis not present

## 2019-10-15 DIAGNOSIS — R778 Other specified abnormalities of plasma proteins: Secondary | ICD-10-CM | POA: Diagnosis not present

## 2019-10-15 DIAGNOSIS — K529 Noninfective gastroenteritis and colitis, unspecified: Secondary | ICD-10-CM | POA: Diagnosis not present

## 2019-10-15 DIAGNOSIS — I214 Non-ST elevation (NSTEMI) myocardial infarction: Secondary | ICD-10-CM | POA: Diagnosis not present

## 2019-10-15 DIAGNOSIS — G9340 Encephalopathy, unspecified: Secondary | ICD-10-CM | POA: Diagnosis not present

## 2019-10-15 DIAGNOSIS — R069 Unspecified abnormalities of breathing: Secondary | ICD-10-CM | POA: Diagnosis not present

## 2019-10-15 DIAGNOSIS — S91001A Unspecified open wound, right ankle, initial encounter: Secondary | ICD-10-CM | POA: Diagnosis not present

## 2019-10-15 DIAGNOSIS — S82401A Unspecified fracture of shaft of right fibula, initial encounter for closed fracture: Secondary | ICD-10-CM | POA: Diagnosis not present

## 2019-10-15 DIAGNOSIS — Z4682 Encounter for fitting and adjustment of non-vascular catheter: Secondary | ICD-10-CM | POA: Diagnosis not present

## 2019-10-15 DIAGNOSIS — J181 Lobar pneumonia, unspecified organism: Secondary | ICD-10-CM | POA: Diagnosis not present

## 2019-10-15 DIAGNOSIS — Z9989 Dependence on other enabling machines and devices: Secondary | ICD-10-CM | POA: Diagnosis not present

## 2019-10-15 DIAGNOSIS — E1110 Type 2 diabetes mellitus with ketoacidosis without coma: Secondary | ICD-10-CM | POA: Diagnosis not present

## 2019-10-15 DIAGNOSIS — R1310 Dysphagia, unspecified: Secondary | ICD-10-CM | POA: Diagnosis not present

## 2019-10-15 DIAGNOSIS — E039 Hypothyroidism, unspecified: Secondary | ICD-10-CM | POA: Diagnosis not present

## 2019-10-15 DIAGNOSIS — Z794 Long term (current) use of insulin: Secondary | ICD-10-CM | POA: Diagnosis not present

## 2019-10-15 DIAGNOSIS — E10319 Type 1 diabetes mellitus with unspecified diabetic retinopathy without macular edema: Secondary | ICD-10-CM | POA: Diagnosis not present

## 2019-10-15 DIAGNOSIS — H5702 Anisocoria: Secondary | ICD-10-CM | POA: Diagnosis not present

## 2019-10-15 DIAGNOSIS — I5042 Chronic combined systolic (congestive) and diastolic (congestive) heart failure: Secondary | ICD-10-CM | POA: Diagnosis not present

## 2019-10-15 DIAGNOSIS — N183 Chronic kidney disease, stage 3 unspecified: Secondary | ICD-10-CM | POA: Diagnosis not present

## 2019-10-16 ENCOUNTER — Other Ambulatory Visit: Payer: Self-pay

## 2019-10-16 NOTE — Patient Outreach (Signed)
[facility]) Care Management  10/16/2019  Deanna Landry 20-Sep-1971 [id number]     Transition of Care Referral  Referral Date: 10/16/2019  Referral Source: [employer] Discharge Report Date of Admission: 10/04/2019 Diagnosis: "wound infection" Date of Discharge: 10/12/2019 Facility: [place] [facility]    Outreach attempt # 1 to patient. Phone went straight to voicemail. RN CM left HIPAA compliant voicemail message along with contact info.    Plan: RN CM will make outreach attempt to patient within 3-4 business days.   Enzo Montgomery, RN,BSN,CCM [place] Management Telephonic Care Management Coordinator Direct Phone: [phone] Toll Free: [phone] Fax: [phone]

## 2019-10-17 DIAGNOSIS — J189 Pneumonia, unspecified organism: Secondary | ICD-10-CM

## 2019-10-17 HISTORY — DX: Pneumonia, unspecified organism: J18.9

## 2019-10-18 ENCOUNTER — Other Ambulatory Visit: Payer: Self-pay

## 2019-10-18 NOTE — Patient Outreach (Signed)
[facility]) Care Management  10/18/2019  Deanna Landry Apr 12, 1971 [id number]   Transition of Care Referral  Referral Date: 10/16/2019  Referral Source: [employer] Discharge Report Date of Admission: 10/04/2019 Diagnosis: "wound infection" Date of Discharge: 10/12/2019 Facility: [place] [facility]    Outreach attempt #2 to patient. No answer at present after multiple rings. Mailbox full and unable to leave message.     Plan: RN CM will send unsuccessful outreach letter to patient. RN CM will make outreach attempt to patient within 3-4 business days.   Enzo Montgomery, RN,BSN,CCM [place] Management Telephonic Care Management Coordinator Direct Phone: [phone] Toll Free: [phone] Fax: [phone]

## 2019-10-20 ENCOUNTER — Other Ambulatory Visit: Payer: Self-pay

## 2019-10-20 NOTE — Patient Outreach (Signed)
[facility]) Care Management  10/20/2019  Deanna Landry 08-Jun-1971 [id number]   Transition of Care Referral  Referral Date:10/16/2019 Referral Source:[employer] Discharge Report Date of Admission:10/04/2019 Diagnosis:"wound infection" Date of Discharge:10/12/2019 Facility:[facility]   Outreach attempt #3 to patient. No answer at present after several rings and mailbox full.     Plan: RN CM will make outreach attempt to patient within the month of January.    Enzo Montgomery, RN,BSN,CCM [place] Management Telephonic Care Management Coordinator Direct Phone: [phone] Toll Free: [phone] Fax: [phone]

## 2019-10-23 HISTORY — PX: CORONARY ANGIOPLASTY: SHX604

## 2019-10-24 ENCOUNTER — Ambulatory Visit: Payer: Self-pay

## 2019-10-26 DIAGNOSIS — Z7901 Long term (current) use of anticoagulants: Secondary | ICD-10-CM | POA: Diagnosis not present

## 2019-10-26 DIAGNOSIS — M6281 Muscle weakness (generalized): Secondary | ICD-10-CM | POA: Diagnosis not present

## 2019-10-26 DIAGNOSIS — H548 Legal blindness, as defined in USA: Secondary | ICD-10-CM | POA: Diagnosis not present

## 2019-10-26 DIAGNOSIS — S82871D Displaced pilon fracture of right tibia, subsequent encounter for closed fracture with routine healing: Secondary | ICD-10-CM | POA: Diagnosis not present

## 2019-10-26 DIAGNOSIS — Z794 Long term (current) use of insulin: Secondary | ICD-10-CM | POA: Diagnosis not present

## 2019-10-26 DIAGNOSIS — S82851D Displaced trimalleolar fracture of right lower leg, subsequent encounter for closed fracture with routine healing: Secondary | ICD-10-CM | POA: Diagnosis not present

## 2019-10-26 DIAGNOSIS — I2510 Atherosclerotic heart disease of native coronary artery without angina pectoris: Secondary | ICD-10-CM | POA: Diagnosis not present

## 2019-10-26 DIAGNOSIS — Z955 Presence of coronary angioplasty implant and graft: Secondary | ICD-10-CM | POA: Diagnosis not present

## 2019-10-26 DIAGNOSIS — R040 Epistaxis: Secondary | ICD-10-CM | POA: Diagnosis not present

## 2019-10-26 DIAGNOSIS — Z7902 Long term (current) use of antithrombotics/antiplatelets: Secondary | ICD-10-CM | POA: Diagnosis not present

## 2019-10-30 DIAGNOSIS — D649 Anemia, unspecified: Secondary | ICD-10-CM | POA: Diagnosis not present

## 2019-10-30 DIAGNOSIS — G5603 Carpal tunnel syndrome, bilateral upper limbs: Secondary | ICD-10-CM | POA: Diagnosis not present

## 2019-10-30 DIAGNOSIS — J449 Chronic obstructive pulmonary disease, unspecified: Secondary | ICD-10-CM | POA: Diagnosis not present

## 2019-10-30 DIAGNOSIS — N183 Chronic kidney disease, stage 3 unspecified: Secondary | ICD-10-CM | POA: Diagnosis not present

## 2019-10-30 DIAGNOSIS — M159 Polyosteoarthritis, unspecified: Secondary | ICD-10-CM | POA: Diagnosis not present

## 2019-10-30 DIAGNOSIS — E039 Hypothyroidism, unspecified: Secondary | ICD-10-CM | POA: Diagnosis not present

## 2019-10-30 DIAGNOSIS — I2510 Atherosclerotic heart disease of native coronary artery without angina pectoris: Secondary | ICD-10-CM | POA: Diagnosis not present

## 2019-10-30 DIAGNOSIS — E118 Type 2 diabetes mellitus with unspecified complications: Secondary | ICD-10-CM | POA: Diagnosis not present

## 2019-10-30 DIAGNOSIS — I10 Essential (primary) hypertension: Secondary | ICD-10-CM | POA: Diagnosis not present

## 2019-10-30 DIAGNOSIS — I679 Cerebrovascular disease, unspecified: Secondary | ICD-10-CM | POA: Diagnosis not present

## 2019-10-30 DIAGNOSIS — E1140 Type 2 diabetes mellitus with diabetic neuropathy, unspecified: Secondary | ICD-10-CM | POA: Diagnosis not present

## 2019-10-30 DIAGNOSIS — A419 Sepsis, unspecified organism: Secondary | ICD-10-CM | POA: Diagnosis not present

## 2019-11-02 ENCOUNTER — Encounter: Payer: Self-pay | Admitting: Cardiology

## 2019-11-02 ENCOUNTER — Other Ambulatory Visit: Payer: Self-pay

## 2019-11-02 ENCOUNTER — Ambulatory Visit (INDEPENDENT_AMBULATORY_CARE_PROVIDER_SITE_OTHER): Payer: Medicare HMO | Admitting: Cardiology

## 2019-11-02 VITALS — BP 140/70 | HR 67 | Ht 62.0 in | Wt 170.0 lb

## 2019-11-02 DIAGNOSIS — E1169 Type 2 diabetes mellitus with other specified complication: Secondary | ICD-10-CM

## 2019-11-02 DIAGNOSIS — E669 Obesity, unspecified: Secondary | ICD-10-CM

## 2019-11-02 DIAGNOSIS — I10 Essential (primary) hypertension: Secondary | ICD-10-CM

## 2019-11-02 DIAGNOSIS — I2510 Atherosclerotic heart disease of native coronary artery without angina pectoris: Secondary | ICD-10-CM | POA: Diagnosis not present

## 2019-11-02 NOTE — Progress Notes (Signed)
Cardiology Consultation:    Date:  11/02/2019   ID:  Deanna Landry, DOB 01-Apr-1971, MRN [id number]  PCP:  Cher Nakai, MD  Cardiologist:  Jenne Campus, MD   Referring MD: Cher Nakai, MD   Chief Complaint  Patient presents with  . Hospitalization Follow-up    History of Present Illness:    Deanna Landry is a 48 y.o. female who is being seen today for the evaluation of coronary artery disease at the request of Cher Nakai, MD.  Past medical history is quite incredible she is a young 36 is a woman who few months ago sustained some injury to the right foot that required surgical intervention.  She was discharged home from the hospital however within 2 days she became confused febrile and she was admitted back to [facility] and then she was airlifted to [place].  She was managed there because of sepsis and DKA as well as pneumonia.  Also at the end of hospitalization cardiac catheterization was done and she ended up getting for stents into the right coronary artery.  He is coming today to my office to be established as a patient.  Overall she is doing better she said she feels good.  She still get some difficulty walking she is wearing boot on the right lower extremities that being taken care of by her primary care physician.  Denies have any chest pain tightness squeezing pressure burning chest still described to have sugar she tells me is much better right now however she does mention barely below 200.  She never smoked she does take statin.  Past Medical History:  Diagnosis Date  . CHF (congestive heart failure) ([place])   . Diabetes mellitus without complication ([place])   . Hypertension   . Myocardial infarct, old   . Renal disorder   . Stroke [facility])     Past Surgical History:  Procedure Laterality Date  . ABDOMINAL HYSTERECTOMY    . APPLICATION OF WOUND VAC Right 10/11/2019   Procedure: WOUND VAC CHANGE TO RIGHT LEG;  Surgeon: Shona Needles, MD;  Location: [place];  Service:  Orthopedics;  Laterality: Right;  . EYE SURGERY    . HARDWARE REMOVAL Right 10/06/2019   Procedure: HARDWARE REMOVAL;  Surgeon: Shona Needles, MD;  Location: [place];  Service: Orthopedics;  Laterality: Right;  . I & D EXTREMITY Right 10/06/2019   Procedure: IRRIGATION AND DEBRIDEMENT EXTREMITY;  Surgeon: Shona Needles, MD;  Location: [place];  Service: Orthopedics;  Laterality: Right;  . I & D EXTREMITY Right 10/09/2019   Procedure: IRRIGATION AND DEBRIDEMENT EXTREMITY and WOUND VAC CHANGE RIGHT ANKLE;  Surgeon: Shona Needles, MD;  Location: [place];  Service: Orthopedics;  Laterality: Right;  . ORIF ANKLE FRACTURE Right 07/26/2019   Procedure: OPEN REDUCTION INTERNAL FIXATION (ORIF) ANKLE FRACTURE;  Surgeon: Shona Needles, MD;  Location: [place];  Service: Orthopedics;  Laterality: Right;  OPEN REDUCTION INTERNAL FIXATION (ORIF) ANKLE FRACTURE   . TOE AMPUTATION      Current Medications: Current Meds  Medication Sig  . ACCU-CHEK AVIVA PLUS test strip USE TO CHECK BLOOD SUGAR 4 TIMES A DAY  . acetaminophen (TYLENOL) 325 MG tablet Take 1 tablet (325 mg total) by mouth every 6 (six) hours as needed for mild pain, moderate pain or fever.  Marland Kitchen amLODipine (NORVASC) 10 MG tablet Take 10 mg by mouth daily.  Marland Kitchen aspirin EC 81 MG tablet Take 81 mg by mouth daily.  Marland Kitchen  atorvastatin (LIPITOR) 80 MG tablet Take 80 mg by mouth daily.  Marland Kitchen buPROPion (WELLBUTRIN XL) 300 MG 24 hr tablet Take 300 mg by mouth daily.  . clopidogrel (PLAVIX) 75 MG tablet Take 75 mg by mouth daily.  . isosorbide dinitrate (ISORDIL) 20 MG tablet Take 20 mg by mouth 2 (two) times daily.  Marland Kitchen KLOR-CON M20 20 MEQ tablet Take 1 tablet by mouth daily.  Marland Kitchen latanoprost (XALATAN) 0.005 % ophthalmic solution Place 1 drop into the left eye every evening.   Marland Kitchen LEVEMIR 100 UNIT/ML injection Inject 36 Units into the skin 2 (two) times daily.   Marland Kitchen levothyroxine (SYNTHROID) 50 MCG tablet Take 50 mcg by mouth daily.  . methocarbamol (ROBAXIN) 500 MG tablet  Take 1 tablet (500 mg total) by mouth every 6 (six) hours as needed for muscle spasms.  . metoprolol tartrate (LOPRESSOR) 25 MG tablet Take 12.5 mg by mouth 2 (two) times daily.  Marland Kitchen NOVOLOG FLEXPEN 100 UNIT/ML FlexPen Inject 10 Units into the skin 3 (three) times daily.  . pantoprazole (PROTONIX) 40 MG tablet Take 40 mg by mouth daily.  Vladimir Faster Glycol-Propyl Glycol (SYSTANE) 0.4-0.3 % GEL ophthalmic gel Place 1 application into both eyes 3 (three) times daily as needed (dry eye).  . pregabalin (LYRICA) 75 MG capsule Take 75 mg by mouth 2 (two) times daily.  . sertraline (ZOLOFT) 100 MG tablet Take 100 mg by mouth daily.  . TRULICITY 1.5 0000000 SOPN Inject 1.5 mg into the skin every Monday.      Allergies:   Patient has no known allergies.   Social History   Socioeconomic History  . Marital status: Divorced    Spouse name: Not on file  . Number of children: Not on file  . Years of education: Not on file  . Highest education level: Not on file  Occupational History  . Not on file  Tobacco Use  . Smoking status: Never Smoker  . Smokeless tobacco: Never Used  Substance and Sexual Activity  . Alcohol use: Not Currently  . Drug use: Not Currently  . Sexual activity: Not on file  Other Topics Concern  . Not on file  Social History Narrative  . Not on file   Social Determinants of Health   Financial Resource Strain:   . Difficulty of Paying Living Expenses: Not on file  Food Insecurity:   . Worried About Charity fundraiser in the Last Year: Not on file  . Ran Out of Food in the Last Year: Not on file  Transportation Needs: No Transportation Needs  . Lack of Transportation (Medical): No  . Lack of Transportation (Non-Medical): No  Physical Activity:   . Days of Exercise per Week: Not on file  . Minutes of Exercise per Session: Not on file  Stress:   . Feeling of Stress : Not on file  Social Connections:   . Frequency of Communication with Friends and Family: Not on file   . Frequency of Social Gatherings with Friends and Family: Not on file  . Attends Religious Services: Not on file  . Active Member of Clubs or Organizations: Not on file  . Attends Archivist Meetings: Not on file  . Marital Status: Not on file     Family History: The patient's family history includes Congestive Heart Failure in her father and maternal grandfather; Diabetes in her sister; Diabetes Mellitus II in her mother; Heart attack in her mother; Stomach cancer in her father; Stroke in her  mother. ROS:   Please see the history of present illness.    All 14 point review of systems negative except as described per history of present illness.  EKGs/Labs/Other Studies Reviewed:    The following studies were reviewed today: All information from the patient and her mother.  [facility] will [facility] to get more permanent information's.  Luckily they did have information card about stent implantation she got.  Recent Labs: 03/11/2019: Magnesium 2.3 03/12/2019: TSH 1.102 10/03/2019: ALT 11 10/12/2019: BUN 20; Creatinine, Ser 1.76; Hemoglobin 7.3; Platelets 258; Potassium 5.2; Sodium 139  Recent Lipid Panel No results found for: CHOL, TRIG, HDL, CHOLHDL, VLDL, LDLCALC, LDLDIRECT  Physical Exam:    VS:  BP 140/70   Pulse 67   Ht 5\' 2"  (1.575 m)   Wt 170 lb (77.1 kg)   LMP  (LMP Unknown) Comment: hysterectomy 2014  SpO2 95%   BMI 31.09 kg/m     Wt Readings from Last 3 Encounters:  11/02/19 170 lb (77.1 kg)  10/05/19 181 lb 7 oz (82.3 kg)  07/26/19 186 lb (84.4 kg)     GEN:  Well nourished, well developed in no acute distress HEENT: Normal NECK: No JVD; No carotid bruits LYMPHATICS: No lymphadenopathy CARDIAC: RRR, no murmurs, no rubs, no gallops RESPIRATORY:  Clear to auscultation without rales, wheezing or rhonchi  ABDOMEN: Soft, non-tender, non-distended MUSCULOSKELETAL:  No edema; No deformity  SKIN: Warm and dry NEUROLOGIC:  Alert and oriented x  3 PSYCHIATRIC:  Normal affect   ASSESSMENT:    1. Essential hypertension   2. Coronary artery disease involving native coronary artery of native heart without angina pectoris   3. Diabetes mellitus type 2 in obese [facility])    PLAN:    In order of problems listed above:  1. Coronary artery disease status post 4 drug-eluting stent to right coronary artery.  She also tells me that years ago she had myocardial infarction however no cardiac catheterization was done at that time.  She is on dual antiplatelets therapy which I will continue she is also on statin which I will continue.  Obviously will [facility] trying to see exactly what was done and why was not done.  I hope that she did have echocardiogram will be able to look at this and see what her left ventricle ejection fraction is. 2. Essential hypertension blood pressure appears to be well controlled we will continue present management. 3. Diabetes mellitus that is being managed by internal medicine team.  With her complexity I think it would be beneficial for her to see endocrinologist.  She tells me that she did see one endocrinologist in her life however she did not like that person did not follow-up on that. 4. Dyslipidemia she is on statin which I will continue in the future we will check her fasting lipid profile target LDL is way below 70.  Overall it is a incredibly complex young lady with multiple medical problems part of it is poorly controlled diabetes.  Also important heart issue.  We will get records from [facility] trying to get more sense about what was done and why I see her back within the next few weeks to continue this discussion.   Medication Adjustments/Labs and Tests Ordered: Current medicines are reviewed at length with the patient today.  Concerns regarding medicines are outlined above.  No orders of the defined types were placed in this encounter.  No orders of the defined types were placed  in this  encounter.   Signed, Park Liter, MD, [facility]. 11/02/2019 5:03 PM    [place]

## 2019-11-02 NOTE — Patient Instructions (Signed)
Medication Instructions:  Your physician recommends that you continue on your current medications as directed. Please refer to the Current Medication list given to you today.  *If you need a refill on your cardiac medications before your next appointment, please call your pharmacy*  Lab Work: None.  If you have labs (blood work) drawn today and your tests are completely normal, you will receive your results only by: . MyChart Message (if you have MyChart) OR . A paper copy in the mail If you have any lab test that is abnormal or we need to change your treatment, we will call you to review the results.  Testing/Procedures: None.   Follow-Up: At [facility], you and your health needs are our priority.  As part of our continuing mission to provide you with exceptional heart care, we have created designated Provider Care Teams.  These Care Teams include your primary Cardiologist (physician) and Advanced Practice Providers (APPs -  Physician Assistants and Nurse Practitioners) who all work together to provide you with the care you need, when you need it.  Your next appointment:   2 month(s)  The format for your next appointment:   In Person  Provider:   Robert Krasowski, MD  Other Instructions  

## 2019-11-03 DIAGNOSIS — E039 Hypothyroidism, unspecified: Secondary | ICD-10-CM | POA: Diagnosis not present

## 2019-11-03 DIAGNOSIS — I679 Cerebrovascular disease, unspecified: Secondary | ICD-10-CM | POA: Diagnosis not present

## 2019-11-03 DIAGNOSIS — D649 Anemia, unspecified: Secondary | ICD-10-CM | POA: Diagnosis not present

## 2019-11-03 DIAGNOSIS — J449 Chronic obstructive pulmonary disease, unspecified: Secondary | ICD-10-CM | POA: Diagnosis not present

## 2019-11-03 DIAGNOSIS — I2510 Atherosclerotic heart disease of native coronary artery without angina pectoris: Secondary | ICD-10-CM | POA: Diagnosis not present

## 2019-11-03 DIAGNOSIS — N183 Chronic kidney disease, stage 3 unspecified: Secondary | ICD-10-CM | POA: Diagnosis not present

## 2019-11-03 DIAGNOSIS — F419 Anxiety disorder, unspecified: Secondary | ICD-10-CM | POA: Diagnosis not present

## 2019-11-03 DIAGNOSIS — G5603 Carpal tunnel syndrome, bilateral upper limbs: Secondary | ICD-10-CM | POA: Diagnosis not present

## 2019-11-03 DIAGNOSIS — M159 Polyosteoarthritis, unspecified: Secondary | ICD-10-CM | POA: Diagnosis not present

## 2019-11-15 DIAGNOSIS — J449 Chronic obstructive pulmonary disease, unspecified: Secondary | ICD-10-CM | POA: Diagnosis not present

## 2019-11-15 DIAGNOSIS — E1143 Type 2 diabetes mellitus with diabetic autonomic (poly)neuropathy: Secondary | ICD-10-CM | POA: Diagnosis not present

## 2019-11-15 DIAGNOSIS — E1140 Type 2 diabetes mellitus with diabetic neuropathy, unspecified: Secondary | ICD-10-CM | POA: Diagnosis not present

## 2019-11-15 DIAGNOSIS — M159 Polyosteoarthritis, unspecified: Secondary | ICD-10-CM | POA: Diagnosis not present

## 2019-11-15 DIAGNOSIS — I2510 Atherosclerotic heart disease of native coronary artery without angina pectoris: Secondary | ICD-10-CM | POA: Diagnosis not present

## 2019-11-15 DIAGNOSIS — F419 Anxiety disorder, unspecified: Secondary | ICD-10-CM | POA: Diagnosis not present

## 2019-11-15 DIAGNOSIS — G5603 Carpal tunnel syndrome, bilateral upper limbs: Secondary | ICD-10-CM | POA: Diagnosis not present

## 2019-11-15 DIAGNOSIS — I679 Cerebrovascular disease, unspecified: Secondary | ICD-10-CM | POA: Diagnosis not present

## 2019-11-15 DIAGNOSIS — I5042 Chronic combined systolic (congestive) and diastolic (congestive) heart failure: Secondary | ICD-10-CM | POA: Diagnosis not present

## 2019-11-15 DIAGNOSIS — E039 Hypothyroidism, unspecified: Secondary | ICD-10-CM | POA: Diagnosis not present

## 2019-11-15 DIAGNOSIS — F3341 Major depressive disorder, recurrent, in partial remission: Secondary | ICD-10-CM | POA: Diagnosis not present

## 2019-11-15 DIAGNOSIS — I509 Heart failure, unspecified: Secondary | ICD-10-CM | POA: Diagnosis not present

## 2019-11-15 DIAGNOSIS — E1122 Type 2 diabetes mellitus with diabetic chronic kidney disease: Secondary | ICD-10-CM | POA: Diagnosis not present

## 2019-11-15 DIAGNOSIS — D649 Anemia, unspecified: Secondary | ICD-10-CM | POA: Diagnosis not present

## 2019-11-15 DIAGNOSIS — N183 Chronic kidney disease, stage 3 unspecified: Secondary | ICD-10-CM | POA: Diagnosis not present

## 2019-11-22 ENCOUNTER — Other Ambulatory Visit: Payer: Self-pay

## 2019-11-22 NOTE — Patient Outreach (Signed)
Deanna Landry) Care Management  11/22/2019  Deanna Landry 16-Mar-1971 [id number]   Telephone Assessment   Outreach attempt to patient. No answer after multiple rings and unable to leave message.    Plan: RN CM will make outreach attempt to patient within the month of February.   Deanna Montgomery, RN,BSN,CCM [facility] Telephonic Care Management Coordinator Direct Phone: [phone] Toll Free: [phone] Fax: [phone]

## 2019-11-26 DIAGNOSIS — S82851D Displaced trimalleolar fracture of right lower leg, subsequent encounter for closed fracture with routine healing: Secondary | ICD-10-CM | POA: Diagnosis not present

## 2019-11-26 DIAGNOSIS — S82871D Displaced pilon fracture of right tibia, subsequent encounter for closed fracture with routine healing: Secondary | ICD-10-CM | POA: Diagnosis not present

## 2019-11-26 DIAGNOSIS — M6281 Muscle weakness (generalized): Secondary | ICD-10-CM | POA: Diagnosis not present

## 2019-11-28 DIAGNOSIS — S82871D Displaced pilon fracture of right tibia, subsequent encounter for closed fracture with routine healing: Secondary | ICD-10-CM | POA: Diagnosis not present

## 2019-11-29 DIAGNOSIS — J449 Chronic obstructive pulmonary disease, unspecified: Secondary | ICD-10-CM | POA: Diagnosis not present

## 2019-11-29 DIAGNOSIS — I679 Cerebrovascular disease, unspecified: Secondary | ICD-10-CM | POA: Diagnosis not present

## 2019-11-29 DIAGNOSIS — M159 Polyosteoarthritis, unspecified: Secondary | ICD-10-CM | POA: Diagnosis not present

## 2019-11-29 DIAGNOSIS — I2510 Atherosclerotic heart disease of native coronary artery without angina pectoris: Secondary | ICD-10-CM | POA: Diagnosis not present

## 2019-11-29 DIAGNOSIS — E785 Hyperlipidemia, unspecified: Secondary | ICD-10-CM | POA: Diagnosis not present

## 2019-11-29 DIAGNOSIS — F419 Anxiety disorder, unspecified: Secondary | ICD-10-CM | POA: Diagnosis not present

## 2019-11-29 DIAGNOSIS — I10 Essential (primary) hypertension: Secondary | ICD-10-CM | POA: Diagnosis not present

## 2019-11-29 DIAGNOSIS — N183 Chronic kidney disease, stage 3 unspecified: Secondary | ICD-10-CM | POA: Diagnosis not present

## 2019-11-29 DIAGNOSIS — D649 Anemia, unspecified: Secondary | ICD-10-CM | POA: Diagnosis not present

## 2019-11-29 DIAGNOSIS — G5603 Carpal tunnel syndrome, bilateral upper limbs: Secondary | ICD-10-CM | POA: Diagnosis not present

## 2019-12-04 ENCOUNTER — Telehealth: Payer: Self-pay | Admitting: Cardiology

## 2019-12-04 NOTE — Telephone Encounter (Signed)
New Message:    Please have Dr Raliegh Ip called [name]. Pt is at the office now.

## 2019-12-08 DIAGNOSIS — M159 Polyosteoarthritis, unspecified: Secondary | ICD-10-CM | POA: Diagnosis not present

## 2019-12-08 DIAGNOSIS — F3341 Major depressive disorder, recurrent, in partial remission: Secondary | ICD-10-CM | POA: Diagnosis not present

## 2019-12-08 DIAGNOSIS — E1122 Type 2 diabetes mellitus with diabetic chronic kidney disease: Secondary | ICD-10-CM | POA: Diagnosis not present

## 2019-12-08 DIAGNOSIS — D649 Anemia, unspecified: Secondary | ICD-10-CM | POA: Diagnosis not present

## 2019-12-08 DIAGNOSIS — I679 Cerebrovascular disease, unspecified: Secondary | ICD-10-CM | POA: Diagnosis not present

## 2019-12-08 DIAGNOSIS — E1143 Type 2 diabetes mellitus with diabetic autonomic (poly)neuropathy: Secondary | ICD-10-CM | POA: Diagnosis not present

## 2019-12-08 DIAGNOSIS — F419 Anxiety disorder, unspecified: Secondary | ICD-10-CM | POA: Diagnosis not present

## 2019-12-08 DIAGNOSIS — N183 Chronic kidney disease, stage 3 unspecified: Secondary | ICD-10-CM | POA: Diagnosis not present

## 2019-12-08 DIAGNOSIS — E875 Hyperkalemia: Secondary | ICD-10-CM | POA: Diagnosis not present

## 2019-12-08 DIAGNOSIS — I509 Heart failure, unspecified: Secondary | ICD-10-CM | POA: Diagnosis not present

## 2019-12-08 DIAGNOSIS — E1140 Type 2 diabetes mellitus with diabetic neuropathy, unspecified: Secondary | ICD-10-CM | POA: Diagnosis not present

## 2019-12-08 DIAGNOSIS — I2510 Atherosclerotic heart disease of native coronary artery without angina pectoris: Secondary | ICD-10-CM | POA: Diagnosis not present

## 2019-12-08 DIAGNOSIS — J449 Chronic obstructive pulmonary disease, unspecified: Secondary | ICD-10-CM | POA: Diagnosis not present

## 2019-12-08 DIAGNOSIS — G5603 Carpal tunnel syndrome, bilateral upper limbs: Secondary | ICD-10-CM | POA: Diagnosis not present

## 2019-12-15 DIAGNOSIS — F419 Anxiety disorder, unspecified: Secondary | ICD-10-CM | POA: Diagnosis not present

## 2019-12-15 DIAGNOSIS — N183 Chronic kidney disease, stage 3 unspecified: Secondary | ICD-10-CM | POA: Diagnosis not present

## 2019-12-15 DIAGNOSIS — E875 Hyperkalemia: Secondary | ICD-10-CM | POA: Diagnosis not present

## 2019-12-15 DIAGNOSIS — G5603 Carpal tunnel syndrome, bilateral upper limbs: Secondary | ICD-10-CM | POA: Diagnosis not present

## 2019-12-15 DIAGNOSIS — I679 Cerebrovascular disease, unspecified: Secondary | ICD-10-CM | POA: Diagnosis not present

## 2019-12-15 DIAGNOSIS — D649 Anemia, unspecified: Secondary | ICD-10-CM | POA: Diagnosis not present

## 2019-12-15 DIAGNOSIS — M159 Polyosteoarthritis, unspecified: Secondary | ICD-10-CM | POA: Diagnosis not present

## 2019-12-15 DIAGNOSIS — I2510 Atherosclerotic heart disease of native coronary artery without angina pectoris: Secondary | ICD-10-CM | POA: Diagnosis not present

## 2019-12-15 DIAGNOSIS — J449 Chronic obstructive pulmonary disease, unspecified: Secondary | ICD-10-CM | POA: Diagnosis not present

## 2019-12-16 DIAGNOSIS — I5042 Chronic combined systolic (congestive) and diastolic (congestive) heart failure: Secondary | ICD-10-CM | POA: Diagnosis not present

## 2019-12-20 ENCOUNTER — Other Ambulatory Visit: Payer: Self-pay

## 2019-12-20 NOTE — Patient Outreach (Signed)
[facility] Care Management  12/20/2019  Makailah D Ferrington 12-16-1970 [id number]   Telephone Assessment    Outreach attempt to patient. No answer at present and unable to leave message     Plan: RN CM will make outreach attempt to patient within the month of Y[place] if no return call from patient.   Enzo Montgomery, RN,BSN,CCM [place] Telephonic Care Management Coordinator Direct Phone: [phone] Toll Free: [phone] Fax: [phone]

## 2019-12-21 ENCOUNTER — Ambulatory Visit: Payer: Self-pay

## 2019-12-27 DIAGNOSIS — S82851D Displaced trimalleolar fracture of right lower leg, subsequent encounter for closed fracture with routine healing: Secondary | ICD-10-CM | POA: Diagnosis not present

## 2019-12-27 DIAGNOSIS — M6281 Muscle weakness (generalized): Secondary | ICD-10-CM | POA: Diagnosis not present

## 2019-12-27 DIAGNOSIS — S82871D Displaced pilon fracture of right tibia, subsequent encounter for closed fracture with routine healing: Secondary | ICD-10-CM | POA: Diagnosis not present

## 2019-12-29 DIAGNOSIS — G5603 Carpal tunnel syndrome, bilateral upper limbs: Secondary | ICD-10-CM | POA: Diagnosis not present

## 2019-12-29 DIAGNOSIS — I2510 Atherosclerotic heart disease of native coronary artery without angina pectoris: Secondary | ICD-10-CM | POA: Diagnosis not present

## 2019-12-29 DIAGNOSIS — J449 Chronic obstructive pulmonary disease, unspecified: Secondary | ICD-10-CM | POA: Diagnosis not present

## 2019-12-29 DIAGNOSIS — N183 Chronic kidney disease, stage 3 unspecified: Secondary | ICD-10-CM | POA: Diagnosis not present

## 2019-12-29 DIAGNOSIS — I679 Cerebrovascular disease, unspecified: Secondary | ICD-10-CM | POA: Diagnosis not present

## 2019-12-29 DIAGNOSIS — E875 Hyperkalemia: Secondary | ICD-10-CM | POA: Diagnosis not present

## 2019-12-29 DIAGNOSIS — D649 Anemia, unspecified: Secondary | ICD-10-CM | POA: Diagnosis not present

## 2019-12-29 DIAGNOSIS — F419 Anxiety disorder, unspecified: Secondary | ICD-10-CM | POA: Diagnosis not present

## 2019-12-29 DIAGNOSIS — M159 Polyosteoarthritis, unspecified: Secondary | ICD-10-CM | POA: Diagnosis not present

## 2019-12-30 DIAGNOSIS — R4182 Altered mental status, unspecified: Secondary | ICD-10-CM | POA: Diagnosis not present

## 2020-01-03 ENCOUNTER — Telehealth: Payer: Self-pay | Admitting: Cardiology

## 2020-01-03 ENCOUNTER — Ambulatory Visit (INDEPENDENT_AMBULATORY_CARE_PROVIDER_SITE_OTHER): Payer: Medicare HMO | Admitting: Cardiology

## 2020-01-03 ENCOUNTER — Other Ambulatory Visit: Payer: Self-pay

## 2020-01-03 ENCOUNTER — Encounter: Payer: Self-pay | Admitting: Cardiology

## 2020-01-03 VITALS — BP 126/78 | HR 72 | Ht 62.0 in | Wt 169.0 lb

## 2020-01-03 DIAGNOSIS — E785 Hyperlipidemia, unspecified: Secondary | ICD-10-CM

## 2020-01-03 DIAGNOSIS — I2510 Atherosclerotic heart disease of native coronary artery without angina pectoris: Secondary | ICD-10-CM

## 2020-01-03 DIAGNOSIS — E109 Type 1 diabetes mellitus without complications: Secondary | ICD-10-CM | POA: Diagnosis not present

## 2020-01-03 DIAGNOSIS — I10 Essential (primary) hypertension: Secondary | ICD-10-CM

## 2020-01-03 HISTORY — DX: Hyperlipidemia, unspecified: E78.5

## 2020-01-03 NOTE — Patient Instructions (Signed)
Medication Instructions:  Your physician recommends that you continue on your current medications as directed. Please refer to the Current Medication list given to you today.  *If you need a refill on your cardiac medications before your next appointment, please call your pharmacy*  Lab Work: None.  If you have labs (blood work) drawn today and your tests are completely normal, you will receive your results only by: Marland Kitchen MyChart Message (if you have MyChart) OR . A paper copy in the mail If you have any lab test that is abnormal or we need to change your treatment, we will call you to review the results.  Testing/Procedures: Your physician has requested that you have an echocardiogram. Echocardiography is a painless test that uses sound waves to create images of your heart. It provides your doctor with information about the size and shape of your heart and how well your heart's chambers and valves are working. This procedure takes approximately one hour. There are no restrictions for this procedure.  Your physician has requested that you have a carotid duplex. This test is an ultrasound of the carotid arteries in your neck. It looks at blood flow through these arteries that supply the brain with blood. Allow one hour for this exam. There are no restrictions or special instructions.    Follow-Up: At [facility], you and your health needs are our priority.  As part of our continuing mission to provide you with exceptional heart care, we have created designated Provider Care Teams.  These Care Teams include your primary Cardiologist (physician) and Advanced Practice Providers (APPs -  Physician Assistants and Nurse Practitioners) who all work together to provide you with the care you need, when you need it.  Your next appointment:   4 month(s)  The format for your next appointment:   In Person  Provider:   Jenne Campus, MD  Other Instructions   Echocardiogram An echocardiogram is  a procedure that uses painless sound waves (ultrasound) to produce an image of the heart. Images from an echocardiogram can provide important information about:  Signs of coronary artery disease (CAD).  Aneurysm detection. An aneurysm is a weak or damaged part of an artery wall that bulges out from the normal force of blood pumping through the body.  Heart size and shape. Changes in the size or shape of the heart can be associated with certain conditions, including heart failure, aneurysm, and CAD.  Heart muscle function.  Heart valve function.  Signs of a past heart attack.  Fluid buildup around the heart.  Thickening of the heart muscle.  A tumor or infectious growth around the heart valves. Tell a health care provider about:  Any allergies you have.  All medicines you are taking, including vitamins, herbs, eye drops, creams, and over-the-counter medicines.  Any blood disorders you have.  Any surgeries you have had.  Any medical conditions you have.  Whether you are pregnant or may be pregnant. What are the risks? Generally, this is a safe procedure. However, problems may occur, including:  Allergic reaction to dye (contrast) that may be used during the procedure. What happens before the procedure? No specific preparation is needed. You may eat and drink normally. What happens during the procedure?   An IV tube may be inserted into one of your veins.  You may receive contrast through this tube. A contrast is an injection that improves the quality of the pictures from your heart.  A gel will be applied to your chest.  A wand-like tool (transducer) will be moved over your chest. The gel will help to transmit the sound waves from the transducer.  The sound waves will harmlessly bounce off of your heart to allow the heart images to be captured in real-time motion. The images will be recorded on a computer. The procedure may vary among health care providers and  hospitals. What happens after the procedure?  You may return to your normal, everyday life, including diet, activities, and medicines, unless your health care provider tells you not to do that. Summary  An echocardiogram is a procedure that uses painless sound waves (ultrasound) to produce an image of the heart.  Images from an echocardiogram can provide important information about the size and shape of your heart, heart muscle function, heart valve function, and fluid buildup around your heart.  You do not need to do anything to prepare before this procedure. You may eat and drink normally.  After the echocardiogram is completed, you may return to your normal, everyday life, unless your health care provider tells you not to do that. This information is not intended to replace advice given to you by your health care provider. Make sure you discuss any questions you have with your health care provider. Document Revised: 02/23/2019 Document Reviewed: 12/05/2016 Elsevier Patient Education  [place].

## 2020-01-03 NOTE — Progress Notes (Signed)
Cardiology Office Note:    Date:  01/03/2020   ID:  Deanna Landry, DOB August 19, 1971, MRN [id number]  PCP:  Cher Nakai, MD  Cardiologist:  Jenne Campus, MD    Referring MD: Cher Nakai, MD   No chief complaint on file. Doing well  History of Present Illness:    Deanna Landry is a 49 y.o. female with past medical history significant for longstanding, poorly controlled diabetes, coronary artery disease with multiple stenting placed in the one session to right coronary artery in the fall 2020, so is quite incredible, she presented to the hospital with some wound in her leg became septic DKA eventually was transferred to [facility] aggressively managed with antibiotic and also cardiac catheterization done which revealed problem with the right coronary artery that was addressed with multiple stents.  Since that time she seems to be doing well.  She denies having a chest pain, tightness, squeezing, pressure, burning in the chest.  Finally she had her both removed from her right leg.  This is the side that she get surgery done.  She started walking slowly but carefully  Past Medical History:  Diagnosis Date  . CHF (congestive heart failure) ([place])   . Diabetes mellitus without complication ([place])   . Hypertension   . Myocardial infarct, old   . Renal disorder   . Stroke [facility])     Past Surgical History:  Procedure Laterality Date  . ABDOMINAL HYSTERECTOMY    . APPLICATION OF WOUND VAC Right 10/11/2019   Procedure: WOUND VAC CHANGE TO RIGHT LEG;  Surgeon: Shona Needles, MD;  Location: [place];  Service: Orthopedics;  Laterality: Right;  . EYE SURGERY    . HARDWARE REMOVAL Right 10/06/2019   Procedure: HARDWARE REMOVAL;  Surgeon: Shona Needles, MD;  Location: [place];  Service: Orthopedics;  Laterality: Right;  . I & D EXTREMITY Right 10/06/2019   Procedure: IRRIGATION AND DEBRIDEMENT EXTREMITY;  Surgeon: Shona Needles, MD;  Location: [place];  Service: Orthopedics;  Laterality: Right;  . I & D  EXTREMITY Right 10/09/2019   Procedure: IRRIGATION AND DEBRIDEMENT EXTREMITY and WOUND VAC CHANGE RIGHT ANKLE;  Surgeon: Shona Needles, MD;  Location: [place];  Service: Orthopedics;  Laterality: Right;  . ORIF ANKLE FRACTURE Right 07/26/2019   Procedure: OPEN REDUCTION INTERNAL FIXATION (ORIF) ANKLE FRACTURE;  Surgeon: Shona Needles, MD;  Location: [place];  Service: Orthopedics;  Laterality: Right;  OPEN REDUCTION INTERNAL FIXATION (ORIF) ANKLE FRACTURE   . TOE AMPUTATION      Current Medications: Current Meds  Medication Sig  . ACCU-CHEK AVIVA PLUS test strip USE TO CHECK BLOOD SUGAR 4 TIMES A DAY  . amLODipine (NORVASC) 10 MG tablet Take 10 mg by mouth daily.  Marland Kitchen aspirin EC 81 MG tablet Take 81 mg by mouth daily.  Marland Kitchen atorvastatin (LIPITOR) 80 MG tablet Take 80 mg by mouth daily.  Marland Kitchen buPROPion (WELLBUTRIN XL) 300 MG 24 hr tablet Take 300 mg by mouth daily.  . clopidogrel (PLAVIX) 75 MG tablet Take 75 mg by mouth daily.  . isosorbide dinitrate (ISORDIL) 20 MG tablet Take 20 mg by mouth 2 (two) times daily.  Marland Kitchen latanoprost (XALATAN) 0.005 % ophthalmic solution Place 1 drop into the left eye every evening.   Marland Kitchen LEVEMIR 100 UNIT/ML injection Inject 36 Units into the skin 2 (two) times daily.   Marland Kitchen levothyroxine (SYNTHROID) 50 MCG tablet Take 50 mcg by mouth daily.  . metoprolol tartrate (LOPRESSOR) 25 MG  tablet Take 12.5 mg by mouth 2 (two) times daily.  Marland Kitchen NOVOLOG FLEXPEN 100 UNIT/ML FlexPen Inject 10 Units into the skin 3 (three) times daily.  . pantoprazole (PROTONIX) 40 MG tablet Take 40 mg by mouth daily.  Vladimir Faster Glycol-Propyl Glycol (SYSTANE) 0.4-0.3 % GEL ophthalmic gel Place 1 application into both eyes 3 (three) times daily as needed (dry eye).  . pregabalin (LYRICA) 75 MG capsule Take 75 mg by mouth 2 (two) times daily.  . sertraline (ZOLOFT) 100 MG tablet Take 100 mg by mouth daily.  . TRULICITY 1.5 0000000 SOPN Inject 1.5 mg into the skin every Monday.   . [DISCONTINUED] KLOR-CON  M20 20 MEQ tablet Take 1 tablet by mouth daily.     Allergies:   Patient has no known allergies.   Social History   Socioeconomic History  . Marital status: Divorced    Spouse name: Not on file  . Number of children: Not on file  . Years of education: Not on file  . Highest education level: Not on file  Occupational History  . Not on file  Tobacco Use  . Smoking status: Never Smoker  . Smokeless tobacco: Never Used  Substance and Sexual Activity  . Alcohol use: Not Currently  . Drug use: Not Currently  . Sexual activity: Not on file  Other Topics Concern  . Not on file  Social History Narrative  . Not on file   Social Determinants of Health   Financial Resource Strain:   . Difficulty of Paying Living Expenses: Not on file  Food Insecurity:   . Worried About Charity fundraiser in the Last Year: Not on file  . Ran Out of Food in the Last Year: Not on file  Transportation Needs: No Transportation Needs  . Lack of Transportation (Medical): No  . Lack of Transportation (Non-Medical): No  Physical Activity:   . Days of Exercise per Week: Not on file  . Minutes of Exercise per Session: Not on file  Stress:   . Feeling of Stress : Not on file  Social Connections:   . Frequency of Communication with Friends and Family: Not on file  . Frequency of Social Gatherings with Friends and Family: Not on file  . Attends Religious Services: Not on file  . Active Member of Clubs or Organizations: Not on file  . Attends Archivist Meetings: Not on file  . Marital Status: Not on file     Family History: The patient's family history includes Congestive Heart Failure in her father and maternal grandfather; Diabetes in her sister; Diabetes Mellitus II in her mother; Heart attack in her mother; Stomach cancer in her father; Stroke in her mother. ROS:   Please see the history of present illness.    All 14 point review of systems negative except as described per history of  present illness  EKGs/Labs/Other Studies Reviewed:      Recent Labs: 03/11/2019: Magnesium 2.3 03/12/2019: TSH 1.102 10/03/2019: ALT 11 10/12/2019: BUN 20; Creatinine, Ser 1.76; Hemoglobin 7.3; Platelets 258; Potassium 5.2; Sodium 139  Recent Lipid Panel No results found for: CHOL, TRIG, HDL, CHOLHDL, VLDL, LDLCALC, LDLDIRECT  Physical Exam:    VS:  BP 126/78   Pulse 72   Ht 5\' 2"  (1.575 m)   Wt 169 lb (76.7 kg)   LMP  (LMP Unknown) Comment: hysterectomy 2014  SpO2 95%   BMI 30.91 kg/m     Wt Readings from Last 3 Encounters:  01/03/20  169 lb (76.7 kg)  11/02/19 170 lb (77.1 kg)  10/05/19 181 lb 7 oz (82.3 kg)     GEN:  Well nourished, well developed in no acute distress HEENT: Normal NECK: No JVD; No carotid bruits LYMPHATICS: No lymphadenopathy CARDIAC: RRR, no murmurs, no rubs, no gallops RESPIRATORY:  Clear to auscultation without rales, wheezing or rhonchi  ABDOMEN: Soft, non-tender, non-distended MUSCULOSKELETAL:  No edema; No deformity  SKIN: Warm and dry LOWER EXTREMITIES: no swelling NEUROLOGIC:  Alert and oriented x 3 PSYCHIATRIC:  Normal affect   ASSESSMENT:    1. Coronary artery disease involving native coronary artery of native heart without angina pectoris   2. Type 1 diabetes mellitus without complication (HCC)   3. Essential hypertension   4. Dyslipidemia    PLAN:    In order of problems listed above:  1. Coronary disease status post PTCA and stenting with multiple stents to right coronary artery.  She is maintaining dual antiplatelets therapy which I will continue.  I stressed importance of taking this therapy for at least 12 months and in his situation because of premature coronary artery disease and multiple risk factors we may be forced to continue indefinitely dual antiplatelet therapy.  Luckily, she is doing well.  She denies having any symptoms. 2. Diabetes followed by 10 medicine team.  She is scheduled to see endocrinologist which I think  would be very beneficial to her. 3. Essential hypertension blood pressure well controlled continue present management. 4. Dyslipidemia I reviewed K PN which showed me her total cholesterol as well as HDL which was 52.  For some reason I do not have LDL. 5. Essential hypertension: Blood pressure well controlled.  I will schedule her to have echocardiogram to assess left ventricle ejection fraction, also carotic ultrasounds will be done as the overall risks assessment.   Medication Adjustments/Labs and Tests Ordered: Current medicines are reviewed at length with the patient today.  Concerns regarding medicines are outlined above.  No orders of the defined types were placed in this encounter.  Medication changes: No orders of the defined types were placed in this encounter.   Signed, Park Liter, MD, [facility] 01/03/2020 [facility]

## 2020-01-03 NOTE — Telephone Encounter (Signed)
   [facility] Pre-operative Risk Assessment    Request for surgical clearance:  1. What type of surgery is being performed? cataracts surgery  2. When is this surgery scheduled? TBD  3. What type of clearance is required (medical clearance vs. Pharmacy clearance to hold med vs. Both)? medical  4. Are there any medications that need to be held prior to surgery and how long? no  5. Practice name and name of physician performing surgery? [facility], Dr. Manuella Ghazi  6. What is your office phone number: [phone]   4.   What is your office fax number: [phone]  8.   Anesthesia type (None, local, MAC, general) ? IV sedation   Selena Zobro 01/03/2020, 3:14 PM  _________________________________________________________________   (provider comments below)

## 2020-01-03 NOTE — Addendum Note (Signed)
Addended by: Ashok Norris on: 01/03/2020 02:04 PM   Modules accepted: Orders

## 2020-01-04 NOTE — Telephone Encounter (Signed)
   Primary Cardiologist: Jenne Campus, MD  Chart reviewed as part of pre-operative protocol coverage. Cataract extractions are recognized in guidelines as low risk surgeries that do not typically require specific preoperative testing or holding of blood thinner therapy. Therefore, given past medical history and time since last visit, based on ACC/AHA guidelines, Mystery D Tewalt would be at acceptable risk for the planned procedure without further cardiovascular testing.   I will route this recommendation to the requesting party via Epic fax function and remove from pre-op pool.  Please call with questions.  Tami Lin Sharyah Bostwick, PA 01/04/2020, 10:31 AM

## 2020-01-12 DIAGNOSIS — E118 Type 2 diabetes mellitus with unspecified complications: Secondary | ICD-10-CM | POA: Diagnosis not present

## 2020-01-12 DIAGNOSIS — J449 Chronic obstructive pulmonary disease, unspecified: Secondary | ICD-10-CM | POA: Diagnosis not present

## 2020-01-12 DIAGNOSIS — G5603 Carpal tunnel syndrome, bilateral upper limbs: Secondary | ICD-10-CM | POA: Diagnosis not present

## 2020-01-12 DIAGNOSIS — I2510 Atherosclerotic heart disease of native coronary artery without angina pectoris: Secondary | ICD-10-CM | POA: Diagnosis not present

## 2020-01-12 DIAGNOSIS — D649 Anemia, unspecified: Secondary | ICD-10-CM | POA: Diagnosis not present

## 2020-01-12 DIAGNOSIS — N183 Chronic kidney disease, stage 3 unspecified: Secondary | ICD-10-CM | POA: Diagnosis not present

## 2020-01-12 DIAGNOSIS — I679 Cerebrovascular disease, unspecified: Secondary | ICD-10-CM | POA: Diagnosis not present

## 2020-01-12 DIAGNOSIS — M159 Polyosteoarthritis, unspecified: Secondary | ICD-10-CM | POA: Diagnosis not present

## 2020-01-12 DIAGNOSIS — F419 Anxiety disorder, unspecified: Secondary | ICD-10-CM | POA: Diagnosis not present

## 2020-01-14 DIAGNOSIS — I5042 Chronic combined systolic (congestive) and diastolic (congestive) heart failure: Secondary | ICD-10-CM | POA: Diagnosis not present

## 2020-01-19 DIAGNOSIS — H402223 Chronic angle-closure glaucoma, left eye, severe stage: Secondary | ICD-10-CM | POA: Diagnosis not present

## 2020-01-19 DIAGNOSIS — H409 Unspecified glaucoma: Secondary | ICD-10-CM | POA: Diagnosis not present

## 2020-01-19 DIAGNOSIS — H25812 Combined forms of age-related cataract, left eye: Secondary | ICD-10-CM | POA: Diagnosis not present

## 2020-01-21 DIAGNOSIS — M5489 Other dorsalgia: Secondary | ICD-10-CM | POA: Diagnosis not present

## 2020-01-24 DIAGNOSIS — S82851D Displaced trimalleolar fracture of right lower leg, subsequent encounter for closed fracture with routine healing: Secondary | ICD-10-CM | POA: Diagnosis not present

## 2020-01-24 DIAGNOSIS — M6281 Muscle weakness (generalized): Secondary | ICD-10-CM | POA: Diagnosis not present

## 2020-01-24 DIAGNOSIS — S82871D Displaced pilon fracture of right tibia, subsequent encounter for closed fracture with routine healing: Secondary | ICD-10-CM | POA: Diagnosis not present

## 2020-01-26 DIAGNOSIS — I2510 Atherosclerotic heart disease of native coronary artery without angina pectoris: Secondary | ICD-10-CM | POA: Diagnosis not present

## 2020-01-26 DIAGNOSIS — J449 Chronic obstructive pulmonary disease, unspecified: Secondary | ICD-10-CM | POA: Diagnosis not present

## 2020-01-26 DIAGNOSIS — N183 Chronic kidney disease, stage 3 unspecified: Secondary | ICD-10-CM | POA: Diagnosis not present

## 2020-01-26 DIAGNOSIS — E162 Hypoglycemia, unspecified: Secondary | ICD-10-CM | POA: Diagnosis not present

## 2020-01-26 DIAGNOSIS — I10 Essential (primary) hypertension: Secondary | ICD-10-CM | POA: Diagnosis not present

## 2020-01-26 DIAGNOSIS — G5603 Carpal tunnel syndrome, bilateral upper limbs: Secondary | ICD-10-CM | POA: Diagnosis not present

## 2020-01-26 DIAGNOSIS — M159 Polyosteoarthritis, unspecified: Secondary | ICD-10-CM | POA: Diagnosis not present

## 2020-01-26 DIAGNOSIS — E161 Other hypoglycemia: Secondary | ICD-10-CM | POA: Diagnosis not present

## 2020-01-26 DIAGNOSIS — F419 Anxiety disorder, unspecified: Secondary | ICD-10-CM | POA: Diagnosis not present

## 2020-01-26 DIAGNOSIS — E118 Type 2 diabetes mellitus with unspecified complications: Secondary | ICD-10-CM | POA: Diagnosis not present

## 2020-01-26 DIAGNOSIS — R404 Transient alteration of awareness: Secondary | ICD-10-CM | POA: Diagnosis not present

## 2020-01-26 DIAGNOSIS — R4020 Unspecified coma: Secondary | ICD-10-CM | POA: Diagnosis not present

## 2020-01-26 DIAGNOSIS — D649 Anemia, unspecified: Secondary | ICD-10-CM | POA: Diagnosis not present

## 2020-01-26 DIAGNOSIS — I679 Cerebrovascular disease, unspecified: Secondary | ICD-10-CM | POA: Diagnosis not present

## 2020-01-30 DIAGNOSIS — S82871D Displaced pilon fracture of right tibia, subsequent encounter for closed fracture with routine healing: Secondary | ICD-10-CM | POA: Diagnosis not present

## 2020-02-02 DIAGNOSIS — N183 Chronic kidney disease, stage 3 unspecified: Secondary | ICD-10-CM | POA: Diagnosis not present

## 2020-02-02 DIAGNOSIS — F419 Anxiety disorder, unspecified: Secondary | ICD-10-CM | POA: Diagnosis not present

## 2020-02-02 DIAGNOSIS — M159 Polyosteoarthritis, unspecified: Secondary | ICD-10-CM | POA: Diagnosis not present

## 2020-02-02 DIAGNOSIS — I2510 Atherosclerotic heart disease of native coronary artery without angina pectoris: Secondary | ICD-10-CM | POA: Diagnosis not present

## 2020-02-02 DIAGNOSIS — E118 Type 2 diabetes mellitus with unspecified complications: Secondary | ICD-10-CM | POA: Diagnosis not present

## 2020-02-02 DIAGNOSIS — J449 Chronic obstructive pulmonary disease, unspecified: Secondary | ICD-10-CM | POA: Diagnosis not present

## 2020-02-02 DIAGNOSIS — G5603 Carpal tunnel syndrome, bilateral upper limbs: Secondary | ICD-10-CM | POA: Diagnosis not present

## 2020-02-02 DIAGNOSIS — I10 Essential (primary) hypertension: Secondary | ICD-10-CM | POA: Diagnosis not present

## 2020-02-02 DIAGNOSIS — D649 Anemia, unspecified: Secondary | ICD-10-CM | POA: Diagnosis not present

## 2020-02-02 DIAGNOSIS — I679 Cerebrovascular disease, unspecified: Secondary | ICD-10-CM | POA: Diagnosis not present

## 2020-02-04 DIAGNOSIS — R61 Generalized hyperhidrosis: Secondary | ICD-10-CM | POA: Diagnosis not present

## 2020-02-04 DIAGNOSIS — R404 Transient alteration of awareness: Secondary | ICD-10-CM | POA: Diagnosis not present

## 2020-02-04 DIAGNOSIS — E161 Other hypoglycemia: Secondary | ICD-10-CM | POA: Diagnosis not present

## 2020-02-04 DIAGNOSIS — E162 Hypoglycemia, unspecified: Secondary | ICD-10-CM | POA: Diagnosis not present

## 2020-02-04 DIAGNOSIS — R4020 Unspecified coma: Secondary | ICD-10-CM | POA: Diagnosis not present

## 2020-02-14 DIAGNOSIS — M159 Polyosteoarthritis, unspecified: Secondary | ICD-10-CM | POA: Diagnosis not present

## 2020-02-14 DIAGNOSIS — F419 Anxiety disorder, unspecified: Secondary | ICD-10-CM | POA: Diagnosis not present

## 2020-02-14 DIAGNOSIS — K219 Gastro-esophageal reflux disease without esophagitis: Secondary | ICD-10-CM | POA: Diagnosis not present

## 2020-02-14 DIAGNOSIS — I2510 Atherosclerotic heart disease of native coronary artery without angina pectoris: Secondary | ICD-10-CM | POA: Diagnosis not present

## 2020-02-14 DIAGNOSIS — J449 Chronic obstructive pulmonary disease, unspecified: Secondary | ICD-10-CM | POA: Diagnosis not present

## 2020-02-14 DIAGNOSIS — N183 Chronic kidney disease, stage 3 unspecified: Secondary | ICD-10-CM | POA: Diagnosis not present

## 2020-02-14 DIAGNOSIS — D649 Anemia, unspecified: Secondary | ICD-10-CM | POA: Diagnosis not present

## 2020-02-14 DIAGNOSIS — I679 Cerebrovascular disease, unspecified: Secondary | ICD-10-CM | POA: Diagnosis not present

## 2020-02-14 DIAGNOSIS — G5603 Carpal tunnel syndrome, bilateral upper limbs: Secondary | ICD-10-CM | POA: Diagnosis not present

## 2020-02-15 DIAGNOSIS — Z961 Presence of intraocular lens: Secondary | ICD-10-CM | POA: Diagnosis not present

## 2020-02-15 DIAGNOSIS — I5042 Chronic combined systolic (congestive) and diastolic (congestive) heart failure: Secondary | ICD-10-CM | POA: Diagnosis not present

## 2020-02-19 ENCOUNTER — Other Ambulatory Visit: Payer: Self-pay

## 2020-02-19 NOTE — Patient Outreach (Signed)
[facility]) Care Management  02/19/2020  Lilienne D Zieger 09/10/1971 [id number]   Telephone Assessment     Outreach attempt to patient. No answer at present after several rings.     Plan: RN CM will make outreach attempt to patient within the month of June if no return call.   Enzo Montgomery, RN,BSN,CCM [place] [facility] Telephonic Care Management Coordinator Direct Phone: [phone] Toll Free: [phone] Fax: [phone]

## 2020-02-21 ENCOUNTER — Ambulatory Visit: Payer: Self-pay

## 2020-02-21 ENCOUNTER — Other Ambulatory Visit: Payer: Self-pay

## 2020-02-21 ENCOUNTER — Ambulatory Visit (INDEPENDENT_AMBULATORY_CARE_PROVIDER_SITE_OTHER): Payer: Medicare HMO

## 2020-02-21 DIAGNOSIS — Z8673 Personal history of transient ischemic attack (TIA), and cerebral infarction without residual deficits: Secondary | ICD-10-CM

## 2020-02-21 DIAGNOSIS — I10 Essential (primary) hypertension: Secondary | ICD-10-CM | POA: Diagnosis not present

## 2020-02-21 DIAGNOSIS — E785 Hyperlipidemia, unspecified: Secondary | ICD-10-CM

## 2020-02-21 DIAGNOSIS — I2510 Atherosclerotic heart disease of native coronary artery without angina pectoris: Secondary | ICD-10-CM

## 2020-02-21 NOTE — Progress Notes (Signed)
Complete echocardiogram has been performed.  Jimmy Adisyn Ruscitti RDCS, RVT 

## 2020-02-21 NOTE — Progress Notes (Signed)
Carotid duplex exam performed  Jimmy Chryl Holten RDCS, RVT 

## 2020-02-22 ENCOUNTER — Encounter: Payer: Self-pay | Admitting: Endocrinology

## 2020-02-22 ENCOUNTER — Other Ambulatory Visit: Payer: Self-pay

## 2020-02-22 ENCOUNTER — Ambulatory Visit: Payer: Medicare HMO | Admitting: Endocrinology

## 2020-02-22 NOTE — Progress Notes (Signed)
Patient ID: Deanna Landry, female   DOB: [birth year], 49 y.o.   MRN: [id number]           Reason for Appointment : Consultation for Type 1 Diabetes  History of Present Illness   Referring HCP:Lee, Keung         Diagnosis: Type 1 diabetes mellitus, date of diagnosis: Age 74         Previous history:   She has been on insulin since her diagnosis when she had significant symptomatic hypoglycemia. Has been on various types of insulin regimens in the past but usually not followed by endocrinologist The last 5 or 6 years she has been on Levemir and NovoLog Previous level of control is variable with A1c only inconsistently below 7  Recent history:   INSULIN regimen: BASAL insulin:  36 units hs Mealtime insulin: NovoLog 10 units before meals  Non-insulin hypoglycemic drugs: Trulicity 1.5 mg weekly  Most recent A1c 6.3 done on 02/02/2020  Current management, blood sugar patterns and problems identified:    She is currently taking Levemir insulin once a day although previously had been taking this twice a day  She is using syringes for injection with the help of her mother.  However is unable to see the markings on the syringe because of her visual impairment although previously has used pens  She did not bring any meter today but is using the same one as her mother's  Blood sugars by recall are as below but she will get low blood sugars in the mornings 2-3 times a week  Does not recognize low blood sugars, her blood sugar was 47 this morning around 5 AM  Although she does not eat breakfast usually her blood sugars are generally higher by lunchtime and then she will take her NovoLog for lunch  Blood sugars fluctuate significantly the rest of the day  Today her blood sugar is 230 without any breakfast but she treated her low blood sugar at 5 AM this morning  She did not adjust her NovoLog based on what she is eating and is taking a fixed dose of 10 units regardless of her blood sugar  or the carbohydrate content  Did not have any knowledge of carbohydrate counting  Her mother has diabetes also and is trying to help her with day-to-day management  She apparently was gaining weight and her PCP put her on Trulicity a few months ago and she thinks this has helped her control and limit her weight gain        Glucose monitoring:  is being done 4-5  times a day         Glucometer:  Accu-Chek/One Touch       Blood Glucose readings from recall:   PRE-MEAL Fasting Lunch Dinner Bedtime Overall  Glucose range: 47-120 200 85-250 98-220   Mean/median:         Hypoglycemia:  occurs mostly overnight Factors causing hyperglycemia: Excessive Levemir at night Symptoms of hypoglycemia:none or minimal Treatment of hypoglycemia: Juices, if patient is unconscious mother will call the ambulance         Self-care: The diet that the patient has been following is: None, does not know carbohydrate counting Immediately skipping breakfast, lunch will be assignments, dinner usually consists of bread or pasta Snacks will be peanut butter crackers, yogurt, cottage cheese, fruit Eating out only about once or twice a month   Mealtimes are: Breakfast none Lunch: 1 pm Dinner: 6pm  Exercise: None          Dietician consultation none.          Diabetes labs:  Lab Results  Component Value Date   HGBA1C 7.7 (H) 10/05/2019   HGBA1C 6.8 (H) 03/10/2019   Lab Results  Component Value Date   CREATININE 1.76 (H) 10/12/2019    No results found for: MICRALBCREAT   Allergies as of 02/23/2020   No Known Allergies     Medication List       Accurate as of Meagon 9, 2021 10:06 AM. If you have any questions, ask your nurse or doctor.        STOP taking these medications   latanoprost 0.005 % ophthalmic solution Commonly known as: XALATAN Stopped by: Elayne Snare, MD     TAKE these medications   Accu-Chek Aviva Plus test strip Generic drug: glucose blood USE TO CHECK BLOOD SUGAR 5  TIMES A DAY What changed: See the new instructions. Changed by: Jayme Cloud, LPN   Accu-Chek Aviva Plus w/Device Kit Use Accu Chek Aviva Plus meter to check blood sugar 5 times daily. What changed:   how much to take  how to take this  when to take this Changed by: Jayme Cloud, LPN   Accu-Chek FastClix Lancets Misc Use Accu Chek Fastclix to check blood sugar 5 times daily. What changed:   how much to take  how to take this  when to take this Changed by: Jayme Cloud, LPN   amLODipine 10 MG tablet Commonly known as: NORVASC Take 10 mg by mouth daily.   aspirin EC 81 MG tablet Take 81 mg by mouth daily.   atorvastatin 80 MG tablet Commonly known as: LIPITOR Take 80 mg by mouth daily.   buPROPion 300 MG 24 hr tablet Commonly known as: WELLBUTRIN XL Take 300 mg by mouth daily.   clopidogrel 75 MG tablet Commonly known as: PLAVIX Take 75 mg by mouth daily.   Gvoke HypoPen 2-Pack 1 MG/0.2ML Soaj Generic drug: Glucagon Inject 1 mg into the skin as needed. For severe lows Started by: Elayne Snare, MD   isosorbide dinitrate 20 MG tablet Commonly known as: ISORDIL Take 20 mg by mouth 2 (two) times daily.   Levemir 100 UNIT/ML injection Generic drug: insulin detemir Inject 36 Units into the skin 2 (two) times daily.   levothyroxine 50 MCG tablet Commonly known as: SYNTHROID Take 50 mcg by mouth daily.   metoprolol tartrate 25 MG tablet Commonly known as: LOPRESSOR Take 12.5 mg by mouth 2 (two) times daily.   NovoLOG FlexPen 100 UNIT/ML FlexPen Generic drug: insulin aspart Inject 10 Units into the skin 3 (three) times daily.   pantoprazole 40 MG tablet Commonly known as: PROTONIX Take 40 mg by mouth daily.   pregabalin 75 MG capsule Commonly known as: LYRICA Take 75 mg by mouth 2 (two) times daily.   sertraline 100 MG tablet Commonly known as: ZOLOFT Take 100 mg by mouth daily.   Systane 0.4-0.3 % Gel ophthalmic gel Generic drug: Polyethyl  Glycol-Propyl Glycol Place 1 application into both eyes 3 (three) times daily as needed (dry eye).   [name] FlexTouch 200 UNIT/ML FlexTouch Pen Generic drug: insulin degludec Inject 30 Units into the skin daily. Started by: Elayne Snare, MD   Trulicity 1.5 WP/8.0DX Sopn Generic drug: Dulaglutide Inject 1.5 mg into the skin every Monday.       Allergies: No Known Allergies  Past Medical History:  Diagnosis Date  .  CHF (congestive heart failure) ([place])   . Diabetes mellitus without complication ([place])   . Hypertension   . Myocardial infarct, old   . Renal disorder   . Stroke [facility])     Past Surgical History:  Procedure Laterality Date  . ABDOMINAL HYSTERECTOMY    . APPLICATION OF WOUND VAC Right 10/11/2019   Procedure: WOUND VAC CHANGE TO RIGHT LEG;  Surgeon: Shona Needles, MD;  Location: [place];  Service: Orthopedics;  Laterality: Right;  . EYE SURGERY    . HARDWARE REMOVAL Right 10/06/2019   Procedure: HARDWARE REMOVAL;  Surgeon: Shona Needles, MD;  Location: [facility];  Service: Orthopedics;  Laterality: Right;  . I & D EXTREMITY Right 10/06/2019   Procedure: IRRIGATION AND DEBRIDEMENT EXTREMITY;  Surgeon: Shona Needles, MD;  Location: [facility];  Service: Orthopedics;  Laterality: Right;  . I & D EXTREMITY Right 10/09/2019   Procedure: IRRIGATION AND DEBRIDEMENT EXTREMITY and WOUND VAC CHANGE RIGHT ANKLE;  Surgeon: Shona Needles, MD;  Location: [place];  Service: Orthopedics;  Laterality: Right;  . ORIF ANKLE FRACTURE Right 07/26/2019   Procedure: OPEN REDUCTION INTERNAL FIXATION (ORIF) ANKLE FRACTURE;  Surgeon: Shona Needles, MD;  Location: [place];  Service: Orthopedics;  Laterality: Right;  OPEN REDUCTION INTERNAL FIXATION (ORIF) ANKLE FRACTURE   . TOE AMPUTATION      Family History  Problem Relation Age of Onset  . Diabetes Mellitus II Mother   . Stroke Mother   . Heart attack Mother   . Stomach cancer Father   . Congestive Heart Failure Father   . Diabetes Sister   .  Congestive Heart Failure Maternal Grandfather     Social History:  reports that she has never smoked. She has never used smokeless tobacco. She reports previous alcohol use. She reports previous drug use.      Review of Systems  Constitutional: Negative for weight loss and reduced appetite.  HENT: Negative for headaches.   Eyes: Positive for blurred vision.       Blind since age 35  Cardiovascular: Negative for chest pain.  Gastrointestinal:       May have occasional nausea  Endocrine: Positive for fatigue. Negative for cold intolerance.       Hypothyroid treated with levothyroxine 50 mcg and last TSH 4.1 done in 12/20  Musculoskeletal: Negative for joint pain.  Skin: Negative for rash.  Neurological: Positive for numbness.       She will get some pains and paresthesia in her legs treated with Lyrica and tramadol.  Also has numbness in her hands  Psychiatric/Behavioral:       Anxiety and depression treated with sertraline        Lipids: Last LDL was 80 done in 11/2019, on statin drug using 80 mg Lipitor  No results found for: CHOL, HDL, LDLCALC, LDLDIRECT, TRIG, CHOLHDL  Last dilated eye exam was in 7/89  DIABETES COMPLICATIONS:  LABS:  Office Visit on 02/23/2020  Component Date Value Ref Range Status  . POC Glucose 02/23/2020 230* 70 - 99 mg/dl Corrected    Physical Examination:  BP 130/70 (BP Location: Left Arm, Patient Position: Sitting, Cuff Size: Normal)   Pulse 76   Ht '5\' 2"'$  (1.575 m)   Wt 169 lb (76.7 kg)   LMP  (LMP Unknown) Comment: hysterectomy 2014  SpO2 97%   BMI 30.91 kg/m   GENERAL:  Generalized obesity present  HEENT:         Eye exam  shows normal external appearance.  Fundus exam exam deferred to ophthalmologist.  Oral exam not done as she has masked   NECK:         there is no lymphadenopathy.   Thyroid is not enlarged and no nodules felt.   LUNGS:         Chest is symmetrical. Lungs are clear to auscultation.Marland Kitchen   HEART:         Heart  sounds:  S1 and S2 are normal. No murmurs or clicks heard., no S3 or S4.   ABDOMEN:  no distention present. Liver and spleen are not palpable. No other mass or tenderness present.  EXTREMITIES:     There is no edema. No skin lesions present.Marland Kitchen   NEUROLOGICAL:        Vibration sense is markedly reduced in toes.  Ankle jerks are absent bilaterally.      Biceps reflexes appear normal   Diabetic Foot Exam - Simple   Simple Foot Form Diabetic Foot exam was performed with the following findings: Yes 02/23/2020 10:06 AM  Visual Inspection No deformities, no ulcerations, no other skin breakdown bilaterally: Yes Sensation Testing See comments: Yes Pulse Check Posterior Tibialis and Dorsalis pulse intact bilaterally: Yes Comments Absent monofilament sensation in the toes and distal feet         MUSCULOSKELETAL:       There is no enlargement or deformity of the joints.  SKIN:       No rash, lesions or abnormal pigmentation       ASSESSMENT:  Diabetes type 1, on basal bolus insulin with last A1c 6.3  Problems identified:  A1c is lower than expected for her blood sugar readings by recall and likely indicates excessive low or low normal blood sugars at least overnight  She has variable but mostly high readings the rest of the day  Appears to have less than 24-hour duration of action of Levemir  Currently is on excessive dose of bedtime Levemir with frequent low blood sugars as low as 47 and history of severe hypoglycemia  Inadequate knowledge of diabetes management, meal planning, adequate treatment of high and low blood sugars  Has no adjustment of her mealtime insulin based on [place] blood sugar and carbohydrates  Despite severe hypoglycemia she has not been using glucagon for treatment of severe episodes  Complications: Retinopathy, neuropathy, nephropathy  PLAN:   1. Glucose monitoring: . Patient advised to check readings consistently before each meal and at least once a day 2  hours after all of her meals.  For now she can try to use the Accu-Chek Aviva which she thinks she can read without difficulty . May also consider talking meter . Discussed use of freestyle libre in detail and she will request this from DME supplier  2.  Diabetes education: . Patient will need detailed diabetes education including carbohydrate counting and will be referred  3.  Lifestyle changes: . Dietary changes: Will need to make sure she has balanced meals with some carbohydrates at every meal and not excessive amounts of carbohydrate, she will be comfortably seen by diabetes educator and dietitian   4.  Insulin changes prescribed: . Switch Levemir to TRESIBA 30 units daily and discussed differences between this and Levemir . Given detailed information on how to titrate this every 3 days by 2 units to keep morning sugars between 90-130.  Given flowsheet to help adjust the dose of Tresiba based on fasting blood sugars.  Information on [place] given .  She will take 8 to 12 units of NovoLog before meals based on meal size and increase the dose by 1 unit for every 50 mg over 150  She will need to be treated for severe hypoglycemia by glucagon injections that her mother can do and prescription given for Gvoke, 2 pack injection kit and we discussed how to use this as well as timing of injection, dosage, site of injection and room temperature storage  5. Follow-up: 1 month  Patient Instructions  TRESIBA will replace Levemir once a day This insulin provides blood sugar control for up to 24 hours.    Start with 30 units at bedtime or suppertime daily and increase by 2 units every 3 days until the waking up sugars are under 130. Then continue the same dose.  If blood sugar is under 90 for 2 days in a row, reduce the dose by 4 units.  Note that this insulin does not control the rise of blood sugar with meals    NOVOLOG: Take 8 units for small meals, 10 units for regular size meals and 12 units  for larger meals are more carbohydrate like spaghetti  Call the DME supplier for the freestyle libre version 2.  Only the version 2 will alert you for low sugars      Elayne Snare 02/23/2020, 10:06 AM   Copy of consultation sent to referring physician   Note: This note was prepared with Dragon voice recognition system technology. Any transcriptional errors that result from this process are unintentional.

## 2020-02-23 ENCOUNTER — Telehealth: Payer: Self-pay

## 2020-02-23 ENCOUNTER — Encounter: Payer: Self-pay | Admitting: Endocrinology

## 2020-02-23 ENCOUNTER — Ambulatory Visit (INDEPENDENT_AMBULATORY_CARE_PROVIDER_SITE_OTHER): Payer: Medicare HMO | Admitting: Endocrinology

## 2020-02-23 ENCOUNTER — Other Ambulatory Visit: Payer: Self-pay

## 2020-02-23 VITALS — BP 130/70 | HR 76 | Ht 62.0 in | Wt 169.0 lb

## 2020-02-23 DIAGNOSIS — E1022 Type 1 diabetes mellitus with diabetic chronic kidney disease: Secondary | ICD-10-CM | POA: Diagnosis not present

## 2020-02-23 DIAGNOSIS — E1029 Type 1 diabetes mellitus with other diabetic kidney complication: Secondary | ICD-10-CM | POA: Diagnosis not present

## 2020-02-23 DIAGNOSIS — N182 Chronic kidney disease, stage 2 (mild): Secondary | ICD-10-CM

## 2020-02-23 DIAGNOSIS — R809 Proteinuria, unspecified: Secondary | ICD-10-CM

## 2020-02-23 DIAGNOSIS — E1065 Type 1 diabetes mellitus with hyperglycemia: Secondary | ICD-10-CM

## 2020-02-23 DIAGNOSIS — E1042 Type 1 diabetes mellitus with diabetic polyneuropathy: Secondary | ICD-10-CM

## 2020-02-23 LAB — GLUCOSE, POCT (MANUAL RESULT ENTRY): POC Glucose: 230 mg/dl — AB (ref 70–99)

## 2020-02-23 MED ORDER — ACCU-CHEK FASTCLIX LANCETS MISC: 2 refills | Status: DC

## 2020-02-23 MED ORDER — ACCU-CHEK AVIVA PLUS W/DEVICE KIT: PACK | 0 refills | Status: DC

## 2020-02-23 MED ORDER — ACCU-CHEK AVIVA PLUS VI STRP: ORAL_STRIP | 2 refills | Status: DC

## 2020-02-23 MED ORDER — TRESIBA FLEXTOUCH 200 UNIT/ML ~~LOC~~ SOPN: 30.0000 [IU] | PEN_INJECTOR | Freq: Every day | SUBCUTANEOUS | 1 refills | Status: DC

## 2020-02-23 MED ORDER — GVOKE HYPOPEN 2-PACK 1 MG/0.2ML ~~LOC~~ SOAJ: 1.0000 mg | SUBCUTANEOUS | 1 refills | Status: DC | PRN

## 2020-02-23 NOTE — Patient Instructions (Signed)
TRESIBA will replace Levemir once a day This insulin provides blood sugar control for up to 24 hours.    Start with 30 units at bedtime or suppertime daily and increase by 2 units every 3 days until the waking up sugars are under 130. Then continue the same dose.  If blood sugar is under 90 for 2 days in a row, reduce the dose by 4 units.  Note that this insulin does not control the rise of blood sugar with meals    NOVOLOG: Take 8 units for small meals, 10 units for regular size meals and 12 units for larger meals are more carbohydrate like spaghetti  Call the DME supplier for the freestyle libre version 2.  Only the version 2 will alert you for low sugars

## 2020-02-23 NOTE — Telephone Encounter (Signed)
Left message on patients voicemail to please return our call.   

## 2020-02-23 NOTE — Telephone Encounter (Signed)
Received fax from [employer] stating that the pt has been approved for [place] Flextouch U200. Approval is good until 11/15/2020.

## 2020-02-23 NOTE — Telephone Encounter (Signed)
-----   Message from Park Liter, MD sent at 02/22/2020  9:44 PM EDT ----- Echocardiogram showed normal left ventricle ejection fraction, mild LVH, trivial MR, overall looks good

## 2020-02-23 NOTE — Telephone Encounter (Signed)
Spoke with patient regarding results.  Patient verbalizes understanding and is agreeable to plan of care. Advised patient to call back with any issues or concerns.  

## 2020-02-23 NOTE — Telephone Encounter (Signed)
PA initiated for [employer] IQ meter r/t pt's visiual issues.   Keneisha Ragon Key: [id number] - PA Case ID: [id number] Need help? Call us at [phone] Status Sent to [place] IQ System w/Device kit Form [employer] Electronic PA Form   Your information has been submitted to [facility]. [employer] will review the request and will issue a decision, typically within 3-7 days from your submission. You can check the updated outcome later by reopening this request.  If [employer] has not responded in 3-7 days or if you have any questions about your ePA request, please contact [employer] at [phone]. If you think there may be a problem with your PA request, use our live chat feature at the bottom right.  For [place] requests, please call [phone].

## 2020-02-23 NOTE — Telephone Encounter (Signed)
Outcome:Denied today This request was denied under your Medicare Part D benefit; however, coverage for the requested drug(s) has been approved under Medicare Part B. [employer] follows Medicare rules. The Medicare rule in Chapter 6 of the Prescription Drug Manual says that items covered under the Part B benefit cannot be covered under Part D. The information we have says you need medical supplies that are not for injecting insulin. The Medicare Claims Processing Manual (Chapter 20) says Medicare Part B pays for supplies not associated with insulin delivery, such as testing supplies (e.g. blood sugar meters, control solution, test strips and lancets) or needle disposal systems, and for syringes not used for insulin. [employer] has approved coverage for your supplies under your Part B benefit for/through 11/15/2020. If you think Medicare Part D should cover this drug for you, you may appeal.

## 2020-02-23 NOTE — Telephone Encounter (Signed)
PA intiated via [email or web address] for Tresiba U200, injecting 30 units SQ daily.   Aracelis Sinclair (Key: [id number])  [name] FlexTouch (insulin degludec injection) 200 Units/mL solution   Form: Administrator, sports PA Form Created: 3 minutes ago Sent to Plan: 1 minute ago Plan Response: 1 minute ago Submit Clinical Questions: less than a minute ago Determination: Wait for Determination Please wait for [facility] NCPDP 2017 to return a determination.  Your information has been submitted to [facility]. [employer] will review the request and will issue a decision, typically within 3-7 days from your submission. You can check the updated outcome later by reopening this request.  If [employer] has not responded in 3-7 days or if you have any questions about your ePA request, please contact [employer] at [phone]. If you think there may be a problem with your PA request, use our live chat feature at the bottom right.  For [place] requests, please call [phone].

## 2020-02-24 DIAGNOSIS — S82851D Displaced trimalleolar fracture of right lower leg, subsequent encounter for closed fracture with routine healing: Secondary | ICD-10-CM | POA: Diagnosis not present

## 2020-02-24 DIAGNOSIS — S82871D Displaced pilon fracture of right tibia, subsequent encounter for closed fracture with routine healing: Secondary | ICD-10-CM | POA: Diagnosis not present

## 2020-02-24 DIAGNOSIS — M6281 Muscle weakness (generalized): Secondary | ICD-10-CM | POA: Diagnosis not present

## 2020-03-01 DIAGNOSIS — E118 Type 2 diabetes mellitus with unspecified complications: Secondary | ICD-10-CM | POA: Diagnosis not present

## 2020-03-01 DIAGNOSIS — I679 Cerebrovascular disease, unspecified: Secondary | ICD-10-CM | POA: Diagnosis not present

## 2020-03-01 DIAGNOSIS — M1830 Unilateral post-traumatic osteoarthritis of first carpometacarpal joint, unspecified hand: Secondary | ICD-10-CM | POA: Diagnosis not present

## 2020-03-01 DIAGNOSIS — I2510 Atherosclerotic heart disease of native coronary artery without angina pectoris: Secondary | ICD-10-CM | POA: Diagnosis not present

## 2020-03-01 DIAGNOSIS — D649 Anemia, unspecified: Secondary | ICD-10-CM | POA: Diagnosis not present

## 2020-03-01 DIAGNOSIS — G5603 Carpal tunnel syndrome, bilateral upper limbs: Secondary | ICD-10-CM | POA: Diagnosis not present

## 2020-03-01 DIAGNOSIS — F419 Anxiety disorder, unspecified: Secondary | ICD-10-CM | POA: Diagnosis not present

## 2020-03-01 DIAGNOSIS — M159 Polyosteoarthritis, unspecified: Secondary | ICD-10-CM | POA: Diagnosis not present

## 2020-03-01 DIAGNOSIS — J449 Chronic obstructive pulmonary disease, unspecified: Secondary | ICD-10-CM | POA: Diagnosis not present

## 2020-03-05 DIAGNOSIS — Z1331 Encounter for screening for depression: Secondary | ICD-10-CM | POA: Diagnosis not present

## 2020-03-05 DIAGNOSIS — Z0000 Encounter for general adult medical examination without abnormal findings: Secondary | ICD-10-CM | POA: Diagnosis not present

## 2020-03-05 DIAGNOSIS — Z1231 Encounter for screening mammogram for malignant neoplasm of breast: Secondary | ICD-10-CM | POA: Diagnosis not present

## 2020-03-05 DIAGNOSIS — Z9181 History of falling: Secondary | ICD-10-CM | POA: Diagnosis not present

## 2020-03-05 DIAGNOSIS — E785 Hyperlipidemia, unspecified: Secondary | ICD-10-CM | POA: Diagnosis not present

## 2020-03-11 IMAGING — RF ESOPHAGUS/BARIUM SWALLOW/TABLET STUDY
5 series · 13 of 13 positions shown · non-contrast
Comparison: None.

CLINICAL DATA: Mild discomfort with swallowing in the midesophagus.

EXAM:
ESOPHOGRAM/BARIUM SWALLOW
TECHNIQUE: Single contrast examination was performed using thin barium. Exam is
limited due to patient immobility
FLUOROSCOPY TIME:  Fluoroscopy Time:  1 minutes 4seconds
Radiation Exposure Index (if provided by the fluoroscopic device):
19.9 mGy
Number of Acquired Spot Images: 5

[Series 1: fluoro_barium 2fps_bw · 0.20mm/px · 1 of 1 slices shown (1 of 3)]
[im 1/1]
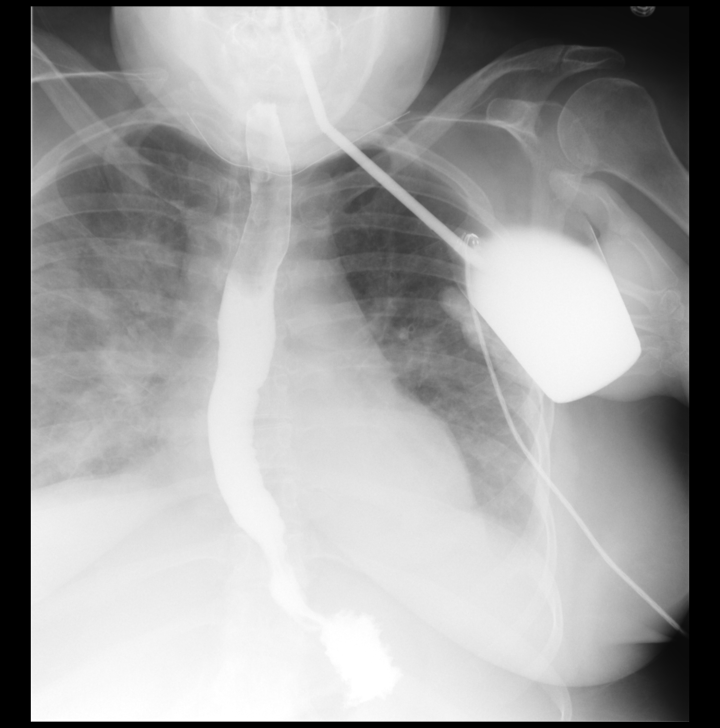

[Series 2: cp_standard · 0.60mm/px · 4 of 48 frames shown (1 of 2)]
[frame 3/48]
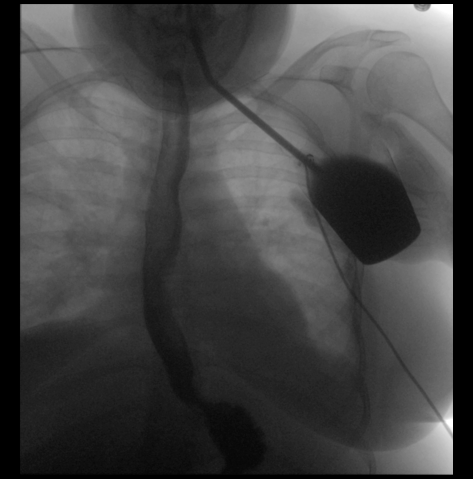
[frame 8/48]
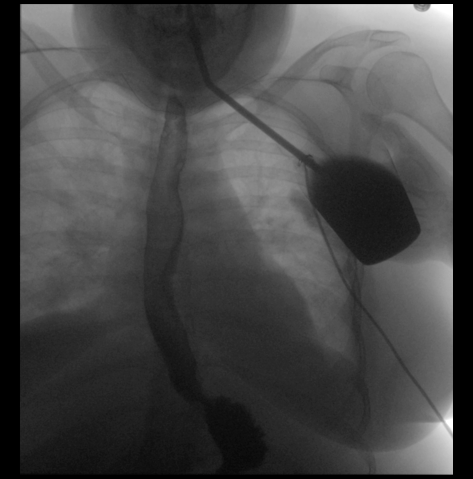
[frame 25/48]
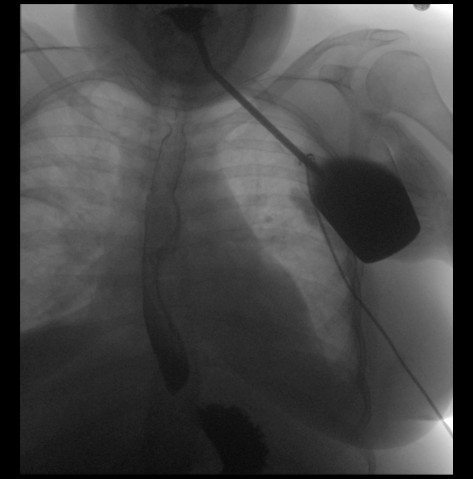
[frame 41/48]
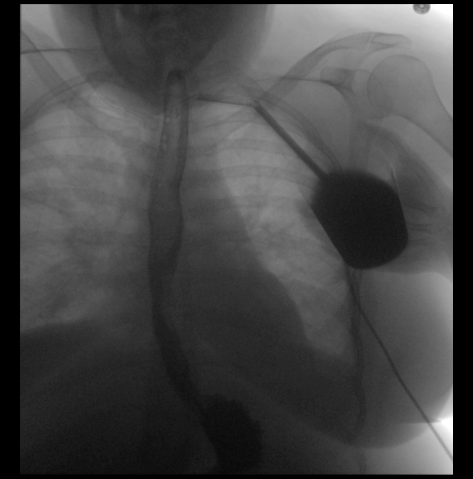

[Series 3: cp_standard · 0.40mm/px · 4 of 114 frames shown (2 of 2)]
[frame 18/114]
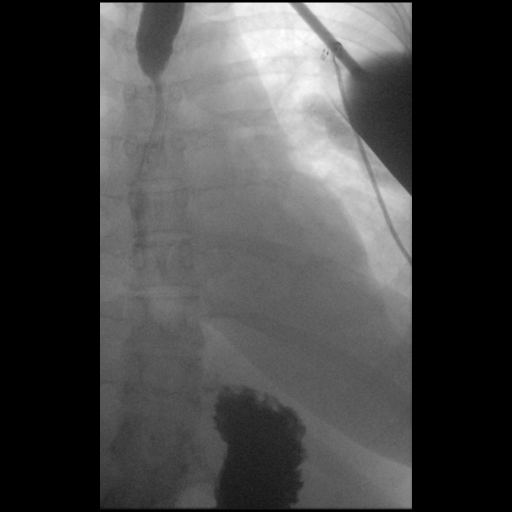
[frame 20/114]
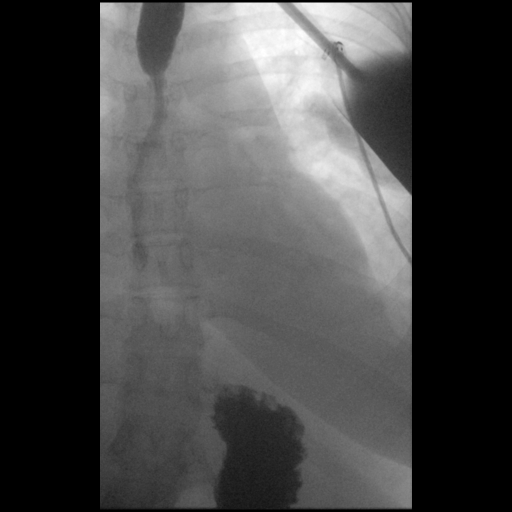
[frame 58/114]
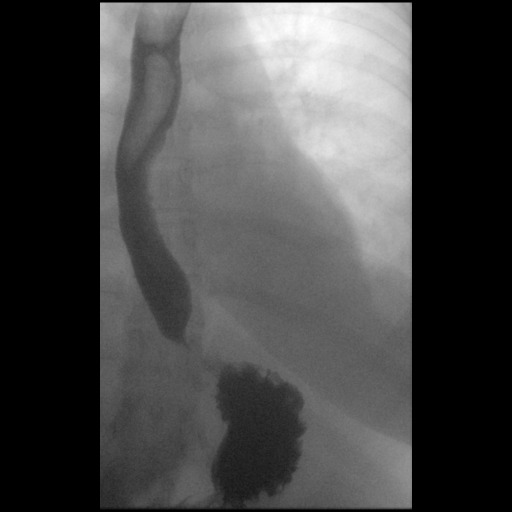
[frame 97/114]
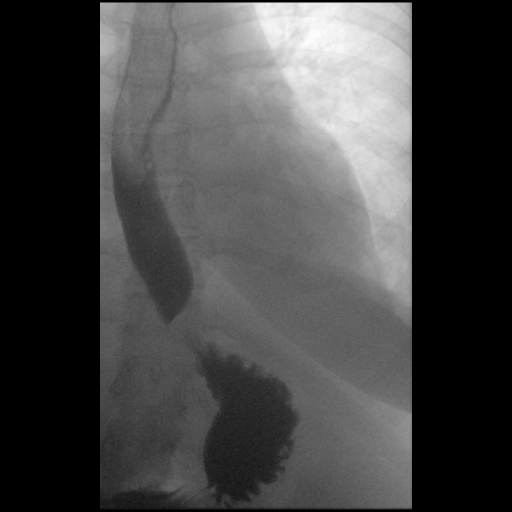

[Series 4: fluoro_barium 2fps_bw · 0.20mm/px · 2 of 2 frames shown (2 of 3)]
[frame 1/2]
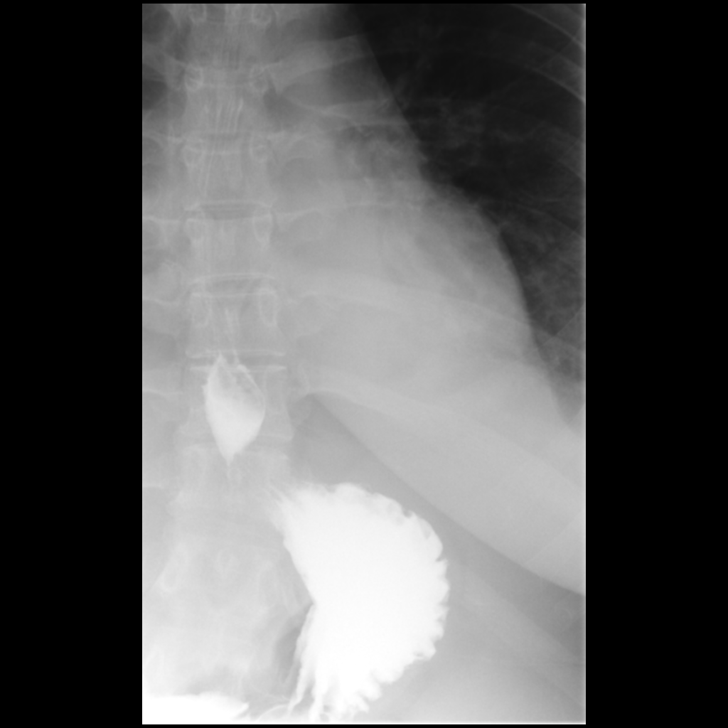
[frame 2/2]
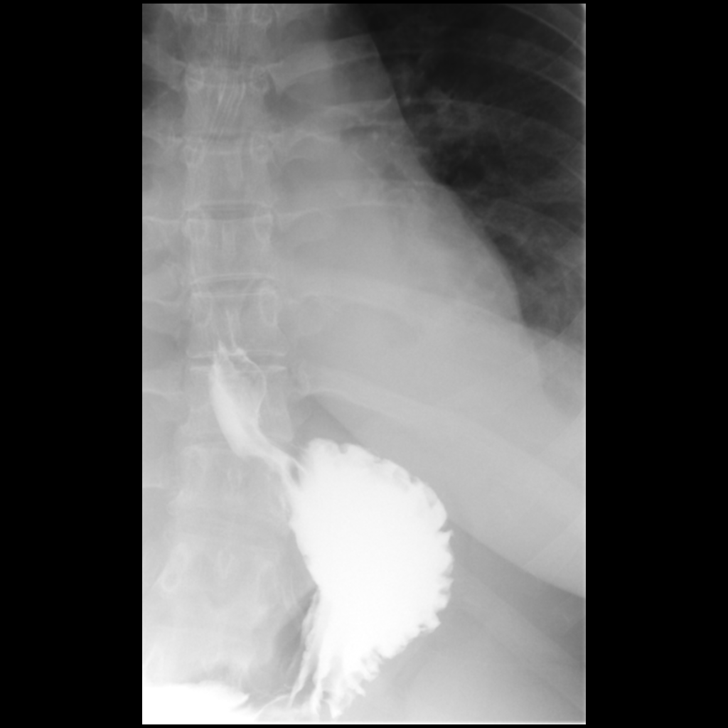

[Series 5: fluoro_barium 2fps_bw · 0.20mm/px · 2 of 2 frames shown (3 of 3)]
[frame 1/2]
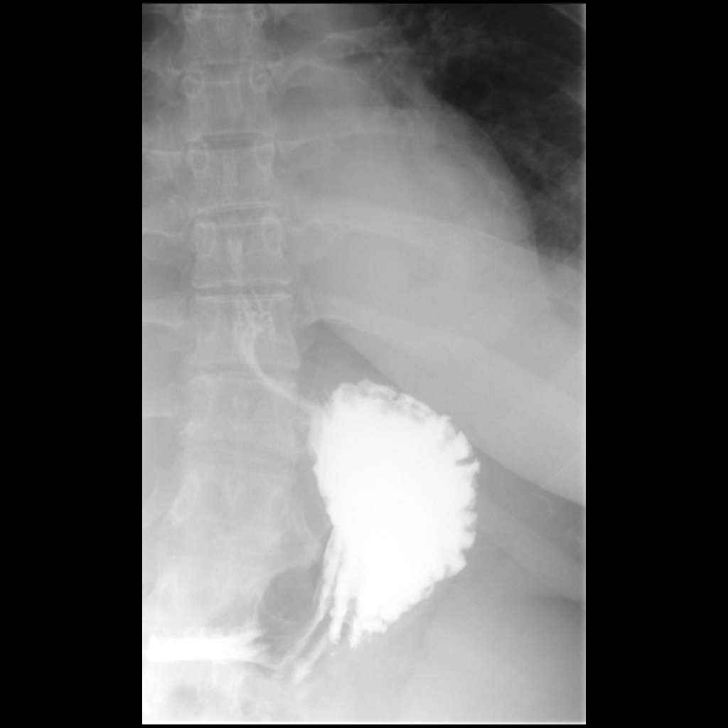
[frame 2/2]
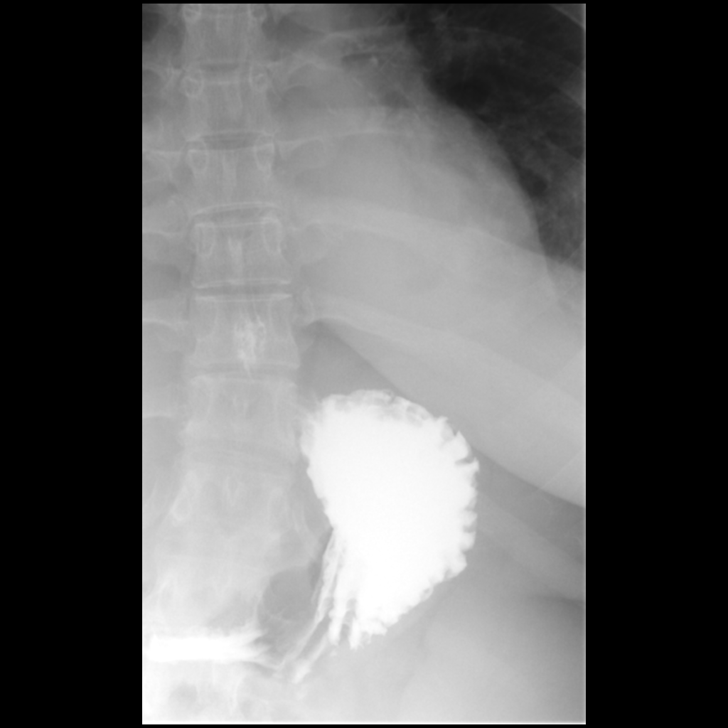

[13 of 13 positions shown; findings below may reference images not displayed]

FINDINGS: Exam limited due to patient immobility. Exam performed in
semi-upright position (approximately 45 degrees). No esophageal
stricture or mass. No esophageal mucosal abnormality identified. The
GE junction is widely patent.

A 13 mm barium tablet passed GE junction easily
IMPRESSION: 1. No stricture mass or obstruction of the esophagus.
2. No mucosal irregularity identified on limited exam.
3. Barium tablet passed GE junction easily

## 2020-03-15 DIAGNOSIS — J449 Chronic obstructive pulmonary disease, unspecified: Secondary | ICD-10-CM | POA: Diagnosis not present

## 2020-03-15 DIAGNOSIS — G5603 Carpal tunnel syndrome, bilateral upper limbs: Secondary | ICD-10-CM | POA: Diagnosis not present

## 2020-03-15 DIAGNOSIS — M159 Polyosteoarthritis, unspecified: Secondary | ICD-10-CM | POA: Diagnosis not present

## 2020-03-15 DIAGNOSIS — N183 Chronic kidney disease, stage 3 unspecified: Secondary | ICD-10-CM | POA: Diagnosis not present

## 2020-03-15 DIAGNOSIS — K219 Gastro-esophageal reflux disease without esophagitis: Secondary | ICD-10-CM | POA: Diagnosis not present

## 2020-03-15 DIAGNOSIS — I2510 Atherosclerotic heart disease of native coronary artery without angina pectoris: Secondary | ICD-10-CM | POA: Diagnosis not present

## 2020-03-15 DIAGNOSIS — F419 Anxiety disorder, unspecified: Secondary | ICD-10-CM | POA: Diagnosis not present

## 2020-03-15 DIAGNOSIS — E118 Type 2 diabetes mellitus with unspecified complications: Secondary | ICD-10-CM | POA: Diagnosis not present

## 2020-03-15 DIAGNOSIS — E1140 Type 2 diabetes mellitus with diabetic neuropathy, unspecified: Secondary | ICD-10-CM | POA: Diagnosis not present

## 2020-03-19 ENCOUNTER — Telehealth: Payer: Self-pay | Admitting: Nutrition

## 2020-03-19 NOTE — Telephone Encounter (Signed)
Patient says she does not have her Libre,and does not know if it is ordered.  Says has an appointment with Dr. Dwyane Dee Tuesday.  She is on vacation in [place] now.  Would like it to go through [facility] care if possible.   Message left with Dr. Dwyane Dee to see if he wants me to giver her a free sample/train her on Tuesday, after seeing him.

## 2020-03-20 ENCOUNTER — Telehealth: Payer: Self-pay

## 2020-03-20 NOTE — Telephone Encounter (Signed)
CDE sent a message stating that the pt would like her [name] and corresponding supplies to be sent through [employer]. Contacted pt and left voicemail requesting a call back so it can be explained to her that she must first contact [employer] and set up and account. After this, Deanna Landry will contact this office and send forms via fax. They will be completed and then sent back. Once [place] receives these forms back from this office, they will contact the patient again and verify that this is correct and then set up a date/time for delivery of her supplies. Currently waiting on a callback from the patient.

## 2020-03-25 DIAGNOSIS — M6281 Muscle weakness (generalized): Secondary | ICD-10-CM | POA: Diagnosis not present

## 2020-03-25 DIAGNOSIS — S82851D Displaced trimalleolar fracture of right lower leg, subsequent encounter for closed fracture with routine healing: Secondary | ICD-10-CM | POA: Diagnosis not present

## 2020-03-25 DIAGNOSIS — S82871D Displaced pilon fracture of right tibia, subsequent encounter for closed fracture with routine healing: Secondary | ICD-10-CM | POA: Diagnosis not present

## 2020-03-26 ENCOUNTER — Encounter: Payer: Self-pay | Admitting: Endocrinology

## 2020-03-26 ENCOUNTER — Other Ambulatory Visit: Payer: Self-pay

## 2020-03-26 ENCOUNTER — Ambulatory Visit (INDEPENDENT_AMBULATORY_CARE_PROVIDER_SITE_OTHER): Payer: Medicare HMO | Admitting: Endocrinology

## 2020-03-26 VITALS — BP 140/70 | HR 76 | Ht 62.0 in | Wt 165.4 lb

## 2020-03-26 DIAGNOSIS — N182 Chronic kidney disease, stage 2 (mild): Secondary | ICD-10-CM

## 2020-03-26 DIAGNOSIS — E1065 Type 1 diabetes mellitus with hyperglycemia: Secondary | ICD-10-CM | POA: Diagnosis not present

## 2020-03-26 DIAGNOSIS — E1022 Type 1 diabetes mellitus with diabetic chronic kidney disease: Secondary | ICD-10-CM | POA: Diagnosis not present

## 2020-03-26 DIAGNOSIS — M25371 Other instability, right ankle: Secondary | ICD-10-CM | POA: Diagnosis not present

## 2020-03-26 LAB — GLUCOSE, POCT (MANUAL RESULT ENTRY): POC Glucose: 81 mg/dl (ref 70–99)

## 2020-03-26 NOTE — Progress Notes (Signed)
Patient ID: Deanna Landry, female   DOB: October 11, 1971, 49 y.o.   MRN: [id number]           Reason for Appointment : Follow-up for Type 1 Diabetes  History of Present Illness   Referring HCP:Lee, Keung         Diagnosis: Type 1 diabetes mellitus, date of diagnosis: Age 6         Previous history:   She has been on insulin since her diagnosis when she had significant symptomatic hypoglycemia. Has been on various types of insulin regimens in the past but usually not followed by endocrinologist The last 5 or 6 years she has been on Levemir and NovoLog Previous level of control is variable with A1c only inconsistently below 7  Recent history:   INSULIN regimen: BASAL insulin: Levemir 16-25 units twice daily Mealtime insulin: NovoLog 2-6 units before meals  Non-insulin hypoglycemic drugs: Trulicity 1.5 mg weekly  Most recent A1c 6.3 done on 02/02/2020  Current management, blood sugar patterns and problems identified:    She was not able to switch to [place] instead of Levemir and not clear why she has not had this authorized at the drugstore  Previously was taking Levemir once a day and now is taking twice daily  She still uses Levemir from syringes  She was told to take 16 units Levemir twice a day to start with and only adjust based on morning blood sugar  However she is adjusting her evening dose based on the blood sugar at the time of her injection  With this she has had one episode of severe hypoglycemia early morning; this was treated with glucagon injection given by her mother  Otherwise she thinks her blood sugars are mostly between 170-200 except lower at bedtime and may come down to 190-130 at times  Currently is using the same blood sugar meter as her mother  She was given a new Accu-Chek meter but she has not apparently been given this at the pharmacy  Also has not started freestyle libre as directed  She may not always get protein at meals and may just have some  vegetables and fruits  Today she took 2 units of insulin for blood sugar 174 without any food and blood sugar is not 81      Glucose monitoring:  is being done 4-5  times a day         Glucometer:  Accu-Chek/One Touch       Blood Glucose readings from recall as above:  Previous readings  PRE-MEAL Fasting Lunch Dinner Bedtime Overall  Glucose range: 47-120 200 85-250 98-220   Mean/median:         Hypoglycemia:  occurs mostly overnight Factors causing hyperglycemia: Excessive Levemir at night Symptoms of hypoglycemia:none or minimal Treatment of hypoglycemia: Juices, if patient is unconscious mother will call the ambulance         Self-care: The diet that the patient has been following is: None, does not know carbohydrate counting Immediately skipping breakfast, lunch will be assignments, dinner usually consists of bread or pasta Snacks will be peanut butter crackers, yogurt, cottage cheese, fruit Eating out only about once or twice a month   Mealtimes are: Breakfast none Lunch: 1 pm Dinner: 6pm         Exercise: None          Dietician consultation none.          Diabetes labs:  Lab Results  Component Value Date  HGBA1C 7.7 (H) 10/05/2019   HGBA1C 6.8 (H) 03/10/2019   Lab Results  Component Value Date   CREATININE 1.76 (H) 10/12/2019    No results found for: MICRALBCREAT   Allergies as of 03/26/2020   No Known Allergies     Medication List       Accurate as of Mar 26, 2020 10:55 AM. If you have any questions, ask your nurse or doctor.        Accu-Chek Aviva Plus test strip Generic drug: glucose blood USE TO CHECK BLOOD SUGAR 5 TIMES A DAY   Accu-Chek Aviva Plus w/Device Kit Use Accu Chek Aviva Plus meter to check blood sugar 5 times daily.   Accu-Chek FastClix Lancets Misc Use Accu Chek Fastclix to check blood sugar 5 times daily.   amLODipine 10 MG tablet Commonly known as: NORVASC Take 10 mg by mouth daily.   aspirin EC 81 MG tablet Take 81 mg  by mouth daily.   atorvastatin 80 MG tablet Commonly known as: LIPITOR Take 80 mg by mouth daily.   buPROPion 300 MG 24 hr tablet Commonly known as: WELLBUTRIN XL Take 300 mg by mouth daily.   clopidogrel 75 MG tablet Commonly known as: PLAVIX Take 75 mg by mouth daily.   Gvoke HypoPen 2-Pack 1 MG/0.2ML Soaj Generic drug: Glucagon Inject 1 mg into the skin as needed. For severe lows   isosorbide dinitrate 20 MG tablet Commonly known as: ISORDIL Take 20 mg by mouth 2 (two) times daily.   levothyroxine 50 MCG tablet Commonly known as: SYNTHROID Take 50 mcg by mouth daily.   metoprolol tartrate 25 MG tablet Commonly known as: LOPRESSOR Take 12.5 mg by mouth 2 (two) times daily.   NovoLOG FlexPen 100 UNIT/ML FlexPen Generic drug: insulin aspart Inject 4-10 Units into the skin 3 (three) times daily.   pantoprazole 40 MG tablet Commonly known as: PROTONIX Take 40 mg by mouth daily.   pregabalin 75 MG capsule Commonly known as: LYRICA Take 75 mg by mouth 2 (two) times daily.   sertraline 100 MG tablet Commonly known as: ZOLOFT Take 100 mg by mouth daily.   Systane 0.4-0.3 % Gel ophthalmic gel Generic drug: Polyethyl Glycol-Propyl Glycol Place 1 application into both eyes 3 (three) times daily as needed (dry eye).   [name] FlexTouch 200 UNIT/ML FlexTouch Pen Generic drug: insulin degludec Inject 30 Units into the skin daily.   Trulicity 1.5 XF/8.1WE Sopn Generic drug: Dulaglutide Inject 1.5 mg into the skin every Monday.       Allergies: No Known Allergies  Past Medical History:  Diagnosis Date  . CHF (congestive heart failure) ([place])   . Diabetes mellitus without complication ([place])   . Hypertension   . Myocardial infarct, old   . Renal disorder   . Stroke [facility])     Past Surgical History:  Procedure Laterality Date  . ABDOMINAL HYSTERECTOMY    . APPLICATION OF WOUND VAC Right 10/11/2019   Procedure: WOUND VAC CHANGE TO RIGHT LEG;  Surgeon: Shona Needles, MD;  Location: [facility];  Service: Orthopedics;  Laterality: Right;  . EYE SURGERY    . HARDWARE REMOVAL Right 10/06/2019   Procedure: HARDWARE REMOVAL;  Surgeon: Shona Needles, MD;  Location: [place];  Service: Orthopedics;  Laterality: Right;  . I & D EXTREMITY Right 10/06/2019   Procedure: IRRIGATION AND DEBRIDEMENT EXTREMITY;  Surgeon: Shona Needles, MD;  Location: [place];  Service: Orthopedics;  Laterality: Right;  . I &  D EXTREMITY Right 10/09/2019   Procedure: IRRIGATION AND DEBRIDEMENT EXTREMITY and WOUND VAC CHANGE RIGHT ANKLE;  Surgeon: Shona Needles, MD;  Location: [place];  Service: Orthopedics;  Laterality: Right;  . ORIF ANKLE FRACTURE Right 07/26/2019   Procedure: OPEN REDUCTION INTERNAL FIXATION (ORIF) ANKLE FRACTURE;  Surgeon: Shona Needles, MD;  Location: [place];  Service: Orthopedics;  Laterality: Right;  OPEN REDUCTION INTERNAL FIXATION (ORIF) ANKLE FRACTURE   . TOE AMPUTATION      Family History  Problem Relation Age of Onset  . Diabetes Mellitus II Mother   . Stroke Mother   . Heart attack Mother   . Stomach cancer Father   . Congestive Heart Failure Father   . Diabetes Sister   . Congestive Heart Failure Maternal Grandfather     Social History:  reports that she has never smoked. She has never used smokeless tobacco. She reports previous alcohol use. She reports previous drug use.      Review of Systems   HYPOTHYROIDISM: Treated by PCP with levothyroxine 50 mcg and last TSH 4.1 done in 12/20      Lipids: Last LDL was 80 done in 11/2019, on statin drug using 80 mg Lipitor  No results found for: CHOL, HDL, LDLCALC, LDLDIRECT, TRIG, CHOLHDL  Last dilated eye exam was in 5/20  Okay neuropathy: She will get some pains and paresthesia in her legs treated with Lyrica and tramadol.  Also has numbness in her hands  DIABETES COMPLICATIONS: Neuropathy, retinopathy nephropathy    LABS:  Office Visit on 03/26/2020  Component Date Value Ref Range Status    . POC Glucose 03/26/2020 81  70 - 99 mg/dl Final    Physical Examination:  BP 140/70 (BP Location: Left Arm, Patient Position: Sitting, Cuff Size: Normal)   Pulse 76   Ht '5\' 2"'$  (1.575 m)   Wt 165 lb 6.4 oz (75 kg)   LMP  (LMP Unknown) Comment: hysterectomy 2014  SpO2 98%   BMI 30.25 kg/m     ASSESSMENT:  Diabetes type 1, on basal bolus insulin with last A1c 6.3  Problems identified:  Unable to review blood sugar records and blood sugar history is available only by phone  She is inappropriately adjusting her Levemir especially at night based on her bedtime reading  Although hypoglycemia has been less her only episode likely was from taking too much Levemir  She is likely not getting enough basal insulin during the day and possibly also mealtime insulin  She is not taking proper correction doses and not basing it on any correction factor  Not doing any carbohydrate counting for mealtime coverage  Needs continuous glucose monitoring  Complications: Retinopathy, neuropathy, nephropathy  PLAN:   1. Glucose monitoring: . Patient advised to get the meter that was prescribed and needs to use the Accu-Chek Aviva . Needs to start using the freestyle [name], she will request this from DME supplier and try to also see the nurse educator if possible to start with a sample  2.  Diabetes education: . Patient will need detailed diabetes education including carbohydrate counting and needs to keep her appointment  3.  Lifestyle changes: . Dietary changes: Add a protein with every meal   4.  Insulin changes prescribed: . Switch Levemir to TRESIBA 30 units daily in the evenings . She will try to get this from the pharmacy and find out why it is not covered . If not covered will switch to [place] . Call if having  any hypoglycemia repeatedly at the same time . Given instructions on how to titrate this every 3 days by 2 units to keep morning sugars between 90-130.  Given flowsheet to  help adjust the dose of Tresiba based on fasting blood sugars.  Information on [place] given . She will take 4-8 units of NovoLog before meals based on meal size . Correction factor: Increase the dose by 1 unit for every 50 mg over 150   5. Follow-up: 1 month  There are no Patient Instructions on file for this visit.    Elayne Snare 03/26/2020, 10:55 AM      Note: This note was prepared with Dragon voice recognition system technology. Any transcriptional errors that result from this process are unintentional.

## 2020-03-26 NOTE — Patient Instructions (Signed)
Levemir 20 units and 14 units at nite  Novolog 4-8 units based on meal size  Protein at each meal

## 2020-03-27 ENCOUNTER — Telehealth: Payer: Self-pay | Admitting: Nutrition

## 2020-03-27 ENCOUNTER — Other Ambulatory Visit: Payer: Self-pay

## 2020-03-27 ENCOUNTER — Encounter: Payer: Medicare HMO | Attending: Endocrinology | Admitting: Nutrition

## 2020-03-27 NOTE — Telephone Encounter (Signed)
Patient came into the office from Dr. Ronnie Derby office wanting to be trained on the [place] 2.  Blood sugar was low, patient said blood sugar was 52, and she was eating her 2nd glucose tablet.   I told her that I did not have a free sample, but would have it tomorrow after lunch.  They agreed to come back for a training with the free sensor tomorrow.   He blood sugar was 70, and patient reported not feeling much better.  She was given 6 ounces of soda, and after 20 minutes, said she felt better.  Blood sugar was 122.

## 2020-03-27 NOTE — Telephone Encounter (Signed)
Patient reports that she was sent up her to start [place].  She was feeling extremely weak, and sweaty.  She said she tested her blood sugar and it was 52.  She was eating her second glucose tablet.  I did not have any free samples, and she was told that if she comes back tomorrow, I will have some available to give her.  She and her mother agreed to do this. After 15 min. Her blood sugar was 70, and she reported that she was not feeling much better.  She was given a 6 ounces of soda and after 15 min. Said she was feeling better.  Mother agreed to feed her lunch as soon as they got home.

## 2020-03-27 NOTE — Progress Notes (Addendum)
Patient is here with her mother, and identified by name and DOB.  She and her mother were given a sample from Dr. Ronnie Derby office and brought It up to be trained on how to use the [place] sensor.  We discussed the difference between sensor readings and blood sugar readings and they reported good understanding of this.  She attached the sensor to her left inner arm and started the sensor without any difficulty.  Her insurance was called ,and it was determined that she can use [facility] as her DME supplier.  Message sent to [facility] to fax order to them for sensors.  They had no final questions.

## 2020-03-28 ENCOUNTER — Telehealth: Payer: Self-pay

## 2020-03-28 NOTE — Telephone Encounter (Signed)
Written order form for freestyle [name] has been filled out along with snapshot of pt information, last office note, and insurance info. Please see order form to confirm information is correct, sign, and then date. Once completed, the referral and written order will be faxed to [place] supply.

## 2020-03-28 NOTE — Telephone Encounter (Signed)
-----   Message from Elayne Snare, MD sent at 03/27/2020  4:10 PM EDT ----- Regarding: RE: [name] sensors [place] please send the message To Olen Cordial directly for any prescription needsI presume we need to print out the forms for faxing to [facility] that you have in the email.  I do not think we can send to [facility] directly on epic Also we need to document this information in the patient chart, please send these messages on telephone encounters ----- Message ----- From: Ocie Doyne, RN Sent: 03/27/2020   2:23 PM EDT To: Elayne Snare, MD Subject: [name] sensors                                  Please send orders for [facility] sensors to [place] for her.  She was started today on her [name] 2 without difficulty.

## 2020-03-29 DIAGNOSIS — N183 Chronic kidney disease, stage 3 unspecified: Secondary | ICD-10-CM | POA: Diagnosis not present

## 2020-03-29 DIAGNOSIS — J449 Chronic obstructive pulmonary disease, unspecified: Secondary | ICD-10-CM | POA: Diagnosis not present

## 2020-03-29 DIAGNOSIS — G5603 Carpal tunnel syndrome, bilateral upper limbs: Secondary | ICD-10-CM | POA: Diagnosis not present

## 2020-03-29 DIAGNOSIS — I679 Cerebrovascular disease, unspecified: Secondary | ICD-10-CM | POA: Diagnosis not present

## 2020-03-29 DIAGNOSIS — I2510 Atherosclerotic heart disease of native coronary artery without angina pectoris: Secondary | ICD-10-CM | POA: Diagnosis not present

## 2020-03-29 DIAGNOSIS — F419 Anxiety disorder, unspecified: Secondary | ICD-10-CM | POA: Diagnosis not present

## 2020-03-29 DIAGNOSIS — E118 Type 2 diabetes mellitus with unspecified complications: Secondary | ICD-10-CM | POA: Diagnosis not present

## 2020-03-29 DIAGNOSIS — M159 Polyosteoarthritis, unspecified: Secondary | ICD-10-CM | POA: Diagnosis not present

## 2020-03-29 DIAGNOSIS — D649 Anemia, unspecified: Secondary | ICD-10-CM | POA: Diagnosis not present

## 2020-04-02 NOTE — Telephone Encounter (Signed)
Patient called today to see if the paperwork for the [facility] has been sent-please contact the patient with this information as soon as possible-good call back number for her is [phone]

## 2020-04-03 NOTE — Telephone Encounter (Signed)
Paperwork has been sent, but continue to get "NG" from the fax machine. Will attempt again. Called pt and left voicemail informing her of this. Requested that she call back if she should have further questions.

## 2020-04-09 NOTE — Telephone Encounter (Signed)
Paperwork is still not going through on fax machine. I have reached out to the rep for [facility] supply for assistance in this matter.

## 2020-04-09 NOTE — Telephone Encounter (Signed)
Patient called saying today her sensor goes out and she was calling to follow up on the paperwork. Please advise. Patient would like to be called at [phone]

## 2020-04-11 NOTE — Telephone Encounter (Signed)
Patient calling to check status of paperwork - told her what the last note from 5/25 said and she was wondering if we had heard anything from them she said she has gone 2 days without sensors. Patients # is [phone]

## 2020-04-11 NOTE — Telephone Encounter (Signed)
Called pt and left a detailed voicemail informing her that I have reached out the the [employer] rep to verify that the fax was received. There is a fax confirmation stating that it went through.

## 2020-04-12 DIAGNOSIS — E669 Obesity, unspecified: Secondary | ICD-10-CM | POA: Diagnosis not present

## 2020-04-12 DIAGNOSIS — M159 Polyosteoarthritis, unspecified: Secondary | ICD-10-CM | POA: Diagnosis not present

## 2020-04-12 DIAGNOSIS — J449 Chronic obstructive pulmonary disease, unspecified: Secondary | ICD-10-CM | POA: Diagnosis not present

## 2020-04-12 DIAGNOSIS — Z6830 Body mass index (BMI) 30.0-30.9, adult: Secondary | ICD-10-CM | POA: Diagnosis not present

## 2020-04-12 DIAGNOSIS — E118 Type 2 diabetes mellitus with unspecified complications: Secondary | ICD-10-CM | POA: Diagnosis not present

## 2020-04-12 DIAGNOSIS — G5603 Carpal tunnel syndrome, bilateral upper limbs: Secondary | ICD-10-CM | POA: Diagnosis not present

## 2020-04-12 DIAGNOSIS — N183 Chronic kidney disease, stage 3 unspecified: Secondary | ICD-10-CM | POA: Diagnosis not present

## 2020-04-14 DIAGNOSIS — I5042 Chronic combined systolic (congestive) and diastolic (congestive) heart failure: Secondary | ICD-10-CM | POA: Diagnosis not present

## 2020-04-18 ENCOUNTER — Telehealth: Payer: Self-pay

## 2020-04-18 DIAGNOSIS — R404 Transient alteration of awareness: Secondary | ICD-10-CM | POA: Diagnosis not present

## 2020-04-18 DIAGNOSIS — E161 Other hypoglycemia: Secondary | ICD-10-CM | POA: Diagnosis not present

## 2020-04-18 DIAGNOSIS — R4020 Unspecified coma: Secondary | ICD-10-CM | POA: Diagnosis not present

## 2020-04-18 DIAGNOSIS — R52 Pain, unspecified: Secondary | ICD-10-CM | POA: Diagnosis not present

## 2020-04-18 DIAGNOSIS — E162 Hypoglycemia, unspecified: Secondary | ICD-10-CM | POA: Diagnosis not present

## 2020-04-18 NOTE — Telephone Encounter (Signed)
FAXED [place]: [facility]  Document: Detailed Written Order for CGM Other records requested: none at this time.  All above requested information has been faxed successfully to [employer] listed above. Documents and fax confirmation have been placed in the faxed file for future reference.

## 2020-04-22 ENCOUNTER — Other Ambulatory Visit: Payer: Self-pay

## 2020-04-22 NOTE — Patient Outreach (Signed)
[facility] [facility]) Care Management  04/22/2020  [place] D Avina [birth year] [id number]   Telephone Assessment   Outreach attempt to patient. No answer at present.      Plan: RN CM will make outreach attempt to patient within the month of August.    Minami Arriaga [facility] Management Telephonic Care Management Coordinator Direct Phone: [phone] Toll Free: [phone] Fax: [phone]

## 2020-04-25 DIAGNOSIS — S82851D Displaced trimalleolar fracture of right lower leg, subsequent encounter for closed fracture with routine healing: Secondary | ICD-10-CM | POA: Diagnosis not present

## 2020-04-25 DIAGNOSIS — S82871D Displaced pilon fracture of right tibia, subsequent encounter for closed fracture with routine healing: Secondary | ICD-10-CM | POA: Diagnosis not present

## 2020-04-25 DIAGNOSIS — M6281 Muscle weakness (generalized): Secondary | ICD-10-CM | POA: Diagnosis not present

## 2020-04-26 ENCOUNTER — Ambulatory Visit (INDEPENDENT_AMBULATORY_CARE_PROVIDER_SITE_OTHER): Payer: Medicare HMO | Admitting: Endocrinology

## 2020-04-26 ENCOUNTER — Other Ambulatory Visit: Payer: Self-pay

## 2020-04-26 ENCOUNTER — Encounter: Payer: Self-pay | Admitting: Endocrinology

## 2020-04-26 VITALS — BP 100/70 | HR 111 | Ht 62.0 in | Wt 167.0 lb

## 2020-04-26 DIAGNOSIS — N182 Chronic kidney disease, stage 2 (mild): Secondary | ICD-10-CM

## 2020-04-26 DIAGNOSIS — E1022 Type 1 diabetes mellitus with diabetic chronic kidney disease: Secondary | ICD-10-CM

## 2020-04-26 DIAGNOSIS — E1065 Type 1 diabetes mellitus with hyperglycemia: Secondary | ICD-10-CM

## 2020-04-26 LAB — POCT GLYCOSYLATED HEMOGLOBIN (HGB A1C): Hemoglobin A1C: 7.1 % — AB (ref 4.0–5.6)

## 2020-04-26 MED ORDER — GVOKE HYPOPEN 2-PACK 1 MG/0.2ML ~~LOC~~ SOAJ: 1.0000 mg | SUBCUTANEOUS | 3 refills | Status: DC | PRN

## 2020-04-26 NOTE — Patient Instructions (Addendum)
Tresiba 15 units daily  Stop Trulicity

## 2020-04-26 NOTE — Progress Notes (Addendum)
Patient ID: Deanna Landry, female   DOB: 01/10/1971, 49 y.o.   MRN: [id number]           Reason for Appointment : Follow-up for Type 1 Diabetes  History of Present Illness   Referring HCP:Deanna Landry         Diagnosis: Type 1 diabetes mellitus, date of diagnosis: Age 42         Previous history:   She has been on insulin since her diagnosis when she had significant symptomatic hypoglycemia. Has been on various types of insulin regimens in the past but usually not followed by endocrinologist The last 5 or 6 years she has been on Levemir and NovoLog Previous level of control is variable with A1c only inconsistently below 7  Recent history:   INSULIN regimen:  BASAL insulin: 18 units Tresiba daily  Mealtime insulin: NovoLog Landry-6 units before meals  Non-insulin hypoglycemic drugs: Trulicity 1.5 mg weekly  A1c is 7.1 compared to 6.3 done on 02/02/2020  Current management, blood sugar patterns and problems identified:    She was finally able to switch to [place] instead of Levemir; although previously was taking on 36 units of Levemir today she is only taking half the amount of [place] because of tendency to low sugars  She was using the CGM until about Landry weeks ago when she did not have any refills on her sensors  Currently using Deanna but sharing this with her mother and not able to get her recent blood sugars  However she was having excessive hypoglycemia even after switching to 18 units [place] about 3 weeks ago  Also at times would have high sugars during the night likely to be from more snacking for fear of hypoglycemia  She has had a couple of significant low sugars early morning including the last couple of weeks requiring glucagon  Also this morning her sugar was only 64  She has not reduced her Deanna Landry further at this point low sugars  She is not having much appetite and taking only about Landry units of insulin to cover her meals  Last night had hamburger with one half  hamburger bun and took Landry units with postprandial reading 131  Otherwise unclear what her postprandial readings are, does not appear to have hyperglycemia usually   CONTINUOUS GLUCOSE MONITORING RECORD INTERPRETATION    Dates of Recording: 5/13-5/26/21  Sensor description:Deanna Landry  Results statistics:   CGM use % of time  98  Average and SD  148+/-50  Time in range      55%  % Time Above 180  18  % Time above 250  12  % Time Below target  15    PRE-MEAL Fasting Lunch Dinner Bedtime Overall  Glucose range:       Mean/median:  136  138  153  123    POST-MEAL PC Breakfast PC Lunch PC Dinner  Glucose range:     Mean/median:  112  153  144    Glycemic patterns summary: Blood sugars show significant variability throughout the day especially overnight Day-to-day variability is also significant with overall highest blood sugars between 12-Landry AM but not consistently Lowest blood sugars are between 10 AM-12 PM Hypoglycemia occurring on most of the days at various times  Hyperglycemic episodes occurred sporadically at different times including overnight and sometimes after rebounding from low sugars Blood sugars generally would be rising after 9 PM  Hypoglycemic episodes occurred most frequently before noon time with 27 episodes in  total  Overnight periods: Blood sugars are highly variable with starting an average of 180 and then gradually coming down with hypoglycemia early morning or during the night about half the time   Preprandial periods: Blood sugars are quite variable with no consistent pattern at all times but on an average higher at dinnertime, averaging about 150  Postprandial periods:   Difficult to assess since mealtimes are inconsistent with more consistent hyperglycemia seen except occasionally after lunch    Previous readings  PRE-MEAL Fasting Lunch Dinner Bedtime Overall  Glucose range: 47-120 200 85-250 98-220   Mean/median:         Hypoglycemia:  occurs  mostly overnight Factors causing hyperglycemia: Excessive Levemir at night Symptoms of hypoglycemia:none or minimal Treatment of hypoglycemia: Juices, if patient is unconscious mother will call the ambulance         Self-care: The diet that the patient has been following is: None, does not know carbohydrate counting Immediately skipping breakfast, lunch will be assignments, dinner usually consists of bread or pasta Snacks will be peanut butter crackers, yogurt, cottage cheese, fruit Eating out only about once or twice a month   Mealtimes are: Breakfast none Lunch: 1 pm Dinner: 6pm         Exercise: None          Dietician consultation none.          Diabetes labs:  Lab Results  Component Value Date   HGBA1C 7.1 (A) 04/26/2020   HGBA1C 7.7 (H) 10/05/2019   HGBA1C 6.8 (H) 03/10/2019   Lab Results  Component Value Date   CREATININE 1.76 (H) 10/12/2019    No results found for: MICRALBCREAT   Allergies as of 04/26/2020   No Known Allergies     Medication List       Accurate as of April 26, 2020 11:30 AM. If you have any questions, ask your nurse or doctor.        Deanna Landry test strip Generic drug: glucose blood USE TO CHECK BLOOD SUGAR 5 TIMES A DAY   Deanna Landry w/Device Kit Use Accu Chek Aviva Landry meter to check blood sugar 5 times daily.   Deanna FastClix Lancets Misc Use Accu Chek Fastclix to check blood sugar 5 times daily.   amLODipine 10 MG tablet Commonly known as: NORVASC Take 10 mg by mouth daily.   aspirin EC 81 MG tablet Take 81 mg by mouth daily.   atorvastatin 80 MG tablet Commonly known as: LIPITOR Take 80 mg by mouth daily.   buPROPion 300 MG 24 hr tablet Commonly known as: WELLBUTRIN XL Take 300 mg by mouth daily.   clopidogrel 75 MG tablet Commonly known as: PLAVIX Take 75 mg by mouth daily.   Gvoke HypoPen Landry-Pack 1 MG/0.2ML Soaj Generic drug: Glucagon Inject 1 mg into the skin as needed. For severe lows     isosorbide dinitrate 20 MG tablet Commonly known as: ISORDIL Take 20 mg by mouth Landry (two) times daily.   levothyroxine 50 MCG tablet Commonly known as: SYNTHROID Take 50 mcg by mouth daily.   metoprolol tartrate 25 MG tablet Commonly known as: LOPRESSOR Take 12.5 mg by mouth Landry (two) times daily.   NovoLOG FlexPen 100 UNIT/ML FlexPen Generic drug: insulin aspart Inject 4-10 Units into the skin 3 (three) times daily.   pantoprazole 40 MG tablet Commonly known as: PROTONIX Take 40 mg by mouth daily.   pregabalin 75 MG capsule Commonly known as: LYRICA Take 75  mg by mouth Landry (two) times daily.   sertraline 100 MG tablet Commonly known as: ZOLOFT Take 100 mg by mouth daily.   Systane 0.4-0.3 % Gel ophthalmic gel Generic drug: Polyethyl Glycol-Propyl Glycol Place 1 application into both eyes 3 (three) times daily as needed (dry eye).   Deanna Landry FlexTouch 200 UNIT/ML FlexTouch Pen Generic drug: insulin degludec Inject 30 Units into the skin daily. What changed: how much to take   Trulicity 1.5 UE/Landry.8MK Sopn Generic drug: Dulaglutide Inject 1.5 mg into the skin every Monday.       Allergies: No Known Allergies  Past Medical History:  Diagnosis Date  . CHF (congestive heart failure) ([place])   . Diabetes mellitus without complication ([place])   . Hypertension   . Myocardial infarct, old   . Renal disorder   . Stroke [facility])     Past Surgical History:  Procedure Laterality Date  . ABDOMINAL HYSTERECTOMY    . APPLICATION OF WOUND VAC Right 10/11/2019   Procedure: WOUND VAC CHANGE TO RIGHT LEG;  Surgeon: Shona Needles, MD;  Location: [place];  Service: Orthopedics;  Laterality: Right;  . EYE SURGERY    . HARDWARE REMOVAL Right 10/06/2019   Procedure: HARDWARE REMOVAL;  Surgeon: Shona Needles, MD;  Location: [place];  Service: Orthopedics;  Laterality: Right;  . I & D EXTREMITY Right 10/06/2019   Procedure: IRRIGATION AND DEBRIDEMENT EXTREMITY;  Surgeon: Shona Needles,  MD;  Location: [place];  Service: Orthopedics;  Laterality: Right;  . I & D EXTREMITY Right 10/09/2019   Procedure: IRRIGATION AND DEBRIDEMENT EXTREMITY and WOUND VAC CHANGE RIGHT ANKLE;  Surgeon: Shona Needles, MD;  Location: [place];  Service: Orthopedics;  Laterality: Right;  . ORIF ANKLE FRACTURE Right 07/26/2019   Procedure: OPEN REDUCTION INTERNAL FIXATION (ORIF) ANKLE FRACTURE;  Surgeon: Shona Needles, MD;  Location: [place];  Service: Orthopedics;  Laterality: Right;  OPEN REDUCTION INTERNAL FIXATION (ORIF) ANKLE FRACTURE   . TOE AMPUTATION      Family History  Problem Relation Age of Onset  . Diabetes Mellitus II Mother   . Stroke Mother   . Heart attack Mother   . Stomach cancer Father   . Congestive Heart Failure Father   . Diabetes Sister   . Congestive Heart Failure Maternal Grandfather     Social History:  reports that she has never smoked. She has never used smokeless tobacco. She reports previous alcohol use. She reports previous drug use.      Review of Systems  HYPOTHYROIDISM: Treated by PCP with levothyroxine 50 mcg and last TSH 4.1 done in 12/20      Lipids: Last LDL was 80 done in 11/2019, on statin drug using 80 mg Lipitor  No results found for: CHOL, HDL, LDLCALC, LDLDIRECT, TRIG, CHOLHDL  Last dilated eye exam was in 5/20  Peripheral neuropathy: She will get some pains and paresthesia in her legs treated with Lyrica and tramadol.  Also has numbness in her hands  DIABETES COMPLICATIONS: Neuropathy, retinopathy nephropathy    LABS:  Office Visit on 04/26/2020  Component Date Value Ref Range Status  . Hemoglobin A1C 04/26/2020 7.1* 4.0 - 5.6 % Final    Physical Examination:  BP 100/70   Pulse (!) 111   Ht 5' Landry" (1.575 m)   Wt 167 lb (75.8 kg)   LMP  (LMP Unknown) Comment: hysterectomy 2014  SpO2 99%   BMI 30.54 kg/m     ASSESSMENT:  Diabetes type  1, on basal bolus insulin  Problems identified:  Unable to review blood sugar records after  5/26  She is able to use the freestyle Deanna last month and this is reportedly accurate but not clear why she did not have regular refills  With taking Deanna Landry she is requiring much less basal insulin but also her appetite is decreased  Currently taking relatively small doses of NovoLog at meals  Not doing any carbohydrate counting for mealtime coverage  Since she continues to have overnight hypoglycemia likely needs less than 18 units of Tresiba  Decreased appetite: Not clear if she needs to be on Trulicity especially with her diagnosis likely to be type I  HYPOTHYROIDISM we will need to recheck her labs for ruling out increasing hypothyroidism although she is only on 50 mcg supplement  PLAN:   1. Glucose monitoring: . She will try to get the refills of her Deanna sensors  Landry.  Diabetes education: Needs continued diabetes education  3. Insulin changes prescribed: . Deanna Landry will be reduced to 15 units . Given a flowsheet with instructions on how to adjust her dose by 1-Landry units every 3 days based on blood sugars in the mornings . Blood sugar target in the morning will be 100-140 . If her sugars are below 90-100 she will reduce the Tresiba by Landry units . Continue small doses of NovoLog and she can take the basic dose of Landry units with another unit for each starts . Would be able to guide her with her mealtime insulin better once we have the CGM going again . New prescription for glucagon given which has been used by her mother with success . Stop Trulicity as this is likely unnecessary for her diabetes and she does have excessively reduced appetite currently   5. Follow-up: 6 weeks  Patient Instructions  Deanna Landry 15 units daily  Stop Trulicity     Elayne Snare 04/26/2020, 11:30 AM      Note: This note was prepared with Dragon voice recognition system technology. Any transcriptional errors that result from this process are unintentional.

## 2020-04-29 ENCOUNTER — Other Ambulatory Visit: Payer: Self-pay

## 2020-04-29 DIAGNOSIS — J449 Chronic obstructive pulmonary disease, unspecified: Secondary | ICD-10-CM | POA: Diagnosis not present

## 2020-04-29 DIAGNOSIS — N183 Chronic kidney disease, stage 3 unspecified: Secondary | ICD-10-CM | POA: Diagnosis not present

## 2020-04-29 DIAGNOSIS — M159 Polyosteoarthritis, unspecified: Secondary | ICD-10-CM | POA: Diagnosis not present

## 2020-04-29 DIAGNOSIS — I679 Cerebrovascular disease, unspecified: Secondary | ICD-10-CM | POA: Diagnosis not present

## 2020-04-29 DIAGNOSIS — D649 Anemia, unspecified: Secondary | ICD-10-CM | POA: Diagnosis not present

## 2020-04-29 DIAGNOSIS — I2510 Atherosclerotic heart disease of native coronary artery without angina pectoris: Secondary | ICD-10-CM | POA: Diagnosis not present

## 2020-04-29 DIAGNOSIS — E118 Type 2 diabetes mellitus with unspecified complications: Secondary | ICD-10-CM | POA: Diagnosis not present

## 2020-04-29 DIAGNOSIS — E785 Hyperlipidemia, unspecified: Secondary | ICD-10-CM | POA: Diagnosis not present

## 2020-04-29 DIAGNOSIS — G5603 Carpal tunnel syndrome, bilateral upper limbs: Secondary | ICD-10-CM | POA: Diagnosis not present

## 2020-04-29 DIAGNOSIS — F419 Anxiety disorder, unspecified: Secondary | ICD-10-CM | POA: Diagnosis not present

## 2020-04-30 NOTE — Telephone Encounter (Signed)
I have reached out to the [facility] rep to inquire about what else is needed. I have double checked the form that was sent yesterday. It does have the MD signature and there are no spots left to be filled out. At this time, I am unsure as to what the patient is referring to.  I have also requested that [place] notify this office when something is needed rather than the patient. This office cannot fix any issues if we are not first notified.

## 2020-04-30 NOTE — Telephone Encounter (Signed)
Patient called stating the forms that were faxed to [facility] were missing a date and also the Dr's signature. Patient says she is out of sensors and requests we get that resent as soon as possible.

## 2020-05-02 DIAGNOSIS — E1065 Type 1 diabetes mellitus with hyperglycemia: Secondary | ICD-10-CM | POA: Diagnosis not present

## 2020-05-06 ENCOUNTER — Other Ambulatory Visit: Payer: Self-pay

## 2020-05-06 ENCOUNTER — Encounter: Payer: Self-pay | Admitting: Cardiology

## 2020-05-06 ENCOUNTER — Ambulatory Visit (INDEPENDENT_AMBULATORY_CARE_PROVIDER_SITE_OTHER): Payer: Medicare HMO | Admitting: Cardiology

## 2020-05-06 VITALS — BP 118/76 | HR 78 | Ht 62.0 in | Wt 169.4 lb

## 2020-05-06 DIAGNOSIS — I10 Essential (primary) hypertension: Secondary | ICD-10-CM

## 2020-05-06 DIAGNOSIS — E109 Type 1 diabetes mellitus without complications: Secondary | ICD-10-CM | POA: Diagnosis not present

## 2020-05-06 DIAGNOSIS — I2510 Atherosclerotic heart disease of native coronary artery without angina pectoris: Secondary | ICD-10-CM

## 2020-05-06 DIAGNOSIS — E785 Hyperlipidemia, unspecified: Secondary | ICD-10-CM

## 2020-05-06 DIAGNOSIS — M159 Polyosteoarthritis, unspecified: Secondary | ICD-10-CM | POA: Diagnosis not present

## 2020-05-06 DIAGNOSIS — D649 Anemia, unspecified: Secondary | ICD-10-CM | POA: Diagnosis not present

## 2020-05-06 DIAGNOSIS — J449 Chronic obstructive pulmonary disease, unspecified: Secondary | ICD-10-CM | POA: Diagnosis not present

## 2020-05-06 DIAGNOSIS — G5603 Carpal tunnel syndrome, bilateral upper limbs: Secondary | ICD-10-CM | POA: Diagnosis not present

## 2020-05-06 DIAGNOSIS — I679 Cerebrovascular disease, unspecified: Secondary | ICD-10-CM | POA: Diagnosis not present

## 2020-05-06 DIAGNOSIS — E118 Type 2 diabetes mellitus with unspecified complications: Secondary | ICD-10-CM | POA: Diagnosis not present

## 2020-05-06 DIAGNOSIS — N183 Chronic kidney disease, stage 3 unspecified: Secondary | ICD-10-CM | POA: Diagnosis not present

## 2020-05-06 DIAGNOSIS — L0390 Cellulitis, unspecified: Secondary | ICD-10-CM | POA: Diagnosis not present

## 2020-05-06 MED ORDER — RANOLAZINE ER 500 MG PO TB12
500.0000 mg | ORAL_TABLET | Freq: Two times a day (BID) | ORAL | 6 refills | Status: DC
Start: 2020-05-06 — End: 2020-11-01

## 2020-05-06 NOTE — Progress Notes (Signed)
Cardiology Office Note:    Date:  05/06/2020   ID:  [name], DOB 03-Sep-1971, MRN [id number]  PCP:  Deanna Nakai, MD  Cardiologist:  Deanna Campus, MD    Referring MD: Deanna Nakai, MD   Chief Complaint  Patient presents with   Follow-up    4 MO FU   Doing better  History of Present Illness:    Deanna Landry is a 49 y.o. female with incredible past medical history which include longstanding poorly controlled diabetes, in fall 2020 she ended up having DKA.  She was transferred to [facility] she was find to have acute coronary syndrome at that time.  Cardiac catheterization has been done and she got multiple stent placed in the right coronary artery.  Since that time she seems to be doing well.  She comes today to my office for diabetes much better controlled.  She is legally blind therefore she had difficulty walking outside but overall things seems to be improving.  Described to have episodes of chest pain that typically happen at rest.  She does not exercise to the level that we can judge her ability to exercise sufficiently.  We were thinking about potentially doing a stress test trying to clarify this however she prefers to just medical management for now.  Therefore, I will give her ranolazine 500 mg twice daily.  I will see her back in my office in about 3 months to see how she does.  Past Medical History:  Diagnosis Date   CHF (congestive heart failure) (HCC)    Diabetes mellitus without complication ([place])    Hypertension    Myocardial infarct, old    Renal disorder    Stroke [facility])     Past Surgical History:  Procedure Laterality Date   ABDOMINAL HYSTERECTOMY     APPLICATION OF WOUND VAC Right 10/11/2019   Procedure: WOUND VAC CHANGE TO RIGHT LEG;  Surgeon: Shona Needles, MD;  Location: [place];  Service: Orthopedics;  Laterality: Right;   EYE SURGERY     HARDWARE REMOVAL Right 10/06/2019   Procedure: HARDWARE REMOVAL;  Surgeon: Shona Needles, MD;  Location: [place];  Service: Orthopedics;  Laterality: Right;   I & D EXTREMITY Right 10/06/2019   Procedure: IRRIGATION AND DEBRIDEMENT EXTREMITY;  Surgeon: Shona Needles, MD;  Location: [place];  Service: Orthopedics;  Laterality: Right;   I & D EXTREMITY Right 10/09/2019   Procedure: IRRIGATION AND DEBRIDEMENT EXTREMITY and WOUND VAC CHANGE RIGHT ANKLE;  Surgeon: Shona Needles, MD;  Location: [place];  Service: Orthopedics;  Laterality: Right;   ORIF ANKLE FRACTURE Right 07/26/2019   Procedure: OPEN REDUCTION INTERNAL FIXATION (ORIF) ANKLE FRACTURE;  Surgeon: Shona Needles, MD;  Location: [place];  Service: Orthopedics;  Laterality: Right;  OPEN REDUCTION INTERNAL FIXATION (ORIF) ANKLE FRACTURE    TOE AMPUTATION      Current Medications: Current Meds  Medication Sig   ACCU-CHEK AVIVA PLUS test strip USE TO CHECK BLOOD SUGAR 5 TIMES A DAY   Accu-Chek FastClix Lancets MISC Use Accu Chek Fastclix to check blood sugar 5 times daily.   amLODipine (NORVASC) 10 MG tablet Take 10 mg by mouth daily.   aspirin EC 81 MG tablet Take 81 mg by mouth daily.   atorvastatin (LIPITOR) 80 MG tablet Take 80 mg by mouth daily.   Blood Glucose Monitoring Suppl (ACCU-CHEK AVIVA PLUS) w/Device KIT Use Accu Chek Aviva Plus meter to check blood sugar 5 times  daily.   buPROPion (WELLBUTRIN XL) 300 MG 24 hr tablet Take 300 mg by mouth daily.   clopidogrel (PLAVIX) 75 MG tablet Take 75 mg by mouth daily.   Glucagon (GVOKE HYPOPEN 2-PACK) 1 MG/0.2ML SOAJ Inject 1 mg into the skin as needed. For severe lows   insulin degludec (TRESIBA FLEXTOUCH) 200 UNIT/ML FlexTouch Pen Inject 30 Units into the skin daily. (Patient taking differently: Inject 18 Units into the skin daily. )   isosorbide dinitrate (ISORDIL) 20 MG tablet Take 20 mg by mouth 2 (two) times daily.   levothyroxine (SYNTHROID) 50 MCG tablet Take 50 mcg by mouth daily.   metoCLOPramide (REGLAN) 5 MG tablet Take 1 tablet by mouth daily.   metoprolol tartrate  (LOPRESSOR) 25 MG tablet Take 12.5 mg by mouth 2 (two) times daily.   NOVOLOG FLEXPEN 100 UNIT/ML FlexPen Inject 4-10 Units into the skin 3 (three) times daily.    pantoprazole (PROTONIX) 40 MG tablet Take 40 mg by mouth daily.   Polyethyl Glycol-Propyl Glycol (SYSTANE) 0.4-0.3 % GEL ophthalmic gel Place 1 application into both eyes 3 (three) times daily as needed (dry eye).   pregabalin (LYRICA) 75 MG capsule Take 75 mg by mouth 2 (two) times daily.   sertraline (ZOLOFT) 100 MG tablet Take 100 mg by mouth daily.   traMADol (ULTRAM) 50 MG tablet Take 1 tablet by mouth daily.   TRULICITY 1.5 DH/7.4BU SOPN Inject 1.5 mg into the skin every Monday.      Allergies:   Patient has no known allergies.   Social History   Socioeconomic History   Marital status: Divorced    Spouse name: Not on file   Number of children: Not on file   Years of education: Not on file   Highest education level: Not on file  Occupational History   Not on file  Tobacco Use   Smoking status: Never Smoker   Smokeless tobacco: Never Used  Vaping Use   Vaping Use: Never used  Substance and Sexual Activity   Alcohol use: Not Currently   Drug use: Not Currently   Sexual activity: Not on file  Other Topics Concern   Not on file  Social History Narrative   Not on file   Social Determinants of Health   Financial Resource Strain:    Difficulty of Paying Living Expenses:   Food Insecurity:    Worried About [place] in the Last Year:    Arboriculturist in the Last Year:   Transportation Needs: No Transportation Needs   Lack of Transportation (Medical): No   Lack of Transportation (Non-Medical): No  Physical Activity:    Days of Exercise per Week:    Minutes of Exercise per Session:   Stress:    Feeling of Stress :   Social Connections:    Frequency of Communication with Friends and Family:    Frequency of Social Gatherings with Friends and Family:    Attends  Religious Services:    Active Member of Clubs or Organizations:    Attends Music therapist:    Marital Status:      Family History: The patient's family history includes Congestive Heart Failure in her father and maternal grandfather; Diabetes in her sister; Diabetes Mellitus II in her mother; Heart attack in her mother; Stomach cancer in her father; Stroke in her mother. ROS:   Please see the history of present illness.    All 14 point review of systems negative except  as described per history of present illness  EKGs/Labs/Other Studies Reviewed:      Recent Labs: 10/03/2019: ALT 11 10/12/2019: BUN 20; Creatinine, Ser 1.76; Hemoglobin 7.3; Platelets 258; Potassium 5.2; Sodium 139  Recent Lipid Panel No results found for: CHOL, TRIG, HDL, CHOLHDL, VLDL, LDLCALC, LDLDIRECT  Physical Exam:    VS:  BP 118/76 (BP Location: Left Arm, Patient Position: Sitting, Cuff Size: Normal)    Pulse 78    Ht 5' 2" (1.575 m)    Wt 169 lb 6.4 oz (76.8 kg)    LMP  (LMP Unknown) Comment: hysterectomy 2014   SpO2 99%    BMI 30.98 kg/m     Wt Readings from Last 3 Encounters:  05/06/20 169 lb 6.4 oz (76.8 kg)  04/26/20 167 lb (75.8 kg)  03/26/20 165 lb 6.4 oz (75 kg)     GEN:  Well nourished, well developed in no acute distress HEENT: Normal NECK: No JVD; No carotid bruits LYMPHATICS: No lymphadenopathy CARDIAC: RRR, no murmurs, no rubs, no gallops RESPIRATORY:  Clear to auscultation without rales, wheezing or rhonchi  ABDOMEN: Soft, non-tender, non-distended MUSCULOSKELETAL:  No edema; No deformity  SKIN: Warm and dry LOWER EXTREMITIES: no swelling NEUROLOGIC:  Alert and oriented x 3 PSYCHIATRIC:  Normal affect   ASSESSMENT:    1. Coronary artery disease involving native coronary artery of native heart without angina pectoris   2. Essential hypertension   3. Dyslipidemia   4. Type 1 diabetes mellitus without complication (HCC)    PLAN:    In order of problems listed  above:  1. Coronary disease some atypical symptoms I will start ranolazine and see her back in about 3 months to reevaluate.  She may require stress testing. 2. Essential hypertension blood pressure seems to be well controlled today on a 28/80 we will continue present management. 3. Dyslipidemia: She is on statin I did review her K PN which showed excellent cholesterol.  We will continue present management 4. Type 2 diabetes, will continue present management. We did review echocardiogram showed preserved left ventricle ejection fraction as well as carotic ultrasounds with did not show any significant cardiac arterial stenosis.  Medication Adjustments/Labs and Tests Ordered: Current medicines are reviewed at length with the patient today.  Concerns regarding medicines are outlined above.  No orders of the defined types were placed in this encounter.  Medication changes: No orders of the defined types were placed in this encounter.   Signed, Park Liter, MD, [facility] 05/06/2020 12:08 PM    [place]

## 2020-05-06 NOTE — Patient Instructions (Addendum)
Medication Instructions:  Your physician has recommended you make the following change in your medication:   Start Ranexa 500 mg twice daily.  *If you need a refill on your cardiac medications before your next appointment, please call your pharmacy*   Lab Work: None ordered If you have labs (blood work) drawn today and your tests are completely normal, you will receive your results only by: Marland Kitchen MyChart Message (if you have MyChart) OR . A paper copy in the mail If you have any lab test that is abnormal or we need to change your treatment, we will call you to review the results.   Testing/Procedures: None ordered   Follow-Up: At [facility], you and your health needs are our priority.  As part of our continuing mission to provide you with exceptional heart care, we have created designated Provider Care Teams.  These Care Teams include your primary Cardiologist (physician) and Advanced Practice Providers (APPs -  Physician Assistants and Nurse Practitioners) who all work together to provide you with the care you need, when you need it.  We recommend signing up for the patient portal called "MyChart".  Sign up information is provided on this After Visit Summary.  MyChart is used to connect with patients for Virtual Visits (Telemedicine).  Patients are able to view lab/test results, encounter notes, upcoming appointments, etc.  Non-urgent messages can be sent to your provider as well.   To learn more about what you can do with MyChart, go to [email or web address].    Your next appointment:   2 month(s)  The format for your next appointment:   In Person  Provider:   Jenne Campus, MD   Other Instructions Ranolazine tablets, extended release What is this medicine? RANOLAZINE (ra NOE la zeen) is a heart medicine. It is used to treat chronic chest pain (angina). This medicine must be taken regularly. It will not relieve an acute episode of chest pain. This medicine may be used  for other purposes; ask your health care provider or pharmacist if you have questions. COMMON BRAND NAME(S): Ranexa What should I tell my health care provider before I take this medicine? They need to know if you have any of these conditions:  heart disease  irregular heartbeat  kidney disease  liver disease  low levels of potassium or magnesium in the blood  an unusual or allergic reaction to ranolazine, other medicines, foods, dyes, or preservatives  pregnant or trying to get pregnant  breast-feeding How should I use this medicine? Take this medicine by mouth with a glass of water. Follow the directions on the prescription label. Do not cut, crush, or chew this medicine. Take with or without food. Do not take this medication with grapefruit juice. Take your doses at regular intervals. Do not take your medicine more often then directed. Talk to your pediatrician regarding the use of this medicine in children. Special care may be needed. Overdosage: If you think you have taken too much of this medicine contact a poison control center or emergency room at once. NOTE: This medicine is only for you. Do not share this medicine with others. What if I miss a dose? If you miss a dose, take it as soon as you can. If it is almost time for your next dose, take only that dose. Do not take double or extra doses. What may interact with this medicine? Do not take this medicine with any of the following medications:  antivirals for HIV or AIDS  cerivastatin  certain antibiotics like chloramphenicol, clarithromycin, dalfopristin; quinupristin, isoniazid, rifabutin, rifampin, rifapentine  certain medicines used for cancer like imatinib, nilotinib  certain medicines for fungal infections like fluconazole, itraconazole, ketoconazole, posaconazole, voriconazole  certain medicines for irregular heart beat like dronedarone  certain medicines for seizures like carbamazepine, fosphenytoin,  oxcarbazepine, phenobarbital, phenytoin  cisapride  conivaptan  cyclosporine  grapefruit or grapefruit juice  lumacaftor; ivacaftor  nefazodone  pimozide  quinacrine  St John's wort  thioridazine This medicine may also interact with the following medications:  alfuzosin  certain medicines for depression, anxiety, or psychotic disturbances like bupropion, citalopram, fluoxetine, fluphenazine, paroxetine, perphenazine, risperidone, sertraline, trifluoperazine  certain medicines for cholesterol like atorvastatin, lovastatin, simvastatin  certain medicines for stomach problems like octreotide, palonosetron, prochlorperazine  eplerenone  ergot alkaloids like dihydroergotamine, ergonovine, ergotamine, methylergonovine  metformin  nicardipine  other medicines that prolong the QT interval (cause an abnormal heart rhythm) like dofetilide, ziprasidone  sirolimus  tacrolimus This list may not describe all possible interactions. Give your health care provider a list of all the medicines, herbs, non-prescription drugs, or dietary supplements you use. Also tell them if you smoke, drink alcohol, or use illegal drugs. Some items may interact with your medicine. What should I watch for while using this medicine? Visit your doctor for regular check ups. Tell your doctor or healthcare professional if your symptoms do not start to get better or if they get worse. This medicine will not relieve an acute attack of angina or chest pain. This medicine can change your heart rhythm. Your health care provider may check your heart rhythm by ordering an electrocardiogram (ECG) while you are taking this medicine. You may get drowsy or dizzy. Do not drive, use machinery, or do anything that needs mental alertness until you know how this medicine affects you. Do not stand or sit up quickly, especially if you are an older patient. This reduces the risk of dizzy or fainting spells. Alcohol may  interfere with the effect of this medicine. Avoid alcoholic drinks. If you are scheduled for any medical or dental procedure, tell your healthcare provider that you are taking this medicine. This medicine can interact with other medicines used during surgery. What side effects may I notice from receiving this medicine? Side effects that you should report to your doctor or health care professional as soon as possible:  allergic reactions like skin rash, itching or hives, swelling of the face, lips, or tongue  breathing problems  changes in vision  fast, irregular or pounding heartbeat  feeling faint or lightheaded, falls  low or high blood pressure  numbness or tingling feelings  ringing in the ears  tremor or shakiness  slow heartbeat (fewer than 50 beats per minute)  swelling of the legs or feet Side effects that usually do not require medical attention (report to your doctor or health care professional if they continue or are bothersome):  constipation  drowsy  dry mouth  headache  nausea or vomiting  stomach upset This list may not describe all possible side effects. Call your doctor for medical advice about side effects. You may report side effects to FDA at 1-800-FDA-1088. Where should I keep my medicine? Keep out of the reach of children. Store at room temperature between 15 and 30 degrees C (59 and 86 degrees F). Throw away any unused medicine after the expiration date. NOTE: This sheet is a summary. It may not cover all possible information. If you have questions about this medicine, talk to your  doctor, pharmacist, or health care provider.  2020 Elsevier/Gold Standard (2018-10-25 09:18:49)   

## 2020-05-08 NOTE — Telephone Encounter (Signed)
FAXED [place]: [place] (faxed on 04/30/20)  Document: cgm prescription form Other records requested: none at this time.  All above requested information has been faxed successfully to [employer] listed above. Documents and fax confirmation have been placed in the faxed file for future reference.   [facility] rep did confirm that this was received and he did state that it was appropriately filled out.

## 2020-05-13 DIAGNOSIS — J449 Chronic obstructive pulmonary disease, unspecified: Secondary | ICD-10-CM | POA: Diagnosis not present

## 2020-05-13 DIAGNOSIS — G5603 Carpal tunnel syndrome, bilateral upper limbs: Secondary | ICD-10-CM | POA: Diagnosis not present

## 2020-05-13 DIAGNOSIS — I679 Cerebrovascular disease, unspecified: Secondary | ICD-10-CM | POA: Diagnosis not present

## 2020-05-13 DIAGNOSIS — K219 Gastro-esophageal reflux disease without esophagitis: Secondary | ICD-10-CM | POA: Diagnosis not present

## 2020-05-13 DIAGNOSIS — D649 Anemia, unspecified: Secondary | ICD-10-CM | POA: Diagnosis not present

## 2020-05-13 DIAGNOSIS — F419 Anxiety disorder, unspecified: Secondary | ICD-10-CM | POA: Diagnosis not present

## 2020-05-13 DIAGNOSIS — I2510 Atherosclerotic heart disease of native coronary artery without angina pectoris: Secondary | ICD-10-CM | POA: Diagnosis not present

## 2020-05-13 DIAGNOSIS — N183 Chronic kidney disease, stage 3 unspecified: Secondary | ICD-10-CM | POA: Diagnosis not present

## 2020-05-13 DIAGNOSIS — M159 Polyosteoarthritis, unspecified: Secondary | ICD-10-CM | POA: Diagnosis not present

## 2020-05-14 DIAGNOSIS — S82871D Displaced pilon fracture of right tibia, subsequent encounter for closed fracture with routine healing: Secondary | ICD-10-CM | POA: Diagnosis not present

## 2020-05-15 DIAGNOSIS — I5042 Chronic combined systolic (congestive) and diastolic (congestive) heart failure: Secondary | ICD-10-CM | POA: Diagnosis not present

## 2020-05-21 ENCOUNTER — Telehealth: Payer: Self-pay

## 2020-05-21 NOTE — Telephone Encounter (Signed)
FAXED [place]: [employer] (Faxed on 05/17/20) Document: Physicians order for CGM Other records requested: none at this time.  All above requested information has been faxed successfully to [employer] listed above. Documents and fax confirmation have been placed in the faxed file for future reference.

## 2020-05-25 DIAGNOSIS — S82851D Displaced trimalleolar fracture of right lower leg, subsequent encounter for closed fracture with routine healing: Secondary | ICD-10-CM | POA: Diagnosis not present

## 2020-05-25 DIAGNOSIS — S82871D Displaced pilon fracture of right tibia, subsequent encounter for closed fracture with routine healing: Secondary | ICD-10-CM | POA: Diagnosis not present

## 2020-05-25 DIAGNOSIS — M6281 Muscle weakness (generalized): Secondary | ICD-10-CM | POA: Diagnosis not present

## 2020-05-27 DIAGNOSIS — N183 Chronic kidney disease, stage 3 unspecified: Secondary | ICD-10-CM | POA: Diagnosis not present

## 2020-05-27 DIAGNOSIS — D649 Anemia, unspecified: Secondary | ICD-10-CM | POA: Diagnosis not present

## 2020-05-27 DIAGNOSIS — G5603 Carpal tunnel syndrome, bilateral upper limbs: Secondary | ICD-10-CM | POA: Diagnosis not present

## 2020-05-27 DIAGNOSIS — J449 Chronic obstructive pulmonary disease, unspecified: Secondary | ICD-10-CM | POA: Diagnosis not present

## 2020-05-27 DIAGNOSIS — K219 Gastro-esophageal reflux disease without esophagitis: Secondary | ICD-10-CM | POA: Diagnosis not present

## 2020-05-27 DIAGNOSIS — M159 Polyosteoarthritis, unspecified: Secondary | ICD-10-CM | POA: Diagnosis not present

## 2020-05-27 DIAGNOSIS — I2510 Atherosclerotic heart disease of native coronary artery without angina pectoris: Secondary | ICD-10-CM | POA: Diagnosis not present

## 2020-05-27 DIAGNOSIS — F419 Anxiety disorder, unspecified: Secondary | ICD-10-CM | POA: Diagnosis not present

## 2020-05-27 DIAGNOSIS — I679 Cerebrovascular disease, unspecified: Secondary | ICD-10-CM | POA: Diagnosis not present

## 2020-06-07 ENCOUNTER — Ambulatory Visit (INDEPENDENT_AMBULATORY_CARE_PROVIDER_SITE_OTHER): Payer: Medicare HMO | Admitting: Endocrinology

## 2020-06-07 ENCOUNTER — Other Ambulatory Visit: Payer: Self-pay

## 2020-06-07 ENCOUNTER — Encounter: Payer: Self-pay | Admitting: Endocrinology

## 2020-06-07 VITALS — BP 100/70 | HR 78 | Ht 62.0 in | Wt 166.0 lb

## 2020-06-07 DIAGNOSIS — E1065 Type 1 diabetes mellitus with hyperglycemia: Secondary | ICD-10-CM | POA: Diagnosis not present

## 2020-06-07 DIAGNOSIS — N1832 Chronic kidney disease, stage 3b: Secondary | ICD-10-CM

## 2020-06-07 NOTE — Progress Notes (Signed)
Patient ID: Deanna Landry, female   DOB: [birth year], 49 y.o.   MRN: [id number]           Reason for Appointment : Follow-up for Type 1 Diabetes  History of Present Illness   Referring HCP:Lee, Keung         Diagnosis: Type 1 diabetes mellitus, date of diagnosis: Age 72         Previous history:   She has been on insulin since her diagnosis when she had significant symptomatic hypoglycemia. Has been on various types of insulin regimens in the past but usually not followed by endocrinologist The last 5 or 6 years she has been on Levemir and NovoLog Previous level of control is variable with A1c only inconsistently below 7  Recent history:   INSULIN regimen:  BASAL insulin: 15 units Tresiba daily  Mealtime insulin: NovoLog 2-6 units before meals  Non-insulin hypoglycemic drugs: Trulicity 1.5 mg weekly  A1c is last 7.1 compared to 6.3 done on 02/02/2020  Current management, blood sugar patterns and problems identified:    She was able to start using the freestyle libre consistently now  However this appears to be reading falsely low, today in the office blood sugar was 199 on her libre compared to 235 with the fingerstick  She has very poor knowledge about insulin management  Although she has been reducing her [name] gradually based on her fasting readings her postprandial readings are frequently very high  She is not taking much NovoLog coverage and reportedly will not take it in the evening at dinnertime for fear of hypoglycemia  Last night she had 2 hotdogs without any mealtime coverage and her blood sugar went up to over 300  HIGHEST blood sugars are overnight although blood sugar may frequently come down to normal or slightly low before breakfast  Also although she has transient low blood sugars in the mornings and rarely bedtime and also rarely before dinnertime she is not having any significant hypoglycemia now  She is also overtreating low blood sugars with  significant rebound  Usually not taking more than 2 units of insulin for meals and not doing any correction insulin  She went back on her Trulicity because she thought her blood sugars and appetite were increased without it for 2 weeks  CONTINUOUS GLUCOSE MONITORING RECORD INTERPRETATION    Dates of Recording: Last 2 weeks  Sensor description:Libre 2  CGM use % of time  90  2-week average/SD  180, was 148  Time in range      50% was 55  % Time Above 180  19  % Time above 250  24  % Time Below 70  7     PRE-MEAL Fasting Lunch Dinner  4-6 AM Overall  Glucose range:       Averages:  179  144  154  245    POST-MEAL PC Breakfast PC Lunch PC Dinner  Glucose range:     Averages:   139  186    Previous averages:  PRE-MEAL Fasting Lunch Dinner Bedtime Overall  Glucose range:       Mean/median:  136  138  153  123    POST-MEAL PC Breakfast PC Lunch PC Dinner  Glucose range:     Mean/median:  112  153  144    Glycemic patterns summary: Blood sugars show significant variability throughout the day especially overnight Day-to-day variability is also significant with overall highest blood sugars between 12-2 AM but not consistently  Lowest blood sugars are between 10 AM-12 PM Hypoglycemia occurring on most of the days at various times    Hypoglycemia:  occurs mostly overnight Factors causing hyperglycemia: Excessive Levemir at night Symptoms of hypoglycemia:none or minimal Treatment of hypoglycemia: Juices, if patient is unconscious mother will call the ambulance         Self-care: The diet that the patient has been following is: None, does not know carbohydrate counting Immediately skipping breakfast, lunch will be assignments, dinner usually consists of bread or pasta Snacks will be peanut butter crackers, yogurt, cottage cheese, fruit Eating out only about once or twice a month   Mealtimes are: Breakfast none Lunch: 1 pm Dinner: 6pm         Exercise: None            Dietician consultation none.          Diabetes labs:  Lab Results  Component Value Date   HGBA1C 7.1 (A) 04/26/2020   HGBA1C 7.7 (H) 10/05/2019   HGBA1C 6.8 (H) 03/10/2019   Lab Results  Component Value Date   CREATININE 1.76 (H) 10/12/2019    No results found for: MICRALBCREAT   Allergies as of 06/07/2020   No Known Allergies     Medication List       Accurate as of June 07, 2020 10:29 AM. If you have any questions, ask your nurse or doctor.        Accu-Chek Aviva Plus test strip Generic drug: glucose blood USE TO CHECK BLOOD SUGAR 5 TIMES A DAY   Accu-Chek Aviva Plus w/Device Kit Use Accu Chek Aviva Plus meter to check blood sugar 5 times daily.   Accu-Chek FastClix Lancets Misc Use Accu Chek Fastclix to check blood sugar 5 times daily.   amLODipine 10 MG tablet Commonly known as: NORVASC Take 10 mg by mouth daily.   aspirin EC 81 MG tablet Take 81 mg by mouth daily.   atorvastatin 80 MG tablet Commonly known as: LIPITOR Take 80 mg by mouth daily.   buPROPion 300 MG 24 hr tablet Commonly known as: WELLBUTRIN XL Take 300 mg by mouth daily.   clopidogrel 75 MG tablet Commonly known as: PLAVIX Take 75 mg by mouth daily.   Gvoke HypoPen 2-Pack 1 MG/0.2ML Soaj Generic drug: Glucagon Inject 1 mg into the skin as needed. For severe lows   isosorbide dinitrate 20 MG tablet Commonly known as: ISORDIL Take 20 mg by mouth 2 (two) times daily.   levothyroxine 50 MCG tablet Commonly known as: SYNTHROID Take 50 mcg by mouth daily.   metoCLOPramide 5 MG tablet Commonly known as: REGLAN Take 1 tablet by mouth daily.   metoprolol tartrate 25 MG tablet Commonly known as: LOPRESSOR Take 12.5 mg by mouth 2 (two) times daily.   NovoLOG FlexPen 100 UNIT/ML FlexPen Generic drug: insulin aspart Inject 4-10 Units into the skin 3 (three) times daily.   pantoprazole 40 MG tablet Commonly known as: PROTONIX Take 40 mg by mouth daily.   pregabalin 75 MG  capsule Commonly known as: LYRICA Take 75 mg by mouth 2 (two) times daily.   ranolazine 500 MG 12 hr tablet Commonly known as: Ranexa Take 1 tablet (500 mg total) by mouth 2 (two) times daily.   sertraline 100 MG tablet Commonly known as: ZOLOFT Take 100 mg by mouth daily.   Systane 0.4-0.3 % Gel ophthalmic gel Generic drug: Polyethyl Glycol-Propyl Glycol Place 1 application into both eyes 3 (three) times daily as needed (  dry eye).   traMADol 50 MG tablet Commonly known as: ULTRAM Take 1 tablet by mouth daily.   [name] FlexTouch 200 UNIT/ML FlexTouch Pen Generic drug: insulin degludec Inject into the skin daily. Patient injecting 15-6 units daily What changed: Another medication with the same name was removed. Continue taking this medication, and follow the directions you see here. Changed by: Elayne Snare, MD   Trulicity 1.5 UX/3.2TF Sopn Generic drug: Dulaglutide Inject 1.5 mg into the skin every Monday.       Allergies: No Known Allergies  Past Medical History:  Diagnosis Date  . CHF (congestive heart failure) ([place])   . Diabetes mellitus without complication ([place])   . Hypertension   . Myocardial infarct, old   . Renal disorder   . Stroke [facility])     Past Surgical History:  Procedure Laterality Date  . ABDOMINAL HYSTERECTOMY    . APPLICATION OF WOUND VAC Right 10/11/2019   Procedure: WOUND VAC CHANGE TO RIGHT LEG;  Surgeon: Shona Needles, MD;  Location: [place];  Service: Orthopedics;  Laterality: Right;  . EYE SURGERY    . HARDWARE REMOVAL Right 10/06/2019   Procedure: HARDWARE REMOVAL;  Surgeon: Shona Needles, MD;  Location: [place];  Service: Orthopedics;  Laterality: Right;  . I & D EXTREMITY Right 10/06/2019   Procedure: IRRIGATION AND DEBRIDEMENT EXTREMITY;  Surgeon: Shona Needles, MD;  Location: [place];  Service: Orthopedics;  Laterality: Right;  . I & D EXTREMITY Right 10/09/2019   Procedure: IRRIGATION AND DEBRIDEMENT EXTREMITY and WOUND VAC CHANGE  RIGHT ANKLE;  Surgeon: Shona Needles, MD;  Location: [place];  Service: Orthopedics;  Laterality: Right;  . ORIF ANKLE FRACTURE Right 07/26/2019   Procedure: OPEN REDUCTION INTERNAL FIXATION (ORIF) ANKLE FRACTURE;  Surgeon: Shona Needles, MD;  Location: [place];  Service: Orthopedics;  Laterality: Right;  OPEN REDUCTION INTERNAL FIXATION (ORIF) ANKLE FRACTURE   . TOE AMPUTATION      Family History  Problem Relation Age of Onset  . Diabetes Mellitus II Mother   . Stroke Mother   . Heart attack Mother   . Stomach cancer Father   . Congestive Heart Failure Father   . Diabetes Sister   . Congestive Heart Failure Maternal Grandfather     Social History:  reports that she has never smoked. She has never used smokeless tobacco. She reports previous alcohol use. She reports previous drug use.      Review of Systems  HYPOTHYROIDISM: Treated by PCP with levothyroxine 50 mcg and last TSH 4.1 done in 12/20      Lipids: Last LDL was 80 done in 11/2019, on statin drug using 80 mg Lipitor  No results found for: CHOL, HDL, LDLCALC, LDLDIRECT, TRIG, CHOLHDL  Last dilated eye exam was in 5/20  Peripheral neuropathy: She will get some pains and paresthesia in her legs treated with Lyrica and tramadol.  Also has numbness in her hands  DIABETES COMPLICATIONS: Neuropathy, retinopathy nephropathy   Renal insufficiency: Her last creatinine in June 21 was 2.3  Lab Results  Component Value Date   CREATININE 1.76 (H) 10/12/2019   CREATININE 1.86 (H) 10/11/2019   CREATININE 1.69 (H) 10/10/2019     LABS:  No visits with results within 1 Week(s) from this visit.  Latest known visit with results is:  Office Visit on 04/26/2020  Component Date Value Ref Range Status  . Hemoglobin A1C 04/26/2020 7.1* 4.0 - 5.6 % Final    Physical Examination:  BP 100/70 (BP Location: Left Arm, Patient Position: Sitting, Cuff Size: Normal)   Pulse 78   Ht '5\' 2"'$  (1.575 m)   Wt 166 lb (75.3 kg)   LMP  (LMP  Unknown) Comment: hysterectomy 2014  SpO2 94%   BMI 30.36 kg/m     ASSESSMENT:  Diabetes type 1, on basal bolus insulin  Problems identified:    She is able to use the freestyle libre last month and this is reportedly accurate but not clear why she did not have regular refills  With taking [place] she is requiring much less basal insulin but also her appetite is decreased  Currently taking relatively small doses of NovoLog at meals  Not doing any carbohydrate counting for mealtime coverage  Since she continues to have overnight hypoglycemia likely needs less than 18 units of Tresiba   HYPOTHYROIDISM she will request a follow-up lab on her upcoming visit with PCP who is prescribing her levothyroxine 50 mcg  Renal insufficiency: Recently appears to be worse and she will discussed nephrology referral with PCP  PLAN:   1. Glucose monitoring: . She will try to compare her freestyle libre with fingersticks  2.  Diabetes education: Needs more complete diabetes education and referral has been sent  3. Insulin changes prescribed: . [name] will be reduced to 14 units . Continue Trulicity . She will need to take 2 units for 2 slices of bread and extra 1-2 units insulin if eating more carbohydrate, will need to adjust this based on her meal size, may take 1 unit if eating only small amount of carbohydrate . She will need to learn how to count carbohydrates . If her blood sugar is high before eating she will take 1 unit for every 100 points over 100 . Even if her blood sugars are near normal she will take mealtime coverage . If blood sugars are relatively low she will take her insulin when the blood sugar starts rising over 120 . Consider insulin pump once she is more comfortable with doing mealtime insulin . Avoid high-fat meals   5. Follow-up: 6 weeks  Patient Instructions  Must take 2 units for a sandwich type meal  add extra for high sugars, 1 unit per 100 mg  Do  Fingersticks     Elayne Snare 06/07/2020, 10:29 AM      Note: This note was prepared with Dragon voice recognition system technology. Any transcriptional errors that result from this process are unintentional.

## 2020-06-07 NOTE — Patient Instructions (Addendum)
Must take 2 units for a sandwich type meal  add extra for high sugars, 1 unit per 100 mg  Do Fingersticks

## 2020-06-10 DIAGNOSIS — M159 Polyosteoarthritis, unspecified: Secondary | ICD-10-CM | POA: Diagnosis not present

## 2020-06-10 DIAGNOSIS — I2510 Atherosclerotic heart disease of native coronary artery without angina pectoris: Secondary | ICD-10-CM | POA: Diagnosis not present

## 2020-06-10 DIAGNOSIS — J449 Chronic obstructive pulmonary disease, unspecified: Secondary | ICD-10-CM | POA: Diagnosis not present

## 2020-06-10 DIAGNOSIS — G4733 Obstructive sleep apnea (adult) (pediatric): Secondary | ICD-10-CM | POA: Diagnosis not present

## 2020-06-10 DIAGNOSIS — G5603 Carpal tunnel syndrome, bilateral upper limbs: Secondary | ICD-10-CM | POA: Diagnosis not present

## 2020-06-10 DIAGNOSIS — N184 Chronic kidney disease, stage 4 (severe): Secondary | ICD-10-CM | POA: Diagnosis not present

## 2020-06-10 DIAGNOSIS — I679 Cerebrovascular disease, unspecified: Secondary | ICD-10-CM | POA: Diagnosis not present

## 2020-06-10 DIAGNOSIS — D649 Anemia, unspecified: Secondary | ICD-10-CM | POA: Diagnosis not present

## 2020-06-10 DIAGNOSIS — F419 Anxiety disorder, unspecified: Secondary | ICD-10-CM | POA: Diagnosis not present

## 2020-06-12 ENCOUNTER — Encounter: Payer: Medicare HMO | Attending: Endocrinology | Admitting: Nutrition

## 2020-06-14 DIAGNOSIS — I5042 Chronic combined systolic (congestive) and diastolic (congestive) heart failure: Secondary | ICD-10-CM | POA: Diagnosis not present

## 2020-06-17 DIAGNOSIS — I679 Cerebrovascular disease, unspecified: Secondary | ICD-10-CM | POA: Diagnosis not present

## 2020-06-17 DIAGNOSIS — G4733 Obstructive sleep apnea (adult) (pediatric): Secondary | ICD-10-CM | POA: Diagnosis not present

## 2020-06-17 DIAGNOSIS — G5603 Carpal tunnel syndrome, bilateral upper limbs: Secondary | ICD-10-CM | POA: Diagnosis not present

## 2020-06-17 DIAGNOSIS — I2510 Atherosclerotic heart disease of native coronary artery without angina pectoris: Secondary | ICD-10-CM | POA: Diagnosis not present

## 2020-06-17 DIAGNOSIS — D649 Anemia, unspecified: Secondary | ICD-10-CM | POA: Diagnosis not present

## 2020-06-17 DIAGNOSIS — N184 Chronic kidney disease, stage 4 (severe): Secondary | ICD-10-CM | POA: Diagnosis not present

## 2020-06-17 DIAGNOSIS — M159 Polyosteoarthritis, unspecified: Secondary | ICD-10-CM | POA: Diagnosis not present

## 2020-06-17 DIAGNOSIS — E039 Hypothyroidism, unspecified: Secondary | ICD-10-CM | POA: Diagnosis not present

## 2020-06-17 DIAGNOSIS — J449 Chronic obstructive pulmonary disease, unspecified: Secondary | ICD-10-CM | POA: Diagnosis not present

## 2020-06-17 DIAGNOSIS — F419 Anxiety disorder, unspecified: Secondary | ICD-10-CM | POA: Diagnosis not present

## 2020-06-24 DIAGNOSIS — G5603 Carpal tunnel syndrome, bilateral upper limbs: Secondary | ICD-10-CM | POA: Diagnosis not present

## 2020-06-24 DIAGNOSIS — R2241 Localized swelling, mass and lump, right lower limb: Secondary | ICD-10-CM | POA: Diagnosis not present

## 2020-06-24 DIAGNOSIS — I10 Essential (primary) hypertension: Secondary | ICD-10-CM | POA: Diagnosis not present

## 2020-06-24 DIAGNOSIS — I679 Cerebrovascular disease, unspecified: Secondary | ICD-10-CM | POA: Diagnosis not present

## 2020-06-24 DIAGNOSIS — N184 Chronic kidney disease, stage 4 (severe): Secondary | ICD-10-CM | POA: Diagnosis not present

## 2020-06-24 DIAGNOSIS — M159 Polyosteoarthritis, unspecified: Secondary | ICD-10-CM | POA: Diagnosis not present

## 2020-06-24 DIAGNOSIS — R110 Nausea: Secondary | ICD-10-CM | POA: Diagnosis not present

## 2020-06-24 DIAGNOSIS — J449 Chronic obstructive pulmonary disease, unspecified: Secondary | ICD-10-CM | POA: Diagnosis not present

## 2020-06-24 DIAGNOSIS — M79671 Pain in right foot: Secondary | ICD-10-CM | POA: Diagnosis not present

## 2020-06-24 DIAGNOSIS — S9001XA Contusion of right ankle, initial encounter: Secondary | ICD-10-CM | POA: Diagnosis not present

## 2020-06-24 DIAGNOSIS — G4733 Obstructive sleep apnea (adult) (pediatric): Secondary | ICD-10-CM | POA: Diagnosis not present

## 2020-06-24 DIAGNOSIS — S9031XA Contusion of right foot, initial encounter: Secondary | ICD-10-CM | POA: Diagnosis not present

## 2020-07-01 DIAGNOSIS — G5603 Carpal tunnel syndrome, bilateral upper limbs: Secondary | ICD-10-CM | POA: Diagnosis not present

## 2020-07-01 DIAGNOSIS — M159 Polyosteoarthritis, unspecified: Secondary | ICD-10-CM | POA: Diagnosis not present

## 2020-07-01 DIAGNOSIS — M79671 Pain in right foot: Secondary | ICD-10-CM | POA: Diagnosis not present

## 2020-07-01 DIAGNOSIS — I10 Essential (primary) hypertension: Secondary | ICD-10-CM | POA: Diagnosis not present

## 2020-07-01 DIAGNOSIS — R110 Nausea: Secondary | ICD-10-CM | POA: Diagnosis not present

## 2020-07-01 DIAGNOSIS — J449 Chronic obstructive pulmonary disease, unspecified: Secondary | ICD-10-CM | POA: Diagnosis not present

## 2020-07-01 DIAGNOSIS — G4733 Obstructive sleep apnea (adult) (pediatric): Secondary | ICD-10-CM | POA: Diagnosis not present

## 2020-07-01 DIAGNOSIS — N184 Chronic kidney disease, stage 4 (severe): Secondary | ICD-10-CM | POA: Diagnosis not present

## 2020-07-01 DIAGNOSIS — I679 Cerebrovascular disease, unspecified: Secondary | ICD-10-CM | POA: Diagnosis not present

## 2020-07-02 DIAGNOSIS — M19071 Primary osteoarthritis, right ankle and foot: Secondary | ICD-10-CM | POA: Diagnosis not present

## 2020-07-02 DIAGNOSIS — S82871D Displaced pilon fracture of right tibia, subsequent encounter for closed fracture with routine healing: Secondary | ICD-10-CM | POA: Diagnosis not present

## 2020-07-08 ENCOUNTER — Other Ambulatory Visit: Payer: Self-pay

## 2020-07-08 ENCOUNTER — Ambulatory Visit (INDEPENDENT_AMBULATORY_CARE_PROVIDER_SITE_OTHER): Payer: Medicare HMO | Admitting: Cardiology

## 2020-07-08 ENCOUNTER — Telehealth: Payer: Self-pay | Admitting: Cardiology

## 2020-07-08 ENCOUNTER — Encounter: Payer: Self-pay | Admitting: Cardiology

## 2020-07-08 VITALS — BP 132/72 | HR 72 | Ht 62.0 in | Wt 174.0 lb

## 2020-07-08 DIAGNOSIS — E1169 Type 2 diabetes mellitus with other specified complication: Secondary | ICD-10-CM

## 2020-07-08 DIAGNOSIS — R110 Nausea: Secondary | ICD-10-CM | POA: Diagnosis not present

## 2020-07-08 DIAGNOSIS — N184 Chronic kidney disease, stage 4 (severe): Secondary | ICD-10-CM | POA: Diagnosis not present

## 2020-07-08 DIAGNOSIS — I679 Cerebrovascular disease, unspecified: Secondary | ICD-10-CM | POA: Diagnosis not present

## 2020-07-08 DIAGNOSIS — M79671 Pain in right foot: Secondary | ICD-10-CM | POA: Diagnosis not present

## 2020-07-08 DIAGNOSIS — I10 Essential (primary) hypertension: Secondary | ICD-10-CM | POA: Diagnosis not present

## 2020-07-08 DIAGNOSIS — M159 Polyosteoarthritis, unspecified: Secondary | ICD-10-CM | POA: Diagnosis not present

## 2020-07-08 DIAGNOSIS — E785 Hyperlipidemia, unspecified: Secondary | ICD-10-CM | POA: Diagnosis not present

## 2020-07-08 DIAGNOSIS — I2510 Atherosclerotic heart disease of native coronary artery without angina pectoris: Secondary | ICD-10-CM | POA: Diagnosis not present

## 2020-07-08 DIAGNOSIS — E669 Obesity, unspecified: Secondary | ICD-10-CM | POA: Diagnosis not present

## 2020-07-08 DIAGNOSIS — J449 Chronic obstructive pulmonary disease, unspecified: Secondary | ICD-10-CM | POA: Diagnosis not present

## 2020-07-08 DIAGNOSIS — G5603 Carpal tunnel syndrome, bilateral upper limbs: Secondary | ICD-10-CM | POA: Diagnosis not present

## 2020-07-08 DIAGNOSIS — G4733 Obstructive sleep apnea (adult) (pediatric): Secondary | ICD-10-CM | POA: Diagnosis not present

## 2020-07-08 NOTE — Telephone Encounter (Signed)
Patient wants to let Dr. Agustin Cree know that she is going to have her ankle surgery on 07/19/20 regardless of what Dr. Agustin Cree thinks. She says she has been waiting too long to have it, and has been in a lot of pain, and now that it is scheduled, she is going to follow through with it. She strongly urged that she does not want Dr. Agustin Cree to contact the hospital to postpone the surgery.

## 2020-07-08 NOTE — Patient Instructions (Addendum)
Medication Instructions:  Your physician recommends that you continue on your current medications as directed. Please refer to the Current Medication list given to you today.  *If you need a refill on your cardiac medications before your next appointment, please call your pharmacy*   Lab Work: None ordered  If you have labs (blood work) drawn today and your tests are completely normal, you will receive your results only by: Marland Kitchen MyChart Message (if you have MyChart) OR . A paper copy in the mail If you have any lab test that is abnormal or we need to change your treatment, we will call you to review the results.   Testing/Procedures: None ordered   Follow-Up: At [facility], you and your health needs are our priority.  As part of our continuing mission to provide you with exceptional heart care, we have created designated Provider Care Teams.  These Care Teams include your primary Cardiologist (physician) and Advanced Practice Providers (APPs -  Physician Assistants and Nurse Practitioners) who all work together to provide you with the care you need, when you need it.  We recommend signing up for the patient portal called "MyChart".  Sign up information is provided on this After Visit Summary.  MyChart is used to connect with patients for Virtual Visits (Telemedicine).  Patients are able to view lab/test results, encounter notes, upcoming appointments, etc.  Non-urgent messages can be sent to your provider as well.   To learn more about what you can do with MyChart, go to [email or web address].    Your next appointment:   3 month(s)  The format for your next appointment:   In Person  Provider:   Jenne Campus, MD   Other Instructions

## 2020-07-08 NOTE — Progress Notes (Signed)
Cardiology Office Note:    Date:  07/08/2020   ID:  Deanna Landry, DOB 04/13/1971, MRN [id number]  PCP:  Cher Nakai, MD  Cardiologist:  Jenne Campus, MD    Referring MD: Cher Nakai, MD   Chief Complaint  Patient presents with  . Follow-up  I need to have a foot surgery  History of Present Illness:    Deanna Landry is a 49 y.o. female with complex past medical history which include poorly controlled longstanding diabetes, now much better control, also essential hypertension, dyslipidemia, coronary artery disease.  She did have cardiac catheterization done in November 2020 with multiple stent placed into the right coronary artery.  That was done in [place].  She comes today to my office because she is scheduled to have ankle surgery.  Overall since I put her on ranolazine she says she is feeling much better.  She denies have any chest pain tightness squeezing pressure burning chest, obviously exercising is difficult for her because of problem with her ankle.  Her diabetes is much better control and I congratulated her for it.  She is on high intense statin 80 mg of Lipitor which is great and I will continue.  Past Medical History:  Diagnosis Date  . CHF (congestive heart failure) ([place])   . Diabetes mellitus without complication ([place])   . Hypertension   . Myocardial infarct, old   . Renal disorder   . Stroke [facility])     Past Surgical History:  Procedure Laterality Date  . ABDOMINAL HYSTERECTOMY    . APPLICATION OF WOUND VAC Right 10/11/2019   Procedure: WOUND VAC CHANGE TO RIGHT LEG;  Surgeon: Shona Needles, MD;  Location: [place];  Service: Orthopedics;  Laterality: Right;  . EYE SURGERY    . HARDWARE REMOVAL Right 10/06/2019   Procedure: HARDWARE REMOVAL;  Surgeon: Shona Needles, MD;  Location: [place];  Service: Orthopedics;  Laterality: Right;  . I & D EXTREMITY Right 10/06/2019   Procedure: IRRIGATION AND DEBRIDEMENT EXTREMITY;  Surgeon: Shona Needles, MD;  Location: [place];   Service: Orthopedics;  Laterality: Right;  . I & D EXTREMITY Right 10/09/2019   Procedure: IRRIGATION AND DEBRIDEMENT EXTREMITY and WOUND VAC CHANGE RIGHT ANKLE;  Surgeon: Shona Needles, MD;  Location: [place];  Service: Orthopedics;  Laterality: Right;  . ORIF ANKLE FRACTURE Right 07/26/2019   Procedure: OPEN REDUCTION INTERNAL FIXATION (ORIF) ANKLE FRACTURE;  Surgeon: Shona Needles, MD;  Location: [place];  Service: Orthopedics;  Laterality: Right;  OPEN REDUCTION INTERNAL FIXATION (ORIF) ANKLE FRACTURE   . TOE AMPUTATION      Current Medications: Current Meds  Medication Sig  . amLODipine (NORVASC) 10 MG tablet Take 10 mg by mouth daily.  Marland Kitchen aspirin EC 81 MG tablet Take 81 mg by mouth daily.  Marland Kitchen atorvastatin (LIPITOR) 80 MG tablet Take 80 mg by mouth daily.  Marland Kitchen buPROPion (WELLBUTRIN XL) 300 MG 24 hr tablet Take 300 mg by mouth daily.  . clopidogrel (PLAVIX) 75 MG tablet Take 75 mg by mouth daily.  . Glucagon (GVOKE HYPOPEN 2-PACK) 1 MG/0.2ML SOAJ Inject 1 mg into the skin as needed. For severe lows  . HYDROcodone-acetaminophen (NORCO/VICODIN) 5-325 MG tablet as needed.  . insulin degludec (TRESIBA FLEXTOUCH) 200 UNIT/ML FlexTouch Pen Inject into the skin daily. Patient injecting 15-6 units daily  . isosorbide dinitrate (ISORDIL) 20 MG tablet Take 20 mg by mouth 2 (two) times daily.  Marland Kitchen levothyroxine (SYNTHROID) 50 MCG tablet  Take 50 mcg by mouth daily.  . metoCLOPramide (REGLAN) 5 MG tablet Take 1 tablet by mouth daily.  . metoprolol tartrate (LOPRESSOR) 25 MG tablet Take 12.5 mg by mouth 2 (two) times daily.  Marland Kitchen NOVOLOG FLEXPEN 100 UNIT/ML FlexPen Inject 4-10 Units into the skin 3 (three) times daily.   . pantoprazole (PROTONIX) 40 MG tablet Take 40 mg by mouth daily.  Vladimir Faster Glycol-Propyl Glycol (SYSTANE) 0.4-0.3 % GEL ophthalmic gel Place 1 application into both eyes 3 (three) times daily as needed (dry eye).  . pregabalin (LYRICA) 75 MG capsule Take 75 mg by mouth 2 (two) times daily.   . ranolazine (RANEXA) 500 MG 12 hr tablet Take 1 tablet (500 mg total) by mouth 2 (two) times daily.  . sertraline (ZOLOFT) 100 MG tablet Take 100 mg by mouth daily.  . traMADol (ULTRAM) 50 MG tablet Take 1 tablet by mouth daily.  . TRULICITY 1.5 HW/2.9HB SOPN Inject 1.5 mg into the skin every Monday.      Allergies:   Patient has no known allergies.   Social History   Socioeconomic History  . Marital status: Divorced    Spouse name: Not on file  . Number of children: Not on file  . Years of education: Not on file  . Highest education level: Not on file  Occupational History  . Not on file  Tobacco Use  . Smoking status: Never Smoker  . Smokeless tobacco: Never Used  Vaping Use  . Vaping Use: Never used  Substance and Sexual Activity  . Alcohol use: Not Currently  . Drug use: Not Currently  . Sexual activity: Not on file  Other Topics Concern  . Not on file  Social History Narrative  . Not on file   Social Determinants of Health   Financial Resource Strain:   . Difficulty of Paying Living Expenses: Not on file  Food Insecurity:   . Worried About Charity fundraiser in the Last Year: Not on file  . Ran Out of Food in the Last Year: Not on file  Transportation Needs: No Transportation Needs  . Lack of Transportation (Medical): No  . Lack of Transportation (Non-Medical): No  Physical Activity:   . Days of Exercise per Week: Not on file  . Minutes of Exercise per Session: Not on file  Stress:   . Feeling of Stress : Not on file  Social Connections:   . Frequency of Communication with Friends and Family: Not on file  . Frequency of Social Gatherings with Friends and Family: Not on file  . Attends Religious Services: Not on file  . Active Member of Clubs or Organizations: Not on file  . Attends Archivist Meetings: Not on file  . Marital Status: Not on file     Family History: The patient's family history includes Congestive Heart Failure in her father  and maternal grandfather; Diabetes in her sister; Diabetes Mellitus II in her mother; Heart attack in her mother; Stomach cancer in her father; Stroke in her mother. ROS:   Please see the history of present illness.    All 14 point review of systems negative except as described per history of present illness  EKGs/Labs/Other Studies Reviewed:      Recent Labs: 10/03/2019: ALT 11 10/12/2019: BUN 20; Creatinine, Ser 1.76; Hemoglobin 7.3; Platelets 258; Potassium 5.2; Sodium 139  Recent Lipid Panel No results found for: CHOL, TRIG, HDL, CHOLHDL, VLDL, LDLCALC, LDLDIRECT  Physical Exam:    VS:  BP 132/72 (BP Location: Left Arm, Patient Position: Sitting, Cuff Size: Normal)   Pulse 72   Ht 5\' 2"  (1.575 m)   Wt 174 lb (78.9 kg)   LMP  (LMP Unknown) Comment: hysterectomy 2014  SpO2 97%   BMI 31.83 kg/m     Wt Readings from Last 3 Encounters:  07/08/20 174 lb (78.9 kg)  06/07/20 166 lb (75.3 kg)  05/06/20 169 lb 6.4 oz (76.8 kg)     GEN:  Well nourished, well developed in no acute distress HEENT: Normal NECK: No JVD; No carotid bruits LYMPHATICS: No lymphadenopathy CARDIAC: RRR, no murmurs, no rubs, no gallops RESPIRATORY:  Clear to auscultation without rales, wheezing or rhonchi  ABDOMEN: Soft, non-tender, non-distended MUSCULOSKELETAL:  No edema; No deformity  SKIN: Warm and dry LOWER EXTREMITIES: no swelling NEUROLOGIC:  Alert and oriented x 3 PSYCHIATRIC:  Normal affect   ASSESSMENT:    1. Coronary artery disease involving native coronary artery of native heart without angina pectoris   2. Diabetes mellitus type 2 in obese ([place])   3. Dyslipidemia    PLAN:    In order of problems listed above:  1. Coronary artery disease status post multiple stents to right coronary artery done in November 2020.  She is scheduled to have surgery done and a stent surgery will be done either under general or spinal anesthesia on top of that there is some conversation about interrupting  her dual antiplatelets therapy.  Because of her complex anatomy and multiple stent to the right coronary arteries I prefer not to interrupt dual antiplatelets therapy unless it is absolutely necessary.  I will send a message to her surgeon asking if surgery can be postponed if not there will be some risk of this procedure on top of that she does not have good exercise tolerance and is she is a high risk from coronary artery point review, therefore, if we decided to proceed with surgery I would like her to have a stress test done.  In the meantime we will continue with present management. 2. Diabetes mellitus much better control.  She does have continuous glucose monitor.  Her hemoglobin A1c from June 21 was 7.6.  Which is much better than before.  She is followed by endocrinology. 3. Dyslipidemia I do have her fasting lipid profile from 04/29/2020 and looking at K PN LDL 64 HDL 49 she is on high intense statin which I will continue. 4. Essential hypertension her blood pressures well controlled continue present management. 5. Preop evaluation for ankle surgery plan as outlined above.  I will talk to the surgeon and see if there is a way to postpone the surgery because of cardiac risks especially when we talk about interrupting of dual antiplatelet therapy.   Medication Adjustments/Labs and Tests Ordered: Current medicines are reviewed at length with the patient today.  Concerns regarding medicines are outlined above.  No orders of the defined types were placed in this encounter.  Medication changes: No orders of the defined types were placed in this encounter.   Signed, Park Liter, MD, [facility] 07/08/2020 11:57 AM    [place]

## 2020-07-09 DIAGNOSIS — R809 Proteinuria, unspecified: Secondary | ICD-10-CM | POA: Diagnosis not present

## 2020-07-09 DIAGNOSIS — N189 Chronic kidney disease, unspecified: Secondary | ICD-10-CM | POA: Diagnosis not present

## 2020-07-09 DIAGNOSIS — N184 Chronic kidney disease, stage 4 (severe): Secondary | ICD-10-CM | POA: Diagnosis not present

## 2020-07-09 DIAGNOSIS — N281 Cyst of kidney, acquired: Secondary | ICD-10-CM | POA: Diagnosis not present

## 2020-07-09 DIAGNOSIS — I129 Hypertensive chronic kidney disease with stage 1 through stage 4 chronic kidney disease, or unspecified chronic kidney disease: Secondary | ICD-10-CM | POA: Diagnosis not present

## 2020-07-09 DIAGNOSIS — D631 Anemia in chronic kidney disease: Secondary | ICD-10-CM | POA: Diagnosis not present

## 2020-07-09 DIAGNOSIS — N2581 Secondary hyperparathyroidism of renal origin: Secondary | ICD-10-CM | POA: Diagnosis not present

## 2020-07-09 NOTE — Telephone Encounter (Signed)
Okay, if that is the case she need to have a Lexiscan done

## 2020-07-09 NOTE — Telephone Encounter (Signed)
Tried calling patient. No answer and no voicemail set up for me to leave a message. 

## 2020-07-10 NOTE — Telephone Encounter (Signed)
Left message for patient to return call.

## 2020-07-11 NOTE — Progress Notes (Signed)
[facility] Dr. Doreatha Martin for procedure orders.

## 2020-07-11 NOTE — Pre-Procedure Instructions (Signed)
Your procedure is scheduled on Friday, September 3, from 08:00 AM- 11:30 AM.  Report to [facility] Main Entrance "A" at 06:00 A.M., and check in at the Admitting office.  Call this number if you have problems the morning of surgery:  [phone]  Call [phone] if you have any questions prior to your surgery date Monday-Friday 8am-4pm.    Remember:  Do not eat after midnight the night before your surgery.  You may drink clear liquids until 05:00 AM the morning of your surgery.   Clear liquids allowed are: Water, Non-Citrus Juices (without pulp), Carbonated Beverages, Clear Tea, Black Coffee Only, and Gatorade.    Take these medicines the morning of surgery with A SIP OF WATER: amLODipine (NORVASC) atorvastatin (LIPITOR) buPROPion (WELLBUTRIN XL) isosorbide dinitrate (ISORDIL) levothyroxine (SYNTHROID) metoCLOPramide (REGLAN) metoprolol tartrate (LOPRESSOR) pantoprazole (PROTONIX) pregabalin (LYRICA) ranolazine (RANEXA) sertraline (ZOLOFT) traMADol (ULTRAM)  IF NEEDED: HYDROcodone-acetaminophen (NORCO/VICODIN) Polyethyl Glycol-Propyl Glycol (SYSTANE) eye drops   *Follow your surgeon's instructions on when to stop Aspirin and clopidogrel (PLAVIX).  If no instructions were given by your surgeon then you will need to call the office to get those instructions.     As of today, STOP taking any Aleve, Naproxen, Ibuprofen, Motrin, Advil, Goody's, BC's, all herbal medications, fish oil, and all vitamins.   WHAT DO I DO ABOUT MY DIABETES MEDICATION?  . THE NIGHT BEFORE SURGERY: >>Take 50% of insulin degludec (TRESIBA FLEXTOUCH).  >>Do not take bedtime dose of NOVOLOG insulin.        THE MORNING OF SURGERY: >>Take 50% of insulin degludec (TRESIBA FLEXTOUCH). >>Do not take NOVOLOG insulin. However, IF your CBG is greater than 220 mg/dL, you may take  of your sliding scale (correction) dose (5 units) of NOVOLOG insulin.   . The day of surgery, do not take other diabetes  injectables, including TRULICITY.  Do not take oral diabetes medicines (pills) the morning of surgery.   HOW TO MANAGE YOUR DIABETES BEFORE AND AFTER SURGERY  Why is it important to control my blood sugar before and after surgery? . Improving blood sugar levels before and after surgery helps healing and can limit problems. . A way of improving blood sugar control is eating a healthy diet by: o  Eating less sugar and carbohydrates o  Increasing activity/exercise o  Talking with your doctor about reaching your blood sugar goals . High blood sugars (greater than 180 mg/dL) can raise your risk of infections and slow your recovery, so you will need to focus on controlling your diabetes during the weeks before surgery. . Make sure that the doctor who takes care of your diabetes knows about your planned surgery including the date and location.  How do I manage my blood sugar before surgery? . Check your blood sugar at least 4 times a day, starting 2 days before surgery, to make sure that the level is not too high or low. . Check your blood sugar the morning of your surgery when you wake up and every 2 hours until you get to the Short Stay unit. o If your blood sugar is less than 70 mg/dL, you will need to treat for low blood sugar: - Do not take insulin. - Treat a low blood sugar (less than 70 mg/dL) with  cup of clear juice (cranberry or apple), 4 glucose tablets, OR glucose gel. - Recheck blood sugar in 15 minutes after treatment (to make sure it is greater than 70 mg/dL). If your blood sugar is not greater  than 70 mg/dL on recheck, call [phone] for further instructions. . Report your blood sugar to the short stay nurse when you get to Short Stay.  . If you are admitted to the hospital after surgery: o Your blood sugar will be checked by the staff and you will probably be given insulin after surgery (instead of oral diabetes medicines) to make sure you have good blood sugar levels. o The  goal for blood sugar control after surgery is 80-180 mg/dL.         The Morning of Surgery:              Do not wear jewelry, make up, or nail polish.            Do not wear lotions, powders, perfumes, or deodorant.            Do not shave 48 hours prior to surgery.              Do not bring valuables to the hospital.            [facility] is not responsible for any belongings or valuables.  Do NOT Smoke (Tobacco/Vaping) or drink Alcohol 24 hours prior to your procedure.  If you use a CPAP at night, you may bring all equipment for your overnight stay.   Contacts, glasses, dentures or bridgework may not be worn into surgery.      For patients admitted to the hospital, discharge time will be determined by your treatment team.   Patients discharged the day of surgery will not be allowed to drive home, and someone needs to stay with them for 24 hours.    Special instructions:   [place]- Preparing For Surgery  Before surgery, you can play an important role. Because skin is not sterile, your skin needs to be as free of germs as possible. You can reduce the number of germs on your skin by washing with CHG (chlorahexidine gluconate) Soap before surgery.  CHG is an antiseptic cleaner which kills germs and bonds with the skin to continue killing germs even after washing.    Oral Hygiene is also important to reduce your risk of infection.  Remember - BRUSH YOUR TEETH THE MORNING OF SURGERY WITH YOUR REGULAR TOOTHPASTE  Please do not use if you have an allergy to CHG or antibacterial soaps. If your skin becomes reddened/irritated stop using the CHG.  Do not shave (including legs and underarms) for at least 48 hours prior to first CHG shower. It is OK to shave your face.  Please follow these instructions carefully.   1. Shower the NIGHT BEFORE SURGERY and the MORNING OF SURGERY with CHG Soap.   2. If you chose to wash your hair, wash your hair first as usual with your normal  shampoo.  3. After you shampoo, rinse your hair and body thoroughly to remove the shampoo.  4. Use CHG as you would any other liquid soap. You can apply CHG directly to the skin and wash gently with a scrungie or a clean washcloth.   5. Apply the CHG Soap to your body ONLY FROM THE NECK DOWN.  Do not use on open wounds or open sores. Avoid contact with your eyes, ears, mouth and genitals (private parts). Wash Face and genitals (private parts)  with your normal soap.   6. Wash thoroughly, paying special attention to the area where your surgery will be performed.  7. Thoroughly rinse your body with warm water from  the neck down.  8. DO NOT shower/wash with your normal soap after using and rinsing off the CHG Soap.  9. Pat yourself dry with a CLEAN TOWEL.  10. Wear CLEAN PAJAMAS to bed the night before surgery  11. Place CLEAN SHEETS on your bed the night of your first shower and DO NOT SLEEP WITH PETS.   Day of Surgery: Wear Clean/Comfortable clothing the morning of surgery. Do not apply any deodorants/lotions.   Remember to brush your teeth WITH YOUR REGULAR TOOTHPASTE.   Please read over the following fact sheets that you were given.

## 2020-07-12 ENCOUNTER — Encounter (HOSPITAL_COMMUNITY)
Admission: RE | Admit: 2020-07-12 | Discharge: 2020-07-12 | Disposition: A | Discharge status: Home / Self Care | Payer: Medicare HMO | Source: Ambulatory Visit | Attending: Student | Admitting: Student

## 2020-07-12 ENCOUNTER — Other Ambulatory Visit: Payer: Self-pay

## 2020-07-12 ENCOUNTER — Encounter (HOSPITAL_COMMUNITY): Payer: Self-pay

## 2020-07-12 ENCOUNTER — Other Ambulatory Visit: Payer: Self-pay | Admitting: Nephrology

## 2020-07-12 DIAGNOSIS — E119 Type 2 diabetes mellitus without complications: Secondary | ICD-10-CM | POA: Diagnosis not present

## 2020-07-12 DIAGNOSIS — N281 Cyst of kidney, acquired: Secondary | ICD-10-CM

## 2020-07-12 DIAGNOSIS — N184 Chronic kidney disease, stage 4 (severe): Secondary | ICD-10-CM

## 2020-07-12 DIAGNOSIS — Z01812 Encounter for preprocedural laboratory examination: Secondary | ICD-10-CM | POA: Insufficient documentation

## 2020-07-12 HISTORY — DX: Atherosclerotic heart disease of native coronary artery without angina pectoris: I25.10

## 2020-07-12 LAB — BASIC METABOLIC PANEL
Anion gap: 10 (ref 5–15)
BUN: 28 mg/dL — ABNORMAL HIGH (ref 6–20)
CO2: 25 mmol/L (ref 22–32)
Calcium: 9.2 mg/dL (ref 8.9–10.3)
Chloride: 102 mmol/L (ref 98–111)
Creatinine, Ser: 2.14 mg/dL — ABNORMAL HIGH (ref 0.44–1.00)
GFR calc Af Amer: 31 mL/min — ABNORMAL LOW (ref >60)
GFR calc non Af Amer: 27 mL/min — ABNORMAL LOW (ref >60)
Glucose, Bld: 92 mg/dL (ref 70–99)
Potassium: 5.3 mmol/L — ABNORMAL HIGH (ref 3.5–5.1)
Sodium: 137 mmol/L (ref 135–145)

## 2020-07-12 LAB — CBC WITH DIFFERENTIAL/PLATELET
Abs Immature Granulocytes: 0.02 10*3/uL (ref 0.00–0.07)
Basophils Absolute: 0 10*3/uL (ref 0.0–0.1)
Basophils Relative: 0 %
Eosinophils Absolute: 0.1 10*3/uL (ref 0.0–0.5)
Eosinophils Relative: 1 %
HCT: 38.7 % (ref 36.0–46.0)
Hemoglobin: 11.9 g/dL — ABNORMAL LOW (ref 12.0–15.0)
Immature Granulocytes: 0 %
Lymphocytes Relative: 21 %
Lymphs Abs: 1 10*3/uL (ref 0.7–4.0)
MCH: 26.9 pg (ref 26.0–34.0)
MCHC: 30.7 g/dL (ref 30.0–36.0)
MCV: 87.4 fL (ref 80.0–100.0)
Monocytes Absolute: 0.3 10*3/uL (ref 0.1–1.0)
Monocytes Relative: 6 %
Neutro Abs: 3.3 10*3/uL (ref 1.7–7.7)
Neutrophils Relative %: 72 %
Platelets: 209 10*3/uL (ref 150–400)
RBC: 4.43 MIL/uL (ref 3.87–5.11)
RDW: 13.8 % (ref 11.5–15.5)
WBC: 4.6 10*3/uL (ref 4.0–10.5)
nRBC: 0 % (ref 0.0–0.2)

## 2020-07-12 LAB — SURGICAL PCR SCREEN
MRSA, PCR: NEGATIVE
Staphylococcus aureus: NEGATIVE

## 2020-07-12 LAB — PROTIME-INR
INR: 1 (ref 0.8–1.2)
Prothrombin Time: 12.8 seconds (ref 11.4–15.2)

## 2020-07-12 LAB — GLUCOSE, CAPILLARY: Glucose-Capillary: 95 mg/dL (ref 70–99)

## 2020-07-12 NOTE — Progress Notes (Signed)
PCP:  Cher Nakai, MD Cardiologist:  Jenne Campus, MD  EKG:  10/10/19 CXR: 07/26/19 ECHO:  02/21/20 Stress Test:  Denies Cardiac Cath:  12/20  Fasting Blood Sugar- 42-168 Checks Blood Sugar_4__ times a day  Patient denies shortness of breath, fever, cough, and chest pain at PAT appointment.  Patient verbalized understanding of instructions provided today at the PAT appointment.  Patient asked to review instructions at home and day of surgery.

## 2020-07-15 ENCOUNTER — Encounter (HOSPITAL_COMMUNITY): Payer: Self-pay | Admitting: Certified Registered Nurse Anesthetist

## 2020-07-15 ENCOUNTER — Encounter (HOSPITAL_COMMUNITY): Payer: Self-pay

## 2020-07-15 ENCOUNTER — Encounter (HOSPITAL_COMMUNITY): Payer: Self-pay | Admitting: Vascular Surgery

## 2020-07-15 DIAGNOSIS — I5042 Chronic combined systolic (congestive) and diastolic (congestive) heart failure: Secondary | ICD-10-CM | POA: Diagnosis not present

## 2020-07-15 DIAGNOSIS — I679 Cerebrovascular disease, unspecified: Secondary | ICD-10-CM | POA: Diagnosis not present

## 2020-07-15 DIAGNOSIS — M79671 Pain in right foot: Secondary | ICD-10-CM | POA: Diagnosis not present

## 2020-07-15 DIAGNOSIS — J449 Chronic obstructive pulmonary disease, unspecified: Secondary | ICD-10-CM | POA: Diagnosis not present

## 2020-07-15 DIAGNOSIS — G4733 Obstructive sleep apnea (adult) (pediatric): Secondary | ICD-10-CM | POA: Diagnosis not present

## 2020-07-15 DIAGNOSIS — M159 Polyosteoarthritis, unspecified: Secondary | ICD-10-CM | POA: Diagnosis not present

## 2020-07-15 DIAGNOSIS — G5603 Carpal tunnel syndrome, bilateral upper limbs: Secondary | ICD-10-CM | POA: Diagnosis not present

## 2020-07-15 DIAGNOSIS — R110 Nausea: Secondary | ICD-10-CM | POA: Diagnosis not present

## 2020-07-15 DIAGNOSIS — I10 Essential (primary) hypertension: Secondary | ICD-10-CM | POA: Diagnosis not present

## 2020-07-15 DIAGNOSIS — N184 Chronic kidney disease, stage 4 (severe): Secondary | ICD-10-CM | POA: Diagnosis not present

## 2020-07-15 NOTE — Progress Notes (Addendum)
Anesthesia Chart Review:  Case: [id number] Date/Time: 07/19/20 0745   Procedures:      ANKLE FUSION (Right Ankle)     HARDWARE REMOVAL ANKLE (Right )   Anesthesia type: General   Pre-op diagnosis: RIGHT ANKLE ARTHRITIS   Location: [place] [facility] OR ROOM 03 / [place] [facility]   Surgeons: Shona Needles, MD      DISCUSSION: [name] scheduled for the above procedure.  History includes never smoker, DM1 (diagnosed age 30), diabetic foot ulcers (s/p right 2nd toe amputation), CHF (LVEF 78-67%, grade 1 diastolic dysfunction 04/23/19 echo), CAD (NSTEMI in setting of DKA/septic shock; s/p DES RCA and RPDA 10/23/19 [facility]), CVA, CKD (stage III), COPD (history of 2L O2), hysterectomy (2014). BMI is consistent with obesity. Legally blind by notes.   Several admissions over the past year:  - [facility]) ED evaluation 10/26/19 for epistaxis, s/p Afrin treatment.  - [facility] Admission 10/15/19-10/25/19 for DKA, septic shock, acute encephalopathy, PNA,  acute respiratory failure, AKI (Cr 2.73, baseline CKD3), NSTEMI (s/p DES to RCA and RPDA 10/23/19). Apparently, she initially presented to [facility] and was intubated there, but transferred to [facility] due to her critical nature and was admitted there to the ICU and treated with pressors for hypotension/shock. COVID-19 negative. Endocrinology consulted for DKA/DM1 management. Extubated by 10/18/19. She had a markedly elevated troponin (> 14K) which prompted cardiology evaluation and ultimately PCI. ASA and Plavix recommended for 1 year. Out-patient follow-up with cardiology, primary care, orthopedics arranged.  - [place] Admission 10/03/19-10/12/19 for early DKA with right ankle post-operative cellulitis/infection, s/p I&D, removal of hardware right ankle, placement of wound VAC 10/06/19 ,and I&D, wound VAC replacement 10/09/19 and secondary closure of right ankle wound 10/11/19. Antibiotic coverage for MSSA+ wound cultures. Also treated with Miralax  and enema for post-operative ileus. - [place] Admission 07/25/19-07/29/19 for right ankle fracture from mechanical fall, s/p ORIF of right pilon fracture (fibula and tibia) 07/25/19.     Last evaluation by endocrinologist Dr. Dwyane Dee 06/07/20. She was starting to be able to use the [employer] consistently, but still requiring more teaching about insulin management. A1c on 04/26/20 was 7.1%.   Creatinine 2.14, BUN 28. Known CKD stage III. In review of [facility] records and [facility] records scanned under Media tab, Cr 1.54-2.95 since 09/2019 and ~ 2.14-2.73 during NSTEMI CME admission ~ 10/2019. Overall, her current Creatinine of 2.14 appears to be within her baseline. Will request last primary care notes.   Last cardiology evaluation was on 07/08/20 with Dr. Agustin Cree for follow-up and preoperative evaluation. Given her DES x2 in 10/2019 and "complex anatomy", he preferred that she not interrupt dual antiplatelet therapy unless absolutely necessary. If surgery cannot be postponed and anti-platelet therapy held then he felt "there will be some risk of this procedure on top of that she does not have good exercise tolerance and is she is a high risk for coronary artery point review", and would recommend a stress test. It appears that cardiology has not been able to reach patient to schedule the stress test. I called and spoke with Patrecia Pace, PA-C with Dr. Doreatha Martin. Dr. Doreatha Martin communicated with Dr. Agustin Cree today (07/15/20). Dr. Doreatha Martin is okay with patient continuing ASA, but would need her to hold Plavix starting 07/16/20, so preoperative stress test recommended. Judson Roch will attempt to contact patient since cardiology has not yet been able to reach patient.   ADDENDUM 07/18/20 4:39 PM: Preoperative COVID-19 test negative. I have been in  communication with Dr. Doreatha Martin and with anesthesiologist Oleta Mouse, MD this afternoon. Patient refused the stress test. Her DES x2 were placed nearly 9 months ago. Dr. Doreatha Martin did  have at least some communication with Dr. Agustin Cree earlier this week with reported discussion about holding Plavix briefly for surgery but continuing ASA. Assigned anesthesiologist can discuss further with surgeon and patient.    VS: BP 122/81   Pulse 81   Temp 37.3 C (Oral)   Resp 18   Ht 5\' 2"  (1.575 m)   Wt 78.5 kg   LMP  (LMP Unknown) Comment: hysterectomy 2014  SpO2 99%   BMI 31.64 kg/m    PROVIDERS: Cher Nakai, MD is PCP [facility]). 07/15/20 office note received on 07/18/20. Notes indicates she is awaiting for a home sleep study and for nephrology evaluation for CKD stage IV. Cr was 2.34 there on 05/06/20. Jenne Campus, MD is cardiologist - Elayne Snare, MD is endocrinologist   LABS: Labs reviewed: Acceptable for surgery.  A1c 7.1% on 04/26/20. Known CKD. SEE DISCUSSION.  (all labs ordered are listed, but only abnormal results are displayed)  Labs Reviewed  BASIC METABOLIC PANEL - Abnormal; Notable for the following components:      Result Value   Potassium 5.3 (*)    BUN 28 (*)    Creatinine, Ser 2.14 (*)    GFR calc non Af Amer 27 (*)    GFR calc Af Amer 31 (*)    All other components within normal limits  CBC WITH DIFFERENTIAL/PLATELET - Abnormal; Notable for the following components:   Hemoglobin 11.9 (*)    All other components within normal limits  SURGICAL PCR SCREEN  PROTIME-INR  GLUCOSE, CAPILLARY     IMAGES: Xray Right foot 06/24/20 [facility], [place]/PACS): FINDINGS: Amputation of the second digit at the level of the distal portion of the proximal phalanx. Mild hallux valgus deformity with secondary osteoarthritis of the first metatarsophalangeal joint. Surgical changes within the ankle, suboptimally evaluated. No acute fracture or dislocation. Remote distal tibia and fibular fractures. IMPRESSION: No acute osseous abnormality.  CT Chest 10/20/19 [facility], scanned under Media tab): Impression: 1.  Bilateral minimal  pleural effusions. 2.  Bibasilar atelectatic change with additional subsegmental atelectatic change posterior segment right upper lobe.  1V CXR 10/18/19 (post-extubation [facility], scanned under Media tab):  Impression: Interval extubation.  A gastric tube is seen extending below the diaphragm and beyond the field-of-view.  Unchanged position of right sided central venous catheter. The cardiac silhouette is mildly enlarged, unchanged. Mild central pulmonary vascular congestion, slightly progressed compared to prior.  No pleural effusion or pneumothorax. Left basilar opacity favored to be atelectasis though pneumonia should be clinically excluded.   EKG: 10/10/19: NSR   CV: Echo 02/21/20: IMPRESSIONS  1. Left ventricular ejection fraction, by estimation, is 60 to 65%. The  left ventricle has normal function. The left ventricle has no regional  wall motion abnormalities. There is mild left ventricular hypertrophy.  Left ventricular diastolic parameters  are consistent with Grade I diastolic dysfunction (impaired relaxation).  2. Right ventricular systolic function is normal. The right ventricular  size is normal. There is normal pulmonary artery systolic pressure.  3. The mitral valve is normal in structure. Trivial mitral valve  regurgitation. No evidence of mitral stenosis.  4. The aortic valve is normal in structure. Aortic valve regurgitation is  not visualized. No aortic stenosis is present.  5. The inferior vena cava is normal in size with greater  than 50%  respiratory variability, suggesting right atrial pressure of 3 mmHg.   Carotid US 02/21/20: Summary:  - Right Carotid: The extracranial vessels were near-normal with only minimal  wall thickening or plaque.  - Left Carotid: The extracranial vessels were near-normal with only minimal  wall thickening or plaque.  - Vertebrals: Bilateral vertebral arteries demonstrate antegrade flow.  - Subclavians: Normal flow hemodynamics were  seen in bilateral subclavian arteries.   As outlined in [facility] 10/25/19 Discharge Summary (scanned under Media tab, [place], 03/14/20): "-LHC (10/23/2019): 70% LAD, 70% first diagonal ostial lesion, 20% left circumflex, 90% right coronary status post DES, 90% right posterior descending ostial lesion status post DES -CTA coronary (10/21/2019): Proximal LAD 25 to 49%, first diagonal over 70%, proximal circumflex 50 to 69%, proximal RCA over 70%, distal RCA over 100%" (COPY OF 10/21/2019 CTA CORONARY IS SCANNED UNDER MEDIA TAB, [facility], PP. 169-171 OF 215. I DID NOT SEE THAT A COPY OF THE CARDIAC CATH WAS SCANNED.)    Past Medical History:  Diagnosis Date  . CHF (congestive heart failure) ([place])   . Coronary artery disease   . Diabetes mellitus without complication ([place])   . Hypertension   . Myocardial infarct, old    2020, s/p DES RCA, RPDA 10/23/19  . Renal disorder   . Stroke [facility])     Past Surgical History:  Procedure Laterality Date  . ABDOMINAL HYSTERECTOMY    . APPLICATION OF WOUND VAC Right 10/11/2019   Procedure: WOUND VAC CHANGE TO RIGHT LEG;  Surgeon: Shona Needles, MD;  Location: [place];  Service: Orthopedics;  Laterality: Right;  . CORONARY ANGIOPLASTY  10/23/2019   DES RCA, DES RPDA 10/23/19 ([place])  . EYE SURGERY    . HARDWARE REMOVAL Right 10/06/2019   Procedure: HARDWARE REMOVAL;  Surgeon: Shona Needles, MD;  Location: [place];  Service: Orthopedics;  Laterality: Right;  . I & D EXTREMITY Right 10/06/2019   Procedure: IRRIGATION AND DEBRIDEMENT EXTREMITY;  Surgeon: Shona Needles, MD;  Location: [place];  Service: Orthopedics;  Laterality: Right;  . I & D EXTREMITY Right 10/09/2019   Procedure: IRRIGATION AND DEBRIDEMENT EXTREMITY and WOUND VAC CHANGE RIGHT ANKLE;  Surgeon: Shona Needles, MD;  Location: [place];  Service: Orthopedics;  Laterality: Right;  . ORIF ANKLE FRACTURE Right 07/26/2019   Procedure: OPEN REDUCTION INTERNAL FIXATION (ORIF) ANKLE  FRACTURE;  Surgeon: Shona Needles, MD;  Location: [place];  Service: Orthopedics;  Laterality: Right;  OPEN REDUCTION INTERNAL FIXATION (ORIF) ANKLE FRACTURE   . TOE AMPUTATION      MEDICATIONS: . amLODipine (NORVASC) 10 MG tablet  . aspirin EC 81 MG tablet  . atorvastatin (LIPITOR) 80 MG tablet  . buPROPion (WELLBUTRIN XL) 300 MG 24 hr tablet  . clopidogrel (PLAVIX) 75 MG tablet  . Glucagon (GVOKE HYPOPEN 2-PACK) 1 MG/0.2ML SOAJ  . HYDROcodone-acetaminophen (NORCO/VICODIN) 5-325 MG tablet  . insulin degludec (TRESIBA FLEXTOUCH) 200 UNIT/ML FlexTouch Pen  . isosorbide dinitrate (ISORDIL) 20 MG tablet  . levothyroxine (SYNTHROID) 50 MCG tablet  . metoCLOPramide (REGLAN) 5 MG tablet  . metoprolol tartrate (LOPRESSOR) 25 MG tablet  . NOVOLOG 100 UNIT/ML injection  . NOVOLOG FLEXPEN 100 UNIT/ML FlexPen  . pantoprazole (PROTONIX) 40 MG tablet  . Polyethyl Glycol-Propyl Glycol (SYSTANE) 0.4-0.3 % GEL ophthalmic gel  . pregabalin (LYRICA) 75 MG capsule  . ranolazine (RANEXA) 500 MG 12 hr tablet  . sertraline (ZOLOFT) 100 MG tablet  . traMADol Veatrice Bourbon)  50 MG tablet  . TRULICITY 1.5 OD/2.5HQ SOPN   No current facility-administered medications for this encounter.     Myra Gianotti, PA-C Surgical Short Stay/Anesthesiology [facility] Phone [phone] [facility] Phone [phone] 07/15/2020 4:47 PM

## 2020-07-17 ENCOUNTER — Other Ambulatory Visit (HOSPITAL_COMMUNITY)
Admission: RE | Admit: 2020-07-17 | Discharge: 2020-07-17 | Disposition: A | Discharge status: Home / Self Care | Payer: Medicare HMO | Source: Ambulatory Visit | Attending: Student | Admitting: Student

## 2020-07-17 DIAGNOSIS — Z20822 Contact with and (suspected) exposure to covid-19: Secondary | ICD-10-CM | POA: Insufficient documentation

## 2020-07-17 DIAGNOSIS — Z01812 Encounter for preprocedural laboratory examination: Secondary | ICD-10-CM | POA: Diagnosis not present

## 2020-07-17 LAB — SARS CORONAVIRUS 2 (TAT 6-24 HRS): SARS Coronavirus 2: NEGATIVE

## 2020-07-17 NOTE — Telephone Encounter (Signed)
Left message for patient to return call.

## 2020-07-18 ENCOUNTER — Telehealth: Payer: Self-pay | Admitting: Cardiology

## 2020-07-18 DIAGNOSIS — E875 Hyperkalemia: Secondary | ICD-10-CM | POA: Diagnosis not present

## 2020-07-18 DIAGNOSIS — R079 Chest pain, unspecified: Secondary | ICD-10-CM

## 2020-07-18 NOTE — Anesthesia Preprocedure Evaluation (Deleted)
Anesthesia Evaluation    Reviewed: Allergy & Precautions, Patient's Chart, lab work & pertinent test results, reviewed documented beta blocker date and time   History of Anesthesia Complications Negative for: history of anesthetic complications  Airway        Dental   Pulmonary neg pulmonary ROS,           Cardiovascular hypertension, Pt. on medications and Pt. on home beta blockers + CAD, + Past MI, + Cardiac Stents (2020) and +CHF       Neuro/Psych Depression CVA    GI/Hepatic negative GI ROS, Neg liver ROS,   Endo/Other  diabetes, Type 1, Insulin Dependent  Renal/GU Renal disease  negative genitourinary   Musculoskeletal negative musculoskeletal ROS (+)   Abdominal   Peds  Hematology negative hematology ROS (+)   Anesthesia Other Findings Day of surgery medications reviewed with patient.  Reproductive/Obstetrics negative OB ROS                            Anesthesia Physical Anesthesia Plan  ASA: III  Anesthesia Plan: General   Post-op Pain Management: GA combined w/ Regional for post-op pain   Induction: Intravenous  PONV Risk Score and Plan: 3 and Treatment may vary due to age or medical condition, Midazolam and Ondansetron  Airway Management Planned: Oral ETT  Additional Equipment: None  Intra-op Plan:   Post-operative Plan: Extubation in OR  Informed Consent:   Plan Discussed with:   Anesthesia Plan Comments: (See PAT note written 07/18/2020 by Myra Gianotti, PA-C.   History includes DES x2 10/23/19, DM1 (A1c 7.1%), CKD, COPD, legally blind. Cardiologist Dr. Agustin Cree [facility]).  Case cancelled. Patient has not had stress test recommendedby her cardiologist and per his notes should not proceed with elective surgery/be off DAPT without this completed due to her DES in 10/2020 and complex coronary lesions. Discussed with Dr. Doreatha Martin and patient. Daiva Huge, MD )       Anesthesia Quick Evaluation

## 2020-07-18 NOTE — Telephone Encounter (Signed)
New Message:    Pt said she just received word from her surgeon that her surgery have been postponed for tomorrow. He said she needs a Stress Test, she needs this asap please.

## 2020-07-19 ENCOUNTER — Telehealth: Payer: Self-pay | Admitting: Cardiology

## 2020-07-19 ENCOUNTER — Encounter (HOSPITAL_COMMUNITY)
Admission: RE | Disposition: A | Discharge status: Home / Self Care | Payer: Self-pay | Source: Home / Self Care | Attending: Student

## 2020-07-19 ENCOUNTER — Ambulatory Visit (HOSPITAL_COMMUNITY)
Admission: RE | Admit: 2020-07-19 | Discharge: 2020-07-19 | Disposition: A | Discharge status: Home / Self Care | Payer: Medicare HMO | Attending: Student | Admitting: Student

## 2020-07-19 ENCOUNTER — Encounter (HOSPITAL_COMMUNITY): Payer: Self-pay | Admitting: Student

## 2020-07-19 ENCOUNTER — Other Ambulatory Visit: Payer: Self-pay

## 2020-07-19 DIAGNOSIS — M19071 Primary osteoarthritis, right ankle and foot: Secondary | ICD-10-CM | POA: Insufficient documentation

## 2020-07-19 DIAGNOSIS — Z538 Procedure and treatment not carried out for other reasons: Secondary | ICD-10-CM | POA: Diagnosis not present

## 2020-07-19 LAB — GLUCOSE, CAPILLARY: Glucose-Capillary: 81 mg/dL (ref 70–99)

## 2020-07-19 SURGERY — ANKLE FUSION
Anesthesia: General | Site: Ankle | Laterality: Right

## 2020-07-19 MED ORDER — POVIDONE-IODINE 7.5 % EX SOLN
Freq: Once | CUTANEOUS | Status: DC
  Filled 2020-07-19: qty 118

## 2020-07-19 MED ORDER — VANCOMYCIN HCL IN DEXTROSE 1-5 GM/200ML-% IV SOLN: 1000.0000 mg | INTRAVENOUS | Status: DC

## 2020-07-19 MED ORDER — FENTANYL CITRATE (PF) 250 MCG/5ML IJ SOLN
INTRAMUSCULAR | Status: AC
  Filled 2020-07-19: qty 5

## 2020-07-19 MED ORDER — PROPOFOL 10 MG/ML IV BOLUS
INTRAVENOUS | Status: AC
  Filled 2020-07-19: qty 20

## 2020-07-19 MED ORDER — CHLORHEXIDINE GLUCONATE 0.12 % MT SOLN: 15.0000 mL | Freq: Once | OROMUCOSAL | Status: AC

## 2020-07-19 MED ORDER — SODIUM CHLORIDE 0.9 % IV SOLN: INTRAVENOUS | Status: DC

## 2020-07-19 MED ORDER — POVIDONE-IODINE 10 % EX SWAB
2.0000 "application " | Freq: Once | CUTANEOUS | Status: AC
  Administered 2020-07-19: 2 via TOPICAL

## 2020-07-19 MED ORDER — CHLORHEXIDINE GLUCONATE 0.12 % MT SOLN
OROMUCOSAL | Status: AC
  Administered 2020-07-19: 15 mL via OROMUCOSAL
  Filled 2020-07-19: qty 15

## 2020-07-19 MED ORDER — MIDAZOLAM HCL 2 MG/2ML IJ SOLN
INTRAMUSCULAR | Status: AC
  Filled 2020-07-19: qty 2

## 2020-07-19 MED ORDER — ORAL CARE MOUTH RINSE: 15.0000 mL | Freq: Once | OROMUCOSAL | Status: AC

## 2020-07-19 MED ORDER — METOPROLOL TARTRATE 12.5 MG HALF TABLET: 12.5000 mg | ORAL_TABLET | Freq: Once | ORAL | Status: AC

## 2020-07-19 MED ORDER — VANCOMYCIN HCL IN DEXTROSE 1-5 GM/200ML-% IV SOLN
INTRAVENOUS | Status: AC
  Filled 2020-07-19: qty 200

## 2020-07-19 MED ORDER — METOPROLOL TARTRATE 12.5 MG HALF TABLET
ORAL_TABLET | ORAL | Status: AC
  Administered 2020-07-19: 12.5 mg via ORAL
  Filled 2020-07-19: qty 1

## 2020-07-19 MED ORDER — CEFAZOLIN SODIUM-DEXTROSE 2-4 GM/100ML-% IV SOLN: 2.0000 g | INTRAVENOUS | Status: DC

## 2020-07-19 MED ORDER — CEFAZOLIN SODIUM-DEXTROSE 2-4 GM/100ML-% IV SOLN
INTRAVENOUS | Status: AC
  Filled 2020-07-19: qty 100

## 2020-07-19 NOTE — Progress Notes (Signed)
Per anesthesia, case cancelled. Further cardiac testing needed. Dr. Doreatha Martin and Dr. Daiva Huge spoke with patient. Patient taken out in wheel chair by NT to the [place] area. Person to take patient home is Ruby (mom).

## 2020-07-19 NOTE — Telephone Encounter (Signed)
Deanna Landry it looks like we will have to do a stress test.  Please make arrangements for stress test and we can talk about doing surgery

## 2020-07-19 NOTE — Telephone Encounter (Signed)
Deanna Landry is calling stating she went in for surgery this morning and they advised she is needing a stress test per Dr. Wendy Poet request before she can have the surgery. Please put in the active request and call the pt to schedule once entered.

## 2020-07-19 NOTE — Progress Notes (Signed)
Ortho Update  Patient has not undergone stress test as recommended by Dr. Rejeana Brock. Will postpone surgery until she receives her stress test.  Deanna Needles, MD [facility] [phone] (office) [email or web address]

## 2020-07-23 ENCOUNTER — Other Ambulatory Visit: Payer: Self-pay

## 2020-07-23 ENCOUNTER — Ambulatory Visit
Admit: 2020-07-23 | Discharge: 2020-07-23 | Disposition: A | Discharge status: Home / Self Care | Payer: Medicare HMO | Attending: Nephrology | Admitting: Nephrology

## 2020-07-23 DIAGNOSIS — N281 Cyst of kidney, acquired: Secondary | ICD-10-CM | POA: Diagnosis not present

## 2020-07-23 DIAGNOSIS — E1065 Type 1 diabetes mellitus with hyperglycemia: Secondary | ICD-10-CM | POA: Diagnosis not present

## 2020-07-23 DIAGNOSIS — N189 Chronic kidney disease, unspecified: Secondary | ICD-10-CM | POA: Diagnosis not present

## 2020-07-23 DIAGNOSIS — N184 Chronic kidney disease, stage 4 (severe): Secondary | ICD-10-CM

## 2020-07-23 NOTE — Telephone Encounter (Signed)
Moderate risk and chest pain

## 2020-07-23 NOTE — Patient Outreach (Signed)
[facility]) Care Management  07/23/2020  Brigit D Clemenson 1971-02-29 [id number]   Quarterly Telephone Assessment   Unsuccessful outreach attempt.   Plan: RN CM will make outreach attempt to patient within the month of Dec.    Jaidin Ugarte [facility] Management Telephonic Care Management Coordinator Direct Phone: [phone] Toll Free: [phone] Fax: [phone]

## 2020-07-23 NOTE — Patient Outreach (Signed)
[facility]) Care Management  07/23/2020  Deanna Landry [birth year] [id number]   Telephone Assessment   Voicemail message received from patient. Return call placed to patient. Spoke briefly with patient. She reports that she is trying to get things together for her to have upcoming ankle surgery. She was supposed to have surgery last week but it ws cancelled as she did not have cardiac clearance and stress test completed. Patient requesting contact info for her cardiology office listed on file as she states she does not know who tot contact them. RN CM provided patient with MD office contact info. RN CM will follow back up with patient at later time when she is able to talk at length.      Plan: RN CM discussed with patient next outreach within a month. Patient gave verbal consent and in agreement with RN CM follow up and timeframe. Patient aware that they may contact RN CM sooner for any issues or concerns.  Enzo Montgomery, RN,BSN,CCM [place] Management Telephonic Care Management Coordinator Direct Phone: [phone] Toll Free: [phone] Fax: [phone]

## 2020-07-24 ENCOUNTER — Ambulatory Visit: Payer: Self-pay

## 2020-07-24 NOTE — Telephone Encounter (Signed)
Left message for patient to return call.

## 2020-07-24 NOTE — Telephone Encounter (Signed)
Called patient informed her of stress test instructions and appointment for stress test. She knows to hold metoprolol and diabetic medicine the day of the test. No further questions.

## 2020-07-24 NOTE — Telephone Encounter (Signed)
Please see additional phone encounter.

## 2020-07-24 NOTE — Telephone Encounter (Signed)
Please see additional phone call.

## 2020-07-24 NOTE — Telephone Encounter (Signed)
Patient is calling to follow up regarding request to have stress test. No order available.

## 2020-07-28 ENCOUNTER — Other Ambulatory Visit: Payer: Self-pay | Admitting: Endocrinology

## 2020-07-30 ENCOUNTER — Encounter: Payer: Self-pay | Admitting: *Deleted

## 2020-07-30 ENCOUNTER — Telehealth: Payer: Self-pay | Admitting: *Deleted

## 2020-07-30 NOTE — Telephone Encounter (Signed)
Left message on voicemail in reference to upcoming appointment scheduled for 08/06/2020. Phone number given for a call back so details instructions can be given. [place] Mychart letter sent with instructions

## 2020-08-05 ENCOUNTER — Telehealth: Payer: Self-pay | Admitting: Cardiology

## 2020-08-05 NOTE — Telephone Encounter (Signed)
Spoke with the pt and she verbalized understanding of her stress test instructions for 08/06/20.

## 2020-08-05 NOTE — Telephone Encounter (Signed)
Left a message for the pt to call our office back.

## 2020-08-05 NOTE — Telephone Encounter (Signed)
New Message:      Pt says she is scheduled for a Stress Test tomorrow and needs her instructions please.

## 2020-08-06 ENCOUNTER — Other Ambulatory Visit: Payer: Self-pay

## 2020-08-06 ENCOUNTER — Ambulatory Visit (INDEPENDENT_AMBULATORY_CARE_PROVIDER_SITE_OTHER): Payer: Medicare HMO

## 2020-08-06 DIAGNOSIS — R079 Chest pain, unspecified: Secondary | ICD-10-CM

## 2020-08-06 LAB — MYOCARDIAL PERFUSION IMAGING
LV dias vol: 53 mL (ref 46–106)
LV sys vol: 19 mL
Peak HR: 83 {beats}/min
Rest HR: 64 {beats}/min
SDS: 4
SRS: 0
SSS: 4
TID: 1.22

## 2020-08-06 MED ORDER — REGADENOSON 0.4 MG/5ML IV SOLN
0.4000 mg | Freq: Once | INTRAVENOUS | Status: AC
Start: 2020-08-06 — End: 2020-08-06
  Administered 2020-08-06: 0.4 mg via INTRAVENOUS

## 2020-08-06 MED ORDER — TECHNETIUM TC 99M TETROFOSMIN IV KIT
10.7000 | PACK | Freq: Once | INTRAVENOUS | Status: AC | PRN
  Administered 2020-08-06: 10.7 via INTRAVENOUS

## 2020-08-06 MED ORDER — TECHNETIUM TC 99M TETROFOSMIN IV KIT
30.6000 | PACK | Freq: Once | INTRAVENOUS | Status: AC | PRN
  Administered 2020-08-06: 30.6 via INTRAVENOUS

## 2020-08-08 DIAGNOSIS — I679 Cerebrovascular disease, unspecified: Secondary | ICD-10-CM | POA: Diagnosis not present

## 2020-08-08 DIAGNOSIS — I10 Essential (primary) hypertension: Secondary | ICD-10-CM | POA: Diagnosis not present

## 2020-08-08 DIAGNOSIS — N184 Chronic kidney disease, stage 4 (severe): Secondary | ICD-10-CM | POA: Diagnosis not present

## 2020-08-08 DIAGNOSIS — J449 Chronic obstructive pulmonary disease, unspecified: Secondary | ICD-10-CM | POA: Diagnosis not present

## 2020-08-08 DIAGNOSIS — G5603 Carpal tunnel syndrome, bilateral upper limbs: Secondary | ICD-10-CM | POA: Diagnosis not present

## 2020-08-08 DIAGNOSIS — M159 Polyosteoarthritis, unspecified: Secondary | ICD-10-CM | POA: Diagnosis not present

## 2020-08-08 DIAGNOSIS — M79671 Pain in right foot: Secondary | ICD-10-CM | POA: Diagnosis not present

## 2020-08-08 DIAGNOSIS — R110 Nausea: Secondary | ICD-10-CM | POA: Diagnosis not present

## 2020-08-08 DIAGNOSIS — G4733 Obstructive sleep apnea (adult) (pediatric): Secondary | ICD-10-CM | POA: Diagnosis not present

## 2020-08-08 DIAGNOSIS — E118 Type 2 diabetes mellitus with unspecified complications: Secondary | ICD-10-CM | POA: Diagnosis not present

## 2020-08-08 LAB — HEMOGLOBIN A1C: Hemoglobin A1C: 7.5

## 2020-08-08 LAB — BASIC METABOLIC PANEL
Creatinine: 1.9 — AB (ref 0.5–1.1)
Glucose: 46

## 2020-08-12 ENCOUNTER — Ambulatory Visit (INDEPENDENT_AMBULATORY_CARE_PROVIDER_SITE_OTHER): Payer: Medicare HMO | Admitting: Endocrinology

## 2020-08-12 ENCOUNTER — Other Ambulatory Visit: Payer: Self-pay

## 2020-08-12 VITALS — BP 128/78 | HR 80 | Ht 62.0 in | Wt 174.4 lb

## 2020-08-12 DIAGNOSIS — E1065 Type 1 diabetes mellitus with hyperglycemia: Secondary | ICD-10-CM | POA: Diagnosis not present

## 2020-08-12 LAB — POCT GLUCOSE (DEVICE FOR HOME USE): POC Glucose: 208 mg/dl — AB (ref 70–99)

## 2020-08-12 MED ORDER — GLUCOSE BLOOD VI STRP: ORAL_STRIP | 12 refills | Status: DC

## 2020-08-12 MED ORDER — EASY COMFORT PEN NEEDLES 31G X 8 MM MISC: 2 refills | Status: DC

## 2020-08-12 NOTE — Patient Instructions (Signed)
Tresiba 14 units and if am sugar is < 80 them go to 13

## 2020-08-12 NOTE — Progress Notes (Signed)
Patient ID: Deanna Landry, female   DOB: 19-Jun-1971, 49 y.o.   MRN: [id number]           Reason for Appointment : Follow-up for Type 1 Diabetes  History of Present Illness   Referring HCP:Lee, Keung         Diagnosis: Type 1 diabetes mellitus, date of diagnosis: Age 57         Previous history:   She has been on insulin since her diagnosis when she had significant symptomatic hypoglycemia. Has been on various types of insulin regimens in the past but usually not followed by endocrinologist The last 5 or 6 years she has been on Levemir and NovoLog Previous level of control is variable with A1c only inconsistently below 7  Recent history:   INSULIN regimen:  BASAL insulin: 15 units Tresiba daily  Mealtime insulin: NovoLog 2-6 units before meals  Non-insulin hypoglycemic drugs: Trulicity 1.5 mg weekly  A1c is last 7.5   compared to 6.3 done on 02/02/2020  Current management, blood sugar patterns and problems identified:    She still appears to be getting falsely low readings from her freestyle libre version 2  Today her blood sugar was 185 on her libre and 205 on the fingerstick  However she is still overreacting to mildly low sugars at home and will rebound significantly from excessive carbohydrates when blood sugar is low  Today she did not take any insulin for breakfast even though she had 2 slices of bread for fear of low sugars when coming here  Also still not adjusting her mealtime dose based on what she is eating, yesterday at lunchtime she had only a small serving of noodles with other low-fat foods and took 5 units causing her blood sugar to be relatively low  Basal insulin is difficult to adjust since her morning sugars are highly variable   She also tends to snack late at night causing overall her blood sugars to start going up  Higher fasting blood sugar today was excellent at 97  Basal insulin: Still supposed to reduce the dose to 14 units but is still taking  15 units of [place]  She has continued her Trulicity because she thought her blood sugars and appetite were increased without it previously   CONTINUOUS GLUCOSE MONITORING RECORD INTERPRETATION    Dates of Recording: Last 2 weeks  Sensor description: [name] version 2  Results statistics:   CGM use % of time 84  Average and SD  158  Time in range  55     %, was 50  % Time Above 180  19  % Time above 250  13  % Time Below target  13, was 7    PRE-MEAL Fasting Lunch Dinner Bedtime Overall  Glucose range:       Mean/median:  192  146  153   158   POST-MEAL PC Breakfast PC Lunch PC Dinner  Glucose range:     Mean/median:  171  132  137    Glycemic patterns summary: Blood sugars are highly variable with daily blood sugar averaging anywhere between 109 and 239 All the highest blood sugars are generally early morning and late evening there is no consistent pattern also.  Also hypoglycemia has occurred more than half the days in the last 2 weeks and greater when compared to her previous tracing  Hyperglycemic episodes are occurring to a variable extent overnight midmorning or late afternoon HIGHEST blood sugar and average are between 4-6  AM and lowest 10 PM to 12 AM averaging 108  Hypoglycemic episodes occurred 3 times after midnight, at least 3 times before lunch and few times in the afternoons and early evenings other teeth  Overnight periods: Blood sugars are highly variable but.  To both hypoglycemia and hyperglycemia and no consistent pattern  Preprandial periods: These are highly variable based on her blood sugar pattern from day-to-day  Postprandial periods:   Has occasional mealtime spikes after breakfast or dinnertime but no consistent pattern, also a couple of times has had low sugars in the early afternoon possibly from lunch bolus  Previous readings:  CGM use % of time  90  2-week average/SD  180, was 148  Time in range      50% was 55  % Time Above 180  19  %  Time above 250  24  % Time Below 70  7     PRE-MEAL Fasting Lunch Dinner  4-6 AM Overall  Glucose range:       Averages:  179  144  154  245    POST-MEAL PC Breakfast PC Lunch PC Dinner  Glucose range:     Averages:   139  186     Hypoglycemia:  occurs mostly overnight Factors causing hyperglycemia: Excessive Levemir at night Symptoms of hypoglycemia:none or minimal Treatment of hypoglycemia: Juices, if patient is unconscious mother will call the ambulance         Self-care: The diet that the patient has been following is: None, does not know carbohydrate counting Immediately skipping breakfast, lunch will be assignments, dinner usually consists of bread or pasta Snacks will be peanut butter crackers, yogurt, cottage cheese, fruit Eating out only about once or twice a month   Mealtimes are: Breakfast none Lunch: 1 pm Dinner: 6pm         Exercise: None          Dietician consultation none.          Diabetes labs:  Lab Results  Component Value Date   HGBA1C 7.1 (A) 04/26/2020   HGBA1C 7.7 (H) 10/05/2019   HGBA1C 6.8 (H) 03/10/2019   Lab Results  Component Value Date   CREATININE 2.14 (H) 07/12/2020    No results found for: MICRALBCREAT   Allergies as of 08/12/2020   No Known Allergies     Medication List       Accurate as of August 12, 2020  1:09 PM. If you have any questions, ask your nurse or doctor.        amLODipine 10 MG tablet Commonly known as: NORVASC Take 10 mg by mouth daily.   aspirin EC 81 MG tablet Take 81 mg by mouth daily.   atorvastatin 80 MG tablet Commonly known as: LIPITOR Take 80 mg by mouth daily.   buPROPion 300 MG 24 hr tablet Commonly known as: WELLBUTRIN XL Take 300 mg by mouth daily.   clopidogrel 75 MG tablet Commonly known as: PLAVIX Take 75 mg by mouth daily.   Gvoke HypoPen 2-Pack 1 MG/0.2ML Soaj Generic drug: Glucagon Inject 1 mg into the skin as needed. For severe lows   HYDROcodone-acetaminophen 5-325 MG  tablet Commonly known as: NORCO/VICODIN as needed.   isosorbide dinitrate 20 MG tablet Commonly known as: ISORDIL Take 20 mg by mouth 2 (two) times daily.   levothyroxine 50 MCG tablet Commonly known as: SYNTHROID Take 50 mcg by mouth daily.   metoCLOPramide 5 MG tablet Commonly known as: REGLAN Take 1  tablet by mouth daily.   metoprolol tartrate 25 MG tablet Commonly known as: LOPRESSOR Take 12.5 mg by mouth 2 (two) times daily.   NovoLOG FlexPen 100 UNIT/ML FlexPen Generic drug: insulin aspart Inject 4-10 Units into the skin 3 (three) times daily.   NovoLOG 100 UNIT/ML injection Generic drug: insulin aspart SMARTSIG:10 Unit(s) SUB-Q 3 Times Daily PRN   pantoprazole 40 MG tablet Commonly known as: PROTONIX Take 40 mg by mouth daily.   pregabalin 75 MG capsule Commonly known as: LYRICA Take 75 mg by mouth 2 (two) times daily.   ranolazine 500 MG 12 hr tablet Commonly known as: Ranexa Take 1 tablet (500 mg total) by mouth 2 (two) times daily.   sertraline 100 MG tablet Commonly known as: ZOLOFT Take 100 mg by mouth daily.   Systane 0.4-0.3 % Gel ophthalmic gel Generic drug: Polyethyl Glycol-Propyl Glycol Place 1 application into both eyes 3 (three) times daily as needed (dry eye).   traMADol 50 MG tablet Commonly known as: ULTRAM Take 1 tablet by mouth daily.   [name] FlexTouch 200 UNIT/ML FlexTouch Pen Generic drug: insulin degludec INJECT 30 UNITS INTO THE SKIN DAILY.   Trulicity 1.5 WJ/1.9JY Sopn Generic drug: Dulaglutide Inject 1.5 mg into the skin every Monday.       Allergies: No Known Allergies  Past Medical History:  Diagnosis Date   CHF (congestive heart failure) (HCC)    Coronary artery disease    Diabetes mellitus without complication ([place])    Hypertension    Myocardial infarct, old    2020, s/p DES RCA, RPDA 10/23/19   Renal disorder    Stroke [facility])     Past Surgical History:  Procedure Laterality Date   ABDOMINAL  HYSTERECTOMY     APPLICATION OF WOUND VAC Right 10/11/2019   Procedure: WOUND VAC CHANGE TO RIGHT LEG;  Surgeon: Shona Needles, MD;  Location: [place];  Service: Orthopedics;  Laterality: Right;   CORONARY ANGIOPLASTY  10/23/2019   DES RCA, DES RPDA 10/23/19 [facility])   EYE SURGERY     HARDWARE REMOVAL Right 10/06/2019   Procedure: HARDWARE REMOVAL;  Surgeon: Shona Needles, MD;  Location: [place];  Service: Orthopedics;  Laterality: Right;   I & D EXTREMITY Right 10/06/2019   Procedure: IRRIGATION AND DEBRIDEMENT EXTREMITY;  Surgeon: Shona Needles, MD;  Location: [place];  Service: Orthopedics;  Laterality: Right;   I & D EXTREMITY Right 10/09/2019   Procedure: IRRIGATION AND DEBRIDEMENT EXTREMITY and WOUND VAC CHANGE RIGHT ANKLE;  Surgeon: Shona Needles, MD;  Location: [place];  Service: Orthopedics;  Laterality: Right;   ORIF ANKLE FRACTURE Right 07/26/2019   Procedure: OPEN REDUCTION INTERNAL FIXATION (ORIF) ANKLE FRACTURE;  Surgeon: Shona Needles, MD;  Location: [place];  Service: Orthopedics;  Laterality: Right;  OPEN REDUCTION INTERNAL FIXATION (ORIF) ANKLE FRACTURE    TOE AMPUTATION      Family History  Problem Relation Age of Onset   Diabetes Mellitus II Mother    Stroke Mother    Heart attack Mother    Stomach cancer Father    Congestive Heart Failure Father    Diabetes Sister    Congestive Heart Failure Maternal Grandfather     Social History:  reports that she has never smoked. She has never used smokeless tobacco. She reports previous alcohol use. She reports previous drug use.      Review of Systems  HYPOTHYROIDISM: Treated by PCP with levothyroxine 50 mcg and last TSH 4.7  done in August      Lipids: Last LDL was 64 in June 2021 She is on a statin drug using 80 mg Lipitor  No results found for: CHOL, HDL, LDLCALC, LDLDIRECT, TRIG, CHOLHDL  Last dilated eye exam was in 3/21  Peripheral neuropathy: She will get some pains and paresthesia in her legs  treated with Lyrica and tramadol.  Also has numbness in her hands  DIABETES COMPLICATIONS: Neuropathy, retinopathy nephropathy   Renal insufficiency: Her last creatinine was 1.9 and fluctuates  Lab Results  Component Value Date   CREATININE 2.14 (H) 07/12/2020   CREATININE 1.76 (H) 10/12/2019   CREATININE 1.86 (H) 10/11/2019     LABS:  Appointment on 08/06/2020  Component Date Value Ref Range Status   Rest HR 08/06/2020 64  bpm Final   Rest BP 08/06/2020 120/62  mmHg Final   Peak HR 08/06/2020 83  bpm Final   Peak BP 08/06/2020 122/60  mmHg Final   SSS 08/06/2020 4   Final   SRS 08/06/2020 0   Final   SDS 08/06/2020 4   Final   TID 08/06/2020 1.22   Final   LV sys vol 08/06/2020 19  mL Final   LV dias vol 08/06/2020 53  46 - 106 mL Final    Physical Examination:  BP 128/78 (BP Location: Right Arm, Patient Position: Sitting, Cuff Size: Normal)    Pulse 80    Ht 5\' 2"  (1.575 m)    Wt 174 lb 6.4 oz (79.1 kg)    LMP  (LMP Unknown) Comment: hysterectomy 2014   SpO2 97%    BMI 31.90 kg/m     ASSESSMENT:  Diabetes type 1, on basal bolus insulin  A1c 7.5%  However she has significant variability in her blood sugars as discussed above and still needs better management Also will benefit from a more accurate glucose monitor and closed-loop insulin pump long-term  HYPOTHYROIDISM patient is 4.7 followed by PCP, she continues on her levothyroxine 50 mcg   PLAN:   1. Glucose monitoring:  She will try to compare her freestyle libre with fingersticks with the Accu-Chek meter given today  2.  Diabetes education: This needs to be redone  3. Insulin changes prescribed:  [name] will be reduced to 14 units and if morning sugars are still relatively low she can cut it down to 13  Continue Trulicity weekly as before  She will need to take approximately 1 unit for every 10 g of carbohydrate and 2 to 4 units for most meals  She will reduce the dose to 2 units of  having only 1 serving of carbohydrate  To bolus before eating consistently  Avoid overtreating low sugar  Check blood sugar with the Accu-Chek meter that was provided today before treating any low blood sugars  Avoid overtreating low sugars  She will switch to the [facility] instead of the freestyle libre for better accuracy, explained to her how the sensor is used and the benefits compared to freestyle.  Patient brochure and contact information given  She was also explained the use of the T-slim insulin pump and the concept of the closed-loop system and brochure was given  Avoid high-fat meals   5. Follow-up: 8 weeks  There are no Patient Instructions on file for this visit.    Elayne Snare 08/12/2020, 1:09 PM      Note: This note was prepared with Dragon voice recognition system technology. Any transcriptional errors that result from this process are  unintentional.

## 2020-08-13 ENCOUNTER — Telehealth: Payer: Self-pay | Admitting: Cardiology

## 2020-08-13 ENCOUNTER — Telehealth: Payer: Self-pay | Admitting: Endocrinology

## 2020-08-13 NOTE — Telephone Encounter (Signed)
Called patient informed her of results.  

## 2020-08-13 NOTE — Telephone Encounter (Signed)
Patient says she needs a Dexcom G6 and a T-Slim X2 called into [employer]. Patient ph# [phone]

## 2020-08-13 NOTE — Telephone Encounter (Signed)
  Patient is returning call regarding lexiscan results

## 2020-08-14 MED ORDER — DEXCOM G6 RECEIVER DEVI: 0 refills | Status: DC

## 2020-08-14 MED ORDER — DEXCOM G6 TRANSMITTER MISC: 3 refills | Status: DC

## 2020-08-14 MED ORDER — DEXCOM G6 SENSOR MISC: 6 refills | Status: DC

## 2020-08-14 NOTE — Telephone Encounter (Signed)
Message was left on voice mail, that she needs to go online to [email or web address] to order her pump. An insurance verification process will occur, she ill be notified of her cost, if she approves, a prescription will be sent to Dr Dwyane Dee, and then it will go to a distributor, who will sent it to her.  She was given my number to call when she gets the pump. The Dexcom G6 can be ordered to her pharmacy.

## 2020-08-14 NOTE — Telephone Encounter (Signed)
Dexcom has been sent to [facility]

## 2020-08-14 NOTE — Addendum Note (Signed)
Addended by: Jefferson Fuel on: 08/14/2020 10:31 AM   Modules accepted: Orders

## 2020-08-14 NOTE — Telephone Encounter (Signed)
Please call in the Dexcom sensors, transmitter and receiver to the [place].  She needs to call the T-Slim company herself from the brochure given to get approval for the pump

## 2020-08-15 DIAGNOSIS — I5042 Chronic combined systolic (congestive) and diastolic (congestive) heart failure: Secondary | ICD-10-CM | POA: Diagnosis not present

## 2020-08-21 ENCOUNTER — Other Ambulatory Visit (HOSPITAL_COMMUNITY)
Admission: RE | Admit: 2020-08-21 | Discharge: 2020-08-21 | Disposition: A | Discharge status: Home / Self Care | Payer: Medicare HMO | Source: Ambulatory Visit | Attending: Student | Admitting: Student

## 2020-08-21 ENCOUNTER — Other Ambulatory Visit: Payer: Self-pay

## 2020-08-21 ENCOUNTER — Encounter (HOSPITAL_COMMUNITY): Payer: Self-pay | Admitting: Student

## 2020-08-21 DIAGNOSIS — Z20822 Contact with and (suspected) exposure to covid-19: Secondary | ICD-10-CM | POA: Diagnosis not present

## 2020-08-21 DIAGNOSIS — Z01818 Encounter for other preprocedural examination: Secondary | ICD-10-CM | POA: Insufficient documentation

## 2020-08-21 LAB — SARS CORONAVIRUS 2 (TAT 6-24 HRS): SARS Coronavirus 2: NEGATIVE

## 2020-08-21 NOTE — Patient Outreach (Signed)
[place]) Care Management  08/21/2020  Alonni D Ficco October 25, 1971 [id number]   Telephone Assessment Quarterly Call   Unsuccessful outreach attempt to patient.      Plan: RN CM will make quarterly outreach attempt to patient within the month of Jan if no return call from patient.  Enzo Montgomery, RN,BSN,CCM [place] Management Telephonic Care Management Coordinator Direct Phone: [phone] Toll Free: [phone] Fax: [phone]

## 2020-08-21 NOTE — Progress Notes (Signed)
PCP - Dr Cher Nakai Cardiologist - Dr Jenne [facility] Internal Med - Dr Elayne Snare  Chest x-ray - n/a EKG - 10/10/19 Stress Test - 08/06/20 ECHO - 02/21/20 Cardiac Cath - 10/2019   Sleep Study -  On 9/27 & 08/13/20 - no results as of 08/21/20 per patient.  Fasting Blood Sugar - 60-80s Checks Blood Sugar 10 times a day.  Patient has a [employer] to check blood sugars. Patient has ordered a Dexcon G6 but not arrived as of 08/21/20.    Marland Kitchen THE MORNING OF SURGERY, take 12 units Tresiba insulin.  Do not take Novolog Insulin unless your CBG is greater than 220 mg/dL, you may take  of your sliding scale (correction) dose of insulin.  . The day of surgery, do not take other diabetes injectables (Trulicity)  . If your blood sugar is less than 70 mg/dL, you will need to treat for low blood sugar: o Treat a low blood sugar (less than 70 mg/dL) with  cup of clear juice (cranberry or apple), 4 glucose tablets, OR glucose gel. o Recheck blood sugar in 15 minutes after treatment (to make sure it is greater than 70 mg/dL). If your blood sugar is not greater than 70 mg/dL on recheck, call [phone] for further instructions.  Blood Thinner Instructions:  Plavix last dose was on 08/20/20 per patient.    Aspirin Instructions: Follow your surgeon's instructions on when to stop aspirin prior to surgery,  If no instructions were given by your surgeon then you will need to call the office for those instructions.  Anesthesia review: Yes  STOP now taking any Aspirin (unless otherwise instructed by your surgeon), Aleve, Naproxen, Ibuprofen, Motrin, Advil, Goody's, BC's, all herbal medications, fish oil, and all vitamins.   Coronavirus Screening Covid test on 08/21/20 Do you have any of the following symptoms:  Cough yes/no: No Fever (>100.61F)  yes/no: No Runny nose yes/no: No Sore throat yes/no: No Difficulty breathing/shortness of breath  yes/no: No  Have you traveled in the last 14 days and  where? yes/no: No  Patient verbalized understanding of instructions that were given via phone.

## 2020-08-22 DIAGNOSIS — G5603 Carpal tunnel syndrome, bilateral upper limbs: Secondary | ICD-10-CM | POA: Diagnosis not present

## 2020-08-22 DIAGNOSIS — J449 Chronic obstructive pulmonary disease, unspecified: Secondary | ICD-10-CM | POA: Diagnosis not present

## 2020-08-22 DIAGNOSIS — I679 Cerebrovascular disease, unspecified: Secondary | ICD-10-CM | POA: Diagnosis not present

## 2020-08-22 DIAGNOSIS — I509 Heart failure, unspecified: Secondary | ICD-10-CM | POA: Diagnosis not present

## 2020-08-22 DIAGNOSIS — R110 Nausea: Secondary | ICD-10-CM | POA: Diagnosis not present

## 2020-08-22 DIAGNOSIS — N184 Chronic kidney disease, stage 4 (severe): Secondary | ICD-10-CM | POA: Diagnosis not present

## 2020-08-22 DIAGNOSIS — M79671 Pain in right foot: Secondary | ICD-10-CM | POA: Diagnosis not present

## 2020-08-22 DIAGNOSIS — M159 Polyosteoarthritis, unspecified: Secondary | ICD-10-CM | POA: Diagnosis not present

## 2020-08-22 DIAGNOSIS — G4733 Obstructive sleep apnea (adult) (pediatric): Secondary | ICD-10-CM | POA: Diagnosis not present

## 2020-08-22 NOTE — Anesthesia Preprocedure Evaluation (Deleted)
Anesthesia Evaluation    Airway        Dental   Pulmonary           Cardiovascular hypertension,      Neuro/Psych    GI/Hepatic   Endo/Other  diabetes  Renal/GU      Musculoskeletal   Abdominal   Peds  Hematology   Anesthesia Other Findings   Reproductive/Obstetrics                             Anesthesia Physical Anesthesia Plan  ASA:   Anesthesia Plan:    Post-op Pain Management:    Induction:   PONV Risk Score and Plan:   Airway Management Planned:   Additional Equipment:   Intra-op Plan:   Post-operative Plan:   Informed Consent:   Plan Discussed with:   Anesthesia Plan Comments: (See PAT note written 08/22/2020 by Myra Gianotti, PA-C. History includes DM1, NSTEMI (in setting of DKA/septic shock) s/p DES RCA and RPDA 10/23/19, CKD stage III-IV, legally blind. Non-ischemic stress test 08/06/20 per Dr. Agustin Cree as part of pre-operative evaluation since Plavix held for surgery.    )        Anesthesia Quick Evaluation

## 2020-08-22 NOTE — Anesthesia Preprocedure Evaluation (Signed)
Anesthesia Evaluation    Reviewed: Allergy & Precautions, H&P , Patient's Chart, lab work & pertinent test results  History of Anesthesia Complications Negative for: history of anesthetic complications  Airway Mallampati: II  TM Distance: >3 FB Neck ROM: Full    Dental no notable dental hx. (+) Edentulous Upper, Edentulous Lower, Dental Advisory Given   Pulmonary COPD,  COPD inhaler,    Pulmonary exam normal breath sounds clear to auscultation       Cardiovascular hypertension, Pt. on medications and Pt. on home beta blockers + CAD, + Past MI, + Cardiac Stents and +CHF   Rhythm:Regular Rate:Normal  Nuclear stress test 08/06/20: Study Highlights  The left ventricular ejection fraction is normal (55-65%).  Nuclear stress EF: 65%.  The study is normal.  This is a low risk study.   Echo 02/21/20: IMPRESSIONS  1. Left ventricular ejection fraction, by estimation, is 60 to 65%. The  left ventricle has normal function. The left ventricle has no regional  wall motion abnormalities. There is mild left ventricular hypertrophy.  Left ventricular diastolic parameters  are consistent with Grade I diastolic dysfunction (impaired relaxation).  2. Right ventricular systolic function is normal. The right ventricular  size is normal. There is normal pulmonary artery systolic pressure.  3. The mitral valve is normal in structure. Trivial mitral valve  regurgitation. No evidence of mitral stenosis.  4. The aortic valve is normal in structure. Aortic valve regurgitation is  not visualized. No aortic stenosis is present.  5. The inferior vena cava is normal in size with greater than 50%  respiratory variability, suggesting right atrial pressure of 3 mmHg.     Neuro/Psych Depression CVA, No Residual Symptoms    GI/Hepatic Neg liver ROS, GERD  ,  Endo/Other  diabetes, Insulin Dependent, Oral Hypoglycemic AgentsHypothyroidism    Renal/GU Renal InsufficiencyRenal disease  negative genitourinary   Musculoskeletal   Abdominal   Peds  Hematology negative hematology ROS (+)   Anesthesia Other Findings   Reproductive/Obstetrics negative OB ROS                             Anesthesia Physical  Anesthesia Plan  ASA: III  Anesthesia Plan: General   Post-op Pain Management:  Regional for Post-op pain   Induction: Intravenous  PONV Risk Score and Plan: 4 or greater and Ondansetron, Midazolam, Diphenhydramine and Dexamethasone  Airway Management Planned: LMA and Oral ETT  Additional Equipment:   Intra-op Plan:   Post-operative Plan: Extubation in OR  Informed Consent:   Plan Discussed with:   Anesthesia Plan Comments: (See PAT note written 08/22/2020 by Myra Gianotti, PA-C. History includes DM1, NSTEMI (in setting of DKA/septic shock) s/p DES RCA and RPDA 10/23/19, CKD stage III-IV, legally blind. Non-ischemic stress test 08/06/20 per Dr. Agustin Cree as part of pre-operative evaluation since Plavix held for surgery.  )        Anesthesia Quick Evaluation

## 2020-08-22 NOTE — Progress Notes (Signed)
Anesthesia Chart Review: Deanna Landry   Case: [id number] Date/Time: 08/23/20 0715   Procedures:      ANKLE FUSION (Right Ankle)     HARDWARE REMOVAL (Right )   Anesthesia type: General   Pre-op diagnosis: ARTHRITIS RIGHT ANKLE   Location: [facility] OR ROOM 05 / [place] OR   Surgeons: Deanna Needles, MD      DISCUSSION: [name] scheduled for the above procedure. She is s/p DES 10/23/19. Surgery was initially scheduled for 07/19/20, but if anti-platelet therapy needed to be interrupted for surgery then her cardiologist Dr. Agustin Landry recommended a preoperative stress test. She initially refused to get the stress test, but after surgery was cancelled she did agree to getting the stress test which was done on 08/06/20 and was non-ischemic.     History includes never smoker, DM1 (diagnosed age 89), diabetic foot ulcers (s/p right 2nd toe amputation), CHF (LVEF 84-66%, grade 1 diastolic dysfunction 03/24/92 echo), CAD (NSTEMI in setting of DKA/septic shock; s/p DES RCA and RPDA 10/23/19 [facility]), CVA, CKD (stage III-IV), COPD (history of 2L O2), hysterectomy (2014). BMI is consistent with obesity. Legally blind by notes.   Several admissions over the past year:  - [facility]) ED evaluation 10/26/19 for epistaxis, s/p Afrin treatment.  - [facility] Admission 10/15/19-10/25/19 for DKA, septic shock, acute encephalopathy, PNA,  acute respiratory failure, AKI (Cr 2.73, baseline CKD3), NSTEMI (s/p DES to RCA and RPDA 10/23/19). Apparently, she initially presented to [facility] and was intubated there, but transferred to [facility] due to her critical nature and was admitted there to the ICU and treated with pressors for hypotension/shock. COVID-19 negative. Endocrinology consulted for DKA/DM1 management. Extubated by 10/18/19. She had a markedly elevated troponin (> 14K) which prompted cardiology evaluation and ultimately PCI. ASA and Plavix recommended for 1 year. Out-patient follow-up with  cardiology, primary care, orthopedics arranged.  - [place] Admission 10/03/19-10/12/19 for early DKA with right ankle post-operative cellulitis/infection, s/p I&D, removal of hardware right ankle, placement of wound VAC 10/06/19 ,and I&D, wound VAC replacement 10/09/19 and secondary closure of right ankle wound 10/11/19. Antibiotic coverage for MSSA+ wound cultures. Also treated with Miralax and enema for post-operative ileus. - [place] Admission 07/25/19-07/29/19 for right ankle fracture from mechanical fall, s/p ORIF of right pilon fracture (fibula and tibia) 07/25/19.     Last cardiology evaluation was on 07/08/20 with Dr. Agustin Landry for follow-up and preoperative evaluation. Given her DES x2 in 10/2019 and "complex anatomy", he preferred that she not interrupt dual antiplatelet therapy unless absolutely necessary. If surgery cannot be postponed and anti-platelet therapy held then he felt "there will be some risk of this procedure on top of that she does not have good exercise tolerance and is she is a high risk for coronary artery point review", and would recommend a stress test. Previously, Dr. Doreatha Landry had wanted her to hold Plavix but was okay with her continuing ASA if needed. Last Plavix 08/20/20. Non-ischemic stress test 08/06/20.  Last evaluation by endocrinologist Dr. Dwyane Landry 08/12/20. A1c 7.5% on 08/08/20. He felt she needed more diabetes education. Insulin adjusted, and he was changing her to the [facility] instead of the [facility] due to some inconsistent readings. ([facility] not yet delivered as of 08/21/20.)  As of 08/08/20, Cr 1.9. Known CKD stage III. In review of [facility] records and [facility] records scanned under Media tab, Cr 1.54-2.95 since 09/2019 and ~ 2.14-2.73 during NSTEMI CME admission ~ 10/2019. Her Cr  was 2.34 on 05/06/20 by labs received from primary care. Had renal US 07/23/20 (ordered by nephrologist Dr. Candiss Landry) showing findings consistent with chronic medical renal diseaes and a 2 cm complex  right renal cyst felt likely benign. She is a same day work-up, so she will need updated labs on the day of surgery.   She reported a recent home sleep study ~ 08/12/20, but had not received the results as of 08/21/20.   Preoperative COVID-19 test negative on 08/21/2020.  Anesthesia team to evaluate on the day of surgery.   VS: Ht 5\' 2"  (1.575 m)   Wt 79.4 kg   LMP  (LMP Unknown) Comment: hysterectomy 2014  BMI 32.01 kg/m   BP Readings from Last 3 Encounters:  08/12/20 128/78  07/19/20 (!) 130/53  07/12/20 122/81   Pulse Readings from Last 3 Encounters:  08/12/20 80  07/19/20 68  07/12/20 81    PROVIDERS: Deanna Nakai, MD is PCP [facility]). 07/15/20 office note received on 07/18/20. She was referred to nephrology and for home sleep study. Cr was 2.34 there on 05/06/20. Deanna Campus, MD is cardiologist - Deanna Snare, MD is endocrinologist - Deanna Quint, MD is nephrologist   LABS: For day of surgery. See DISCUSSION.   IMAGES: Xray Right foot 06/24/20 [facility] [place]/PACS): FINDINGS: Amputation of the second digit at the level of the distal portion of the proximal phalanx. Mild hallux valgus deformity with secondary osteoarthritis of the first metatarsophalangeal joint. Surgical changes within the ankle, suboptimally evaluated. No acute fracture or dislocation. Remote distal tibia and fibular fractures. IMPRESSION: No acute osseous abnormality.  CT Chest 10/20/19 [facility], scanned under Media tab): Impression: 1.  Bilateral minimal pleural effusions. 2.  Bibasilar atelectatic change with additional subsegmental atelectatic change posterior segment right upper lobe.  1V CXR 10/18/19 (post-extubation [facility], scanned under Media tab):  Impression: Interval extubation.  A gastric tube is seen extending below the diaphragm and beyond the field-of-view.  Unchanged position of right sided central venous catheter. The cardiac silhouette is mildly  enlarged, unchanged. Mild central pulmonary vascular congestion, slightly progressed compared to prior.  No pleural effusion or pneumothorax. Left basilar opacity favored to be atelectasis though pneumonia should be clinically excluded.   EKG: 10/10/19: NSR   CV: Nuclear stress test 08/06/20: Study Highlights  The left ventricular ejection fraction is normal (55-65%).  Nuclear stress EF: 65%.  The study is normal.  This is a low risk study.    Echo 02/21/20: IMPRESSIONS  1. Left ventricular ejection fraction, by estimation, is 60 to 65%. The  left ventricle has normal function. The left ventricle has no regional  wall motion abnormalities. There is mild left ventricular hypertrophy.  Left ventricular diastolic parameters  are consistent with Grade I diastolic dysfunction (impaired relaxation).  2. Right ventricular systolic function is normal. The right ventricular  size is normal. There is normal pulmonary artery systolic pressure.  3. The mitral valve is normal in structure. Trivial mitral valve  regurgitation. No evidence of mitral stenosis.  4. The aortic valve is normal in structure. Aortic valve regurgitation is  not visualized. No aortic stenosis is present.  5. The inferior vena cava is normal in size with greater than 50%  respiratory variability, suggesting right atrial pressure of 3 mmHg.    Carotid US 02/21/20: Summary:  - Right Carotid: The extracranial vessels were near-normal with only minimal  wall thickening or plaque.  - Left Carotid: The extracranial vessels were near-normal  with only minimal  wall thickening or plaque.  - Vertebrals: Bilateral vertebral arteries demonstrate antegrade flow.  - Subclavians: Normal flow hemodynamics were seen in bilateral subclavian arteries.    As outlined in [facility] 10/25/19 Discharge Summary (scanned under Media tab, [place], 03/14/20): "-LHC (10/23/2019): 70% LAD, 70% first diagonal ostial lesion,  20% left circumflex, 90% right coronary status post DES, 90% right posterior descending ostial lesion status post DES -CTA coronary (10/21/2019): Proximal LAD 25 to 49%, first diagonal over 70%, proximal circumflex 50 to 69%, proximal RCA over 70%, distal RCA over 100%" (COPY OF 10/21/2019 CTA CORONARY IS SCANNED UNDER MEDIA TAB, [facility], PP. 169-171 OF 215. I DID NOT SEE THAT A COPY OF THE CARDIAC CATH WAS SCANNED.)    Past Medical History:  Diagnosis Date  . Anemia   . Anxiety   . Arthritis    right ankle  . Blind    right eye  . CHF (congestive heart failure) ([place])   . COPD (chronic obstructive pulmonary disease) ([place])   . Coronary artery disease   . Depression   . Diabetes mellitus without complication ([place])    type 1  . GERD (gastroesophageal reflux disease)   . History of blood transfusion   . HLD (hyperlipidemia)   . Hypertension   . Hypothyroidism   . Myocardial infarct, old    2020, s/p DES RCA, RPDA 10/23/19  . Pneumonia 10/2019  . Renal disorder    stage 3  . Short-term memory loss    mild  . Sleep apnea    study done 9/27 and 08/13/20 - no results yet 08/21/20  . Stroke [facility])     Past Surgical History:  Procedure Laterality Date  . ABDOMINAL HYSTERECTOMY  2014  . APPLICATION OF WOUND VAC Right 10/11/2019   Procedure: WOUND VAC CHANGE TO RIGHT LEG;  Surgeon: Deanna Needles, MD;  Location: [place];  Service: Orthopedics;  Laterality: Right;  . CORONARY ANGIOPLASTY  10/23/2019   DES RCA, DES RPDA 10/23/19 ([place])  . EYE SURGERY    . HARDWARE REMOVAL Right 10/06/2019   Procedure: HARDWARE REMOVAL;  Surgeon: Deanna Needles, MD;  Location: [place];  Service: Orthopedics;  Laterality: Right;  . I & D EXTREMITY Right 10/06/2019   Procedure: IRRIGATION AND DEBRIDEMENT EXTREMITY;  Surgeon: Deanna Needles, MD;  Location: [place];  Service: Orthopedics;  Laterality: Right;  . I & D EXTREMITY Right 10/09/2019   Procedure: IRRIGATION AND DEBRIDEMENT EXTREMITY and  WOUND VAC CHANGE RIGHT ANKLE;  Surgeon: Deanna Needles, MD;  Location: [place];  Service: Orthopedics;  Laterality: Right;  . ORIF ANKLE FRACTURE Right 07/26/2019   Procedure: OPEN REDUCTION INTERNAL FIXATION (ORIF) ANKLE FRACTURE;  Surgeon: Deanna Needles, MD;  Location: [place];  Service: Orthopedics;  Laterality: Right;  OPEN REDUCTION INTERNAL FIXATION (ORIF) ANKLE FRACTURE   . TOE AMPUTATION      MEDICATIONS: No current facility-administered medications for this encounter.   Marland Kitchen albuterol (VENTOLIN HFA) 108 (90 Base) MCG/ACT inhaler  . amLODipine (NORVASC) 10 MG tablet  . aspirin EC 81 MG tablet  . atorvastatin (LIPITOR) 80 MG tablet  . buPROPion (WELLBUTRIN XL) 300 MG 24 hr tablet  . clopidogrel (PLAVIX) 75 MG tablet  . Glucagon (GVOKE HYPOPEN 2-PACK) 1 MG/0.2ML SOAJ  . HYDROcodone-acetaminophen (NORCO/VICODIN) 5-325 MG tablet  . isosorbide dinitrate (ISORDIL) 20 MG tablet  . levothyroxine (SYNTHROID) 50 MCG tablet  . loratadine (CLARITIN) 10 MG tablet  .  metoCLOPramide (REGLAN) 5 MG tablet  . metoprolol tartrate (LOPRESSOR) 25 MG tablet  . NOVOLOG 100 UNIT/ML injection  . pantoprazole (PROTONIX) 40 MG tablet  . Polyethyl Glycol-Propyl Glycol (SYSTANE) 0.4-0.3 % GEL ophthalmic gel  . pregabalin (LYRICA) 75 MG capsule  . promethazine (PHENERGAN) 25 MG tablet  . ranolazine (RANEXA) 500 MG 12 hr tablet  . sertraline (ZOLOFT) 100 MG tablet  . TRESIBA FLEXTOUCH 200 UNIT/ML FlexTouch Pen  . TRULICITY 1.5 BM/8.4XL SOPN  . Continuous Blood Gluc Receiver ([place]) [place]  . Continuous Blood Gluc Sensor (DEXCOM G6 SENSOR) MISC  . Continuous Blood Gluc Transmit (DEXCOM G6 TRANSMITTER) MISC  . glucose blood test strip  . Insulin Pen Needle (EASY COMFORT PEN Landry) 31G X 8 MM MISC    Myra Gianotti, PA-C Surgical Short Stay/Anesthesiology [facility] Phone [phone] [facility] Phone [phone] 08/22/2020 11:55 AM

## 2020-08-23 ENCOUNTER — Ambulatory Visit: Payer: Self-pay

## 2020-08-23 ENCOUNTER — Ambulatory Visit (HOSPITAL_COMMUNITY)
Admission: RE | Admit: 2020-08-23 | Discharge: 2020-08-23 | Disposition: A | Discharge status: Home / Self Care | Payer: Medicare HMO | Attending: Student | Admitting: Student

## 2020-08-23 ENCOUNTER — Ambulatory Visit (HOSPITAL_COMMUNITY): Payer: Medicare HMO

## 2020-08-23 ENCOUNTER — Ambulatory Visit (HOSPITAL_COMMUNITY): Payer: Medicare HMO | Admitting: Vascular Surgery

## 2020-08-23 ENCOUNTER — Encounter (HOSPITAL_COMMUNITY)
Admission: RE | Disposition: A | Discharge status: Home / Self Care | Payer: Self-pay | Source: Home / Self Care | Attending: Student

## 2020-08-23 ENCOUNTER — Other Ambulatory Visit: Payer: Self-pay

## 2020-08-23 ENCOUNTER — Encounter (HOSPITAL_COMMUNITY): Payer: Self-pay | Admitting: Student

## 2020-08-23 DIAGNOSIS — F329 Major depressive disorder, single episode, unspecified: Secondary | ICD-10-CM | POA: Diagnosis not present

## 2020-08-23 DIAGNOSIS — I2510 Atherosclerotic heart disease of native coronary artery without angina pectoris: Secondary | ICD-10-CM | POA: Diagnosis not present

## 2020-08-23 DIAGNOSIS — G4730 Sleep apnea, unspecified: Secondary | ICD-10-CM | POA: Insufficient documentation

## 2020-08-23 DIAGNOSIS — I252 Old myocardial infarction: Secondary | ICD-10-CM | POA: Diagnosis not present

## 2020-08-23 DIAGNOSIS — Z419 Encounter for procedure for purposes other than remedying health state, unspecified: Secondary | ICD-10-CM

## 2020-08-23 DIAGNOSIS — F419 Anxiety disorder, unspecified: Secondary | ICD-10-CM | POA: Diagnosis not present

## 2020-08-23 DIAGNOSIS — Z79899 Other long term (current) drug therapy: Secondary | ICD-10-CM | POA: Diagnosis not present

## 2020-08-23 DIAGNOSIS — Z794 Long term (current) use of insulin: Secondary | ICD-10-CM | POA: Diagnosis not present

## 2020-08-23 DIAGNOSIS — S82871A Displaced pilon fracture of right tibia, initial encounter for closed fracture: Secondary | ICD-10-CM

## 2020-08-23 DIAGNOSIS — G8918 Other acute postprocedural pain: Secondary | ICD-10-CM | POA: Diagnosis not present

## 2020-08-23 DIAGNOSIS — Z833 Family history of diabetes mellitus: Secondary | ICD-10-CM | POA: Insufficient documentation

## 2020-08-23 DIAGNOSIS — H548 Legal blindness, as defined in USA: Secondary | ICD-10-CM | POA: Insufficient documentation

## 2020-08-23 DIAGNOSIS — J449 Chronic obstructive pulmonary disease, unspecified: Secondary | ICD-10-CM | POA: Diagnosis not present

## 2020-08-23 DIAGNOSIS — E039 Hypothyroidism, unspecified: Secondary | ICD-10-CM | POA: Diagnosis not present

## 2020-08-23 DIAGNOSIS — Z8249 Family history of ischemic heart disease and other diseases of the circulatory system: Secondary | ICD-10-CM | POA: Diagnosis not present

## 2020-08-23 DIAGNOSIS — I509 Heart failure, unspecified: Secondary | ICD-10-CM | POA: Insufficient documentation

## 2020-08-23 DIAGNOSIS — Z8673 Personal history of transient ischemic attack (TIA), and cerebral infarction without residual deficits: Secondary | ICD-10-CM | POA: Diagnosis not present

## 2020-08-23 DIAGNOSIS — I110 Hypertensive heart disease with heart failure: Secondary | ICD-10-CM | POA: Insufficient documentation

## 2020-08-23 DIAGNOSIS — Z955 Presence of coronary angioplasty implant and graft: Secondary | ICD-10-CM | POA: Diagnosis not present

## 2020-08-23 DIAGNOSIS — E109 Type 1 diabetes mellitus without complications: Secondary | ICD-10-CM | POA: Diagnosis not present

## 2020-08-23 DIAGNOSIS — Z9889 Other specified postprocedural states: Secondary | ICD-10-CM | POA: Diagnosis not present

## 2020-08-23 DIAGNOSIS — T148XXA Other injury of unspecified body region, initial encounter: Secondary | ICD-10-CM

## 2020-08-23 DIAGNOSIS — M19071 Primary osteoarthritis, right ankle and foot: Secondary | ICD-10-CM | POA: Insufficient documentation

## 2020-08-23 DIAGNOSIS — Z89511 Acquired absence of right leg below knee: Secondary | ICD-10-CM | POA: Diagnosis not present

## 2020-08-23 DIAGNOSIS — M19171 Post-traumatic osteoarthritis, right ankle and foot: Secondary | ICD-10-CM | POA: Diagnosis not present

## 2020-08-23 HISTORY — DX: Sleep apnea, unspecified: G47.30

## 2020-08-23 HISTORY — DX: Unspecified visual loss: H54.7

## 2020-08-23 HISTORY — DX: Unspecified osteoarthritis, unspecified site: M19.90

## 2020-08-23 HISTORY — DX: Anemia, unspecified: D64.9

## 2020-08-23 HISTORY — DX: Depression, unspecified: F32.A

## 2020-08-23 HISTORY — DX: Other amnesia: R41.3

## 2020-08-23 HISTORY — DX: Personal history of other medical treatment: Z92.89

## 2020-08-23 HISTORY — DX: Anxiety disorder, unspecified: F41.9

## 2020-08-23 HISTORY — DX: Hyperlipidemia, unspecified: E78.5

## 2020-08-23 HISTORY — DX: Gastro-esophageal reflux disease without esophagitis: K21.9

## 2020-08-23 HISTORY — DX: Chronic obstructive pulmonary disease, unspecified: J44.9

## 2020-08-23 HISTORY — PX: ANKLE FUSION: SHX5718

## 2020-08-23 HISTORY — DX: Hypothyroidism, unspecified: E03.9

## 2020-08-23 HISTORY — PX: HARDWARE REMOVAL: SHX979

## 2020-08-23 LAB — BASIC METABOLIC PANEL
Anion gap: 10 (ref 5–15)
BUN: 31 mg/dL — ABNORMAL HIGH (ref 6–20)
CO2: 24 mmol/L (ref 22–32)
Calcium: 8.8 mg/dL — ABNORMAL LOW (ref 8.9–10.3)
Chloride: 100 mmol/L (ref 98–111)
Creatinine, Ser: 2.12 mg/dL — ABNORMAL HIGH (ref 0.44–1.00)
GFR calc non Af Amer: 27 mL/min — ABNORMAL LOW (ref >60)
Glucose, Bld: 201 mg/dL — ABNORMAL HIGH (ref 70–99)
Potassium: 4.7 mmol/L (ref 3.5–5.1)
Sodium: 134 mmol/L — ABNORMAL LOW (ref 135–145)

## 2020-08-23 LAB — CBC
HCT: 34.7 % — ABNORMAL LOW (ref 36.0–46.0)
Hemoglobin: 10.8 g/dL — ABNORMAL LOW (ref 12.0–15.0)
MCH: 27 pg (ref 26.0–34.0)
MCHC: 31.1 g/dL (ref 30.0–36.0)
MCV: 86.8 fL (ref 80.0–100.0)
Platelets: 178 10*3/uL (ref 150–400)
RBC: 4 MIL/uL (ref 3.87–5.11)
RDW: 13.2 % (ref 11.5–15.5)
WBC: 4.3 10*3/uL (ref 4.0–10.5)
nRBC: 0 % (ref 0.0–0.2)

## 2020-08-23 LAB — GLUCOSE, CAPILLARY
Glucose-Capillary: 208 mg/dL — ABNORMAL HIGH (ref 70–99)
Glucose-Capillary: 188 mg/dL — ABNORMAL HIGH (ref 70–99)

## 2020-08-23 LAB — SURGICAL PCR SCREEN
MRSA, PCR: NEGATIVE
Staphylococcus aureus: NEGATIVE

## 2020-08-23 SURGERY — ANKLE FUSION
Anesthesia: General | Site: Ankle | Laterality: Right

## 2020-08-23 MED ORDER — OXYCODONE-ACETAMINOPHEN 5-325 MG PO TABS: 1.0000 | ORAL_TABLET | ORAL | 0 refills | Status: DC | PRN

## 2020-08-23 MED ORDER — BUPIVACAINE HCL (PF) 0.25 % IJ SOLN
INTRAMUSCULAR | Status: DC | PRN
  Administered 2020-08-23 (×2): 15 mL

## 2020-08-23 MED ORDER — BUPIVACAINE LIPOSOME 1.3 % IJ SUSP
INTRAMUSCULAR | Status: DC | PRN
  Administered 2020-08-23: 10 mL via PERINEURAL

## 2020-08-23 MED ORDER — MIDAZOLAM HCL 2 MG/2ML IJ SOLN
INTRAMUSCULAR | Status: AC
  Filled 2020-08-23: qty 2

## 2020-08-23 MED ORDER — ACETAMINOPHEN 500 MG PO TABS
1000.0000 mg | ORAL_TABLET | Freq: Once | ORAL | Status: AC
  Administered 2020-08-23: 1000 mg via ORAL
  Filled 2020-08-23: qty 2

## 2020-08-23 MED ORDER — METHOCARBAMOL 750 MG PO TABS: 750.0000 mg | ORAL_TABLET | Freq: Four times a day (QID) | ORAL | 1 refills | Status: DC | PRN

## 2020-08-23 MED ORDER — CEFAZOLIN SODIUM-DEXTROSE 2-4 GM/100ML-% IV SOLN
2.0000 g | INTRAVENOUS | Status: AC
  Administered 2020-08-23: 2 g via INTRAVENOUS
  Filled 2020-08-23: qty 100

## 2020-08-23 MED ORDER — PROPOFOL 10 MG/ML IV BOLUS
INTRAVENOUS | Status: AC
  Filled 2020-08-23: qty 20

## 2020-08-23 MED ORDER — DEXAMETHASONE SODIUM PHOSPHATE 10 MG/ML IJ SOLN
INTRAMUSCULAR | Status: DC | PRN
  Administered 2020-08-23: 10 mg via INTRAVENOUS

## 2020-08-23 MED ORDER — FENTANYL CITRATE (PF) 100 MCG/2ML IJ SOLN
INTRAMUSCULAR | Status: DC
Start: 2020-08-23 — End: 2020-08-23
  Filled 2020-08-23: qty 2

## 2020-08-23 MED ORDER — CHLORHEXIDINE GLUCONATE 0.12 % MT SOLN
15.0000 mL | Freq: Once | OROMUCOSAL | Status: AC
  Administered 2020-08-23: 15 mL via OROMUCOSAL
  Filled 2020-08-23: qty 15

## 2020-08-23 MED ORDER — DIPHENHYDRAMINE HCL 50 MG/ML IJ SOLN
INTRAMUSCULAR | Status: DC | PRN
  Administered 2020-08-23: 12.5 mg via INTRAVENOUS

## 2020-08-23 MED ORDER — LACTATED RINGERS IV SOLN: INTRAVENOUS | Status: DC | PRN

## 2020-08-23 MED ORDER — PROPOFOL 10 MG/ML IV BOLUS
INTRAVENOUS | Status: DC | PRN
  Administered 2020-08-23: 180 mg via INTRAVENOUS

## 2020-08-23 MED ORDER — 0.9 % SODIUM CHLORIDE (POUR BTL) OPTIME
TOPICAL | Status: DC | PRN
  Administered 2020-08-23: 1000 mL

## 2020-08-23 MED ORDER — ONDANSETRON HCL 4 MG/2ML IJ SOLN
INTRAMUSCULAR | Status: AC
  Filled 2020-08-23: qty 2

## 2020-08-23 MED ORDER — FENTANYL CITRATE (PF) 100 MCG/2ML IJ SOLN
25.0000 ug | INTRAMUSCULAR | Status: DC | PRN
  Administered 2020-08-23: 50 ug via INTRAVENOUS

## 2020-08-23 MED ORDER — PHENYLEPHRINE HCL-NACL 10-0.9 MG/250ML-% IV SOLN
INTRAVENOUS | Status: DC | PRN
  Administered 2020-08-23: 25 ug/min via INTRAVENOUS

## 2020-08-23 MED ORDER — FENTANYL CITRATE (PF) 250 MCG/5ML IJ SOLN
INTRAMUSCULAR | Status: AC
  Filled 2020-08-23: qty 5

## 2020-08-23 MED ORDER — PROMETHAZINE HCL 25 MG/ML IJ SOLN: 6.2500 mg | INTRAMUSCULAR | Status: DC | PRN

## 2020-08-23 MED ORDER — FENTANYL CITRATE (PF) 250 MCG/5ML IJ SOLN
INTRAMUSCULAR | Status: DC | PRN
Start: 2020-08-23 — End: 2020-08-23
  Administered 2020-08-23 (×2): 50 ug via INTRAVENOUS
  Administered 2020-08-23: 25 ug via INTRAVENOUS

## 2020-08-23 MED ORDER — MIDAZOLAM HCL 2 MG/2ML IJ SOLN
INTRAMUSCULAR | Status: DC | PRN
  Administered 2020-08-23 (×2): 1 mg via INTRAVENOUS

## 2020-08-23 MED ORDER — EPHEDRINE SULFATE-NACL 50-0.9 MG/10ML-% IV SOSY
PREFILLED_SYRINGE | INTRAVENOUS | Status: DC | PRN
  Administered 2020-08-23 (×2): 5 mg via INTRAVENOUS

## 2020-08-23 MED ORDER — ORAL CARE MOUTH RINSE: 15.0000 mL | Freq: Once | OROMUCOSAL | Status: AC

## 2020-08-23 MED ORDER — CELECOXIB 200 MG PO CAPS
200.0000 mg | ORAL_CAPSULE | Freq: Once | ORAL | Status: AC
  Administered 2020-08-23: 200 mg via ORAL
  Filled 2020-08-23: qty 1

## 2020-08-23 MED ORDER — LIDOCAINE 2% (20 MG/ML) 5 ML SYRINGE
INTRAMUSCULAR | Status: AC
  Filled 2020-08-23: qty 5

## 2020-08-23 MED ORDER — VANCOMYCIN HCL 1000 MG IV SOLR
INTRAVENOUS | Status: DC | PRN
  Administered 2020-08-23: 1000 mg via TOPICAL

## 2020-08-23 MED ORDER — ONDANSETRON HCL 4 MG/2ML IJ SOLN
INTRAMUSCULAR | Status: DC | PRN
  Administered 2020-08-23: 4 mg via INTRAVENOUS

## 2020-08-23 MED ORDER — CEPHALEXIN 500 MG PO CAPS: 500.0000 mg | ORAL_CAPSULE | Freq: Four times a day (QID) | ORAL | 0 refills | Status: AC

## 2020-08-23 MED ORDER — VANCOMYCIN HCL 1000 MG IV SOLR
INTRAVENOUS | Status: AC
  Filled 2020-08-23: qty 1000

## 2020-08-23 MED ORDER — DEXAMETHASONE SODIUM PHOSPHATE 10 MG/ML IJ SOLN
INTRAMUSCULAR | Status: AC
  Filled 2020-08-23: qty 1

## 2020-08-23 MED ORDER — TOBRAMYCIN SULFATE 1.2 G IJ SOLR
INTRAMUSCULAR | Status: AC
  Filled 2020-08-23: qty 1.2

## 2020-08-23 MED ORDER — TOBRAMYCIN SULFATE 1.2 G IJ SOLR
INTRAMUSCULAR | Status: DC | PRN
  Administered 2020-08-23: 1.2 g via TOPICAL

## 2020-08-23 SURGICAL SUPPLY — 87 items
BANDAGE ESMARK 6X9 LF (GAUZE/BANDAGES/DRESSINGS) IMPLANT
BIT DRILL LONG 3.1X160 (DRILL) ×2 IMPLANT
BIT DRILL SOLID LONG 2.8X160 (DRILL) ×2 IMPLANT
BLADE AVERAGE 25MMX9MM (BLADE) ×1
BLADE AVERAGE 25X9 (BLADE) ×3 IMPLANT
BNDG COHESIVE 6X5 TAN STRL LF (GAUZE/BANDAGES/DRESSINGS) ×4 IMPLANT
BNDG ELASTIC 4X5.8 VLCR STR LF (GAUZE/BANDAGES/DRESSINGS) ×4 IMPLANT
BNDG ELASTIC 6X10 VLCR STRL LF (GAUZE/BANDAGES/DRESSINGS) ×4 IMPLANT
BNDG ELASTIC 6X5.8 VLCR STR LF (GAUZE/BANDAGES/DRESSINGS) ×4 IMPLANT
BNDG ESMARK 6X9 LF (GAUZE/BANDAGES/DRESSINGS)
BNDG GAUZE ELAST 4 BULKY (GAUZE/BANDAGES/DRESSINGS) ×4 IMPLANT
BRUSH SCRUB EZ PLAIN DRY (MISCELLANEOUS) ×8 IMPLANT
CHLORAPREP W/TINT 26 (MISCELLANEOUS) ×8 IMPLANT
CLOSURE STERI-STRIP 1/2X4 (GAUZE/BANDAGES/DRESSINGS) ×1
CLOSURE WOUND 1/2 X4 (GAUZE/BANDAGES/DRESSINGS)
CLSR STERI-STRIP ANTIMIC 1/2X4 (GAUZE/BANDAGES/DRESSINGS) ×3 IMPLANT
COVER SURGICAL LIGHT HANDLE (MISCELLANEOUS) ×8 IMPLANT
COVER WAND RF STERILE (DRAPES) IMPLANT
CUFF TOURN SGL QUICK 18X4 (TOURNIQUET CUFF) IMPLANT
CUFF TOURN SGL QUICK 24 (TOURNIQUET CUFF)
CUFF TOURN SGL QUICK 34 (TOURNIQUET CUFF) ×2
CUFF TRNQT CYL 24X4X16.5-23 (TOURNIQUET CUFF) IMPLANT
CUFF TRNQT CYL 34X4.125X (TOURNIQUET CUFF) ×2 IMPLANT
DRAPE C-ARM 42X72 X-RAY (DRAPES) IMPLANT
DRAPE C-ARMOR (DRAPES) ×4 IMPLANT
DRAPE U-SHAPE 47X51 STRL (DRAPES) ×4 IMPLANT
DRILL LONG 3.1X160 (DRILL) ×4
DRILL SOLID LONG 2.8X160 (DRILL) ×4
DRSG ADAPTIC 3X8 NADH LF (GAUZE/BANDAGES/DRESSINGS) ×4 IMPLANT
ELECT REM PT RETURN 9FT ADLT (ELECTROSURGICAL) ×4
ELECTRODE REM PT RTRN 9FT ADLT (ELECTROSURGICAL) ×2 IMPLANT
GAUZE SPONGE 4X4 12PLY STRL (GAUZE/BANDAGES/DRESSINGS) ×4 IMPLANT
GLOVE BIO SURGEON STRL SZ 6.5 (GLOVE) ×9 IMPLANT
GLOVE BIO SURGEON STRL SZ7.5 (GLOVE) ×16 IMPLANT
GLOVE BIO SURGEONS STRL SZ 6.5 (GLOVE) ×3
GLOVE BIOGEL PI IND STRL 6.5 (GLOVE) ×2 IMPLANT
GLOVE BIOGEL PI IND STRL 7.5 (GLOVE) ×2 IMPLANT
GLOVE BIOGEL PI INDICATOR 6.5 (GLOVE) ×2
GLOVE BIOGEL PI INDICATOR 7.5 (GLOVE) ×2
GOWN STRL REUS W/ TWL LRG LVL3 (GOWN DISPOSABLE) ×4 IMPLANT
GOWN STRL REUS W/TWL LRG LVL3 (GOWN DISPOSABLE) ×4
K-WIRE SURGICAL 1.6X102 (WIRE) ×4 IMPLANT
KIT BASIN OR (CUSTOM PROCEDURE TRAY) ×4 IMPLANT
KIT TURNOVER KIT B (KITS) ×4 IMPLANT
MANIFOLD NEPTUNE II (INSTRUMENTS) IMPLANT
NEEDLE 22X1 1/2 (OR ONLY) (NEEDLE) IMPLANT
NS IRRIG 1000ML POUR BTL (IV SOLUTION) ×4 IMPLANT
PACK ORTHO EXTREMITY (CUSTOM PROCEDURE TRAY) ×4 IMPLANT
PAD ARMBOARD 7.5X6 YLW CONV (MISCELLANEOUS) ×8 IMPLANT
PADDING CAST ABS 6INX4YD NS (CAST SUPPLIES) ×2
PADDING CAST ABS COTTON 6X4 NS (CAST SUPPLIES) ×2 IMPLANT
PADDING CAST COTTON 6X4 STRL (CAST SUPPLIES) ×12 IMPLANT
PLATE LATERAL TT STD RT (Plate) ×4 IMPLANT
SCREW LOCK PLATE R3 4.2X36 (Screw) ×4 IMPLANT
SCREW LOCK PLATE R3 4.2X38 (Screw) ×4 IMPLANT
SCREW LOCK PLATE R3 4.2X40 (Screw) ×4 IMPLANT
SCREW NLOCK PLATE SB 4.5X22 (Screw) ×4 IMPLANT
SCREW NLOCK PLATE SB 4.5X26 (Screw) ×4 IMPLANT
SCREW NLOCK PLATE SB 4.5X32 (Screw) ×4 IMPLANT
SCREW NONLOCK 4.5X28 (Screw) ×4 IMPLANT
SCREW NONLOCK 4.5X36 (Screw) ×4 IMPLANT
SCREW NONLOCK PLATE R3 4.2X36 (Screw) ×4 IMPLANT
SCREW NONLOCK PLATE R3 4.2X38 (Screw) ×4 IMPLANT
SPONGE LAP 18X18 RF (DISPOSABLE) ×4 IMPLANT
STAPLER VISISTAT 35W (STAPLE) IMPLANT
STOCKINETTE IMPERVIOUS LG (DRAPES) ×4 IMPLANT
STRIP CLOSURE SKIN 1/2X4 (GAUZE/BANDAGES/DRESSINGS) IMPLANT
SUCTION FRAZIER HANDLE 10FR (MISCELLANEOUS)
SUCTION TUBE FRAZIER 10FR DISP (MISCELLANEOUS) IMPLANT
SUT ETHILON 3 0 PS 1 (SUTURE) ×8 IMPLANT
SUT MNCRL AB 3-0 PS2 18 (SUTURE) IMPLANT
SUT MON AB 2-0 CT1 36 (SUTURE) ×12 IMPLANT
SUT PDS AB 2-0 CT1 27 (SUTURE) ×8 IMPLANT
SUT VIC AB 0 CT1 27 (SUTURE) ×4
SUT VIC AB 0 CT1 27XBRD ANBCTR (SUTURE) ×4 IMPLANT
SUT VIC AB 2-0 CT1 27 (SUTURE) ×4
SUT VIC AB 2-0 CT1 TAPERPNT 27 (SUTURE) ×4 IMPLANT
SYR BULB IRRIG 60ML STRL (SYRINGE) ×4 IMPLANT
SYR CONTROL 10ML LL (SYRINGE) IMPLANT
TOWEL GREEN STERILE (TOWEL DISPOSABLE) ×8 IMPLANT
TOWEL GREEN STERILE FF (TOWEL DISPOSABLE) ×8 IMPLANT
TUBE CONNECTING 12'X1/4 (SUCTIONS) ×1
TUBE CONNECTING 12X1/4 (SUCTIONS) ×3 IMPLANT
UNDERPAD 30X36 HEAVY ABSORB (UNDERPADS AND DIAPERS) ×4 IMPLANT
WATER STERILE IRR 1000ML POUR (IV SOLUTION) IMPLANT
WIRE OLIVE HALF 1.6X80 (WIRE) ×4 IMPLANT
YANKAUER SUCT BULB TIP NO VENT (SUCTIONS) ×4 IMPLANT

## 2020-08-23 NOTE — Op Note (Signed)
Orthopaedic Surgery Operative Note (CSN: [id number] ) Date of Surgery: 08/23/2020  Admit Date: 08/23/2020   Diagnoses: Pre-Op Diagnoses: Right ankle end-stage arthritis   Post-Op Diagnosis: Same  Procedures: 1. CPT 27870-Fusion of right tibiotalar joint 2. CPT 20680-Removal of hardware right ankle  Surgeons : Primary: Jazyiah Yiu, Thomasene Lot, MD  Assistant: Izola Price, RNFA  Location: OR 5  Anesthesia:General with regional anesthesia  Antibiotics: Ancef 2g preop with 1 gm vancomycin powder and 1.2 gm tobramycin powder   Tourniquet time:* Missing tourniquet times found for documented tourniquets in log: [id number] *  Estimated Blood HYIF:02 mL  Complications:None   Specimens:None   Implants: Implant Name Type Inv. Item Serial No. Manufacturer Lot No. LRB No. Used Action  PLATE LATERAL TT STD RT - [id number] Plate PLATE LATERAL TT STD RT  [employer]  Right 1 Implanted  SCREW NONLOCK PLATE R3 7.6H20 - [id number] Screw SCREW NONLOCK PLATE R3 6.6Q94  [employer]  Right 1 Implanted  SCREW NONLOCK PLATE R3 7.6L46 - [id number] Screw SCREW NONLOCK PLATE R3 1.2X51  [employer]  Right 1 Implanted  SCREW LOCK PLATE R3 7.0Y17 - [id number] Screw SCREW LOCK PLATE R3 1.6B84  [employer]  Right 1 Implanted  SCREW LOCK PLATE R3 6.6Z99 - [id number] Screw SCREW LOCK PLATE R3 9.0Z00  [employer]  Right 1 Implanted  SCREW LOCK PLATE R3 9.2Z30 - [id number] Screw SCREW LOCK PLATE R3 5.4T62  [employer]  Right 1 Implanted  SCREW NLOCK PLATE SB 5.6L89 - [id number] Screw SCREW NLOCK PLATE SB 1.1X72  [employer]  Right 1 Implanted  SCREW NLOCK PLATE SB 6.2M35 - [id number] Screw SCREW NLOCK PLATE SB 5.3M46  [employer]  Right 1 Implanted  SCREW NONLOCK 4.5X28 - [id number] Screw SCREW NONLOCK 4.5X28  [employer]  Right 1 Implanted  SCREW NLOCK PLATE SB 2.5O03 - [id number] Screw SCREW NLOCK PLATE SB 9.4H03  [employer]  Right 1 Explanted  SCREW NONLOCK 4.5X36 - [id number] Screw SCREW NONLOCK  4.5X36  [employer]  Right 1 Implanted     Indications for Surgery: 49 year old female who sustained a right trimalleolar ankle fracture dislocation.  She underwent open reduction internal fixation with myself in September 2020.  She subsequently developed a postoperative infection.  She had multiple debridements and eventually cleared her infection after removal of hardware.  She had subsequently gone on to end-stage arthritis.  She continued to have significant pain with ambulation.  I went over risks and benefits of proceeding with ankle fusion.  Risks included but not limited to bleeding, infection, malunion, nonunion, hardware failure, hardware irritation, wound breakdown, nerve and blood vessel injury, need for subtalar joint fusion, need for a prolonged nonweightbearing.  The patient agreed to proceed with surgery and consent was obtained.  Operative Findings: 1.  Removal of medial malleolus and anterior to posterior posterior malleolus screws from previous fixation 2.  Resection of lateral malleolus with tibiotalar joint fusion using [employer] 28 lateral tibiotalar fusion plate  Procedure: The patient was identified in the preoperative holding area. Consent was confirmed with the patient and their family and all questions were answered. The operative extremity was marked after confirmation with the patient. she was then brought back to the operating room by our anesthesia colleagues.  She was placed under general anesthetic and carefully transferred over to a radiolucent flat top table.  A bump was placed under her operative hip.  A nonsterile tourniquet was placed to  her upper thigh.  The right lower extremity was prepped and draped in usual sterile fashion.  A timeout was performed to verify the patient, the procedure, and the extremity.  Preoperative antibiotics were dosed.  Fluoroscopic imaging was obtained to show the previous hardware in the end-stage arthritis.  The tourniquet was  inflated to 300 mmHg.  Total tourniquet time as noted above.  Percutaneous incisions were made along the medial malleolus to remove the medial malleolus screws.  These were removed without difficulty.  I then made an anterior incision to remove the anterior to posterior cannulated screws.  These were able to be removed without significant difficulty.  I then opened up the lateral incision.  I carried it down through skin and subcutaneous tissue.  I exposed the lateral fibula and used a sharp dissection to mobilize the skin flaps anteriorly and laterally.  I mobilized and protected the peroneal tendons throughout the case.   I measured the length of the plate and proceeded to osteotomized the fibula using a oscillating saw.  I then repeated the process in segmental fashion down the fibula and remove the soft tissue attachments.  I was able to remove the entirety of the fibula.  I then debrided the osteophytes surrounding the anterior portion of the tibiotalar joint.  I did prepare the joint surface using osteotomes.  I did create a wedge anteriorly to allow for dorsiflexion of her ankle.  I confirmed that she was in neutral dorsiflexion prior to placement of her hardware.  Once the joint was prepared I then provisionally held the Paragon 28 lateral tibial talar fusion plate in place with a K wire.  I confirmed positioning with fluoroscopy and then placed a nonlocking screw in the distal segment.  I placed another nonlocking screw in the distal segment and return to the proximal segment and drilled and placed a screw in compression fashion to compress the tibiotalar fusion site.  My assistant provided ample dorsiflexion to the ankle to maintain neutrality.  I confirmed positioning and placement of the screws and then proceeded to place 3 more nonlocking screws into the tibial shaft.  I then returned to the distal segment and placed locking screws into the talus.  Excellent fixation was obtained.  I then morselized  the fibula and placed bone graft anteriorly where I had created a wedge to get more dorsiflexion.  I packed this cancellous bone until there was no further space visible on fluoroscopy.  Final fluoroscopic imaging was obtained.  The incision was copiously irrigated.  A gram of vancomycin powder and 1.2 g tobramycin powder were placed into the incision.  Layered closure of 2-0 Monocryl and 3-0 nylon was used to close the skin.  Sterile dressing consisting of Adaptic, 4 x 4's, sterile cast padding and a well-padded short leg splint was applied.  The patient was then awoken from anesthesia and taken to the PACU in stable condition.  Post Op Plan/Instructions: Patient will be nonweightbearing to the right lower extremity.  She will be discharged home from the PACU.  She will return in 2 weeks for suture removal and x-rays.  She will be placed on her dual platelet therapy for DVT prophylaxis.  I was present and performed the entire surgery.  Katha Hamming, MD [facility]

## 2020-08-23 NOTE — H&P (Signed)
Orthopaedic Trauma Service (OTS) Consult   Patient ID: Deanna Landry MRN: [id number] DOB/AGE: May 10, 1971 49 y.o.  Reason for Surgery: Right ankle fusion  HPI: Deanna Landry is an 49 y.o. female who sustained a right trimalleolar ankle fracture dislocation.  She underwent open reduction internal fixation with myself in September 2020.  She subsequently developed a postoperative infection.  She had multiple debridements and eventually cleared her infection after removal of hardware.  She had subsequently gone on to end-stage arthritis.  She continued to have significant pain with ambulation.    I recommended proceeding with removal of hardware and fusion.  She presents for surgery.    Past Medical History:  Diagnosis Date  . Anemia   . Anxiety   . Arthritis    right ankle  . Blind    right eye  . CHF (congestive heart failure) ([place])   . COPD (chronic obstructive pulmonary disease) ([place])   . Coronary artery disease   . Depression   . Diabetes mellitus without complication ([place])    type 1  . GERD (gastroesophageal reflux disease)   . History of blood transfusion   . HLD (hyperlipidemia)   . Hypertension   . Hypothyroidism   . Myocardial infarct, old    2020, s/p DES RCA, RPDA 10/23/19  . Pneumonia 10/2019  . Renal disorder    stage 3  . Short-term memory loss    mild  . Sleep apnea    study done 9/27 and 08/13/20 - no results yet 08/21/20  . Stroke [facility])     Past Surgical History:  Procedure Laterality Date  . ABDOMINAL HYSTERECTOMY  2014  . APPLICATION OF WOUND VAC Right 10/11/2019   Procedure: WOUND VAC CHANGE TO RIGHT LEG;  Surgeon: Shona Needles, MD;  Location: [place];  Service: Orthopedics;  Laterality: Right;  . CORONARY ANGIOPLASTY  10/23/2019   DES RCA, DES RPDA 10/23/19 ([place])  . EYE SURGERY    . HARDWARE REMOVAL Right 10/06/2019   Procedure: HARDWARE REMOVAL;  Surgeon: Shona Needles, MD;  Location: [place];  Service: Orthopedics;  Laterality: Right;  . I & D EXTREMITY  Right 10/06/2019   Procedure: IRRIGATION AND DEBRIDEMENT EXTREMITY;  Surgeon: Shona Needles, MD;  Location: [place];  Service: Orthopedics;  Laterality: Right;  . I & D EXTREMITY Right 10/09/2019   Procedure: IRRIGATION AND DEBRIDEMENT EXTREMITY and WOUND VAC CHANGE RIGHT ANKLE;  Surgeon: Shona Needles, MD;  Location: [place];  Service: Orthopedics;  Laterality: Right;  . ORIF ANKLE FRACTURE Right 07/26/2019   Procedure: OPEN REDUCTION INTERNAL FIXATION (ORIF) ANKLE FRACTURE;  Surgeon: Shona Needles, MD;  Location: [place];  Service: Orthopedics;  Laterality: Right;  OPEN REDUCTION INTERNAL FIXATION (ORIF) ANKLE FRACTURE   . TOE AMPUTATION      Family History  Problem Relation Age of Onset  . Diabetes Mellitus II Mother   . Stroke Mother   . Heart attack Mother   . Stomach cancer Father   . Congestive Heart Failure Father   . Diabetes Sister   . Congestive Heart Failure Maternal Grandfather     Social History:  reports that she has never smoked. She has never used smokeless tobacco. She reports previous alcohol use. She reports previous drug use.  Allergies: No Known Allergies  Medications: I have reviewed the patient's current medications.  ROS: Constitutional: No fever or chills Vision: No changes in vision ENT: No difficulty swallowing CV: No chest pain Pulm: No  SOB or wheezing GI: No nausea or vomiting GU: No urgency or inability to hold urine Skin: No poor wound healing Neurologic: No numbness or tingling Psychiatric: No depression or anxiety Heme: No bruising Allergic: No reaction to medications or food   Exam: Blood pressure 134/79, pulse 71, temperature 98 F (36.7 C), resp. rate 12, height 5\' 2"  (1.575 m), weight 79.4 kg, SpO2 99 %. General: No acute distress Orientation: Awake alert and oriented x3 Mood and Affect: Cooperative and pleasant Gait: Antalgic gait Coordination and balance: Within normal limits  Right lower extremity: Healed incisions.  Minimal to  no range of motion of the ankle painful range of motion of the ankle.  Neurovascularly intact.  Medical Decision Making: Data: Imaging: X-rays show end-stage arthritis with significant sclerosis.  Labs:  Results for orders placed or performed during the hospital encounter of 08/23/20 (from the past 24 hour(s))  Glucose, capillary     Status: Abnormal   Collection Time: 08/23/20  5:56 AM  Result Value Ref Range   Glucose-Capillary 208 (H) 70 - 99 mg/dL   Comment 1 Notify RN   Basic metabolic panel per protocol     Status: Abnormal   Collection Time: 08/23/20  6:49 AM  Result Value Ref Range   Sodium 134 (L) 135 - 145 mmol/L   Potassium 4.7 3.5 - 5.1 mmol/L   Chloride 100 98 - 111 mmol/L   CO2 24 22 - 32 mmol/L   Glucose, Bld 201 (H) 70 - 99 mg/dL   BUN 31 (H) 6 - 20 mg/dL   Creatinine, Ser 2.12 (H) 0.44 - 1.00 mg/dL   Calcium 8.8 (L) 8.9 - 10.3 mg/dL   GFR calc non Af Amer 27 (L) >60 mL/min   Anion gap 10 5 - 15  CBC per protocol     Status: Abnormal   Collection Time: 08/23/20  6:49 AM  Result Value Ref Range   WBC 4.3 4.0 - 10.5 K/uL   RBC 4.00 3.87 - 5.11 MIL/uL   Hemoglobin 10.8 (L) 12.0 - 15.0 g/dL   HCT 34.7 (L) 36 - 46 %   MCV 86.8 80.0 - 100.0 fL   MCH 27.0 26.0 - 34.0 pg   MCHC 31.1 30.0 - 36.0 g/dL   RDW 13.2 11.5 - 15.5 %   Platelets 178 150 - 400 K/uL   nRBC 0.0 0.0 - 0.2 %  Surgical pcr screen     Status: None   Collection Time: 08/23/20  6:55 AM   Specimen: Nasal Mucosa; Nasal Swab  Result Value Ref Range   MRSA, PCR NEGATIVE NEGATIVE   Staphylococcus aureus NEGATIVE NEGATIVE  Glucose, capillary     Status: Abnormal   Collection Time: 08/23/20 10:16 AM  Result Value Ref Range   Glucose-Capillary 188 (H) 70 - 99 mg/dL   Comment 1 Notify RN    Comment 2 Document in Chart     Imaging or Labs ordered: None  Medical history and chart was reviewed and case discussed with medical provider.  Assessment/Plan: 49 year old female with right end-stage ankle  arthritis presents for ankle fusion  Plan to proceed with ankle fusion.  Risks and benefits were discussed. Risks included but not limited to bleeding, infection, malunion, nonunion, hardware failure, hardware irritation, wound breakdown, nerve and blood vessel injury, need for subtalar joint fusion, need for a prolonged nonweightbearing.  The patient agreed to proceed with surgery and consent was obtained.  Shona Needles, MD [facility] [phone] (office)  [email or web address]

## 2020-08-23 NOTE — Transfer of Care (Signed)
Immediate Anesthesia Transfer of Care Note  Patient: Deanna Landry  Procedure(s) Performed: ANKLE FUSION (Right Ankle) HARDWARE REMOVAL (Right )  Patient Location: PACU  Anesthesia Type:General  Level of Consciousness: drowsy and patient cooperative  Airway & Oxygen Therapy: Patient Spontanous Breathing and Patient connected to face mask oxygen  Post-op Assessment: Report given to RN and Post -op Vital signs reviewed and stable  Post vital signs: Reviewed and stable  Last Vitals:  Vitals Value Taken Time  BP 160/73 08/23/20 1014  Temp    Pulse 72 08/23/20 1015  Resp 17 08/23/20 1015  SpO2 100 % 08/23/20 1015  Vitals shown include unvalidated device data.  Last Pain:  Vitals:   08/23/20 0631  TempSrc:   PainSc: 6       Patients Stated Pain Goal: 2 (25/75/05 1833)  Complications: No complications documented.

## 2020-08-23 NOTE — Anesthesia Postprocedure Evaluation (Signed)
Anesthesia Post Note  Patient: Deanna Landry  Procedure(s) Performed: ANKLE FUSION (Right Ankle) HARDWARE REMOVAL (Right )     Patient location during evaluation: PACU Anesthesia Type: General Level of consciousness: sedated Pain management: pain level controlled Vital Signs Assessment: post-procedure vital signs reviewed and stable Respiratory status: spontaneous breathing and respiratory function stable Cardiovascular status: stable Postop Assessment: no apparent nausea or vomiting Anesthetic complications: no   No complications documented.  Last Vitals:  Vitals:   08/23/20 1044 08/23/20 1059  BP: 126/62 134/79  Pulse: 66 71  Resp: 11 12  Temp:  36.7 C  SpO2: 96% 99%    Last Pain:  Vitals:   08/23/20 1044  TempSrc:   PainSc: Asleep                 Shequilla Goodgame DANIEL

## 2020-08-23 NOTE — Discharge Instructions (Addendum)
Orthopaedic Trauma Service Discharge Instructions   General Discharge Instructions  Orthopaedic Injuries:  Right ankle fusion  WEIGHT BEARING STATUS: Nonweightbearing to the right leg.  Do not put any pressure on your leg.  RANGE OF MOTION/ACTIVITY: You must keep the splint clean, dry, and intact.  Otherwise you can do activities as tolerated  Wound Care: Keep the splint clean dry and intact until you return to see Dr. Doreatha Martin in the office  DVT/PE prophylaxis: Restart Plavix and aspirin postoperative day 1 (August 24, 2020)  Diet: as you were eating previously.  Can use over the counter stool softeners and bowel preparations, such as Miralax, to help with bowel movements.  Narcotics can be constipating.  Be sure to drink plenty of fluids  PAIN MEDICATION USE AND EXPECTATIONS  You have likely been given narcotic medications to help control your pain.  After a traumatic event that results in an fracture (broken bone) with or without surgery, it is ok to use narcotic pain medications to help control one's pain.  We understand that everyone responds to pain differently and each individual patient will be evaluated on a regular basis for the continued need for narcotic medications. Ideally, narcotic medication use should last no more than 6-8 weeks (coinciding with fracture healing).   As a patient it is your responsibility as well to monitor narcotic medication use and report the amount and frequency you use these medications when you come to your office visit.   We would also advise that if you are using narcotic medications, you should take a dose prior to therapy to maximize you participation.  IF YOU ARE ON NARCOTIC MEDICATIONS IT IS NOT PERMISSIBLE TO OPERATE A MOTOR VEHICLE (MOTORCYCLE/CAR/TRUCK/MOPED) OR HEAVY MACHINERY DO NOT MIX NARCOTICS WITH OTHER CNS (CENTRAL NERVOUS SYSTEM) DEPRESSANTS SUCH AS ALCOHOL   STOP SMOKING OR USING NICOTINE PRODUCTS!!!!  As discussed nicotine  severely impairs your body's ability to heal surgical and traumatic wounds but also impairs bone healing.  Wounds and bone heal by forming microscopic blood vessels (angiogenesis) and nicotine is a vasoconstrictor (essentially, shrinks blood vessels).  Therefore, if vasoconstriction occurs to these microscopic blood vessels they essentially disappear and are unable to deliver necessary nutrients to the healing tissue.  This is one modifiable factor that you can do to dramatically increase your chances of healing your injury.    (This means no smoking, no nicotine gum, patches, etc)  DO NOT USE NONSTEROIDAL ANTI-INFLAMMATORY DRUGS (NSAID'S)  Using products such as Advil (ibuprofen), Aleve (naproxen), Motrin (ibuprofen) for additional pain control during fracture healing can delay and/or prevent the healing response.  If you would like to take over the counter (OTC) medication, Tylenol (acetaminophen) is ok.  However, some narcotic medications that are given for pain control contain acetaminophen as well. Therefore, you should not exceed more than 4000 mg of tylenol in a day if you do not have liver disease.  Also note that there are may OTC medicines, such as cold medicines and allergy medicines that my contain tylenol as well.  If you have any questions about medications and/or interactions please ask your doctor/PA or your pharmacist.      ICE AND ELEVATE INJURED/OPERATIVE EXTREMITY  Using ice and elevating the injured extremity above your heart can help with swelling and pain control.  Icing in a pulsatile fashion, such as 20 minutes on and 20 minutes off, can be followed.    Do not place ice directly on skin. Make sure there is a  barrier between to skin and the ice pack.    Using frozen items such as frozen peas works well as the conform nicely to the are that needs to be iced.  USE AN ACE WRAP OR TED HOSE FOR SWELLING CONTROL  In addition to icing and elevation, Ace wraps or TED hose are used to  help limit and resolve swelling.  It is recommended to use Ace wraps or TED hose until you are informed to stop.    When using Ace Wraps start the wrapping distally (farthest away from the body) and wrap proximally (closer to the body)   Example: If you had surgery on your leg or thing and you do not have a splint on, start the ace wrap at the toes and work your way up to the thigh        If you had surgery on your upper extremity and do not have a splint on, start the ace wrap at your fingers and work your way up to the upper arm  IF YOU ARE IN A SPLINT OR CAST DO NOT [place]   If your splint gets wet for any reason please contact the office immediately. You may shower in your splint or cast as long as you keep it dry.  This can be done by wrapping in a cast cover or garbage back (or similar)  Do Not stick any thing down your splint or cast such as pencils, money, or hangers to try and scratch yourself with.  If you feel itchy take benadryl as prescribed on the bottle for itching  IF YOU ARE IN A CAM BOOT (BLACK BOOT)  You may remove boot periodically. Perform daily dressing changes as noted below.  Wash the liner of the boot regularly and wear a sock when wearing the boot. It is recommended that you sleep in the boot until told otherwise    Call office for the following:  Temperature greater than 101F  Persistent nausea and vomiting  Severe uncontrolled pain  Redness, tenderness, or signs of infection (pain, swelling, redness, odor or green/yellow discharge around the site)  Difficulty breathing, headache or visual disturbances  Hives  Persistent dizziness or light-headedness  Extreme fatigue  Any other questions or concerns you may have after discharge  In an emergency, call 911 or go to an Emergency Department at a nearby hospital  HELPFUL INFORMATION  ? If you had a block, it will wear off between 8-24 hrs postop typically.  This is period when your pain  may go from nearly zero to the pain you would have had postop without the block.  This is an abrupt transition but nothing dangerous is happening.  You may take an extra dose of narcotic when this happens.  ? You should wean off your narcotic medicines as soon as you are able.  Most patients will be off or using minimal narcotics before their first postop appointment.   ? We suggest you use the pain medication the first night prior to going to bed, in order to ease any pain when the anesthesia wears off. You should avoid taking pain medications on an empty stomach as it will make you nauseous.  ? Do not drink alcoholic beverages or take illicit drugs when taking pain medications.  ? In most states it is against the law to drive while you are in a splint or sling.  And certainly against the law to drive while taking narcotics.  ? You  may return to work/school in the next couple of days when you feel up to it.   ? Pain medication may make you constipated.  Below are a few solutions to try in this order: - Decrease the amount of pain medication if you aren't having pain. - Drink lots of decaffeinated fluids. - Drink prune juice and/or each dried prunes  o If the first 3 don't work start with additional solutions - Take Colace - an over-the-counter stool softener - Take Senokot - an over-the-counter laxative - Take Miralax - a stronger over-the-counter laxative     CALL THE OFFICE WITH ANY QUESTIONS OR CONCERNS: [phone]   VISIT OUR WEBSITE FOR ADDITIONAL INFORMATION: [email or web address]

## 2020-08-23 NOTE — Anesthesia Procedure Notes (Addendum)
Anesthesia Regional Block: Popliteal block   Pre-Anesthetic Checklist: ,, timeout performed, Correct Patient, Correct Site, Correct Laterality, Correct Procedure, Correct Position, site marked, Risks and benefits discussed,  Surgical consent,  Pre-op evaluation,  At surgeon's request and post-op pain management  Laterality: Right  Prep: chloraprep       Needles:  Injection technique: Single-shot  Needle Type: Echogenic Stimulator Needle          Additional Needles:   Narrative:  Start time: 08/23/2020 7:01 AM End time: 08/23/2020 7:11 AM Injection made incrementally with aspirations every 5 mL.  Performed by: Personally  Anesthesiologist: Duane Boston, MD  Additional Notes: A functioning IV was confirmed and monitors were applied.  Sterile prep and drape, hand hygiene and sterile gloves were used.  Negative aspiration and test dose prior to incremental administration of local anesthetic. The patient tolerated the procedure well.Ultrasound  guidance: relevant anatomy identified, needle position confirmed, local anesthetic spread visualized around nerve(s), vascular puncture avoided.  Image printed for medical record. ACB supplementation.

## 2020-08-23 NOTE — Progress Notes (Signed)
Orthopedic Tech Progress Note Patient Details:  Deanna Landry 04-Dec-1970 [id number] PACU RN called requesting a PAIR OF CRUTCHES for patient. Patient had a little differculty with crutches Ortho Devices Type of Ortho Device: Crutches Ortho Device/Splint Interventions: Adjustment   Post Interventions Patient Tolerated: Well, Difficulty with ambulation Instructions Provided: Care of device   [name] 08/23/2020, 11:54 AM

## 2020-08-23 NOTE — Anesthesia Procedure Notes (Signed)
Procedure Name: LMA Insertion Date/Time: 08/23/2020 7:38 AM Performed by: Bryson Corona, CRNA Pre-anesthesia Checklist: Patient identified, Emergency Drugs available, Suction available and Patient being monitored Patient Re-evaluated:Patient Re-evaluated prior to induction Oxygen Delivery Method: Circle System Utilized Preoxygenation: Pre-oxygenation with 100% oxygen Induction Type: IV induction Ventilation: Mask ventilation without difficulty LMA: LMA with gastric port inserted LMA Size: 4.0 Number of attempts: 1 Placement Confirmation: positive ETCO2 Tube secured with: Tape Dental Injury: Teeth and Oropharynx as per pre-operative assessment

## 2020-08-26 ENCOUNTER — Encounter (HOSPITAL_COMMUNITY): Payer: Self-pay | Admitting: Student

## 2020-09-05 DIAGNOSIS — I509 Heart failure, unspecified: Secondary | ICD-10-CM | POA: Diagnosis not present

## 2020-09-05 DIAGNOSIS — G5603 Carpal tunnel syndrome, bilateral upper limbs: Secondary | ICD-10-CM | POA: Diagnosis not present

## 2020-09-05 DIAGNOSIS — R110 Nausea: Secondary | ICD-10-CM | POA: Diagnosis not present

## 2020-09-05 DIAGNOSIS — G4733 Obstructive sleep apnea (adult) (pediatric): Secondary | ICD-10-CM | POA: Diagnosis not present

## 2020-09-05 DIAGNOSIS — J449 Chronic obstructive pulmonary disease, unspecified: Secondary | ICD-10-CM | POA: Diagnosis not present

## 2020-09-05 DIAGNOSIS — M79671 Pain in right foot: Secondary | ICD-10-CM | POA: Diagnosis not present

## 2020-09-05 DIAGNOSIS — I679 Cerebrovascular disease, unspecified: Secondary | ICD-10-CM | POA: Diagnosis not present

## 2020-09-05 DIAGNOSIS — M159 Polyosteoarthritis, unspecified: Secondary | ICD-10-CM | POA: Diagnosis not present

## 2020-09-05 DIAGNOSIS — N184 Chronic kidney disease, stage 4 (severe): Secondary | ICD-10-CM | POA: Diagnosis not present

## 2020-09-10 DIAGNOSIS — M19071 Primary osteoarthritis, right ankle and foot: Secondary | ICD-10-CM | POA: Diagnosis not present

## 2020-09-10 DIAGNOSIS — S82871D Displaced pilon fracture of right tibia, subsequent encounter for closed fracture with routine healing: Secondary | ICD-10-CM | POA: Diagnosis not present

## 2020-09-14 DIAGNOSIS — I5042 Chronic combined systolic (congestive) and diastolic (congestive) heart failure: Secondary | ICD-10-CM | POA: Diagnosis not present

## 2020-09-20 DIAGNOSIS — M159 Polyosteoarthritis, unspecified: Secondary | ICD-10-CM | POA: Diagnosis not present

## 2020-09-20 DIAGNOSIS — J449 Chronic obstructive pulmonary disease, unspecified: Secondary | ICD-10-CM | POA: Diagnosis not present

## 2020-09-20 DIAGNOSIS — R110 Nausea: Secondary | ICD-10-CM | POA: Diagnosis not present

## 2020-09-20 DIAGNOSIS — N184 Chronic kidney disease, stage 4 (severe): Secondary | ICD-10-CM | POA: Diagnosis not present

## 2020-09-20 DIAGNOSIS — I679 Cerebrovascular disease, unspecified: Secondary | ICD-10-CM | POA: Diagnosis not present

## 2020-09-20 DIAGNOSIS — D649 Anemia, unspecified: Secondary | ICD-10-CM | POA: Diagnosis not present

## 2020-09-20 DIAGNOSIS — I509 Heart failure, unspecified: Secondary | ICD-10-CM | POA: Diagnosis not present

## 2020-09-20 DIAGNOSIS — G5603 Carpal tunnel syndrome, bilateral upper limbs: Secondary | ICD-10-CM | POA: Diagnosis not present

## 2020-09-20 DIAGNOSIS — M79671 Pain in right foot: Secondary | ICD-10-CM | POA: Diagnosis not present

## 2020-09-23 DIAGNOSIS — N2581 Secondary hyperparathyroidism of renal origin: Secondary | ICD-10-CM | POA: Diagnosis not present

## 2020-09-23 DIAGNOSIS — R42 Dizziness and giddiness: Secondary | ICD-10-CM | POA: Diagnosis not present

## 2020-09-23 DIAGNOSIS — I129 Hypertensive chronic kidney disease with stage 1 through stage 4 chronic kidney disease, or unspecified chronic kidney disease: Secondary | ICD-10-CM | POA: Diagnosis not present

## 2020-09-23 DIAGNOSIS — N184 Chronic kidney disease, stage 4 (severe): Secondary | ICD-10-CM | POA: Diagnosis not present

## 2020-09-23 DIAGNOSIS — D631 Anemia in chronic kidney disease: Secondary | ICD-10-CM | POA: Diagnosis not present

## 2020-09-23 DIAGNOSIS — R809 Proteinuria, unspecified: Secondary | ICD-10-CM | POA: Diagnosis not present

## 2020-10-02 DIAGNOSIS — F251 Schizoaffective disorder, depressive type: Secondary | ICD-10-CM | POA: Insufficient documentation

## 2020-10-02 DIAGNOSIS — G4730 Sleep apnea, unspecified: Secondary | ICD-10-CM | POA: Insufficient documentation

## 2020-10-02 DIAGNOSIS — R413 Other amnesia: Secondary | ICD-10-CM | POA: Insufficient documentation

## 2020-10-02 DIAGNOSIS — M1990 Unspecified osteoarthritis, unspecified site: Secondary | ICD-10-CM | POA: Insufficient documentation

## 2020-10-02 DIAGNOSIS — I252 Old myocardial infarction: Secondary | ICD-10-CM | POA: Insufficient documentation

## 2020-10-02 DIAGNOSIS — D649 Anemia, unspecified: Secondary | ICD-10-CM | POA: Insufficient documentation

## 2020-10-02 DIAGNOSIS — E785 Hyperlipidemia, unspecified: Secondary | ICD-10-CM | POA: Insufficient documentation

## 2020-10-02 DIAGNOSIS — H5410 Blindness, one eye, low vision other eye, unspecified eyes: Secondary | ICD-10-CM | POA: Insufficient documentation

## 2020-10-02 DIAGNOSIS — F32A Depression, unspecified: Secondary | ICD-10-CM | POA: Insufficient documentation

## 2020-10-02 DIAGNOSIS — E119 Type 2 diabetes mellitus without complications: Secondary | ICD-10-CM | POA: Insufficient documentation

## 2020-10-02 DIAGNOSIS — E039 Hypothyroidism, unspecified: Secondary | ICD-10-CM | POA: Insufficient documentation

## 2020-10-02 DIAGNOSIS — I639 Cerebral infarction, unspecified: Secondary | ICD-10-CM | POA: Insufficient documentation

## 2020-10-02 DIAGNOSIS — F419 Anxiety disorder, unspecified: Secondary | ICD-10-CM | POA: Insufficient documentation

## 2020-10-02 DIAGNOSIS — Z9289 Personal history of other medical treatment: Secondary | ICD-10-CM | POA: Insufficient documentation

## 2020-10-02 DIAGNOSIS — N289 Disorder of kidney and ureter, unspecified: Secondary | ICD-10-CM | POA: Insufficient documentation

## 2020-10-02 DIAGNOSIS — I509 Heart failure, unspecified: Secondary | ICD-10-CM | POA: Insufficient documentation

## 2020-10-02 DIAGNOSIS — J449 Chronic obstructive pulmonary disease, unspecified: Secondary | ICD-10-CM | POA: Insufficient documentation

## 2020-10-02 DIAGNOSIS — K219 Gastro-esophageal reflux disease without esophagitis: Secondary | ICD-10-CM | POA: Insufficient documentation

## 2020-10-02 DIAGNOSIS — H547 Unspecified visual loss: Secondary | ICD-10-CM | POA: Insufficient documentation

## 2020-10-03 ENCOUNTER — Ambulatory Visit: Payer: Medicare HMO | Admitting: Cardiology

## 2020-10-04 ENCOUNTER — Telehealth: Payer: Self-pay | Admitting: *Deleted

## 2020-10-04 ENCOUNTER — Other Ambulatory Visit: Payer: Self-pay | Admitting: Endocrinology

## 2020-10-04 DIAGNOSIS — I509 Heart failure, unspecified: Secondary | ICD-10-CM | POA: Diagnosis not present

## 2020-10-04 DIAGNOSIS — G5603 Carpal tunnel syndrome, bilateral upper limbs: Secondary | ICD-10-CM | POA: Diagnosis not present

## 2020-10-04 DIAGNOSIS — J449 Chronic obstructive pulmonary disease, unspecified: Secondary | ICD-10-CM | POA: Diagnosis not present

## 2020-10-04 DIAGNOSIS — D649 Anemia, unspecified: Secondary | ICD-10-CM | POA: Diagnosis not present

## 2020-10-04 DIAGNOSIS — E1065 Type 1 diabetes mellitus with hyperglycemia: Secondary | ICD-10-CM

## 2020-10-04 DIAGNOSIS — N184 Chronic kidney disease, stage 4 (severe): Secondary | ICD-10-CM | POA: Diagnosis not present

## 2020-10-04 DIAGNOSIS — M79671 Pain in right foot: Secondary | ICD-10-CM | POA: Diagnosis not present

## 2020-10-04 DIAGNOSIS — I679 Cerebrovascular disease, unspecified: Secondary | ICD-10-CM | POA: Diagnosis not present

## 2020-10-04 DIAGNOSIS — R110 Nausea: Secondary | ICD-10-CM | POA: Diagnosis not present

## 2020-10-04 DIAGNOSIS — E785 Hyperlipidemia, unspecified: Secondary | ICD-10-CM | POA: Diagnosis not present

## 2020-10-04 DIAGNOSIS — Z23 Encounter for immunization: Secondary | ICD-10-CM | POA: Diagnosis not present

## 2020-10-04 NOTE — Telephone Encounter (Signed)
-----   Message from Elayne Snare, MD sent at 10/04/2020  8:06 AM EST ----- Regarding: Next lab work She will need to be fasting for her next labs for C-peptide and blood sugar as follows, can do same day of the office visit ----- Message ----- From: Ocie Doyne, RN Sent: 10/02/2020   2:44 PM EST To: Elayne Snare, MD, Roxanna Mew, CMA  Please order a c-peptide and FBS for her to get her pump.  And Suanne Marker, please call to schedule labs.  She needs to be fasting, and Blood sugar needs to be less than 225 before the draw. Thank you

## 2020-10-08 ENCOUNTER — Ambulatory Visit: Payer: Medicare HMO | Admitting: Endocrinology

## 2020-10-11 DIAGNOSIS — E1065 Type 1 diabetes mellitus with hyperglycemia: Secondary | ICD-10-CM | POA: Diagnosis not present

## 2020-10-15 DIAGNOSIS — I5042 Chronic combined systolic (congestive) and diastolic (congestive) heart failure: Secondary | ICD-10-CM | POA: Diagnosis not present

## 2020-10-18 DIAGNOSIS — N184 Chronic kidney disease, stage 4 (severe): Secondary | ICD-10-CM | POA: Diagnosis not present

## 2020-10-18 DIAGNOSIS — G5603 Carpal tunnel syndrome, bilateral upper limbs: Secondary | ICD-10-CM | POA: Diagnosis not present

## 2020-10-18 DIAGNOSIS — I2510 Atherosclerotic heart disease of native coronary artery without angina pectoris: Secondary | ICD-10-CM | POA: Diagnosis not present

## 2020-10-18 DIAGNOSIS — R110 Nausea: Secondary | ICD-10-CM | POA: Diagnosis not present

## 2020-10-18 DIAGNOSIS — D649 Anemia, unspecified: Secondary | ICD-10-CM | POA: Diagnosis not present

## 2020-10-18 DIAGNOSIS — I509 Heart failure, unspecified: Secondary | ICD-10-CM | POA: Diagnosis not present

## 2020-10-18 DIAGNOSIS — M79671 Pain in right foot: Secondary | ICD-10-CM | POA: Diagnosis not present

## 2020-10-18 DIAGNOSIS — I679 Cerebrovascular disease, unspecified: Secondary | ICD-10-CM | POA: Diagnosis not present

## 2020-10-18 DIAGNOSIS — J449 Chronic obstructive pulmonary disease, unspecified: Secondary | ICD-10-CM | POA: Diagnosis not present

## 2020-10-31 ENCOUNTER — Other Ambulatory Visit: Payer: Self-pay | Admitting: Cardiology

## 2020-11-01 DIAGNOSIS — G4733 Obstructive sleep apnea (adult) (pediatric): Secondary | ICD-10-CM | POA: Diagnosis not present

## 2020-11-01 DIAGNOSIS — J449 Chronic obstructive pulmonary disease, unspecified: Secondary | ICD-10-CM | POA: Diagnosis not present

## 2020-11-01 DIAGNOSIS — Z794 Long term (current) use of insulin: Secondary | ICD-10-CM | POA: Diagnosis not present

## 2020-11-01 DIAGNOSIS — N184 Chronic kidney disease, stage 4 (severe): Secondary | ICD-10-CM | POA: Diagnosis not present

## 2020-11-01 DIAGNOSIS — I679 Cerebrovascular disease, unspecified: Secondary | ICD-10-CM | POA: Diagnosis not present

## 2020-11-01 DIAGNOSIS — R110 Nausea: Secondary | ICD-10-CM | POA: Diagnosis not present

## 2020-11-01 DIAGNOSIS — M79671 Pain in right foot: Secondary | ICD-10-CM | POA: Diagnosis not present

## 2020-11-01 DIAGNOSIS — E1121 Type 2 diabetes mellitus with diabetic nephropathy: Secondary | ICD-10-CM | POA: Diagnosis not present

## 2020-11-01 DIAGNOSIS — I509 Heart failure, unspecified: Secondary | ICD-10-CM | POA: Diagnosis not present

## 2020-11-06 DIAGNOSIS — I509 Heart failure, unspecified: Secondary | ICD-10-CM | POA: Diagnosis not present

## 2020-11-06 DIAGNOSIS — I10 Essential (primary) hypertension: Secondary | ICD-10-CM | POA: Diagnosis not present

## 2020-11-06 DIAGNOSIS — J449 Chronic obstructive pulmonary disease, unspecified: Secondary | ICD-10-CM | POA: Diagnosis not present

## 2020-11-06 DIAGNOSIS — F3341 Major depressive disorder, recurrent, in partial remission: Secondary | ICD-10-CM | POA: Diagnosis not present

## 2020-11-06 DIAGNOSIS — G5603 Carpal tunnel syndrome, bilateral upper limbs: Secondary | ICD-10-CM | POA: Diagnosis not present

## 2020-11-06 DIAGNOSIS — F419 Anxiety disorder, unspecified: Secondary | ICD-10-CM | POA: Diagnosis not present

## 2020-11-06 DIAGNOSIS — E1121 Type 2 diabetes mellitus with diabetic nephropathy: Secondary | ICD-10-CM | POA: Diagnosis not present

## 2020-11-06 DIAGNOSIS — H6692 Otitis media, unspecified, left ear: Secondary | ICD-10-CM | POA: Diagnosis not present

## 2020-11-06 DIAGNOSIS — Z794 Long term (current) use of insulin: Secondary | ICD-10-CM | POA: Diagnosis not present

## 2020-11-11 ENCOUNTER — Other Ambulatory Visit: Payer: Self-pay

## 2020-11-11 ENCOUNTER — Encounter: Payer: Self-pay | Admitting: Endocrinology

## 2020-11-11 ENCOUNTER — Ambulatory Visit (INDEPENDENT_AMBULATORY_CARE_PROVIDER_SITE_OTHER): Payer: Medicare HMO | Admitting: Endocrinology

## 2020-11-11 VITALS — BP 150/90 | HR 62 | Ht 62.0 in | Wt 185.8 lb

## 2020-11-11 DIAGNOSIS — E1065 Type 1 diabetes mellitus with hyperglycemia: Secondary | ICD-10-CM | POA: Diagnosis not present

## 2020-11-11 DIAGNOSIS — N1832 Chronic kidney disease, stage 3b: Secondary | ICD-10-CM

## 2020-11-11 DIAGNOSIS — I10 Essential (primary) hypertension: Secondary | ICD-10-CM

## 2020-11-11 LAB — POCT GLYCOSYLATED HEMOGLOBIN (HGB A1C): Hemoglobin A1C: 6.6 % — AB (ref 4.0–5.6)

## 2020-11-11 NOTE — Patient Instructions (Addendum)
12 Tresiba and keep am sugar 120-140  Must take NOVOLOG  1 unit per starch with meal or right after the meal regardless of sugar

## 2020-11-11 NOTE — Progress Notes (Signed)
Patient ID: Deanna Landry, female   DOB: [birth year], 49 y.o.   MRN: [id number]           Reason for Appointment : Follow-up for Type 1 Diabetes  History of Present Illness   Referring HCP:Lee, Keung         Diagnosis: Type 1 diabetes mellitus, date of diagnosis: Age 14         Previous history:   She has been on insulin since her diagnosis when she had significant symptomatic hypoglycemia. Has been on various types of insulin regimens in the past but usually not followed by endocrinologist The last 5 or 6 years she has been on Levemir and NovoLog Previous level of control is variable with A1c only inconsistently below 7  Recent history:   INSULIN regimen:  BASAL insulin: 14 units Tresiba daily  Mealtime insulin: NovoLog 2-3 units before meals  Non-insulin hypoglycemic drugs: Trulicity 1.5 mg weekly  A1c is 6.6 compared to 7.5  Current management, blood sugar patterns and problems identified:    She was supposed to get the C-peptide level drawn in order to get her T-Slim pump and the Dexcom but she did not come  She is again having issues with periodic low blood sugars as seen on her freestyle libre although not clear how often this is falsely low  Because of fear of hypoglycemia she does not take NovoLog for her meals when her blood sugars are low or low normal including last night and this morning  Subsequently she will have readings at least over 200 and sometimes over 300 postprandially  Also she will overtreat low blood sugars  Does not consistently have low sugars overnight but usually will eat extra food or snacks before bedtime to increase her blood sugar  Occasional low sugar may be related to overcorrection with NovoLog   CONTINUOUS GLUCOSE MONITORING RECORD INTERPRETATION    Dates of Recording: Last 2 weeks  Sensor description: [name] version 2 Interpretation:  She thinks her blood sugars are about 30 mg lower than the actual reading on her sensor as  before  Blood sugars are within the target range on an average throughout the day and night  However she has significant variability with some tendency to high readings after meals  Hyperglycemia occurring periodically after certain meals and has also excessive rebound from low sugars  No consistent postprandial pattern seen  HYPOGLYCEMIA has been noted three times overnight and occasionally before lunch and dinner  Generally overnight blood sugars are averaging 150 at midnight and decreasing by morning with inconsistent patterns  Results statistics:  CGM use % of time  64  2-week average/GV  140/49  Time in range       61% was 55  % Time Above 180  17  % Time above 250 7  % Time Below 70  15     PRE-MEAL Fasting Lunch Dinner Bedtime Overall  Glucose range:       Averages:  146  128  122  157  140   POST-MEAL PC Breakfast PC Lunch PC Dinner  Glucose range:     Averages:  157  138  146   Previously:    CGM use % of time 84  Average and SD  158  Time in range  55     %, was 50  % Time Above 180  19  % Time above 250  13  % Time Below target  13, was 7  PRE-MEAL Fasting Lunch Dinner Bedtime Overall  Glucose range:       Mean/median:  192  146  153   158   POST-MEAL PC Breakfast PC Lunch PC Dinner  Glucose range:     Mean/median:  171  132  137    Glycemic patterns summary: Blood sugars are highly variable with daily blood sugar averaging anywhere between 109 and 239 All the highest blood sugars are generally early morning and late evening there is no consistent pattern also.  Also hypoglycemia has occurred more than half the days in the last 2 weeks and greater when compared to her previous tracing   Hypoglycemia:  occurs mostly overnight Factors causing hyperglycemia: Excessive Levemir at night Symptoms of hypoglycemia:none or minimal Treatment of hypoglycemia: Juices, if patient is unconscious mother will call the ambulance         Self-care: The diet  that the patient has been following is: None, does not know carbohydrate counting Immediately skipping breakfast, lunch will be assignments, dinner usually consists of bread or pasta Snacks will be peanut butter crackers, yogurt, cottage cheese, fruit Eating out only about once or twice a month   Mealtimes are: Breakfast none Lunch: 1 pm Dinner: 6pm         Exercise: None          Dietician consultation none.          Wt Readings from Last 3 Encounters:  11/11/20 185 lb 12.8 oz (84.3 kg)  08/23/20 175 lb 0.7 oz (79.4 kg)  08/12/20 174 lb 6.4 oz (79.1 kg)    Diabetes labs:  Lab Results  Component Value Date   HGBA1C 6.6 (A) 11/11/2020   HGBA1C 7.5 08/08/2020   HGBA1C 7.1 (A) 04/26/2020   Lab Results  Component Value Date   CREATININE 2.12 (H) 08/23/2020    No results found for: MICRALBCREAT   Allergies as of 11/11/2020   No Known Allergies     Medication List       Accurate as of November 11, 2020  4:15 PM. If you have any questions, ask your nurse or doctor.        albuterol 108 (90 Base) MCG/ACT inhaler Commonly known as: VENTOLIN HFA Inhale 1-2 puffs into the lungs every 6 (six) hours as needed for wheezing or shortness of breath.   amLODipine 10 MG tablet Commonly known as: NORVASC Take 10 mg by mouth daily. Take 1/2 tablet daily   aspirin EC 81 MG tablet Take 81 mg by mouth daily.   atorvastatin 80 MG tablet Commonly known as: LIPITOR Take 80 mg by mouth daily.   buPROPion 300 MG 24 hr tablet Commonly known as: WELLBUTRIN XL Take 300 mg by mouth daily.   clopidogrel 75 MG tablet Commonly known as: PLAVIX Take 75 mg by mouth daily.   Dexcom G6 Receiver Devi Continuous glucose monitoring   Dexcom G6 Sensor Misc Apply new sensor every 10 days   Dexcom G6 Transmitter Misc Replace every 3 months   Easy Comfort Pen Needles 31G X 8 MM Misc Generic drug: Insulin Pen Needle Check sugar 3x  daily   glucose blood test strip Use as instructed    Gvoke HypoPen 2-Pack 1 MG/0.2ML Soaj Generic drug: Glucagon Inject 1 mg into the skin as needed. For severe lows   HYDROcodone-acetaminophen 5-325 MG tablet Commonly known as: NORCO/VICODIN   isosorbide dinitrate 20 MG tablet Commonly known as: ISORDIL Take 20 mg by mouth 2 (two) times daily.  levothyroxine 50 MCG tablet Commonly known as: SYNTHROID Take 50 mcg by mouth daily before breakfast.   loratadine 10 MG tablet Commonly known as: CLARITIN Take 10 mg by mouth daily as needed for allergies.   methocarbamol 750 MG tablet Commonly known as: Robaxin-750 Take 1 tablet (750 mg total) by mouth every 6 (six) hours as needed for muscle spasms.   metoCLOPramide 5 MG tablet Commonly known as: REGLAN Take 5 mg by mouth in the morning, at noon, and at bedtime.   metoprolol tartrate 25 MG tablet Commonly known as: LOPRESSOR Take 12.5 mg by mouth 2 (two) times daily.   NovoLOG 100 UNIT/ML injection Generic drug: insulin aspart Inject 2-10 Units into the skin with breakfast, with lunch, and with evening meal.   oxyCODONE-acetaminophen 5-325 MG tablet Commonly known as: Percocet Take 1 tablet by mouth every 4 (four) hours as needed for severe pain.   pantoprazole 40 MG tablet Commonly known as: PROTONIX Take 40 mg by mouth daily.   pregabalin 75 MG capsule Commonly known as: LYRICA Take 75 mg by mouth 2 (two) times daily.   promethazine 25 MG tablet Commonly known as: PHENERGAN Take 25 mg by mouth every 6 (six) hours as needed for nausea/vomiting.   ranolazine 500 MG 12 hr tablet Commonly known as: RANEXA TAKE 1 TABLET BY MOUTH TWICE A DAY   sertraline 100 MG tablet Commonly known as: ZOLOFT Take 100 mg by mouth daily.   Systane 0.4-0.3 % Gel ophthalmic gel Generic drug: Polyethyl Glycol-Propyl Glycol Place 1 application into both eyes 3 (three) times daily as needed (dry/irritated eyes.).   [name] FlexTouch 200 UNIT/ML FlexTouch Pen Generic drug: insulin  degludec INJECT 30 UNITS INTO THE SKIN DAILY. What changed: See the new instructions.   Trulicity 1.5 YW/7.3XT Sopn Generic drug: Dulaglutide Inject 1.5 mg into the skin every Monday.       Allergies: No Known Allergies  Past Medical History:  Diagnosis Date  . Acute kidney injury superimposed on CKD ([place]) 03/15/2019  . Acute respiratory failure with hypoxia ([place]) 03/15/2019  . Adjustment disorder with mixed anxiety and depressed mood 03/11/2019  . Anemia   . Anxiety   . Arthritis    right ankle  . Blind    right eye  . CHF (congestive heart failure) ([place])   . Closed right pilon fracture, initial encounter 07/26/2019  . COPD (chronic obstructive pulmonary disease) ([place])   . Coronary artery disease   . Depression   . Diabetes mellitus type 2 in obese ([place]) 07/26/2019  . Diabetes mellitus without complication ([place])    type 1  . Diabetic polyneuropathy ([place]) 05/04/2016  . Dyslipidemia 01/03/2020  . Fever 03/10/2019  . GERD (gastroesophageal reflux disease)   . History of blood transfusion   . HLD (hyperlipidemia)   . Hyperglycemia   . Hypertension   . Hypothyroidism   . Ileus, postoperative ([place])   . Major depression, recurrent ([place]) 03/09/2019  . MDD (major depressive disorder), severe ([place]) 03/09/2019  . Myocardial infarct, old    2020, s/p DES RCA, RPDA 10/23/19  . Neck pain 04/21/2016  . Pneumonia 10/2019  . Renal disorder    stage 3  . Sepsis ([place]) 03/10/2019  . Short-term memory loss    mild  . Sleep apnea    study done 9/27 and 08/13/20 - no results yet 08/21/20  . Stroke ([place])   . Suicidal ideation 03/15/2019  . Type 1 diabetes mellitus without complication ([place]) 0/62/6948  . Wound  infection after surgery (Right ankle) 10/04/2019    Past Surgical History:  Procedure Laterality Date  . ABDOMINAL HYSTERECTOMY  2014  . ANKLE FUSION Right 08/23/2020   Procedure: ANKLE FUSION;  Surgeon: Shona Needles, MD;  Location: [facility];  Service: Orthopedics;  Laterality: Right;  .  APPLICATION OF WOUND VAC Right 10/11/2019   Procedure: WOUND VAC CHANGE TO RIGHT LEG;  Surgeon: Shona Needles, MD;  Location: [facility];  Service: Orthopedics;  Laterality: Right;  . CORONARY ANGIOPLASTY  10/23/2019   DES RCA, DES RPDA 10/23/19 ([place])  . EYE SURGERY    . HARDWARE REMOVAL Right 10/06/2019   Procedure: HARDWARE REMOVAL;  Surgeon: Shona Needles, MD;  Location: [facility];  Service: Orthopedics;  Laterality: Right;  . HARDWARE REMOVAL Right 08/23/2020   Procedure: HARDWARE REMOVAL;  Surgeon: Shona Needles, MD;  Location: [facility];  Service: Orthopedics;  Laterality: Right;  . I & D EXTREMITY Right 10/06/2019   Procedure: IRRIGATION AND DEBRIDEMENT EXTREMITY;  Surgeon: Shona Needles, MD;  Location: [facility];  Service: Orthopedics;  Laterality: Right;  . I & D EXTREMITY Right 10/09/2019   Procedure: IRRIGATION AND DEBRIDEMENT EXTREMITY and WOUND VAC CHANGE RIGHT ANKLE;  Surgeon: Shona Needles, MD;  Location: [facility];  Service: Orthopedics;  Laterality: Right;  . ORIF ANKLE FRACTURE Right 07/26/2019   Procedure: OPEN REDUCTION INTERNAL FIXATION (ORIF) ANKLE FRACTURE;  Surgeon: Shona Needles, MD;  Location: [facility];  Service: Orthopedics;  Laterality: Right;  OPEN REDUCTION INTERNAL FIXATION (ORIF) ANKLE FRACTURE   . TOE AMPUTATION      Family History  Problem Relation Age of Onset  . Diabetes Mellitus II Mother   . Stroke Mother   . Heart attack Mother   . Stomach cancer Father   . Congestive Heart Failure Father   . Diabetes Sister   . Congestive Heart Failure Maternal Grandfather     Social History:  reports that she has never smoked. She has never used smokeless tobacco. She reports previous alcohol use. She reports previous drug use.      Review of Systems  HYPOTHYROIDISM: Treated by PCP with levothyroxine 50 mcg and last TSH 4.7 done in August      Lipids: Last LDL was 64 in June 2021 She is on a statin drug using 80 mg Lipitor  No results found for: CHOL, HDL, LDLCALC,  LDLDIRECT, TRIG, CHOLHDL  Last dilated eye exam was in 3/21  Peripheral neuropathy: She will get some pains and paresthesia in her legs treated with Lyrica and tramadol.  Also has numbness in her hands  DIABETES COMPLICATIONS: Neuropathy, retinopathy nephropathy   CKD: Labs as follows, recently seen by nephrologist  Lab Results  Component Value Date   CREATININE 2.12 (H) 08/23/2020   CREATININE 1.9 (A) 08/08/2020   CREATININE 2.14 (H) 07/12/2020   Blood pressure being monitored by nephrologist She thinks her blood pressure is higher today because of being in pain  BP Readings from Last 3 Encounters:  11/11/20 (!) 150/90  08/23/20 134/79  08/12/20 128/78       Physical Examination:  BP (!) 150/90   Pulse 62   Ht 5\' 2"  (1.575 m)   Wt 185 lb 12.8 oz (84.3 kg)   LMP  (LMP Unknown) Comment: hysterectomy 2014  SpO2 97%   BMI 33.98 kg/m     ASSESSMENT:  Diabetes type 1, on basal bolus insulin  A1c 6.6  Although A1c is better and she is  receiving greater time in range blood sugars are still labile and inconsistent She has hyperglycemia related to skipping her NovoLog or rebound from low sugars Also may be getting too many snacks late at night Discussed that she will need to reduce her [name] in order to avoid any low normal readings and her fastings target should be relatively higher than normal Discussed importance of taking mealtime coverage Also discussed importance of better diabetes control to prevent further worsening of her renal dysfunction She will also benefit from getting the insulin pump which she is still waiting for as she did not come in for her C-peptide test  HYPOTHYROIDISM: She will have lab work done at her upcoming visit with PCP   PLAN:   Recommendations . [name] will be reduced to 12 units and if before meal sugars are still relatively low she can cut it down to 11 . Continue Trulicity weekly as before . Cut back on high fat foods and  sweets . She can try taking 1 unit for each 15 g of carbohydrate as she does not want to count carbohydrates . She needs to take insulin with every meal and a large snack even if she has to take it after eating . She should not skip the NovoLog even if her blood sugar is low normal . Add extra 1 unit for any high fat meals or cheese or fried food . Avoid high-fat meals in general . Compare fingerstick to freestyle libre . She will come for the fasting lab work to check her C-peptide  Encourage her to get her Covid booster as soon as possible  Patient Instructions  56 Tresiba and keep am sugar 120-140  Must take NOVOLOG  1 unit per starch with meal or right after the meal regardless of sugar        Elayne Snare 11/11/2020, 4:15 PM      Note: This note was prepared with Dragon voice recognition system technology. Any transcriptional errors that result from this process are unintentional.

## 2020-11-14 ENCOUNTER — Other Ambulatory Visit: Payer: Self-pay

## 2020-11-14 ENCOUNTER — Other Ambulatory Visit (INDEPENDENT_AMBULATORY_CARE_PROVIDER_SITE_OTHER): Payer: Medicare HMO

## 2020-11-14 DIAGNOSIS — N184 Chronic kidney disease, stage 4 (severe): Secondary | ICD-10-CM | POA: Diagnosis not present

## 2020-11-14 DIAGNOSIS — R110 Nausea: Secondary | ICD-10-CM | POA: Diagnosis not present

## 2020-11-14 DIAGNOSIS — I509 Heart failure, unspecified: Secondary | ICD-10-CM | POA: Diagnosis not present

## 2020-11-14 DIAGNOSIS — M79671 Pain in right foot: Secondary | ICD-10-CM | POA: Diagnosis not present

## 2020-11-14 DIAGNOSIS — I10 Essential (primary) hypertension: Secondary | ICD-10-CM | POA: Diagnosis not present

## 2020-11-14 DIAGNOSIS — E1065 Type 1 diabetes mellitus with hyperglycemia: Secondary | ICD-10-CM

## 2020-11-14 DIAGNOSIS — I5042 Chronic combined systolic (congestive) and diastolic (congestive) heart failure: Secondary | ICD-10-CM | POA: Diagnosis not present

## 2020-11-14 DIAGNOSIS — G4733 Obstructive sleep apnea (adult) (pediatric): Secondary | ICD-10-CM | POA: Diagnosis not present

## 2020-11-14 DIAGNOSIS — I679 Cerebrovascular disease, unspecified: Secondary | ICD-10-CM | POA: Diagnosis not present

## 2020-11-14 DIAGNOSIS — Z794 Long term (current) use of insulin: Secondary | ICD-10-CM | POA: Diagnosis not present

## 2020-11-14 DIAGNOSIS — J449 Chronic obstructive pulmonary disease, unspecified: Secondary | ICD-10-CM | POA: Diagnosis not present

## 2020-11-14 DIAGNOSIS — E1121 Type 2 diabetes mellitus with diabetic nephropathy: Secondary | ICD-10-CM | POA: Diagnosis not present

## 2020-11-14 LAB — GLUCOSE, RANDOM: Glucose, Bld: 61 mg/dL — ABNORMAL LOW (ref 70–99)

## 2020-11-15 LAB — C-PEPTIDE: C-Peptide: 0.4 ng/mL — ABNORMAL LOW (ref 1.1–4.4)

## 2020-11-27 ENCOUNTER — Other Ambulatory Visit: Payer: Self-pay

## 2020-11-27 NOTE — Patient Outreach (Addendum)
[facility]) Care Management  11/27/2020  Deanna Landry 03-25-1971 [id number]   Telephone Assessment Quarterly Call   Unsuccessful quarterly outreach attempt to patient.    Plan: RN CM will make quarterly outreach attempt to patient within the month of [name] if no return call from patient. RN CM will send unsuccessful outreach letter to patient.  Deanna Montgomery, RN,BSN,CCM [facility] Management Telephonic Care Management Coordinator Direct Phone: [phone] Toll Free: [phone] Fax: [phone]

## 2020-11-28 ENCOUNTER — Emergency Department (HOSPITAL_COMMUNITY): Payer: Medicare HMO

## 2020-11-28 ENCOUNTER — Encounter (HOSPITAL_COMMUNITY): Payer: Self-pay

## 2020-11-28 ENCOUNTER — Inpatient Hospital Stay (HOSPITAL_COMMUNITY)
Admission: EM | Admit: 2020-11-28 | Discharge: 2020-12-04 | DRG: 177 | Disposition: A | Discharge status: Home / Self Care | Payer: Medicare HMO | Attending: Internal Medicine | Admitting: Internal Medicine

## 2020-11-28 ENCOUNTER — Ambulatory Visit: Payer: Self-pay

## 2020-11-28 DIAGNOSIS — K3184 Gastroparesis: Secondary | ICD-10-CM | POA: Diagnosis present

## 2020-11-28 DIAGNOSIS — R0602 Shortness of breath: Secondary | ICD-10-CM | POA: Diagnosis not present

## 2020-11-28 DIAGNOSIS — R000 Tachycardia, unspecified: Secondary | ICD-10-CM | POA: Diagnosis not present

## 2020-11-28 DIAGNOSIS — I10 Essential (primary) hypertension: Secondary | ICD-10-CM

## 2020-11-28 DIAGNOSIS — J1282 Pneumonia due to coronavirus disease 2019: Secondary | ICD-10-CM | POA: Diagnosis present

## 2020-11-28 DIAGNOSIS — R110 Nausea: Secondary | ICD-10-CM | POA: Diagnosis not present

## 2020-11-28 DIAGNOSIS — Z823 Family history of stroke: Secondary | ICD-10-CM

## 2020-11-28 DIAGNOSIS — Z981 Arthrodesis status: Secondary | ICD-10-CM | POA: Diagnosis not present

## 2020-11-28 DIAGNOSIS — I130 Hypertensive heart and chronic kidney disease with heart failure and stage 1 through stage 4 chronic kidney disease, or unspecified chronic kidney disease: Secondary | ICD-10-CM | POA: Diagnosis present

## 2020-11-28 DIAGNOSIS — E785 Hyperlipidemia, unspecified: Secondary | ICD-10-CM | POA: Diagnosis present

## 2020-11-28 DIAGNOSIS — R52 Pain, unspecified: Secondary | ICD-10-CM | POA: Diagnosis not present

## 2020-11-28 DIAGNOSIS — R509 Fever, unspecified: Secondary | ICD-10-CM

## 2020-11-28 DIAGNOSIS — E1043 Type 1 diabetes mellitus with diabetic autonomic (poly)neuropathy: Secondary | ICD-10-CM | POA: Diagnosis present

## 2020-11-28 DIAGNOSIS — Z8249 Family history of ischemic heart disease and other diseases of the circulatory system: Secondary | ICD-10-CM

## 2020-11-28 DIAGNOSIS — I2510 Atherosclerotic heart disease of native coronary artery without angina pectoris: Secondary | ICD-10-CM | POA: Diagnosis present

## 2020-11-28 DIAGNOSIS — E109 Type 1 diabetes mellitus without complications: Secondary | ICD-10-CM | POA: Diagnosis present

## 2020-11-28 DIAGNOSIS — R112 Nausea with vomiting, unspecified: Secondary | ICD-10-CM | POA: Diagnosis not present

## 2020-11-28 DIAGNOSIS — Z79890 Hormone replacement therapy (postmenopausal): Secondary | ICD-10-CM

## 2020-11-28 DIAGNOSIS — Z800 Family history of malignant neoplasm of digestive organs: Secondary | ICD-10-CM

## 2020-11-28 DIAGNOSIS — T380X5A Adverse effect of glucocorticoids and synthetic analogues, initial encounter: Secondary | ICD-10-CM | POA: Diagnosis not present

## 2020-11-28 DIAGNOSIS — H548 Legal blindness, as defined in USA: Secondary | ICD-10-CM | POA: Diagnosis present

## 2020-11-28 DIAGNOSIS — E1065 Type 1 diabetes mellitus with hyperglycemia: Secondary | ICD-10-CM | POA: Diagnosis present

## 2020-11-28 DIAGNOSIS — Z6833 Body mass index (BMI) 33.0-33.9, adult: Secondary | ICD-10-CM

## 2020-11-28 DIAGNOSIS — U071 COVID-19: Principal | ICD-10-CM | POA: Diagnosis present

## 2020-11-28 DIAGNOSIS — I5032 Chronic diastolic (congestive) heart failure: Secondary | ICD-10-CM | POA: Diagnosis present

## 2020-11-28 DIAGNOSIS — Z7902 Long term (current) use of antithrombotics/antiplatelets: Secondary | ICD-10-CM

## 2020-11-28 DIAGNOSIS — Z794 Long term (current) use of insulin: Secondary | ICD-10-CM

## 2020-11-28 DIAGNOSIS — Z7982 Long term (current) use of aspirin: Secondary | ICD-10-CM

## 2020-11-28 DIAGNOSIS — D72819 Decreased white blood cell count, unspecified: Secondary | ICD-10-CM | POA: Diagnosis present

## 2020-11-28 DIAGNOSIS — R109 Unspecified abdominal pain: Secondary | ICD-10-CM | POA: Diagnosis not present

## 2020-11-28 DIAGNOSIS — K5900 Constipation, unspecified: Secondary | ICD-10-CM | POA: Diagnosis present

## 2020-11-28 DIAGNOSIS — Z79899 Other long term (current) drug therapy: Secondary | ICD-10-CM

## 2020-11-28 DIAGNOSIS — E669 Obesity, unspecified: Secondary | ICD-10-CM | POA: Diagnosis present

## 2020-11-28 DIAGNOSIS — R1110 Vomiting, unspecified: Secondary | ICD-10-CM | POA: Diagnosis present

## 2020-11-28 DIAGNOSIS — I509 Heart failure, unspecified: Secondary | ICD-10-CM

## 2020-11-28 DIAGNOSIS — I252 Old myocardial infarction: Secondary | ICD-10-CM

## 2020-11-28 DIAGNOSIS — E039 Hypothyroidism, unspecified: Secondary | ICD-10-CM | POA: Diagnosis present

## 2020-11-28 DIAGNOSIS — Z8673 Personal history of transient ischemic attack (TIA), and cerebral infarction without residual deficits: Secondary | ICD-10-CM

## 2020-11-28 DIAGNOSIS — Z209 Contact with and (suspected) exposure to unspecified communicable disease: Secondary | ICD-10-CM | POA: Diagnosis not present

## 2020-11-28 DIAGNOSIS — F339 Major depressive disorder, recurrent, unspecified: Secondary | ICD-10-CM | POA: Diagnosis present

## 2020-11-28 DIAGNOSIS — R0600 Dyspnea, unspecified: Secondary | ICD-10-CM

## 2020-11-28 DIAGNOSIS — N184 Chronic kidney disease, stage 4 (severe): Secondary | ICD-10-CM | POA: Diagnosis present

## 2020-11-28 DIAGNOSIS — Z833 Family history of diabetes mellitus: Secondary | ICD-10-CM

## 2020-11-28 DIAGNOSIS — Z955 Presence of coronary angioplasty implant and graft: Secondary | ICD-10-CM

## 2020-11-28 DIAGNOSIS — E1022 Type 1 diabetes mellitus with diabetic chronic kidney disease: Secondary | ICD-10-CM | POA: Diagnosis present

## 2020-11-28 DIAGNOSIS — J440 Chronic obstructive pulmonary disease with acute lower respiratory infection: Secondary | ICD-10-CM | POA: Diagnosis present

## 2020-11-28 DIAGNOSIS — E1042 Type 1 diabetes mellitus with diabetic polyneuropathy: Secondary | ICD-10-CM | POA: Diagnosis present

## 2020-11-28 DIAGNOSIS — R069 Unspecified abnormalities of breathing: Secondary | ICD-10-CM | POA: Diagnosis not present

## 2020-11-28 LAB — BASIC METABOLIC PANEL
Anion gap: 14 (ref 5–15)
BUN: 24 mg/dL — ABNORMAL HIGH (ref 6–20)
CO2: 22 mmol/L (ref 22–32)
Calcium: 9.7 mg/dL (ref 8.9–10.3)
Chloride: 101 mmol/L (ref 98–111)
Creatinine, Ser: 2.24 mg/dL — ABNORMAL HIGH (ref 0.44–1.00)
GFR, Estimated: 26 mL/min — ABNORMAL LOW (ref >60)
Glucose, Bld: 165 mg/dL — ABNORMAL HIGH (ref 70–99)
Potassium: 5 mmol/L (ref 3.5–5.1)
Sodium: 137 mmol/L (ref 135–145)

## 2020-11-28 LAB — FERRITIN: Ferritin: 21 ng/mL (ref 11–307)

## 2020-11-28 LAB — PROCALCITONIN: Procalcitonin: 0.21 ng/mL

## 2020-11-28 LAB — CBC
HCT: 41.9 % (ref 36.0–46.0)
Hemoglobin: 13.2 g/dL (ref 12.0–15.0)
MCH: 26.7 pg (ref 26.0–34.0)
MCHC: 31.5 g/dL (ref 30.0–36.0)
MCV: 84.8 fL (ref 80.0–100.0)
Platelets: 180 10*3/uL (ref 150–400)
RBC: 4.94 MIL/uL (ref 3.87–5.11)
RDW: 13.7 % (ref 11.5–15.5)
WBC: 3.2 10*3/uL — ABNORMAL LOW (ref 4.0–10.5)
nRBC: 0 % (ref 0.0–0.2)

## 2020-11-28 LAB — GLUCOSE, CAPILLARY
Glucose-Capillary: 212 mg/dL — ABNORMAL HIGH (ref 70–99)
Glucose-Capillary: 404 mg/dL — ABNORMAL HIGH (ref 70–99)

## 2020-11-28 LAB — TROPONIN I (HIGH SENSITIVITY)
Troponin I (High Sensitivity): 8 ng/L (ref <18)
Troponin I (High Sensitivity): 9 ng/L (ref <18)

## 2020-11-28 LAB — HEPATITIS B SURFACE ANTIGEN: Hepatitis B Surface Ag: NONREACTIVE

## 2020-11-28 LAB — C-REACTIVE PROTEIN: CRP: 1.1 mg/dL — ABNORMAL HIGH (ref <1.0)

## 2020-11-28 LAB — HIV ANTIBODY (ROUTINE TESTING W REFLEX): HIV Screen 4th Generation wRfx: NONREACTIVE

## 2020-11-28 LAB — D-DIMER, QUANTITATIVE: D-Dimer, Quant: 0.52 ug/mL-FEU — ABNORMAL HIGH (ref 0.00–0.50)

## 2020-11-28 LAB — BRAIN NATRIURETIC PEPTIDE: B Natriuretic Peptide: 30.3 pg/mL (ref 0.0–100.0)

## 2020-11-28 LAB — FIBRINOGEN: Fibrinogen: 463 mg/dL (ref 210–475)

## 2020-11-28 LAB — RESP PANEL BY RT-PCR (FLU A&B, COVID) ARPGX2
Influenza A by PCR: NEGATIVE
Influenza B by PCR: NEGATIVE
SARS Coronavirus 2 by RT PCR: POSITIVE — AB

## 2020-11-28 LAB — LACTATE DEHYDROGENASE: LDH: 136 U/L (ref 98–192)

## 2020-11-28 MED ORDER — LORATADINE 10 MG PO TABS
10.0000 mg | ORAL_TABLET | Freq: Every day | ORAL | Status: DC | PRN
  Administered 2020-11-29: 10 mg via ORAL
  Filled 2020-11-28: qty 1

## 2020-11-28 MED ORDER — GUAIFENESIN-DM 100-10 MG/5ML PO SYRP
10.0000 mL | ORAL_SOLUTION | ORAL | Status: DC | PRN
  Administered 2020-11-28 – 2020-11-29 (×2): 10 mL via ORAL
  Filled 2020-11-28 (×2): qty 10

## 2020-11-28 MED ORDER — PREDNISONE 20 MG PO TABS: 50.0000 mg | ORAL_TABLET | Freq: Every day | ORAL | Status: DC

## 2020-11-28 MED ORDER — AMLODIPINE BESYLATE 10 MG PO TABS
10.0000 mg | ORAL_TABLET | Freq: Every day | ORAL | Status: DC
Start: 2020-11-28 — End: 2020-12-04
  Administered 2020-11-29 – 2020-12-04 (×6): 10 mg via ORAL
  Filled 2020-11-28 (×6): qty 1

## 2020-11-28 MED ORDER — ALBUTEROL SULFATE HFA 108 (90 BASE) MCG/ACT IN AERS
2.0000 | INHALATION_SPRAY | Freq: Four times a day (QID) | RESPIRATORY_TRACT | Status: DC
  Administered 2020-11-28: 2 via RESPIRATORY_TRACT
  Filled 2020-11-28 (×2): qty 6.7

## 2020-11-28 MED ORDER — ZINC SULFATE 220 (50 ZN) MG PO CAPS
220.0000 mg | ORAL_CAPSULE | Freq: Every day | ORAL | Status: DC
  Administered 2020-11-28 – 2020-12-04 (×7): 220 mg via ORAL
  Filled 2020-11-28 (×7): qty 1

## 2020-11-28 MED ORDER — SERTRALINE HCL 100 MG PO TABS
100.0000 mg | ORAL_TABLET | Freq: Every day | ORAL | Status: DC
  Administered 2020-11-29 – 2020-12-04 (×6): 100 mg via ORAL
  Filled 2020-11-28 (×6): qty 1

## 2020-11-28 MED ORDER — HEPARIN SODIUM (PORCINE) 5000 UNIT/ML IJ SOLN
5000.0000 [IU] | Freq: Three times a day (TID) | INTRAMUSCULAR | Status: DC
  Administered 2020-11-28 – 2020-12-04 (×19): 5000 [IU] via SUBCUTANEOUS
  Filled 2020-11-28 (×19): qty 1

## 2020-11-28 MED ORDER — RANOLAZINE ER 500 MG PO TB12
500.0000 mg | ORAL_TABLET | Freq: Two times a day (BID) | ORAL | Status: DC
  Administered 2020-11-28 – 2020-12-04 (×12): 500 mg via ORAL
  Filled 2020-11-28 (×12): qty 1

## 2020-11-28 MED ORDER — SODIUM CHLORIDE 0.9 % IV SOLN
2.0000 g | INTRAVENOUS | Status: DC
  Administered 2020-11-28: 2 g via INTRAVENOUS
  Filled 2020-11-28: qty 0.08
  Filled 2020-11-28: qty 20

## 2020-11-28 MED ORDER — ATORVASTATIN CALCIUM 80 MG PO TABS
80.0000 mg | ORAL_TABLET | Freq: Every day | ORAL | Status: DC
  Administered 2020-11-29 – 2020-12-04 (×6): 80 mg via ORAL
  Filled 2020-11-28 (×6): qty 1

## 2020-11-28 MED ORDER — ASPIRIN EC 81 MG PO TBEC
81.0000 mg | DELAYED_RELEASE_TABLET | Freq: Every day | ORAL | Status: DC
Start: 2020-11-28 — End: 2020-12-04
  Administered 2020-11-29 – 2020-12-04 (×6): 81 mg via ORAL
  Filled 2020-11-28 (×6): qty 1

## 2020-11-28 MED ORDER — SODIUM CHLORIDE 0.9% FLUSH
3.0000 mL | Freq: Two times a day (BID) | INTRAVENOUS | Status: DC
  Administered 2020-11-28 – 2020-12-04 (×13): 3 mL via INTRAVENOUS

## 2020-11-28 MED ORDER — ISOSORBIDE DINITRATE 10 MG PO TABS
20.0000 mg | ORAL_TABLET | Freq: Two times a day (BID) | ORAL | Status: DC
  Administered 2020-11-28 – 2020-12-04 (×12): 20 mg via ORAL
  Filled 2020-11-28 (×12): qty 2

## 2020-11-28 MED ORDER — ONDANSETRON HCL 4 MG PO TABS: 4.0000 mg | ORAL_TABLET | Freq: Four times a day (QID) | ORAL | Status: DC | PRN

## 2020-11-28 MED ORDER — ONDANSETRON 4 MG PO TBDP
4.0000 mg | ORAL_TABLET | Freq: Once | ORAL | Status: AC
  Administered 2020-11-28: 4 mg via ORAL
  Filled 2020-11-28: qty 1

## 2020-11-28 MED ORDER — INSULIN ASPART 100 UNIT/ML ~~LOC~~ SOLN
0.0000 [IU] | Freq: Three times a day (TID) | SUBCUTANEOUS | Status: DC
  Administered 2020-11-28: 5 [IU] via SUBCUTANEOUS
  Administered 2020-11-29: 15 [IU] via SUBCUTANEOUS

## 2020-11-28 MED ORDER — HYDROCOD POLST-CPM POLST ER 10-8 MG/5ML PO SUER: 5.0000 mL | Freq: Two times a day (BID) | ORAL | Status: DC | PRN

## 2020-11-28 MED ORDER — SCOPOLAMINE 1 MG/3DAYS TD PT72
1.0000 | MEDICATED_PATCH | TRANSDERMAL | Status: DC
  Administered 2020-11-28 – 2020-12-04 (×3): 1.5 mg via TRANSDERMAL
  Filled 2020-11-28 (×3): qty 1

## 2020-11-28 MED ORDER — ONDANSETRON HCL 4 MG/2ML IJ SOLN
4.0000 mg | Freq: Four times a day (QID) | INTRAMUSCULAR | Status: DC | PRN
  Administered 2020-11-28 – 2020-11-30 (×2): 4 mg via INTRAVENOUS
  Filled 2020-11-28 (×2): qty 2

## 2020-11-28 MED ORDER — METOPROLOL TARTRATE 12.5 MG HALF TABLET
12.5000 mg | ORAL_TABLET | Freq: Two times a day (BID) | ORAL | Status: DC
  Administered 2020-11-28: 12.5 mg via ORAL
  Filled 2020-11-28: qty 1

## 2020-11-28 MED ORDER — GLUCERNA SHAKE PO LIQD
237.0000 mL | Freq: Three times a day (TID) | ORAL | Status: DC
  Administered 2020-11-28 – 2020-12-01 (×10): 237 mL via ORAL
  Filled 2020-11-28: qty 237

## 2020-11-28 MED ORDER — CLOPIDOGREL BISULFATE 75 MG PO TABS
75.0000 mg | ORAL_TABLET | Freq: Every day | ORAL | Status: DC
  Administered 2020-11-29 – 2020-12-04 (×6): 75 mg via ORAL
  Filled 2020-11-28 (×6): qty 1

## 2020-11-28 MED ORDER — HYDROCODONE-ACETAMINOPHEN 5-325 MG PO TABS
1.0000 | ORAL_TABLET | Freq: Three times a day (TID) | ORAL | Status: DC
Start: 2020-11-28 — End: 2020-12-04
  Administered 2020-11-28 – 2020-12-04 (×18): 1 via ORAL
  Filled 2020-11-28 (×18): qty 1

## 2020-11-28 MED ORDER — SODIUM CHLORIDE 0.9 % IV SOLN
100.0000 mg | Freq: Every day | INTRAVENOUS | Status: AC
  Administered 2020-11-29 – 2020-12-02 (×4): 100 mg via INTRAVENOUS
  Filled 2020-11-28 (×4): qty 100

## 2020-11-28 MED ORDER — METHYLPREDNISOLONE SODIUM SUCC 125 MG IJ SOLR
0.5000 mg/kg | Freq: Two times a day (BID) | INTRAMUSCULAR | Status: DC
  Administered 2020-11-28 – 2020-11-30 (×4): 41.875 mg via INTRAVENOUS
  Filled 2020-11-28 (×4): qty 2

## 2020-11-28 MED ORDER — INSULIN ASPART 100 UNIT/ML ~~LOC~~ SOLN
0.0000 [IU] | Freq: Every day | SUBCUTANEOUS | Status: DC
  Administered 2020-11-28: 5 [IU] via SUBCUTANEOUS

## 2020-11-28 MED ORDER — SODIUM CHLORIDE 0.9 % IV SOLN: Freq: Once | INTRAVENOUS | Status: AC

## 2020-11-28 MED ORDER — ASCORBIC ACID 500 MG PO TABS
500.0000 mg | ORAL_TABLET | Freq: Every day | ORAL | Status: DC
  Administered 2020-11-28 – 2020-12-04 (×7): 500 mg via ORAL
  Filled 2020-11-28 (×8): qty 1

## 2020-11-28 MED ORDER — AZITHROMYCIN 500 MG PO TABS
500.0000 mg | ORAL_TABLET | Freq: Every day | ORAL | Status: DC
  Administered 2020-11-28: 500 mg via ORAL
  Filled 2020-11-28: qty 1

## 2020-11-28 MED ORDER — PREGABALIN 75 MG PO CAPS
75.0000 mg | ORAL_CAPSULE | Freq: Two times a day (BID) | ORAL | Status: DC
  Administered 2020-11-28 – 2020-12-04 (×12): 75 mg via ORAL
  Filled 2020-11-28 (×12): qty 1

## 2020-11-28 MED ORDER — FAMOTIDINE IN NACL 20-0.9 MG/50ML-% IV SOLN
20.0000 mg | Freq: Two times a day (BID) | INTRAVENOUS | Status: DC
  Administered 2020-11-28 – 2020-11-29 (×3): 20 mg via INTRAVENOUS
  Filled 2020-11-28 (×3): qty 50

## 2020-11-28 MED ORDER — BUPROPION HCL ER (XL) 300 MG PO TB24
300.0000 mg | ORAL_TABLET | Freq: Every day | ORAL | Status: DC
  Administered 2020-11-29 – 2020-12-04 (×6): 300 mg via ORAL
  Filled 2020-11-28 (×7): qty 1

## 2020-11-28 MED ORDER — ONDANSETRON HCL 4 MG/2ML IJ SOLN
4.0000 mg | Freq: Once | INTRAMUSCULAR | Status: AC
  Administered 2020-11-28: 4 mg via INTRAVENOUS
  Filled 2020-11-28: qty 2

## 2020-11-28 MED ORDER — INSULIN GLARGINE 100 UNIT/ML ~~LOC~~ SOLN
12.0000 [IU] | Freq: Every day | SUBCUTANEOUS | Status: DC
  Administered 2020-11-28 – 2020-11-29 (×2): 12 [IU] via SUBCUTANEOUS
  Filled 2020-11-28 (×2): qty 0.12

## 2020-11-28 MED ORDER — AEROCHAMBER PLUS FLO-VU LARGE MISC
Status: AC
  Filled 2020-11-28: qty 1

## 2020-11-28 MED ORDER — POLYVINYL ALCOHOL 1.4 % OP SOLN
1.0000 [drp] | OPHTHALMIC | Status: DC | PRN
  Filled 2020-11-28: qty 15

## 2020-11-28 MED ORDER — INSULIN DEGLUDEC 200 UNIT/ML ~~LOC~~ SOPN: 12.0000 [IU] | PEN_INJECTOR | Freq: Every day | SUBCUTANEOUS | Status: DC

## 2020-11-28 MED ORDER — ACETAMINOPHEN 325 MG PO TABS
650.0000 mg | ORAL_TABLET | Freq: Four times a day (QID) | ORAL | Status: DC | PRN
  Administered 2020-11-28: 650 mg via ORAL
  Filled 2020-11-28: qty 2

## 2020-11-28 MED ORDER — POLYETHYL GLYCOL-PROPYL GLYCOL 0.4-0.3 % OP GEL: 1.0000 "application " | Freq: Three times a day (TID) | OPHTHALMIC | Status: DC | PRN

## 2020-11-28 MED ORDER — LEVOTHYROXINE SODIUM 50 MCG PO TABS
50.0000 ug | ORAL_TABLET | Freq: Every day | ORAL | Status: DC
  Administered 2020-11-29 – 2020-12-04 (×6): 50 ug via ORAL
  Filled 2020-11-28 (×6): qty 1

## 2020-11-28 MED ORDER — SODIUM CHLORIDE 0.9 % IV SOLN
200.0000 mg | Freq: Once | INTRAVENOUS | Status: AC
  Administered 2020-11-28: 200 mg via INTRAVENOUS
  Filled 2020-11-28: qty 40

## 2020-11-28 MED ORDER — ACETAMINOPHEN 325 MG PO TABS
650.0000 mg | ORAL_TABLET | Freq: Once | ORAL | Status: AC
  Administered 2020-11-28: 650 mg via ORAL
  Filled 2020-11-28: qty 2

## 2020-11-28 NOTE — ED Triage Notes (Signed)
Pt comes via [facility] for SOB, n/v, fever that started yesterday after being exposed to covid. Pt is vaccinated

## 2020-11-28 NOTE — ED Notes (Signed)
Report given to Herbert Spires, Therapist, sports. Pt to be transported to floor.

## 2020-11-28 NOTE — Progress Notes (Signed)
@  approx. 2205 Dr. Abundio Miu, on-call for attending, text paged regarding pt's CBG of 404 and for clarification of fluid orders.  Page promptly returned and verbal order received to administer the maximum of pt's HS sliding scale (5 units) and to reevaluate after AM CBG. MD declined STAT lab verification of glucose. MD also endorsed to stop any maintenance fluids. Will administer insulin, stop fluids, and continue to monitor.

## 2020-11-28 NOTE — H&P (Signed)
History and Physical    Deanna Landry ELF:[id number] DOB: 09/20/1971 DOA: 11/28/2020  Referring MD/NP/PA: Elnora Morrison, MD PCP: Cher Nakai, MD  Patient coming from: Home via EMS  Chief Complaint: Cough, shortness of breath, nausea, and vomiting  I have personally briefly reviewed patient's old medical records in [facility]   HPI: Deanna Landry is a 50 y.o. female with medical history significant of HTN, HLD, diastolic CHF, COPD with supplemental oxygen at, DM with neuropathy, CKD stage III/IV, anxiety, andlegally blind presents with complaints of cough, shortness of breath, nausea, and vomiting over the last 2-3 days.  Patient had received the initial 2 doses of the Moderna vaccines back in May of last year, but had not had the booster.  The likely source was her roommates boyfriend who recently tested positive for COVID-19.  Her cough has been mostly nonproductive and she has been unable to eat anything without vomiting. Associated symptoms have included headache, generalized body aches, generalized abdominal pain, and fevers.  Denies having any diarrhea, change in taste/smell, or chest pain.  She had tried using over-the-counter medications, but just not been feeling well.  ED Course: On admission into the emergency department patient was seen to be febrile up to 103.1 F, pulse 94-125, respirations 14-26, blood pressure is maintained, and O2 saturations 94-99% on room air.  Labs significant for WBC 3.2, BUN 24, and creatinine 2.24.  COVID-19 screening was positive.  Chest x-ray revealed thickening and reticular opacities in the lungs concerning for atypical infection.  Patient has been given Zofran last night and Tylenol for fever.  TRH called to admit due to patient's comorbidities.  Review of Systems  Constitutional: Positive for fever and malaise/fatigue.  HENT: Negative for ear discharge and hearing loss.   Eyes: Negative for photophobia and discharge.  Respiratory: Positive for cough  and shortness of breath. Negative for sputum production.   Cardiovascular: Negative for chest pain and leg swelling.  Gastrointestinal: Positive for abdominal pain, nausea and vomiting. Negative for diarrhea.  Genitourinary: Negative for dysuria and hematuria.  Musculoskeletal: Positive for myalgias. Negative for falls.  Skin: Negative for rash.  Neurological: Positive for weakness and headaches. Negative for loss of consciousness.  Psychiatric/Behavioral: Negative for memory loss and substance abuse. The patient has insomnia.     Past Medical History:  Diagnosis Date  . Acute kidney injury superimposed on CKD ([place]) 03/15/2019  . Acute respiratory failure with hypoxia ([place]) 03/15/2019  . Adjustment disorder with mixed anxiety and depressed mood 03/11/2019  . Anemia   . Anxiety   . Arthritis    right ankle  . Blind    right eye  . CHF (congestive heart failure) ([place])   . Closed right pilon fracture, initial encounter 07/26/2019  . COPD (chronic obstructive pulmonary disease) ([place])   . Coronary artery disease   . Depression   . Diabetes mellitus type 2 in obese ([place]) 07/26/2019  . Diabetes mellitus without complication ([place])    type 1  . Diabetic polyneuropathy ([place]) 05/04/2016  . Dyslipidemia 01/03/2020  . Fever 03/10/2019  . GERD (gastroesophageal reflux disease)   . History of blood transfusion   . HLD (hyperlipidemia)   . Hyperglycemia   . Hypertension   . Hypothyroidism   . Ileus, postoperative ([place])   . Major depression, recurrent ([place]) 03/09/2019  . MDD (major depressive disorder), severe ([place]) 03/09/2019  . Myocardial infarct, old    2020, s/p DES RCA, RPDA 10/23/19  . Neck pain 04/21/2016  .  Pneumonia 10/2019  . Renal disorder    stage 3  . Sepsis ([place]) 03/10/2019  . Short-term memory loss    mild  . Sleep apnea    study done 9/27 and 08/13/20 - no results yet 08/21/20  . Stroke ([place])   . Suicidal ideation 03/15/2019  . Type 1 diabetes mellitus without complication ([place])  8/34/1962  . Wound infection after surgery (Right ankle) 10/04/2019    Past Surgical History:  Procedure Laterality Date  . ABDOMINAL HYSTERECTOMY  2014  . ANKLE FUSION Right 08/23/2020   Procedure: ANKLE FUSION;  Surgeon: Shona Needles, MD;  Location: [place];  Service: Orthopedics;  Laterality: Right;  . APPLICATION OF WOUND VAC Right 10/11/2019   Procedure: WOUND VAC CHANGE TO RIGHT LEG;  Surgeon: Shona Needles, MD;  Location: [place];  Service: Orthopedics;  Laterality: Right;  . CORONARY ANGIOPLASTY  10/23/2019   DES RCA, DES RPDA 10/23/19 ([place])  . EYE SURGERY    . HARDWARE REMOVAL Right 10/06/2019   Procedure: HARDWARE REMOVAL;  Surgeon: Shona Needles, MD;  Location: [place];  Service: Orthopedics;  Laterality: Right;  . HARDWARE REMOVAL Right 08/23/2020   Procedure: HARDWARE REMOVAL;  Surgeon: Shona Needles, MD;  Location: [place];  Service: Orthopedics;  Laterality: Right;  . I & D EXTREMITY Right 10/06/2019   Procedure: IRRIGATION AND DEBRIDEMENT EXTREMITY;  Surgeon: Shona Needles, MD;  Location: [place];  Service: Orthopedics;  Laterality: Right;  . I & D EXTREMITY Right 10/09/2019   Procedure: IRRIGATION AND DEBRIDEMENT EXTREMITY and WOUND VAC CHANGE RIGHT ANKLE;  Surgeon: Shona Needles, MD;  Location: [place];  Service: Orthopedics;  Laterality: Right;  . ORIF ANKLE FRACTURE Right 07/26/2019   Procedure: OPEN REDUCTION INTERNAL FIXATION (ORIF) ANKLE FRACTURE;  Surgeon: Shona Needles, MD;  Location: [place];  Service: Orthopedics;  Laterality: Right;  OPEN REDUCTION INTERNAL FIXATION (ORIF) ANKLE FRACTURE   . TOE AMPUTATION       reports that she has never smoked. She has never used smokeless tobacco. She reports previous alcohol use. She reports previous drug use.  No Known Allergies  Family History  Problem Relation Age of Onset  . Diabetes Mellitus II Mother   . Stroke Mother   . Heart attack Mother   . Stomach cancer Father   . Congestive Heart Failure Father   .  Diabetes Sister   . Congestive Heart Failure Maternal Grandfather     Prior to Admission medications   Medication Sig Start Date End Date Taking? Authorizing Provider  albuterol (VENTOLIN HFA) 108 (90 Base) MCG/ACT inhaler Inhale 1-2 puffs into the lungs every 6 (six) hours as needed for wheezing or shortness of breath.    [provider]  amLODipine (NORVASC) 10 MG tablet Take 10 mg by mouth daily. Take 1/2 tablet daily 01/15/19   [provider]  aspirin EC 81 MG tablet Take 81 mg by mouth daily.    [provider]  atorvastatin (LIPITOR) 80 MG tablet Take 80 mg by mouth daily. 12/28/18   [provider]  buPROPion (WELLBUTRIN XL) 300 MG 24 hr tablet Take 300 mg by mouth daily. 09/21/19   [provider]  clopidogrel (PLAVIX) 75 MG tablet Take 75 mg by mouth daily. 01/15/19   [provider]  Continuous Blood Gluc Receiver ([place]) DEVI Continuous glucose monitoring Patient not taking: Reported on 11/11/2020 08/14/20   Elayne Snare, MD  Continuous Blood Gluc Sensor [facility]  G6 SENSOR) MISC Apply new sensor every 10 days Patient not taking: Reported on 11/11/2020 08/14/20   Elayne Snare, MD  Continuous Blood Gluc Transmit (DEXCOM G6 TRANSMITTER) MISC Replace every 3 months Patient not taking: Reported on 11/11/2020 08/14/20   Elayne Snare, MD  Glucagon (GVOKE HYPOPEN 2-PACK) 1 MG/0.2ML SOAJ Inject 1 mg into the skin as needed. For severe lows 04/26/20   Elayne Snare, MD  glucose blood test strip Use as instructed 08/12/20   Elayne Snare, MD  HYDROcodone-acetaminophen (NORCO/VICODIN) 5-325 MG tablet  09/20/20   [provider]  Insulin Pen Needle (EASY COMFORT PEN NEEDLES) 31G X 8 MM MISC Check sugar 3x  daily 08/12/20   Elayne Snare, MD  isosorbide dinitrate (ISORDIL) 20 MG tablet Take 20 mg by mouth 2 (two) times daily. 01/27/19   [provider]  levothyroxine (SYNTHROID) 50 MCG tablet Take 50 mcg by mouth daily before  breakfast.  02/02/19   [provider]  loratadine (CLARITIN) 10 MG tablet Take 10 mg by mouth daily as needed for allergies.    [provider]  methocarbamol (ROBAXIN-750) 750 MG tablet Take 1 tablet (750 mg total) by mouth every 6 (six) hours as needed for muscle spasms. 08/23/20   Haddix, Thomasene Lot, MD  metoCLOPramide (REGLAN) 5 MG tablet Take 5 mg by mouth in the morning, at noon, and at bedtime.  03/12/20   [provider]  metoprolol tartrate (LOPRESSOR) 25 MG tablet Take 12.5 mg by mouth 2 (two) times daily. 10/25/19   [provider]  NOVOLOG 100 UNIT/ML injection Inject 2-10 Units into the skin with breakfast, with lunch, and with evening meal.  01/16/20   [provider]  oxyCODONE-acetaminophen (PERCOCET) 5-325 MG tablet Take 1 tablet by mouth every 4 (four) hours as needed for severe pain. Patient not taking: Reported on 11/11/2020 08/23/20 08/23/21  HaddixThomasene Lot, MD  pantoprazole (PROTONIX) 40 MG tablet Take 40 mg by mouth daily. 12/29/18   [provider]  Polyethyl Glycol-Propyl Glycol (SYSTANE) 0.4-0.3 % GEL ophthalmic gel Place 1 application into both eyes 3 (three) times daily as needed (dry/irritated eyes.).     [provider]  pregabalin (LYRICA) 75 MG capsule Take 75 mg by mouth 2 (two) times daily. 02/11/19   [provider]  promethazine (PHENERGAN) 25 MG tablet Take 25 mg by mouth every 6 (six) hours as needed for nausea/vomiting. Patient not taking: Reported on 11/11/2020 06/24/20   [provider]  ranolazine (RANEXA) 500 MG 12 hr tablet TAKE 1 TABLET BY MOUTH TWICE A DAY 11/01/20   Park Liter, MD  sertraline (ZOLOFT) 100 MG tablet Take 100 mg by mouth daily. 01/27/19   [provider]  TRESIBA FLEXTOUCH 200 UNIT/ML FlexTouch Pen INJECT 30 UNITS INTO THE SKIN DAILY. Patient taking differently: Inject 15 Units into the skin daily. 07/29/20   Elayne Snare, MD  TRULICITY 1.5 FX/5.8IT SOPN  Inject 1.5 mg into the skin every Monday.  02/07/19   [provider]    Physical Exam:  Constitutional: Obese female who appears to be acutely ill Vitals:   11/28/20 0900 11/28/20 0915 11/28/20 0930 11/28/20 0945  BP: 127/73 128/73 126/69 126/68  Pulse: 100 (!) 102 95 94  Resp: 19 18 19  (!) 24  Temp:      TempSrc:      SpO2: 96% 99% 94% 94%   Eyes: Blind in right eye. ENMT: Mucous membranes are moist. Posterior pharynx clear of any exudate  or lesions.Normal dentition.  Neck: normal, supple, no masses, no thyromegaly Respiratory: Tachypneic with decreased aeration but no significant wheezes or rhonchi appreciated. Cardiovascular: Regular rate and rhythm, no murmurs / rubs / gallops. No extremity edema. 2+ pedal pulses. No carotid bruits.  Abdomen: no tenderness, no masses palpated. No hepatosplenomegaly. Bowel sounds positive.  Musculoskeletal: no clubbing / cyanosis. No joint deformity upper and lower extremities. Good ROM, no contractures. Normal muscle tone.  Skin: no rashes, lesions, ulcers. No induration Neurologic: CN 2-12 grossly intact. Sensation intact, DTR normal. Strength 5/5 in all 4.  Psychiatric: Normal judgment and insight. Alert and oriented x 3.  Anxious mood.     Labs on Admission: I have personally reviewed following labs and imaging studies  CBC: Recent Labs  Lab 11/28/20 0455  WBC 3.2*  HGB 13.2  HCT 41.9  MCV 84.8  PLT 703   Basic Metabolic Panel: Recent Labs  Lab 11/28/20 0455  NA 137  K 5.0  CL 101  CO2 22  GLUCOSE 165*  BUN 24*  CREATININE 2.24*  CALCIUM 9.7   GFR: CrCl cannot be calculated (Unknown ideal weight.). Liver Function Tests: No results for input(s): AST, ALT, ALKPHOS, BILITOT, PROT, ALBUMIN in the last 168 hours. No results for input(s): LIPASE, AMYLASE in the last 168 hours. No results for input(s): AMMONIA in the last 168 hours. Coagulation Profile: No results for input(s): INR, PROTIME in the last 168  hours. Cardiac Enzymes: No results for input(s): CKTOTAL, CKMB, CKMBINDEX, TROPONINI in the last 168 hours. BNP (last 3 results) No results for input(s): PROBNP in the last 8760 hours. HbA1C: No results for input(s): HGBA1C in the last 72 hours. CBG: No results for input(s): GLUCAP in the last 168 hours. Lipid Profile: No results for input(s): CHOL, HDL, LDLCALC, TRIG, CHOLHDL, LDLDIRECT in the last 72 hours. Thyroid Function Tests: No results for input(s): TSH, T4TOTAL, FREET4, T3FREE, THYROIDAB in the last 72 hours. Anemia Panel: No results for input(s): VITAMINB12, FOLATE, FERRITIN, TIBC, IRON, RETICCTPCT in the last 72 hours. Urine analysis:    Component Value Date/Time   COLORURINE YELLOW 10/04/2019 1600   APPEARANCEUR CLEAR 10/04/2019 1600   LABSPEC 1.014 10/04/2019 1600   PHURINE 5.0 10/04/2019 1600   GLUCOSEU >=500 (A) 10/04/2019 1600   HGBUR NEGATIVE 10/04/2019 1600   BILIRUBINUR NEGATIVE 10/04/2019 1600   KETONESUR 5 (A) 10/04/2019 1600   PROTEINUR 30 (A) 10/04/2019 1600   NITRITE NEGATIVE 10/04/2019 1600   LEUKOCYTESUR NEGATIVE 10/04/2019 1600   Sepsis Labs: Recent Results (from the past 240 hour(s))  Resp Panel by RT-PCR (Flu A&B, Covid) Nasopharyngeal Swab     Status: Abnormal   Collection Time: 11/28/20  5:00 AM   Specimen: Nasopharyngeal Swab; Nasopharyngeal(NP) swabs in vial transport medium  Result Value Ref Range Status   SARS Coronavirus 2 by RT PCR POSITIVE (A) NEGATIVE Final    Comment: RESULT CALLED TO, READ BACK BY AND VERIFIED WITH: Barbaraann Boys RN 11/28/20 0601 JDW (NOTE) SARS-CoV-2 target nucleic acids are DETECTED.  The SARS-CoV-2 RNA is generally detectable in upper respiratory specimens during the acute phase of infection. Positive results are indicative of the presence of the identified virus, but do not rule out bacterial infection or co-infection with other pathogens not detected by the test. Clinical correlation with patient history  and other diagnostic information is necessary to determine patient infection status. The expected result is Negative.  Fact Sheet for Patients: EntrepreneurPulse.com.au  Fact Sheet for Healthcare Providers: IncredibleEmployment.be  This test  is not yet approved or cleared by the [place] and  has been authorized for detection and/or diagnosis of SARS-CoV-2 by FDA under an Emergency Use Authorization (EUA).  This EUA will remain in effect (meaning this test can be  used) for the duration of  the COVID-19 declaration under Section 564(b)(1) of the Act, 21 U.S.C. section 360bbb-3(b)(1), unless the authorization is terminated or revoked sooner.     Influenza A by PCR NEGATIVE NEGATIVE Final   Influenza B by PCR NEGATIVE NEGATIVE Final    Comment: (NOTE) The Xpert Xpress SARS-CoV-2/FLU/RSV plus assay is intended as an aid in the diagnosis of influenza from Nasopharyngeal swab specimens and should not be used as a sole basis for treatment. Nasal washings and aspirates are unacceptable for Xpert Xpress SARS-CoV-2/FLU/RSV testing.  Fact Sheet for Patients: EntrepreneurPulse.com.au  Fact Sheet for Healthcare Providers: IncredibleEmployment.be  This test is not yet approved or cleared by the [place] FDA and has been authorized for detection and/or diagnosis of SARS-CoV-2 by FDA under an Emergency Use Authorization (EUA). This EUA will remain in effect (meaning this test can be used) for the duration of the COVID-19 declaration under Section 564(b)(1) of the Act, 21 U.S.C. section 360bbb-3(b)(1), unless the authorization is terminated or revoked.  Performed at [facility], [place] [address], [place],  [address]      Radiological Exams on Admission: DG Chest Portable 1 View  Result Date: 11/28/2020 CLINICAL DATA:  Shortness of breath EXAM: PORTABLE CHEST 1 VIEW COMPARISON:  Radiograph  10/15/2019 FINDINGS: Some chronic bandlike opacities in both lower lungs likely reflect areas of scarring. There is mild airways thickening and some reticular interstitial opacities within the mid to lower lungs which could reflect atelectasis or early infection. No pneumothorax or effusion. The cardiomediastinal contours are unremarkable. No acute osseous or soft tissue abnormality. IMPRESSION: 1. Mild airways thickening and reticular opacities in the lungs could reflect early or atypical infection in the given clinical setting. 2. Chronic bandlike opacities in both lower lungs likely reflect areas of scarring. Electronically Signed   By: Lovena Le M.D.   On: 11/28/2020 05:12    EKG: Independently reviewed. Sinus tachycardia 124 bpm  Assessment/Plan  Pneumonia due to COVID-19: Patient presents with complaints of cough, nausea, vomiting, and generalized malaise. Found to be COVID-19 positive. Chest x-ray concerning for reticular opacities in the lung with concern for atypical infection. Patient currently maintaining O2 saturations on room air. -Admit to the medical telemetry bed -COVID-19 order set utilized -Nasal cannula oxygen as needed to maintain O2 saturation greater than 90% -Check inflammatory markers and monitor daily -Remdesivir IV -Solu-Medrol IV -Empiric antibiotics of Rocephin and azithromycin in case of underlying infection -Antitussives as needed -Vitamin C and zinc  Nausea and vomiting: Likely secondary to above. -Antiemetics as needed  Leukopenia: Acute. WBC 3.2 on admission. -Continue to monitor  Diastolic congestive heart failure:Patient appears euvolemc at this time. Last EF noted to be 60 to 65% with grade 1 diastolic dysfunction in [id number]/[id number]. -Strict intake and output -Daily weight  Essential hypertension: Blood pressures ranging from 110/75-163/74. -Continue home blood pressure regimen as tolerated  Diabetes mellitus type1: Current home regimen includes Tresiba  12 units daily, NovoLog sliding scale 2 to 10 units with meals, and Trulicity 1.5 mg every Monday. -Hypoglycemic protocol -Continue Tresiba 12 units daily -CBGs before every meal with moderate SSI -Adjust insulin regimen as needed  Chronic kidney disease stage IV: Patient presents with creatinine 2.24 with BUN  24. Baseline creatinine previously noted to be around 2.  -Continue to monitor kidney function  Hypothyroidism: Last TSH from 03/12/2019 was 1.102. -Continue levothyroxine  Hyperlipidemia -Continue atorvastatin    DVT prophylaxis: Heparin Code Status: Full Family Communication: Message left for the patient's mother over the voicemail Disposition Plan: Hopefully discharge home  Consults called: none Admission status: inpatient, r  Norval Morton MD [facility]   If 7PM-7AM, please contact night-coverage   11/28/2020, 10:24 AM

## 2020-11-28 NOTE — ED Notes (Signed)
Pt giving herself a panic attack in the waiting area, states that she wants to leave

## 2020-11-28 NOTE — ED Notes (Addendum)
Sat pt up on the side of the bed to try to ambulate, then stood up next to bed. pt started to throw up and couldn't stop coughing. O2 sats 100 entire time

## 2020-11-28 NOTE — ED Provider Notes (Signed)
[facility] EMERGENCY DEPARTMENT Provider Note   CSN: [id number] Arrival date & time: 11/28/20  0449     History Chief Complaint  Patient presents with  . Shortness of Breath    Deanna Landry is a 50 y.o. female.  Patient with complex medical history including coronary artery disease, congestive heart failure, home oxygen as needed 2 to 3 L, reflux, anemia, chronic kidney disease presents with shortness of breath, body aches, fever since yesterday.  Patient is vaccinated.  No active chest or abdominal pain.  Patient generally feels unwell.        Past Medical History:  Diagnosis Date  . Acute kidney injury superimposed on CKD ([place]) 03/15/2019  . Acute respiratory failure with hypoxia ([place]) 03/15/2019  . Adjustment disorder with mixed anxiety and depressed mood 03/11/2019  . Anemia   . Anxiety   . Arthritis    right ankle  . Blind    right eye  . CHF (congestive heart failure) ([place])   . Closed right pilon fracture, initial encounter 07/26/2019  . COPD (chronic obstructive pulmonary disease) ([place])   . Coronary artery disease   . Depression   . Diabetes mellitus type 2 in obese ([place]) 07/26/2019  . Diabetes mellitus without complication ([place])    type 1  . Diabetic polyneuropathy ([place]) 05/04/2016  . Dyslipidemia 01/03/2020  . Fever 03/10/2019  . GERD (gastroesophageal reflux disease)   . History of blood transfusion   . HLD (hyperlipidemia)   . Hyperglycemia   . Hypertension   . Hypothyroidism   . Ileus, postoperative ([place])   . Major depression, recurrent ([place]) 03/09/2019  . MDD (major depressive disorder), severe ([place]) 03/09/2019  . Myocardial infarct, old    2020, s/p DES RCA, RPDA 10/23/19  . Neck pain 04/21/2016  . Pneumonia 10/2019  . Renal disorder    stage 3  . Sepsis ([place]) 03/10/2019  . Short-term memory loss    mild  . Sleep apnea    study done 9/27 and 08/13/20 - no results yet 08/21/20  . Stroke ([place])   . Suicidal ideation 03/15/2019  . Type 1  diabetes mellitus without complication ([place]) 04/29/4314  . Wound infection after surgery (Right ankle) 10/04/2019    Patient Active Problem List   Diagnosis Date Noted  . Anemia   . Anxiety   . Arthritis   . Blind   . CHF (congestive heart failure) ([place])   . COPD (chronic obstructive pulmonary disease) ([place])   . Depression   . Diabetes mellitus without complication ([place])   . GERD (gastroesophageal reflux disease)   . History of blood transfusion   . HLD (hyperlipidemia)   . Hypothyroidism   . Myocardial infarct, old   . Renal disorder   . Short-term memory loss   . Sleep apnea   . Stroke ([place])   . Dyslipidemia 01/03/2020  . Coronary artery disease status post PTCA to 4 drug-eluting stent implanted to the right coronary artery in fall 2020 in [place] 11/02/2019  . Ileus, postoperative ([place])   . Hyperglycemia   . Wound infection after surgery (Right ankle) 10/04/2019  . Closed right pilon fracture, initial encounter 07/26/2019  . Diabetes mellitus type 2 in obese ([place]) 07/26/2019  . Hypertension 07/26/2019  . Pneumonia 03/15/2019  . Type 1 diabetes mellitus without complication ([place]) 40/06/6760  . Acute kidney injury superimposed on CKD ([place]) 03/15/2019  . Suicidal ideation 03/15/2019  . Acute respiratory failure with hypoxia ([place]) 03/15/2019  .  Adjustment disorder with mixed anxiety and depressed mood 03/11/2019  . Fever 03/10/2019  . Sepsis ([place]) 03/10/2019  . MDD (major depressive disorder), severe ([place]) 03/09/2019  . Major depression, recurrent ([place]) 03/09/2019  . Diabetic polyneuropathy ([place]) 05/04/2016  . Neck pain 04/21/2016    Past Surgical History:  Procedure Laterality Date  . ABDOMINAL HYSTERECTOMY  2014  . ANKLE FUSION Right 08/23/2020   Procedure: ANKLE FUSION;  Surgeon: Shona Needles, MD;  Location: [facility];  Service: Orthopedics;  Laterality: Right;  . APPLICATION OF WOUND VAC Right 10/11/2019   Procedure: WOUND VAC CHANGE TO RIGHT LEG;  Surgeon:  Shona Needles, MD;  Location: [facility];  Service: Orthopedics;  Laterality: Right;  . CORONARY ANGIOPLASTY  10/23/2019   DES RCA, DES RPDA 10/23/19 ([place])  . EYE SURGERY    . HARDWARE REMOVAL Right 10/06/2019   Procedure: HARDWARE REMOVAL;  Surgeon: Shona Needles, MD;  Location: [facility];  Service: Orthopedics;  Laterality: Right;  . HARDWARE REMOVAL Right 08/23/2020   Procedure: HARDWARE REMOVAL;  Surgeon: Shona Needles, MD;  Location: [facility];  Service: Orthopedics;  Laterality: Right;  . I & D EXTREMITY Right 10/06/2019   Procedure: IRRIGATION AND DEBRIDEMENT EXTREMITY;  Surgeon: Shona Needles, MD;  Location: [facility];  Service: Orthopedics;  Laterality: Right;  . I & D EXTREMITY Right 10/09/2019   Procedure: IRRIGATION AND DEBRIDEMENT EXTREMITY and WOUND VAC CHANGE RIGHT ANKLE;  Surgeon: Shona Needles, MD;  Location: [facility];  Service: Orthopedics;  Laterality: Right;  . ORIF ANKLE FRACTURE Right 07/26/2019   Procedure: OPEN REDUCTION INTERNAL FIXATION (ORIF) ANKLE FRACTURE;  Surgeon: Shona Needles, MD;  Location: [facility];  Service: Orthopedics;  Laterality: Right;  OPEN REDUCTION INTERNAL FIXATION (ORIF) ANKLE FRACTURE   . TOE AMPUTATION       OB History   No obstetric history on file.     Family History  Problem Relation Age of Onset  . Diabetes Mellitus II Mother   . Stroke Mother   . Heart attack Mother   . Stomach cancer Father   . Congestive Heart Failure Father   . Diabetes Sister   . Congestive Heart Failure Maternal Grandfather     Social History   Tobacco Use  . Smoking status: Never Smoker  . Smokeless tobacco: Never Used  Vaping Use  . Vaping Use: Never used  Substance Use Topics  . Alcohol use: Not Currently  . Drug use: Not Currently    Home Medications Prior to Admission medications   Medication Sig Start Date End Date Taking? Authorizing Provider  albuterol (VENTOLIN HFA) 108 (90 Base) MCG/ACT inhaler Inhale 1-2 puffs into the lungs every 6 (six) hours as  needed for wheezing or shortness of breath.    [provider]  amLODipine (NORVASC) 10 MG tablet Take 10 mg by mouth daily. Take 1/2 tablet daily 01/15/19   [provider]  aspirin EC 81 MG tablet Take 81 mg by mouth daily.    [provider]  atorvastatin (LIPITOR) 80 MG tablet Take 80 mg by mouth daily. 12/28/18   [provider]  buPROPion (WELLBUTRIN XL) 300 MG 24 hr tablet Take 300 mg by mouth daily. 09/21/19   [provider]  clopidogrel (PLAVIX) 75 MG tablet Take 75 mg by mouth daily. 01/15/19   [provider]  Continuous Blood Gluc Receiver ([place]) DEVI Continuous glucose monitoring Patient not taking: Reported on 11/11/2020 08/14/20   Dwyane Dee,  Vicenta Aly, MD  Continuous Blood Gluc Sensor (DEXCOM G6 SENSOR) MISC Apply new sensor every 10 days Patient not taking: Reported on 11/11/2020 08/14/20   Elayne Snare, MD  Continuous Blood Gluc Transmit (DEXCOM G6 TRANSMITTER) MISC Replace every 3 months Patient not taking: Reported on 11/11/2020 08/14/20   Elayne Snare, MD  Glucagon (GVOKE HYPOPEN 2-PACK) 1 MG/0.2ML SOAJ Inject 1 mg into the skin as needed. For severe lows 04/26/20   Elayne Snare, MD  glucose blood test strip Use as instructed 08/12/20   Elayne Snare, MD  HYDROcodone-acetaminophen (NORCO/VICODIN) 5-325 MG tablet  09/20/20   [provider]  Insulin Pen Needle (EASY COMFORT PEN NEEDLES) 31G X 8 MM MISC Check sugar 3x  daily 08/12/20   Elayne Snare, MD  isosorbide dinitrate (ISORDIL) 20 MG tablet Take 20 mg by mouth 2 (two) times daily. 01/27/19   [provider]  levothyroxine (SYNTHROID) 50 MCG tablet Take 50 mcg by mouth daily before breakfast.  02/02/19   [provider]  loratadine (CLARITIN) 10 MG tablet Take 10 mg by mouth daily as needed for allergies.    [provider]  methocarbamol (ROBAXIN-750) 750 MG tablet Take 1 tablet (750 mg total) by mouth every 6 (six) hours as needed for muscle spasms.  08/23/20   Haddix, Thomasene Lot, MD  metoCLOPramide (REGLAN) 5 MG tablet Take 5 mg by mouth in the morning, at noon, and at bedtime.  03/12/20   [provider]  metoprolol tartrate (LOPRESSOR) 25 MG tablet Take 12.5 mg by mouth 2 (two) times daily. 10/25/19   [provider]  NOVOLOG 100 UNIT/ML injection Inject 2-10 Units into the skin with breakfast, with lunch, and with evening meal.  01/16/20   [provider]  oxyCODONE-acetaminophen (PERCOCET) 5-325 MG tablet Take 1 tablet by mouth every 4 (four) hours as needed for severe pain. Patient not taking: Reported on 11/11/2020 08/23/20 08/23/21  HaddixThomasene Lot, MD  pantoprazole (PROTONIX) 40 MG tablet Take 40 mg by mouth daily. 12/29/18   [provider]  Polyethyl Glycol-Propyl Glycol (SYSTANE) 0.4-0.3 % GEL ophthalmic gel Place 1 application into both eyes 3 (three) times daily as needed (dry/irritated eyes.).     [provider]  pregabalin (LYRICA) 75 MG capsule Take 75 mg by mouth 2 (two) times daily. 02/11/19   [provider]  promethazine (PHENERGAN) 25 MG tablet Take 25 mg by mouth every 6 (six) hours as needed for nausea/vomiting. Patient not taking: Reported on 11/11/2020 06/24/20   [provider]  ranolazine (RANEXA) 500 MG 12 hr tablet TAKE 1 TABLET BY MOUTH TWICE A DAY 11/01/20   Park Liter, MD  sertraline (ZOLOFT) 100 MG tablet Take 100 mg by mouth daily. 01/27/19   [provider]  TRESIBA FLEXTOUCH 200 UNIT/ML FlexTouch Pen INJECT 30 UNITS INTO THE SKIN DAILY. Patient taking differently: Inject 15 Units into the skin daily. 07/29/20   Elayne Snare, MD  TRULICITY 1.5 QJ/1.9ER SOPN Inject 1.5 mg into the skin every Monday.  02/07/19   [provider]    Allergies    Patient has no known allergies.  Review of Systems   Review of Systems  Constitutional: Positive for chills and fever.  HENT: Positive for congestion.   Eyes: Negative for visual disturbance.   Respiratory: Positive for cough and shortness of breath.   Cardiovascular: Negative for chest pain.  Gastrointestinal: Positive for nausea and vomiting. Negative for abdominal pain.  Genitourinary: Negative for dysuria and flank  pain.  Musculoskeletal: Negative for back pain, neck pain and neck stiffness.  Skin: Negative for rash.  Neurological: Positive for weakness. Negative for light-headedness and headaches.    Physical Exam Updated Vital Signs BP 126/68   Pulse 94   Temp 100 F (37.8 C) (Oral)   Resp (!) 24   LMP  (LMP Unknown) Comment: hysterectomy 2014  SpO2 94%   Physical Exam Vitals and nursing note reviewed.  Constitutional:      Appearance: She is well-developed and well-nourished.  HENT:     Head: Normocephalic and atraumatic.     Comments: Dry mm Eyes:     General:        Right eye: No discharge.        Left eye: No discharge.     Conjunctiva/sclera: Conjunctivae normal.  Neck:     Trachea: No tracheal deviation.  Cardiovascular:     Rate and Rhythm: Regular rhythm. Tachycardia present.  Pulmonary:     Effort: Pulmonary effort is normal.     Breath sounds: Examination of the right-lower field reveals rales. Examination of the left-lower field reveals rales. Rales present.  Abdominal:     General: There is no distension.     Palpations: Abdomen is soft.     Tenderness: There is no abdominal tenderness. There is no guarding.  Musculoskeletal:        General: No edema. Normal range of motion.     Cervical back: Normal range of motion and neck supple.     Right lower leg: No edema.     Left lower leg: No edema.  Skin:    General: Skin is warm.     Findings: No rash.  Neurological:     Mental Status: She is alert and oriented to person, place, and time.  Psychiatric:        Mood and Affect: Mood and affect normal.     ED Results / Procedures / Treatments   Labs (all labs ordered are listed, but only abnormal results are displayed) Labs Reviewed   RESP PANEL BY RT-PCR (FLU A&B, COVID) ARPGX2 - Abnormal; Notable for the following components:      Result Value   SARS Coronavirus 2 by RT PCR POSITIVE (*)    All other components within normal limits  BASIC METABOLIC PANEL - Abnormal; Notable for the following components:   Glucose, Bld 165 (*)    BUN 24 (*)    Creatinine, Ser 2.24 (*)    GFR, Estimated 26 (*)    All other components within normal limits  CBC - Abnormal; Notable for the following components:   WBC 3.2 (*)    All other components within normal limits    EKG EKG Interpretation  Date/Time:  Thursday November 28 2020 04:55:08 EST Ventricular Rate:  124 PR Interval:  142 QRS Duration: 70 QT Interval:  310 QTC Calculation: 445 R Axis:   21 Text Interpretation: Sinus tachycardia Nonspecific ST abnormality Abnormal ECG When compared with ECG of 10/10/2019, Nonspecific ST abnormality is now present Confirmed by Delora Fuel ([id number]) on 11/28/2020 5:00:49 AM   Radiology DG Chest Portable 1 View  Result Date: 11/28/2020 CLINICAL DATA:  Shortness of breath EXAM: PORTABLE CHEST 1 VIEW COMPARISON:  Radiograph 10/15/2019 FINDINGS: Some chronic bandlike opacities in both lower lungs likely reflect areas of scarring. There is mild airways thickening and some reticular interstitial opacities within the mid to lower lungs which could reflect atelectasis or early infection. No pneumothorax or effusion.  The cardiomediastinal contours are unremarkable. No acute osseous or soft tissue abnormality. IMPRESSION: 1. Mild airways thickening and reticular opacities in the lungs could reflect early or atypical infection in the given clinical setting. 2. Chronic bandlike opacities in both lower lungs likely reflect areas of scarring. Electronically Signed   By: Lovena Le M.D.   On: 11/28/2020 05:12    Procedures Procedures (including critical care time)  Medications Ordered in ED Medications  ondansetron (ZOFRAN) injection 4 mg (has no  administration in time range)  ondansetron (ZOFRAN-ODT) disintegrating tablet 4 mg (4 mg Oral Given 11/28/20 0502)  acetaminophen (TYLENOL) tablet 650 mg (650 mg Oral Given 11/28/20 0502)    ED Course  I have reviewed the triage vital signs and the nursing notes.  Pertinent labs & imaging results that were available during my care of the patient were reviewed by me and considered in my medical decision making (see chart for details).    MDM Rules/Calculators/A&P                          Patient with complicated medical history presents with clinical concern for COVID or other viral syndrome with body aches and fevers. Vital signs initially tachycardic 120s, febrile 103.1, this improved with antipyretics. Antiemetics given, repeat antiemetics as patient still nauseated and oral fluid challenge to be performed. Blood work reviewed showing mild leukopenia noted viral process 3.2, chronic kidney disease creatinine 2.2. Patient feels generally weak and not tolerating oral liquids in addition to multiple medical problems will discuss with hospitalist for possible observation.  Patient failed oral fluid challenge, vomited in the room.  Patient became very dyspneic with walking in the room however did not drop oxygen saturations below 90. Discussed with hospitalist for observation for further treatment of her vomiting and shortness of breath.  Deanna Landry was evaluated in Emergency Department on 11/28/2020 for the symptoms described in the history of present illness. She was evaluated in the context of the global COVID-19 pandemic, which necessitated consideration that the patient might be at risk for infection with the SARS-CoV-2 virus that causes COVID-19. Institutional protocols and algorithms that pertain to the evaluation of patients at risk for COVID-19 are in a state of rapid change based on information released by regulatory bodies including the CDC and federal and state organizations. These  policies and algorithms were followed during the patient's care in the ED.    Final Clinical Impression(s) / ED Diagnoses Final diagnoses:  Pneumonia due to COVID-19 virus  Dyspnea, unspecified type  Fever in adult  Vomiting in adult    Rx / DC Orders ED Discharge Orders    None       Elnora Morrison, MD 11/28/20 1026

## 2020-11-28 NOTE — ED Notes (Signed)
Attempted to call report. Inpt RN unavailable at this time. Gave callback number for inpt RN to call when available.

## 2020-11-28 NOTE — ED Notes (Signed)
Lunch Tray Ordered @ 1022. 

## 2020-11-29 DIAGNOSIS — U071 COVID-19: Secondary | ICD-10-CM | POA: Diagnosis present

## 2020-11-29 DIAGNOSIS — I2510 Atherosclerotic heart disease of native coronary artery without angina pectoris: Secondary | ICD-10-CM | POA: Diagnosis present

## 2020-11-29 DIAGNOSIS — E039 Hypothyroidism, unspecified: Secondary | ICD-10-CM | POA: Diagnosis present

## 2020-11-29 DIAGNOSIS — Z79890 Hormone replacement therapy (postmenopausal): Secondary | ICD-10-CM | POA: Diagnosis not present

## 2020-11-29 DIAGNOSIS — T380X5A Adverse effect of glucocorticoids and synthetic analogues, initial encounter: Secondary | ICD-10-CM | POA: Diagnosis not present

## 2020-11-29 DIAGNOSIS — I130 Hypertensive heart and chronic kidney disease with heart failure and stage 1 through stage 4 chronic kidney disease, or unspecified chronic kidney disease: Secondary | ICD-10-CM | POA: Diagnosis present

## 2020-11-29 DIAGNOSIS — J1282 Pneumonia due to coronavirus disease 2019: Secondary | ICD-10-CM | POA: Diagnosis present

## 2020-11-29 DIAGNOSIS — K5900 Constipation, unspecified: Secondary | ICD-10-CM | POA: Diagnosis present

## 2020-11-29 DIAGNOSIS — Z6833 Body mass index (BMI) 33.0-33.9, adult: Secondary | ICD-10-CM | POA: Diagnosis not present

## 2020-11-29 DIAGNOSIS — D72819 Decreased white blood cell count, unspecified: Secondary | ICD-10-CM | POA: Diagnosis present

## 2020-11-29 DIAGNOSIS — E1042 Type 1 diabetes mellitus with diabetic polyneuropathy: Secondary | ICD-10-CM | POA: Diagnosis present

## 2020-11-29 DIAGNOSIS — E669 Obesity, unspecified: Secondary | ICD-10-CM | POA: Diagnosis present

## 2020-11-29 DIAGNOSIS — J440 Chronic obstructive pulmonary disease with acute lower respiratory infection: Secondary | ICD-10-CM | POA: Diagnosis present

## 2020-11-29 DIAGNOSIS — E1022 Type 1 diabetes mellitus with diabetic chronic kidney disease: Secondary | ICD-10-CM | POA: Diagnosis present

## 2020-11-29 DIAGNOSIS — Z7982 Long term (current) use of aspirin: Secondary | ICD-10-CM | POA: Diagnosis not present

## 2020-11-29 DIAGNOSIS — Z7902 Long term (current) use of antithrombotics/antiplatelets: Secondary | ICD-10-CM | POA: Diagnosis not present

## 2020-11-29 DIAGNOSIS — E1043 Type 1 diabetes mellitus with diabetic autonomic (poly)neuropathy: Secondary | ICD-10-CM | POA: Diagnosis present

## 2020-11-29 DIAGNOSIS — I5032 Chronic diastolic (congestive) heart failure: Secondary | ICD-10-CM | POA: Diagnosis present

## 2020-11-29 DIAGNOSIS — Z981 Arthrodesis status: Secondary | ICD-10-CM | POA: Diagnosis not present

## 2020-11-29 DIAGNOSIS — E1065 Type 1 diabetes mellitus with hyperglycemia: Secondary | ICD-10-CM | POA: Diagnosis present

## 2020-11-29 DIAGNOSIS — F339 Major depressive disorder, recurrent, unspecified: Secondary | ICD-10-CM | POA: Diagnosis present

## 2020-11-29 DIAGNOSIS — Z79899 Other long term (current) drug therapy: Secondary | ICD-10-CM | POA: Diagnosis not present

## 2020-11-29 DIAGNOSIS — K3184 Gastroparesis: Secondary | ICD-10-CM | POA: Diagnosis present

## 2020-11-29 DIAGNOSIS — N184 Chronic kidney disease, stage 4 (severe): Secondary | ICD-10-CM | POA: Diagnosis present

## 2020-11-29 DIAGNOSIS — R1110 Vomiting, unspecified: Secondary | ICD-10-CM | POA: Diagnosis present

## 2020-11-29 LAB — COMPREHENSIVE METABOLIC PANEL
ALT: 12 U/L (ref 0–44)
AST: 15 U/L (ref 15–41)
Albumin: 3 g/dL — ABNORMAL LOW (ref 3.5–5.0)
Alkaline Phosphatase: 114 U/L (ref 38–126)
Anion gap: 12 (ref 5–15)
BUN: 37 mg/dL — ABNORMAL HIGH (ref 6–20)
CO2: 20 mmol/L — ABNORMAL LOW (ref 22–32)
Calcium: 8.2 mg/dL — ABNORMAL LOW (ref 8.9–10.3)
Chloride: 102 mmol/L (ref 98–111)
Creatinine, Ser: 2.46 mg/dL — ABNORMAL HIGH (ref 0.44–1.00)
GFR, Estimated: 24 mL/min — ABNORMAL LOW (ref >60)
Glucose, Bld: 470 mg/dL — ABNORMAL HIGH (ref 70–99)
Potassium: 5.2 mmol/L — ABNORMAL HIGH (ref 3.5–5.1)
Sodium: 134 mmol/L — ABNORMAL LOW (ref 135–145)
Total Bilirubin: 0.6 mg/dL (ref 0.3–1.2)
Total Protein: 6.2 g/dL — ABNORMAL LOW (ref 6.5–8.1)

## 2020-11-29 LAB — GLUCOSE, CAPILLARY
Glucose-Capillary: 489 mg/dL — ABNORMAL HIGH (ref 70–99)
Glucose-Capillary: 458 mg/dL — ABNORMAL HIGH (ref 70–99)
Glucose-Capillary: 446 mg/dL — ABNORMAL HIGH (ref 70–99)
Glucose-Capillary: 431 mg/dL — ABNORMAL HIGH (ref 70–99)
Glucose-Capillary: 274 mg/dL — ABNORMAL HIGH (ref 70–99)
Glucose-Capillary: 197 mg/dL — ABNORMAL HIGH (ref 70–99)

## 2020-11-29 LAB — C-REACTIVE PROTEIN: CRP: 2.7 mg/dL — ABNORMAL HIGH (ref <1.0)

## 2020-11-29 LAB — PHOSPHORUS: Phosphorus: 4.2 mg/dL (ref 2.5–4.6)

## 2020-11-29 LAB — CBC WITH DIFFERENTIAL/PLATELET
Abs Immature Granulocytes: 0 10*3/uL (ref 0.00–0.07)
Basophils Absolute: 0 10*3/uL (ref 0.0–0.1)
Basophils Relative: 0 %
Eosinophils Absolute: 0 10*3/uL (ref 0.0–0.5)
Eosinophils Relative: 0 %
HCT: 32.3 % — ABNORMAL LOW (ref 36.0–46.0)
Hemoglobin: 10.5 g/dL — ABNORMAL LOW (ref 12.0–15.0)
Immature Granulocytes: 0 %
Lymphocytes Relative: 18 %
Lymphs Abs: 0.3 10*3/uL — ABNORMAL LOW (ref 0.7–4.0)
MCH: 27.9 pg (ref 26.0–34.0)
MCHC: 32.5 g/dL (ref 30.0–36.0)
MCV: 85.7 fL (ref 80.0–100.0)
Monocytes Absolute: 0.1 10*3/uL (ref 0.1–1.0)
Monocytes Relative: 3 %
Neutro Abs: 1.4 10*3/uL — ABNORMAL LOW (ref 1.7–7.7)
Neutrophils Relative %: 79 %
Platelets: 134 10*3/uL — ABNORMAL LOW (ref 150–400)
RBC: 3.77 MIL/uL — ABNORMAL LOW (ref 3.87–5.11)
RDW: 14 % (ref 11.5–15.5)
WBC: 1.8 10*3/uL — ABNORMAL LOW (ref 4.0–10.5)
nRBC: 0 % (ref 0.0–0.2)

## 2020-11-29 LAB — FERRITIN: Ferritin: 31 ng/mL (ref 11–307)

## 2020-11-29 LAB — D-DIMER, QUANTITATIVE: D-Dimer, Quant: 0.38 ug/mL-FEU (ref 0.00–0.50)

## 2020-11-29 LAB — MAGNESIUM: Magnesium: 2 mg/dL (ref 1.7–2.4)

## 2020-11-29 MED ORDER — INSULIN ASPART 100 UNIT/ML ~~LOC~~ SOLN
20.0000 [IU] | Freq: Once | SUBCUTANEOUS | Status: AC
  Administered 2020-11-29: 20 [IU] via SUBCUTANEOUS

## 2020-11-29 MED ORDER — INSULIN REGULAR(HUMAN) IN NACL 100-0.9 UT/100ML-% IV SOLN: INTRAVENOUS | Status: DC

## 2020-11-29 MED ORDER — ALBUTEROL SULFATE HFA 108 (90 BASE) MCG/ACT IN AERS
2.0000 | INHALATION_SPRAY | Freq: Two times a day (BID) | RESPIRATORY_TRACT | Status: DC
  Administered 2020-11-29 – 2020-12-01 (×4): 2 via RESPIRATORY_TRACT
  Filled 2020-11-29: qty 6.7

## 2020-11-29 MED ORDER — LINAGLIPTIN 5 MG PO TABS
5.0000 mg | ORAL_TABLET | Freq: Every day | ORAL | Status: DC
  Administered 2020-11-29 – 2020-12-04 (×6): 5 mg via ORAL
  Filled 2020-11-29 (×6): qty 1

## 2020-11-29 MED ORDER — INSULIN GLARGINE 100 UNIT/ML ~~LOC~~ SOLN
20.0000 [IU] | Freq: Every day | SUBCUTANEOUS | Status: DC
  Administered 2020-11-29: 8 [IU] via SUBCUTANEOUS
  Filled 2020-11-29: qty 0.2

## 2020-11-29 MED ORDER — DEXTROSE 50 % IV SOLN
0.0000 mL | INTRAVENOUS | Status: DC | PRN
Start: 2020-11-29 — End: 2020-11-29

## 2020-11-29 MED ORDER — INSULIN ASPART 100 UNIT/ML ~~LOC~~ SOLN
0.0000 [IU] | Freq: Three times a day (TID) | SUBCUTANEOUS | Status: DC
  Administered 2020-11-29: 11 [IU] via SUBCUTANEOUS
  Administered 2020-11-30 (×2): 20 [IU] via SUBCUTANEOUS

## 2020-11-29 MED ORDER — INSULIN ASPART 100 UNIT/ML ~~LOC~~ SOLN: 0.0000 [IU] | Freq: Every day | SUBCUTANEOUS | Status: DC

## 2020-11-29 MED ORDER — INSULIN ASPART 100 UNIT/ML ~~LOC~~ SOLN
6.0000 [IU] | Freq: Three times a day (TID) | SUBCUTANEOUS | Status: DC
  Administered 2020-11-29 – 2020-11-30 (×3): 6 [IU] via SUBCUTANEOUS

## 2020-11-29 MED ORDER — INSULIN DETEMIR 100 UNIT/ML ~~LOC~~ SOLN
15.0000 [IU] | Freq: Two times a day (BID) | SUBCUTANEOUS | Status: DC
  Administered 2020-11-29: 15 [IU] via SUBCUTANEOUS
  Filled 2020-11-29 (×3): qty 0.15

## 2020-11-29 MED ORDER — INSULIN ASPART 100 UNIT/ML ~~LOC~~ SOLN
8.0000 [IU] | Freq: Three times a day (TID) | SUBCUTANEOUS | Status: DC
  Administered 2020-11-29: 8 [IU] via SUBCUTANEOUS

## 2020-11-29 MED ORDER — FAMOTIDINE IN NACL 20-0.9 MG/50ML-% IV SOLN
20.0000 mg | INTRAVENOUS | Status: DC
  Administered 2020-11-30 – 2020-12-01 (×2): 20 mg via INTRAVENOUS
  Filled 2020-11-29 (×2): qty 50

## 2020-11-29 MED ORDER — ALBUTEROL SULFATE HFA 108 (90 BASE) MCG/ACT IN AERS
2.0000 | INHALATION_SPRAY | Freq: Four times a day (QID) | RESPIRATORY_TRACT | Status: DC | PRN
  Filled 2020-11-29: qty 6.7

## 2020-11-29 MED ORDER — LACTATED RINGERS IV SOLN: INTRAVENOUS | Status: DC

## 2020-11-29 NOTE — Progress Notes (Signed)
Inpatient Diabetes Program Recommendations  AACE/ADA: New Consensus Statement on Inpatient Glycemic Control (2015)  Target Ranges:  Prepandial:   less than 140 mg/dL      Peak postprandial:   less than 180 mg/dL (1-2 hours)      Critically ill patients:  140 - 180 mg/dL   Lab Results  Component Value Date   GLUCAP 431 (H) 11/29/2020   HGBA1C 6.6 (A) 11/11/2020    Review of Glycemic Control Results for LASHAWNDRA, LAMPKINS (MRN [id number]) as of 11/29/2020 13:57  Ref. Range 11/29/2020 06:15 11/29/2020 08:24 11/29/2020 11:12 11/29/2020 13:20  Glucose-Capillary Latest Ref Range: 70 - 99 mg/dL 489 (H) 458 (H) 446 (H) 431 (H)   Diabetes history: Type 1 DM, followed by Dr Dwyane Dee- outpatient endocrinology Outpatient Diabetes medications: Tresiba 12 units qhs, Trulicity 1.5 mg qmon, Novolog 2-10 units TID Current orders for Inpatient glycemic control: Lantus 20 units QD, Novolog 8 units TID, Novolog 0-15 units TID, Novolog 0-5 units QHS, Novolog 20 units x 1 Solumedrol 41.875 mg BID  Inpatient Diabetes Program Recommendations:    Noted insulin changes this AM and current glucose trends. In setting of steroids, Covid and patient being type 1 would recommend starting IV insulin to safely determine insulin needs.  Secure chat sent to MD.   Thanks, Bronson Curb, MSN, RNC-OB Diabetes Coordinator [phone] (8a-5p)

## 2020-11-29 NOTE — Progress Notes (Signed)
Dear Doctor: Deanna Landry, This patient has been identified as a candidate for a PICC or other CVC for the following reason (s): multiple incompatible drug infusions. Has 3 PIV at this time. If you agree, please write an order for the indicated device.   Thank you for supporting the early vascular access assessment program.

## 2020-11-29 NOTE — Progress Notes (Signed)
[facility] Progress Note  Patient: Deanna Landry    QAS:[id number]  DOA: 11/28/2020     Date of Service: the patient was seen and examined on 11/29/2020  Brief hospital course: Past medical history of  HTN, HLD, diastolic CHF, COPD with supplemental oxygen at, DM with neuropathy, CKD stage III/IV, anxiety, and legally blind  presents with complaints of cough and shortness of breath.  Found to have COVID-19 pneumonia.  Also has severe hyperglycemia. Currently plan is continue current care.  Assessment and Plan: 1. Acute COVID-19 Viral Pneumonia CXR: hazy bilateral peripheral opacities Oxygen requirement: On room air right now CRP: 2.7 Remdesivir: On remdesivir started on 1/13 Steroids: On IV Solu-Medrol, rapid taper to prednisone Baricitinib/Actemra: Not indicated for now Antibiotics: Initially given IV ceftriaxone and azithromycin currently on hold as there is no other evidence of infection DVT Prophylaxis: heparin injection 5,000 Units Start: 11/28/20 1400  Prone positioning and incentive spirometer use recommended.  Overall plan: Monitor for improvement in fever curve and symptomatology  The treatment plan and use of medications and known side effects were discussed with patient/family. It was clearly explained that complete risks and long-term side effects are unknown. Patient/family agree with the treatment plan.    2.  Type 1 diabetes mellitus, uncontrolled with hyperglycemia with as well as CKD and neuropathy Currently severely hyperglycemic with blood sugars in 400s. Before the patient was started on IV insulin drip which was ordered for response to hypoglycemia her sugars were better controlled and therefore currently holding off on IV insulin. Continue with basal bolus regimen.  3.  HTN Blood pressure stable.  Continue current regimen  4.  Chronic diastolic CHF Currently euvolemic. Monitor.  5.  CKD stage IV Renal function stable. No acute abnormality.  6.   Hypothyroidism Continue Synthroid.  7. obesity Placing the patient at high risk for poor outcome from COVID-19 pneumonia Body mass index is 33.47 kg/m.   Diet: Carb modified diet DVT Prophylaxis:   heparin injection 5,000 Units Start: 11/28/20 1400    Advance goals of care discussion: Full code  Family Communication: no family was present at bedside, at the time of interview.  Unable to reach mother on the phone.  Left voicemail.  Disposition:  Status is: Inpatient  Remains inpatient appropriate because:IV treatments appropriate due to intensity of illness or inability to take PO   Dispo: The patient is from: Home              Anticipated d/c is to: Home              Anticipated d/c date is: 3 days              Patient currently is not medically stable to d/c.  Subjective: No nausea or vomiting.  No fever no chills.  Continues to have cough and continues to have shortness of breath.  No diarrhea.  Physical Exam:  General: Appear in mild distress, no Rash; Oral Mucosa Clear, moist. no Abnormal Neck Mass Or lumps, Conjunctiva normal  Cardiovascular: S1 and S2 Present, no Murmur, Respiratory: increased respiratory effort, Bilateral Air entry present and bilateral  Crackles, no wheezes Abdomen: Bowel Sound present, Soft and no tenderness Extremities: trace Pedal edema Neurology: alert and oriented to time, place, and person affect appropriate. no new focal deficit Gait not checked due to patient safety concerns    Vitals:   11/29/20 0500 11/29/20 0828 11/29/20 1117 11/29/20 1631  BP: 127/74 (!) 152/61 (!) 111/50 140/77  Pulse: 71 (!) 54 61 67  Resp: 16 18 18 18   Temp: 98.3 F (36.8 C) 97.7 F (36.5 C) 97.8 F (36.6 C) 98.2 F (36.8 C)  TempSrc: Oral Oral Oral Oral  SpO2: 97% 95% 94% 96%  Weight: 83 kg     Height:        Intake/Output Summary (Last 24 hours) at 11/29/2020 1940 Last data filed at 11/29/2020 0900 Gross per 24 hour  Intake 1110 ml  Output 600 ml   Net 510 ml   Filed Weights   11/28/20 1231 11/29/20 0500  Weight: 83.5 kg 83 kg    Data Reviewed: I have personally reviewed and interpreted daily labs, tele strips, imaging. I reviewed all nursing notes, pharmacy notes, vitals, pertinent old records I have discussed plan of care as described above with RN and patient/family.  CBC: Recent Labs  Lab 11/28/20 0455 11/28/20 2349  WBC 3.2* 1.8*  NEUTROABS  --  1.4*  HGB 13.2 10.5*  HCT 41.9 32.3*  MCV 84.8 85.7  PLT 180 413*   Basic Metabolic Panel: Recent Labs  Lab 11/28/20 0455 11/28/20 2349  NA 137 134*  K 5.0 5.2*  CL 101 102  CO2 22 20*  GLUCOSE 165* 470*  BUN 24* 37*  CREATININE 2.24* 2.46*  CALCIUM 9.7 8.2*  MG  --  2.0  PHOS  --  4.2    Studies: No results found.  Scheduled Meds: . albuterol  2 puff Inhalation BID  . amLODipine  10 mg Oral Daily  . vitamin C  500 mg Oral Daily  . aspirin EC  81 mg Oral Daily  . atorvastatin  80 mg Oral Daily  . buPROPion  300 mg Oral Daily  . clopidogrel  75 mg Oral Daily  . feeding supplement (GLUCERNA SHAKE)  237 mL Oral TID BM  . heparin  5,000 Units Subcutaneous Q8H  . HYDROcodone-acetaminophen  1 tablet Oral Q8H  . insulin aspart  0-20 Units Subcutaneous TID WC  . insulin aspart  0-5 Units Subcutaneous QHS  . insulin aspart  6 Units Subcutaneous TID WC  . insulin detemir  15 Units Subcutaneous BID  . isosorbide dinitrate  20 mg Oral BID  . levothyroxine  50 mcg Oral QAC breakfast  . linagliptin  5 mg Oral Daily  . methylPREDNISolone (SOLU-MEDROL) injection  0.5 mg/kg Intravenous Q12H   Followed by  . [START ON 12/01/2020] predniSONE  50 mg Oral Daily  . pregabalin  75 mg Oral BID  . ranolazine  500 mg Oral BID  . scopolamine  1 patch Transdermal Q72H  . sertraline  100 mg Oral Daily  . sodium chloride flush  3 mL Intravenous Q12H  . zinc sulfate  220 mg Oral Daily   Continuous Infusions: . [START ON 11/30/2020] famotidine (PEPCID) IV    . remdesivir  100 mg in NS 100 mL 100 mg (11/29/20 1153)   PRN Meds: acetaminophen, albuterol, chlorpheniramine-HYDROcodone, guaiFENesin-dextromethorphan, loratadine, ondansetron **OR** ondansetron (ZOFRAN) IV, polyvinyl alcohol  Time spent: 35 minutes  Author: Berle Mull, MD [facility] 11/29/2020 7:40 PM  To reach On-call, see care teams to locate the attending and reach out via [email or web address]. Between 7PM-7AM, please contact night-coverage If you still have difficulty reaching the attending provider, please page the [facility] (Director on Call) for [facility] on amion for assistance.

## 2020-11-29 NOTE — Progress Notes (Signed)
[facility] night shift telemetry coverage note.  The staff reports patient has a recent CBG of 489 mg/dL.  NovoLog 20 units SQ x1 dose ordered.  Check CBG 1 hour after administration.  Tennis Must, MD.

## 2020-11-29 NOTE — Progress Notes (Signed)
@  0630, Dr. Abundio Miu, on-call for attending, text paged regarding pt's CBG of 489. Page promptly returned and order received to administer 20 Units of Novolog in place of scheduled sliding scale coverage and to recheck 1 hour post administration. Medication given and Day Shift updated about necessity of recheck.

## 2020-11-30 LAB — HEMOGLOBIN A1C
Hgb A1c MFr Bld: 6.9 % — ABNORMAL HIGH (ref 4.8–5.6)
Mean Plasma Glucose: 151.33 mg/dL

## 2020-11-30 LAB — COMPREHENSIVE METABOLIC PANEL
ALT: 10 U/L (ref 0–44)
AST: 17 U/L (ref 15–41)
Albumin: 3.1 g/dL — ABNORMAL LOW (ref 3.5–5.0)
Alkaline Phosphatase: 117 U/L (ref 38–126)
Anion gap: 10 (ref 5–15)
BUN: 47 mg/dL — ABNORMAL HIGH (ref 6–20)
CO2: 22 mmol/L (ref 22–32)
Calcium: 8.6 mg/dL — ABNORMAL LOW (ref 8.9–10.3)
Chloride: 100 mmol/L (ref 98–111)
Creatinine, Ser: 2.67 mg/dL — ABNORMAL HIGH (ref 0.44–1.00)
GFR, Estimated: 21 mL/min — ABNORMAL LOW (ref >60)
Glucose, Bld: 396 mg/dL — ABNORMAL HIGH (ref 70–99)
Potassium: 5.9 mmol/L — ABNORMAL HIGH (ref 3.5–5.1)
Sodium: 132 mmol/L — ABNORMAL LOW (ref 135–145)
Total Bilirubin: 0.4 mg/dL (ref 0.3–1.2)
Total Protein: 6.5 g/dL (ref 6.5–8.1)

## 2020-11-30 LAB — CBC WITH DIFFERENTIAL/PLATELET
Abs Immature Granulocytes: 0.02 10*3/uL (ref 0.00–0.07)
Basophils Absolute: 0 10*3/uL (ref 0.0–0.1)
Basophils Relative: 0 %
Eosinophils Absolute: 0 10*3/uL (ref 0.0–0.5)
Eosinophils Relative: 0 %
HCT: 34.9 % — ABNORMAL LOW (ref 36.0–46.0)
Hemoglobin: 10.9 g/dL — ABNORMAL LOW (ref 12.0–15.0)
Immature Granulocytes: 1 %
Lymphocytes Relative: 15 %
Lymphs Abs: 0.6 10*3/uL — ABNORMAL LOW (ref 0.7–4.0)
MCH: 26.6 pg (ref 26.0–34.0)
MCHC: 31.2 g/dL (ref 30.0–36.0)
MCV: 85.1 fL (ref 80.0–100.0)
Monocytes Absolute: 0.3 10*3/uL (ref 0.1–1.0)
Monocytes Relative: 7 %
Neutro Abs: 3 10*3/uL (ref 1.7–7.7)
Neutrophils Relative %: 77 %
Platelets: 141 10*3/uL — ABNORMAL LOW (ref 150–400)
RBC: 4.1 MIL/uL (ref 3.87–5.11)
RDW: 14 % (ref 11.5–15.5)
WBC: 3.8 10*3/uL — ABNORMAL LOW (ref 4.0–10.5)
nRBC: 0 % (ref 0.0–0.2)

## 2020-11-30 LAB — GLUCOSE, CAPILLARY
Glucose-Capillary: 486 mg/dL — ABNORMAL HIGH (ref 70–99)
Glucose-Capillary: 448 mg/dL — ABNORMAL HIGH (ref 70–99)
Glucose-Capillary: 522 mg/dL (ref 70–99)
Glucose-Capillary: 482 mg/dL — ABNORMAL HIGH (ref 70–99)
Glucose-Capillary: 355 mg/dL — ABNORMAL HIGH (ref 70–99)
Glucose-Capillary: 408 mg/dL — ABNORMAL HIGH (ref 70–99)
Glucose-Capillary: 283 mg/dL — ABNORMAL HIGH (ref 70–99)
Glucose-Capillary: 182 mg/dL — ABNORMAL HIGH (ref 70–99)
Glucose-Capillary: 142 mg/dL — ABNORMAL HIGH (ref 70–99)
Glucose-Capillary: 161 mg/dL — ABNORMAL HIGH (ref 70–99)

## 2020-11-30 LAB — C-REACTIVE PROTEIN: CRP: 1.7 mg/dL — ABNORMAL HIGH (ref <1.0)

## 2020-11-30 LAB — MAGNESIUM: Magnesium: 2.2 mg/dL (ref 1.7–2.4)

## 2020-11-30 LAB — FERRITIN: Ferritin: 50 ng/mL (ref 11–307)

## 2020-11-30 LAB — D-DIMER, QUANTITATIVE: D-Dimer, Quant: 0.29 ug/mL-FEU (ref 0.00–0.50)

## 2020-11-30 LAB — PHOSPHORUS: Phosphorus: 4.4 mg/dL (ref 2.5–4.6)

## 2020-11-30 MED ORDER — LACTATED RINGERS IV SOLN: INTRAVENOUS | Status: DC

## 2020-11-30 MED ORDER — PREDNISONE 20 MG PO TABS
50.0000 mg | ORAL_TABLET | Freq: Every day | ORAL | Status: DC
  Administered 2020-11-30 – 2020-12-01 (×2): 50 mg via ORAL
  Filled 2020-11-30 (×2): qty 3

## 2020-11-30 MED ORDER — INSULIN REGULAR(HUMAN) IN NACL 100-0.9 UT/100ML-% IV SOLN
INTRAVENOUS | Status: DC
  Administered 2020-11-30: 9.5 [IU]/h via INTRAVENOUS
  Filled 2020-11-30 (×2): qty 100

## 2020-11-30 MED ORDER — INSULIN DETEMIR 100 UNIT/ML ~~LOC~~ SOLN
20.0000 [IU] | Freq: Two times a day (BID) | SUBCUTANEOUS | Status: DC
  Administered 2020-11-30: 20 [IU] via SUBCUTANEOUS
  Filled 2020-11-30 (×2): qty 0.2

## 2020-11-30 MED ORDER — DEXTROSE 50 % IV SOLN: 0.0000 mL | INTRAVENOUS | Status: DC | PRN

## 2020-11-30 MED ORDER — INSULIN ASPART 100 UNIT/ML ~~LOC~~ SOLN
20.0000 [IU] | Freq: Once | SUBCUTANEOUS | Status: AC
  Administered 2020-11-30: 20 [IU] via SUBCUTANEOUS

## 2020-11-30 NOTE — Progress Notes (Signed)
Inpatient Diabetes Program Recommendations  AACE/ADA: New Consensus Statement on Inpatient Glycemic Control (2015)  Target Ranges:  Prepandial:   less than 140 mg/dL      Peak postprandial:   less than 180 mg/dL (1-2 hours)      Critically ill patients:  140 - 180 mg/dL   Lab Results  Component Value Date   GLUCAP 355 (H) 11/30/2020   HGBA1C 6.9 (H) 11/30/2020    Review of Glycemic Control  Diabetes history: DM1 Outpatient Diabetes medications: Tresiba 12 units QHS, Novolog 2-83 units TID, Trulicity 1.5 mg weekly Current orders for Inpatient glycemic control: IV insulin per [employer] for Hyperglycemia   HgbA1C 6.6% Solumedrol d/ced Prednisone 50 mg QAM, starting 1/15  Inpatient Diabetes Program Recommendations:     When criteria met for transitioning to basal-bolus, give Lantus 1-2H prior to discontinuation of drip. Lantus 24 units Q24H Novolog 0-9 units tidwc and 0-5 HS (Type 1 and sensitive to insulin) Novolog 10 units tidwc  Will follow closely. Should improve with lowering of steroids.   F/U in am.  Thank you. Lorenda Peck, RD, LDN, CDE Inpatient Diabetes Coordinator [phone]

## 2020-11-30 NOTE — Progress Notes (Signed)
[facility] night shift.  The nursing staff reports that the patient had a CBG of 486 mg/dL.  NovoLog 20 units SQ x1 dose ordered.  Check CBG around 1 hour after administration.  Tennis Must, MD.

## 2020-11-30 NOTE — Progress Notes (Signed)
Patient with 486 blood sugar, on-call physician notified, awaiting orders.

## 2020-11-30 NOTE — Progress Notes (Signed)
[facility] Progress Note  Patient: Deanna Landry    JSE:[id number]  DOA: 11/28/2020     Date of Service: the patient was seen and examined on 11/30/2020  Brief hospital course: Past medical history of  HTN, HLD, diastolic CHF, COPD with supplemental oxygen at, DM with neuropathy, CKD stage III/IV, anxiety, and legally blind  presents with complaints of cough and shortness of breath.  Found to have COVID-19 pneumonia.  Also has severe hyperglycemia. Currently plan is continue current care.  Assessment and Plan: 1. Acute COVID-19 Viral Pneumonia CXR: hazy bilateral peripheral opacities Oxygen requirement: On room air right now CRP: 2.7-1.7 Remdesivir: On remdesivir started on 1/13 Steroids: On IV Solu-Medrol, rapid taper to prednisone Baricitinib/Actemra: Not indicated for now Antibiotics: Initially given IV ceftriaxone and azithromycin currently on hold as there is no other evidence of infection DVT Prophylaxis: heparin injection 5,000 Units Start: 11/28/20 1400  Prone positioning and incentive spirometer use recommended.  Overall plan: Monitor for improvement in fever curve and symptomatology  The treatment plan and use of medications and known side effects were discussed with patient/family. It was clearly explained that complete risks and long-term side effects are unknown. Patient/family agree with the treatment plan.    2.  Type 1 diabetes mellitus, uncontrolled with hyperglycemia with as well as CKD and neuropathy Continues to have severe hypoglycemia. Now on IV insulin.  Monitor. Reduction in prednisone dose will be helpful. Once blood sugar less than 180 transition back to basal bolus regimen with 30 units of long-acting insulin Lantus or Levemir and resistant sliding scale with 6 units Premeal coverage.  3.  HTN Blood pressure stable.  Continue current regimen  4.  Chronic diastolic CHF Currently euvolemic. Monitor.  5.  CKD stage IV Renal function stable. No  acute abnormality.  6.  Hypothyroidism Continue Synthroid.  7. Obesity Placing the patient at high risk for poor outcome from COVID-19 pneumonia Body mass index is 33.55 kg/m.   Diet: Carb modified diet DVT Prophylaxis:   heparin injection 5,000 Units Start: 11/28/20 1400    Advance goals of care discussion: Full code  Family Communication: no family was present at bedside, at the time of interview.   Disposition:  Status is: Inpatient  Remains inpatient appropriate because:IV treatments appropriate due to intensity of illness or inability to take PO   Dispo: The patient is from: Home              Anticipated d/c is to: Home              Anticipated d/c date is: 3 days              Patient currently is not medically stable to d/c.  Subjective: Continues to have cough.  Continues to have fatigue and tiredness.  Also has of shortness of breath.  No chest pain.  Physical Exam:  General: Appear in mild distress, no Rash; Oral Mucosa Clear, moist. no Abnormal Neck Mass Or lumps, Conjunctiva normal  Cardiovascular: S1 and S2 Present, no Murmur, Respiratory: increased respiratory effort, Bilateral Air entry present and bilateral  Crackles, no wheezes Abdomen: Bowel Sound present, Soft and no tenderness Extremities: trace Pedal edema Neurology: alert and oriented to time, place, and person affect appropriate. no new focal deficit Gait not checked due to patient safety concerns  Vitals:   11/29/20 1631 11/29/20 2130 11/30/20 0459 11/30/20 0949  BP: 140/77 (!) 102/59 (!) 143/47 (!) 154/68  Pulse: 67 60 61 62  Resp:  18 18 16 16   Temp: 98.2 F (36.8 C) 97.8 F (36.6 C) 97.9 F (36.6 C) 98.2 F (36.8 C)  TempSrc: Oral Oral Oral Oral  SpO2: 96% 95% 92% 93%  Weight:   83.2 kg   Height:       No intake or output data in the 24 hours ending 11/30/20 1704 Filed Weights   11/28/20 1231 11/29/20 0500 11/30/20 0459  Weight: 83.5 kg 83 kg 83.2 kg    Data Reviewed: I have  personally reviewed and interpreted daily labs, tele strips, imaging. I reviewed all nursing notes, pharmacy notes, vitals, pertinent old records I have discussed plan of care as described above with RN and patient/family.  CBC: Recent Labs  Lab 11/28/20 0455 11/28/20 2349 11/30/20 0146  WBC 3.2* 1.8* 3.8*  NEUTROABS  --  1.4* 3.0  HGB 13.2 10.5* 10.9*  HCT 41.9 32.3* 34.9*  MCV 84.8 85.7 85.1  PLT 180 134* 456*   Basic Metabolic Panel: Recent Labs  Lab 11/28/20 0455 11/28/20 2349 11/30/20 0146  NA 137 134* 132*  K 5.0 5.2* 5.9*  CL 101 102 100  CO2 22 20* 22  GLUCOSE 165* 470* 396*  BUN 24* 37* 47*  CREATININE 2.24* 2.46* 2.67*  CALCIUM 9.7 8.2* 8.6*  MG  --  2.0 2.2  PHOS  --  4.2 4.4    Studies: No results found.  Scheduled Meds: . albuterol  2 puff Inhalation BID  . amLODipine  10 mg Oral Daily  . vitamin C  500 mg Oral Daily  . aspirin EC  81 mg Oral Daily  . atorvastatin  80 mg Oral Daily  . buPROPion  300 mg Oral Daily  . clopidogrel  75 mg Oral Daily  . feeding supplement (GLUCERNA SHAKE)  237 mL Oral TID BM  . heparin  5,000 Units Subcutaneous Q8H  . HYDROcodone-acetaminophen  1 tablet Oral Q8H  . isosorbide dinitrate  20 mg Oral BID  . levothyroxine  50 mcg Oral QAC breakfast  . linagliptin  5 mg Oral Daily  . predniSONE  50 mg Oral Q breakfast  . pregabalin  75 mg Oral BID  . ranolazine  500 mg Oral BID  . scopolamine  1 patch Transdermal Q72H  . sertraline  100 mg Oral Daily  . sodium chloride flush  3 mL Intravenous Q12H  . zinc sulfate  220 mg Oral Daily   Continuous Infusions: . famotidine (PEPCID) IV 20 mg (11/30/20 1039)  . insulin 9.5 Units/hr (11/30/20 1639)  . lactated ringers 75 mL/hr at 11/30/20 1642  . remdesivir 100 mg in NS 100 mL 100 mg (11/30/20 1035)   PRN Meds: acetaminophen, albuterol, chlorpheniramine-HYDROcodone, dextrose, guaiFENesin-dextromethorphan, loratadine, ondansetron **OR** ondansetron (ZOFRAN) IV, polyvinyl  alcohol  Time spent: 35 minutes  Author: Berle Mull, MD [facility] 11/30/2020 5:04 PM  To reach On-call, see care teams to locate the attending and reach out via [email or web address]. Between 7PM-7AM, please contact night-coverage If you still have difficulty reaching the attending provider, please page the [facility] (Director on Call) for [facility] on amion for assistance.

## 2020-12-01 ENCOUNTER — Inpatient Hospital Stay (HOSPITAL_COMMUNITY): Payer: Medicare HMO

## 2020-12-01 LAB — COMPREHENSIVE METABOLIC PANEL
ALT: 11 U/L (ref 0–44)
AST: 16 U/L (ref 15–41)
Albumin: 3.1 g/dL — ABNORMAL LOW (ref 3.5–5.0)
Alkaline Phosphatase: 131 U/L — ABNORMAL HIGH (ref 38–126)
Anion gap: 10 (ref 5–15)
BUN: 49 mg/dL — ABNORMAL HIGH (ref 6–20)
CO2: 23 mmol/L (ref 22–32)
Calcium: 8.9 mg/dL (ref 8.9–10.3)
Chloride: 106 mmol/L (ref 98–111)
Creatinine, Ser: 2.43 mg/dL — ABNORMAL HIGH (ref 0.44–1.00)
GFR, Estimated: 24 mL/min — ABNORMAL LOW (ref >60)
Glucose, Bld: 216 mg/dL — ABNORMAL HIGH (ref 70–99)
Potassium: 4.5 mmol/L (ref 3.5–5.1)
Sodium: 139 mmol/L (ref 135–145)
Total Bilirubin: 0.4 mg/dL (ref 0.3–1.2)
Total Protein: 6.4 g/dL — ABNORMAL LOW (ref 6.5–8.1)

## 2020-12-01 LAB — CBC WITH DIFFERENTIAL/PLATELET
Abs Immature Granulocytes: 0.04 10*3/uL (ref 0.00–0.07)
Basophils Absolute: 0 10*3/uL (ref 0.0–0.1)
Basophils Relative: 0 %
Eosinophils Absolute: 0 10*3/uL (ref 0.0–0.5)
Eosinophils Relative: 0 %
HCT: 34.1 % — ABNORMAL LOW (ref 36.0–46.0)
Hemoglobin: 11.3 g/dL — ABNORMAL LOW (ref 12.0–15.0)
Immature Granulocytes: 1 %
Lymphocytes Relative: 9 %
Lymphs Abs: 0.5 10*3/uL — ABNORMAL LOW (ref 0.7–4.0)
MCH: 27.8 pg (ref 26.0–34.0)
MCHC: 33.1 g/dL (ref 30.0–36.0)
MCV: 83.8 fL (ref 80.0–100.0)
Monocytes Absolute: 0.2 10*3/uL (ref 0.1–1.0)
Monocytes Relative: 3 %
Neutro Abs: 4.2 10*3/uL (ref 1.7–7.7)
Neutrophils Relative %: 87 %
Platelets: 161 10*3/uL (ref 150–400)
RBC: 4.07 MIL/uL (ref 3.87–5.11)
RDW: 14.2 % (ref 11.5–15.5)
WBC: 4.9 10*3/uL (ref 4.0–10.5)
nRBC: 0 % (ref 0.0–0.2)

## 2020-12-01 LAB — GLUCOSE, CAPILLARY
Glucose-Capillary: 140 mg/dL — ABNORMAL HIGH (ref 70–99)
Glucose-Capillary: 148 mg/dL — ABNORMAL HIGH (ref 70–99)
Glucose-Capillary: 160 mg/dL — ABNORMAL HIGH (ref 70–99)
Glucose-Capillary: 160 mg/dL — ABNORMAL HIGH (ref 70–99)
Glucose-Capillary: 170 mg/dL — ABNORMAL HIGH (ref 70–99)
Glucose-Capillary: 226 mg/dL — ABNORMAL HIGH (ref 70–99)
Glucose-Capillary: 233 mg/dL — ABNORMAL HIGH (ref 70–99)
Glucose-Capillary: 287 mg/dL — ABNORMAL HIGH (ref 70–99)
Glucose-Capillary: 294 mg/dL — ABNORMAL HIGH (ref 70–99)
Glucose-Capillary: 296 mg/dL — ABNORMAL HIGH (ref 70–99)
Glucose-Capillary: 381 mg/dL — ABNORMAL HIGH (ref 70–99)
Glucose-Capillary: 79 mg/dL (ref 70–99)
Glucose-Capillary: 86 mg/dL (ref 70–99)

## 2020-12-01 LAB — MAGNESIUM: Magnesium: 1.9 mg/dL (ref 1.7–2.4)

## 2020-12-01 LAB — FERRITIN: Ferritin: 43 ng/mL (ref 11–307)

## 2020-12-01 LAB — C-REACTIVE PROTEIN: CRP: 1.1 mg/dL — ABNORMAL HIGH (ref <1.0)

## 2020-12-01 LAB — PHOSPHORUS: Phosphorus: 3.5 mg/dL (ref 2.5–4.6)

## 2020-12-01 LAB — D-DIMER, QUANTITATIVE: D-Dimer, Quant: 0.34 ug/mL-FEU (ref 0.00–0.50)

## 2020-12-01 MED ORDER — PREDNISONE 20 MG PO TABS
30.0000 mg | ORAL_TABLET | Freq: Every day | ORAL | Status: DC
  Administered 2020-12-02 – 2020-12-04 (×3): 30 mg via ORAL
  Filled 2020-12-01 (×3): qty 1

## 2020-12-01 MED ORDER — INSULIN ASPART 100 UNIT/ML ~~LOC~~ SOLN
0.0000 [IU] | Freq: Three times a day (TID) | SUBCUTANEOUS | Status: DC
  Administered 2020-12-01: 11 [IU] via SUBCUTANEOUS
  Administered 2020-12-01: 20 [IU] via SUBCUTANEOUS
  Administered 2020-12-02: 4 [IU] via SUBCUTANEOUS
  Administered 2020-12-03: 11 [IU] via SUBCUTANEOUS
  Administered 2020-12-03: 7 [IU] via SUBCUTANEOUS
  Administered 2020-12-04 (×2): 3 [IU] via SUBCUTANEOUS

## 2020-12-01 MED ORDER — INSULIN ASPART 100 UNIT/ML ~~LOC~~ SOLN
0.0000 [IU] | Freq: Every day | SUBCUTANEOUS | Status: DC
  Administered 2020-12-01 – 2020-12-03 (×3): 2 [IU] via SUBCUTANEOUS

## 2020-12-01 MED ORDER — INSULIN GLARGINE 100 UNIT/ML ~~LOC~~ SOLN
30.0000 [IU] | Freq: Every day | SUBCUTANEOUS | Status: DC
  Administered 2020-12-01 – 2020-12-02 (×2): 30 [IU] via SUBCUTANEOUS
  Filled 2020-12-01 (×2): qty 0.3

## 2020-12-01 MED ORDER — INSULIN ASPART 100 UNIT/ML ~~LOC~~ SOLN
6.0000 [IU] | Freq: Three times a day (TID) | SUBCUTANEOUS | Status: DC
  Administered 2020-12-01: 6 [IU] via SUBCUTANEOUS

## 2020-12-01 MED ORDER — PREDNISONE 20 MG PO TABS: 40.0000 mg | ORAL_TABLET | Freq: Every day | ORAL | Status: DC

## 2020-12-01 MED ORDER — INSULIN GLARGINE 100 UNIT/ML ~~LOC~~ SOLN
40.0000 [IU] | Freq: Every day | SUBCUTANEOUS | Status: DC
  Filled 2020-12-01: qty 0.4

## 2020-12-01 MED ORDER — INSULIN ASPART 100 UNIT/ML ~~LOC~~ SOLN
10.0000 [IU] | Freq: Three times a day (TID) | SUBCUTANEOUS | Status: DC
  Administered 2020-12-01 – 2020-12-02 (×4): 10 [IU] via SUBCUTANEOUS

## 2020-12-01 MED ORDER — POLYETHYLENE GLYCOL 3350 17 G PO PACK
17.0000 g | PACK | Freq: Every day | ORAL | Status: DC
  Administered 2020-12-02 – 2020-12-03 (×2): 17 g via ORAL
  Filled 2020-12-01 (×3): qty 1

## 2020-12-01 MED ORDER — SENNOSIDES-DOCUSATE SODIUM 8.6-50 MG PO TABS
1.0000 | ORAL_TABLET | Freq: Two times a day (BID) | ORAL | Status: DC
  Administered 2020-12-01 – 2020-12-02 (×2): 1 via ORAL
  Filled 2020-12-01 (×2): qty 1

## 2020-12-01 NOTE — Progress Notes (Signed)
[facility] Progress Note  Patient: Deanna Landry    ZOX:[id number]  DOA: 11/28/2020     Date of Service: the patient was seen and examined on 12/01/2020  Brief hospital course: Past medical history of  HTN, HLD, diastolic CHF, COPD with supplemental oxygen at, DM with neuropathy, CKD stage III/IV, anxiety, and legally blind  presents with complaints of cough and shortness of breath.  Found to have COVID-19 pneumonia.  Also has severe hyperglycemia. Currently plan is continue current care.  Assessment and Plan: 1. Acute COVID-19 Viral Pneumonia CXR: hazy bilateral peripheral opacities Oxygen requirement: On room air right now CRP: 2.7-1.7-1.1 Remdesivir: On remdesivir started on 1/13, last day 1/17 Steroids: On IV Solu-Medrol, rapid taper to prednisone Baricitinib/Actemra: Not indicated for now Antibiotics: Initially given IV ceftriaxone and azithromycin currently on hold as there is no other evidence of infection DVT Prophylaxis: heparin injection 5,000 Units Start: 11/28/20 1400  Prone positioning and incentive spirometer use recommended.  Overall plan: Monitor for improvement in fever curve and symptomatology  The treatment plan and use of medications and known side effects were discussed with patient/family. It was clearly explained that complete risks and long-term side effects are unknown. Patient/family agree with the treatment plan.    2.  Type 1 diabetes mellitus, uncontrolled with hyperglycemia with as well as CKD and neuropathy Continues to have severe hypoglycemia. Was started on IV insulin. Blood sugars now better. We will transition to subcutaneous basal bolus regimen. Reduction in prednisone dose will be helpful.  3.  HTN Blood pressure stable.  Continue current regimen  4.  Chronic diastolic CHF Currently euvolemic. Monitor.  5.  CKD stage IV Renal function stable. No acute abnormality.  6.  Hypothyroidism Continue Synthroid.  7. Obesity Placing the  patient at high risk for poor outcome from COVID-19 pneumonia Body mass index is 33.55 kg/m.   8. constipation. Abdominal pain. Gastroparesis. X-ray abdomen. Will initiate bowel regimen based on the results of the x-ray. Also has gastroparesis resume Reglan pending x-ray result.  Diet: Carb modified diet DVT Prophylaxis:   heparin injection 5,000 Units Start: 11/28/20 1400  Advance goals of care discussion: Full code  Family Communication: no family was present at bedside, at the time of interview.   Disposition:  Status is: Inpatient  Remains inpatient appropriate because:IV treatments appropriate due to intensity of illness or inability to take PO   Dispo: The patient is from: Home              Anticipated d/c is to: Home              Anticipated d/c date is: 3 days              Patient currently is not medically stable to d/c.  Subjective: Continues to have some cough.  Reports poor p.o. intake today.  Also reports abdominal discomfort when ambulating.  No nausea or vomiting.  Passing gas.  Physical Exam:  General: Appear in moderate distress, no Rash; Oral Mucosa Clear, moist. no Abnormal Neck Mass Or lumps, Conjunctiva normal  Cardiovascular: S1 and S2 Present, no Murmur, Respiratory: increased respiratory effort, Bilateral Air entry present and bilateral  Crackles, no wheezes Abdomen: Bowel Sound present, Soft and mild lower abdominal tenderness Extremities: trace Pedal edema Neurology: alert and oriented to time, place, and person affect appropriate. no new focal deficit Gait not checked due to patient safety concerns  Vitals:   11/30/20 1809 12/01/20 0000 12/01/20 0600 12/01/20 0803  BP: Marland Kitchen)  132/59 (!) 130/56 139/62 (!) 153/71  Pulse: 64 66 64 68  Resp: 20 18 18 14   Temp: (!) 97.5 F (36.4 C) 97.9 F (36.6 C) 97.6 F (36.4 C) 97.6 F (36.4 C)  TempSrc: Oral Oral Oral   SpO2: 95% 96% 95% 96%  Weight:      Height:        Intake/Output Summary (Last 24  hours) at 12/01/2020 1326 Last data filed at 12/01/2020 0915 Gross per 24 hour  Intake 530 ml  Output 800 ml  Net -270 ml   Filed Weights   11/28/20 1231 11/29/20 0500 11/30/20 0459  Weight: 83.5 kg 83 kg 83.2 kg    Data Reviewed: I have personally reviewed and interpreted daily labs, tele strips, imaging. I reviewed all nursing notes, pharmacy notes, vitals, pertinent old records I have discussed plan of care as described above with RN and patient/family.  CBC: Recent Labs  Lab 11/28/20 0455 11/28/20 2349 11/30/20 0146 12/01/20 0152  WBC 3.2* 1.8* 3.8* 4.9  NEUTROABS  --  1.4* 3.0 4.2  HGB 13.2 10.5* 10.9* 11.3*  HCT 41.9 32.3* 34.9* 34.1*  MCV 84.8 85.7 85.1 83.8  PLT 180 134* 141* 315   Basic Metabolic Panel: Recent Labs  Lab 11/28/20 0455 11/28/20 2349 11/30/20 0146 12/01/20 0152  NA 137 134* 132* 139  K 5.0 5.2* 5.9* 4.5  CL 101 102 100 106  CO2 22 20* 22 23  GLUCOSE 165* 470* 396* 216*  BUN 24* 37* 47* 49*  CREATININE 2.24* 2.46* 2.67* 2.43*  CALCIUM 9.7 8.2* 8.6* 8.9  MG  --  2.0 2.2 1.9  PHOS  --  4.2 4.4 3.5    Studies: No results found.  Scheduled Meds: . albuterol  2 puff Inhalation BID  . amLODipine  10 mg Oral Daily  . vitamin C  500 mg Oral Daily  . aspirin EC  81 mg Oral Daily  . atorvastatin  80 mg Oral Daily  . buPROPion  300 mg Oral Daily  . clopidogrel  75 mg Oral Daily  . feeding supplement (GLUCERNA SHAKE)  237 mL Oral TID BM  . heparin  5,000 Units Subcutaneous Q8H  . HYDROcodone-acetaminophen  1 tablet Oral Q8H  . insulin aspart  0-20 Units Subcutaneous TID WC  . insulin aspart  0-5 Units Subcutaneous QHS  . insulin aspart  10 Units Subcutaneous TID WC  . insulin glargine  30 Units Subcutaneous Daily  . isosorbide dinitrate  20 mg Oral BID  . levothyroxine  50 mcg Oral QAC breakfast  . linagliptin  5 mg Oral Daily  . [START ON 12/02/2020] predniSONE  40 mg Oral Q breakfast  . pregabalin  75 mg Oral BID  . ranolazine  500 mg  Oral BID  . scopolamine  1 patch Transdermal Q72H  . sertraline  100 mg Oral Daily  . sodium chloride flush  3 mL Intravenous Q12H  . zinc sulfate  220 mg Oral Daily   Continuous Infusions: . famotidine (PEPCID) IV 20 mg (12/01/20 0902)  . remdesivir 100 mg in NS 100 mL 100 mg (12/01/20 1016)   PRN Meds: acetaminophen, albuterol, chlorpheniramine-HYDROcodone, guaiFENesin-dextromethorphan, loratadine, ondansetron **OR** ondansetron (ZOFRAN) IV, polyvinyl alcohol  Time spent: 35 minutes  Author: Berle Mull, MD [facility] 12/01/2020 1:26 PM  To reach On-call, see care teams to locate the attending and reach out via [email or web address]. Between 7PM-7AM, please contact night-coverage If you still have difficulty reaching the attending provider, please page the  [facility] (Director on Call) for [facility] on amion for assistance.

## 2020-12-02 ENCOUNTER — Other Ambulatory Visit: Payer: Self-pay

## 2020-12-02 LAB — COMPREHENSIVE METABOLIC PANEL
ALT: 11 U/L (ref 0–44)
AST: 16 U/L (ref 15–41)
Albumin: 3 g/dL — ABNORMAL LOW (ref 3.5–5.0)
Alkaline Phosphatase: 128 U/L — ABNORMAL HIGH (ref 38–126)
Anion gap: 10 (ref 5–15)
BUN: 47 mg/dL — ABNORMAL HIGH (ref 6–20)
CO2: 24 mmol/L (ref 22–32)
Calcium: 8.7 mg/dL — ABNORMAL LOW (ref 8.9–10.3)
Chloride: 104 mmol/L (ref 98–111)
Creatinine, Ser: 2.48 mg/dL — ABNORMAL HIGH (ref 0.44–1.00)
GFR, Estimated: 23 mL/min — ABNORMAL LOW (ref >60)
Glucose, Bld: 295 mg/dL — ABNORMAL HIGH (ref 70–99)
Potassium: 5.3 mmol/L — ABNORMAL HIGH (ref 3.5–5.1)
Sodium: 138 mmol/L (ref 135–145)
Total Bilirubin: 0.4 mg/dL (ref 0.3–1.2)
Total Protein: 6 g/dL — ABNORMAL LOW (ref 6.5–8.1)

## 2020-12-02 LAB — CBC WITH DIFFERENTIAL/PLATELET
Abs Immature Granulocytes: 0.06 10*3/uL (ref 0.00–0.07)
Basophils Absolute: 0 10*3/uL (ref 0.0–0.1)
Basophils Relative: 0 %
Eosinophils Absolute: 0 10*3/uL (ref 0.0–0.5)
Eosinophils Relative: 0 %
HCT: 35.2 % — ABNORMAL LOW (ref 36.0–46.0)
Hemoglobin: 11 g/dL — ABNORMAL LOW (ref 12.0–15.0)
Immature Granulocytes: 1 %
Lymphocytes Relative: 22 %
Lymphs Abs: 1.1 10*3/uL (ref 0.7–4.0)
MCH: 26.6 pg (ref 26.0–34.0)
MCHC: 31.3 g/dL (ref 30.0–36.0)
MCV: 85 fL (ref 80.0–100.0)
Monocytes Absolute: 0.5 10*3/uL (ref 0.1–1.0)
Monocytes Relative: 9 %
Neutro Abs: 3.5 10*3/uL (ref 1.7–7.7)
Neutrophils Relative %: 68 %
Platelets: 162 10*3/uL (ref 150–400)
RBC: 4.14 MIL/uL (ref 3.87–5.11)
RDW: 14.1 % (ref 11.5–15.5)
WBC: 5.2 10*3/uL (ref 4.0–10.5)
nRBC: 0 % (ref 0.0–0.2)

## 2020-12-02 LAB — GLUCOSE, CAPILLARY
Glucose-Capillary: 215 mg/dL — ABNORMAL HIGH (ref 70–99)
Glucose-Capillary: 154 mg/dL — ABNORMAL HIGH (ref 70–99)
Glucose-Capillary: 107 mg/dL — ABNORMAL HIGH (ref 70–99)
Glucose-Capillary: 114 mg/dL — ABNORMAL HIGH (ref 70–99)
Glucose-Capillary: 245 mg/dL — ABNORMAL HIGH (ref 70–99)

## 2020-12-02 LAB — D-DIMER, QUANTITATIVE: D-Dimer, Quant: 0.39 ug/mL-FEU (ref 0.00–0.50)

## 2020-12-02 LAB — C-REACTIVE PROTEIN: CRP: 1.7 mg/dL — ABNORMAL HIGH (ref <1.0)

## 2020-12-02 MED ORDER — INSULIN ASPART 100 UNIT/ML ~~LOC~~ SOLN
6.0000 [IU] | Freq: Three times a day (TID) | SUBCUTANEOUS | Status: DC
  Administered 2020-12-03 – 2020-12-04 (×5): 6 [IU] via SUBCUTANEOUS

## 2020-12-02 MED ORDER — METOCLOPRAMIDE HCL 5 MG/ML IJ SOLN
10.0000 mg | Freq: Three times a day (TID) | INTRAMUSCULAR | Status: DC
  Administered 2020-12-02 – 2020-12-03 (×3): 10 mg via INTRAVENOUS
  Filled 2020-12-02 (×4): qty 2

## 2020-12-02 MED ORDER — INSULIN GLARGINE 100 UNIT/ML ~~LOC~~ SOLN
25.0000 [IU] | Freq: Every day | SUBCUTANEOUS | Status: DC
  Administered 2020-12-03 – 2020-12-04 (×2): 25 [IU] via SUBCUTANEOUS
  Filled 2020-12-02 (×2): qty 0.25

## 2020-12-02 MED ORDER — LACTULOSE 10 GM/15ML PO SOLN
20.0000 g | Freq: Two times a day (BID) | ORAL | Status: DC
  Administered 2020-12-02 – 2020-12-03 (×3): 20 g via ORAL
  Filled 2020-12-02 (×5): qty 30

## 2020-12-02 MED ORDER — BISACODYL 10 MG RE SUPP: 10.0000 mg | Freq: Every day | RECTAL | Status: DC | PRN

## 2020-12-02 MED ORDER — SENNOSIDES-DOCUSATE SODIUM 8.6-50 MG PO TABS
2.0000 | ORAL_TABLET | Freq: Two times a day (BID) | ORAL | Status: DC
  Administered 2020-12-02 – 2020-12-03 (×3): 2 via ORAL
  Filled 2020-12-02 (×4): qty 2

## 2020-12-02 NOTE — Progress Notes (Signed)
[facility] Progress Note  Patient: Deanna Landry    RFF:[id number]  DOA: 11/28/2020     Date of Service: the patient was seen and examined on 12/02/2020  Brief hospital course: Past medical history of  HTN, HLD, diastolic CHF, COPD with supplemental oxygen at, DM with neuropathy, CKD stage III/IV, anxiety, and legally blind  presents with complaints of cough and shortness of breath.  Found to have COVID-19 pneumonia.  Also has severe hyperglycemia. Currently plan is continue current care.  Assessment and Plan: 1. Acute COVID-19 Viral Pneumonia CXR: hazy bilateral peripheral opacities Oxygen requirement: On room air right now CRP: 2.7-1.7-1.1 Remdesivir: On remdesivir started on 1/13, last day 1/17 Steroids: On IV Solu-Medrol, rapid taper to prednisone Baricitinib/Actemra: Not indicated for now Antibiotics: Initially given IV ceftriaxone and azithromycin currently on hold as there is no other evidence of infection DVT Prophylaxis: heparin injection 5,000 Units Start: 11/28/20 1400  Prone positioning and incentive spirometer use recommended.  Overall plan: Monitor for improvement in fever curve and symptomatology  The treatment plan and use of medications and known side effects were discussed with patient/family. It was clearly explained that complete risks and long-term side effects are unknown. Patient/family agree with the treatment plan.    2.  Type 1 diabetes mellitus, uncontrolled with hyperglycemia with as well as CKD and neuropathy Continues to have severe hypoglycemia. Was started on IV insulin. Blood sugars now better. Continue subcutaneous basal bolus regimen. Reduction in prednisone dose will be helpful.  3.  HTN Blood pressure stable.  Continue current regimen  4.  Chronic diastolic CHF Currently euvolemic. Monitor.  5.  CKD stage IV Renal function stable. No acute abnormality.  6.  Hypothyroidism Continue Synthroid.  7. Obesity Placing the patient at  high risk for poor outcome from COVID-19 pneumonia Body mass index is 33.55 kg/m.   8. constipation. Abdominal pain. Gastroparesis. X-ray abdomen shows moderate stool burden. Continue with Reglan as well as bowel regimen.  Monitor.  Diet: Carb modified diet DVT Prophylaxis:   heparin injection 5,000 Units Start: 11/28/20 1400  Advance goals of care discussion: Full code  Family Communication: no family was present at bedside, at the time of interview.   Disposition:  Status is: Inpatient  Remains inpatient appropriate because:IV treatments appropriate due to intensity of illness or inability to take PO   Dispo: The patient is from: Home              Anticipated d/c is to: Home              Anticipated d/c date is: 3 days              Patient currently is not medically stable to d/c.  Subjective: No nausea vomiting but no fever no chills.  Reports abdominal discomfort.  Passing gas.  At baseline has to push on her tailbone to have a bowel movement on a regular basis.  Physical Exam:  General: Appear in mild distress, no Rash; Oral Mucosa Clear, moist. no Abnormal Neck Mass Or lumps, Conjunctiva normal  Cardiovascular: S1 and S2 Present, no Murmur, Respiratory: increased respiratory effort, Bilateral Air entry present and bilateral  Crackles, no wheezes Abdomen: Bowel Sound present, Soft and no tenderness Extremities: trace Pedal edema Neurology: alert and oriented to time, place, and person affect appropriate. no new focal deficit Gait not checked due to patient safety concerns    Vitals:   12/02/20 1035 12/02/20 1223 12/02/20 1743 12/02/20 1933  BP: Marland Kitchen)  174/74 (!) 157/69 (!) 123/59 125/62  Pulse: 68 78 72 70  Resp:  17 17 16   Temp:  98.2 F (36.8 C) 98.5 F (36.9 C) 98.2 F (36.8 C)  TempSrc:  Oral Oral Oral  SpO2:  96% 98% 96%  Weight:      Height:        Intake/Output Summary (Last 24 hours) at 12/02/2020 2107 Last data filed at 12/02/2020 1529 Gross per 24  hour  Intake 723 ml  Output 100 ml  Net 623 ml   Filed Weights   11/28/20 1231 11/29/20 0500 11/30/20 0459  Weight: 83.5 kg 83 kg 83.2 kg    Data Reviewed: I have personally reviewed and interpreted daily labs, tele strips, imaging. I reviewed all nursing notes, pharmacy notes, vitals, pertinent old records I have discussed plan of care as described above with RN and patient/family.  CBC: Recent Labs  Lab 11/28/20 0455 11/28/20 2349 11/30/20 0146 12/01/20 0152 12/02/20 0436  WBC 3.2* 1.8* 3.8* 4.9 5.2  NEUTROABS  --  1.4* 3.0 4.2 3.5  HGB 13.2 10.5* 10.9* 11.3* 11.0*  HCT 41.9 32.3* 34.9* 34.1* 35.2*  MCV 84.8 85.7 85.1 83.8 85.0  PLT 180 134* 141* 161 595   Basic Metabolic Panel: Recent Labs  Lab 11/28/20 0455 11/28/20 2349 11/30/20 0146 12/01/20 0152 12/02/20 0058  NA 137 134* 132* 139 138  K 5.0 5.2* 5.9* 4.5 5.3*  CL 101 102 100 106 104  CO2 22 20* 22 23 24   GLUCOSE 165* 470* 396* 216* 295*  BUN 24* 37* 47* 49* 47*  CREATININE 2.24* 2.46* 2.67* 2.43* 2.48*  CALCIUM 9.7 8.2* 8.6* 8.9 8.7*  MG  --  2.0 2.2 1.9  --   PHOS  --  4.2 4.4 3.5  --     Studies: No results found.  Scheduled Meds: . amLODipine  10 mg Oral Daily  . vitamin C  500 mg Oral Daily  . aspirin EC  81 mg Oral Daily  . atorvastatin  80 mg Oral Daily  . buPROPion  300 mg Oral Daily  . clopidogrel  75 mg Oral Daily  . heparin  5,000 Units Subcutaneous Q8H  . HYDROcodone-acetaminophen  1 tablet Oral Q8H  . insulin aspart  0-20 Units Subcutaneous TID WC  . insulin aspart  0-5 Units Subcutaneous QHS  . insulin aspart  10 Units Subcutaneous TID WC  . insulin glargine  30 Units Subcutaneous Daily  . isosorbide dinitrate  20 mg Oral BID  . lactulose  20 g Oral BID  . levothyroxine  50 mcg Oral QAC breakfast  . linagliptin  5 mg Oral Daily  . metoCLOPramide (REGLAN) injection  10 mg Intravenous TID  . polyethylene glycol  17 g Oral Daily  . predniSONE  30 mg Oral Q breakfast  .  pregabalin  75 mg Oral BID  . ranolazine  500 mg Oral BID  . scopolamine  1 patch Transdermal Q72H  . senna-docusate  2 tablet Oral BID  . sertraline  100 mg Oral Daily  . sodium chloride flush  3 mL Intravenous Q12H  . zinc sulfate  220 mg Oral Daily   Continuous Infusions:  PRN Meds: acetaminophen, albuterol, bisacodyl, chlorpheniramine-HYDROcodone, guaiFENesin-dextromethorphan, loratadine, ondansetron **OR** ondansetron (ZOFRAN) IV, polyvinyl alcohol  Time spent: 35 minutes  Author: Berle Mull, MD [facility] 12/02/2020 9:07 PM  To reach On-call, see care teams to locate the attending and reach out via [email or web address]. Between 7PM-7AM, please contact night-coverage If  you still have difficulty reaching the attending provider, please page the [facility] (Director on Call) for [facility] on amion for assistance.

## 2020-12-02 NOTE — Consult Note (Signed)
   [facility] CM Deanna Consult   12/02/2020  Deanna Landry Mar 08, 1971 [id number]  Deanna Landry [ACO] Patient:  Deanna Landry Medicare HMO and Deanna Landry Medicare SNP   Patient is currently active with Deanna Landry Management for chronic disease management services.  Patient has been engaged by a Deanna Landry however, also when patient has been hard to maintain contact.  Our community based plan of care has focused on disease management and community resource support.    Patient will receive a post Landry call and will be evaluated for assessments and disease process education.    Plan:  Will reach out to Deanna Landry and alert to Deanna Landry SNP is noted. Will alert Deanna Landry member to make aware that Deanna Landry Management was following and patient is now in a SNP plan with Deanna Landry.  Of note, [facility] Care Management services does not replace or interfere with any services that are needed or arranged by [facility] care management Land[facility].  For additional questions or referrals please contact:  Deanna Brood, RN BSN Deanna Landry  [phone] business mobile phone Toll free office [phone]  Fax number: [phone] Deanna Landry [email or web address]

## 2020-12-02 NOTE — Patient Outreach (Signed)
[name] Care Management  12/02/2020  Deanna Landry 10-14-1971 [id number]   Case Closure   RN CM received notification from [facility] liaison that patient admitted to the hospital and currently remains inpatient. Patient also a part of [employer] SNP CM program.    Plan: RN CM will close case at this time.   Enzo Montgomery, RN,BSN,CCM [facility] Management Telephonic Care Management Coordinator Direct Phone: [phone] Toll Free: [phone] Fax: [phone]

## 2020-12-03 LAB — C-REACTIVE PROTEIN: CRP: 0.6 mg/dL (ref <1.0)

## 2020-12-03 LAB — COMPREHENSIVE METABOLIC PANEL
ALT: 15 U/L (ref 0–44)
AST: 18 U/L (ref 15–41)
Albumin: 3.1 g/dL — ABNORMAL LOW (ref 3.5–5.0)
Alkaline Phosphatase: 108 U/L (ref 38–126)
Anion gap: 9 (ref 5–15)
BUN: 46 mg/dL — ABNORMAL HIGH (ref 6–20)
CO2: 26 mmol/L (ref 22–32)
Calcium: 8.8 mg/dL — ABNORMAL LOW (ref 8.9–10.3)
Chloride: 102 mmol/L (ref 98–111)
Creatinine, Ser: 2.06 mg/dL — ABNORMAL HIGH (ref 0.44–1.00)
GFR, Estimated: 29 mL/min — ABNORMAL LOW (ref >60)
Glucose, Bld: 273 mg/dL — ABNORMAL HIGH (ref 70–99)
Potassium: 5.7 mmol/L — ABNORMAL HIGH (ref 3.5–5.1)
Sodium: 137 mmol/L (ref 135–145)
Total Bilirubin: 0.4 mg/dL (ref 0.3–1.2)
Total Protein: 6.4 g/dL — ABNORMAL LOW (ref 6.5–8.1)

## 2020-12-03 LAB — GLUCOSE, CAPILLARY
Glucose-Capillary: 228 mg/dL — ABNORMAL HIGH (ref 70–99)
Glucose-Capillary: 105 mg/dL — ABNORMAL HIGH (ref 70–99)
Glucose-Capillary: 232 mg/dL — ABNORMAL HIGH (ref 70–99)

## 2020-12-03 LAB — CBC WITH DIFFERENTIAL/PLATELET
Abs Immature Granulocytes: 0.06 10*3/uL (ref 0.00–0.07)
Basophils Absolute: 0 10*3/uL (ref 0.0–0.1)
Basophils Relative: 0 %
Eosinophils Absolute: 0 10*3/uL (ref 0.0–0.5)
Eosinophils Relative: 0 %
HCT: 33.7 % — ABNORMAL LOW (ref 36.0–46.0)
Hemoglobin: 11.2 g/dL — ABNORMAL LOW (ref 12.0–15.0)
Immature Granulocytes: 2 %
Lymphocytes Relative: 22 %
Lymphs Abs: 0.7 10*3/uL (ref 0.7–4.0)
MCH: 27.9 pg (ref 26.0–34.0)
MCHC: 33.2 g/dL (ref 30.0–36.0)
MCV: 84 fL (ref 80.0–100.0)
Monocytes Absolute: 0.2 10*3/uL (ref 0.1–1.0)
Monocytes Relative: 7 %
Neutro Abs: 2.1 10*3/uL (ref 1.7–7.7)
Neutrophils Relative %: 69 %
Platelets: 150 10*3/uL (ref 150–400)
RBC: 4.01 MIL/uL (ref 3.87–5.11)
RDW: 14.2 % (ref 11.5–15.5)
WBC: 3.1 10*3/uL — ABNORMAL LOW (ref 4.0–10.5)
nRBC: 0 % (ref 0.0–0.2)

## 2020-12-03 LAB — CULTURE, BLOOD (ROUTINE X 2)
Culture: NO GROWTH
Special Requests: ADEQUATE

## 2020-12-03 LAB — D-DIMER, QUANTITATIVE: D-Dimer, Quant: 0.3 ug/mL-FEU (ref 0.00–0.50)

## 2020-12-03 MED ORDER — METOCLOPRAMIDE HCL 5 MG/ML IJ SOLN
10.0000 mg | Freq: Four times a day (QID) | INTRAMUSCULAR | Status: DC
  Administered 2020-12-03 – 2020-12-04 (×4): 10 mg via INTRAVENOUS
  Filled 2020-12-03 (×3): qty 2

## 2020-12-03 NOTE — Progress Notes (Signed)
Pt ambulated in room on room air. Oxygen saturation 96% at rest and 93-95% while ambulating. Pt tolerated well with rolling walker and standby assist.

## 2020-12-03 NOTE — Progress Notes (Signed)
[facility] Progress Note  Patient: Deanna Landry    [id number]  DOA: 11/28/2020     Date of Service: the patient was seen and examined on 12/03/2020  Brief hospital course: Past medical history of HTN, HLD, diastolic CHF, COPDwithsupplemental oxygen at, DM with neuropathy, CKD stage III/IV, anxiety, and legally blind presents with complaints of cough and shortness of breath.  Found to have COVID-19 pneumonia.  Also has severe hyperglycemia Currently plan is monitor for resolution of gastroparesis flareup.  Assessment and Plan: 1. Acute COVID-19 Viral Pneumonia CXR: hazy bilateral peripheral opacities Oxygen requirement: On room air right now CRP: 2.7-1.7-1.1 Remdesivir: On remdesivir started on 1/13, last day 1/17 Steroids: On IV Solu-Medrol, rapid taper to prednisone Baricitinib/Actemra: Not indicated for now Antibiotics: Initially given IV ceftriaxone and azithromycin currently on hold as there is no other evidence of infection DVT Prophylaxis: heparin injection 5,000 Units Start: 11/28/20 1400  Prone positioning and incentive spirometer use recommended.  Overall plan: Monitor for improvement in fever curve and symptomatology  The treatment plan and use of medications and known side effects were discussed with patient/family. It was clearly explained that complete risks and long-term side effects are unknown. Patient/family agree with the treatment plan.    2.  Type 1 diabetes mellitus, uncontrolled with hyperglycemia with as well as CKD and neuropathy Continues to have severe hypoglycemia. Was started on IV insulin. Blood sugars now better. Continue subcutaneous basal bolus regimen. Reduction in prednisone dose will be helpful.  3.  HTN Blood pressure stable.  Continue current regimen  4.  Chronic diastolic CHF Currently euvolemic. Monitor.  5.  CKD stage IV Renal function stable. No acute abnormality. Follows up with nephrology outpatient Dr.  Candiss Norse  6.  Hypothyroidism Continue Synthroid.  7. Obesity Placing the patient at high risk for poor outcome from COVID-19 pneumonia Body mass index is 33.55 kg/m.   8. constipation. Abdominal pain. Gastroparesis. X-ray abdomen shows moderate stool burden. Continue with Reglan as well as bowel regimen.    Once gastroparesis resolves and patient is able to tolerate p.o. and have bowel movement likely can go home. Monitor.  Body mass index is 33.55 kg/m.    Interventions:        Diet: Carb modified diet DVT Prophylaxis:   heparin injection 5,000 Units Start: 11/28/20 1400    Advance goals of care discussion: Full code  Family Communication: no family was present at bedside, at the time of interview.  Discussed with mother on the phone. The pt provided permission to discuss medical plan with the family. Opportunity was given to ask question and all questions were answered satisfactorily.   Disposition:  Status is: Inpatient  Remains inpatient appropriate because:IV treatments appropriate due to intensity of illness or inability to take PO and Inpatient level of care appropriate due to severity of illness   Dispo: The patient is from: Home              Anticipated d/c is to: Home              Anticipated d/c date is: 1 day              Patient currently is not medically stable to d/c.        Subjective: Reports nausea without any vomiting.  Also reports abdominal pain.  Not passing gas.  No BM so far.  No chest pain or shortness of breath.  Has some cough.  Physical Exam:  General: Appear in  mild distress, no Rash; Oral Mucosa Clear, moist. no Abnormal Neck Mass Or lumps, Conjunctiva normal  Cardiovascular: S1 and S2 Present, no Murmur, Respiratory: increased respiratory effort, Bilateral Air entry present and bilateral  Crackles, no wheezes Abdomen: Bowel Sound present, Soft and no tenderness Extremities: trace Pedal edema Neurology: alert and oriented to  time, place, and person affect appropriate. no new focal deficit Gait not checked due to patient safety concerns    Vitals:   12/03/20 0937 12/03/20 1145 12/03/20 1809 12/03/20 1942  BP: (!) 150/71 115/64 137/84 140/70  Pulse: 83 81 84 80  Resp: '15 16 10 17  '$ Temp: 97.7 F (36.5 C)  98.3 F (36.8 C) 98.4 F (36.9 C)  TempSrc: Oral Oral Oral Oral  SpO2: 98% 98% 96% 96%  Weight:      Height:        Intake/Output Summary (Last 24 hours) at 12/03/2020 2046 Last data filed at 12/03/2020 1418 Gross per 24 hour  Intake 480 ml  Output 6 ml  Net 474 ml   Filed Weights   11/28/20 1231 11/29/20 0500 11/30/20 0459  Weight: 83.5 kg 83 kg 83.2 kg    Data Reviewed: I have personally reviewed and interpreted daily labs, tele strips, imaging. I reviewed all nursing notes, pharmacy notes, vitals, pertinent old records I have discussed plan of care as described above with RN and patient/family.  CBC: Recent Labs  Lab 11/28/20 2349 11/30/20 0146 12/01/20 0152 12/02/20 0436 12/03/20 0236  WBC 1.8* 3.8* 4.9 5.2 3.1*  NEUTROABS 1.4* 3.0 4.2 3.5 2.1  HGB 10.5* 10.9* 11.3* 11.0* 11.2*  HCT 32.3* 34.9* 34.1* 35.2* 33.7*  MCV 85.7 85.1 83.8 85.0 84.0  PLT 134* 141* 161 162 [id number]   Basic Metabolic Panel: Recent Labs  Lab 11/28/20 2349 11/30/20 0146 12/01/20 0152 12/02/20 0058 12/03/20 0236  NA 134* 132* 139 138 137  K 5.2* 5.9* 4.5 5.3* 5.7*  CL 102 100 106 104 102  CO2 20* '22 23 24 26  '$ GLUCOSE 470* 396* 216* 295* 273*  BUN 37* 47* 49* 47* 46*  CREATININE 2.46* 2.67* 2.43* 2.48* 2.06*  CALCIUM 8.2* 8.6* 8.9 8.7* 8.8*  MG 2.0 2.2 1.9  --   --   PHOS 4.2 4.4 3.5  --   --     Studies: No results found.  Scheduled Meds: . amLODipine  10 mg Oral Daily  . vitamin C  500 mg Oral Daily  . aspirin EC  81 mg Oral Daily  . atorvastatin  80 mg Oral Daily  . buPROPion  300 mg Oral Daily  . clopidogrel  75 mg Oral Daily  . heparin  5,000 Units Subcutaneous Q8H  .  HYDROcodone-acetaminophen  1 tablet Oral Q8H  . insulin aspart  0-20 Units Subcutaneous TID WC  . insulin aspart  0-5 Units Subcutaneous QHS  . insulin aspart  6 Units Subcutaneous TID WC  . insulin glargine  25 Units Subcutaneous Daily  . isosorbide dinitrate  20 mg Oral BID  . lactulose  20 g Oral BID  . levothyroxine  50 mcg Oral QAC breakfast  . linagliptin  5 mg Oral Daily  . metoCLOPramide (REGLAN) injection  10 mg Intravenous Q6H  . polyethylene glycol  17 g Oral Daily  . predniSONE  30 mg Oral Q breakfast  . pregabalin  75 mg Oral BID  . ranolazine  500 mg Oral BID  . scopolamine  1 patch Transdermal Q72H  . senna-docusate  2 tablet  Oral BID  . sertraline  100 mg Oral Daily  . sodium chloride flush  3 mL Intravenous Q12H  . zinc sulfate  220 mg Oral Daily   Continuous Infusions: PRN Meds: acetaminophen, albuterol, bisacodyl, chlorpheniramine-HYDROcodone, guaiFENesin-dextromethorphan, loratadine, ondansetron **OR** ondansetron (ZOFRAN) IV, polyvinyl alcohol  Time spent: 35 minutes  Author: Berle Mull, MD [facility] 12/03/2020 8:46 PM  To reach On-call, see care teams to locate the attending and reach out via [email or web address]. Between 7PM-7AM, please contact night-coverage If you still have difficulty reaching the attending provider, please page the [facility] (Director on Call) for [facility] on amion for assistance.

## 2020-12-04 LAB — BASIC METABOLIC PANEL
Anion gap: 11 (ref 5–15)
BUN: 46 mg/dL — ABNORMAL HIGH (ref 6–20)
CO2: 27 mmol/L (ref 22–32)
Calcium: 8.9 mg/dL (ref 8.9–10.3)
Chloride: 101 mmol/L (ref 98–111)
Creatinine, Ser: 2.13 mg/dL — ABNORMAL HIGH (ref 0.44–1.00)
GFR, Estimated: 28 mL/min — ABNORMAL LOW (ref >60)
Glucose, Bld: 175 mg/dL — ABNORMAL HIGH (ref 70–99)
Potassium: 4.9 mmol/L (ref 3.5–5.1)
Sodium: 139 mmol/L (ref 135–145)

## 2020-12-04 LAB — CBC
HCT: 34 % — ABNORMAL LOW (ref 36.0–46.0)
Hemoglobin: 11.1 g/dL — ABNORMAL LOW (ref 12.0–15.0)
MCH: 27.5 pg (ref 26.0–34.0)
MCHC: 32.6 g/dL (ref 30.0–36.0)
MCV: 84.4 fL (ref 80.0–100.0)
Platelets: 156 10*3/uL (ref 150–400)
RBC: 4.03 MIL/uL (ref 3.87–5.11)
RDW: 14.1 % (ref 11.5–15.5)
WBC: 3.6 10*3/uL — ABNORMAL LOW (ref 4.0–10.5)
nRBC: 0 % (ref 0.0–0.2)

## 2020-12-04 LAB — CULTURE, BLOOD (ROUTINE X 2)
Culture: NO GROWTH
Special Requests: ADEQUATE

## 2020-12-04 LAB — MAGNESIUM: Magnesium: 2.4 mg/dL (ref 1.7–2.4)

## 2020-12-04 LAB — GLUCOSE, CAPILLARY
Glucose-Capillary: 274 mg/dL — ABNORMAL HIGH (ref 70–99)
Glucose-Capillary: 134 mg/dL — ABNORMAL HIGH (ref 70–99)
Glucose-Capillary: 129 mg/dL — ABNORMAL HIGH (ref 70–99)

## 2020-12-04 MED ORDER — BENZONATATE 100 MG PO CAPS: 100.0000 mg | ORAL_CAPSULE | Freq: Three times a day (TID) | ORAL | 0 refills | Status: DC | PRN

## 2020-12-04 MED ORDER — METOCLOPRAMIDE HCL 5 MG PO TABS: 5.0000 mg | ORAL_TABLET | Freq: Three times a day (TID) | ORAL | 1 refills | Status: DC

## 2020-12-04 MED ORDER — POLYETHYLENE GLYCOL 3350 17 G PO PACK: 17.0000 g | PACK | Freq: Every day | ORAL | 0 refills | Status: DC

## 2020-12-04 MED ORDER — PREDNISONE 20 MG PO TABS: ORAL_TABLET | ORAL | 0 refills | Status: DC

## 2020-12-04 MED ORDER — SENNOSIDES-DOCUSATE SODIUM 8.6-50 MG PO TABS: 2.0000 | ORAL_TABLET | Freq: Two times a day (BID) | ORAL | 0 refills | Status: DC

## 2020-12-04 MED ORDER — LACTULOSE 10 GM/15ML PO SOLN: 20.0000 g | Freq: Two times a day (BID) | ORAL | 0 refills | Status: DC | PRN

## 2020-12-04 NOTE — Progress Notes (Signed)
Pt given discharge instructions. Telemetry removed, CCMD notified. PIVs removed, sites clean, dry and intact. Patients belongings packed and taken down with patient in wheelchair. Left VIA [facility] transport. Transport consent signed by patient and placed in chart.  Daymon Larsen, RN

## 2020-12-04 NOTE — Discharge Summary (Signed)
[facility]  Physician Discharge Summary   Patient ID: Deanna Landry MRN: [id number] DOB/AGE: [birth year] 50 y.o.  Admit date: 11/28/2020 Discharge date: 12/04/2020  PCP: Cher Nakai, MD  DISCHARGE DIAGNOSES:  Pneumonia due to COVID-19 Diabetes mellitus type 1, uncontrolled with hyperglycemia Diabetic gastroparesis Chronic diastolic CHF Chronic kidney disease stage IV Essential hypertension Hypothyroidism  RECOMMENDATIONS FOR OUTPATIENT FOLLOW UP: 1. Follow-up with PCP in 1 week    Home Health: None Equipment/Devices: None  CODE STATUS: Full code  DISCHARGE CONDITION: fair  Diet recommendation: Modified carb  INITIAL HISTORY: Past medical history of HTN, HLD, diastolic CHF, COPDwithsupplemental oxygen at, DM with neuropathy, CKD stage III/IV, anxiety, and legally blindpresents with complaints of cough and shortness of breath. Found to have COVID-19 pneumonia. Also has severe hyperglycemia   HOSPITAL COURSE:   Acute COVID-19 Viral Pneumonia Chest x-ray showed bilateral peripheral opacities.  Patient did not have any significant O2 requirements.  Currently saturating normal on room air.  Patient was given a course of Remdesivir and steroids.  Patient quickly improved.  She was thought to have mild disease.  Will need only 10 days of isolation from her positive test.    Type 1 diabetes mellitus, uncontrolled with hyperglycemia with as well as CKD and neuropathy Had uncontrolled glucose levels in the hospital due to steroid use.  Improved now that the steroids have been decreased in dose.  May resume her usual home medication regimen.  HbA1c 6.9.  Diabetic gastroparesis/constipation X-ray of the abdomen showed moderate stool burden.  Patient was also having significant nausea and vomiting.  She was placed on IV Reglan.  She was given laxatives with good resolution of her constipation.  She also had been tolerating her oral intake without problem.  She will be  asked to continue her metoclopramide at discharge but with increase frequency.    Essential hypertension  Blood pressure stable. Continue current regimen  Chronic diastolic CHF Stable  CKD stage IV Renal function stable. No acute abnormality. Follows up with nephrology outpatient Dr. Candiss Norse  Hypothyroidism Continue Synthroid.  Obesity Estimated body mass index is 33.55 kg/m as calculated from the following:   Height as of this encounter: '5\' 2"'$  (1.575 m).   Weight as of this encounter: 83.2 kg.   Overall stable.  Patient has had a bowel movement.  She has tolerated oral intake over the last 2 days.  Respiratory status is stable.  Okay for discharge home today.  PERTINENT LABS:  The results of significant diagnostics from this hospitalization (including imaging, microbiology, ancillary and laboratory) are listed below for reference.    Microbiology: Recent Results (from the past 240 hour(s))  Resp Panel by RT-PCR (Flu A&B, Covid) Nasopharyngeal Swab     Status: Abnormal   Collection Time: 11/28/20  5:00 AM   Specimen: Nasopharyngeal Swab; Nasopharyngeal(NP) swabs in vial transport medium  Result Value Ref Range Status   SARS Coronavirus 2 by RT PCR POSITIVE (A) NEGATIVE Final    Comment: RESULT CALLED TO, READ BACK BY AND VERIFIED WITH: Barbaraann Boys RN 11/28/20 0601 JDW (NOTE) SARS-CoV-2 target nucleic acids are DETECTED.  The SARS-CoV-2 RNA is generally detectable in upper respiratory specimens during the acute phase of infection. Positive results are indicative of the presence of the identified virus, but do not rule out bacterial infection or co-infection with other pathogens not detected by the test. Clinical correlation with patient history and other diagnostic information is necessary to determine patient infection status. The expected result is Negative.  Fact Sheet for Patients: EntrepreneurPulse.com.au  Fact Sheet for Healthcare  Providers: IncredibleEmployment.be  This test is not yet approved or cleared by the [place] FDA and  has been authorized for detection and/or diagnosis of SARS-CoV-2 by FDA under an Emergency Use Authorization (EUA).  This EUA will remain in effect (meaning this test can be  used) for the duration of  the COVID-19 declaration under Section 564(b)(1) of the Act, 21 U.S.C. section 360bbb-3(b)(1), unless the authorization is terminated or revoked sooner.     Influenza A by PCR NEGATIVE NEGATIVE Final   Influenza B by PCR NEGATIVE NEGATIVE Final    Comment: (NOTE) The Xpert Xpress SARS-CoV-2/FLU/RSV plus assay is intended as an aid in the diagnosis of influenza from Nasopharyngeal swab specimens and should not be used as a sole basis for treatment. Nasal washings and aspirates are unacceptable for Xpert Xpress SARS-CoV-2/FLU/RSV testing.  Fact Sheet for Patients: EntrepreneurPulse.com.au  Fact Sheet for Healthcare Providers: IncredibleEmployment.be  This test is not yet approved or cleared by the [place] FDA and has been authorized for detection and/or diagnosis of SARS-CoV-2 by FDA under an Emergency Use Authorization (EUA). This EUA will remain in effect (meaning this test can be used) for the duration of the COVID-19 declaration under Section 564(b)(1) of the Act, 21 U.S.C. section 360bbb-3(b)(1), unless the authorization is terminated or revoked.  Performed at [facility], [place] [address] [place], [place] [address]   Culture, blood (Routine X 2) w Reflex to ID Panel     Status: None   Collection Time: 11/28/20 12:16 PM   Specimen: BLOOD  Result Value Ref Range Status   Specimen Description BLOOD LEFT ANTECUBITAL  Final   Special Requests   Final    BOTTLES DRAWN AEROBIC AND ANAEROBIC Blood Culture adequate volume   Culture   Final    NO GROWTH 5 DAYS Performed at [facility], [place] [address], [place], [place] [address]    Report Status 12/03/2020 FINAL  Final  Culture, blood (Routine X 2) w Reflex to ID Panel     Status: None   Collection Time: 11/28/20 11:59 PM   Specimen: BLOOD  Result Value Ref Range Status   Specimen Description BLOOD LEFT ANTECUBITAL  Final   Special Requests   Final    BOTTLES DRAWN AEROBIC AND ANAEROBIC Blood Culture adequate volume   Culture   Final    NO GROWTH 5 DAYS Performed at [facility], [place] [address], [place], [place] [address]    Report Status 12/04/2020 FINAL  Final     Labs:  COVID-19 Labs  Recent Labs    12/02/20 0058 12/03/20 0236  DDIMER 0.39 0.30  CRP 1.7* 0.6    Lab Results  Component Value Date   SARSCOV2NAA POSITIVE (A) 11/28/2020   [place] NEGATIVE 08/21/2020   [place] NEGATIVE 07/17/2020   [place] NEGATIVE 10/04/2019      Basic Metabolic Panel: Recent Labs  Lab 11/28/20 2349 11/30/20 0146 12/01/20 0152 12/02/20 0058 12/03/20 0236 12/04/20 0127  NA 134* 132* 139 138 137 139  K 5.2* 5.9* 4.5 5.3* 5.7* 4.9  CL 102 100 106 104 102 101  CO2 20* '22 23 24 26 27  '$ GLUCOSE 470* 396* 216* 295* 273* 175*  BUN 37* 47* 49* 47* 46* 46*  CREATININE 2.46* 2.67* 2.43* 2.48* 2.06* 2.13*  CALCIUM 8.2* 8.6* 8.9 8.7* 8.8* 8.9  MG 2.0 2.2 1.9  --   --  2.4  PHOS 4.2 4.4 3.5  --   --   --    Liver Function Tests: Recent Labs  Lab 11/28/20 2349 11/30/20 0146 12/01/20 0152 12/02/20 0058 12/03/20 0236  AST '15 17 16 16 18  '$ ALT '12 10 11 11 15  '$ ALKPHOS 114 117 131* 128* 108  BILITOT 0.6 0.4 0.4 0.4 0.4  PROT 6.2* 6.5 6.4* 6.0* 6.4*  ALBUMIN 3.0* 3.1* 3.1* 3.0* 3.1*   CBC: Recent Labs  Lab 11/28/20 2349 11/30/20 0146 12/01/20 0152 12/02/20 0436 12/03/20 0236 12/04/20 0127  WBC 1.8* 3.8* 4.9 5.2 3.1* 3.6*  NEUTROABS 1.4* 3.0 4.2 3.5 2.1  --   HGB 10.5* 10.9* 11.3* 11.0* 11.2* 11.1*  HCT 32.3* 34.9* 34.1* 35.2* 33.7* 34.0*  MCV 85.7 85.1 83.8 85.0 84.0 84.4  PLT 134* 141* 161 162 150 156    BNP: BNP (last 3 results) Recent Labs    11/28/20 1214  BNP 30.3    CBG: Recent Labs  Lab 12/03/20 1142 12/03/20 1802 12/03/20 2114 12/04/20 0558 12/04/20 1153  GLUCAP 105* 274* 232* 134* 129*     IMAGING STUDIES DG Chest Portable 1 View  Result Date: 11/28/2020 CLINICAL DATA:  Shortness of breath EXAM: PORTABLE CHEST 1 VIEW COMPARISON:  Radiograph 10/15/2019 FINDINGS: Some chronic bandlike opacities in both lower lungs likely reflect areas of scarring. There is mild airways thickening and some reticular interstitial opacities within the mid to lower lungs which could reflect atelectasis or early infection. No pneumothorax or effusion. The cardiomediastinal contours are unremarkable. No acute osseous or soft tissue abnormality. IMPRESSION: 1. Mild airways thickening and reticular opacities in the lungs could reflect early or atypical infection in the given clinical setting. 2. Chronic bandlike opacities in both lower lungs likely reflect areas of scarring. Electronically Signed   By: Lovena Le M.D.   On: 11/28/2020 05:12   DG Abd Portable 1V  Result Date: 12/01/2020 CLINICAL DATA:  Constipation.  Abdominal pain. EXAM: PORTABLE ABDOMEN - 1 VIEW COMPARISON:  CT, 10/04/2019. FINDINGS: Normal bowel gas pattern.  Mild overall colonic stool burden. No evidence of renal or ureteral stones. Soft tissues are unremarkable. Thoracolumbar scoliosis.  No acute skeletal abnormalities. IMPRESSION: 1. No acute findings. No evidence of bowel obstruction. Mild overall colonic stool burden. Evidence of significant constipation. Electronically Signed   By: Lajean Manes M.D.   On: 12/01/2020 15:33    DISCHARGE EXAMINATION: Vitals:   12/03/20 2324 12/04/20 0301 12/04/20 0900 12/04/20 1155  BP: 135/73 135/76 (!) 157/77 (!) 117/57  Pulse: 75 79 82 97  Resp: '17 15 13 17  '$ Temp: 98.4 F (36.9 C) 97.9 F (36.6 C) 97.8 F (36.6 C) 98.2 F (36.8 C)  TempSrc: Oral Oral Oral Oral  SpO2: 98% 94% 99%  97%  Weight:      Height:       General appearance: Awake alert.  In no distress Resp: Clear to auscultation bilaterally.  Normal effort Cardio: S1-S2 is normal regular.  No S3-S4.  No rubs murmurs or bruit GI: Abdomen is soft.  Nontender nondistended.  Bowel sounds are present normal.  No masses organomegaly Extremities: No edema.  Full range of motion of lower extremities. Neurologic: Alert and oriented x3.  No focal neurological deficits.    DISPOSITION: Home  Discharge Instructions    Call MD for:  difficulty breathing, headache or visual disturbances   Complete by: As directed    Call MD for:  extreme fatigue   Complete by: As directed  Call MD for:  persistant dizziness or light-headedness   Complete by: As directed    Call MD for:  persistant nausea and vomiting   Complete by: As directed    Call MD for:  severe uncontrolled pain   Complete by: As directed    Call MD for:  temperature >100.4   Complete by: As directed    Diet - low sodium heart healthy   Complete by: As directed    Diet Carb Modified   Complete by: As directed    Discharge instructions   Complete by: As directed    Please be sure to follow-up with your PCP within 1 week.  Take your medications as prescribed.  You will need to stay isolated for 10 days from your initial positive test.  COVID 19 INSTRUCTIONS  - You are felt to be stable enough to no longer require inpatient monitoring, testing, and treatment, though you will need to follow the recommendations below: - Do not take NSAID medications (including, but not limited to, ibuprofen, advil, motrin, naproxen, aleve, goody's powder, etc.) - Follow up with your doctor in the next week via telehealth or seek medical attention right away if your symptoms get WORSE.    Directions for you at home:  Wear a facemask You should wear a facemask that covers your nose and mouth when you are in the same room with other people and when you visit a  healthcare provider. People who live with or visit you should also wear a facemask while they are in the same room with you.  Separate yourself from other people in your home As much as possible, you should stay in a different room from other people in your home. Also, you should use a separate bathroom, if available.  Avoid sharing household items You should not share dishes, drinking glasses, cups, eating utensils, towels, bedding, or other items with other people in your home. After using these items, you should wash them thoroughly with soap and water.  Cover your coughs and sneezes Cover your mouth and nose with a tissue when you cough or sneeze, or you can cough or sneeze into your sleeve. Throw used tissues in a lined trash can, and immediately wash your hands with soap and water for at least 20 seconds or use an alcohol-based hand rub.  Wash your [facility] your hands often and thoroughly with soap and water for at least 20 seconds. You can use an alcohol-based hand sanitizer if soap and water are not available and if your hands are not visibly dirty. Avoid touching your eyes, nose, and mouth with unwashed hands.  Directions for those who live with, or provide care at home for you:  Limit the number of people who have contact with the patient If possible, have only one caregiver for the patient. Other household members should stay in another home or place of residence. If this is not possible, they should stay in another room, or be separated from the patient as much as possible. Use a separate bathroom, if available. Restrict visitors who do not have an essential need to be in the home.  Ensure good ventilation Make sure that shared spaces in the home have good air flow, such as from an air conditioner or an opened window, weather permitting.  Wash your hands often Wash your hands often and thoroughly with soap and water for at least 20 seconds. You can use an alcohol based  hand sanitizer if soap and water  are not available and if your hands are not visibly dirty. Avoid touching your eyes, nose, and mouth with unwashed hands. Use disposable paper towels to dry your hands. If not available, use dedicated cloth towels and replace them when they become wet.  Wear a facemask and gloves Wear a disposable facemask at all times in the room and gloves when you touch or have contact with the patient's blood, body fluids, and/or secretions or excretions, such as sweat, saliva, sputum, nasal mucus, vomit, urine, or feces.  Ensure the mask fits over your nose and mouth tightly, and do not touch it during use. Throw out disposable facemasks and gloves after using them. Do not reuse. Wash your hands immediately after removing your facemask and gloves. If your personal clothing becomes contaminated, carefully remove clothing and launder. Wash your hands after handling contaminated clothing. Place all used disposable facemasks, gloves, and other waste in a lined container before disposing them with other household waste. Remove gloves and wash your hands immediately after handling these items.  Do not share dishes, glasses, or other household items with the patient Avoid sharing household items. You should not share dishes, drinking glasses, cups, eating utensils, towels, bedding, or other items with a patient who is confirmed to have, or being evaluated for, COVID-19 infection. After the person uses these items, you should wash them thoroughly with soap and water.  Wash laundry thoroughly Immediately remove and wash clothes or bedding that have blood, body fluids, and/or secretions or excretions, such as sweat, saliva, sputum, nasal mucus, vomit, urine, or feces, on them. Wear gloves when handling laundry from the patient. Read and follow directions on labels of laundry or clothing items and detergent. In general, wash and dry with the warmest temperatures recommended on the  label.  Clean all areas the individual has used often Clean all touchable surfaces, such as counters, tabletops, doorknobs, bathroom fixtures, toilets, phones, keyboards, tablets, and bedside tables, every day. Also, clean any surfaces that may have blood, body fluids, and/or secretions or excretions on them. Wear gloves when cleaning surfaces the patient has come in contact with. Use a diluted bleach solution (e.g., dilute bleach with 1 part bleach and 10 parts water) or a household disinfectant with a label that says EPA-registered for coronaviruses. To make a bleach solution at home, add 1 tablespoon of bleach to 1 quart (4 cups) of water. For a larger supply, add  cup of bleach to 1 gallon (16 cups) of water. Read labels of cleaning products and follow recommendations provided on product labels. Labels contain instructions for safe and effective use of the cleaning product including precautions you should take when applying the product, such as wearing gloves or eye protection and making sure you have good ventilation during use of the product. Remove gloves and wash hands immediately after cleaning.  Monitor yourself for signs and symptoms of illness Caregivers and household members are considered close contacts, should monitor their health, and will be asked to limit movement outside of the home to the extent possible. Follow the monitoring steps for close contacts listed on the symptom monitoring form.   If you have additional questions, contact your local health department or call the epidemiologist on call at [phone] (available 24/7). This guidance is subject to change. For the most up-to-date guidance from [facility], please refer to their website: YouBlogs.pl   You were cared for by a hospitalist during your hospital stay. If you have any questions about your discharge medications or the  care you received while you were in  the hospital after you are discharged, you can call the unit and asked to speak with the hospitalist on call if the hospitalist that took care of you is not available. Once you are discharged, your primary care physician will handle any further medical issues. Please note that NO REFILLS for any discharge medications will be authorized once you are discharged, as it is imperative that you return to your primary care physician (or establish a relationship with a primary care physician if you do not have one) for your aftercare needs so that they can reassess your need for medications and monitor your lab values. If you do not have a primary care physician, you can call [phone] for a physician referral.   Increase activity slowly   Complete by: As directed          Allergies as of 12/04/2020   No Known Allergies     Medication List    STOP taking these medications   oxyCODONE-acetaminophen 5-325 MG tablet Commonly known as: Percocet     TAKE these medications   albuterol 108 (90 Base) MCG/ACT inhaler Commonly known as: VENTOLIN HFA Inhale 1-2 puffs into the lungs every 6 (six) hours as needed for wheezing or shortness of breath.   amLODipine 10 MG tablet Commonly known as: NORVASC Take 10 mg by mouth daily. Take 1/2 tablet daily   aspirin EC 81 MG tablet Take 81 mg by mouth daily.   atorvastatin 80 MG tablet Commonly known as: LIPITOR Take 80 mg by mouth daily.   benzonatate 100 MG capsule Commonly known as: Tessalon Perles Take 1 capsule (100 mg total) by mouth 3 (three) times daily as needed for cough.   buPROPion 300 MG 24 hr tablet Commonly known as: WELLBUTRIN XL Take 300 mg by mouth daily.   clopidogrel 75 MG tablet Commonly known as: PLAVIX Take 75 mg by mouth daily.   Dexcom G6 Receiver Devi Continuous glucose monitoring   Dexcom G6 Sensor Misc Apply new sensor every 10 days   Dexcom G6 Transmitter Misc Replace every 3 months   Easy Comfort Pen Needles  31G X 8 MM Misc Generic drug: Insulin Pen Needle Check sugar 3x  daily   glucose blood test strip Use as instructed   Gvoke HypoPen 2-Pack 1 MG/0.2ML Soaj Generic drug: Glucagon Inject 1 mg into the skin as needed. For severe lows What changed: reasons to take this   HYDROcodone-acetaminophen 5-325 MG tablet Commonly known as: NORCO/VICODIN Take 1 tablet by mouth every 8 (eight) hours.   isosorbide dinitrate 20 MG tablet Commonly known as: ISORDIL Take 20 mg by mouth 2 (two) times daily.   lactulose 10 GM/15ML solution Commonly known as: CHRONULAC Take 30 mLs (20 g total) by mouth 2 (two) times daily as needed for mild constipation.   levothyroxine 50 MCG tablet Commonly known as: SYNTHROID Take 50 mcg by mouth daily before breakfast.   loratadine 10 MG tablet Commonly known as: CLARITIN Take 10 mg by mouth daily as needed for allergies.   methocarbamol 750 MG tablet Commonly known as: Robaxin-750 Take 1 tablet (750 mg total) by mouth every 6 (six) hours as needed for muscle spasms.   metoCLOPramide 5 MG tablet Commonly known as: REGLAN Take 1 tablet (5 mg total) by mouth 4 (four) times daily -  before meals and at bedtime. What changed: when to take this   metoprolol tartrate 25 MG tablet Commonly known as: LOPRESSOR Take  12.5 mg by mouth 2 (two) times daily.   NovoLOG 100 UNIT/ML injection Generic drug: insulin aspart Inject 2-10 Units into the skin with breakfast, with lunch, and with evening meal. Sliding scales   pantoprazole 40 MG tablet Commonly known as: PROTONIX Take 40 mg by mouth daily.   polyethylene glycol 17 g packet Commonly known as: MIRALAX / GLYCOLAX Take 17 g by mouth daily. Start taking on: December 05, 2020   predniSONE 20 MG tablet Commonly known as: DELTASONE Take 3 tablets once daily for 3 days followed by 2 tablets once daily for 3 days followed by 1 tablet once daily for 3 days and then stop   pregabalin 75 MG capsule Commonly  known as: LYRICA Take 75 mg by mouth 2 (two) times daily.   ranolazine 500 MG 12 hr tablet Commonly known as: RANEXA TAKE 1 TABLET BY MOUTH TWICE A DAY   senna-docusate 8.6-50 MG tablet Commonly known as: Senokot-S Take 2 tablets by mouth 2 (two) times daily.   sertraline 100 MG tablet Commonly known as: ZOLOFT Take 100 mg by mouth daily.   Systane 0.4-0.3 % Gel ophthalmic gel Generic drug: Polyethyl Glycol-Propyl Glycol Place 1 application into both eyes 3 (three) times daily as needed (dry/irritated eyes.).   [name] FlexTouch 200 UNIT/ML FlexTouch Pen Generic drug: insulin degludec INJECT 30 UNITS INTO THE SKIN DAILY. What changed: See the new instructions.   Trulicity 1.5 0000000 Sopn Generic drug: Dulaglutide Inject 1.5 mg into the skin every Monday.         Follow-up Information    Cher Nakai, MD. Schedule an appointment as soon as possible for a visit in 1 week(s).   Specialty: Internal Medicine Contact information: [place] [address] [phone]               TOTAL DISCHARGE TIME: 35-minute  Bonnielee Haff  [facility] Pager on [email or web address]  12/04/2020, 5:40 PM

## 2020-12-04 NOTE — Discharge Instructions (Signed)

## 2020-12-15 DIAGNOSIS — I5042 Chronic combined systolic (congestive) and diastolic (congestive) heart failure: Secondary | ICD-10-CM | POA: Diagnosis not present

## 2020-12-16 DIAGNOSIS — J449 Chronic obstructive pulmonary disease, unspecified: Secondary | ICD-10-CM | POA: Diagnosis not present

## 2020-12-16 DIAGNOSIS — R110 Nausea: Secondary | ICD-10-CM | POA: Diagnosis not present

## 2020-12-16 DIAGNOSIS — G4733 Obstructive sleep apnea (adult) (pediatric): Secondary | ICD-10-CM | POA: Diagnosis not present

## 2020-12-16 DIAGNOSIS — D649 Anemia, unspecified: Secondary | ICD-10-CM | POA: Diagnosis not present

## 2020-12-16 DIAGNOSIS — I679 Cerebrovascular disease, unspecified: Secondary | ICD-10-CM | POA: Diagnosis not present

## 2020-12-16 DIAGNOSIS — M79671 Pain in right foot: Secondary | ICD-10-CM | POA: Diagnosis not present

## 2020-12-16 DIAGNOSIS — G5603 Carpal tunnel syndrome, bilateral upper limbs: Secondary | ICD-10-CM | POA: Diagnosis not present

## 2020-12-16 DIAGNOSIS — I509 Heart failure, unspecified: Secondary | ICD-10-CM | POA: Diagnosis not present

## 2020-12-16 DIAGNOSIS — N184 Chronic kidney disease, stage 4 (severe): Secondary | ICD-10-CM | POA: Diagnosis not present

## 2020-12-17 ENCOUNTER — Other Ambulatory Visit: Payer: Self-pay | Admitting: *Deleted

## 2020-12-17 ENCOUNTER — Telehealth: Payer: Self-pay | Admitting: Endocrinology

## 2020-12-17 DIAGNOSIS — E1022 Type 1 diabetes mellitus with diabetic chronic kidney disease: Secondary | ICD-10-CM | POA: Diagnosis not present

## 2020-12-17 DIAGNOSIS — E1065 Type 1 diabetes mellitus with hyperglycemia: Secondary | ICD-10-CM | POA: Diagnosis not present

## 2020-12-17 NOTE — Telephone Encounter (Signed)
Pt called to request a prescription for Dexcom G6 to be sent to same place [name] was sent to ([facility])

## 2020-12-18 NOTE — Telephone Encounter (Signed)
Vaughan Basta has,

## 2020-12-18 NOTE — Telephone Encounter (Signed)
Okay to send Dexcom.  She is also supposed to be getting the T-slim insulin pump, has anyone contacted her about this

## 2020-12-18 NOTE — Telephone Encounter (Signed)
Please advise if okay to send the Dexcom instead of the freestyle.

## 2020-12-26 ENCOUNTER — Telehealth: Payer: Self-pay | Admitting: Dietician

## 2020-12-30 DIAGNOSIS — R110 Nausea: Secondary | ICD-10-CM | POA: Diagnosis not present

## 2020-12-30 DIAGNOSIS — N184 Chronic kidney disease, stage 4 (severe): Secondary | ICD-10-CM | POA: Diagnosis not present

## 2020-12-30 DIAGNOSIS — I509 Heart failure, unspecified: Secondary | ICD-10-CM | POA: Diagnosis not present

## 2020-12-30 DIAGNOSIS — E039 Hypothyroidism, unspecified: Secondary | ICD-10-CM | POA: Diagnosis not present

## 2020-12-30 DIAGNOSIS — Z794 Long term (current) use of insulin: Secondary | ICD-10-CM | POA: Diagnosis not present

## 2020-12-30 DIAGNOSIS — M159 Polyosteoarthritis, unspecified: Secondary | ICD-10-CM | POA: Diagnosis not present

## 2020-12-30 DIAGNOSIS — E1121 Type 2 diabetes mellitus with diabetic nephropathy: Secondary | ICD-10-CM | POA: Diagnosis not present

## 2020-12-30 DIAGNOSIS — J449 Chronic obstructive pulmonary disease, unspecified: Secondary | ICD-10-CM | POA: Diagnosis not present

## 2020-12-30 DIAGNOSIS — G4733 Obstructive sleep apnea (adult) (pediatric): Secondary | ICD-10-CM | POA: Diagnosis not present

## 2020-12-30 NOTE — Telephone Encounter (Signed)
LVM that Dexcom script was sent to [facility] mail order on 9/21.  Gave their telephone number to call to verify.  Advised her to call to get update and let me know if she needs anything else.

## 2021-01-02 DIAGNOSIS — E1065 Type 1 diabetes mellitus with hyperglycemia: Secondary | ICD-10-CM | POA: Diagnosis not present

## 2021-01-09 DIAGNOSIS — G4710 Hypersomnia, unspecified: Secondary | ICD-10-CM | POA: Diagnosis not present

## 2021-01-09 DIAGNOSIS — G4733 Obstructive sleep apnea (adult) (pediatric): Secondary | ICD-10-CM | POA: Diagnosis not present

## 2021-01-13 ENCOUNTER — Telehealth: Payer: Self-pay | Admitting: Nutrition

## 2021-01-13 DIAGNOSIS — G4733 Obstructive sleep apnea (adult) (pediatric): Secondary | ICD-10-CM | POA: Diagnosis not present

## 2021-01-13 DIAGNOSIS — I679 Cerebrovascular disease, unspecified: Secondary | ICD-10-CM | POA: Diagnosis not present

## 2021-01-13 DIAGNOSIS — I5042 Chronic combined systolic (congestive) and diastolic (congestive) heart failure: Secondary | ICD-10-CM | POA: Diagnosis not present

## 2021-01-13 DIAGNOSIS — E039 Hypothyroidism, unspecified: Secondary | ICD-10-CM | POA: Diagnosis not present

## 2021-01-13 DIAGNOSIS — N184 Chronic kidney disease, stage 4 (severe): Secondary | ICD-10-CM | POA: Diagnosis not present

## 2021-01-13 DIAGNOSIS — R110 Nausea: Secondary | ICD-10-CM | POA: Diagnosis not present

## 2021-01-13 DIAGNOSIS — I509 Heart failure, unspecified: Secondary | ICD-10-CM | POA: Diagnosis not present

## 2021-01-13 DIAGNOSIS — M79671 Pain in right foot: Secondary | ICD-10-CM | POA: Diagnosis not present

## 2021-01-13 DIAGNOSIS — J449 Chronic obstructive pulmonary disease, unspecified: Secondary | ICD-10-CM | POA: Diagnosis not present

## 2021-01-13 DIAGNOSIS — G5603 Carpal tunnel syndrome, bilateral upper limbs: Secondary | ICD-10-CM | POA: Diagnosis not present

## 2021-01-13 NOTE — Telephone Encounter (Signed)
Message left to call me concerning her pump

## 2021-01-14 DIAGNOSIS — E1065 Type 1 diabetes mellitus with hyperglycemia: Secondary | ICD-10-CM | POA: Diagnosis not present

## 2021-01-14 DIAGNOSIS — E1022 Type 1 diabetes mellitus with diabetic chronic kidney disease: Secondary | ICD-10-CM | POA: Diagnosis not present

## 2021-01-21 ENCOUNTER — Other Ambulatory Visit: Payer: Self-pay

## 2021-01-21 ENCOUNTER — Encounter: Payer: Medicare HMO | Attending: Endocrinology | Admitting: Nutrition

## 2021-01-21 ENCOUNTER — Ambulatory Visit: Payer: Medicare HMO | Admitting: Endocrinology

## 2021-01-21 DIAGNOSIS — R6889 Other general symptoms and signs: Secondary | ICD-10-CM | POA: Diagnosis not present

## 2021-01-21 DIAGNOSIS — E669 Obesity, unspecified: Secondary | ICD-10-CM | POA: Insufficient documentation

## 2021-01-21 DIAGNOSIS — E1169 Type 2 diabetes mellitus with other specified complication: Secondary | ICD-10-CM | POA: Diagnosis not present

## 2021-01-22 NOTE — Patient Instructions (Signed)
Use Dexcom app on phone to see what blood sugar is doing Read over pump manual before coming to start pump

## 2021-01-22 NOTE — Progress Notes (Signed)
Deanna Landry is here to start her insulin pump training.  She charged her pump, but did not bring her pump cartridges and infusion sets.  She was trained/started on the Dexcom Lot [id number] Exp. 8/22.   She inserted it into her right upper,outer arm and inserted the transmitter.  Settings were put into the pump per Dr. Ronnie Derby orders:  Basal rate: 0.45, ISF: 70, I/C 1 (pt. Does not know how to count carbs), target: 120.  Control I Q was turned on and linked to Dexcom and to [place].   Deanna Landry is not able to return to see Dr. Dwyane Dee tomorrow or Thurs., plus she has no supplies, so she is not able to start this pump today.  Pt. Rescheduled for Serene 18th.  Deanna Landry to try to schedule with dr. Dwyane Dee.  If not able, will reschedule as needed.

## 2021-01-27 DIAGNOSIS — R110 Nausea: Secondary | ICD-10-CM | POA: Diagnosis not present

## 2021-01-27 DIAGNOSIS — J449 Chronic obstructive pulmonary disease, unspecified: Secondary | ICD-10-CM | POA: Diagnosis not present

## 2021-01-27 DIAGNOSIS — N184 Chronic kidney disease, stage 4 (severe): Secondary | ICD-10-CM | POA: Diagnosis not present

## 2021-01-27 DIAGNOSIS — E039 Hypothyroidism, unspecified: Secondary | ICD-10-CM | POA: Diagnosis not present

## 2021-01-27 DIAGNOSIS — M79671 Pain in right foot: Secondary | ICD-10-CM | POA: Diagnosis not present

## 2021-01-27 DIAGNOSIS — I509 Heart failure, unspecified: Secondary | ICD-10-CM | POA: Diagnosis not present

## 2021-01-27 DIAGNOSIS — G4733 Obstructive sleep apnea (adult) (pediatric): Secondary | ICD-10-CM | POA: Diagnosis not present

## 2021-01-27 DIAGNOSIS — I679 Cerebrovascular disease, unspecified: Secondary | ICD-10-CM | POA: Diagnosis not present

## 2021-01-27 DIAGNOSIS — G5603 Carpal tunnel syndrome, bilateral upper limbs: Secondary | ICD-10-CM | POA: Diagnosis not present

## 2021-01-28 ENCOUNTER — Encounter: Payer: Medicare HMO | Admitting: Nutrition

## 2021-01-28 ENCOUNTER — Telehealth: Payer: Self-pay | Admitting: Nutrition

## 2021-01-28 NOTE — Telephone Encounter (Signed)
Message left on my machine to let her know the status of her Dexcom prescription.  I left her a message to call her mail order pharmacy for the status of this order that was placed on 08/06/20.

## 2021-01-30 ENCOUNTER — Telehealth: Payer: Self-pay | Admitting: Dietician

## 2021-01-31 ENCOUNTER — Other Ambulatory Visit: Payer: Self-pay | Admitting: *Deleted

## 2021-01-31 NOTE — Telephone Encounter (Signed)
Start PA--Key: Marveen Reeks DEXCOM G6 Sensor.

## 2021-02-03 DIAGNOSIS — Z961 Presence of intraocular lens: Secondary | ICD-10-CM | POA: Diagnosis not present

## 2021-02-03 DIAGNOSIS — H2701 Aphakia, right eye: Secondary | ICD-10-CM | POA: Diagnosis not present

## 2021-02-03 DIAGNOSIS — H4020X0 Unspecified primary angle-closure glaucoma, stage unspecified: Secondary | ICD-10-CM | POA: Diagnosis not present

## 2021-02-03 DIAGNOSIS — E103552 Type 1 diabetes mellitus with stable proliferative diabetic retinopathy, left eye: Secondary | ICD-10-CM | POA: Diagnosis not present

## 2021-02-04 ENCOUNTER — Ambulatory Visit: Payer: Self-pay

## 2021-02-04 NOTE — Telephone Encounter (Signed)
Please send above scrpt to [facility].  Thank you

## 2021-02-06 DIAGNOSIS — G4733 Obstructive sleep apnea (adult) (pediatric): Secondary | ICD-10-CM | POA: Diagnosis not present

## 2021-02-10 DIAGNOSIS — R110 Nausea: Secondary | ICD-10-CM | POA: Diagnosis not present

## 2021-02-10 DIAGNOSIS — M79671 Pain in right foot: Secondary | ICD-10-CM | POA: Diagnosis not present

## 2021-02-10 DIAGNOSIS — J449 Chronic obstructive pulmonary disease, unspecified: Secondary | ICD-10-CM | POA: Diagnosis not present

## 2021-02-10 DIAGNOSIS — F419 Anxiety disorder, unspecified: Secondary | ICD-10-CM | POA: Diagnosis not present

## 2021-02-10 DIAGNOSIS — I679 Cerebrovascular disease, unspecified: Secondary | ICD-10-CM | POA: Diagnosis not present

## 2021-02-10 DIAGNOSIS — E039 Hypothyroidism, unspecified: Secondary | ICD-10-CM | POA: Diagnosis not present

## 2021-02-10 DIAGNOSIS — G4733 Obstructive sleep apnea (adult) (pediatric): Secondary | ICD-10-CM | POA: Diagnosis not present

## 2021-02-10 DIAGNOSIS — I509 Heart failure, unspecified: Secondary | ICD-10-CM | POA: Diagnosis not present

## 2021-02-10 DIAGNOSIS — N184 Chronic kidney disease, stage 4 (severe): Secondary | ICD-10-CM | POA: Diagnosis not present

## 2021-02-12 DIAGNOSIS — I5042 Chronic combined systolic (congestive) and diastolic (congestive) heart failure: Secondary | ICD-10-CM | POA: Diagnosis not present

## 2021-02-14 DIAGNOSIS — E1065 Type 1 diabetes mellitus with hyperglycemia: Secondary | ICD-10-CM | POA: Diagnosis not present

## 2021-02-14 DIAGNOSIS — E1022 Type 1 diabetes mellitus with diabetic chronic kidney disease: Secondary | ICD-10-CM | POA: Diagnosis not present

## 2021-03-04 ENCOUNTER — Encounter: Payer: Medicare HMO | Attending: Endocrinology | Admitting: Nutrition

## 2021-03-04 ENCOUNTER — Other Ambulatory Visit: Payer: Self-pay

## 2021-03-04 DIAGNOSIS — E109 Type 1 diabetes mellitus without complications: Secondary | ICD-10-CM | POA: Insufficient documentation

## 2021-03-04 NOTE — Progress Notes (Signed)
Patient and mother are here to learn/start the Tandem insulin pump.  Pt. Reports that she does not have any Dexcoms because her insurance is waiting for a LOM from Dr. Dwyane Dee.  She was given one Lot #: [id number]  Exp.: 2/23., and inserted into her right abdomen.  This was linked to her pump and to [place]. Settings were put in per Dr. Ronnie Derby orders:  Basal rate: 0.45. ISF: 70, I/C:1 (pt. Not counting carbs:  Will give boluses of 8 acB, 6acl and acS per Dr. Ronnie Derby voice order.  HS snacking: 2-3u.) Control IQ was turned on and patient re demonstrated how to bolus X2 correctly using a magnifying glass for added visual clarity.  Her blood sugar was now 248 without lunch and she did a correction dose of 1.26u of insulin at 2:45PM.   She reports having watched several U-tube videos about how to fill the cartridge and attach the infusion set.  Her mother filled the cartridge and attached the autosoft 90 2m infusion set into her left abdomen.   This pump was started at 2:30 and she was show how to disconnect from the infusion set and how to put the pump in stop/run, and she reported good understanding of all of the above.  She had no final questions. I will see her tomorrow to finish the training.

## 2021-03-04 NOTE — Patient Instructions (Signed)
Read over handouts given on how to start/stop pump, how to fill a cartridge, how to give a bolus. Call Tandem help line if questions.

## 2021-03-05 ENCOUNTER — Other Ambulatory Visit: Payer: Self-pay

## 2021-03-05 ENCOUNTER — Encounter: Payer: Self-pay | Admitting: Endocrinology

## 2021-03-05 ENCOUNTER — Ambulatory Visit: Payer: Medicare HMO | Admitting: Nutrition

## 2021-03-05 ENCOUNTER — Ambulatory Visit (INDEPENDENT_AMBULATORY_CARE_PROVIDER_SITE_OTHER): Payer: Medicare HMO | Admitting: Endocrinology

## 2021-03-05 VITALS — BP 134/84 | HR 67 | Ht 62.0 in | Wt 188.8 lb

## 2021-03-05 DIAGNOSIS — E1065 Type 1 diabetes mellitus with hyperglycemia: Secondary | ICD-10-CM | POA: Diagnosis not present

## 2021-03-05 LAB — POCT GLYCOSYLATED HEMOGLOBIN (HGB A1C): Hemoglobin A1C: 6.9 % — AB (ref 4.0–5.6)

## 2021-03-05 NOTE — Progress Notes (Signed)
Patient ID: Deanna Landry, female   DOB: Jan 08, 1971, 50 y.o.   MRN: [id number]           Reason for Appointment : Follow-up for Type 1 Diabetes  History of Present Illness   Referring HCP:Lee, Keung         Diagnosis: Type 1 diabetes mellitus, date of diagnosis: Age 13         Previous history:   She has been on insulin since her diagnosis when she had significant symptomatic hypoglycemia. Has been on various types of insulin regimens in the past but usually not followed by endocrinologist The last 5 or 6 years she has been on Levemir and NovoLog Previous level of control is variable with A1c only inconsistently below 7 BASAL insulin doses previously: 14 units Tresiba daily  Recent history:   She is on the T-slim pump started on/19/22   Basal rate: 0.45. ISF: 70, fixed boluses of 8 acB, 6acl and acS HS snacking: 2-3u.)  Non-insulin hypoglycemic drugs: Trulicity 1.5 mg weekly  A1c is 6.9  Current management, blood sugar patterns and problems identified:    She was finally able to start her insulin pump  Previous readings were not compared and she did not bring her freestyle libre but she thinks her blood sugars previously were fairly good without excessive hypoglycemia  She also may have lost some weight previously from acute illness but has recovered  Currently on the information from her Dexcom however last pump was not downloaded  Data from yesterday shows blood sugars to be in the low 200 range prior to dinnertime, going up about 60 mg after dinner to around 290 and subsequently coming down to about 150 at midnight  She took 8 units bolus since she had a relatively higher fat meal and a small amount of sweet tea with dinner  Blood sugars were rising through the night and running between 180-250  Overall average for the last 24 hours is 213  She has no difficulty doing the bolus on her own and it is currently in the control IQ mode   Previous data:  CGM use % of  time  64  2-week average/GV  140/49  Time in range       61% was 55  % Time Above 180  17  % Time above 250 7  % Time Below 70  15     PRE-MEAL Fasting Lunch Dinner Bedtime Overall  Glucose range:       Averages:  146  128  122  157  140   POST-MEAL PC Breakfast PC Lunch PC Dinner  Glucose range:     Averages:  157  138  146     Hypoglycemia:  occurs mostly overnight Factors causing hyperglycemia: Excessive Levemir at night Symptoms of hypoglycemia:none or minimal Treatment of hypoglycemia: Juices, if patient is unconscious mother will call the ambulance         Self-care: The diet that the patient has been following is: None, does not know carbohydrate counting Immediately skipping breakfast, lunch will be assignments, dinner usually consists of bread or pasta Snacks will be peanut butter crackers, yogurt, cottage cheese, fruit Eating out only about once or twice a month   Mealtimes are: Breakfast none Lunch: 1 pm Dinner: 6pm         Exercise: None          Dietician consultation none.          Wt Readings from  Last 3 Encounters:  03/05/21 188 lb 12.8 oz (85.6 kg)  11/30/20 183 lb 6.8 oz (83.2 kg)  11/11/20 185 lb 12.8 oz (84.3 kg)    Diabetes labs:  Lab Results  Component Value Date   HGBA1C 6.9 (H) 11/30/2020   HGBA1C 6.6 (A) 11/11/2020   HGBA1C 7.5 08/08/2020   Lab Results  Component Value Date   CREATININE 2.13 (H) 12/04/2020    No results found for: MICRALBCREAT   Allergies as of 03/05/2021   No Known Allergies     Medication List       Accurate as of Lettie 20, 2022 11:14 AM. If you have any questions, ask your nurse or doctor.        albuterol 108 (90 Base) MCG/ACT inhaler Commonly known as: VENTOLIN HFA Inhale 1-2 puffs into the lungs every 6 (six) hours as needed for wheezing or shortness of breath.   amLODipine 10 MG tablet Commonly known as: NORVASC Take 10 mg by mouth daily. Take 1/2 tablet daily   aspirin EC 81 MG tablet Take 81  mg by mouth daily.   atorvastatin 80 MG tablet Commonly known as: LIPITOR Take 80 mg by mouth daily.   benzonatate 100 MG capsule Commonly known as: Tessalon Perles Take 1 capsule (100 mg total) by mouth 3 (three) times daily as needed for cough.   buPROPion 300 MG 24 hr tablet Commonly known as: WELLBUTRIN XL Take 300 mg by mouth daily.   clopidogrel 75 MG tablet Commonly known as: PLAVIX Take 75 mg by mouth daily.   Dexcom G6 Receiver Devi Continuous glucose monitoring   Dexcom G6 Sensor Misc Apply new sensor every 10 days   Dexcom G6 Transmitter Misc Replace every 3 months   Easy Comfort Pen Needles 31G X 8 MM Misc Generic drug: Insulin Pen Needle Check sugar 3x  daily   glucose blood test strip Use as instructed   Gvoke HypoPen 2-Pack 1 MG/0.2ML Soaj Generic drug: Glucagon Inject 1 mg into the skin as needed. For severe lows What changed: reasons to take this   HYDROcodone-acetaminophen 5-325 MG tablet Commonly known as: NORCO/VICODIN Take 1 tablet by mouth every 8 (eight) hours.   isosorbide dinitrate 20 MG tablet Commonly known as: ISORDIL Take 20 mg by mouth 2 (two) times daily.   lactulose 10 GM/15ML solution Commonly known as: CHRONULAC Take 30 mLs (20 g total) by mouth 2 (two) times daily as needed for mild constipation.   levothyroxine 50 MCG tablet Commonly known as: SYNTHROID Take 50 mcg by mouth daily before breakfast.   loratadine 10 MG tablet Commonly known as: CLARITIN Take 10 mg by mouth daily as needed for allergies.   methocarbamol 750 MG tablet Commonly known as: Robaxin-750 Take 1 tablet (750 mg total) by mouth every 6 (six) hours as needed for muscle spasms.   metoCLOPramide 5 MG tablet Commonly known as: REGLAN Take 1 tablet (5 mg total) by mouth 4 (four) times daily -  before meals and at bedtime.   metoprolol tartrate 25 MG tablet Commonly known as: LOPRESSOR Take 12.5 mg by mouth 2 (two) times daily.   NovoLOG 100  UNIT/ML injection Generic drug: insulin aspart Inject 150 Units into the skin once. With insulin pump   pantoprazole 40 MG tablet Commonly known as: PROTONIX Take 40 mg by mouth daily.   polyethylene glycol 17 g packet Commonly known as: MIRALAX / GLYCOLAX Take 17 g by mouth daily.   predniSONE 20 MG tablet Commonly known  as: DELTASONE Take 3 tablets once daily for 3 days followed by 2 tablets once daily for 3 days followed by 1 tablet once daily for 3 days and then stop   pregabalin 75 MG capsule Commonly known as: LYRICA Take 75 mg by mouth 2 (two) times daily.   ranolazine 500 MG 12 hr tablet Commonly known as: RANEXA TAKE 1 TABLET BY MOUTH TWICE A DAY   senna-docusate 8.6-50 MG tablet Commonly known as: Senokot-S Take 2 tablets by mouth 2 (two) times daily.   sertraline 100 MG tablet Commonly known as: ZOLOFT Take 100 mg by mouth daily.   Systane 0.4-0.3 % Gel ophthalmic gel Generic drug: Polyethyl Glycol-Propyl Glycol Place 1 application into both eyes 3 (three) times daily as needed (dry/irritated eyes.).   [name] FlexTouch 200 UNIT/ML FlexTouch Pen Generic drug: insulin degludec INJECT 30 UNITS INTO THE SKIN DAILY.   Trulicity 1.5 0000000 Sopn Generic drug: Dulaglutide Inject 1.5 mg into the skin every Monday.       Allergies: No Known Allergies  Past Medical History:  Diagnosis Date  . Acute kidney injury superimposed on CKD ([place]) 03/15/2019  . Acute respiratory failure with hypoxia ([place]) 03/15/2019  . Adjustment disorder with mixed anxiety and depressed mood 03/11/2019  . Anemia   . Anxiety   . Arthritis    right ankle  . Blind    right eye  . CHF (congestive heart failure) ([place])   . Closed right pilon fracture, initial encounter 07/26/2019  . COPD (chronic obstructive pulmonary disease) ([place])   . Coronary artery disease   . Depression   . Diabetes mellitus type 2 in obese ([place]) 07/26/2019  . Diabetes mellitus without complication ([place])    type 1   . Diabetic polyneuropathy ([place]) 05/04/2016  . Dyslipidemia 01/03/2020  . Fever 03/10/2019  . GERD (gastroesophageal reflux disease)   . History of blood transfusion   . HLD (hyperlipidemia)   . Hyperglycemia   . Hypertension   . Hypothyroidism   . Ileus, postoperative ([place])   . Major depression, recurrent ([place]) 03/09/2019  . MDD (major depressive disorder), severe ([place]) 03/09/2019  . Myocardial infarct, old    2020, s/p DES RCA, RPDA 10/23/19  . Neck pain 04/21/2016  . Pneumonia 10/2019  . Renal disorder    stage 3  . Sepsis ([place]) 03/10/2019  . Short-term memory loss    mild  . Sleep apnea    study done 9/27 and 08/13/20 - no results yet 08/21/20  . Stroke ([place])   . Suicidal ideation 03/15/2019  . Type 1 diabetes mellitus without complication ([place]) [id number]  . Wound infection after surgery (Right ankle) 10/04/2019    Past Surgical History:  Procedure Laterality Date  . ABDOMINAL HYSTERECTOMY  2014  . ANKLE FUSION Right 08/23/2020   Procedure: ANKLE FUSION;  Surgeon: Shona Needles, MD;  Location: [facility];  Service: Orthopedics;  Laterality: Right;  . APPLICATION OF WOUND VAC Right 10/11/2019   Procedure: WOUND VAC CHANGE TO RIGHT LEG;  Surgeon: Shona Needles, MD;  Location: [facility];  Service: Orthopedics;  Laterality: Right;  . CORONARY ANGIOPLASTY  10/23/2019   DES RCA, DES RPDA 10/23/19 ([place])  . EYE SURGERY    . HARDWARE REMOVAL Right 10/06/2019   Procedure: HARDWARE REMOVAL;  Surgeon: Shona Needles, MD;  Location: [facility];  Service: Orthopedics;  Laterality: Right;  . HARDWARE REMOVAL Right 08/23/2020   Procedure: HARDWARE REMOVAL;  Surgeon: Shona Needles, MD;  Location: [facility]  OR;  Service: Orthopedics;  Laterality: Right;  . I & D EXTREMITY Right 10/06/2019   Procedure: IRRIGATION AND DEBRIDEMENT EXTREMITY;  Surgeon: Shona Needles, MD;  Location: [facility];  Service: Orthopedics;  Laterality: Right;  . I & D EXTREMITY Right 10/09/2019   Procedure: IRRIGATION AND DEBRIDEMENT  EXTREMITY and WOUND VAC CHANGE RIGHT ANKLE;  Surgeon: Shona Needles, MD;  Location: [facility];  Service: Orthopedics;  Laterality: Right;  . ORIF ANKLE FRACTURE Right 07/26/2019   Procedure: OPEN REDUCTION INTERNAL FIXATION (ORIF) ANKLE FRACTURE;  Surgeon: Shona Needles, MD;  Location: [facility];  Service: Orthopedics;  Laterality: Right;  OPEN REDUCTION INTERNAL FIXATION (ORIF) ANKLE FRACTURE   . TOE AMPUTATION      Family History  Problem Relation Age of Onset  . Diabetes Mellitus II Mother   . Stroke Mother   . Heart attack Mother   . Stomach cancer Father   . Congestive Heart Failure Father   . Diabetes Sister   . Congestive Heart Failure Maternal Grandfather     Social History:  reports that she has never smoked. She has never used smokeless tobacco. She reports previous alcohol use. She reports previous drug use.      Review of Systems  HYPOTHYROIDISM: Treated by PCP with levothyroxine 50 mcg and last TSH 4.7 done in August      Lipids: Last LDL was 64 in June 2021 She is on a statin drug using 80 mg Lipitor  No results found for: CHOL, HDL, LDLCALC, LDLDIRECT, TRIG, CHOLHDL  Last dilated eye exam was in 3/21  Peripheral neuropathy: She will get some pains and paresthesia in her legs treated with Lyrica and tramadol.  Also has numbness in her hands  DIABETES COMPLICATIONS: Neuropathy, retinopathy nephropathy   CKD: Labs as follows, recently seen by nephrologist  Lab Results  Component Value Date   CREATININE 2.13 (H) 12/04/2020   CREATININE 2.06 (H) 12/03/2020   CREATININE 2.48 (H) 12/02/2020   Blood pressure being monitored by nephrologist  BP Readings from Last 3 Encounters:  12/04/20 (!) 117/57  11/11/20 (!) 150/90  08/23/20 134/79       Physical Examination:  Ht '5\' 2"'$  (1.575 m)   Wt 188 lb 12.8 oz (85.6 kg)   LMP  (LMP Unknown) Comment: hysterectomy 2014  BMI 34.53 kg/m     ASSESSMENT:  Diabetes type 1, on basal bolus insulin  A1c is  6.9  With starting the insulin pump yesterday her blood sugars are on an average running around 200 with mild increase after dinner yesterday She had stopped the [place] the night before    PLAN:   Recommendations Basal rate will be increased to 0.55 between midnight and 12 PM for now Sensitivity 1: 60 She will continue to use 6 to 8 units boluses for meals based on meal size and carbohydrates and continue to use fixed doses Follow-up tomorrow  There are no Patient Instructions on file for this visit.    Elayne Snare 03/05/2021, 11:14 AM      Note: This note was prepared with Dragon voice recognition system technology. Any transcriptional errors that result from this process are unintentional.

## 2021-03-05 NOTE — Patient Instructions (Signed)
Basal rate: 0.45. ISF: 70,

## 2021-03-06 ENCOUNTER — Ambulatory Visit (INDEPENDENT_AMBULATORY_CARE_PROVIDER_SITE_OTHER): Payer: Medicare HMO | Admitting: Endocrinology

## 2021-03-06 ENCOUNTER — Other Ambulatory Visit: Payer: Self-pay

## 2021-03-06 DIAGNOSIS — E1065 Type 1 diabetes mellitus with hyperglycemia: Secondary | ICD-10-CM

## 2021-03-06 DIAGNOSIS — I679 Cerebrovascular disease, unspecified: Secondary | ICD-10-CM | POA: Diagnosis not present

## 2021-03-06 DIAGNOSIS — J449 Chronic obstructive pulmonary disease, unspecified: Secondary | ICD-10-CM | POA: Diagnosis not present

## 2021-03-06 DIAGNOSIS — I509 Heart failure, unspecified: Secondary | ICD-10-CM | POA: Diagnosis not present

## 2021-03-06 DIAGNOSIS — G5603 Carpal tunnel syndrome, bilateral upper limbs: Secondary | ICD-10-CM | POA: Diagnosis not present

## 2021-03-06 DIAGNOSIS — G4733 Obstructive sleep apnea (adult) (pediatric): Secondary | ICD-10-CM | POA: Diagnosis not present

## 2021-03-06 DIAGNOSIS — M79671 Pain in right foot: Secondary | ICD-10-CM | POA: Diagnosis not present

## 2021-03-06 DIAGNOSIS — D649 Anemia, unspecified: Secondary | ICD-10-CM | POA: Diagnosis not present

## 2021-03-06 DIAGNOSIS — R110 Nausea: Secondary | ICD-10-CM | POA: Diagnosis not present

## 2021-03-06 DIAGNOSIS — N184 Chronic kidney disease, stage 4 (severe): Secondary | ICD-10-CM | POA: Diagnosis not present

## 2021-03-06 NOTE — Patient Instructions (Addendum)
Read over handouts given and call if questions Call Tandem help line if questions about pump usage.  Change infusion set and cartridge every 3 days. Fill cartridge with 160u q 3 days.

## 2021-03-06 NOTE — Progress Notes (Addendum)
Patient reported no difficulty giving boluses, and admits she is using her magnifying glass when doing this.  She also admits to confirming correct input of units of insulin before pressing start button for bolus delivery. We reviewed all topics we discussed yesterday:  How to give a bolus, how to insert the sensor and transmitter, and how to respond to alerts and alarms, and how to change the cartridge and infusion sets.  Both she and her mother reported good understanding of these topics. We also reviewed sick day guidelines, high blood sugar and low blood sugar protocols, as well as how to make changes to her pump settins. Handouts were given on both of these topics.  She was encouraged to review these when she gets home.  She agreed to do this.   She could not make changes to her pump settings per dr. Ronnie Derby orders.  She was shown again how to do this.  New basal setting:  MN: 0.55, 12PM: 0.45.  ISF: 60. We revewed all topics on pump checklist and she signed the list acknowledging that she understands all topics with no final questions.

## 2021-03-06 NOTE — Progress Notes (Addendum)
Patient ID: Deanna Landry, female   DOB: [birth year], 50 y.o.   MRN: [id number]           Reason for Appointment : Follow-up for Type 1 Diabetes  History of Present Illness   Referring HCP:Lee, Keung         Diagnosis: Type 1 diabetes mellitus, date of diagnosis: Age 43         Previous history:   She has been on insulin since her diagnosis when she had significant symptomatic hypoglycemia. Has been on various types of insulin regimens in the past but usually not followed by endocrinologist The last 5 or 6 years she has been on Levemir and NovoLog Previous level of control is variable with A1c only inconsistently below 7 BASAL insulin doses previously: 14 units Tresiba daily  Recent history:   She is on the T-slim pump started on 03/04/21   Basal rate: 0. 12 AM-12 PM and then 0.45. ISF: 1: 60, fixed boluses of 8 acB, 6acl and acS HS snacking: 2-3u.  Non-insulin hypoglycemic drugs: Trulicity 1.5 mg weekly  A1c is 6.9  Current management, blood sugar patterns and problems identified:    She is seen for follow-up on the second day of her pump  She is having difficulty understanding the instructions for her boluses or doing them as directed  Yesterday she had hotdogs and Oreo cookies for lunch and did not take any bolus with subsequent blood sugar over 400  She does not understand the need to correction boluses when blood sugars are high  Yesterday evening she had a relatively low carbohydrate meal with cheese and took her bolus of 3 units after starting to eat  Subsequently her blood sugar was low normal around 9 PM  Blood sugar however appears to be rising during the night until at least 8 AM  Today with breakfast when she had [place] toast, potatoes and coffee with creamer if he only took 3 units bolus with blood sugar going up over 300  No hypoglycemia   Previous data:  CGM use % of time  64  2-week average/GV  140/49  Time in range       61% was 55  % Time Above  180  17  % Time above 250 7  % Time Below 70  15     PRE-MEAL Fasting Lunch Dinner Bedtime Overall  Glucose range:       Averages:  146  128  122  157  140   POST-MEAL PC Breakfast PC Lunch PC Dinner  Glucose range:     Averages:  157  138  146     Hypoglycemia:  occurs mostly overnight Factors causing hyperglycemia: Excessive Levemir at night Symptoms of hypoglycemia:none or minimal Treatment of hypoglycemia: Juices, if patient is unconscious mother will call the ambulance         Self-care: The diet that the patient has been following is: None, does not know carbohydrate counting Immediately skipping breakfast, lunch will be assignments, dinner usually consists of bread or pasta Snacks will be peanut butter crackers, yogurt, cottage cheese, fruit Eating out only about once or twice a month   Mealtimes are: Breakfast none Lunch: 1 pm Dinner: 6pm         Exercise: None          Dietician consultation none.          Wt Readings from Last 3 Encounters:  03/05/21 188 lb 12.8 oz (85.6 kg)  11/30/20 183 lb 6.8 oz (83.2 kg)  11/11/20 185 lb 12.8 oz (84.3 kg)    Diabetes labs:  Lab Results  Component Value Date   HGBA1C 6.9 (A) 03/05/2021   HGBA1C 6.9 (H) 11/30/2020   HGBA1C 6.6 (A) 11/11/2020   Lab Results  Component Value Date   CREATININE 2.13 (H) 12/04/2020    No results found for: MICRALBCREAT   Allergies as of 03/06/2021   No Known Allergies     Medication List       Accurate as of Jeneal 21, 2022  1:05 PM. If you have any questions, ask your nurse or doctor.        albuterol 108 (90 Base) MCG/ACT inhaler Commonly known as: VENTOLIN HFA Inhale 1-2 puffs into the lungs every 6 (six) hours as needed for wheezing or shortness of breath.   amLODipine 10 MG tablet Commonly known as: NORVASC Take 10 mg by mouth daily. Take 1/2 tablet daily   aspirin EC 81 MG tablet Take 81 mg by mouth daily.   atorvastatin 80 MG tablet Commonly known as: LIPITOR Take  80 mg by mouth daily.   benzonatate 100 MG capsule Commonly known as: Tessalon Perles Take 1 capsule (100 mg total) by mouth 3 (three) times daily as needed for cough.   buPROPion 300 MG 24 hr tablet Commonly known as: WELLBUTRIN XL Take 300 mg by mouth daily.   clopidogrel 75 MG tablet Commonly known as: PLAVIX Take 75 mg by mouth daily.   Dexcom G6 Receiver Devi Continuous glucose monitoring   Dexcom G6 Sensor Misc Apply new sensor every 10 days   Dexcom G6 Transmitter Misc Replace every 3 months   Easy Comfort Pen Needles 31G X 8 MM Misc Generic drug: Insulin Pen Needle Check sugar 3x  daily   glucose blood test strip Use as instructed   Gvoke HypoPen 2-Pack 1 MG/0.2ML Soaj Generic drug: Glucagon Inject 1 mg into the skin as needed. For severe lows What changed: reasons to take this   HYDROcodone-acetaminophen 5-325 MG tablet Commonly known as: NORCO/VICODIN Take 1 tablet by mouth every 8 (eight) hours.   isosorbide dinitrate 20 MG tablet Commonly known as: ISORDIL Take 20 mg by mouth 2 (two) times daily.   lactulose 10 GM/15ML solution Commonly known as: CHRONULAC Take 30 mLs (20 g total) by mouth 2 (two) times daily as needed for mild constipation.   levothyroxine 50 MCG tablet Commonly known as: SYNTHROID Take 50 mcg by mouth daily before breakfast.   loratadine 10 MG tablet Commonly known as: CLARITIN Take 10 mg by mouth daily as needed for allergies.   methocarbamol 750 MG tablet Commonly known as: Robaxin-750 Take 1 tablet (750 mg total) by mouth every 6 (six) hours as needed for muscle spasms.   metoCLOPramide 5 MG tablet Commonly known as: REGLAN Take 1 tablet (5 mg total) by mouth 4 (four) times daily -  before meals and at bedtime.   metoprolol tartrate 25 MG tablet Commonly known as: LOPRESSOR Take 12.5 mg by mouth 2 (two) times daily.   NovoLOG 100 UNIT/ML injection Generic drug: insulin aspart Inject 150 Units into the skin once.  With insulin pump   pantoprazole 40 MG tablet Commonly known as: PROTONIX Take 40 mg by mouth daily.   polyethylene glycol 17 g packet Commonly known as: MIRALAX / GLYCOLAX Take 17 g by mouth daily.   predniSONE 20 MG tablet Commonly known as: DELTASONE Take 3 tablets once daily for 3 days  followed by 2 tablets once daily for 3 days followed by 1 tablet once daily for 3 days and then stop   pregabalin 75 MG capsule Commonly known as: LYRICA Take 75 mg by mouth 2 (two) times daily.   ranolazine 500 MG 12 hr tablet Commonly known as: RANEXA TAKE 1 TABLET BY MOUTH TWICE A DAY   senna-docusate 8.6-50 MG tablet Commonly known as: Senokot-S Take 2 tablets by mouth 2 (two) times daily.   sertraline 100 MG tablet Commonly known as: ZOLOFT Take 100 mg by mouth daily.   Systane 0.4-0.3 % Gel ophthalmic gel Generic drug: Polyethyl Glycol-Propyl Glycol Place 1 application into both eyes 3 (three) times daily as needed (dry/irritated eyes.).   [name] FlexTouch 200 UNIT/ML FlexTouch Pen Generic drug: insulin degludec INJECT 30 UNITS INTO THE SKIN DAILY.   Trulicity 1.5 0000000 Sopn Generic drug: Dulaglutide Inject 1.5 mg into the skin every Monday.       Allergies: No Known Allergies  Past Medical History:  Diagnosis Date  . Acute kidney injury superimposed on CKD ([place]) 03/15/2019  . Acute respiratory failure with hypoxia ([place]) 03/15/2019  . Adjustment disorder with mixed anxiety and depressed mood 03/11/2019  . Anemia   . Anxiety   . Arthritis    right ankle  . Blind    right eye  . CHF (congestive heart failure) ([place])   . Closed right pilon fracture, initial encounter 07/26/2019  . COPD (chronic obstructive pulmonary disease) ([place])   . Coronary artery disease   . Depression   . Diabetes mellitus type 2 in obese ([place]) 07/26/2019  . Diabetes mellitus without complication ([place])    type 1  . Diabetic polyneuropathy ([place]) 05/04/2016  . Dyslipidemia 01/03/2020  . Fever  03/10/2019  . GERD (gastroesophageal reflux disease)   . History of blood transfusion   . HLD (hyperlipidemia)   . Hyperglycemia   . Hypertension   . Hypothyroidism   . Ileus, postoperative ([place])   . Major depression, recurrent ([place]) 03/09/2019  . MDD (major depressive disorder), severe ([place]) 03/09/2019  . Myocardial infarct, old    2020, s/p DES RCA, RPDA 10/23/19  . Neck pain 04/21/2016  . Pneumonia 10/2019  . Renal disorder    stage 3  . Sepsis ([place]) 03/10/2019  . Short-term memory loss    mild  . Sleep apnea    study done 9/27 and 08/13/20 - no results yet 08/21/20  . Stroke ([place])   . Suicidal ideation 03/15/2019  . Type 1 diabetes mellitus without complication ([place]) [id number]  . Wound infection after surgery (Right ankle) 10/04/2019    Past Surgical History:  Procedure Laterality Date  . ABDOMINAL HYSTERECTOMY  2014  . ANKLE FUSION Right 08/23/2020   Procedure: ANKLE FUSION;  Surgeon: Shona Needles, MD;  Location: [facility];  Service: Orthopedics;  Laterality: Right;  . APPLICATION OF WOUND VAC Right 10/11/2019   Procedure: WOUND VAC CHANGE TO RIGHT LEG;  Surgeon: Shona Needles, MD;  Location: [facility];  Service: Orthopedics;  Laterality: Right;  . CORONARY ANGIOPLASTY  10/23/2019   DES RCA, DES RPDA 10/23/19 ([place])  . EYE SURGERY    . HARDWARE REMOVAL Right 10/06/2019   Procedure: HARDWARE REMOVAL;  Surgeon: Shona Needles, MD;  Location: [facility];  Service: Orthopedics;  Laterality: Right;  . HARDWARE REMOVAL Right 08/23/2020   Procedure: HARDWARE REMOVAL;  Surgeon: Shona Needles, MD;  Location: [place] [facility];  Service: Orthopedics;  Laterality: Right;  . I &  D EXTREMITY Right 10/06/2019   Procedure: IRRIGATION AND DEBRIDEMENT EXTREMITY;  Surgeon: Shona Needles, MD;  Location: [facility];  Service: Orthopedics;  Laterality: Right;  . I & D EXTREMITY Right 10/09/2019   Procedure: IRRIGATION AND DEBRIDEMENT EXTREMITY and WOUND VAC CHANGE RIGHT ANKLE;  Surgeon: Shona Needles, MD;  Location: [facility];  Service: Orthopedics;  Laterality: Right;  . ORIF ANKLE FRACTURE Right 07/26/2019   Procedure: OPEN REDUCTION INTERNAL FIXATION (ORIF) ANKLE FRACTURE;  Surgeon: Shona Needles, MD;  Location: [facility];  Service: Orthopedics;  Laterality: Right;  OPEN REDUCTION INTERNAL FIXATION (ORIF) ANKLE FRACTURE   . TOE AMPUTATION      Family History  Problem Relation Age of Onset  . Diabetes Mellitus II Mother   . Stroke Mother   . Heart attack Mother   . Stomach cancer Father   . Congestive Heart Failure Father   . Diabetes Sister   . Congestive Heart Failure Maternal Grandfather     Social History:  reports that she has never smoked. She has never used smokeless tobacco. She reports previous alcohol use. She reports previous drug use.      Review of Systems  The following is a copy of the previous note  HYPOTHYROIDISM: Treated by PCP with levothyroxine 50 mcg and last TSH 4.7 done in August      Lipids: Last LDL was 64 in June 2021 She is on a statin drug using 80 mg Lipitor  No results found for: CHOL, HDL, LDLCALC, LDLDIRECT, TRIG, CHOLHDL  Last dilated eye exam was in 3/21  Peripheral neuropathy: She will get some pains and paresthesia in her legs treated with Lyrica and tramadol.  Also has numbness in her hands  DIABETES COMPLICATIONS: Neuropathy, retinopathy nephropathy   CKD: Labs as follows, recently seen by nephrologist  Lab Results  Component Value Date   CREATININE 2.13 (H) 12/04/2020   CREATININE 2.06 (H) 12/03/2020   CREATININE 2.48 (H) 12/02/2020   Blood pressure being monitored by nephrologist  BP Readings from Last 3 Encounters:  03/05/21 134/84  12/04/20 (!) 117/57  11/11/20 (!) 150/90       Physical Examination:  LMP  (LMP Unknown) Comment: hysterectomy 2014    ASSESSMENT:  Diabetes type 1, on basal bolus insulin  A1c is now 6.9  With starting the insulin pump 2 days ago her blood sugar patterns are as discussed above  With increasing  her basal rate overnight her blood sugars are somewhat better but still rising during the night As discussed above she has not bolused appropriately and is still not able to count carbohydrates She appears to understand the concept of carbohydrate exchanges better than actual g of carbohydrate Pump is currently set for 1: 1 carbohydrate ratio  PLAN:   Recommendations Basal rate will be increased to 0. 6 0 between midnight and 8 AM and 0.5 until midnight She will estimate her carbohydrate exchanges and bolus 2 units for each carbohydrate exchange She will add another carbohydrate unit for any high fat meals, coffee with creamer Make sure she boluses to correct high readings and this was done in the office under supervision today with blood sugar 301 Discussed timing of boluses  Follow-up on Monday, will call if she has any difficulties before that  Patient Instructions  Take 2 units per carb exchange  Bolus before starting to eat    Total visit time including counseling: 30 minutes  Morell Mears 03/06/2021, 1:05 PM  Note: This note was prepared with Estate agent. Any transcriptional errors that result from this process are unintentional.

## 2021-03-06 NOTE — Patient Instructions (Signed)
Take 2 units per carb exchange  Bolus before starting to eat

## 2021-03-09 DIAGNOSIS — G4733 Obstructive sleep apnea (adult) (pediatric): Secondary | ICD-10-CM | POA: Diagnosis not present

## 2021-03-10 ENCOUNTER — Encounter: Payer: Medicare HMO | Admitting: Endocrinology

## 2021-03-10 ENCOUNTER — Other Ambulatory Visit: Payer: Self-pay

## 2021-03-10 NOTE — Progress Notes (Signed)
This encounter was created in error - please disregard.

## 2021-03-11 ENCOUNTER — Telehealth: Payer: Self-pay | Admitting: Nutrition

## 2021-03-11 NOTE — Telephone Encounter (Signed)
PA started for [facility] and not clear what more needs to be done or if a letter needs to be faxed

## 2021-03-11 NOTE — Telephone Encounter (Signed)
Tried callling pt. X3 today,but not able to leave voice mail.  Called mother's phone and left message to have patient call me tomorrow.

## 2021-03-11 NOTE — Telephone Encounter (Signed)
Patient is wondering if the appeal was faxed to her insurance for approval of the Dexcom.  This goes with her pump to calculate an insulin dose.  Pt. Said she appealed the denial, and was wondering if the paperwork got sent back to [facility].

## 2021-03-12 NOTE — Telephone Encounter (Signed)
I just checked cover my meds and it's still pending with her insurance for approval.

## 2021-03-12 NOTE — Telephone Encounter (Signed)
Message left of this on her mother's voice mail.

## 2021-03-14 DIAGNOSIS — N184 Chronic kidney disease, stage 4 (severe): Secondary | ICD-10-CM | POA: Diagnosis not present

## 2021-03-14 DIAGNOSIS — E785 Hyperlipidemia, unspecified: Secondary | ICD-10-CM | POA: Diagnosis not present

## 2021-03-14 DIAGNOSIS — I10 Essential (primary) hypertension: Secondary | ICD-10-CM | POA: Diagnosis not present

## 2021-03-14 DIAGNOSIS — E039 Hypothyroidism, unspecified: Secondary | ICD-10-CM | POA: Diagnosis not present

## 2021-03-14 DIAGNOSIS — R110 Nausea: Secondary | ICD-10-CM | POA: Diagnosis not present

## 2021-03-14 DIAGNOSIS — G5603 Carpal tunnel syndrome, bilateral upper limbs: Secondary | ICD-10-CM | POA: Diagnosis not present

## 2021-03-14 DIAGNOSIS — E1121 Type 2 diabetes mellitus with diabetic nephropathy: Secondary | ICD-10-CM | POA: Diagnosis not present

## 2021-03-14 DIAGNOSIS — G4733 Obstructive sleep apnea (adult) (pediatric): Secondary | ICD-10-CM | POA: Diagnosis not present

## 2021-03-14 DIAGNOSIS — I679 Cerebrovascular disease, unspecified: Secondary | ICD-10-CM | POA: Diagnosis not present

## 2021-03-14 DIAGNOSIS — M79671 Pain in right foot: Secondary | ICD-10-CM | POA: Diagnosis not present

## 2021-03-14 DIAGNOSIS — J449 Chronic obstructive pulmonary disease, unspecified: Secondary | ICD-10-CM | POA: Diagnosis not present

## 2021-03-14 DIAGNOSIS — I509 Heart failure, unspecified: Secondary | ICD-10-CM | POA: Diagnosis not present

## 2021-03-15 DIAGNOSIS — I5042 Chronic combined systolic (congestive) and diastolic (congestive) heart failure: Secondary | ICD-10-CM | POA: Diagnosis not present

## 2021-03-16 DIAGNOSIS — E1065 Type 1 diabetes mellitus with hyperglycemia: Secondary | ICD-10-CM | POA: Diagnosis not present

## 2021-03-16 DIAGNOSIS — E1022 Type 1 diabetes mellitus with diabetic chronic kidney disease: Secondary | ICD-10-CM | POA: Diagnosis not present

## 2021-03-17 ENCOUNTER — Other Ambulatory Visit: Payer: Self-pay

## 2021-03-17 ENCOUNTER — Encounter: Payer: Medicare HMO | Admitting: Endocrinology

## 2021-03-17 NOTE — Progress Notes (Signed)
This encounter was created in error - please disregard.

## 2021-03-18 ENCOUNTER — Telehealth: Payer: Self-pay | Admitting: Nutrition

## 2021-03-18 ENCOUNTER — Other Ambulatory Visit: Payer: Self-pay | Admitting: *Deleted

## 2021-03-18 MED ORDER — DEXCOM G6 TRANSMITTER MISC: 3 refills | Status: DC

## 2021-03-18 MED ORDER — DEXCOM G6 SENSOR MISC: 6 refills | Status: DC

## 2021-03-18 MED ORDER — DEXCOM G6 RECEIVER DEVI: 0 refills | Status: DC

## 2021-03-18 NOTE — Telephone Encounter (Signed)
Patient says she is getting her Libres through [facility]. And they told her that they can get her the Dexcom, just to send a script with a" POW", and they will fill it.

## 2021-03-18 NOTE — Telephone Encounter (Signed)
On your desk for signature

## 2021-03-19 DIAGNOSIS — D631 Anemia in chronic kidney disease: Secondary | ICD-10-CM | POA: Diagnosis not present

## 2021-03-19 DIAGNOSIS — N2581 Secondary hyperparathyroidism of renal origin: Secondary | ICD-10-CM | POA: Diagnosis not present

## 2021-03-19 DIAGNOSIS — N189 Chronic kidney disease, unspecified: Secondary | ICD-10-CM | POA: Diagnosis not present

## 2021-03-19 DIAGNOSIS — N184 Chronic kidney disease, stage 4 (severe): Secondary | ICD-10-CM | POA: Diagnosis not present

## 2021-03-19 DIAGNOSIS — R809 Proteinuria, unspecified: Secondary | ICD-10-CM | POA: Diagnosis not present

## 2021-03-19 DIAGNOSIS — N281 Cyst of kidney, acquired: Secondary | ICD-10-CM | POA: Diagnosis not present

## 2021-03-19 DIAGNOSIS — E109 Type 1 diabetes mellitus without complications: Secondary | ICD-10-CM | POA: Diagnosis not present

## 2021-03-19 DIAGNOSIS — I129 Hypertensive chronic kidney disease with stage 1 through stage 4 chronic kidney disease, or unspecified chronic kidney disease: Secondary | ICD-10-CM | POA: Diagnosis not present

## 2021-03-20 ENCOUNTER — Other Ambulatory Visit: Payer: Self-pay

## 2021-03-20 ENCOUNTER — Ambulatory Visit (INDEPENDENT_AMBULATORY_CARE_PROVIDER_SITE_OTHER): Payer: Medicare HMO | Admitting: Endocrinology

## 2021-03-20 VITALS — BP 140/78 | HR 71 | Ht 62.0 in | Wt 184.4 lb

## 2021-03-20 DIAGNOSIS — E1065 Type 1 diabetes mellitus with hyperglycemia: Secondary | ICD-10-CM | POA: Diagnosis not present

## 2021-03-20 NOTE — Progress Notes (Signed)
Patient ID: Deanna Landry, female   DOB: 1971/01/29, 50 y.o.   MRN: [id number]           Reason for Appointment : Follow-up for Type 1 Diabetes  History of Present Illness   Referring HCP:Lee, Keung         Diagnosis: Type 1 diabetes mellitus, date of diagnosis: Age 68         Previous history:   She has been on insulin since her diagnosis when she had significant symptomatic hypoglycemia. Has been on various types of insulin regimens in the past but usually not followed by endocrinologist The last 5 or 6 years she has been on Levemir and NovoLog Previous level of control is variable with A1c only inconsistently below 7 BASAL insulin doses previously: 14 units Tresiba daily  Recent history:   She is on the T-slim pump started on 03/04/21   Basal rate: 0. 12 AM-12 PM and then 0.45. ISF: 1: 60, fixed boluses of 8 acB, 6acl and acS HS snacking: 2-3u.  Non-insulin hypoglycemic drugs: Trulicity 1.5 mg weekly  A1c is recently 6.9  Current management, blood sugar patterns and problems identified:    She is now doing better with taking her boluses for her meals as well as some correction boluses  Since her blood sugars are generally higher overnight she has been started on the sleep mode at bedtime  Although she had a few severe hyperglycemic episodes in the first few days of starting the pump she has much more stable control in the last week  Variability in her blood sugars is improving and standard deviation for the last week is only 34  She is usually not eating more than 1 meal a day at the most 2 meals and most of these meals are later in the evening  With her mother's help she is trying to do better with estimating her carbohydrate but occasionally may underestimate this.  Also will tend to have higher readings with higher fat meals  Prior authorization for her Dexcom sensor may be still pending with her insurance  No significant hypoglycemia, may have had a transient low  sugar after overestimating a bolus once in the afternoon    CGM use % of time  97  2-week average/SD  160+/-34  Time in range    74    %  % Time Above 180  26  % Time above 250  74  % Time Below 70  0      PRE-MEAL  overnight  mornings  afternoon  evening Overall  Glucose range:       Averages:  153  158  173  159      Previous data:  CGM use % of time  64  2-week average/GV  140/49  Time in range       61% was 55  % Time Above 180  17  % Time above 250 7  % Time Below 70  15     PRE-MEAL Fasting Lunch Dinner Bedtime Overall  Glucose range:       Averages:  146  128  122  157  140   POST-MEAL PC Breakfast PC Lunch PC Dinner  Glucose range:     Averages:  157  138  146     Hypoglycemia:  occurs mostly overnight Factors causing hyperglycemia: Excessive Levemir at night Symptoms of hypoglycemia:none or minimal Treatment of hypoglycemia: Juices, if patient is unconscious mother will call the ambulance  Self-care: The diet that the patient has been following is: None, does not know carbohydrate counting Immediately skipping breakfast, lunch will be assignments, dinner usually consists of bread or pasta Snacks will be peanut butter crackers, yogurt, cottage cheese, fruit Eating out only about once or twice a month   Mealtimes are: Breakfast none Lunch: 1 pm Dinner: 6pm         Exercise: None          Dietician consultation none.          Wt Readings from Last 3 Encounters:  03/20/21 184 lb 6.4 oz (83.6 kg)  03/05/21 188 lb 12.8 oz (85.6 kg)  11/30/20 183 lb 6.8 oz (83.2 kg)    Diabetes labs:  Lab Results  Component Value Date   HGBA1C 6.9 (A) 03/05/2021   HGBA1C 6.9 (H) 11/30/2020   HGBA1C 6.6 (A) 11/11/2020   Lab Results  Component Value Date   CREATININE 2.13 (H) 12/04/2020    No results found for: MICRALBCREAT   Allergies as of 03/20/2021   No Known Allergies     Medication List       Accurate as of Mar 20, 2021  1:22 PM. If you have  any questions, ask your nurse or doctor.        albuterol 108 (90 Base) MCG/ACT inhaler Commonly known as: VENTOLIN HFA Inhale 1-2 puffs into the lungs every 6 (six) hours as needed for wheezing or shortness of breath.   amLODipine 10 MG tablet Commonly known as: NORVASC Take 10 mg by mouth daily. Take 1/2 tablet daily   aspirin EC 81 MG tablet Take 81 mg by mouth daily.   atorvastatin 80 MG tablet Commonly known as: LIPITOR Take 80 mg by mouth daily.   benzonatate 100 MG capsule Commonly known as: Tessalon Perles Take 1 capsule (100 mg total) by mouth 3 (three) times daily as needed for cough.   buPROPion 300 MG 24 hr tablet Commonly known as: WELLBUTRIN XL Take 300 mg by mouth daily.   clopidogrel 75 MG tablet Commonly known as: PLAVIX Take 75 mg by mouth daily.   Dexcom G6 Receiver Devi Continuous glucose monitoring   Dexcom G6 Sensor Misc Apply new sensor every 10 days   Dexcom G6 Transmitter Misc Replace every 3 months   Easy Comfort Pen Needles 31G X 8 MM Misc Generic drug: Insulin Pen Needle Check sugar 3x  daily   glucose blood test strip Use as instructed   Gvoke HypoPen 2-Pack 1 MG/0.2ML Soaj Generic drug: Glucagon Inject 1 mg into the skin as needed. For severe lows What changed: reasons to take this   HYDROcodone-acetaminophen 5-325 MG tablet Commonly known as: NORCO/VICODIN Take 1 tablet by mouth every 8 (eight) hours.   isosorbide dinitrate 20 MG tablet Commonly known as: ISORDIL Take 20 mg by mouth 2 (two) times daily.   lactulose 10 GM/15ML solution Commonly known as: CHRONULAC Take 30 mLs (20 g total) by mouth 2 (two) times daily as needed for mild constipation.   levothyroxine 50 MCG tablet Commonly known as: SYNTHROID Take 50 mcg by mouth daily before breakfast.   loratadine 10 MG tablet Commonly known as: CLARITIN Take 10 mg by mouth daily as needed for allergies.   methocarbamol 750 MG tablet Commonly known as:  Robaxin-750 Take 1 tablet (750 mg total) by mouth every 6 (six) hours as needed for muscle spasms.   metoCLOPramide 5 MG tablet Commonly known as: REGLAN Take 1 tablet (5 mg total)  by mouth 4 (four) times daily -  before meals and at bedtime.   metoprolol tartrate 25 MG tablet Commonly known as: LOPRESSOR Take 12.5 mg by mouth 2 (two) times daily.   NovoLOG 100 UNIT/ML injection Generic drug: insulin aspart Inject 150 Units into the skin once. With insulin pump   pantoprazole 40 MG tablet Commonly known as: PROTONIX Take 40 mg by mouth daily.   polyethylene glycol 17 g packet Commonly known as: MIRALAX / GLYCOLAX Take 17 g by mouth daily.   predniSONE 20 MG tablet Commonly known as: DELTASONE Take 3 tablets once daily for 3 days followed by 2 tablets once daily for 3 days followed by 1 tablet once daily for 3 days and then stop   pregabalin 75 MG capsule Commonly known as: LYRICA Take 75 mg by mouth 2 (two) times daily.   ranolazine 500 MG 12 hr tablet Commonly known as: RANEXA TAKE 1 TABLET BY MOUTH TWICE A DAY   senna-docusate 8.6-50 MG tablet Commonly known as: Senokot-S Take 2 tablets by mouth 2 (two) times daily.   sertraline 100 MG tablet Commonly known as: ZOLOFT Take 100 mg by mouth daily.   Systane 0.4-0.3 % Gel ophthalmic gel Generic drug: Polyethyl Glycol-Propyl Glycol Place 1 application into both eyes 3 (three) times daily as needed (dry/irritated eyes.).   [name] FlexTouch 200 UNIT/ML FlexTouch Pen Generic drug: insulin degludec INJECT 30 UNITS INTO THE SKIN DAILY.   Trulicity 1.5 0000000 Sopn Generic drug: Dulaglutide Inject 1.5 mg into the skin every Monday.       Allergies: No Known Allergies  Past Medical History:  Diagnosis Date  . Acute kidney injury superimposed on CKD ([place]) 03/15/2019  . Acute respiratory failure with hypoxia ([place]) 03/15/2019  . Adjustment disorder with mixed anxiety and depressed mood 03/11/2019  . Anemia   .  Anxiety   . Arthritis    right ankle  . Blind    right eye  . CHF (congestive heart failure) ([place])   . Closed right pilon fracture, initial encounter 07/26/2019  . COPD (chronic obstructive pulmonary disease) ([place])   . Coronary artery disease   . Depression   . Diabetes mellitus type 2 in obese ([place]) 07/26/2019  . Diabetes mellitus without complication ([place])    type 1  . Diabetic polyneuropathy ([place]) 05/04/2016  . Dyslipidemia 01/03/2020  . Fever 03/10/2019  . GERD (gastroesophageal reflux disease)   . History of blood transfusion   . HLD (hyperlipidemia)   . Hyperglycemia   . Hypertension   . Hypothyroidism   . Ileus, postoperative ([place])   . Major depression, recurrent ([place]) 03/09/2019  . MDD (major depressive disorder), severe ([place]) 03/09/2019  . Myocardial infarct, old    2020, s/p DES RCA, RPDA 10/23/19  . Neck pain 04/21/2016  . Pneumonia 10/2019  . Renal disorder    stage 3  . Sepsis ([place]) 03/10/2019  . Short-term memory loss    mild  . Sleep apnea    study done 9/27 and 08/13/20 - no results yet 08/21/20  . Stroke ([place])   . Suicidal ideation 03/15/2019  . Type 1 diabetes mellitus without complication ([place]) [id number]  . Wound infection after surgery (Right ankle) 10/04/2019    Past Surgical History:  Procedure Laterality Date  . ABDOMINAL HYSTERECTOMY  2014  . ANKLE FUSION Right 08/23/2020   Procedure: ANKLE FUSION;  Surgeon: Shona Needles, MD;  Location: [facility];  Service: Orthopedics;  Laterality: Right;  . APPLICATION OF  WOUND VAC Right 10/11/2019   Procedure: WOUND VAC CHANGE TO RIGHT LEG;  Surgeon: Shona Needles, MD;  Location: [facility];  Service: Orthopedics;  Laterality: Right;  . CORONARY ANGIOPLASTY  10/23/2019   DES RCA, DES RPDA 10/23/19 ([place])  . EYE SURGERY    . HARDWARE REMOVAL Right 10/06/2019   Procedure: HARDWARE REMOVAL;  Surgeon: Shona Needles, MD;  Location: [facility];  Service: Orthopedics;  Laterality: Right;  . HARDWARE REMOVAL Right 08/23/2020    Procedure: HARDWARE REMOVAL;  Surgeon: Shona Needles, MD;  Location: [facility];  Service: Orthopedics;  Laterality: Right;  . I & D EXTREMITY Right 10/06/2019   Procedure: IRRIGATION AND DEBRIDEMENT EXTREMITY;  Surgeon: Shona Needles, MD;  Location: [facility];  Service: Orthopedics;  Laterality: Right;  . I & D EXTREMITY Right 10/09/2019   Procedure: IRRIGATION AND DEBRIDEMENT EXTREMITY and WOUND VAC CHANGE RIGHT ANKLE;  Surgeon: Shona Needles, MD;  Location: [facility];  Service: Orthopedics;  Laterality: Right;  . ORIF ANKLE FRACTURE Right 07/26/2019   Procedure: OPEN REDUCTION INTERNAL FIXATION (ORIF) ANKLE FRACTURE;  Surgeon: Shona Needles, MD;  Location: [facility];  Service: Orthopedics;  Laterality: Right;  OPEN REDUCTION INTERNAL FIXATION (ORIF) ANKLE FRACTURE   . TOE AMPUTATION      Family History  Problem Relation Age of Onset  . Diabetes Mellitus II Mother   . Stroke Mother   . Heart attack Mother   . Stomach cancer Father   . Congestive Heart Failure Father   . Diabetes Sister   . Congestive Heart Failure Maternal Grandfather     Social History:  reports that she has never smoked. She has never used smokeless tobacco. She reports previous alcohol use. She reports previous drug use.      Review of Systems  The following is a copy of the previous note  HYPOTHYROIDISM: Treated by PCP with levothyroxine 50 mcg and last TSH 4.7 done in 8/21      Lipids: Last LDL was 64 in June 2021 She is on a statin drug using 80 mg Lipitor  No results found for: CHOL, HDL, LDLCALC, LDLDIRECT, TRIG, CHOLHDL  Last dilated eye exam was in 3/21  Peripheral neuropathy: She will get some pains and paresthesia in her legs treated with Lyrica and tramadol.  Also has numbness in her hands  DIABETES COMPLICATIONS: Neuropathy, retinopathy nephropathy   CKD: Labs as follows, recently seen by nephrologist  Lab Results  Component Value Date   CREATININE 2.13 (H) 12/04/2020   CREATININE 2.06 (H)  12/03/2020   CREATININE 2.48 (H) 12/02/2020   Blood pressure being monitored by nephrologist  BP Readings from Last 3 Encounters:  03/20/21 140/78  03/05/21 134/84  12/04/20 (!) 117/57       Physical Examination:  BP 140/78   Pulse 71   Ht '5\' 2"'$  (1.575 m)   Wt 184 lb 6.4 oz (83.6 kg)   LMP  (LMP Unknown) Comment: hysterectomy 2014  SpO2 96%   BMI 33.73 kg/m     ASSESSMENT:  Diabetes type 1, on basal bolus insulin  A1c is last 6.9  With starting the insulin pump 2 weeks ago her blood sugars are significantly better  Her blood sugars are generally showing fairly good patterns with less variability May be trending higher recently in the early morning hours and does not have any consistently higher normal postprandial readings Has avoided any significant hypoglycemia also which had previously been a problem overnight She has  also been able to watch her diet better with not being afraid of hypoglycemia during her sleep  PLAN:   Recommendations Basal rate will be increased to 0.65 between 2 AM-8 AM Correction factor also 1: 55 for this time slot Changes were made by myself in the office today  She will continue to use a sleep mode during the night hours  Continue trying to adjust boluses based on carbohydrates and extra for higher fat meals Also do correction boluses when blood sugars are persistently high To call if she has any difficulty with unusually high or low readings She will also let us know if more paperwork is needed to get Dexcom prior authorized Sample given  Needs follow-up thyroid levels unless done recently by PCP  There are no Patient Instructions on file for this visit.   Total visit time including counseling: 30 minutes  Elayne Snare 03/20/2021, 1:22 PM      Note: This note was prepared with Dragon voice recognition system technology. Any transcriptional errors that result from this process are unintentional.

## 2021-03-24 DIAGNOSIS — E1022 Type 1 diabetes mellitus with diabetic chronic kidney disease: Secondary | ICD-10-CM | POA: Diagnosis not present

## 2021-03-25 ENCOUNTER — Telehealth: Payer: Self-pay | Admitting: Endocrinology

## 2021-03-25 NOTE — Telephone Encounter (Signed)
Pt [facility] is needing a new prescription that includes the transmitter.  Patient has 10 more days left of the sensor she has on now. Patient would like a call back when it is sent to [facility].

## 2021-03-26 ENCOUNTER — Other Ambulatory Visit: Payer: Self-pay | Admitting: *Deleted

## 2021-03-26 MED ORDER — DEXCOM G6 SENSOR MISC: 6 refills | Status: DC

## 2021-03-26 MED ORDER — DEXCOM G6 TRANSMITTER MISC: 3 refills | Status: DC

## 2021-03-26 NOTE — Telephone Encounter (Signed)
On your desk for signature

## 2021-03-28 DIAGNOSIS — J449 Chronic obstructive pulmonary disease, unspecified: Secondary | ICD-10-CM | POA: Diagnosis not present

## 2021-03-28 DIAGNOSIS — E039 Hypothyroidism, unspecified: Secondary | ICD-10-CM | POA: Diagnosis not present

## 2021-03-28 DIAGNOSIS — G5603 Carpal tunnel syndrome, bilateral upper limbs: Secondary | ICD-10-CM | POA: Diagnosis not present

## 2021-03-28 DIAGNOSIS — I509 Heart failure, unspecified: Secondary | ICD-10-CM | POA: Diagnosis not present

## 2021-03-28 DIAGNOSIS — R110 Nausea: Secondary | ICD-10-CM | POA: Diagnosis not present

## 2021-03-28 DIAGNOSIS — G4733 Obstructive sleep apnea (adult) (pediatric): Secondary | ICD-10-CM | POA: Diagnosis not present

## 2021-03-28 DIAGNOSIS — M79671 Pain in right foot: Secondary | ICD-10-CM | POA: Diagnosis not present

## 2021-03-28 DIAGNOSIS — N184 Chronic kidney disease, stage 4 (severe): Secondary | ICD-10-CM | POA: Diagnosis not present

## 2021-03-28 DIAGNOSIS — I679 Cerebrovascular disease, unspecified: Secondary | ICD-10-CM | POA: Diagnosis not present

## 2021-03-31 DIAGNOSIS — E1065 Type 1 diabetes mellitus with hyperglycemia: Secondary | ICD-10-CM | POA: Diagnosis not present

## 2021-04-11 DIAGNOSIS — R110 Nausea: Secondary | ICD-10-CM | POA: Diagnosis not present

## 2021-04-11 DIAGNOSIS — E039 Hypothyroidism, unspecified: Secondary | ICD-10-CM | POA: Diagnosis not present

## 2021-04-11 DIAGNOSIS — I509 Heart failure, unspecified: Secondary | ICD-10-CM | POA: Diagnosis not present

## 2021-04-11 DIAGNOSIS — I679 Cerebrovascular disease, unspecified: Secondary | ICD-10-CM | POA: Diagnosis not present

## 2021-04-11 DIAGNOSIS — J449 Chronic obstructive pulmonary disease, unspecified: Secondary | ICD-10-CM | POA: Diagnosis not present

## 2021-04-11 DIAGNOSIS — G4733 Obstructive sleep apnea (adult) (pediatric): Secondary | ICD-10-CM | POA: Diagnosis not present

## 2021-04-11 DIAGNOSIS — G5603 Carpal tunnel syndrome, bilateral upper limbs: Secondary | ICD-10-CM | POA: Diagnosis not present

## 2021-04-11 DIAGNOSIS — M79671 Pain in right foot: Secondary | ICD-10-CM | POA: Diagnosis not present

## 2021-04-11 DIAGNOSIS — N184 Chronic kidney disease, stage 4 (severe): Secondary | ICD-10-CM | POA: Diagnosis not present

## 2021-04-14 DIAGNOSIS — I5042 Chronic combined systolic (congestive) and diastolic (congestive) heart failure: Secondary | ICD-10-CM | POA: Diagnosis not present

## 2021-04-16 ENCOUNTER — Other Ambulatory Visit: Payer: Self-pay | Admitting: Cardiology

## 2021-04-16 DIAGNOSIS — E1065 Type 1 diabetes mellitus with hyperglycemia: Secondary | ICD-10-CM | POA: Diagnosis not present

## 2021-04-16 DIAGNOSIS — E1022 Type 1 diabetes mellitus with diabetic chronic kidney disease: Secondary | ICD-10-CM | POA: Diagnosis not present

## 2021-04-16 NOTE — Telephone Encounter (Signed)
Refill sent to pharmacy.   

## 2021-04-25 DIAGNOSIS — J449 Chronic obstructive pulmonary disease, unspecified: Secondary | ICD-10-CM | POA: Diagnosis not present

## 2021-04-25 DIAGNOSIS — G5603 Carpal tunnel syndrome, bilateral upper limbs: Secondary | ICD-10-CM | POA: Diagnosis not present

## 2021-04-25 DIAGNOSIS — I679 Cerebrovascular disease, unspecified: Secondary | ICD-10-CM | POA: Diagnosis not present

## 2021-04-25 DIAGNOSIS — N184 Chronic kidney disease, stage 4 (severe): Secondary | ICD-10-CM | POA: Diagnosis not present

## 2021-04-25 DIAGNOSIS — G4733 Obstructive sleep apnea (adult) (pediatric): Secondary | ICD-10-CM | POA: Diagnosis not present

## 2021-04-25 DIAGNOSIS — R110 Nausea: Secondary | ICD-10-CM | POA: Diagnosis not present

## 2021-04-25 DIAGNOSIS — E039 Hypothyroidism, unspecified: Secondary | ICD-10-CM | POA: Diagnosis not present

## 2021-04-25 DIAGNOSIS — I509 Heart failure, unspecified: Secondary | ICD-10-CM | POA: Diagnosis not present

## 2021-04-25 DIAGNOSIS — M79671 Pain in right foot: Secondary | ICD-10-CM | POA: Diagnosis not present

## 2021-05-09 DIAGNOSIS — G4733 Obstructive sleep apnea (adult) (pediatric): Secondary | ICD-10-CM | POA: Diagnosis not present

## 2021-05-09 DIAGNOSIS — R110 Nausea: Secondary | ICD-10-CM | POA: Diagnosis not present

## 2021-05-09 DIAGNOSIS — G5603 Carpal tunnel syndrome, bilateral upper limbs: Secondary | ICD-10-CM | POA: Diagnosis not present

## 2021-05-09 DIAGNOSIS — F3341 Major depressive disorder, recurrent, in partial remission: Secondary | ICD-10-CM | POA: Diagnosis not present

## 2021-05-09 DIAGNOSIS — E1121 Type 2 diabetes mellitus with diabetic nephropathy: Secondary | ICD-10-CM | POA: Diagnosis not present

## 2021-05-09 DIAGNOSIS — N184 Chronic kidney disease, stage 4 (severe): Secondary | ICD-10-CM | POA: Diagnosis not present

## 2021-05-09 DIAGNOSIS — J449 Chronic obstructive pulmonary disease, unspecified: Secondary | ICD-10-CM | POA: Diagnosis not present

## 2021-05-09 DIAGNOSIS — E1140 Type 2 diabetes mellitus with diabetic neuropathy, unspecified: Secondary | ICD-10-CM | POA: Diagnosis not present

## 2021-05-09 DIAGNOSIS — E039 Hypothyroidism, unspecified: Secondary | ICD-10-CM | POA: Diagnosis not present

## 2021-05-09 DIAGNOSIS — Z794 Long term (current) use of insulin: Secondary | ICD-10-CM | POA: Diagnosis not present

## 2021-05-09 DIAGNOSIS — I679 Cerebrovascular disease, unspecified: Secondary | ICD-10-CM | POA: Diagnosis not present

## 2021-05-09 DIAGNOSIS — I509 Heart failure, unspecified: Secondary | ICD-10-CM | POA: Diagnosis not present

## 2021-05-09 DIAGNOSIS — I130 Hypertensive heart and chronic kidney disease with heart failure and stage 1 through stage 4 chronic kidney disease, or unspecified chronic kidney disease: Secondary | ICD-10-CM | POA: Diagnosis not present

## 2021-05-09 DIAGNOSIS — E1143 Type 2 diabetes mellitus with diabetic autonomic (poly)neuropathy: Secondary | ICD-10-CM | POA: Diagnosis not present

## 2021-05-09 DIAGNOSIS — M79671 Pain in right foot: Secondary | ICD-10-CM | POA: Diagnosis not present

## 2021-05-15 DIAGNOSIS — I5042 Chronic combined systolic (congestive) and diastolic (congestive) heart failure: Secondary | ICD-10-CM | POA: Diagnosis not present

## 2021-05-16 DIAGNOSIS — E1022 Type 1 diabetes mellitus with diabetic chronic kidney disease: Secondary | ICD-10-CM | POA: Diagnosis not present

## 2021-05-16 DIAGNOSIS — E1065 Type 1 diabetes mellitus with hyperglycemia: Secondary | ICD-10-CM | POA: Diagnosis not present

## 2021-05-22 ENCOUNTER — Ambulatory Visit: Payer: Medicare HMO | Admitting: Endocrinology

## 2021-05-26 DIAGNOSIS — E039 Hypothyroidism, unspecified: Secondary | ICD-10-CM | POA: Diagnosis not present

## 2021-05-26 DIAGNOSIS — I509 Heart failure, unspecified: Secondary | ICD-10-CM | POA: Diagnosis not present

## 2021-05-26 DIAGNOSIS — M159 Polyosteoarthritis, unspecified: Secondary | ICD-10-CM | POA: Diagnosis not present

## 2021-05-26 DIAGNOSIS — I679 Cerebrovascular disease, unspecified: Secondary | ICD-10-CM | POA: Diagnosis not present

## 2021-05-26 DIAGNOSIS — N184 Chronic kidney disease, stage 4 (severe): Secondary | ICD-10-CM | POA: Diagnosis not present

## 2021-05-26 DIAGNOSIS — G4733 Obstructive sleep apnea (adult) (pediatric): Secondary | ICD-10-CM | POA: Diagnosis not present

## 2021-05-26 DIAGNOSIS — R110 Nausea: Secondary | ICD-10-CM | POA: Diagnosis not present

## 2021-05-26 DIAGNOSIS — F419 Anxiety disorder, unspecified: Secondary | ICD-10-CM | POA: Diagnosis not present

## 2021-05-26 DIAGNOSIS — J449 Chronic obstructive pulmonary disease, unspecified: Secondary | ICD-10-CM | POA: Diagnosis not present

## 2021-06-05 ENCOUNTER — Other Ambulatory Visit: Payer: Self-pay

## 2021-06-05 ENCOUNTER — Ambulatory Visit (INDEPENDENT_AMBULATORY_CARE_PROVIDER_SITE_OTHER): Payer: Medicare HMO | Admitting: Endocrinology

## 2021-06-05 ENCOUNTER — Encounter: Payer: Self-pay | Admitting: Endocrinology

## 2021-06-05 VITALS — BP 142/78 | HR 75 | Ht 62.0 in | Wt 182.8 lb

## 2021-06-05 DIAGNOSIS — E039 Hypothyroidism, unspecified: Secondary | ICD-10-CM

## 2021-06-05 DIAGNOSIS — E1065 Type 1 diabetes mellitus with hyperglycemia: Secondary | ICD-10-CM

## 2021-06-05 DIAGNOSIS — R6889 Other general symptoms and signs: Secondary | ICD-10-CM | POA: Diagnosis not present

## 2021-06-05 DIAGNOSIS — N1832 Chronic kidney disease, stage 3b: Secondary | ICD-10-CM

## 2021-06-05 LAB — POCT GLYCOSYLATED HEMOGLOBIN (HGB A1C): Hemoglobin A1C: 6.8 % — AB (ref 4.0–5.6)

## 2021-06-05 MED ORDER — INSULIN ASPART 100 UNIT/ML IJ SOLN: INTRAMUSCULAR | 2 refills | Status: DC

## 2021-06-05 NOTE — Progress Notes (Signed)
Patient ID: Deanna Landry, female   DOB: 10/18/1971, 50 y.o.   MRN: [id number]           Reason for Appointment : Follow-up for Type 1 Diabetes  History of Present Illness   Referring HCP:Lee, Keung         Diagnosis: Type 1 diabetes mellitus, date of diagnosis: Age 74         Previous history:   She has been on insulin since her diagnosis when she had significant symptomatic hypoglycemia. Has been on various types of insulin regimens in the past but usually not followed by endocrinologist The last 5 or 6 years she has been on Levemir and NovoLog Previous level of control is variable with A1c only inconsistently below 7 BASAL insulin doses previously: 14 units Tresiba daily  Recent history:   She is on the T-slim pump started on 03/04/21   Basal rates: 12 AM- 2 AM = 0.60, 2 AM-8 AM = 0.65, 8 AM-12 AM = 0.50 ISF: 1: 60 except from 2-8 AM, mealtime carbohydrate ratio 1:1 Target 120 Active insulin 5 hours  Non-insulin hypoglycemic drugs: Trulicity 1.5 mg weekly  A1c is stable at 6.8, was 6.9  Current management, blood sugar patterns and problems identified:   She is trying to estimate her carbohydrates but appears to be taking roughly 3 units per 15 g  Not clear if she is able to count carbohydrates accurately although she is trying to look them up However with current estimation of using 3 units for each slice of bread for example she may be getting excessive boluses with low normal and occasionally low sugars after bolusing She has higher variability in her blood sugars and not clear why Only once appears to have had some issues with her infusion site with the needle coming out HYPOGLYCEMIA has been usually transient but not always related to boluses She will sometimes overreact to low sugars and on one night when she had a low sugar she made sweet tea with half a cup of sugar and drank it all HYPOGLYCEMIA with nighttime sleep settings appear to be uncontrolled She will  sometimes turn on the sleep more during the day and only yesterday turned on the exercise activity mode which is still all through the night She thinks she is sometimes more active in the late afternoons and this may cause lower readings   Interpretation of her Dexcom data is as follows for the last 2 weeks  Her blood sugars are highly variable in the early morning hours until at least 10 AM and has the highest overall readings at these times above the target range Lowest blood sugars on an average are in the evenings but again sugars significant variability POSTPRANDIAL readings are generally well controlled with near normal or sometimes low readings after the boluses Usually appears to be bolusing at the start of the meal or right after Hypoglycemia has occurred sporadically midday, early afternoon or late evening and has tendency to lower readings after about 9-10 PM Overnight blood sugars are quite variable, sometimes very consistently controlled but at times persistently high    CGM use % of time 98  2-week average/SD 162/67  Time in range      65% was 74  % Time Above 180 31  % Time above 250   % Time Below 70 4      PRE-MEAL  overnight  mornings  afternoon  evening Overall  Glucose range:  Averages:/SD 173/59 185/78 150 144    PREVIOUSLY:  CGM use % of time  97  2-week average/SD  160+/-34  Time in range    74    %  % Time Above 180  26  % Time above 250  74  % Time Below 70  0      PRE-MEAL  overnight  mornings  afternoon  evening Overall  Glucose range:       Averages:  153  158  173  159       Hypoglycemia:  occurs mostly overnight Factors causing hyperglycemia: Excessive Levemir at night Symptoms of hypoglycemia:none or minimal Treatment of hypoglycemia: Juices, if patient is unconscious mother will call the ambulance         Self-care: The diet that the patient has been following is: None, does not know carbohydrate counting Immediately skipping  breakfast, lunch will be assignments, dinner usually consists of bread or pasta Snacks will be peanut butter crackers, yogurt, cottage cheese, fruit Eating out only about once or twice a month   Mealtimes are: Breakfast none Lunch: 1 pm Dinner: 6pm         Exercise: None          Dietician consultation none.          Wt Readings from Last 3 Encounters:  06/05/21 182 lb 12.8 oz (82.9 kg)  03/20/21 184 lb 6.4 oz (83.6 kg)  03/05/21 188 lb 12.8 oz (85.6 kg)    Diabetes labs:  Lab Results  Component Value Date   HGBA1C 6.8 (A) 06/05/2021   HGBA1C 6.9 (A) 03/05/2021   HGBA1C 6.9 (H) 11/30/2020   Lab Results  Component Value Date   CREATININE 2.13 (H) 12/04/2020    No results found for: MICRALBCREAT   Allergies as of 06/05/2021   No Known Allergies      Medication List        Accurate as of June 05, 2021 12:36 PM. If you have any questions, ask your nurse or doctor.          STOP taking these medications    predniSONE 20 MG tablet Commonly known as: DELTASONE Stopped by: Elayne Snare, MD   [name] FlexTouch 200 UNIT/ML FlexTouch Pen Generic drug: insulin degludec Stopped by: Elayne Snare, MD       TAKE these medications    albuterol 108 (90 Base) MCG/ACT inhaler Commonly known as: VENTOLIN HFA Inhale 1-2 puffs into the lungs every 6 (six) hours as needed for wheezing or shortness of breath.   amLODipine 10 MG tablet Commonly known as: NORVASC Take 10 mg by mouth daily. Take 1/2 tablet daily   aspirin EC 81 MG tablet Take 81 mg by mouth daily.   atorvastatin 80 MG tablet Commonly known as: LIPITOR Take 80 mg by mouth daily.   benzonatate 100 MG capsule Commonly known as: Tessalon Perles Take 1 capsule (100 mg total) by mouth 3 (three) times daily as needed for cough.   buPROPion 300 MG 24 hr tablet Commonly known as: WELLBUTRIN XL Take 300 mg by mouth daily.   clopidogrel 75 MG tablet Commonly known as: PLAVIX Take 75 mg by mouth daily.    Dexcom G6 Receiver Devi Continuous glucose monitoring   Dexcom G6 Sensor Misc Apply new sensor every 10 days   Dexcom G6 Transmitter Misc Replace every 3 months   Easy Comfort Pen Needles 31G X 8 MM Misc Generic drug: Insulin Pen Needle Check sugar 3x  daily  glucose blood test strip Use as instructed   Gvoke HypoPen 2-Pack 1 MG/0.2ML Soaj Generic drug: Glucagon Inject 1 mg into the skin as needed. For severe lows What changed: reasons to take this   HYDROcodone-acetaminophen 5-325 MG tablet Commonly known as: NORCO/VICODIN Take 1 tablet by mouth every 8 (eight) hours.   isosorbide dinitrate 20 MG tablet Commonly known as: ISORDIL Take 20 mg by mouth 2 (two) times daily.   lactulose 10 GM/15ML solution Commonly known as: CHRONULAC Take 30 mLs (20 g total) by mouth 2 (two) times daily as needed for mild constipation.   levothyroxine 50 MCG tablet Commonly known as: SYNTHROID Take 50 mcg by mouth daily before breakfast.   loratadine 10 MG tablet Commonly known as: CLARITIN Take 10 mg by mouth daily as needed for allergies.   methocarbamol 750 MG tablet Commonly known as: Robaxin-750 Take 1 tablet (750 mg total) by mouth every 6 (six) hours as needed for muscle spasms.   metoCLOPramide 5 MG tablet Commonly known as: REGLAN Take 1 tablet (5 mg total) by mouth 4 (four) times daily -  before meals and at bedtime.   metoprolol tartrate 25 MG tablet Commonly known as: LOPRESSOR Take 12.5 mg by mouth 2 (two) times daily.   NovoLOG 100 UNIT/ML injection Generic drug: insulin aspart Inject 60 Units into the skin once. With insulin pump   pantoprazole 40 MG tablet Commonly known as: PROTONIX Take 40 mg by mouth daily.   polyethylene glycol 17 g packet Commonly known as: MIRALAX / GLYCOLAX Take 17 g by mouth daily.   pregabalin 75 MG capsule Commonly known as: LYRICA Take 75 mg by mouth 2 (two) times daily.   ranolazine 500 MG 12 hr tablet Commonly known  as: RANEXA TAKE 1 TABLET BY MOUTH TWICE A DAY   senna-docusate 8.6-50 MG tablet Commonly known as: Senokot-S Take 2 tablets by mouth 2 (two) times daily.   sertraline 100 MG tablet Commonly known as: ZOLOFT Take 100 mg by mouth daily.   Systane 0.4-0.3 % Gel ophthalmic gel Generic drug: Polyethyl Glycol-Propyl Glycol Place 1 application into both eyes 3 (three) times daily as needed (dry/irritated eyes.).   Trulicity 1.5 0000000 Sopn Generic drug: Dulaglutide Inject 1.5 mg into the skin every Monday.        Allergies: No Known Allergies  Past Medical History:  Diagnosis Date   Acute kidney injury superimposed on CKD ([place]) 03/15/2019   Acute respiratory failure with hypoxia ([place]) 03/15/2019   Adjustment disorder with mixed anxiety and depressed mood 03/11/2019   Anemia    Anxiety    Arthritis    right ankle   Blind    right eye   CHF (congestive heart failure) ([place])    Closed right pilon fracture, initial encounter 07/26/2019   COPD (chronic obstructive pulmonary disease) ([place])    Coronary artery disease    Depression    Diabetes mellitus type 2 in obese ([place]) 07/26/2019   Diabetes mellitus without complication ([place])    type 1   Diabetic polyneuropathy ([place]) 05/04/2016   Dyslipidemia 01/03/2020   Fever 03/10/2019   GERD (gastroesophageal reflux disease)    History of blood transfusion    HLD (hyperlipidemia)    Hyperglycemia    Hypertension    Hypothyroidism    Ileus, postoperative ([place])    Major depression, recurrent ([place]) 03/09/2019   MDD (major depressive disorder), severe ([place]) 03/09/2019   Myocardial infarct, old    2020, s/p DES RCA, RPDA 10/23/19  Neck pain 04/21/2016   Pneumonia 10/2019   Renal disorder    stage 3   Sepsis ([place]) 03/10/2019   Short-term memory loss    mild   Sleep apnea    study done 9/27 and 08/13/20 - no results yet 08/21/20   Stroke ([place])    Suicidal ideation 03/15/2019   Type 1 diabetes mellitus without complication ([place]) [id number]    Wound infection after surgery (Right ankle) 10/04/2019    Past Surgical History:  Procedure Laterality Date   ABDOMINAL HYSTERECTOMY  2014   ANKLE FUSION Right 08/23/2020   Procedure: ANKLE FUSION;  Surgeon: Shona Needles, MD;  Location: [facility];  Service: Orthopedics;  Laterality: Right;   APPLICATION OF WOUND VAC Right 10/11/2019   Procedure: WOUND VAC CHANGE TO RIGHT LEG;  Surgeon: Shona Needles, MD;  Location: [facility];  Service: Orthopedics;  Laterality: Right;   CORONARY ANGIOPLASTY  10/23/2019   DES RCA, DES RPDA 10/23/19 [facility])   EYE SURGERY     HARDWARE REMOVAL Right 10/06/2019   Procedure: HARDWARE REMOVAL;  Surgeon: Shona Needles, MD;  Location: [facility];  Service: Orthopedics;  Laterality: Right;   HARDWARE REMOVAL Right 08/23/2020   Procedure: HARDWARE REMOVAL;  Surgeon: Shona Needles, MD;  Location: [facility];  Service: Orthopedics;  Laterality: Right;   I & D EXTREMITY Right 10/06/2019   Procedure: IRRIGATION AND DEBRIDEMENT EXTREMITY;  Surgeon: Shona Needles, MD;  Location: [facility];  Service: Orthopedics;  Laterality: Right;   I & D EXTREMITY Right 10/09/2019   Procedure: IRRIGATION AND DEBRIDEMENT EXTREMITY and WOUND VAC CHANGE RIGHT ANKLE;  Surgeon: Shona Needles, MD;  Location: [facility];  Service: Orthopedics;  Laterality: Right;   ORIF ANKLE FRACTURE Right 07/26/2019   Procedure: OPEN REDUCTION INTERNAL FIXATION (ORIF) ANKLE FRACTURE;  Surgeon: Shona Needles, MD;  Location: [facility];  Service: Orthopedics;  Laterality: Right;  OPEN REDUCTION INTERNAL FIXATION (ORIF) ANKLE FRACTURE    TOE AMPUTATION      Family History  Problem Relation Age of Onset   Diabetes Mellitus II Mother    Stroke Mother    Heart attack Mother    Stomach cancer Father    Congestive Heart Failure Father    Diabetes Sister    Congestive Heart Failure Maternal Grandfather     Social History:  reports that she has never smoked. She has never used smokeless tobacco. She reports previous alcohol use. She  reports previous drug use.      Review of Systems    HYPOTHYROIDISM: Treated by PCP with levothyroxine 50 mcg and last TSH 4.7 done in 8/21 Not clear if she has had regular follow-up      Lipids: Last LDL was 64 in June 2021 She is on a statin drug using 80 mg Lipitor  No results found for: CHOL, HDL, LDLCALC, LDLDIRECT, TRIG, CHOLHDL  Last dilated eye exam was in 3/21  Peripheral neuropathy: She will get some pains and paresthesia in her legs treated with Lyrica and tramadol.  Also has numbness in her hands  DIABETES COMPLICATIONS: Neuropathy, retinopathy nephropathy   CKD: Labs as follows, regularly seen by nephrologist  Lab Results  Component Value Date   CREATININE 2.13 (H) 12/04/2020   CREATININE 2.06 (H) 12/03/2020   CREATININE 2.48 (H) 12/02/2020   Blood pressure being monitored by nephrologist  BP Readings from Last 3 Encounters:  06/05/21 (!) 142/78  03/20/21 140/78  03/05/21 134/84  Physical Examination:  BP (!) 142/78   Pulse 75   Ht '5\' 2"'$  (1.575 m)   Wt 182 lb 12.8 oz (82.9 kg)   LMP  (LMP Unknown) Comment: hysterectomy 2014  SpO2 98%   BMI 33.43 kg/m     ASSESSMENT:  Diabetes type 1, on basal bolus insulin  A1c is 6.8 was 6.9   Her blood sugars are showing more variability as discussed above and she is still learning how to use the pump appropriately As above she is likely getting too much insulin at times for her meals Appears to be using about a 3 units per carbohydrate calculation for her meals and likely needs less coverage Also probably needs to be off the sleep mode at night because of having high readings persistently at times without correction  She is not appearing to be setting the activity modes appropriately Also needs to use the exercise mode when more active in the afternoon which she is currently not knowing how to do Appears to be needing somewhat less basal rate late evening around midnight or before As before  sometimes will overtreat low sugars She does have glucagon available  PLAN:   Recommendations Basal rate will be decreased to 0.55 between 12 AM-2 AM and also to 0.45 between 9 PM-12 AM She will try to estimate carbohydrate coverage using 2 units per starch instead of 3 units for starch If she is comfortable counting carbohydrates may potentially try 1:7 ratio  Not to use the sleep mode To use the exercise modality  when being more active in the afternoon, showed her how to turn this on and off Considering follow-up with diabetes educator also  She will avoid overtreating low sugars Need to request lab results from PCP including lipids and thyroid  Patient Instructions  Exercise mode on activity setting when active  2 units bolus for each Carb/15grams  Not to use sleep setting   Total visit time including counseling: 30 minutes  Elayne Snare 06/05/2021, 12:36 PM      Note: This note was prepared with Dragon voice recognition system technology. Any transcriptional errors that result from this process are unintentional.

## 2021-06-05 NOTE — Patient Instructions (Signed)
Exercise mode on activity setting when active  2 units bolus for each Carb/15grams  Not to use sleep setting

## 2021-06-09 DIAGNOSIS — E039 Hypothyroidism, unspecified: Secondary | ICD-10-CM | POA: Diagnosis not present

## 2021-06-09 DIAGNOSIS — N184 Chronic kidney disease, stage 4 (severe): Secondary | ICD-10-CM | POA: Diagnosis not present

## 2021-06-09 DIAGNOSIS — M159 Polyosteoarthritis, unspecified: Secondary | ICD-10-CM | POA: Diagnosis not present

## 2021-06-09 DIAGNOSIS — I679 Cerebrovascular disease, unspecified: Secondary | ICD-10-CM | POA: Diagnosis not present

## 2021-06-09 DIAGNOSIS — J449 Chronic obstructive pulmonary disease, unspecified: Secondary | ICD-10-CM | POA: Diagnosis not present

## 2021-06-09 DIAGNOSIS — R110 Nausea: Secondary | ICD-10-CM | POA: Diagnosis not present

## 2021-06-09 DIAGNOSIS — F419 Anxiety disorder, unspecified: Secondary | ICD-10-CM | POA: Diagnosis not present

## 2021-06-09 DIAGNOSIS — I509 Heart failure, unspecified: Secondary | ICD-10-CM | POA: Diagnosis not present

## 2021-06-09 DIAGNOSIS — G4733 Obstructive sleep apnea (adult) (pediatric): Secondary | ICD-10-CM | POA: Diagnosis not present

## 2021-06-10 DIAGNOSIS — N184 Chronic kidney disease, stage 4 (severe): Secondary | ICD-10-CM | POA: Diagnosis not present

## 2021-06-14 DIAGNOSIS — I5042 Chronic combined systolic (congestive) and diastolic (congestive) heart failure: Secondary | ICD-10-CM | POA: Diagnosis not present

## 2021-06-16 DIAGNOSIS — R6889 Other general symptoms and signs: Secondary | ICD-10-CM | POA: Diagnosis not present

## 2021-06-16 DIAGNOSIS — D631 Anemia in chronic kidney disease: Secondary | ICD-10-CM | POA: Diagnosis not present

## 2021-06-16 DIAGNOSIS — E1065 Type 1 diabetes mellitus with hyperglycemia: Secondary | ICD-10-CM | POA: Diagnosis not present

## 2021-06-16 DIAGNOSIS — N2581 Secondary hyperparathyroidism of renal origin: Secondary | ICD-10-CM | POA: Diagnosis not present

## 2021-06-16 DIAGNOSIS — R109 Unspecified abdominal pain: Secondary | ICD-10-CM | POA: Diagnosis not present

## 2021-06-16 DIAGNOSIS — I129 Hypertensive chronic kidney disease with stage 1 through stage 4 chronic kidney disease, or unspecified chronic kidney disease: Secondary | ICD-10-CM | POA: Diagnosis not present

## 2021-06-16 DIAGNOSIS — E1022 Type 1 diabetes mellitus with diabetic chronic kidney disease: Secondary | ICD-10-CM | POA: Diagnosis not present

## 2021-06-16 DIAGNOSIS — N281 Cyst of kidney, acquired: Secondary | ICD-10-CM | POA: Diagnosis not present

## 2021-06-16 DIAGNOSIS — E109 Type 1 diabetes mellitus without complications: Secondary | ICD-10-CM | POA: Diagnosis not present

## 2021-06-16 DIAGNOSIS — R809 Proteinuria, unspecified: Secondary | ICD-10-CM | POA: Diagnosis not present

## 2021-06-16 DIAGNOSIS — N184 Chronic kidney disease, stage 4 (severe): Secondary | ICD-10-CM | POA: Diagnosis not present

## 2021-06-16 DIAGNOSIS — E875 Hyperkalemia: Secondary | ICD-10-CM | POA: Diagnosis not present

## 2021-06-23 DIAGNOSIS — N184 Chronic kidney disease, stage 4 (severe): Secondary | ICD-10-CM | POA: Diagnosis not present

## 2021-06-23 DIAGNOSIS — E785 Hyperlipidemia, unspecified: Secondary | ICD-10-CM | POA: Diagnosis not present

## 2021-06-23 DIAGNOSIS — R110 Nausea: Secondary | ICD-10-CM | POA: Diagnosis not present

## 2021-06-23 DIAGNOSIS — E1022 Type 1 diabetes mellitus with diabetic chronic kidney disease: Secondary | ICD-10-CM | POA: Diagnosis not present

## 2021-06-23 DIAGNOSIS — I10 Essential (primary) hypertension: Secondary | ICD-10-CM | POA: Diagnosis not present

## 2021-06-23 DIAGNOSIS — I679 Cerebrovascular disease, unspecified: Secondary | ICD-10-CM | POA: Diagnosis not present

## 2021-06-23 DIAGNOSIS — J449 Chronic obstructive pulmonary disease, unspecified: Secondary | ICD-10-CM | POA: Diagnosis not present

## 2021-06-23 DIAGNOSIS — F419 Anxiety disorder, unspecified: Secondary | ICD-10-CM | POA: Diagnosis not present

## 2021-06-23 DIAGNOSIS — I509 Heart failure, unspecified: Secondary | ICD-10-CM | POA: Diagnosis not present

## 2021-06-23 DIAGNOSIS — M159 Polyosteoarthritis, unspecified: Secondary | ICD-10-CM | POA: Diagnosis not present

## 2021-06-23 DIAGNOSIS — E039 Hypothyroidism, unspecified: Secondary | ICD-10-CM | POA: Diagnosis not present

## 2021-06-23 DIAGNOSIS — G4733 Obstructive sleep apnea (adult) (pediatric): Secondary | ICD-10-CM | POA: Diagnosis not present

## 2021-06-23 DIAGNOSIS — E1121 Type 2 diabetes mellitus with diabetic nephropathy: Secondary | ICD-10-CM | POA: Diagnosis not present

## 2021-06-28 DIAGNOSIS — E1065 Type 1 diabetes mellitus with hyperglycemia: Secondary | ICD-10-CM | POA: Diagnosis not present

## 2021-07-01 DIAGNOSIS — N184 Chronic kidney disease, stage 4 (severe): Secondary | ICD-10-CM | POA: Diagnosis not present

## 2021-07-07 DIAGNOSIS — G4733 Obstructive sleep apnea (adult) (pediatric): Secondary | ICD-10-CM | POA: Diagnosis not present

## 2021-07-07 DIAGNOSIS — I679 Cerebrovascular disease, unspecified: Secondary | ICD-10-CM | POA: Diagnosis not present

## 2021-07-07 DIAGNOSIS — R110 Nausea: Secondary | ICD-10-CM | POA: Diagnosis not present

## 2021-07-07 DIAGNOSIS — M159 Polyosteoarthritis, unspecified: Secondary | ICD-10-CM | POA: Diagnosis not present

## 2021-07-07 DIAGNOSIS — N184 Chronic kidney disease, stage 4 (severe): Secondary | ICD-10-CM | POA: Diagnosis not present

## 2021-07-07 DIAGNOSIS — F419 Anxiety disorder, unspecified: Secondary | ICD-10-CM | POA: Diagnosis not present

## 2021-07-07 DIAGNOSIS — I509 Heart failure, unspecified: Secondary | ICD-10-CM | POA: Diagnosis not present

## 2021-07-07 DIAGNOSIS — E039 Hypothyroidism, unspecified: Secondary | ICD-10-CM | POA: Diagnosis not present

## 2021-07-07 DIAGNOSIS — J449 Chronic obstructive pulmonary disease, unspecified: Secondary | ICD-10-CM | POA: Diagnosis not present

## 2021-07-15 DIAGNOSIS — I5042 Chronic combined systolic (congestive) and diastolic (congestive) heart failure: Secondary | ICD-10-CM | POA: Diagnosis not present

## 2021-07-17 DIAGNOSIS — E1022 Type 1 diabetes mellitus with diabetic chronic kidney disease: Secondary | ICD-10-CM | POA: Diagnosis not present

## 2021-07-17 DIAGNOSIS — E1065 Type 1 diabetes mellitus with hyperglycemia: Secondary | ICD-10-CM | POA: Diagnosis not present

## 2021-07-22 DIAGNOSIS — I679 Cerebrovascular disease, unspecified: Secondary | ICD-10-CM | POA: Diagnosis not present

## 2021-07-22 DIAGNOSIS — N184 Chronic kidney disease, stage 4 (severe): Secondary | ICD-10-CM | POA: Diagnosis not present

## 2021-07-22 DIAGNOSIS — I509 Heart failure, unspecified: Secondary | ICD-10-CM | POA: Diagnosis not present

## 2021-07-22 DIAGNOSIS — R5382 Chronic fatigue, unspecified: Secondary | ICD-10-CM | POA: Diagnosis not present

## 2021-07-22 DIAGNOSIS — G4733 Obstructive sleep apnea (adult) (pediatric): Secondary | ICD-10-CM | POA: Diagnosis not present

## 2021-07-22 DIAGNOSIS — G5603 Carpal tunnel syndrome, bilateral upper limbs: Secondary | ICD-10-CM | POA: Diagnosis not present

## 2021-07-22 DIAGNOSIS — J449 Chronic obstructive pulmonary disease, unspecified: Secondary | ICD-10-CM | POA: Diagnosis not present

## 2021-07-22 DIAGNOSIS — E039 Hypothyroidism, unspecified: Secondary | ICD-10-CM | POA: Diagnosis not present

## 2021-07-22 DIAGNOSIS — R110 Nausea: Secondary | ICD-10-CM | POA: Diagnosis not present

## 2021-07-23 DIAGNOSIS — H402223 Chronic angle-closure glaucoma, left eye, severe stage: Secondary | ICD-10-CM | POA: Diagnosis not present

## 2021-07-23 DIAGNOSIS — H26492 Other secondary cataract, left eye: Secondary | ICD-10-CM | POA: Diagnosis not present

## 2021-07-29 ENCOUNTER — Emergency Department (HOSPITAL_COMMUNITY)
Admission: EM | Admit: 2021-07-29 | Discharge: 2021-08-06 | Disposition: A | Discharge status: Home / Self Care | Payer: Medicare HMO | Attending: Emergency Medicine | Admitting: Emergency Medicine

## 2021-07-29 DIAGNOSIS — Z20822 Contact with and (suspected) exposure to covid-19: Secondary | ICD-10-CM | POA: Insufficient documentation

## 2021-07-29 DIAGNOSIS — R45851 Suicidal ideations: Secondary | ICD-10-CM | POA: Insufficient documentation

## 2021-07-29 DIAGNOSIS — J449 Chronic obstructive pulmonary disease, unspecified: Secondary | ICD-10-CM | POA: Insufficient documentation

## 2021-07-29 DIAGNOSIS — N184 Chronic kidney disease, stage 4 (severe): Secondary | ICD-10-CM | POA: Diagnosis not present

## 2021-07-29 DIAGNOSIS — R441 Visual hallucinations: Secondary | ICD-10-CM | POA: Insufficient documentation

## 2021-07-29 DIAGNOSIS — E119 Type 2 diabetes mellitus without complications: Secondary | ICD-10-CM | POA: Insufficient documentation

## 2021-07-29 DIAGNOSIS — E161 Other hypoglycemia: Secondary | ICD-10-CM | POA: Diagnosis not present

## 2021-07-29 DIAGNOSIS — Z7984 Long term (current) use of oral hypoglycemic drugs: Secondary | ICD-10-CM | POA: Diagnosis not present

## 2021-07-29 DIAGNOSIS — Z7982 Long term (current) use of aspirin: Secondary | ICD-10-CM | POA: Diagnosis not present

## 2021-07-29 DIAGNOSIS — I130 Hypertensive heart and chronic kidney disease with heart failure and stage 1 through stage 4 chronic kidney disease, or unspecified chronic kidney disease: Secondary | ICD-10-CM | POA: Insufficient documentation

## 2021-07-29 DIAGNOSIS — I10 Essential (primary) hypertension: Secondary | ICD-10-CM | POA: Diagnosis not present

## 2021-07-29 DIAGNOSIS — F333 Major depressive disorder, recurrent, severe with psychotic symptoms: Secondary | ICD-10-CM | POA: Diagnosis present

## 2021-07-29 DIAGNOSIS — I509 Heart failure, unspecified: Secondary | ICD-10-CM | POA: Diagnosis not present

## 2021-07-29 DIAGNOSIS — I2510 Atherosclerotic heart disease of native coronary artery without angina pectoris: Secondary | ICD-10-CM | POA: Diagnosis not present

## 2021-07-29 DIAGNOSIS — R443 Hallucinations, unspecified: Secondary | ICD-10-CM | POA: Diagnosis not present

## 2021-07-29 DIAGNOSIS — R0902 Hypoxemia: Secondary | ICD-10-CM | POA: Diagnosis not present

## 2021-07-29 DIAGNOSIS — R442 Other hallucinations: Secondary | ICD-10-CM | POA: Diagnosis not present

## 2021-07-29 DIAGNOSIS — E162 Hypoglycemia, unspecified: Secondary | ICD-10-CM | POA: Diagnosis not present

## 2021-07-29 MED ORDER — HALOPERIDOL LACTATE 5 MG/ML IJ SOLN
2.0000 mg | Freq: Once | INTRAMUSCULAR | Status: AC
  Administered 2021-07-29: 2 mg via INTRAVENOUS
  Filled 2021-07-29: qty 1

## 2021-07-29 NOTE — ED Triage Notes (Signed)
[facility] c/o SI x2wks, plan is to OD on insulin. CBG 53, given D10 en route, CBG on arrival 110. Pt experiencing hallucinations, given '5mg'$  versed for same. 20G R arm  200/100 manual Legally blind, hx depression

## 2021-07-29 NOTE — ED Provider Notes (Signed)
[facility] EMERGENCY DEPARTMENT Provider Note   CSN: [id number] Arrival date & time: 07/29/21  2244     History Chief Complaint  Patient presents with   Suicidal   Hypertension   Hypoglycemia    Tyneshia D Sehr is a 50 y.o. female.  Patient to ED with report that she has been hallucinating for the past several months, seeing black and yellow snakes with bugs in their mouth, large disney characters, lines on the floor. No history of hallucinations. Has not been able to see a psychiatrist yet. No CP, nausea, vomiting. No recent illness or fever. No history of drugs or alcohol. She reports that she is now feeling suicidal, "can't take it anymore".   The history is provided by the patient. No language interpreter was used.  Hypertension  Hypoglycemia     Past Medical History:  Diagnosis Date   Acute kidney injury superimposed on CKD ([place]) 03/15/2019   Acute respiratory failure with hypoxia ([place]) 03/15/2019   Adjustment disorder with mixed anxiety and depressed mood 03/11/2019   Anemia    Anxiety    Arthritis    right ankle   Blind    right eye   CHF (congestive heart failure) ([place])    Closed right pilon fracture, initial encounter 07/26/2019   COPD (chronic obstructive pulmonary disease) ([place])    Coronary artery disease    Depression    Diabetes mellitus type 2 in obese ([place]) 07/26/2019   Diabetes mellitus without complication ([place])    type 1   Diabetic polyneuropathy ([place]) 05/04/2016   Dyslipidemia 01/03/2020   Fever 03/10/2019   GERD (gastroesophageal reflux disease)    History of blood transfusion    HLD (hyperlipidemia)    Hyperglycemia    Hypertension    Hypothyroidism    Ileus, postoperative ([place])    Major depression, recurrent ([place]) 03/09/2019   MDD (major depressive disorder), severe ([place]) 03/09/2019   Myocardial infarct, old    2020, s/p DES RCA, RPDA 10/23/19   Neck pain 04/21/2016   Pneumonia 10/2019   Renal disorder    stage 3   Sepsis ([place])  03/10/2019   Short-term memory loss    mild   Sleep apnea    study done 9/27 and 08/13/20 - no results yet 08/21/20   Stroke ([place])    Suicidal ideation 03/15/2019   Type 1 diabetes mellitus without complication ([place]) [id number]   Wound infection after surgery (Right ankle) 10/04/2019    Patient Active Problem List   Diagnosis Date Noted   Pneumonia due to COVID-19 virus 11/28/2020   Nausea and vomiting 11/28/2020   Leukopenia 11/28/2020   CKD (chronic kidney disease), stage IV ([place]) 11/28/2020   Anemia    Anxiety    Arthritis    Blind    CHF (congestive heart failure) (HCC)    COPD (chronic obstructive pulmonary disease) ([place])    Depression    Diabetes mellitus without complication (HCC)    GERD (gastroesophageal reflux disease)    History of blood transfusion    HLD (hyperlipidemia)    Hypothyroidism    Myocardial infarct, old    Renal disorder    Short-term memory loss    Sleep apnea    Stroke [facility])    Dyslipidemia 01/03/2020   Coronary artery disease status post PTCA to 4 drug-eluting stent implanted to the right coronary artery in fall 2020 in [place] 11/02/2019   Ileus, postoperative (HCC)    Hyperglycemia    Wound infection  after surgery (Right ankle) 10/04/2019   Closed right pilon fracture, initial encounter 07/26/2019   Diabetes mellitus type 2 in obese ([place]) 07/26/2019   Hypertension 07/26/2019   Pneumonia 03/15/2019   Type 1 diabetes mellitus without complication ([place]) [id number]   Acute kidney injury superimposed on CKD ([place]) 03/15/2019   Suicidal ideation 03/15/2019   Acute respiratory failure with hypoxia ([place]) 03/15/2019   Adjustment disorder with mixed anxiety and depressed mood 03/11/2019   Fever 03/10/2019   Sepsis ([place]) 03/10/2019   MDD (major depressive disorder), severe ([place]) 03/09/2019   Major depression, recurrent ([place]) 03/09/2019   Diabetic polyneuropathy ([place]) 05/04/2016   Neck pain 04/21/2016    Past Surgical History:  Procedure  Laterality Date   ABDOMINAL HYSTERECTOMY  2014   ANKLE FUSION Right 08/23/2020   Procedure: ANKLE FUSION;  Surgeon: Shona Needles, MD;  Location: [place];  Service: Orthopedics;  Laterality: Right;   APPLICATION OF WOUND VAC Right 10/11/2019   Procedure: WOUND VAC CHANGE TO RIGHT LEG;  Surgeon: Shona Needles, MD;  Location: [place];  Service: Orthopedics;  Laterality: Right;   CORONARY ANGIOPLASTY  10/23/2019   DES RCA, DES RPDA 10/23/19 [facility])   EYE SURGERY     HARDWARE REMOVAL Right 10/06/2019   Procedure: HARDWARE REMOVAL;  Surgeon: Shona Needles, MD;  Location: [place];  Service: Orthopedics;  Laterality: Right;   HARDWARE REMOVAL Right 08/23/2020   Procedure: HARDWARE REMOVAL;  Surgeon: Shona Needles, MD;  Location: [place];  Service: Orthopedics;  Laterality: Right;   I & D EXTREMITY Right 10/06/2019   Procedure: IRRIGATION AND DEBRIDEMENT EXTREMITY;  Surgeon: Shona Needles, MD;  Location: [place];  Service: Orthopedics;  Laterality: Right;   I & D EXTREMITY Right 10/09/2019   Procedure: IRRIGATION AND DEBRIDEMENT EXTREMITY and WOUND VAC CHANGE RIGHT ANKLE;  Surgeon: Shona Needles, MD;  Location: [place];  Service: Orthopedics;  Laterality: Right;   ORIF ANKLE FRACTURE Right 07/26/2019   Procedure: OPEN REDUCTION INTERNAL FIXATION (ORIF) ANKLE FRACTURE;  Surgeon: Shona Needles, MD;  Location: [place];  Service: Orthopedics;  Laterality: Right;  OPEN REDUCTION INTERNAL FIXATION (ORIF) ANKLE FRACTURE    TOE AMPUTATION       OB History   No obstetric history on file.     Family History  Problem Relation Age of Onset   Diabetes Mellitus II Mother    Stroke Mother    Heart attack Mother    Stomach cancer Father    Congestive Heart Failure Father    Diabetes Sister    Congestive Heart Failure Maternal Grandfather     Social History   Tobacco Use   Smoking status: Never   Smokeless tobacco: Never  Vaping Use   Vaping Use: Never used  Substance Use Topics   Alcohol use: Not  Currently   Drug use: Not Currently    Home Medications Prior to Admission medications   Medication Sig Start Date End Date Taking? Authorizing Provider  albuterol (VENTOLIN HFA) 108 (90 Base) MCG/ACT inhaler Inhale 1-2 puffs into the lungs every 6 (six) hours as needed for wheezing or shortness of breath.    [provider]  amLODipine (NORVASC) 10 MG tablet Take 10 mg by mouth daily. Take 1/2 tablet daily 01/15/19   [provider]  aspirin EC 81 MG tablet Take 81 mg by mouth daily.    [provider]  atorvastatin (LIPITOR) 80 MG tablet Take 80 mg by mouth daily. 12/28/18  [provider]  benzonatate (TESSALON PERLES) 100 MG capsule Take 1 capsule (100 mg total) by mouth 3 (three) times daily as needed for cough. 12/04/20   Bonnielee Haff, MD  buPROPion (WELLBUTRIN XL) 300 MG 24 hr tablet Take 300 mg by mouth daily. 09/21/19   [provider]  clopidogrel (PLAVIX) 75 MG tablet Take 75 mg by mouth daily. 01/15/19   [provider]  Continuous Blood Gluc Receiver (DEXCOM G6 RECEIVER) DEVI Continuous glucose monitoring 03/18/21   Elayne Snare, MD  Continuous Blood Gluc Sensor (DEXCOM G6 SENSOR) MISC Apply new sensor every 10 days 03/26/21   Elayne Snare, MD  Continuous Blood Gluc Transmit (DEXCOM G6 TRANSMITTER) MISC Replace every 3 months 03/26/21   Elayne Snare, MD  Glucagon (GVOKE HYPOPEN 2-PACK) 1 MG/0.2ML SOAJ Inject 1 mg into the skin as needed. For severe lows Patient taking differently: Inject 1 mg into the skin as needed (low blood sugar). For severe lows 04/26/20   Elayne Snare, MD  glucose blood test strip Use as instructed 08/12/20   Elayne Snare, MD  HYDROcodone-acetaminophen (NORCO/VICODIN) 5-325 MG tablet Take 1 tablet by mouth every 8 (eight) hours. 09/20/20   [provider]  insulin aspart (NOVOLOG) 100 UNIT/ML injection Use maximum of 65 units daily in insulin pump 06/05/21   Elayne Snare, MD  Insulin Pen Needle (EASY COMFORT PEN  NEEDLES) 31G X 8 MM MISC Check sugar 3x  daily Patient taking differently: Check sugar 3x  daily 08/12/20   Elayne Snare, MD  isosorbide dinitrate (ISORDIL) 20 MG tablet Take 20 mg by mouth 2 (two) times daily. 01/27/19   [provider]  lactulose (CHRONULAC) 10 GM/15ML solution Take 30 mLs (20 g total) by mouth 2 (two) times daily as needed for mild constipation. 12/04/20   Bonnielee Haff, MD  levothyroxine (SYNTHROID) 50 MCG tablet Take 50 mcg by mouth daily before breakfast.  02/02/19   [provider]  loratadine (CLARITIN) 10 MG tablet Take 10 mg by mouth daily as needed for allergies.    [provider]  methocarbamol (ROBAXIN-750) 750 MG tablet Take 1 tablet (750 mg total) by mouth every 6 (six) hours as needed for muscle spasms. 08/23/20   Haddix, Thomasene Lot, MD  metoCLOPramide (REGLAN) 5 MG tablet Take 1 tablet (5 mg total) by mouth 4 (four) times daily -  before meals and at bedtime. 12/04/20   Bonnielee Haff, MD  metoprolol tartrate (LOPRESSOR) 25 MG tablet Take 12.5 mg by mouth 2 (two) times daily. 10/25/19   [provider]  pantoprazole (PROTONIX) 40 MG tablet Take 40 mg by mouth daily. 12/29/18   [provider]  Polyethyl Glycol-Propyl Glycol (SYSTANE) 0.4-0.3 % GEL ophthalmic gel Place 1 application into both eyes 3 (three) times daily as needed (dry/irritated eyes.).     [provider]  polyethylene glycol (MIRALAX / GLYCOLAX) 17 g packet Take 17 g by mouth daily. 12/05/20   Bonnielee Haff, MD  pregabalin (LYRICA) 75 MG capsule Take 75 mg by mouth 2 (two) times daily. 02/11/19   [provider]  ranolazine (RANEXA) 500 MG 12 hr tablet TAKE 1 TABLET BY MOUTH TWICE A DAY 04/16/21   Park Liter, MD  senna-docusate (SENOKOT-S) 8.6-50 MG tablet Take 2 tablets by mouth 2 (two) times daily. 12/04/20   Bonnielee Haff, MD  sertraline (ZOLOFT) 100 MG tablet Take 100 mg by mouth daily. 01/27/19   [provider]  TRULICITY 1.5  0000000 SOPN Inject 1.5 mg  into the skin every Monday.  02/07/19   [provider]    Allergies    Patient has no known allergies.  Review of Systems   Review of Systems  Constitutional:  Negative for chills and fever.  HENT: Negative.    Respiratory: Negative.    Cardiovascular: Negative.   Gastrointestinal: Negative.   Musculoskeletal: Negative.   Skin: Negative.   Neurological: Negative.   Psychiatric/Behavioral:  Positive for hallucinations and suicidal ideas.    Physical Exam Updated Vital Signs BP (!) 162/65 (BP Location: Left Arm)   Pulse 72   Temp 98.6 F (37 C)   Resp 15   LMP  (LMP Unknown) Comment: hysterectomy 2014  SpO2 100%   Physical Exam Constitutional:      Appearance: She is well-developed.  HENT:     Head: Normocephalic and atraumatic.  Eyes:     Comments: Right eye disfigured (chronic, blind)  Cardiovascular:     Rate and Rhythm: Normal rate and regular rhythm.     Heart sounds: No murmur heard. Pulmonary:     Effort: Pulmonary effort is normal.     Breath sounds: Normal breath sounds. No wheezing, rhonchi or rales.  Abdominal:     General: Bowel sounds are normal.     Palpations: Abdomen is soft.     Tenderness: There is no abdominal tenderness. There is no guarding or rebound.  Musculoskeletal:        General: Normal range of motion.     Cervical back: Normal range of motion and neck supple.  Skin:    General: Skin is warm and dry.  Neurological:     General: No focal deficit present.     Mental Status: She is alert and oriented to person, place, and time.  Psychiatric:        Attention and Perception: She perceives visual hallucinations.        Mood and Affect: Affect is flat.        Speech: Speech normal.        Behavior: Behavior is cooperative.        Thought Content: Thought content includes suicidal ideation.    ED Results / Procedures / Treatments   Labs (all labs ordered are listed, but only abnormal results are  displayed) Labs Reviewed - No data to display  EKG None  Radiology No results found.  Procedures Procedures   Medications Ordered in ED Medications - No data to display  ED Course  I have reviewed the triage vital signs and the nursing notes.  Pertinent labs & imaging results that were available during my care of the patient were reviewed by me and considered in my medical decision making (see chart for details).    MDM Rules/Calculators/A&P                         CRITICAL CARE Performed by: Dewaine Oats   Total critical care time: 45 minutes  Critical care time was exclusive of separately billable procedures and treating other patients.  Critical care was necessary to treat or prevent imminent or life-threatening deterioration.  Critical care was time spent personally by me on the following activities: development of treatment plan with patient and/or surrogate as well as nursing, discussions with consultants, evaluation of patient's response to treatment, examination of patient, obtaining history from patient or surrogate, ordering and performing treatments and interventions, ordering and review of laboratory studies, ordering and review of radiographic studies,  pulse oximetry and re-evaluation of patient's condition.   Patient to ED with  several month history of hallucinations, now SI.   Patient actively hallucinating, grabbing at things in the air, appears to be wiping at her skin like she feels there are bugs crawling on her. Haldol 2 mg IV ordered. Will continue to observe.   Patient moved from triage holding to a room to await TTS consultation. Comfortable, resting.   VSS. TTS consult completed and she is found to meet inpatient criteria. Bed placement being sought.   Final Clinical Impression(s) / ED Diagnoses Final diagnoses:  None   Visual hallucinations Suicidal ideations  Rx / DC Orders ED Discharge Orders     None        Charlann Lange,  PA-C 07/30/21 0408    Merryl Hacker, MD 07/30/21 [phone]

## 2021-07-30 DIAGNOSIS — R441 Visual hallucinations: Secondary | ICD-10-CM | POA: Diagnosis not present

## 2021-07-30 LAB — CBC WITH DIFFERENTIAL/PLATELET
Abs Immature Granulocytes: 0.01 10*3/uL (ref 0.00–0.07)
Basophils Absolute: 0 10*3/uL (ref 0.0–0.1)
Basophils Relative: 0 %
Eosinophils Absolute: 0.1 10*3/uL (ref 0.0–0.5)
Eosinophils Relative: 1 %
HCT: 36.6 % (ref 36.0–46.0)
Hemoglobin: 11.1 g/dL — ABNORMAL LOW (ref 12.0–15.0)
Immature Granulocytes: 0 %
Lymphocytes Relative: 16 %
Lymphs Abs: 0.6 10*3/uL — ABNORMAL LOW (ref 0.7–4.0)
MCH: 26.6 pg (ref 26.0–34.0)
MCHC: 30.3 g/dL (ref 30.0–36.0)
MCV: 87.6 fL (ref 80.0–100.0)
Monocytes Absolute: 0.3 10*3/uL (ref 0.1–1.0)
Monocytes Relative: 8 %
Neutro Abs: 2.6 10*3/uL (ref 1.7–7.7)
Neutrophils Relative %: 75 %
Platelets: 172 10*3/uL (ref 150–400)
RBC: 4.18 MIL/uL (ref 3.87–5.11)
RDW: 14.4 % (ref 11.5–15.5)
WBC: 3.5 10*3/uL — ABNORMAL LOW (ref 4.0–10.5)
nRBC: 0 % (ref 0.0–0.2)

## 2021-07-30 LAB — COMPREHENSIVE METABOLIC PANEL
ALT: 12 U/L (ref 0–44)
AST: 16 U/L (ref 15–41)
Albumin: 3.7 g/dL (ref 3.5–5.0)
Alkaline Phosphatase: 130 U/L — ABNORMAL HIGH (ref 38–126)
Anion gap: 8 (ref 5–15)
BUN: 32 mg/dL — ABNORMAL HIGH (ref 6–20)
CO2: 25 mmol/L (ref 22–32)
Calcium: 9.2 mg/dL (ref 8.9–10.3)
Chloride: 104 mmol/L (ref 98–111)
Creatinine, Ser: 2.14 mg/dL — ABNORMAL HIGH (ref 0.44–1.00)
GFR, Estimated: 28 mL/min — ABNORMAL LOW (ref >60)
Glucose, Bld: 137 mg/dL — ABNORMAL HIGH (ref 70–99)
Potassium: 5.1 mmol/L (ref 3.5–5.1)
Sodium: 137 mmol/L (ref 135–145)
Total Bilirubin: 0.4 mg/dL (ref 0.3–1.2)
Total Protein: 7.1 g/dL (ref 6.5–8.1)

## 2021-07-30 LAB — CBG MONITORING, ED
Glucose-Capillary: 116 mg/dL — ABNORMAL HIGH (ref 70–99)
Glucose-Capillary: 147 mg/dL — ABNORMAL HIGH (ref 70–99)
Glucose-Capillary: 229 mg/dL — ABNORMAL HIGH (ref 70–99)
Glucose-Capillary: 288 mg/dL — ABNORMAL HIGH (ref 70–99)
Glucose-Capillary: 78 mg/dL (ref 70–99)

## 2021-07-30 LAB — ACETAMINOPHEN LEVEL: Acetaminophen (Tylenol), Serum: <10 ug/mL — ABNORMAL LOW (ref 10–30)

## 2021-07-30 LAB — RESP PANEL BY RT-PCR (FLU A&B, COVID) ARPGX2
Influenza A by PCR: NEGATIVE
Influenza B by PCR: NEGATIVE
SARS Coronavirus 2 by RT PCR: NEGATIVE

## 2021-07-30 LAB — RAPID URINE DRUG SCREEN, HOSP PERFORMED
Amphetamines: NOT DETECTED
Barbiturates: NOT DETECTED
Benzodiazepines: POSITIVE — AB
Cocaine: NOT DETECTED
Opiates: NOT DETECTED
Tetrahydrocannabinol: NOT DETECTED

## 2021-07-30 LAB — ETHANOL: Alcohol, Ethyl (B): <10 mg/dL (ref <10)

## 2021-07-30 LAB — SALICYLATE LEVEL: Salicylate Lvl: <7 mg/dL — ABNORMAL LOW (ref 7.0–30.0)

## 2021-07-30 MED ORDER — INSULIN ASPART 100 UNIT/ML IJ SOLN
0.0000 [IU] | Freq: Three times a day (TID) | INTRAMUSCULAR | Status: DC
Start: 2021-07-31 — End: 2021-07-31
  Administered 2021-07-30: 3 [IU] via SUBCUTANEOUS
  Administered 2021-07-31: 15 [IU] via SUBCUTANEOUS
  Administered 2021-07-31: 11 [IU] via SUBCUTANEOUS

## 2021-07-30 MED ORDER — PREGABALIN 75 MG PO CAPS
75.0000 mg | ORAL_CAPSULE | Freq: Two times a day (BID) | ORAL | Status: DC
  Administered 2021-07-30 – 2021-08-05 (×13): 75 mg via ORAL
  Filled 2021-07-30 (×6): qty 3
  Filled 2021-07-30: qty 1
  Filled 2021-07-30 (×5): qty 3

## 2021-07-30 MED ORDER — SENNOSIDES-DOCUSATE SODIUM 8.6-50 MG PO TABS
2.0000 | ORAL_TABLET | Freq: Two times a day (BID) | ORAL | Status: DC
  Administered 2021-07-30 – 2021-08-06 (×15): 2 via ORAL
  Filled 2021-07-30 (×15): qty 2

## 2021-07-30 MED ORDER — RANOLAZINE ER 500 MG PO TB12
500.0000 mg | ORAL_TABLET | Freq: Two times a day (BID) | ORAL | Status: DC
  Administered 2021-07-30 – 2021-08-06 (×15): 500 mg via ORAL
  Filled 2021-07-30 (×17): qty 1

## 2021-07-30 MED ORDER — LEVOTHYROXINE SODIUM 50 MCG PO TABS
50.0000 ug | ORAL_TABLET | Freq: Every day | ORAL | Status: DC
  Administered 2021-07-31 – 2021-08-06 (×7): 50 ug via ORAL
  Filled 2021-07-30 (×3): qty 1
  Filled 2021-07-30: qty 2
  Filled 2021-07-30 (×3): qty 1

## 2021-07-30 MED ORDER — BUPROPION HCL ER (XL) 300 MG PO TB24: 300.0000 mg | ORAL_TABLET | Freq: Every day | ORAL | Status: DC

## 2021-07-30 MED ORDER — METOPROLOL TARTRATE 25 MG PO TABS
25.0000 mg | ORAL_TABLET | Freq: Every day | ORAL | Status: DC
  Administered 2021-07-30 – 2021-08-06 (×8): 25 mg via ORAL
  Filled 2021-07-30 (×8): qty 1

## 2021-07-30 MED ORDER — ATORVASTATIN CALCIUM 80 MG PO TABS
80.0000 mg | ORAL_TABLET | Freq: Every day | ORAL | Status: DC
  Administered 2021-07-30 – 2021-08-05 (×7): 80 mg via ORAL
  Filled 2021-07-30 (×7): qty 1

## 2021-07-30 MED ORDER — ASPIRIN EC 81 MG PO TBEC
81.0000 mg | DELAYED_RELEASE_TABLET | Freq: Every day | ORAL | Status: DC
  Administered 2021-07-30 – 2021-08-05 (×7): 81 mg via ORAL
  Filled 2021-07-30 (×7): qty 1

## 2021-07-30 MED ORDER — PANTOPRAZOLE SODIUM 40 MG PO TBEC
40.0000 mg | DELAYED_RELEASE_TABLET | Freq: Every day | ORAL | Status: DC
  Administered 2021-07-30 – 2021-08-05 (×7): 40 mg via ORAL
  Filled 2021-07-30 (×7): qty 1

## 2021-07-30 MED ORDER — LACTULOSE 10 GM/15ML PO SOLN: 20.0000 g | Freq: Two times a day (BID) | ORAL | Status: DC | PRN

## 2021-07-30 MED ORDER — SERTRALINE HCL 100 MG PO TABS
100.0000 mg | ORAL_TABLET | Freq: Every day | ORAL | Status: DC
  Administered 2021-07-30 – 2021-08-05 (×7): 100 mg via ORAL
  Filled 2021-07-30 (×7): qty 1

## 2021-07-30 MED ORDER — ALPRAZOLAM 0.25 MG PO TABS
0.2500 mg | ORAL_TABLET | Freq: Four times a day (QID) | ORAL | Status: DC | PRN
  Administered 2021-07-30 – 2021-08-06 (×5): 0.25 mg via ORAL
  Filled 2021-07-30 (×6): qty 1

## 2021-07-30 MED ORDER — METOCLOPRAMIDE HCL 5 MG PO TABS
5.0000 mg | ORAL_TABLET | Freq: Three times a day (TID) | ORAL | Status: DC
  Administered 2021-07-30 – 2021-08-06 (×26): 5 mg via ORAL
  Filled 2021-07-30 (×36): qty 1

## 2021-07-30 MED ORDER — CLOPIDOGREL BISULFATE 75 MG PO TABS
75.0000 mg | ORAL_TABLET | Freq: Every day | ORAL | Status: DC
  Administered 2021-07-30 – 2021-08-06 (×8): 75 mg via ORAL
  Filled 2021-07-30 (×8): qty 1

## 2021-07-30 MED ORDER — POLYETHYL GLYCOL-PROPYL GLYCOL 0.4-0.3 % OP GEL: 1.0000 "application " | Freq: Three times a day (TID) | OPHTHALMIC | Status: DC | PRN

## 2021-07-30 MED ORDER — LORATADINE 10 MG PO TABS: 10.0000 mg | ORAL_TABLET | Freq: Every day | ORAL | Status: DC | PRN

## 2021-07-30 MED ORDER — BUPROPION HCL ER (XL) 300 MG PO TB24
300.0000 mg | ORAL_TABLET | Freq: Every day | ORAL | Status: DC
  Administered 2021-07-30 – 2021-08-04 (×6): 300 mg via ORAL
  Filled 2021-07-30 (×6): qty 1

## 2021-07-30 MED ORDER — INSULIN ASPART 100 UNIT/ML IJ SOLN
0.0000 [IU] | Freq: Every day | INTRAMUSCULAR | Status: DC
Start: 2021-07-30 — End: 2021-08-06
  Administered 2021-07-30: 3 [IU] via SUBCUTANEOUS
  Administered 2021-08-03: 4 [IU] via SUBCUTANEOUS

## 2021-07-30 NOTE — ED Notes (Signed)
Pt talking with TTS  
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
## 2021-07-30 NOTE — Progress Notes (Signed)
Patient has been recommended for [facility] inpatient and has been faxed out. Patient meets inpatient criteria per [facility]. Patient referred to the following facilities:  [facility]  [address] [place] [address] [phone] [place]  [address], [place] [address] [phone]  [facility]  [address], [place] [address] [phone] [place]  [address] [place] [address] [phone] [facility]  [address] [place] [address] [phone] [facility]  [place], [place] [address] [phone]  [facility]  [address] [place], [place] [address] [phone]  [facility]  [phone] [place] [address] [phone] [facility]  [address] [place] [address] [phone] [facility]  [address], [place] [address] [phone]  [facility]  [address] [place] [address] [place]  [facility]  [address] [place] [address] [phone]    CSW will continue to monitor disposition.    Mariea Clonts, MSW, LCSW-A  11:25 AM 07/30/2021

## 2021-07-30 NOTE — ED Notes (Signed)
Patient states she was starting to see bugs everywhere again. Patient was reassured that there were no bugs anywhere. Patient stated she will just closed her eyes. Patient also stated she was not able to eat her food because bugs were all in food however patient ate 100% of meal.

## 2021-07-30 NOTE — ED Notes (Signed)
Pt ambulated to restroom with staff assistance. 

## 2021-07-30 NOTE — ED Notes (Signed)
Family updated as to patient's status with patient's consent. This RN updated Nita Sickle on patient status and condition.

## 2021-07-30 NOTE — ED Notes (Signed)
Pt reports feeling the bugs on her again and getting in her eyes. Redirected pt to close her eyes as that made them go away. Pt able to follow commands. Pt with relief at this time.

## 2021-07-30 NOTE — ED Notes (Signed)
Pt changed into burgundy scrubs, wanded by security, and all belongings were collected. Pt resting in triage.

## 2021-07-30 NOTE — ED Notes (Signed)
Deanna Landry fiance [phone] would like an update on the patient

## 2021-07-30 NOTE — ED Notes (Signed)
Patient resting in stretcher comfortably. Eyes closed, Equal chest rise and fall. Patient alert to verbal stimuli. Call bell in reach, Stretcher in low and locked position. Side rails up x2.   

## 2021-07-30 NOTE — ED Notes (Signed)
Pt given Meal bag and drink. Reports haven't eaten anything all day

## 2021-07-31 DIAGNOSIS — R441 Visual hallucinations: Secondary | ICD-10-CM | POA: Diagnosis not present

## 2021-07-31 DIAGNOSIS — R45851 Suicidal ideations: Secondary | ICD-10-CM | POA: Diagnosis not present

## 2021-07-31 LAB — CBG MONITORING, ED
Glucose-Capillary: 267 mg/dL — ABNORMAL HIGH (ref 70–99)
Glucose-Capillary: 331 mg/dL — ABNORMAL HIGH (ref 70–99)
Glucose-Capillary: 82 mg/dL (ref 70–99)

## 2021-07-31 MED ORDER — QUETIAPINE FUMARATE 25 MG PO TABS
25.0000 mg | ORAL_TABLET | Freq: Two times a day (BID) | ORAL | Status: DC
  Administered 2021-07-31 – 2021-08-01 (×4): 25 mg via ORAL
  Filled 2021-07-31 (×4): qty 1

## 2021-07-31 MED ORDER — INSULIN ASPART 100 UNIT/ML IJ SOLN
0.0000 [IU] | Freq: Three times a day (TID) | INTRAMUSCULAR | Status: DC
Start: 2021-07-31 — End: 2021-08-06
  Administered 2021-08-01: 15 [IU] via SUBCUTANEOUS
  Administered 2021-08-01: 7 [IU] via SUBCUTANEOUS
  Administered 2021-08-01: 11 [IU] via SUBCUTANEOUS
  Administered 2021-08-02: 20 [IU] via SUBCUTANEOUS
  Administered 2021-08-03: 15 [IU] via SUBCUTANEOUS
  Administered 2021-08-03: 20 [IU] via SUBCUTANEOUS
  Administered 2021-08-05: 7 [IU] via SUBCUTANEOUS
  Administered 2021-08-05: 9 [IU] via SUBCUTANEOUS
  Administered 2021-08-06: 3 [IU] via SUBCUTANEOUS

## 2021-07-31 MED ORDER — INSULIN GLARGINE-YFGN 100 UNIT/ML ~~LOC~~ SOLN
12.0000 [IU] | Freq: Every day | SUBCUTANEOUS | Status: DC
  Administered 2021-07-31 – 2021-08-06 (×7): 12 [IU] via SUBCUTANEOUS
  Filled 2021-07-31 (×8): qty 0.12

## 2021-07-31 NOTE — Progress Notes (Signed)
Inpatient Diabetes Program Recommendations  AACE/ADA: New Consensus Statement on Inpatient Glycemic Control (2015)  Target Ranges:  Prepandial:   less than 140 mg/dL      Peak postprandial:   less than 180 mg/dL (1-2 hours)      Critically ill patients:  140 - 180 mg/dL   Lab Results  Component Value Date   GLUCAP 331 (H) 07/31/2021   HGBA1C 6.8 (A) 06/05/2021    Review of Glycemic Control  Diabetes history: DM1 Outpatient Diabetes medications: Insulin pump (see below) Current orders for Inpatient glycemic control: Novolog 0-20 units TID with meals and 0-5 HS  She is on the T-slim pump started on 03/04/21     Basal rates: 12 AM- 2 AM = 0.60, 2 AM-8 AM = 0.65, 8 AM-12 AM = 0.50 ISF: 1: 60 except from 2-8 AM, mealtime carbohydrate ratio 1:1 Target 120 Active insulin 5 hours   Non-insulin hypoglycemic drugs: Trulicity 1.5 mg weekly   A1c is stable at 6.8, was 6.9  Spoke with pt at bedside. Discussed recs with RN. Took pump off yesterday afternoon. Needs basal insulin to start now.  Inpatient Diabetes Program Recommendations:    Lantus 12 units QD ( to start now) Novolog 0-9 units TID with meals and 0-5 HS Novolog 4 units TID with meals if eating > 50%.  Will follow.  Thank you. Lorenda Peck, RD, LDN, CDE Inpatient Diabetes Coordinator [phone]

## 2021-07-31 NOTE — ED Notes (Signed)
Patient resting in stretcher comfortably. Eyes closed, Equal chest rise and fall. Patient alert to verbal stimuli. Call bell in reach, Stretcher in low and locked position. Side rails up x2.   

## 2021-07-31 NOTE — Consult Note (Signed)
  Patient is seen and reassessed at this time. Patient continues to endorse visual hallucinations " I see bugs when I open my eyes. " Patient also continues to endorse suicidal ideations, with inability to contract for safety. She states " I do not think things will get better so Yes I do have suicidal thoughts. "   At this time patient continues to endorse suicidal thoughts and active visual hallucinations. She is under review at [facility].   -Continue current medications.  -Will recommend medication adjustment that includes decreasing Wellbutrin as it can be activating for some individuals, and starting low dose Seroquel '25mg'$  po qhs.

## 2021-07-31 NOTE — ED Notes (Signed)
Pt observed to have an undocumented 20 G IV in right FA.  I documented and have not removed it yet at this time.

## 2021-07-31 NOTE — ED Notes (Addendum)
Lab called to state there Is not enough urine in sample so we will need a new sample. Pt is aware and will provide a sample asap.

## 2021-07-31 NOTE — ED Provider Notes (Signed)
Emergency Medicine Observation Re-evaluation Note  Deanna Landry is a 50 y.o. female, seen on rounds today.  Pt initially presented to the ED for complaints of Suicidal, Hypertension, and Hypoglycemia Currently, the patient is resting.  Physical Exam  BP (!) 140/54 (BP Location: Left Arm)   Pulse 65   Temp 97.8 F (36.6 C) (Oral)   Resp 18   LMP  (LMP Unknown) Comment: hysterectomy 2014  SpO2 100%  Physical Exam General: resting comfortably, NAD Lungs: normal WOB Psych: currently calm and resting  ED Course / MDM  EKG:   I have reviewed the labs performed to date as well as medications administered while in observation.  Recent changes in the last 24 hours include inpatient treatment recommended.  Plan  Current plan is for inpatient psychiatric placement, disposition per CSW.  Deanna Landry is not under involuntary commitment.     Deanna Landry, [place] 07/31/21 [phone]

## 2021-07-31 NOTE — Progress Notes (Signed)
CSW followed up with care team via secure chat to inquire about pending UA. At [facility] nursing staff; Oretha Caprice noted that the lab called to inform that they would need more urine to complete the analysis. It was documented that pt agreed to provide another urine sample. Farris Has Starkes-Perry,FNP agreed to reach out to floor nursing staff for an update and follow back up. At this time CSW is still awaiting pending UA in order for pt to counite to be reviewed with [facility].  CSW communicated with [place] and spoke with [facility] who advised that the facility is still willing to review pt and still awaiting updated UA. CSW informed that CSW or CSW within [facility] Team would fax UA to [phone]. CSW will continue to assist and follow with inpatient behavioral health placement.   Benjaman Kindler, MSW, [facility] 07/31/2021 10:19 PM

## 2021-07-31 NOTE — ED Notes (Signed)
Breakfast Ordered 

## 2021-07-31 NOTE — ED Notes (Signed)
Belongings placed in locker 12. Security called for valuables.

## 2021-08-01 ENCOUNTER — Other Ambulatory Visit: Payer: Self-pay

## 2021-08-01 DIAGNOSIS — R45851 Suicidal ideations: Secondary | ICD-10-CM | POA: Diagnosis not present

## 2021-08-01 DIAGNOSIS — R441 Visual hallucinations: Secondary | ICD-10-CM | POA: Diagnosis not present

## 2021-08-01 LAB — CBG MONITORING, ED
Glucose-Capillary: 331 mg/dL — ABNORMAL HIGH (ref 70–99)
Glucose-Capillary: 259 mg/dL — ABNORMAL HIGH (ref 70–99)
Glucose-Capillary: 173 mg/dL — ABNORMAL HIGH (ref 70–99)
Glucose-Capillary: 235 mg/dL — ABNORMAL HIGH (ref 70–99)
Glucose-Capillary: 121 mg/dL — ABNORMAL HIGH (ref 70–99)

## 2021-08-01 LAB — URINALYSIS, ROUTINE W REFLEX MICROSCOPIC
Bacteria, UA: NONE SEEN
Bilirubin Urine: NEGATIVE
Glucose, UA: >=500 mg/dL — AB
Hgb urine dipstick: NEGATIVE
Ketones, ur: NEGATIVE mg/dL
Leukocytes,Ua: NEGATIVE
Nitrite: NEGATIVE
Protein, ur: NEGATIVE mg/dL
Specific Gravity, Urine: 1.009 (ref 1.005–1.030)
pH: 5 (ref 5.0–8.0)

## 2021-08-01 NOTE — ED Notes (Signed)
Can you please inform the patient to call the mother tmrw after 9:30 am  ( if allowed) , her mother just would like to see how she is doing. Thank you

## 2021-08-01 NOTE — Progress Notes (Signed)
Inpatient Diabetes Program Recommendations  AACE/ADA: New Consensus Statement on Inpatient Glycemic Control (2015)  Target Ranges:  Prepandial:   less than 140 mg/dL      Peak postprandial:   less than 180 mg/dL (1-2 hours)      Critically ill patients:  140 - 180 mg/dL   Lab Results  Component Value Date   GLUCAP 331 (H) 08/01/2021   HGBA1C 6.8 (A) 06/05/2021    Review of Glycemic Control  Diabetes history: DM1 Outpatient Diabetes medications: Insulin pump Current orders for Inpatient glycemic control: Semglee 12 units QD, Novolog 0-9 units TID with meals and 0-5 HS  HgbA1C - 6.8%  Inpatient Diabetes Program Recommendations:    Add Novolog 4 units TID with meals if eating > 50%.  Continue to follow.   Thank you. Lorenda Peck, RD, LDN, CDE Inpatient Diabetes Coordinator [phone]

## 2021-08-01 NOTE — Progress Notes (Signed)
CSW contacted [facility], who reports patient has been denied due to no bed availability. CSW will continue to pursue alternative placement. Situation ongoing, CSW will continue to monitor and update note as more information becomes available.   Signed:  Durenda Hurt, MSW, [place], LCASA 08/01/2021 7:39 PM

## 2021-08-01 NOTE — ED Notes (Signed)
Pt still sleeping at this time. Will update vitals and check cbg when she wakes.

## 2021-08-01 NOTE — ED Provider Notes (Signed)
Emergency Medicine Observation Re-evaluation Note  Brecklynn D Stooksbury is a 50 y.o. female, seen on rounds today.  Pt initially presented to the ED for complaints of Suicidal, Hypertension, and Hypoglycemia Currently, the patient is resting comfortably.  Physical Exam  BP (!) 104/38 (BP Location: Left Arm)   Pulse 65   Temp 97.9 F (36.6 C) (Oral)   Resp 18   LMP  (LMP Unknown) Comment: hysterectomy 2014  SpO2 94%  Physical Exam General: NAD   ED Course / MDM  EKG:   I have reviewed the labs performed to date as well as medications administered while in observation.  Recent changes in the last 24 hours include no acute events reported.  Plan  Current plan is for inpatient psych placement.  Treasa D Kranich is not under involuntary commitment.     Valarie Merino, MD 08/01/21 [phone]

## 2021-08-02 DIAGNOSIS — R441 Visual hallucinations: Secondary | ICD-10-CM | POA: Diagnosis not present

## 2021-08-02 LAB — CBG MONITORING, ED
Glucose-Capillary: 274 mg/dL — ABNORMAL HIGH (ref 70–99)
Glucose-Capillary: 433 mg/dL — ABNORMAL HIGH (ref 70–99)
Glucose-Capillary: 445 mg/dL — ABNORMAL HIGH (ref 70–99)
Glucose-Capillary: 186 mg/dL — ABNORMAL HIGH (ref 70–99)
Glucose-Capillary: 90 mg/dL (ref 70–99)
Glucose-Capillary: 115 mg/dL — ABNORMAL HIGH (ref 70–99)

## 2021-08-02 MED ORDER — INSULIN ASPART 100 UNIT/ML IJ SOLN
7.0000 [IU] | Freq: Once | INTRAMUSCULAR | Status: AC
  Administered 2021-08-02: 7 [IU] via SUBCUTANEOUS

## 2021-08-02 MED ORDER — QUETIAPINE FUMARATE 50 MG PO TABS
50.0000 mg | ORAL_TABLET | Freq: Two times a day (BID) | ORAL | Status: DC
  Administered 2021-08-02 – 2021-08-04 (×6): 50 mg via ORAL
  Filled 2021-08-02 (×6): qty 1

## 2021-08-02 NOTE — ED Notes (Signed)
Breakfast tray arrived  

## 2021-08-02 NOTE — ED Notes (Signed)
Dinner arrived. 

## 2021-08-02 NOTE — ED Notes (Signed)
Service response called regarding missing breakfast tray. There was an issue with the breakfast order; agent reordered tray and placed a rush on it. Insulin held do to patient not eating yet.

## 2021-08-02 NOTE — Progress Notes (Signed)
Per [facility], patient meets criteria for inpatient treatment. There are no available or appropriate beds at [facility] today. CSW faxed referrals to the following facilities for review:  [facility] [address], [place] [address] [phone] --  [facility] [address] [place] [address] [phone] --  [facility]  Pending - Request Sent N/A [place] [place] [address] [phone] --  [facility] [address] [place] [address] [phone] --  [facility]  Pending - Request Sent N/A [address], [place] [address] [phone] --  [facility]  Pending - Request Sent N/A [place], [place] [address] [phone] --  [facility]  Pending - Request Sent N/A [address] [place] [address] [phone] --  [place] [id number] [place] [address] [place] --  [facility]  Pending - Request Sent N/A [address] [place] [address] [phone] --  [place] [address] [place] [address] [phone] --  [facility]  Pending - Request Sent N/A [address] [place] [address] [phone] --  [facility]  Declined  No bed availablity N/A [address] [place] [address] [phone] --   TTS will continue to seek bed placement.  Glennie [place], MSW, [place], LCAS-A Phone: [phone] Disposition/TOC

## 2021-08-02 NOTE — ED Notes (Signed)
Breakfast missing from cart. Service response called.

## 2021-08-02 NOTE — Consult Note (Signed)
Patient is seen and reassessed at this time. Patient continues to endorse visual hallucinations " I am ok but I am still seeing bugs. One has drew up that heirloom."  She Is observed to be responding to internal stimuli and external stimuli. She is picking bugs off the night stand and has picked out some of her hair at this time due to bugs being in her hair.   At this time patient continues to endorse suicidal thoughts and active visual hallucinations.   -Will adjust medications to target psychosis.  -Patient continues to meet inpatient criteria.

## 2021-08-02 NOTE — ED Notes (Signed)
Patient showering at this time. 

## 2021-08-02 NOTE — ED Notes (Signed)
Patient speaking to fiance at this time.

## 2021-08-02 NOTE — ED Notes (Signed)
Lunch arrived.

## 2021-08-02 NOTE — ED Notes (Signed)
Patient gave this RN permission to update fiance, Alvester Chou [place], on her status and plan of care. Alvester Chou added as an emergency contact and updated with most current patient status. Alvester Chou requested we call with any changes.

## 2021-08-03 DIAGNOSIS — R441 Visual hallucinations: Secondary | ICD-10-CM | POA: Diagnosis not present

## 2021-08-03 LAB — CBG MONITORING, ED
Glucose-Capillary: 368 mg/dL — ABNORMAL HIGH (ref 70–99)
Glucose-Capillary: 126 mg/dL — ABNORMAL HIGH (ref 70–99)
Glucose-Capillary: 305 mg/dL — ABNORMAL HIGH (ref 70–99)
Glucose-Capillary: 333 mg/dL — ABNORMAL HIGH (ref 70–99)

## 2021-08-03 MED ORDER — PREGABALIN 25 MG PO CAPS
75.0000 mg | ORAL_CAPSULE | Freq: Once | ORAL | Status: AC
  Administered 2021-08-03: 75 mg via ORAL
  Filled 2021-08-03: qty 3

## 2021-08-03 NOTE — ED Notes (Signed)
Pt currently in the shower.

## 2021-08-03 NOTE — BHH Counselor (Signed)
Pt is a 50 year old female who remains at [facility] due to visual and somatic hallucinations.  Pt was reassessed today.  She said that she was not doing well overall.  She described seeing numerous bugs crawling on her and also burrowing under her skin.  Pt said that it is so upsetting that she will commit suicide if she does not get help.  Recommend continued inpatient placement.

## 2021-08-04 ENCOUNTER — Encounter (HOSPITAL_COMMUNITY): Payer: Self-pay | Admitting: Registered Nurse

## 2021-08-04 DIAGNOSIS — R441 Visual hallucinations: Secondary | ICD-10-CM | POA: Diagnosis not present

## 2021-08-04 DIAGNOSIS — R45851 Suicidal ideations: Secondary | ICD-10-CM | POA: Diagnosis not present

## 2021-08-04 DIAGNOSIS — R443 Hallucinations, unspecified: Secondary | ICD-10-CM

## 2021-08-04 DIAGNOSIS — F333 Major depressive disorder, recurrent, severe with psychotic symptoms: Secondary | ICD-10-CM | POA: Diagnosis not present

## 2021-08-04 LAB — CBG MONITORING, ED
Glucose-Capillary: 32 mg/dL — CL (ref 70–99)
Glucose-Capillary: 50 mg/dL — ABNORMAL LOW (ref 70–99)
Glucose-Capillary: 84 mg/dL (ref 70–99)
Glucose-Capillary: 376 mg/dL — ABNORMAL HIGH (ref 70–99)
Glucose-Capillary: 486 mg/dL — ABNORMAL HIGH (ref 70–99)
Glucose-Capillary: >600 mg/dL (ref 70–99)
Glucose-Capillary: 568 mg/dL (ref 70–99)
Glucose-Capillary: 332 mg/dL — ABNORMAL HIGH (ref 70–99)
Glucose-Capillary: 261 mg/dL — ABNORMAL HIGH (ref 70–99)
Glucose-Capillary: 49 mg/dL — ABNORMAL LOW (ref 70–99)
Glucose-Capillary: 59 mg/dL — ABNORMAL LOW (ref 70–99)
Glucose-Capillary: 97 mg/dL (ref 70–99)
Glucose-Capillary: 135 mg/dL — ABNORMAL HIGH (ref 70–99)

## 2021-08-04 MED ORDER — INSULIN ASPART 100 UNIT/ML IJ SOLN
10.0000 [IU] | Freq: Once | INTRAMUSCULAR | Status: AC
  Administered 2021-08-04: 10 [IU] via SUBCUTANEOUS

## 2021-08-04 MED ORDER — INSULIN ASPART 100 UNIT/ML IJ SOLN
4.0000 [IU] | Freq: Three times a day (TID) | INTRAMUSCULAR | Status: DC
  Administered 2021-08-04 – 2021-08-06 (×4): 4 [IU] via SUBCUTANEOUS

## 2021-08-04 NOTE — Progress Notes (Addendum)
Inpatient Diabetes Program Recommendations  AACE/ADA: New Consensus Statement on Inpatient Glycemic Control (2015)  Target Ranges:  Prepandial:   less than 140 mg/dL      Peak postprandial:   less than 180 mg/dL (1-2 hours)      Critically ill patients:  140 - 180 mg/dL  Results for CANNA, SWADLEY (MRN [id number]) as of 08/04/2021 07:15  Ref. Range 08/03/2021 07:38 08/03/2021 12:42 08/03/2021 18:53 08/03/2021 21:28  Glucose-Capillary Latest Ref Range: 70 - 99 mg/dL 368 (H)  20 units Novolog  12 units Semglee '@0958'$   126 (H) 305 (H)  15 units Novolog '@2026'$  333 (H)  4 units Novolog '@2226'$   Results for NONIA, WIETECHA (MRN [id number]) as of 08/04/2021 07:15  Ref. Range 08/04/2021 03:19 08/04/2021 03:36 08/04/2021 03:54  Glucose-Capillary Latest Ref Range: 70 - 99 mg/dL 32 (LL) 50 (L) 84     History: Type 1 Diabetes  Home DM Meds: She is on the T-slim pump started on 03/04/21 Basal rates: 12 AM- 2 AM = 0.60, 2 AM-8 AM = 0.65, 8 AM-12 AM = 0.50 ISF: 1: 60 except from 2-8 AM, mealtime carbohydrate ratio 1:1 Target 120 Active insulin 5 hours  Non-insulin hypoglycemic drugs: Trulicity 1.5 mg weekly     Current Orders: Lantus 12 units Daily     Novolog Sensitive Correction Scale/ SSI (0-9 units) TID AC + HS    MD- Suspect Hypoglycemia this AM may be due to the 2 doses of Novolog Insulin patient received back to back last PM --CBG was checked at [facility] and then 20 units Novolog wasn't given to pt until 2026  --Then pt received another 4 units Novolog at 2226  Patient listed as having Type 1 diabetes and her GFR is reduced predisposing her to being very sensitive to insulin and also predisposing her to Hypoglycemia.  Please consider:  1. Reduce Novolog SSI to the Very Sensitive scale 0-6 units TID AC + HS  2. Make sure Novolog given within at least 1 hour after CBG being checked  3. Patient needs Novolog Meal Coverage to cover her Carbohydrate Intake and prevent such wide fluctuations of her  CBGs Recommend Novolog 4 units TID with meals (give with the Novolog SSI dose)    --Will follow patient during hospitalization--  Wyn Quaker RN, MSN, CDE Diabetes Coordinator Inpatient Glycemic Control Team Team Pager: [phone] (8a-5p)

## 2021-08-04 NOTE — ED Notes (Signed)
The patient requested to her sitter to take a shower. The patient sitter secured the items needed for the shower. This EMT removed the linens from the bed, cleaned the bed with a sanitizing wipe and placed clean linens on the bed.

## 2021-08-04 NOTE — ED Notes (Signed)
CBG back up to WNL, dinner tray at bedside.

## 2021-08-04 NOTE — ED Notes (Signed)
CBG 49 - Pt caox4 in no apparent distress, denying complaints, able to swallow - apple juice provided

## 2021-08-04 NOTE — ED Notes (Signed)
Message sent to EDP with CBG .

## 2021-08-04 NOTE — ED Notes (Signed)
Patient remains A&Ox 4 and was given a snack bag with a coke-Monique,RN

## 2021-08-04 NOTE — ED Provider Notes (Signed)
Emergency Medicine Observation Re-evaluation Note  Deanna Landry is a 50 y.o. female, seen on rounds today.  Pt initially presented to the ED for complaints of Suicidal, Hypertension, and Hypoglycemia Currently, the patient is resting in no distress.  Has had issues with blood glucose control.  Physical Exam  BP (!) 123/58 (BP Location: Left Arm)   Pulse 61   Temp 98.4 F (36.9 C)   Resp 14   Ht '5\' 2"'$  (1.575 m)   Wt 86.2 kg   LMP  (LMP Unknown) Comment: hysterectomy 2014  SpO2 94%   BMI 34.75 kg/m  Physical Exam General: Calm Cardiac: Warm and well-perfused Lungs: Even nonlabored Psych: Calm at present  ED Course / MDM  EKG:   I have reviewed the labs performed to date as well as medications administered while in observation.  Recent changes in the last 24 hours include had episode of hypoglycemia and hyperglycemia.  Have asked diabetic coordinator to evaluate glucose regimen and provide recommendations.  Plan  Current plan is for inpatient psych placement.  Deanna Landry is not under involuntary commitment.     Lucrezia Starch, MD 08/04/21 1139

## 2021-08-04 NOTE — Consult Note (Signed)
Telepsych Consultation   Reason for Consult:  Hallucinations Referring Physician:  Dennie Bible Location of Patient: [facility] ED Location of Provider: Other: [facility]  Patient Identification: Deanna Landry MRN:  [id number] Principal Diagnosis: MDD (major depressive disorder), recurrent, severe, with psychosis ([place]) Diagnosis:  Principal Problem:   MDD (major depressive disorder), recurrent, severe, with psychosis ([place])   Total Time spent with patient: 30 minutes  Subjective:   Deanna Landry is a 50 y.o. female patient admitted to [facility] after presenting with complaints of visual hallucinations and suicidal ideation.  HPI:  Deanna Landry, 50 y.o., female patient seen via tele health for psychiatric reassessment by this provider, consulted with Dr. [place] Abbot; and chart reviewed on 08/04/21.  On evaluation Deanna Landry reports she came to the hospital because she was seeing bugs on her. "  People say I'm seeing things that aren't real.  I'm thinking I am seeing bugs.  I know it is not real but it feels real."  Patient reports that she continues to see and feel bugs crawling on her.  Patient reporting that the bugs are coming out of her mouth and all over her body.  Patient denies auditory hallucinations but is states her thoughts keep telling her that the bugs are not real.  Patient reports at this time she is only calm because she is in the hospital and feels safe having people around who could help her.  Patient states she does not feel she would be safe at home because of the auditory and tactile hallucinations.  Patient reports that this has been going on for 3 months until she broke down last week and became suicidal because she could not take it anymore.  Patient reporting if she was to go home at this time she would try to kill herself because she cannot take the hallucinations anymore. During evaluation Deanna Landry is sitting on side of bed facing toward the tele health machine in no acute  distress.  He/She is alert, oriented x 4, calm and cooperative.  Her mood is anxious and depressed with congruent affect.  She continues to endorse tactile and visual hallucinations of bugs being all over her and coming out of her body.   Patient denies suicidal/self-harm/homicidal ideation, psychosis, and paranoia.  Patient answered question appropriately.  Will continue to see inpatient psychiatric treatment.    Past Psychiatric History: Depression  Risk to Self:  Yes Risk to Others:  Denies Prior Inpatient Therapy:  Patient states she is unsure if ever been treated in psychiatric hospital Prior Outpatient Therapy:  Yes  Past Medical History:  Past Medical History:  Diagnosis Date   Acute kidney injury superimposed on CKD ([place]) 03/15/2019   Acute respiratory failure with hypoxia ([place]) 03/15/2019   Adjustment disorder with mixed anxiety and depressed mood 03/11/2019   Anemia    Anxiety    Arthritis    right ankle   Blind    right eye   CHF (congestive heart failure) ([place])    Closed right pilon fracture, initial encounter 07/26/2019   COPD (chronic obstructive pulmonary disease) ([place])    Coronary artery disease    Depression    Diabetes mellitus type 2 in obese ([place]) 07/26/2019   Diabetes mellitus without complication ([place])    type 1   Diabetic polyneuropathy ([place]) 05/04/2016   Dyslipidemia 01/03/2020   Fever 03/10/2019   GERD (gastroesophageal reflux disease)    History of blood transfusion  HLD (hyperlipidemia)    Hyperglycemia    Hypertension    Hypothyroidism    Ileus, postoperative ([place])    Major depression, recurrent ([place]) 03/09/2019   MDD (major depressive disorder), severe ([place]) 03/09/2019   Myocardial infarct, old    2020, s/p DES RCA, RPDA 10/23/19   Neck pain 04/21/2016   Pneumonia 10/2019   Renal disorder    stage 3   Sepsis ([place]) 03/10/2019   Short-term memory loss    mild   Sleep apnea    study done 9/27 and 08/13/20 - no results yet 08/21/20   Stroke ([place])     Suicidal ideation 03/15/2019   Type 1 diabetes mellitus without complication ([place]) [id number]   Wound infection after surgery (Right ankle) 10/04/2019    Past Surgical History:  Procedure Laterality Date   ABDOMINAL HYSTERECTOMY  2014   ANKLE FUSION Right 08/23/2020   Procedure: ANKLE FUSION;  Surgeon: Shona Needles, MD;  Location: [place];  Service: Orthopedics;  Laterality: Right;   APPLICATION OF WOUND VAC Right 10/11/2019   Procedure: WOUND VAC CHANGE TO RIGHT LEG;  Surgeon: Shona Needles, MD;  Location: [place];  Service: Orthopedics;  Laterality: Right;   CORONARY ANGIOPLASTY  10/23/2019   DES RCA, DES RPDA 10/23/19 [facility])   EYE SURGERY     HARDWARE REMOVAL Right 10/06/2019   Procedure: HARDWARE REMOVAL;  Surgeon: Shona Needles, MD;  Location: [place];  Service: Orthopedics;  Laterality: Right;   HARDWARE REMOVAL Right 08/23/2020   Procedure: HARDWARE REMOVAL;  Surgeon: Shona Needles, MD;  Location: [place];  Service: Orthopedics;  Laterality: Right;   I & D EXTREMITY Right 10/06/2019   Procedure: IRRIGATION AND DEBRIDEMENT EXTREMITY;  Surgeon: Shona Needles, MD;  Location: [place];  Service: Orthopedics;  Laterality: Right;   I & D EXTREMITY Right 10/09/2019   Procedure: IRRIGATION AND DEBRIDEMENT EXTREMITY and WOUND VAC CHANGE RIGHT ANKLE;  Surgeon: Shona Needles, MD;  Location: [place];  Service: Orthopedics;  Laterality: Right;   ORIF ANKLE FRACTURE Right 07/26/2019   Procedure: OPEN REDUCTION INTERNAL FIXATION (ORIF) ANKLE FRACTURE;  Surgeon: Shona Needles, MD;  Location: [place];  Service: Orthopedics;  Laterality: Right;  OPEN REDUCTION INTERNAL FIXATION (ORIF) ANKLE FRACTURE    TOE AMPUTATION     Family History:  Family History  Problem Relation Age of Onset   Diabetes Mellitus II Mother    Stroke Mother    Heart attack Mother    Stomach cancer Father    Congestive Heart Failure Father    Diabetes Sister    Congestive Heart Failure Maternal Grandfather    Family Psychiatric   History: Unaware Social History:  Social History   Substance and Sexual Activity  Alcohol Use Not Currently     Social History   Substance and Sexual Activity  Drug Use Not Currently    Social History   Socioeconomic History   Marital status: Divorced    Spouse name: Not on file   Number of children: Not on file   Years of education: Not on file   Highest education level: Not on file  Occupational History   Not on file  Tobacco Use   Smoking status: Never   Smokeless tobacco: Never  Vaping Use   Vaping Use: Never used  Substance and Sexual Activity   Alcohol use: Not Currently   Drug use: Not Currently   Sexual activity: Not on file    Comment: Hysterectomy  Other Topics Concern   Not on file  Social History Narrative   Not on file   Social Determinants of Health   Financial Resource Strain: Not on file  Food Insecurity: Not on file  Transportation Needs: Not on file  Physical Activity: Not on file  Stress: Not on file  Social Connections: Not on file   Additional Social History:    Allergies:  No Known Allergies  Labs:  Results for orders placed or performed during the hospital encounter of 07/29/21 (from the past 48 hour(s))  CBG monitoring, ED     Status: Abnormal   Collection Time: 08/02/21  1:59 PM  Result Value Ref Range   Glucose-Capillary 445 (H) 70 - 99 mg/dL    Comment: Glucose reference range applies only to samples taken after fasting for at least 8 hours.  POC CBG, ED     Status: Abnormal   Collection Time: 08/02/21  3:34 PM  Result Value Ref Range   Glucose-Capillary 186 (H) 70 - 99 mg/dL    Comment: Glucose reference range applies only to samples taken after fasting for at least 8 hours.  CBG monitoring, ED     Status: None   Collection Time: 08/02/21  7:47 PM  Result Value Ref Range   Glucose-Capillary 90 70 - 99 mg/dL    Comment: Glucose reference range applies only to samples taken after fasting for at least 8 hours.  CBG  monitoring, ED     Status: Abnormal   Collection Time: 08/02/21  9:14 PM  Result Value Ref Range   Glucose-Capillary 115 (H) 70 - 99 mg/dL    Comment: Glucose reference range applies only to samples taken after fasting for at least 8 hours.  CBG monitoring, ED     Status: Abnormal   Collection Time: 08/03/21  7:38 AM  Result Value Ref Range   Glucose-Capillary 368 (H) 70 - 99 mg/dL    Comment: Glucose reference range applies only to samples taken after fasting for at least 8 hours.  CBG monitoring, ED     Status: Abnormal   Collection Time: 08/03/21 12:42 PM  Result Value Ref Range   Glucose-Capillary 126 (H) 70 - 99 mg/dL    Comment: Glucose reference range applies only to samples taken after fasting for at least 8 hours.  CBG monitoring, ED     Status: Abnormal   Collection Time: 08/03/21  6:53 PM  Result Value Ref Range   Glucose-Capillary 305 (H) 70 - 99 mg/dL    Comment: Glucose reference range applies only to samples taken after fasting for at least 8 hours.  CBG monitoring, ED     Status: Abnormal   Collection Time: 08/03/21  9:28 PM  Result Value Ref Range   Glucose-Capillary 333 (H) 70 - 99 mg/dL    Comment: Glucose reference range applies only to samples taken after fasting for at least 8 hours.  CBG monitoring, ED     Status: Abnormal   Collection Time: 08/04/21  3:19 AM  Result Value Ref Range   Glucose-Capillary 32 (LL) 70 - 99 mg/dL    Comment: Glucose reference range applies only to samples taken after fasting for at least 8 hours.   Comment 1 Notify RN   CBG monitoring, ED     Status: Abnormal   Collection Time: 08/04/21  3:36 AM  Result Value Ref Range   Glucose-Capillary 50 (L) 70 - 99 mg/dL    Comment: Glucose reference range applies only to  samples taken after fasting for at least 8 hours.  CBG monitoring, ED     Status: None   Collection Time: 08/04/21  3:54 AM  Result Value Ref Range   Glucose-Capillary 84 70 - 99 mg/dL    Comment: Glucose reference range  applies only to samples taken after fasting for at least 8 hours.  CBG monitoring, ED     Status: Abnormal   Collection Time: 08/04/21  6:46 AM  Result Value Ref Range   Glucose-Capillary 376 (H) 70 - 99 mg/dL    Comment: Glucose reference range applies only to samples taken after fasting for at least 8 hours.  CBG monitoring, ED     Status: Abnormal   Collection Time: 08/04/21  7:39 AM  Result Value Ref Range   Glucose-Capillary 486 (H) 70 - 99 mg/dL    Comment: Glucose reference range applies only to samples taken after fasting for at least 8 hours.  POC CBG, ED     Status: Abnormal   Collection Time: 08/04/21 10:08 AM  Result Value Ref Range   Glucose-Capillary >600 (HH) 70 - 99 mg/dL    Comment: Glucose reference range applies only to samples taken after fasting for at least 8 hours.  POC CBG, ED     Status: Abnormal   Collection Time: 08/04/21 11:33 AM  Result Value Ref Range   Glucose-Capillary 568 (HH) 70 - 99 mg/dL    Comment: Glucose reference range applies only to samples taken after fasting for at least 8 hours.   Comment 1 Notify RN    Comment 2 Document in Chart     Medications:  Current Facility-Administered Medications  Medication Dose Route Frequency Provider Last Rate Last Admin   ALPRAZolam Duanne Moron) tablet 0.25 mg  0.25 mg Oral Q6H PRN Deno Etienne, DO   0.25 mg at 08/04/21 1030   aspirin EC tablet 81 mg  81 mg Oral QHS Deno Etienne, DO   81 mg at 08/03/21 2222   atorvastatin (LIPITOR) tablet 80 mg  80 mg Oral QHS Deno Etienne, DO   80 mg at 08/03/21 2222   buPROPion (WELLBUTRIN XL) 24 hr tablet 300 mg  300 mg Oral QHS Deno Etienne, DO   300 mg at 08/03/21 2222   clopidogrel (PLAVIX) tablet 75 mg  75 mg Oral Daily Deno Etienne, DO   75 mg at 08/04/21 1013   insulin aspart (novoLOG) injection 0-5 Units  0-5 Units Subcutaneous QHS Tegeler, Gwenyth Allegra, MD   4 Units at 08/03/21 2226   insulin aspart (novoLOG) injection 0-9 Units  0-9 Units Subcutaneous TID WC Redwine, Madison  A, PA-C   15 Units at 08/03/21 2026   insulin aspart (novoLOG) injection 4 Units  4 Units Subcutaneous TID WC Lucrezia Starch, MD       insulin glargine-yfgn [facility]) injection 12 Units  12 Units Subcutaneous Daily Redwine, Madison A, PA-C   12 Units at 08/04/21 1013   lactulose (CHRONULAC) 10 GM/15ML solution 20 g  20 g Oral BID PRN Deno Etienne, DO       levothyroxine (SYNTHROID) tablet 50 mcg  50 mcg Oral QAC breakfast Deno Etienne, DO   50 mcg at 08/04/21 0809   loratadine (CLARITIN) tablet 10 mg  10 mg Oral Daily PRN Deno Etienne, DO       metoCLOPramide (REGLAN) tablet 5 mg  5 mg Oral TID AC & HS Deno Etienne, DO   5 mg at 08/04/21 0809   metoprolol tartrate (LOPRESSOR)  tablet 25 mg  25 mg Oral Daily Deno Etienne, DO   25 mg at 08/04/21 1013   pantoprazole (PROTONIX) EC tablet 40 mg  40 mg Oral QHS Deno Etienne, DO   40 mg at 08/03/21 2221   polyethylene glycol 0.4% and propylene glycol 0.3% (SYSTANE) ophthalmic gel  1 application Both Eyes TID PRN Deno Etienne, DO       pregabalin (LYRICA) capsule 75 mg  75 mg Oral BID Deno Etienne, DO   75 mg at 08/04/21 1013   QUEtiapine (SEROQUEL) tablet 50 mg  50 mg Oral BID Suella Broad, FNP   50 mg at 08/04/21 1013   ranolazine (RANEXA) 12 hr tablet 500 mg  500 mg Oral BID Deno Etienne, DO   500 mg at 08/03/21 2223   senna-docusate (Senokot-S) tablet 2 tablet  2 tablet Oral BID Deno Etienne, DO   2 tablet at 08/04/21 1013   sertraline (ZOLOFT) tablet 100 mg  100 mg Oral QHS Deno Etienne, DO   100 mg at 08/03/21 2222   Current Outpatient Medications  Medication Sig Dispense Refill   acetaminophen (TYLENOL) 325 MG tablet Take 650 mg by mouth every 6 (six) hours as needed for mild pain.     albuterol (VENTOLIN HFA) 108 (90 Base) MCG/ACT inhaler Inhale 1-2 puffs into the lungs every 6 (six) hours as needed for wheezing or shortness of breath.     ALPRAZolam (XANAX) 0.25 MG tablet Take 0.25 mg by mouth every 6 (six) hours as needed for anxiety or sleep.      aspirin EC 81 MG tablet Take 81 mg by mouth at bedtime.     atorvastatin (LIPITOR) 80 MG tablet Take 80 mg by mouth at bedtime.     buPROPion (WELLBUTRIN XL) 300 MG 24 hr tablet Take 300 mg by mouth at bedtime.     clopidogrel (PLAVIX) 75 MG tablet Take 75 mg by mouth daily.     Glucagon (GVOKE HYPOPEN 2-PACK) 1 MG/0.2ML SOAJ Inject 1 mg into the skin as needed. For severe lows (Patient taking differently: Inject 1 mg into the skin as needed (low blood sugar). For severe lows) 0.4 mL 3   HYDROcodone-acetaminophen (NORCO/VICODIN) 5-325 MG tablet Take 1 tablet by mouth every 8 (eight) hours as needed for moderate pain.     insulin aspart (NOVOLOG) 100 UNIT/ML injection Use maximum of 65 units daily in insulin pump 20 mL 2   isosorbide dinitrate (ISORDIL) 20 MG tablet Take 20 mg by mouth 2 (two) times daily.     levothyroxine (SYNTHROID) 50 MCG tablet Take 50 mcg by mouth daily before breakfast.      loratadine (CLARITIN) 10 MG tablet Take 10 mg by mouth daily as needed for allergies.     methocarbamol (ROBAXIN-750) 750 MG tablet Take 1 tablet (750 mg total) by mouth every 6 (six) hours as needed for muscle spasms. 25 tablet 1   metoCLOPramide (REGLAN) 5 MG tablet Take 1 tablet (5 mg total) by mouth 4 (four) times daily -  before meals and at bedtime. 120 tablet 1   metoprolol tartrate (LOPRESSOR) 25 MG tablet Take 25 mg by mouth daily.     Netarsudil-Latanoprost (ROCKLATAN) 0.02-0.005 % SOLN Place 1 drop into the left eye at bedtime.     pantoprazole (PROTONIX) 40 MG tablet Take 40 mg by mouth at bedtime.     Polyethyl Glycol-Propyl Glycol (SYSTANE) 0.4-0.3 % GEL ophthalmic gel Place 1 application into both eyes 3 (three) times daily  as needed (dry/irritated eyes.).      pregabalin (LYRICA) 75 MG capsule Take 75 mg by mouth 2 (two) times daily.     ranolazine (RANEXA) 500 MG 12 hr tablet TAKE 1 TABLET BY MOUTH TWICE A DAY (Patient taking differently: Take 500 mg by mouth 2 (two) times daily.) 180  tablet 1   senna-docusate (SENOKOT-S) 8.6-50 MG tablet Take 2 tablets by mouth 2 (two) times daily. (Patient taking differently: Take 1-2 tablets by mouth 2 (two) times daily as needed for mild constipation.) 60 tablet 0   sertraline (ZOLOFT) 100 MG tablet Take 100 mg by mouth at bedtime.     sodium zirconium cyclosilicate (LOKELMA) 10 g PACK packet Take 10 g by mouth See admin instructions. MON and FRI     TRULICITY 1.5 0000000 SOPN Inject 1.5 mg into the skin every Monday.      Continuous Blood Gluc Receiver (DEXCOM G6 RECEIVER) DEVI Continuous glucose monitoring 1 each 0   Continuous Blood Gluc Sensor (DEXCOM G6 SENSOR) MISC Apply new sensor every 10 days 3 each 6   Continuous Blood Gluc Transmit (DEXCOM G6 TRANSMITTER) MISC Replace every 3 months 1 each 3   glucose blood test strip Use as instructed 100 each 12   Insulin Pen Needle (EASY COMFORT PEN NEEDLES) 31G X 8 MM MISC Check sugar 3x  daily (Patient taking differently: Check sugar 3x  daily) 100 each 2   lactulose (CHRONULAC) 10 GM/15ML solution Take 30 mLs (20 g total) by mouth 2 (two) times daily as needed for mild constipation. (Patient not taking: No sig reported) 237 each 0    Musculoskeletal: Strength & Muscle Tone: within normal limits Gait & Station:  Did not see patient ambulate.  Reports prone to frequent falls Patient leans: N/A   Psychiatric Specialty Exam:  Presentation  General Appearance: Appropriate for Environment  Eye Contact:Good (Face turned toward camera.  Patient reporting she is legally blind)  Speech:Clear and Coherent; Normal Rate  Speech Volume:Normal  Handedness:Right   Mood and Affect  Mood:Anxious; Depressed  Affect:Congruent   Thought Process  Thought Processes:Coherent; Goal Directed  Descriptions of Associations:Intact  Orientation:Full (Time, Place and Person)  Thought Content:WDL  History of Schizophrenia/Schizoaffective disorder:No data recorded Duration of Psychotic  Symptoms:Less than six months  Hallucinations:Hallucinations: Visual; Tactile  Ideas of Reference:None  Suicidal Thoughts:Suicidal Thoughts: Yes, Passive (Patient reproting if the hallucinations doesn't stop she will kill herself.  States she can't live like "this")  Homicidal Thoughts:Homicidal Thoughts: No   Sensorium  Memory:Immediate Good; Recent Good  Judgment:Fair  Insight:Fair   Executive Functions  Concentration:Good  Attention Span:Good  [place] of Knowledge:Good  Language:Good   Psychomotor Activity  Psychomotor Activity:Psychomotor Activity: Normal   Assets  Assets:Communication Skills; Desire for Improvement; Financial Resources/Insurance; Housing; Resilience; Social Support   Sleep  Sleep:Sleep: Good    Physical Exam: Physical Exam Vitals and nursing note reviewed. Exam conducted with a chaperone present.  Constitutional:      General: She is not in acute distress.    Appearance: Normal appearance. She is not ill-appearing.  Cardiovascular:     Rate and Rhythm: Normal rate.  Pulmonary:     Effort: Pulmonary effort is normal.  Musculoskeletal:     Comments: Low diastolic BP  Neurological:     Mental Status: She is alert and oriented to person, place, and time.  Psychiatric:        Attention and Perception: Attention normal. She perceives visual hallucinations.  Mood and Affect: Mood is anxious and depressed.        Speech: Speech normal.        Behavior: Behavior normal. Behavior is cooperative.        Thought Content: Thought content is not paranoid. Thought content includes suicidal ideation. Thought content does not include homicidal ideation.   Review of Systems  Constitutional: Negative.   HENT: Negative.    Eyes:        Reporting she is legally blind  Respiratory: Negative.    Cardiovascular: Negative.   Gastrointestinal: Negative.   Genitourinary: Negative.   Musculoskeletal: Negative.   Skin: Negative.    Neurological: Negative.   Endo/Heme/Allergies: Negative.   Psychiatric/Behavioral:  Positive for depression, hallucinations and suicidal ideas. Negative for substance abuse. The patient is nervous/anxious.   Blood pressure (!) 123/58, pulse 61, temperature 98.4 F (36.9 C), resp. rate 14, height '5\' 2"'$  (1.575 m), weight 86.2 kg, SpO2 94 %. Body mass index is 34.75 kg/m.  Treatment Plan Summary: Daily contact with patient to assess and evaluate symptoms and progress in treatment, Medication management, and Plan Psychiatric hospitalization  Medication Management:  aspirin EC  81 mg Oral QHS   atorvastatin  80 mg Oral QHS   buPROPion  300 mg Oral QHS   clopidogrel  75 mg Oral Daily   insulin aspart  0-5 Units Subcutaneous QHS   insulin aspart  0-9 Units Subcutaneous TID WC   insulin aspart  4 Units Subcutaneous TID WC   insulin glargine-yfgn  12 Units Subcutaneous Daily   levothyroxine  50 mcg Oral QAC breakfast   metoCLOPramide  5 mg Oral TID AC & HS   metoprolol tartrate  25 mg Oral Daily   pantoprazole  40 mg Oral QHS   pregabalin  75 mg Oral BID   QUEtiapine  50 mg Oral BID   ranolazine  500 mg Oral BID   senna-docusate  2 tablet Oral BID   sertraline  100 mg Oral QHS   Will order EKG.  Last recorded 11/28/20 wit QTc  445  Disposition: Recommend psychiatric Inpatient admission when medically cleared.  This service was provided via telemedicine using a 2-way, interactive audio and video technology.  Names of all persons participating in this telemedicine service and their role in this encounter. Name: Earleen Newport Role: NP  Name: Dr. Hampton Abbot Role: Psychiatrist  Name: Deanna Landry Role: Patient  Name:  Role:    Secure message sent to patient's nurse Gordan Payment:  Psychiatric reassessment complete.  Patient continues to need psychiatric hospitalization.   Mahamud Metts, NP 08/04/2021 12:42 PM

## 2021-08-04 NOTE — ED Notes (Signed)
Patient walked to RN station to ask for CBG; Pt is a&ox 4; patient given OJ and gram crackers; EDP notified and CBG will be rechecked for improvement-Monique,RN

## 2021-08-04 NOTE — ED Notes (Signed)
CBG 59 (improvement with first apple juice), pt still caox4 and asymptomatic - additional apple juice provided.

## 2021-08-04 NOTE — ED Notes (Signed)
[name] [phone] would like to speak to the patient

## 2021-08-04 NOTE — ED Notes (Signed)
PT observed standing in hall with hands over trash can. Pt reported she was knocking the bugs off her hands. Pt assisted back to her room.

## 2021-08-04 NOTE — ED Notes (Signed)
Reported to EDP [facility]

## 2021-08-04 NOTE — ED Notes (Signed)
Insuline orders reviewed with EDP Dykstra . One time order placed  and Diabetic referral placed.

## 2021-08-04 NOTE — Progress Notes (Signed)
Patient has been faxed out due to the recommendation for a medical/psych inpatient placement. Patient meets inpatient criteria per [facility]. Patient referred to the following facilities:  [facility]  [address], [place] [address] [phone] [place]  [address] [place] [address] [phone]  [facility]  [address] [place] [address] [phone] [place]  [address] [place] [address] [phone] [facility]  [address] [place] [address] [phone] [facility]  [place], [place] [address] [phone]  [facility]  [address] [place], [place] [address] [phone]  [facility]  [phone] [place] [address] [phone] [facility]  [address] [place] [address] [phone] [facility]  [address], [place] [address] [id number]  [facility]  [address], [place] [address] [phone] [facility]  [address], [place] [address] [id number]    CSW will continue to monitor disposition.    Mariea Clonts, MSW, LCSW-A  9:44 AM 08/04/2021

## 2021-08-04 NOTE — BH Assessment (Addendum)
Disposition:  Recommend psychiatric Inpatient admission when medically cleared, per Earleen Newport, NP.   '@2311'$ , notified the [facility] Irine Seal, RN) of patients bed needs. Patient under review at [facility]. '@2334'$ , per [facility], no appropriate beds tonight. Reassess after AM discharges.   Patient also faxed out to the following facilities for consideration of bed placement by am Disposition Social Worker.

## 2021-08-05 DIAGNOSIS — R441 Visual hallucinations: Secondary | ICD-10-CM | POA: Diagnosis not present

## 2021-08-05 DIAGNOSIS — R45851 Suicidal ideations: Secondary | ICD-10-CM | POA: Diagnosis not present

## 2021-08-05 LAB — CBG MONITORING, ED
Glucose-Capillary: 315 mg/dL — ABNORMAL HIGH (ref 70–99)
Glucose-Capillary: 80 mg/dL (ref 70–99)
Glucose-Capillary: 152 mg/dL — ABNORMAL HIGH (ref 70–99)
Glucose-Capillary: 237 mg/dL — ABNORMAL HIGH (ref 70–99)
Glucose-Capillary: 76 mg/dL (ref 70–99)

## 2021-08-05 MED ORDER — BUPROPION HCL ER (XL) 150 MG PO TB24
150.0000 mg | ORAL_TABLET | Freq: Every day | ORAL | Status: DC
  Administered 2021-08-05: 150 mg via ORAL
  Filled 2021-08-05: qty 1

## 2021-08-05 MED ORDER — OLANZAPINE 5 MG PO TBDP
5.0000 mg | ORAL_TABLET | Freq: Two times a day (BID) | ORAL | Status: DC
  Administered 2021-08-05 – 2021-08-06 (×3): 5 mg via ORAL
  Filled 2021-08-05 (×3): qty 1

## 2021-08-05 MED ORDER — LATANOPROST 0.005 % OP SOLN
1.0000 [drp] | Freq: Every day | OPHTHALMIC | Status: DC
  Filled 2021-08-05: qty 2.5

## 2021-08-05 NOTE — Progress Notes (Addendum)
Inpatient Diabetes Program Recommendations  AACE/ADA: New Consensus Statement on Inpatient Glycemic Control (2015)  Target Ranges:  Prepandial:   less than 140 mg/dL      Peak postprandial:   less than 180 mg/dL (1-2 hours)      Critically ill patients:  140 - 180 mg/dL    Results for [name], Deanna Landry (MRN [id number]) as of 08/05/2021 09:08  Ref. Range 08/04/2021 06:46 08/04/2021 07:39 08/04/2021 10:08 08/04/2021 11:33 08/04/2021 12:52 08/04/2021 14:06 08/04/2021 16:45 08/04/2021 17:14 08/04/2021 17:33 08/04/2021 21:06 08/05/2021 07:45  Glucose-Capillary Latest Ref Range: 70 - 99 mg/dL 376 (H) 486 (H)  Novolog 10 units >600 (HH)  Novolog 10 units 568 (HH)  Novolog 10 units 332 (H)  Novolog 4 units 261 (H) 49 (L) 59 (L) 97 135 (H) 315 (H)  Novolog 13 units    History: Type 1 Diabetes  Home DM Meds: She is on the T-slim pump started on 03/04/21 Basal rates: 12 AM- 2 AM = 0.60, 2 AM-8 AM = 0.65, 8 AM-12 AM = 0.50 ISF: 1: 60 except from 2-8 AM, mealtime carbohydrate ratio 1:1 Target 120 Active insulin 5 hours  Non-insulin hypoglycemic drugs: Trulicity 1.5 mg weekly     Current Orders: Lantus 12 units Daily     Novolog Sensitive Correction Scale/ SSI (0-9 units) TID AC + HS      Novolog 4 units tid meal coverage   MD- Suspect Hypoglycemia this AM may be due 34 units of Novolog in 4 hours.  Pt sensitive to insulin having type 1 diabetes + has elevated renal function and insulin doses will stack.   Please do not keep bolusing Novolog for hyperglycemia.  Note: Carbohydrates not covered over night resulting in some hyperglycemia this morning  Please consider:  -  Increase Lantus to 14 units Daily   --Will follow patient during hospitalization--  Tama Headings RN, MSN, BC-ADM Inpatient Diabetes Coordinator Team Pager [phone] (8a-5p)

## 2021-08-05 NOTE — ED Provider Notes (Signed)
Emergency Medicine Observation Re-evaluation Note  Deanna Landry is a 50 y.o. female, seen on rounds today.  Pt initially presented to the ED with presentation for SI/psychiatric disturbance and blood sugar control Currently, the patient is resting comfortably  Physical Exam  BP (!) 110/52 (BP Location: Left Arm)   Pulse 78   Temp 98.1 F (36.7 C) (Oral)   Resp 16   Ht '5\' 2"'$  (1.575 m)   Wt 86.2 kg   LMP  (LMP Unknown) Comment: hysterectomy 2014  SpO2 97%   BMI 34.75 kg/m  Physical Exam General: NAD Cardiac: Regular heart rate Lungs: No respiratory distress Psych: Stable  ED Course / MDM  EKG:   I have reviewed the labs performed to date as well as medications administered while in observation.  Recent changes in the last 24 hours include diabetes coordinator involved for BS management - long and short acting insulin ordered yesterday  Plan  Current plan is for inpatient psychiatric placement.  Patient receiving glargine 12 units in the AM, and novalog 4 units TID + sliding scale with meals.  She is a brittle diabetic - has had fluctuations in blood sugars here, but no episodes of hypoglycemia overnight, and no evidence of DKA or hyperglycemic crisis.  I do believe she is stable on this insulin regimen, and is medically cleared for inpatient psychiatric hospitalization.   Deanna Landry is not under involuntary commitment.   Wyvonnia Dusky, MD 08/05/21 [phone]

## 2021-08-05 NOTE — Progress Notes (Signed)
Patient has been recommended for med/psych inpatient and has been faxed out. Patient meets inpatient criteria per [facility]. Patient referred to the following facilities:  [facility]  [address], [place] [address] [phone] [place]  [address] [place] [address] [phone]  [facility]  [address] [place] [address] [phone] [place]  [address] [place] [address] [phone] [facility]  [address] [place] [address] [phone] [facility]  [place], [place] [address] [phone]  [facility]  [address] [place], [place] [address] [phone]  [facility]  [phone] [address] [place] [address] [phone] [facility]  [address] [place] [address] [phone] [facility]  [address] [place] [address] [phone]  [facility]  [address] [place] [address] [phone] [facility]  [place] [place] [address] [phone]  [facility]  [place], [place] [address] [phone]  [facility]  [address] [place], [place] [address] [phone] [facility]  [address] [place] [address] [id number]    CSW will continue to monitor disposition.    Mariea Clonts, MSW, LCSW-A  10:29 AM 08/05/2021

## 2021-08-05 NOTE — Consult Note (Signed)
  Patient is seen and reassessed at this time.  Patient continues to endorse visual hallucinations, and appears to be responding to internal and external stimuli.  She continues to vocalize that she is seeing bugs and insects, and is driving her stir crazy.    She continues to meet inpatient criteria, however due to medical conditions and being legally blind she is requiring a med psych..  We will continue to adjust, taper medications in order to achieve psychiatric stabilization.  Patient was previously started on quetiapine, over a week ago in which she does not seem to be responding to at this time.  Will make proper adjustments as noted below to optimize patient's care while in the emergency department.  -Will decrease Wellbutrin 150 mg p.o. daily, as this may be activating for patient and causing increasing visual hallucinations. -We will also discontinue quetiapine 50 mg p.o. twice daily, and start olanzapine 5 mg p.o. twice daily. -Psychiatry will continue to follow this patient until she is admitted inpatient, and or psychiatrically stable.

## 2021-08-06 DIAGNOSIS — D649 Anemia, unspecified: Secondary | ICD-10-CM | POA: Diagnosis not present

## 2021-08-06 DIAGNOSIS — R45851 Suicidal ideations: Secondary | ICD-10-CM | POA: Diagnosis not present

## 2021-08-06 DIAGNOSIS — E1022 Type 1 diabetes mellitus with diabetic chronic kidney disease: Secondary | ICD-10-CM | POA: Diagnosis not present

## 2021-08-06 DIAGNOSIS — E119 Type 2 diabetes mellitus without complications: Secondary | ICD-10-CM | POA: Diagnosis not present

## 2021-08-06 DIAGNOSIS — Z20822 Contact with and (suspected) exposure to covid-19: Secondary | ICD-10-CM | POA: Diagnosis not present

## 2021-08-06 DIAGNOSIS — Z7984 Long term (current) use of oral hypoglycemic drugs: Secondary | ICD-10-CM | POA: Diagnosis not present

## 2021-08-06 DIAGNOSIS — I130 Hypertensive heart and chronic kidney disease with heart failure and stage 1 through stage 4 chronic kidney disease, or unspecified chronic kidney disease: Secondary | ICD-10-CM | POA: Diagnosis not present

## 2021-08-06 DIAGNOSIS — I10 Essential (primary) hypertension: Secondary | ICD-10-CM | POA: Diagnosis not present

## 2021-08-06 DIAGNOSIS — Z955 Presence of coronary angioplasty implant and graft: Secondary | ICD-10-CM | POA: Diagnosis not present

## 2021-08-06 DIAGNOSIS — R441 Visual hallucinations: Secondary | ICD-10-CM | POA: Diagnosis not present

## 2021-08-06 DIAGNOSIS — E10319 Type 1 diabetes mellitus with unspecified diabetic retinopathy without macular edema: Secondary | ICD-10-CM | POA: Diagnosis not present

## 2021-08-06 DIAGNOSIS — F333 Major depressive disorder, recurrent, severe with psychotic symptoms: Secondary | ICD-10-CM | POA: Diagnosis not present

## 2021-08-06 DIAGNOSIS — E1065 Type 1 diabetes mellitus with hyperglycemia: Secondary | ICD-10-CM | POA: Diagnosis not present

## 2021-08-06 DIAGNOSIS — J449 Chronic obstructive pulmonary disease, unspecified: Secondary | ICD-10-CM | POA: Diagnosis not present

## 2021-08-06 DIAGNOSIS — F332 Major depressive disorder, recurrent severe without psychotic features: Secondary | ICD-10-CM | POA: Diagnosis not present

## 2021-08-06 DIAGNOSIS — N184 Chronic kidney disease, stage 4 (severe): Secondary | ICD-10-CM | POA: Diagnosis not present

## 2021-08-06 DIAGNOSIS — E039 Hypothyroidism, unspecified: Secondary | ICD-10-CM | POA: Diagnosis not present

## 2021-08-06 DIAGNOSIS — I5022 Chronic systolic (congestive) heart failure: Secondary | ICD-10-CM | POA: Diagnosis not present

## 2021-08-06 DIAGNOSIS — I509 Heart failure, unspecified: Secondary | ICD-10-CM | POA: Diagnosis not present

## 2021-08-06 DIAGNOSIS — I110 Hypertensive heart disease with heart failure: Secondary | ICD-10-CM | POA: Diagnosis not present

## 2021-08-06 DIAGNOSIS — I2510 Atherosclerotic heart disease of native coronary artery without angina pectoris: Secondary | ICD-10-CM | POA: Diagnosis not present

## 2021-08-06 DIAGNOSIS — G4730 Sleep apnea, unspecified: Secondary | ICD-10-CM | POA: Diagnosis not present

## 2021-08-06 DIAGNOSIS — I209 Angina pectoris, unspecified: Secondary | ICD-10-CM | POA: Diagnosis not present

## 2021-08-06 DIAGNOSIS — I129 Hypertensive chronic kidney disease with stage 1 through stage 4 chronic kidney disease, or unspecified chronic kidney disease: Secondary | ICD-10-CM | POA: Diagnosis not present

## 2021-08-06 LAB — CBG MONITORING, ED
Glucose-Capillary: 88 mg/dL (ref 70–99)
Glucose-Capillary: 111 mg/dL — ABNORMAL HIGH (ref 70–99)
Glucose-Capillary: 129 mg/dL — ABNORMAL HIGH (ref 70–99)
Glucose-Capillary: 226 mg/dL — ABNORMAL HIGH (ref 70–99)
Glucose-Capillary: 116 mg/dL — ABNORMAL HIGH (ref 70–99)

## 2021-08-06 NOTE — ED Notes (Signed)
Phone call made to mother. Pt cooperative at this time.

## 2021-08-06 NOTE — ED Notes (Addendum)
Multiple attempts to call [facility] for report with the two numbers provided by [place], LCSW.  Also called main phone line and spoke with operator who tried different extension. No answer still. Will continue to try.

## 2021-08-06 NOTE — ED Notes (Signed)
[name] [phone] would like to speak to the patient

## 2021-08-06 NOTE — ED Notes (Signed)
Report called to Probation officer at [facility].

## 2021-08-06 NOTE — ED Notes (Signed)
Pt taking shower.  

## 2021-08-06 NOTE — ED Notes (Signed)
Attempting to speak with safe transport representative to confirm fax was received for transport to [facility].

## 2021-08-06 NOTE — ED Provider Notes (Addendum)
Emergency Medicine Observation Re-evaluation Note   Deanna Landry is a 50 y.o. female, seen on rounds today.  Pt initially presented to the ED for complaints of Suicidal, Hypertension, and Hypoglycemia Currently, the patient is awake and alert sitting on the edge of the bed, speaking clearly.  Physical Exam  BP 134/68   Pulse 76   Temp 98.1 F (36.7 C) (Oral)   Resp 16   Ht '5\' 2"'$  (1.575 m)   Wt 86.2 kg   LMP  (LMP Unknown) Comment: hysterectomy 2014  SpO2 100%   BMI 34.75 kg/m  Physical Exam General: Awake and alert no distress Cardiac: Regular rate and rhythm Lungs: No increased work of breathing Psych: Withdrawn, but appropriately interactive when speaking  ED Course / MDM  EKG:EKG Interpretation  Date/Time:  Monday August 04 2021 14:29:37 EDT Ventricular Rate:  68 PR Interval:  176 QRS Duration: 82 QT Interval:  408 QTC Calculation: 433 R Axis:   8 Text Interpretation: Normal sinus rhythm Normal ECG Confirmed by Lacretia Leigh ([id number]) on 08/05/2021 11:34:26 AM  I have reviewed the labs performed to date as well as medications administered while in observation.  Recent changes in the last 24 hours include none.  Plan  Current plan is for behavioral health placement.  Patient has been medically cleared.  Deanna Landry is not under involuntary commitment.     Carmin Muskrat, MD 08/06/21 0820  4:25 PM Patient accepted in transfer to [facility].  Dr. Selinda Flavin is excepting.    Carmin Muskrat, MD 08/06/21 [phone]

## 2021-08-06 NOTE — Progress Notes (Signed)
Pt accepted to [facility]      Patient meets inpatient criteria per Sheran Fava, NP  Dr.Thomas Selinda Flavin is the attending provider.    Call report to [phone] or [phone]  Barbaraann Cao, RN  @ [facility] ED notified.     Pt scheduled  to arrive at [place].   Mariea Clonts, MSW, LCSW-A  10:10 AM 08/06/2021

## 2021-08-06 NOTE — ED Notes (Signed)
Placed Breakfast order 

## 2021-08-06 NOTE — ED Notes (Signed)
Paperwork for transport to [facility] faxed to safe transport due to service being greater than 75 miles.

## 2021-08-06 NOTE — Progress Notes (Signed)
Patient has been faxed out due to the recommendation for med/psych inpatient. Patient meets inpatient criteria per [facility]. Patient referred to the following facilities:  [facility]  [address] [place] [address] [phone] [place]  [address] [place] [address] [phone]  [facility]  [address], [place] [address] [phone] [place]  [address] [place] [address] [phone] [facility]  [address], [place] [address] [phone] [facility]  [place], [place] [address] [phone]  [facility]  [address] [place], [place] [address] [phone]  [facility]  [phone] [place] [address] [phone] [facility]  [place] [address] [place] [address] [phone] [facility]  [address], [place] [address] [phone]  [facility]  [address] [place] [address] [phone] [facility]  [place]., [place] [address] [phone]  [facility]  [place], [place] [address] [phone]  [facility]  [address] [place], [place] [address] [phone] [facility]  [address] [place] [address] [id number]    CSW will continue to monitor disposition.    Mariea Clonts, MSW, LCSW-A  9:36 AM 08/06/2021

## 2021-08-06 NOTE — ED Notes (Addendum)
Patient given soda and crackers.  

## 2021-08-15 DIAGNOSIS — I5042 Chronic combined systolic (congestive) and diastolic (congestive) heart failure: Secondary | ICD-10-CM | POA: Diagnosis not present

## 2021-08-15 DIAGNOSIS — J449 Chronic obstructive pulmonary disease, unspecified: Secondary | ICD-10-CM | POA: Diagnosis not present

## 2021-08-15 DIAGNOSIS — E1121 Type 2 diabetes mellitus with diabetic nephropathy: Secondary | ICD-10-CM | POA: Diagnosis not present

## 2021-08-15 DIAGNOSIS — I10 Essential (primary) hypertension: Secondary | ICD-10-CM | POA: Diagnosis not present

## 2021-08-15 DIAGNOSIS — K219 Gastro-esophageal reflux disease without esophagitis: Secondary | ICD-10-CM | POA: Diagnosis not present

## 2021-08-15 DIAGNOSIS — I509 Heart failure, unspecified: Secondary | ICD-10-CM | POA: Diagnosis not present

## 2021-08-16 DIAGNOSIS — E1065 Type 1 diabetes mellitus with hyperglycemia: Secondary | ICD-10-CM | POA: Diagnosis not present

## 2021-08-16 DIAGNOSIS — E1022 Type 1 diabetes mellitus with diabetic chronic kidney disease: Secondary | ICD-10-CM | POA: Diagnosis not present

## 2021-08-20 DIAGNOSIS — Z1331 Encounter for screening for depression: Secondary | ICD-10-CM | POA: Diagnosis not present

## 2021-08-20 DIAGNOSIS — Z0000 Encounter for general adult medical examination without abnormal findings: Secondary | ICD-10-CM | POA: Diagnosis not present

## 2021-08-20 DIAGNOSIS — H26492 Other secondary cataract, left eye: Secondary | ICD-10-CM | POA: Diagnosis not present

## 2021-08-20 DIAGNOSIS — E669 Obesity, unspecified: Secondary | ICD-10-CM | POA: Diagnosis not present

## 2021-08-20 DIAGNOSIS — Z9181 History of falling: Secondary | ICD-10-CM | POA: Diagnosis not present

## 2021-08-20 DIAGNOSIS — Z23 Encounter for immunization: Secondary | ICD-10-CM | POA: Diagnosis not present

## 2021-08-20 DIAGNOSIS — Z6835 Body mass index (BMI) 35.0-35.9, adult: Secondary | ICD-10-CM | POA: Diagnosis not present

## 2021-08-20 DIAGNOSIS — E785 Hyperlipidemia, unspecified: Secondary | ICD-10-CM | POA: Diagnosis not present

## 2021-08-23 ENCOUNTER — Other Ambulatory Visit: Payer: Self-pay | Admitting: Endocrinology

## 2021-08-23 ENCOUNTER — Other Ambulatory Visit: Payer: Self-pay | Admitting: Cardiology

## 2021-09-01 ENCOUNTER — Other Ambulatory Visit: Payer: Self-pay | Admitting: Endocrinology

## 2021-09-01 DIAGNOSIS — E782 Mixed hyperlipidemia: Secondary | ICD-10-CM

## 2021-09-01 DIAGNOSIS — E1065 Type 1 diabetes mellitus with hyperglycemia: Secondary | ICD-10-CM

## 2021-09-02 ENCOUNTER — Other Ambulatory Visit (INDEPENDENT_AMBULATORY_CARE_PROVIDER_SITE_OTHER): Payer: Medicare HMO

## 2021-09-02 ENCOUNTER — Other Ambulatory Visit: Payer: Self-pay

## 2021-09-02 DIAGNOSIS — E782 Mixed hyperlipidemia: Secondary | ICD-10-CM

## 2021-09-02 DIAGNOSIS — E1065 Type 1 diabetes mellitus with hyperglycemia: Secondary | ICD-10-CM | POA: Diagnosis not present

## 2021-09-02 LAB — BASIC METABOLIC PANEL
BUN: 40 mg/dL — ABNORMAL HIGH (ref 6–23)
CO2: 30 mEq/L (ref 19–32)
Calcium: 9.1 mg/dL (ref 8.4–10.5)
Chloride: 101 mEq/L (ref 96–112)
Creatinine, Ser: 2.42 mg/dL — ABNORMAL HIGH (ref 0.40–1.20)
GFR: 22.94 mL/min — ABNORMAL LOW (ref >60.00)
Glucose, Bld: 151 mg/dL — ABNORMAL HIGH (ref 70–99)
Potassium: 4.8 mEq/L (ref 3.5–5.1)
Sodium: 138 mEq/L (ref 135–145)

## 2021-09-02 LAB — LIPID PANEL
Cholesterol: 117 mg/dL (ref 0–200)
HDL: 46.8 mg/dL (ref >39.00)
LDL Cholesterol: 57 mg/dL (ref 0–99)
NonHDL: 69.96
Total CHOL/HDL Ratio: 2
Triglycerides: 63 mg/dL (ref 0.0–149.0)
VLDL: 12.6 mg/dL (ref 0.0–40.0)

## 2021-09-02 LAB — HEMOGLOBIN A1C: Hgb A1c MFr Bld: 7.1 % — ABNORMAL HIGH (ref 4.6–6.5)

## 2021-09-03 DIAGNOSIS — F419 Anxiety disorder, unspecified: Secondary | ICD-10-CM | POA: Diagnosis not present

## 2021-09-03 DIAGNOSIS — I509 Heart failure, unspecified: Secondary | ICD-10-CM | POA: Diagnosis not present

## 2021-09-03 DIAGNOSIS — I679 Cerebrovascular disease, unspecified: Secondary | ICD-10-CM | POA: Diagnosis not present

## 2021-09-03 DIAGNOSIS — J449 Chronic obstructive pulmonary disease, unspecified: Secondary | ICD-10-CM | POA: Diagnosis not present

## 2021-09-03 DIAGNOSIS — E039 Hypothyroidism, unspecified: Secondary | ICD-10-CM | POA: Diagnosis not present

## 2021-09-03 DIAGNOSIS — F29 Unspecified psychosis not due to a substance or known physiological condition: Secondary | ICD-10-CM | POA: Diagnosis not present

## 2021-09-03 DIAGNOSIS — M159 Polyosteoarthritis, unspecified: Secondary | ICD-10-CM | POA: Diagnosis not present

## 2021-09-03 DIAGNOSIS — N184 Chronic kidney disease, stage 4 (severe): Secondary | ICD-10-CM | POA: Diagnosis not present

## 2021-09-03 DIAGNOSIS — G4733 Obstructive sleep apnea (adult) (pediatric): Secondary | ICD-10-CM | POA: Diagnosis not present

## 2021-09-04 ENCOUNTER — Encounter: Payer: Self-pay | Admitting: Endocrinology

## 2021-09-05 ENCOUNTER — Other Ambulatory Visit: Payer: Self-pay

## 2021-09-05 ENCOUNTER — Encounter: Payer: Self-pay | Admitting: Endocrinology

## 2021-09-05 ENCOUNTER — Ambulatory Visit (INDEPENDENT_AMBULATORY_CARE_PROVIDER_SITE_OTHER): Payer: Medicare HMO | Admitting: Endocrinology

## 2021-09-05 VITALS — BP 148/82 | HR 67 | Ht 62.0 in | Wt 189.4 lb

## 2021-09-05 DIAGNOSIS — Z89421 Acquired absence of other right toe(s): Secondary | ICD-10-CM | POA: Diagnosis not present

## 2021-09-05 DIAGNOSIS — E1042 Type 1 diabetes mellitus with diabetic polyneuropathy: Secondary | ICD-10-CM

## 2021-09-05 DIAGNOSIS — E1065 Type 1 diabetes mellitus with hyperglycemia: Secondary | ICD-10-CM | POA: Diagnosis not present

## 2021-09-05 MED ORDER — INSULIN ASPART 100 UNIT/ML IJ SOLN: INTRAMUSCULAR | 2 refills | Status: DC

## 2021-09-05 NOTE — Progress Notes (Signed)
**Note De-Identified Deanna Obfuscation** Patient ID: Deanna Landry, female   DOB: [birth year], 50 y.o.   MRN: [id number]           Reason for Appointment : Follow-up for Type 1 Diabetes  History of Present Illness   Referring HCP:Lee, Keung         Diagnosis: Type 1 diabetes mellitus, date of diagnosis: Age 32         Previous history:   She has been on insulin since her diagnosis when she had significant symptomatic hypoglycemia. Has been on various types of insulin regimens in the past but usually not followed by endocrinologist The last 5 or 6 years she has been on Levemir and NovoLog Previous level of control is variable with A1c only inconsistently below 7 BASAL insulin doses previously: 14 units Tresiba daily  Recent history:   She is on the T-slim pump started on 03/04/21   Basal rates: 12 AM- 2 AM = 0.60, 2 AM-8 AM = 0.65, 8 AM-12 AM = 0.50 ISF: 1: 60 except from 2-8 AM, mealtime carbohydrate ratio 1:1 Target 120 Active insulin 5 hours  Non-insulin hypoglycemic drugs: Trulicity 1.5 mg weekly  A1c is stable at 6.8, was 6.9  Current management, blood sugar patterns and problems identified:   She is trying to estimate her carbohydrates but appears to be taking somewhat empirically calculated boluses  Also periodically appears to be bolusing late after starting to eat and her sugar has gone up  As before she is afraid of low sugars and may not bolus if blood sugars are near normal  Also appears to be taking additional boluses based on entering n extra carbohydrates instead of the glucose level for high sugars; is resolved her correction boluses are only 2% of overall boluses totaling 10/day This is occasionally causing hypoglycemia including at night She is frequently eating fried food for dinner and last night blood sugar was high for a few hours from the high fat content  Hypoglycemia does occasionally occur if she is not eating as much or overestimating how much she needs 3 units per 15 g  For some reason she has  a sleep mode set between 3 AM and 12 noon Generally highest readings are late in the evenings overall but has significant variability   Interpretation of her Dexcom data is as follows for the last 2 weeks  Summary of patterns: Blood sugars are above the target range on an average between 7 PM-1 AM otherwise averaging roughly between 130 and 170 during the day  OVERNIGHT blood sugars are highly variable and frequently high in the early part and occasionally low around 3-4 AM but not recently  generally no consistent pattern is seen with her blood sugars because of variability which is most significant after 6 PM POSTPRANDIAL readings are relatively lower after lunch bolus recently but frequently higher after evening bolus and may stay high for a few hours Hypoglycemia as above occasionally occurring overnight or late afternoon and evening, usually preceded by food bolus      CGM use % of time   2-week average/SD 166/61  Time in range     66   %  % Time Above 180 3 1  % Time above 250   % Time Below 70 3      PRE-MEAL  overnight  mornings  afternoon  evening Overall  Glucose range:       Averages: 160 153 161 190     Previously:  CGM use %  of time 98  2-week average/SD 162/67  Time in range      65% was 74  % Time Above 180 31  % Time above 250   % Time Below 70 4      PRE-MEAL  overnight  mornings  afternoon  evening Overall  Glucose range:       Averages:/SD 173/59 185/78 150 144     Hypoglycemia:  occurs mostly overnight Factors causing hyperglycemia: Excessive Levemir at night Symptoms of hypoglycemia:none or minimal Treatment of hypoglycemia: Juices, if patient is unconscious mother will call the ambulance         Self-care: The diet that the patient has been following is: None, does not know carbohydrate counting Immediately skipping breakfast, lunch will be assignments, dinner usually consists of bread or pasta Snacks will be peanut butter crackers, yogurt,  cottage cheese, fruit Eating out only about once or twice a month   Mealtimes are: Breakfast none Lunch: 1 pm Dinner: 6pm         Exercise: None          Dietician consultation none.          Wt Readings from Last 3 Encounters:  09/05/21 189 lb 6.4 oz (85.9 kg)  08/01/21 190 lb (86.2 kg)  06/05/21 182 lb 12.8 oz (82.9 kg)    Diabetes labs:  Lab Results  Component Value Date   HGBA1C 7.1 (H) 09/02/2021   HGBA1C 6.8 (A) 06/05/2021   HGBA1C 6.9 (A) 03/05/2021   Lab Results  Component Value Date   LDLCALC 57 09/02/2021   CREATININE 2.42 (H) 09/02/2021    No results found for: MICRALBCREAT   Allergies as of 09/05/2021   No Known Allergies      Medication List        Accurate as of September 05, 2021  2:57 PM. If you have any questions, ask your nurse or doctor.          STOP taking these medications    lactulose 10 GM/15ML solution Commonly known as: [place] by: Elayne Snare, MD       TAKE these medications    acetaminophen 325 MG tablet Commonly known as: TYLENOL Take 650 mg by mouth every 6 (six) hours as needed for mild pain.   albuterol 108 (90 Base) MCG/ACT inhaler Commonly known as: VENTOLIN HFA Inhale 1-2 puffs into the lungs every 6 (six) hours as needed for wheezing or shortness of breath.   ALPRAZolam 0.25 MG tablet Commonly known as: XANAX Take 0.25 mg by mouth every 6 (six) hours as needed for anxiety or sleep.   aspirin EC 81 MG tablet Take 81 mg by mouth at bedtime.   atorvastatin 80 MG tablet Commonly known as: LIPITOR Take 80 mg by mouth at bedtime.   buPROPion 300 MG 24 hr tablet Commonly known as: WELLBUTRIN XL Take 300 mg by mouth at bedtime.   clopidogrel 75 MG tablet Commonly known as: PLAVIX Take 75 mg by mouth daily.   Dexcom G6 Receiver Devi Continuous glucose monitoring   Dexcom G6 Sensor Misc Apply new sensor every 10 days   Dexcom G6 Transmitter Misc Replace every 3 months   Easy Comfort Pen  Needles 31G X 8 MM Misc Generic drug: Insulin Pen Needle Check sugar 3x  daily   glucose blood test strip Use as instructed   Gvoke HypoPen 2-Pack 1 MG/0.2ML [place] Generic drug: Glucagon Inject 1 mg into the skin as needed. For severe lows What  changed: reasons to take this   haloperidol 5 MG tablet Commonly known as: HALDOL Take 5 mg by mouth 2 (two) times daily.   HYDROcodone-acetaminophen 5-325 MG tablet Commonly known as: NORCO/VICODIN Take 1 tablet by mouth every 8 (eight) hours as needed for moderate pain.   insulin aspart 100 UNIT/ML injection Commonly known as: novoLOG Use maximum of 65 units daily in insulin pump   isosorbide dinitrate 20 MG tablet Commonly known as: ISORDIL Take 20 mg by mouth 2 (two) times daily.   levothyroxine 50 MCG tablet Commonly known as: SYNTHROID Take 50 mcg by mouth daily before breakfast.   Lokelma 10 g Pack packet Generic drug: sodium zirconium cyclosilicate Take 10 g by mouth See admin instructions. MON and FRI   loratadine 10 MG tablet Commonly known as: CLARITIN Take 10 mg by mouth daily as needed for allergies.   methocarbamol 750 MG tablet Commonly known as: Robaxin-750 Take 1 tablet (750 mg total) by mouth every 6 (six) hours as needed for muscle spasms.   metoCLOPramide 5 MG tablet Commonly known as: REGLAN Take 1 tablet (5 mg total) by mouth 4 (four) times daily -  before meals and at bedtime.   metoprolol tartrate 25 MG tablet Commonly known as: LOPRESSOR Take 25 mg by mouth daily.   pantoprazole 40 MG tablet Commonly known as: PROTONIX Take 40 mg by mouth at bedtime.   pregabalin 75 MG capsule Commonly known as: LYRICA Take 75 mg by mouth 2 (two) times daily.   ranolazine 500 MG 12 hr tablet Commonly known as: RANEXA TAKE 1 TABLET BY MOUTH TWICE A DAY   Rock[place]an 0.02-0.005 % Soln Generic drug: Netarsudil-Latanoprost Place 1 drop into the left eye at bedtime.   senna-docusate 8.6-50 MG tablet Commonly  known as: Senokot-S Take 2 tablets by mouth 2 (two) times daily.   sertraline 100 MG tablet Commonly known as: ZOLOFT Take 100 mg by mouth at bedtime.   Systane 0.4-0.3 % Gel ophthalmic gel Generic drug: Polyethyl Glycol-Propyl Glycol Place 1 application into both eyes 3 (three) times daily as needed (dry/irritated eyes.).   Trulicity 1.5 TD/3.2KG Sopn Generic drug: Dulaglutide Inject 1.5 mg into the skin every Monday.        Allergies: No Known Allergies  Past Medical History:  Diagnosis Date   Acute kidney injury superimposed on CKD ([place]) 03/15/2019   Acute respiratory failure with hypoxia ([place]) 03/15/2019   Adjustment disorder with mixed anxiety and depressed mood 03/11/2019   Anemia    Anxiety    Arthritis    right ankle   Blind    right eye   CHF (congestive heart failure) ([place])    Closed right pilon fracture, initial encounter 07/26/2019   COPD (chronic obstructive pulmonary disease) ([facility])    Coronary artery disease    Depression    Diabetes mellitus type 2 in obese ([place]) 07/26/2019   Diabetes mellitus without complication ([place])    type 1   Diabetic polyneuropathy ([place]) 05/04/2016   Dyslipidemia 01/03/2020   Fever 03/10/2019   GERD (gastroesophageal reflux disease)    History of blood transfusion    HLD (hyperlipidemia)    Hyperglycemia    Hypertension    Hypothyroidism    Ileus, postoperative ([place])    Major depression, recurrent ([place]) 03/09/2019   MDD (major depressive disorder), severe ([place]) 03/09/2019   Myocardial infarct, old    2020, s/p DES RCA, RPDA 10/23/19   Neck pain 04/21/2016   Pneumonia 10/2019   Renal  disorder    stage 3   Sepsis ([place]) 03/10/2019   Short-term memory loss    mild   Sleep apnea    study done 9/27 and 08/13/20 - no results yet 08/21/20   Stroke ([place])    Suicidal ideation 03/15/2019   Type 1 diabetes mellitus without complication ([place]) 2/37/6283   Wound infection after surgery (Right ankle) 10/04/2019    Past Surgical History:   Procedure Laterality Date   ABDOMINAL HYSTERECTOMY  2014   ANKLE FUSION Right 08/23/2020   Procedure: ANKLE FUSION;  Surgeon: Shona Needles, MD;  Location: [place];  Service: Orthopedics;  Laterality: Right;   APPLICATION OF WOUND VAC Right 10/11/2019   Procedure: WOUND VAC CHANGE TO RIGHT LEG;  Surgeon: Shona Needles, MD;  Location: [place];  Service: Orthopedics;  Laterality: Right;   CORONARY ANGIOPLASTY  10/23/2019   DES RCA, DES RPDA 10/23/19 [facility])   EYE SURGERY     HARDWARE REMOVAL Right 10/06/2019   Procedure: HARDWARE REMOVAL;  Surgeon: Shona Needles, MD;  Location: [place];  Service: Orthopedics;  Laterality: Right;   HARDWARE REMOVAL Right 08/23/2020   Procedure: HARDWARE REMOVAL;  Surgeon: Shona Needles, MD;  Location: [place];  Service: Orthopedics;  Laterality: Right;   I & D EXTREMITY Right 10/06/2019   Procedure: IRRIGATION AND DEBRIDEMENT EXTREMITY;  Surgeon: Shona Needles, MD;  Location: [place];  Service: Orthopedics;  Laterality: Right;   I & D EXTREMITY Right 10/09/2019   Procedure: IRRIGATION AND DEBRIDEMENT EXTREMITY and WOUND VAC CHANGE RIGHT ANKLE;  Surgeon: Shona Needles, MD;  Location: [place];  Service: Orthopedics;  Laterality: Right;   ORIF ANKLE FRACTURE Right 07/26/2019   Procedure: OPEN REDUCTION INTERNAL FIXATION (ORIF) ANKLE FRACTURE;  Surgeon: Shona Needles, MD;  Location: [place];  Service: Orthopedics;  Laterality: Right;  OPEN REDUCTION INTERNAL FIXATION (ORIF) ANKLE FRACTURE    TOE AMPUTATION      Family History  Problem Relation Age of Onset   Diabetes Mellitus II Mother    Stroke Mother    Heart attack Mother    Stomach cancer Father    Congestive Heart Failure Father    Diabetes Sister    Congestive Heart Failure Maternal Grandfather     Social History:  reports that she has never smoked. She has never used smokeless tobacco. She reports that she does not currently use alcohol. She reports that she does not currently use drugs.      Review  of Systems    HYPOTHYROIDISM: Treated by PCP with levothyroxine 50 mcg and last TSH 4.7 done in 8/21 Last TSH from PCP was in 9/22 and was normal      Lipids: Last LDL was 57 checked by PCP  She is on a statin drug, currently on 80 mg Lipitor  Lab Results  Component Value Date   CHOL 117 09/02/2021   HDL 46.80 09/02/2021   LDLCALC 57 09/02/2021   TRIG 63.0 09/02/2021   CHOLHDL 2 09/02/2021    Last dilated eye exam was in 3/22  Peripheral neuropathy: She will get at times pains and paresthesia in her legs treated with Lyrica and tramadol.  Also has numbness in her hands  DIABETES COMPLICATIONS: Neuropathy, retinopathy nephropathy   CKD: Labs as follows, regularly seen by nephrologist and appears to have relatively higher creatinine now  Lab Results  Component Value Date   CREATININE 2.42 (H) 09/02/2021   CREATININE 2.14 (H) 07/29/2021   CREATININE  2.13 (H) 12/04/2020   Blood pressure being monitored by nephrologist  BP Readings from Last 3 Encounters:  09/05/21 (!) 148/82  08/06/21 136/61  06/05/21 (!) 142/78       Physical Examination:  BP (!) 148/82   Pulse 67   Ht 5\' 2"  (1.575 m)   Wt 189 lb 6.4 oz (85.9 kg)   LMP  (LMP Unknown) Comment: hysterectomy 2014  SpO2 98%   BMI 34.64 kg/m   Diabetic Foot Exam - Simple   Simple Foot Form Diabetic Foot exam was performed with the following findings: Yes   Visual Inspection See comments: Yes Sensation Testing See comments: Yes Pulse Check See comments: Yes Comments Amputation of second right toe present. Decreased sensation on the first and second toes on the right Pedal pulses not palpable on the right No edema      ASSESSMENT:  Diabetes type 1, on basal bolus insulin  A1c is 7.1  See history of present illness for detailed discussion of current diabetes management, blood sugar patterns and problems identified  Her blood sugars are still reasonably controlled although as discussed above she  still has continued problems with her management and lack of complete understanding of bolus functions She is not aware that she is having the sleep more turned at 3 AM even though she is going to sleep at 8 or 9 PM in the evening This is also preventing auto correction  Now hypoglycemia is more frequently related to over bolusing and discussed how to prevent this  Neuropathy: Foot exam is adequate  PLAN:   Recommendations Basal rate will be continued unchanged for now She will stop doing her correction boluses by entering carbohydrates and only bolus based on the blood sugar This was demonstrated to her on the pump Sleep mode was turned off from the programmed settings currently She will try to bolus more consistently at the start of the meal or at the most diuresis as she finishes She will likely need at least 2 units more when eating high fat foods such as fried chicken nuggets If she still has high readings at 2+ hours after eating she will then take a correction bolus based on glucose level Reduce bolus at lunchtime for sandwiches to 3 units only  Call if having unusually high or low sugars To use the exercise modality  when being more active with housework or walking    Patient Instructions  3 units for sandwiches  Take 7 units for fried food  If needing to correct high sugars do not put in carbs  Avoid sleep mode   Total visit time including counseling: 30 minutes  Elayne Snare 09/05/2021, 2:57 PM      Note: This note was prepared with Dragon voice recognition system technology. Any transcriptional errors that result from this process are unintentional.

## 2021-09-05 NOTE — Patient Instructions (Signed)
3 units for sandwiches  Take 7 units for fried food  If needing to correct high sugars do not put in carbs  Avoid sleep mode

## 2021-09-10 DIAGNOSIS — I509 Heart failure, unspecified: Secondary | ICD-10-CM | POA: Diagnosis not present

## 2021-09-10 DIAGNOSIS — K219 Gastro-esophageal reflux disease without esophagitis: Secondary | ICD-10-CM | POA: Diagnosis not present

## 2021-09-10 DIAGNOSIS — E1121 Type 2 diabetes mellitus with diabetic nephropathy: Secondary | ICD-10-CM | POA: Diagnosis not present

## 2021-09-10 DIAGNOSIS — J449 Chronic obstructive pulmonary disease, unspecified: Secondary | ICD-10-CM | POA: Diagnosis not present

## 2021-09-10 DIAGNOSIS — I10 Essential (primary) hypertension: Secondary | ICD-10-CM | POA: Diagnosis not present

## 2021-09-14 DIAGNOSIS — I5042 Chronic combined systolic (congestive) and diastolic (congestive) heart failure: Secondary | ICD-10-CM | POA: Diagnosis not present

## 2021-09-16 DIAGNOSIS — E1065 Type 1 diabetes mellitus with hyperglycemia: Secondary | ICD-10-CM | POA: Diagnosis not present

## 2021-09-16 DIAGNOSIS — E1022 Type 1 diabetes mellitus with diabetic chronic kidney disease: Secondary | ICD-10-CM | POA: Diagnosis not present

## 2021-09-17 DIAGNOSIS — N184 Chronic kidney disease, stage 4 (severe): Secondary | ICD-10-CM | POA: Diagnosis not present

## 2021-09-17 DIAGNOSIS — D631 Anemia in chronic kidney disease: Secondary | ICD-10-CM | POA: Diagnosis not present

## 2021-09-17 DIAGNOSIS — E785 Hyperlipidemia, unspecified: Secondary | ICD-10-CM | POA: Diagnosis not present

## 2021-09-17 DIAGNOSIS — N2581 Secondary hyperparathyroidism of renal origin: Secondary | ICD-10-CM | POA: Diagnosis not present

## 2021-09-17 DIAGNOSIS — I129 Hypertensive chronic kidney disease with stage 1 through stage 4 chronic kidney disease, or unspecified chronic kidney disease: Secondary | ICD-10-CM | POA: Diagnosis not present

## 2021-09-17 DIAGNOSIS — N281 Cyst of kidney, acquired: Secondary | ICD-10-CM | POA: Diagnosis not present

## 2021-09-17 DIAGNOSIS — E109 Type 1 diabetes mellitus without complications: Secondary | ICD-10-CM | POA: Diagnosis not present

## 2021-09-18 DIAGNOSIS — F419 Anxiety disorder, unspecified: Secondary | ICD-10-CM | POA: Diagnosis not present

## 2021-09-18 DIAGNOSIS — Z794 Long term (current) use of insulin: Secondary | ICD-10-CM | POA: Diagnosis not present

## 2021-09-18 DIAGNOSIS — E1121 Type 2 diabetes mellitus with diabetic nephropathy: Secondary | ICD-10-CM | POA: Diagnosis not present

## 2021-09-18 DIAGNOSIS — E039 Hypothyroidism, unspecified: Secondary | ICD-10-CM | POA: Diagnosis not present

## 2021-09-18 DIAGNOSIS — F29 Unspecified psychosis not due to a substance or known physiological condition: Secondary | ICD-10-CM | POA: Diagnosis not present

## 2021-09-18 DIAGNOSIS — I509 Heart failure, unspecified: Secondary | ICD-10-CM | POA: Diagnosis not present

## 2021-09-18 DIAGNOSIS — M159 Polyosteoarthritis, unspecified: Secondary | ICD-10-CM | POA: Diagnosis not present

## 2021-09-18 DIAGNOSIS — N184 Chronic kidney disease, stage 4 (severe): Secondary | ICD-10-CM | POA: Diagnosis not present

## 2021-09-18 DIAGNOSIS — G4733 Obstructive sleep apnea (adult) (pediatric): Secondary | ICD-10-CM | POA: Diagnosis not present

## 2021-09-22 DIAGNOSIS — E1022 Type 1 diabetes mellitus with diabetic chronic kidney disease: Secondary | ICD-10-CM | POA: Diagnosis not present

## 2021-10-02 DIAGNOSIS — E1121 Type 2 diabetes mellitus with diabetic nephropathy: Secondary | ICD-10-CM | POA: Diagnosis not present

## 2021-10-02 DIAGNOSIS — E785 Hyperlipidemia, unspecified: Secondary | ICD-10-CM | POA: Diagnosis not present

## 2021-10-02 DIAGNOSIS — I509 Heart failure, unspecified: Secondary | ICD-10-CM | POA: Diagnosis not present

## 2021-10-02 DIAGNOSIS — F419 Anxiety disorder, unspecified: Secondary | ICD-10-CM | POA: Diagnosis not present

## 2021-10-02 DIAGNOSIS — Z794 Long term (current) use of insulin: Secondary | ICD-10-CM | POA: Diagnosis not present

## 2021-10-02 DIAGNOSIS — F29 Unspecified psychosis not due to a substance or known physiological condition: Secondary | ICD-10-CM | POA: Diagnosis not present

## 2021-10-02 DIAGNOSIS — N184 Chronic kidney disease, stage 4 (severe): Secondary | ICD-10-CM | POA: Diagnosis not present

## 2021-10-02 DIAGNOSIS — M159 Polyosteoarthritis, unspecified: Secondary | ICD-10-CM | POA: Diagnosis not present

## 2021-10-02 DIAGNOSIS — I10 Essential (primary) hypertension: Secondary | ICD-10-CM | POA: Diagnosis not present

## 2021-10-16 DIAGNOSIS — N184 Chronic kidney disease, stage 4 (severe): Secondary | ICD-10-CM | POA: Diagnosis not present

## 2021-10-16 DIAGNOSIS — J449 Chronic obstructive pulmonary disease, unspecified: Secondary | ICD-10-CM | POA: Diagnosis not present

## 2021-10-16 DIAGNOSIS — G4733 Obstructive sleep apnea (adult) (pediatric): Secondary | ICD-10-CM | POA: Diagnosis not present

## 2021-10-16 DIAGNOSIS — I679 Cerebrovascular disease, unspecified: Secondary | ICD-10-CM | POA: Diagnosis not present

## 2021-10-16 DIAGNOSIS — F419 Anxiety disorder, unspecified: Secondary | ICD-10-CM | POA: Diagnosis not present

## 2021-10-16 DIAGNOSIS — I509 Heart failure, unspecified: Secondary | ICD-10-CM | POA: Diagnosis not present

## 2021-10-16 DIAGNOSIS — R110 Nausea: Secondary | ICD-10-CM | POA: Diagnosis not present

## 2021-10-16 DIAGNOSIS — F29 Unspecified psychosis not due to a substance or known physiological condition: Secondary | ICD-10-CM | POA: Diagnosis not present

## 2021-10-16 DIAGNOSIS — M159 Polyosteoarthritis, unspecified: Secondary | ICD-10-CM | POA: Diagnosis not present

## 2021-10-30 DIAGNOSIS — M159 Polyosteoarthritis, unspecified: Secondary | ICD-10-CM | POA: Diagnosis not present

## 2021-10-30 DIAGNOSIS — I509 Heart failure, unspecified: Secondary | ICD-10-CM | POA: Diagnosis not present

## 2021-10-30 DIAGNOSIS — F29 Unspecified psychosis not due to a substance or known physiological condition: Secondary | ICD-10-CM | POA: Diagnosis not present

## 2021-10-30 DIAGNOSIS — R110 Nausea: Secondary | ICD-10-CM | POA: Diagnosis not present

## 2021-10-30 DIAGNOSIS — N184 Chronic kidney disease, stage 4 (severe): Secondary | ICD-10-CM | POA: Diagnosis not present

## 2021-10-30 DIAGNOSIS — J449 Chronic obstructive pulmonary disease, unspecified: Secondary | ICD-10-CM | POA: Diagnosis not present

## 2021-10-30 DIAGNOSIS — F419 Anxiety disorder, unspecified: Secondary | ICD-10-CM | POA: Diagnosis not present

## 2021-10-30 DIAGNOSIS — G4733 Obstructive sleep apnea (adult) (pediatric): Secondary | ICD-10-CM | POA: Diagnosis not present

## 2021-10-30 DIAGNOSIS — I679 Cerebrovascular disease, unspecified: Secondary | ICD-10-CM | POA: Diagnosis not present

## 2021-11-14 DIAGNOSIS — I10 Essential (primary) hypertension: Secondary | ICD-10-CM | POA: Diagnosis not present

## 2021-11-14 DIAGNOSIS — J449 Chronic obstructive pulmonary disease, unspecified: Secondary | ICD-10-CM | POA: Diagnosis not present

## 2021-11-14 DIAGNOSIS — E1121 Type 2 diabetes mellitus with diabetic nephropathy: Secondary | ICD-10-CM | POA: Diagnosis not present

## 2021-11-14 DIAGNOSIS — K219 Gastro-esophageal reflux disease without esophagitis: Secondary | ICD-10-CM | POA: Diagnosis not present

## 2021-11-21 DIAGNOSIS — I679 Cerebrovascular disease, unspecified: Secondary | ICD-10-CM | POA: Diagnosis not present

## 2021-11-21 DIAGNOSIS — F419 Anxiety disorder, unspecified: Secondary | ICD-10-CM | POA: Diagnosis not present

## 2021-11-21 DIAGNOSIS — I509 Heart failure, unspecified: Secondary | ICD-10-CM | POA: Diagnosis not present

## 2021-11-21 DIAGNOSIS — F29 Unspecified psychosis not due to a substance or known physiological condition: Secondary | ICD-10-CM | POA: Diagnosis not present

## 2021-11-21 DIAGNOSIS — M159 Polyosteoarthritis, unspecified: Secondary | ICD-10-CM | POA: Diagnosis not present

## 2021-11-21 DIAGNOSIS — G4733 Obstructive sleep apnea (adult) (pediatric): Secondary | ICD-10-CM | POA: Diagnosis not present

## 2021-11-21 DIAGNOSIS — N184 Chronic kidney disease, stage 4 (severe): Secondary | ICD-10-CM | POA: Diagnosis not present

## 2021-11-21 DIAGNOSIS — F5104 Psychophysiologic insomnia: Secondary | ICD-10-CM | POA: Diagnosis not present

## 2021-11-21 DIAGNOSIS — J449 Chronic obstructive pulmonary disease, unspecified: Secondary | ICD-10-CM | POA: Diagnosis not present

## 2021-11-26 DIAGNOSIS — N2581 Secondary hyperparathyroidism of renal origin: Secondary | ICD-10-CM | POA: Diagnosis not present

## 2021-11-26 DIAGNOSIS — N184 Chronic kidney disease, stage 4 (severe): Secondary | ICD-10-CM | POA: Diagnosis not present

## 2021-11-26 DIAGNOSIS — E875 Hyperkalemia: Secondary | ICD-10-CM | POA: Diagnosis not present

## 2021-11-26 DIAGNOSIS — I129 Hypertensive chronic kidney disease with stage 1 through stage 4 chronic kidney disease, or unspecified chronic kidney disease: Secondary | ICD-10-CM | POA: Diagnosis not present

## 2021-11-26 DIAGNOSIS — R809 Proteinuria, unspecified: Secondary | ICD-10-CM | POA: Diagnosis not present

## 2021-11-26 DIAGNOSIS — E785 Hyperlipidemia, unspecified: Secondary | ICD-10-CM | POA: Diagnosis not present

## 2021-11-26 DIAGNOSIS — D631 Anemia in chronic kidney disease: Secondary | ICD-10-CM | POA: Diagnosis not present

## 2021-12-05 DIAGNOSIS — N184 Chronic kidney disease, stage 4 (severe): Secondary | ICD-10-CM | POA: Diagnosis not present

## 2021-12-05 DIAGNOSIS — F5104 Psychophysiologic insomnia: Secondary | ICD-10-CM | POA: Diagnosis not present

## 2021-12-05 DIAGNOSIS — I679 Cerebrovascular disease, unspecified: Secondary | ICD-10-CM | POA: Diagnosis not present

## 2021-12-05 DIAGNOSIS — I509 Heart failure, unspecified: Secondary | ICD-10-CM | POA: Diagnosis not present

## 2021-12-05 DIAGNOSIS — F419 Anxiety disorder, unspecified: Secondary | ICD-10-CM | POA: Diagnosis not present

## 2021-12-05 DIAGNOSIS — F29 Unspecified psychosis not due to a substance or known physiological condition: Secondary | ICD-10-CM | POA: Diagnosis not present

## 2021-12-05 DIAGNOSIS — G4733 Obstructive sleep apnea (adult) (pediatric): Secondary | ICD-10-CM | POA: Diagnosis not present

## 2021-12-05 DIAGNOSIS — M159 Polyosteoarthritis, unspecified: Secondary | ICD-10-CM | POA: Diagnosis not present

## 2021-12-05 DIAGNOSIS — J449 Chronic obstructive pulmonary disease, unspecified: Secondary | ICD-10-CM | POA: Diagnosis not present

## 2021-12-09 ENCOUNTER — Other Ambulatory Visit (INDEPENDENT_AMBULATORY_CARE_PROVIDER_SITE_OTHER): Payer: Medicare HMO

## 2021-12-09 ENCOUNTER — Other Ambulatory Visit: Payer: Self-pay | Admitting: Endocrinology

## 2021-12-09 ENCOUNTER — Other Ambulatory Visit: Payer: Self-pay

## 2021-12-09 DIAGNOSIS — E1065 Type 1 diabetes mellitus with hyperglycemia: Secondary | ICD-10-CM | POA: Diagnosis not present

## 2021-12-09 LAB — BASIC METABOLIC PANEL
BUN: 22 mg/dL (ref 6–23)
CO2: 35 mEq/L — ABNORMAL HIGH (ref 19–32)
Calcium: 9.5 mg/dL (ref 8.4–10.5)
Chloride: 100 mEq/L (ref 96–112)
Creatinine, Ser: 2.05 mg/dL — ABNORMAL HIGH (ref 0.40–1.20)
GFR: 27.95 mL/min — ABNORMAL LOW (ref >60.00)
Glucose, Bld: 47 mg/dL — CL (ref 70–99)
Potassium: 4.1 mEq/L (ref 3.5–5.1)
Sodium: 141 mEq/L (ref 135–145)

## 2021-12-09 LAB — HEMOGLOBIN A1C: Hgb A1c MFr Bld: 6.9 % — ABNORMAL HIGH (ref 4.6–6.5)

## 2021-12-12 ENCOUNTER — Other Ambulatory Visit: Payer: Self-pay | Admitting: Endocrinology

## 2021-12-12 ENCOUNTER — Encounter: Payer: Medicare HMO | Admitting: Endocrinology

## 2021-12-12 ENCOUNTER — Encounter: Payer: Self-pay | Admitting: Endocrinology

## 2021-12-12 ENCOUNTER — Other Ambulatory Visit: Payer: Self-pay

## 2021-12-12 NOTE — Progress Notes (Signed)
This encounter was created in error - please disregard.

## 2021-12-19 DIAGNOSIS — I679 Cerebrovascular disease, unspecified: Secondary | ICD-10-CM | POA: Diagnosis not present

## 2021-12-19 DIAGNOSIS — M159 Polyosteoarthritis, unspecified: Secondary | ICD-10-CM | POA: Diagnosis not present

## 2021-12-19 DIAGNOSIS — I509 Heart failure, unspecified: Secondary | ICD-10-CM | POA: Diagnosis not present

## 2021-12-19 DIAGNOSIS — F29 Unspecified psychosis not due to a substance or known physiological condition: Secondary | ICD-10-CM | POA: Diagnosis not present

## 2021-12-19 DIAGNOSIS — J449 Chronic obstructive pulmonary disease, unspecified: Secondary | ICD-10-CM | POA: Diagnosis not present

## 2021-12-19 DIAGNOSIS — N184 Chronic kidney disease, stage 4 (severe): Secondary | ICD-10-CM | POA: Diagnosis not present

## 2021-12-19 DIAGNOSIS — F419 Anxiety disorder, unspecified: Secondary | ICD-10-CM | POA: Diagnosis not present

## 2021-12-19 DIAGNOSIS — F5104 Psychophysiologic insomnia: Secondary | ICD-10-CM | POA: Diagnosis not present

## 2021-12-19 DIAGNOSIS — G4733 Obstructive sleep apnea (adult) (pediatric): Secondary | ICD-10-CM | POA: Diagnosis not present

## 2021-12-29 ENCOUNTER — Other Ambulatory Visit: Payer: Self-pay

## 2021-12-29 ENCOUNTER — Ambulatory Visit (INDEPENDENT_AMBULATORY_CARE_PROVIDER_SITE_OTHER): Payer: Medicare HMO | Admitting: Endocrinology

## 2021-12-29 ENCOUNTER — Encounter: Payer: Self-pay | Admitting: Endocrinology

## 2021-12-29 VITALS — BP 142/90 | HR 74 | Ht 62.0 in | Wt 174.0 lb

## 2021-12-29 DIAGNOSIS — E1065 Type 1 diabetes mellitus with hyperglycemia: Secondary | ICD-10-CM | POA: Diagnosis not present

## 2021-12-29 MED ORDER — NOVOLOG FLEXPEN 100 UNIT/ML ~~LOC~~ SOPN: PEN_INJECTOR | SUBCUTANEOUS | 1 refills | Status: DC

## 2021-12-29 MED ORDER — INSULIN DEGLUDEC 100 UNIT/ML ~~LOC~~ SOPN: 14.0000 [IU] | PEN_INJECTOR | Freq: Every day | SUBCUTANEOUS | 2 refills | Status: DC

## 2021-12-29 MED ORDER — ACCU-CHEK GUIDE VI STRP: ORAL_STRIP | 2 refills | Status: DC

## 2021-12-29 NOTE — Progress Notes (Signed)
Patient ID: Deanna Landry, female   DOB: 1971/08/08, 51 y.o.   MRN: [id number]           Reason for Appointment : Follow-up for Type 1 Diabetes  History of Present Illness   Referring HCP:Lee, Keung         Diagnosis: Type 1 diabetes mellitus, date of diagnosis: Age 45         Previous history:   She has been on insulin since her diagnosis when she had significant symptomatic hypoglycemia. Has been on various types of insulin regimens in the past but usually not followed by endocrinologist The last 5 or 6 years she has been on Levemir and NovoLog Previous level of control is variable with A1c only inconsistently below 7  She was on the T-slim pump started on 03/04/21 Settings: Basal rates: 12 AM- 2 AM = 0.60, 2 AM-8 AM = 0.65, 8 AM-12 AM = 0.50 ISF: 1: 60 except from 2-8 AM, mealtime carbohydrate ratio 1:1 Target 120 Active insulin 5 hours   Recent history:   BASAL insulin doses previously: 14 units Tresiba daily MEALTIME insulin: Variable doses Non-insulin hypoglycemic drugs: Trulicity 1.5 mg weekly  A1c is last 6.9  Current management, blood sugar patterns and problems identified:   She stopped using the pump because she could not see the numbers on the display and she was concerned she cannot use it when she was alone  Also cannot use the Dexcom since she cannot see the numbers on the display  She was supposed to switch to the freestyle libre but she still has not been able to get the supplies  Although she is using the Accu-Chek meter she is mostly checking the blood sugars fasting and when she is symptomatic with low sugars Only occasionally will check sugars at bedtime  Currently trying to estimate her mealtime dose based on carbohydrates but she does not know what they show she is using Also not checking blood sugars before eating to calculate any correction Recently however some of her readings at bedtime are either normal or slightly low Although she went back to 16  units of Tresiba even though she was taking 14 or less previously before switching to the pump This is causing hypoglycemia frequently at night with blood sugars as low as 36 She is sometimes eating fried food Usually eating sandwiches at lunchtime but she thinks she takes up to 6 units for this meal   Dinner 5 pm       PRE-MEAL Fasting Lunch Dinner Overnight Overall  Glucose range: 36-100  92    Mean/median:     110   POST-MEAL PC Breakfast PC Lunch PC Dinner  Glucose range: 150  69-160  Mean/median:      Previous CGM data:  CGM use % of time   2-week average/SD 166/61  Time in range     66   %  % Time Above 180 3 1  % Time above 250   % Time Below 70 3      PRE-MEAL  overnight  mornings  afternoon  evening Overall  Glucose range:       Averages: 160 153 161 190      Hypoglycemia:  occurs mostly overnight Factors causing hyperglycemia: Excessive Levemir at night Symptoms of hypoglycemia:none or minimal Treatment of hypoglycemia: Juices, if patient is unconscious mother will call the ambulance         Self-care: The diet that the patient has been following  is: None, does not know carbohydrate counting Immediately skipping breakfast, lunch will be assignments, dinner usually consists of bread or pasta Snacks will be peanut butter crackers, yogurt, cottage cheese, fruit Eating out only about once or twice a month   Mealtimes are: Breakfast none Lunch: 1 pm Dinner: 6pm         Exercise: None          Dietician consultation none.          Wt Readings from Last 3 Encounters:  12/29/21 174 lb (78.9 kg)  09/05/21 189 lb 6.4 oz (85.9 kg)  08/01/21 190 lb (86.2 kg)    Diabetes labs:  Lab Results  Component Value Date   HGBA1C 6.9 (H) 12/09/2021   HGBA1C 7.1 (H) 09/02/2021   HGBA1C 6.8 (A) 06/05/2021   Lab Results  Component Value Date   LDLCALC 57 09/02/2021   CREATININE 2.05 (H) 12/09/2021    No results found for: MICRALBCREAT   Allergies as of  12/29/2021   No Known Allergies      Medication List        Accurate as of December 29, 2021  3:38 PM. If you have any questions, ask your nurse or doctor.          STOP taking these medications    insulin aspart 100 UNIT/ML injection Commonly known as: novoLOG Replaced by: NovoLOG FlexPen 100 UNIT/ML FlexPen Stopped by: Elayne Snare, MD       TAKE these medications    Accu-Chek Guide test strip Generic drug: glucose blood Use to check blood sugar 5 times daily. Dx E 10.65 What changed: additional instructions Changed by: Elayne Snare, MD   acetaminophen 325 MG tablet Commonly known as: TYLENOL Take 650 mg by mouth every 6 (six) hours as needed for mild pain.   albuterol 108 (90 Base) MCG/ACT inhaler Commonly known as: VENTOLIN HFA Inhale 1-2 puffs into the lungs every 6 (six) hours as needed for wheezing or shortness of breath.   ALPRAZolam 0.25 MG tablet Commonly known as: XANAX Take 0.25 mg by mouth every 6 (six) hours as needed for anxiety or sleep.   aspirin EC 81 MG tablet Take 81 mg by mouth at bedtime.   atorvastatin 80 MG tablet Commonly known as: LIPITOR Take 80 mg by mouth at bedtime.   buPROPion 300 MG 24 hr tablet Commonly known as: WELLBUTRIN XL Take 300 mg by mouth at bedtime.   clopidogrel 75 MG tablet Commonly known as: PLAVIX Take 75 mg by mouth daily.   Dexcom G6 Receiver Devi Continuous glucose monitoring   Dexcom G6 Sensor Misc Apply new sensor every 10 days   Dexcom G6 Transmitter Misc Replace every 3 months   Easy Comfort Pen Needles 31G X 8 MM Misc Generic drug: Insulin Pen Needle Check sugar 3x  daily   Gvoke HypoPen 2-Pack 1 MG/0.2ML Soaj Generic drug: Glucagon Inject 1 mg into the skin as needed. For severe lows What changed: reasons to take this   haloperidol 5 MG tablet Commonly known as: HALDOL Take 5 mg by mouth 2 (two) times daily.   HYDROcodone-acetaminophen 5-325 MG tablet Commonly known as:  NORCO/VICODIN Take 1 tablet by mouth every 8 (eight) hours as needed for moderate pain.   insulin degludec 100 UNIT/ML FlexTouch Pen Commonly known as: TRESIBA Inject 14 Units into the skin daily. Adjust as directed What changed:  how much to take additional instructions Changed by: Elayne Snare, MD   isosorbide dinitrate 20 MG  tablet Commonly known as: ISORDIL Take 20 mg by mouth 2 (two) times daily.   levothyroxine 50 MCG tablet Commonly known as: SYNTHROID Take 50 mcg by mouth daily before breakfast.   Lokelma 10 g Pack packet Generic drug: sodium zirconium cyclosilicate Take 10 g by mouth See admin instructions. MON and FRI   loratadine 10 MG tablet Commonly known as: CLARITIN Take 10 mg by mouth daily as needed for allergies.   methocarbamol 750 MG tablet Commonly known as: Robaxin-750 Take 1 tablet (750 mg total) by mouth every 6 (six) hours as needed for muscle spasms.   metoCLOPramide 5 MG tablet Commonly known as: REGLAN Take 1 tablet (5 mg total) by mouth 4 (four) times daily -  before meals and at bedtime.   metoprolol tartrate 25 MG tablet Commonly known as: LOPRESSOR Take 25 mg by mouth daily.   NovoLOG FlexPen 100 UNIT/ML FlexPen Generic drug: insulin aspart 3-8 units ac TID Replaces: insulin aspart 100 UNIT/ML injection Started by: Elayne Snare, MD   pantoprazole 40 MG tablet Commonly known as: PROTONIX Take 40 mg by mouth at bedtime.   pregabalin 75 MG capsule Commonly known as: LYRICA Take 75 mg by mouth 2 (two) times daily.   ranolazine 500 MG 12 hr tablet Commonly known as: RANEXA TAKE 1 TABLET BY MOUTH TWICE A DAY   Rocklatan 0.02-0.005 % Soln Generic drug: Netarsudil-Latanoprost Place 1 drop into the left eye at bedtime.   senna-docusate 8.6-50 MG tablet Commonly known as: Senokot-S Take 2 tablets by mouth 2 (two) times daily.   sertraline 100 MG tablet Commonly known as: ZOLOFT Take 100 mg by mouth at bedtime.   Systane 0.4-0.3 %  Gel ophthalmic gel Generic drug: Polyethyl Glycol-Propyl Glycol Place 1 application into both eyes 3 (three) times daily as needed (dry/irritated eyes.).   Trulicity 1.5 AS/3.4HD Sopn Generic drug: Dulaglutide Inject 1.5 mg into the skin every Monday.        Allergies: No Known Allergies  Past Medical History:  Diagnosis Date   Acute kidney injury superimposed on CKD ([place]) 03/15/2019   Acute respiratory failure with hypoxia ([place]) 03/15/2019   Adjustment disorder with mixed anxiety and depressed mood 03/11/2019   Anemia    Anxiety    Arthritis    right ankle   Blind    right eye   CHF (congestive heart failure) ([place])    Closed right pilon fracture, initial encounter 07/26/2019   COPD (chronic obstructive pulmonary disease) ([place])    Coronary artery disease    Depression    Diabetes mellitus type 2 in obese ([place]) 07/26/2019   Diabetes mellitus without complication ([place])    type 1   Diabetic polyneuropathy ([place]) 05/04/2016   Dyslipidemia 01/03/2020   Fever 03/10/2019   GERD (gastroesophageal reflux disease)    History of blood transfusion    HLD (hyperlipidemia)    Hyperglycemia    Hypertension    Hypothyroidism    Ileus, postoperative ([place])    Major depression, recurrent ([place]) 03/09/2019   MDD (major depressive disorder), severe ([place]) 03/09/2019   Myocardial infarct, old    2020, s/p DES RCA, RPDA 10/23/19   Neck pain 04/21/2016   Pneumonia 10/2019   Renal disorder    stage 3   Sepsis ([place]) 03/10/2019   Short-term memory loss    mild   Sleep apnea    study done 9/27 and 08/13/20 - no results yet 08/21/20   Stroke ([place])    Suicidal ideation 03/15/2019  Type 1 diabetes mellitus without complication ([place]) 01/29/4007   Wound infection after surgery (Right ankle) 10/04/2019    Past Surgical History:  Procedure Laterality Date   ABDOMINAL HYSTERECTOMY  2014   ANKLE FUSION Right 08/23/2020   Procedure: ANKLE FUSION;  Surgeon: Shona Needles, MD;  Location: [facility];  Service:  Orthopedics;  Laterality: Right;   APPLICATION OF WOUND VAC Right 10/11/2019   Procedure: WOUND VAC CHANGE TO RIGHT LEG;  Surgeon: Shona Needles, MD;  Location: [facility];  Service: Orthopedics;  Laterality: Right;   CORONARY ANGIOPLASTY  10/23/2019   DES RCA, DES RPDA 10/23/19 [facility])   EYE SURGERY     HARDWARE REMOVAL Right 10/06/2019   Procedure: HARDWARE REMOVAL;  Surgeon: Shona Needles, MD;  Location: [place];  Service: Orthopedics;  Laterality: Right;   HARDWARE REMOVAL Right 08/23/2020   Procedure: HARDWARE REMOVAL;  Surgeon: Shona Needles, MD;  Location: [place];  Service: Orthopedics;  Laterality: Right;   I & D EXTREMITY Right 10/06/2019   Procedure: IRRIGATION AND DEBRIDEMENT EXTREMITY;  Surgeon: Shona Needles, MD;  Location: [place];  Service: Orthopedics;  Laterality: Right;   I & D EXTREMITY Right 10/09/2019   Procedure: IRRIGATION AND DEBRIDEMENT EXTREMITY and WOUND VAC CHANGE RIGHT ANKLE;  Surgeon: Shona Needles, MD;  Location: [place];  Service: Orthopedics;  Laterality: Right;   ORIF ANKLE FRACTURE Right 07/26/2019   Procedure: OPEN REDUCTION INTERNAL FIXATION (ORIF) ANKLE FRACTURE;  Surgeon: Shona Needles, MD;  Location: [place];  Service: Orthopedics;  Laterality: Right;  OPEN REDUCTION INTERNAL FIXATION (ORIF) ANKLE FRACTURE    TOE AMPUTATION      Family History  Problem Relation Age of Onset   Diabetes Mellitus II Mother    Stroke Mother    Heart attack Mother    Stomach cancer Father    Congestive Heart Failure Father    Diabetes Sister    Congestive Heart Failure Maternal Grandfather     Social History:  reports that she has never smoked. She has never used smokeless tobacco. She reports that she does not currently use alcohol. She reports that she does not currently use drugs.      Review of Systems    HYPOTHYROIDISM: Treated by PCP with levothyroxine 50 mcg and last TSH 4.7 done in 8/21 Last TSH from PCP was in 9/22 and was normal      Lipids:   She is on  a statin drug, currently on 80 mg Lipitor  Lab Results  Component Value Date   CHOL 117 09/02/2021   HDL 46.80 09/02/2021   LDLCALC 57 09/02/2021   TRIG 63.0 09/02/2021   CHOLHDL 2 09/02/2021    Last dilated eye exam was in 3/22  Peripheral neuropathy: She will get at times pains and paresthesia in her legs treated with Lyrica and tramadol.   Also has numbness in her hands  DIABETES COMPLICATIONS: Neuropathy, retinopathy nephropathy   CKD: Labs as follows, regularly seen by nephrologist   Lab Results  Component Value Date   CREATININE 2.05 (H) 12/09/2021   CREATININE 2.42 (H) 09/02/2021   CREATININE 2.14 (H) 07/29/2021   Blood pressure being monitored by nephrologist  BP Readings from Last 3 Encounters:  12/29/21 (!) 142/90  09/05/21 (!) 148/82  08/06/21 136/61       Physical Examination:  BP (!) 142/90    Pulse 74    Ht 5\' 2"  (1.575 m)    Wt 174 lb (78.9  kg)    LMP  (LMP Unknown) Comment: hysterectomy 2014   SpO2 97%    BMI 31.83 kg/m     ASSESSMENT:  Diabetes type 1, on basal bolus insulin  A1c is now 6.9  See history of present illness for detailed discussion of current diabetes management, blood sugar patterns and problems identified  She is not able to continue insulin pump because of visual difficulties  Her blood sugars are difficult to assess because of lack of CGM and mostly overnight monitoring with low blood sugars before or after meals during the day usually  She went on Tresiba on her own but with 16 units is getting excessive hypoglycemia overnight Also unclear how much insulin she is taking for her meal coverage appears to be taking more than previously taken on the pump Also not doing any correction readings or any high readings    PLAN:   Discussed how to use basal insulin with adjustment based on fasting or overnight readings She will drop down to 12 units and 1600 [place] Given her detailed instructions on how to adjust this every 3  days by 1 unit to keep blood sugars between 100 and about 150 If she is getting low sugars she may need to reduce 2 units  Discussed that she should try and look up the carbohydrates with her foods and not use more than 1:10 ratio See history of present illness for detailed discussion of current diabetes management, blood sugar patterns and problems identified To divide her carbohydrates by 10 Also given her correction dose of 1 unit for every 50 points over 150  Will fax prescription for the freestyle libre to the [facility] since she needs to use this to help guide her insulin doses and to be alerted for hypoglycemia She will need to start checking blood sugars at least 3-4 times a day at mealtimes and after meals as much as possible  Patient Instructions  High Sugar extras: If sugar is 150-200: 1 unit and 1 more per 50 points higher  If < 100 reduce at least 1 unit  Divide carbs by 10 for meal coverage  Tresiba 12 units and every 3-4 days go up every 3-4 days by 1 unit if staying > 150  Under 100 in am : reduce Tresiba 1-2 units  Total visit time including counseling = 30 minutes  Elayne Snare 12/29/2021, 3:38 PM      Note: This note was prepared with Dragon voice recognition system technology. Any transcriptional errors that result from this process are unintentional.

## 2021-12-29 NOTE — Patient Instructions (Signed)
High Sugar extras: If sugar is 150-200: 1 unit and 1 more per 50 points higher  If < 100 reduce at least 1 unit  Divide carbs by 10 for meal coverage  Tresiba 12 units and every 3-4 days go up every 3-4 days by 1 unit if staying > 150  Under 100 in am : reduce Tresiba 1-2 units

## 2021-12-31 ENCOUNTER — Telehealth: Payer: Self-pay

## 2021-12-31 NOTE — Telephone Encounter (Signed)
Patient left vm stating she needs refills on her tresiba and novolog pens. Rx refills were sent in to pharmacy on yesterday. Tried calling patient back but no answer.

## 2022-01-02 DIAGNOSIS — M159 Polyosteoarthritis, unspecified: Secondary | ICD-10-CM | POA: Diagnosis not present

## 2022-01-02 DIAGNOSIS — E1121 Type 2 diabetes mellitus with diabetic nephropathy: Secondary | ICD-10-CM | POA: Diagnosis not present

## 2022-01-02 DIAGNOSIS — F419 Anxiety disorder, unspecified: Secondary | ICD-10-CM | POA: Diagnosis not present

## 2022-01-02 DIAGNOSIS — I679 Cerebrovascular disease, unspecified: Secondary | ICD-10-CM | POA: Diagnosis not present

## 2022-01-02 DIAGNOSIS — J449 Chronic obstructive pulmonary disease, unspecified: Secondary | ICD-10-CM | POA: Diagnosis not present

## 2022-01-02 DIAGNOSIS — E785 Hyperlipidemia, unspecified: Secondary | ICD-10-CM | POA: Diagnosis not present

## 2022-01-02 DIAGNOSIS — F5104 Psychophysiologic insomnia: Secondary | ICD-10-CM | POA: Diagnosis not present

## 2022-01-02 DIAGNOSIS — I509 Heart failure, unspecified: Secondary | ICD-10-CM | POA: Diagnosis not present

## 2022-01-02 DIAGNOSIS — N184 Chronic kidney disease, stage 4 (severe): Secondary | ICD-10-CM | POA: Diagnosis not present

## 2022-01-02 DIAGNOSIS — G4733 Obstructive sleep apnea (adult) (pediatric): Secondary | ICD-10-CM | POA: Diagnosis not present

## 2022-01-02 DIAGNOSIS — F29 Unspecified psychosis not due to a substance or known physiological condition: Secondary | ICD-10-CM | POA: Diagnosis not present

## 2022-01-02 DIAGNOSIS — I10 Essential (primary) hypertension: Secondary | ICD-10-CM | POA: Diagnosis not present

## 2022-01-16 DIAGNOSIS — F29 Unspecified psychosis not due to a substance or known physiological condition: Secondary | ICD-10-CM | POA: Diagnosis not present

## 2022-01-16 DIAGNOSIS — M659 Synovitis and tenosynovitis, unspecified: Secondary | ICD-10-CM | POA: Diagnosis not present

## 2022-01-16 DIAGNOSIS — E1121 Type 2 diabetes mellitus with diabetic nephropathy: Secondary | ICD-10-CM | POA: Diagnosis not present

## 2022-01-16 DIAGNOSIS — I509 Heart failure, unspecified: Secondary | ICD-10-CM | POA: Diagnosis not present

## 2022-01-16 DIAGNOSIS — Z794 Long term (current) use of insulin: Secondary | ICD-10-CM | POA: Diagnosis not present

## 2022-01-16 DIAGNOSIS — F419 Anxiety disorder, unspecified: Secondary | ICD-10-CM | POA: Diagnosis not present

## 2022-01-16 DIAGNOSIS — G4733 Obstructive sleep apnea (adult) (pediatric): Secondary | ICD-10-CM | POA: Diagnosis not present

## 2022-01-16 DIAGNOSIS — N184 Chronic kidney disease, stage 4 (severe): Secondary | ICD-10-CM | POA: Diagnosis not present

## 2022-01-16 DIAGNOSIS — F5104 Psychophysiologic insomnia: Secondary | ICD-10-CM | POA: Diagnosis not present

## 2022-01-30 DIAGNOSIS — G4733 Obstructive sleep apnea (adult) (pediatric): Secondary | ICD-10-CM | POA: Diagnosis not present

## 2022-01-30 DIAGNOSIS — I509 Heart failure, unspecified: Secondary | ICD-10-CM | POA: Diagnosis not present

## 2022-01-30 DIAGNOSIS — F29 Unspecified psychosis not due to a substance or known physiological condition: Secondary | ICD-10-CM | POA: Diagnosis not present

## 2022-01-30 DIAGNOSIS — E1121 Type 2 diabetes mellitus with diabetic nephropathy: Secondary | ICD-10-CM | POA: Diagnosis not present

## 2022-01-30 DIAGNOSIS — Z794 Long term (current) use of insulin: Secondary | ICD-10-CM | POA: Diagnosis not present

## 2022-01-30 DIAGNOSIS — F5104 Psychophysiologic insomnia: Secondary | ICD-10-CM | POA: Diagnosis not present

## 2022-01-30 DIAGNOSIS — F419 Anxiety disorder, unspecified: Secondary | ICD-10-CM | POA: Diagnosis not present

## 2022-01-30 DIAGNOSIS — N184 Chronic kidney disease, stage 4 (severe): Secondary | ICD-10-CM | POA: Diagnosis not present

## 2022-01-30 DIAGNOSIS — M159 Polyosteoarthritis, unspecified: Secondary | ICD-10-CM | POA: Diagnosis not present

## 2022-02-05 ENCOUNTER — Other Ambulatory Visit: Payer: Self-pay | Admitting: Endocrinology

## 2022-02-05 ENCOUNTER — Other Ambulatory Visit (INDEPENDENT_AMBULATORY_CARE_PROVIDER_SITE_OTHER): Payer: Medicare HMO

## 2022-02-05 ENCOUNTER — Other Ambulatory Visit: Payer: Self-pay

## 2022-02-05 DIAGNOSIS — E1065 Type 1 diabetes mellitus with hyperglycemia: Secondary | ICD-10-CM

## 2022-02-05 LAB — URINALYSIS, ROUTINE W REFLEX MICROSCOPIC
Bilirubin Urine: NEGATIVE
Hgb urine dipstick: NEGATIVE
Ketones, ur: NEGATIVE
Nitrite: NEGATIVE
RBC / HPF: NONE SEEN (ref 0–?)
Specific Gravity, Urine: 1.02 (ref 1.000–1.030)
Total Protein, Urine: NEGATIVE
Urine Glucose: NEGATIVE
Urobilinogen, UA: 0.2 (ref 0.0–1.0)
pH: 6 (ref 5.0–8.0)

## 2022-02-05 LAB — GLUCOSE, RANDOM: Glucose, Bld: 87 mg/dL (ref 70–99)

## 2022-02-05 LAB — MICROALBUMIN / CREATININE URINE RATIO
Creatinine,U: 137.5 mg/dL
Microalb Creat Ratio: 4.3 mg/g (ref 0.0–30.0)
Microalb, Ur: 5.9 mg/dL — ABNORMAL HIGH (ref 0.0–1.9)

## 2022-02-06 LAB — FRUCTOSAMINE: Fructosamine: 299 umol/L — ABNORMAL HIGH (ref 0–285)

## 2022-02-12 ENCOUNTER — Ambulatory Visit (INDEPENDENT_AMBULATORY_CARE_PROVIDER_SITE_OTHER): Payer: Medicare HMO | Admitting: Endocrinology

## 2022-02-12 VITALS — BP 130/76 | HR 81 | Ht 62.0 in | Wt 172.4 lb

## 2022-02-12 DIAGNOSIS — E1065 Type 1 diabetes mellitus with hyperglycemia: Secondary | ICD-10-CM | POA: Diagnosis not present

## 2022-02-12 MED ORDER — NOVOLOG FLEXPEN 100 UNIT/ML ~~LOC~~ SOPN: PEN_INJECTOR | SUBCUTANEOUS | 3 refills | Status: DC

## 2022-02-12 MED ORDER — INSULIN DEGLUDEC 100 UNIT/ML ~~LOC~~ SOPN: 14.0000 [IU] | PEN_INJECTOR | Freq: Every day | SUBCUTANEOUS | 2 refills | Status: DC

## 2022-02-12 NOTE — Progress Notes (Signed)
Patient ID: Deanna Landry, female   DOB: [birth year], 51 y.o.   MRN: [id number] ? ?       ? ? ?Reason for Appointment : Follow-up for Type 1 Diabetes ? ?History of Present Illness  ? ?Referring HCP:Lee, Keung ?        ?Diagnosis: Type 1 diabetes mellitus, date of diagnosis: Age 76       ? ? ?Previous history:  ? ?She has been on insulin since her diagnosis when she had significant symptomatic hypoglycemia. ?Has been on various types of insulin regimens in the past but usually not followed by endocrinologist ?The last 5 or 6 years she has been on Levemir and NovoLog ?Previous level of control is variable with A1c only inconsistently below 7 ? ?She was on the T-slim pump started on 03/04/21 ?Settings: Basal rates: 12 AM- 2 AM = 0.60, 2 AM-8 AM = 0.65, 8 AM-12 AM = 0.50 ?ISF: 1: 60 except from 2-8 AM, mealtime carbohydrate ratio 1:1 Target 120 ?Active insulin 5 hours ? ? ?Recent history:  ? ?BASAL insulin doses previously: 12 units [place] daily ?MEALTIME insulin: 10-15 ? ?Non-insulin hypoglycemic drugs: Trulicity 1.5 mg weekly ? ?A1c is last 6.9 ? ?Current management, blood sugar patterns and problems identified:   ?She is again not able to get the supplies for her libre sensor because of difficulties with communicating with the supply company  ?Again not using the pump because she could not see the numbers on the display clearly  ?Also cannot use the Dexcom since she cannot see the numbers on the display  ?She appears to be taking somewhat arbitrary doses of NovoLog at mealtimes, mostly 10 to 15 units, usually 8 to 10 units for a sandwich and not actually counting carbohydrates ?Her meter was programmed about 12 hours behind actual time and difficult to analyze her readings ?Even though she thinks her fasting readings are over 200 at times they do not appear to be checked recently and not clear if they have been higher ?Most of her blood sugars appear to be fairly close to normal with only 1 low normal reading of 67 ?Her  TRESIBA was reduced to 12 units on the last visit and this has reduced hypoglycemia ?However unclear whether she is adjusting her [name] arbitrarily based on her blood sugar at the time of injection  ?Also and she thinks she is trying to avoid fried food and generally eat healthier ?However not losing weight ? ? ? ?Dinner 5 pm ? ?   ?PRE-MEAL Fasting Lunch Dinner Bedtime Overall  ?Glucose range: 117  97    ?Mean/median:     162  ? ?POST-MEAL PC Breakfast PC Lunch PC Dinner  ?Glucose range: 196 140, 289 67-154  ?Mean/median:     ? ?Previously: ?  ?PRE-MEAL Fasting Lunch Dinner Overnight Overall  ?Glucose range: 36-100  92    ?Mean/median:     110  ? ?POST-MEAL PC Breakfast PC Lunch PC Dinner  ?Glucose range: 150  69-160  ?Mean/median:     ? ?Previous CGM data: ? ?CGM use % of time   ?2-week average/SD 166/61  ?Time in range     66   %  ?% Time Above 180 3 1  ?% Time above 250   ?% Time Below 70 3  ? ?  ? ?PRE-MEAL  overnight  mornings  afternoon  evening Overall  ?Glucose range:       ?Averages: 160 153 161 190   ? ? ? ?  Hypoglycemia:  occurs mostly overnight ?Factors causing hyperglycemia: Excessive Levemir at night ?Symptoms of hypoglycemia:none or minimal ?Treatment of hypoglycemia: Juices, if patient is unconscious mother will call the ambulance        ? ?Self-care: The diet that the patient has been following is: None, does not know carbohydrate counting ?Immediately skipping breakfast, lunch will be assignments, dinner usually consists of bread or pasta ?Snacks will be peanut butter crackers, yogurt, cottage cheese, fruit ?Eating out only about once or twice a month ? ? Mealtimes are: Breakfast none Lunch: 1 pm Dinner: 6pm ?        ?Exercise: None          ?Dietician consultation none.         ? ?Wt Readings from Last 3 Encounters:  ?02/12/22 172 lb 6.4 oz (78.2 kg)  ?12/29/21 174 lb (78.9 kg)  ?09/05/21 189 lb 6.4 oz (85.9 kg)  ? ? ?Diabetes labs: ? ?Lab Results  ?Component Value Date  ? HGBA1C 6.9 (H)  12/09/2021  ? HGBA1C 7.1 (H) 09/02/2021  ? HGBA1C 6.8 (A) 06/05/2021  ? ?Lab Results  ?Component Value Date  ? MICROALBUR 5.9 (H) 02/05/2022  ? [place] 57 09/02/2021  ? CREATININE 2.05 (H) 12/09/2021  ? ? ?Lab Results  ?Component Value Date  ? MICRALBCREAT 4.3 02/05/2022  ? ? ? ?Allergies as of 02/12/2022   ?No Known Allergies ?  ? ?  ?Medication List  ?  ? ?  ? Accurate as of February 12, 2022  3:28 PM. If you have any questions, ask your nurse or doctor.  ?  ?  ? ?  ? ?Accu-Chek Guide test strip ?Generic drug: glucose blood ?Use to check blood sugar 5 times daily. Dx E 10.65 ?  ?acetaminophen 325 MG tablet ?Commonly known as: TYLENOL ?Take 650 mg by mouth every 6 (six) hours as needed for mild pain. ?  ?albuterol 108 (90 Base) MCG/ACT inhaler ?Commonly known as: VENTOLIN HFA ?Inhale 1-2 puffs into the lungs every 6 (six) hours as needed for wheezing or shortness of breath. ?  ?ALPRAZolam 0.25 MG tablet ?Commonly known as: Duanne Moron ?Take 0.25 mg by mouth every 6 (six) hours as needed for anxiety or sleep. ?  ?aspirin EC 81 MG tablet ?Take 81 mg by mouth at bedtime. ?  ?atorvastatin 80 MG tablet ?Commonly known as: LIPITOR ?Take 80 mg by mouth at bedtime. ?  ?buPROPion 300 MG 24 hr tablet ?Commonly known as: WELLBUTRIN XL ?Take 300 mg by mouth at bedtime. ?  ?clopidogrel 75 MG tablet ?Commonly known as: PLAVIX ?Take 75 mg by mouth daily. ?  ?Dexcom G6 Receiver Kerrin Mo ?Continuous glucose monitoring ?  ?Dexcom G6 Sensor Misc ?Apply new sensor every 10 days ?  ?Dexcom G6 Transmitter Misc ?Replace every 3 months ?  ?Easy Comfort Pen Needles 31G X 8 MM Misc ?Generic drug: Insulin Pen Needle ?Check sugar 3x  daily ?  ?Gvoke HypoPen 2-Pack 1 MG/0.2ML Soaj ?Generic drug: Glucagon ?Inject 1 mg into the skin as needed. For severe lows ?What changed: reasons to take this ?  ?haloperidol 5 MG tablet ?Commonly known as: HALDOL ?Take 5 mg by mouth 2 (two) times daily. ?  ?HYDROcodone-acetaminophen 5-325 MG tablet ?Commonly known as:  NORCO/VICODIN ?Take 1 tablet by mouth every 8 (eight) hours as needed for moderate pain. ?  ?insulin degludec 100 UNIT/ML FlexTouch Pen ?Commonly known as: TRESIBA ?Inject 14 Units into the skin daily. Adjust as directed ?  ?isosorbide dinitrate 20 MG  tablet ?Commonly known as: ISORDIL ?Take 20 mg by mouth 2 (two) times daily. ?  ?levothyroxine 50 MCG tablet ?Commonly known as: SYNTHROID ?Take 50 mcg by mouth daily before breakfast. ?  ?Lokelma 10 g Pack packet ?Generic drug: sodium zirconium cyclosilicate ?Take 10 g by mouth See admin instructions. MON and FRI ?  ?loratadine 10 MG tablet ?Commonly known as: CLARITIN ?Take 10 mg by mouth daily as needed for allergies. ?  ?methocarbamol 750 MG tablet ?Commonly known as: Robaxin-750 ?Take 1 tablet (750 mg total) by mouth every 6 (six) hours as needed for muscle spasms. ?  ?metoCLOPramide 5 MG tablet ?Commonly known as: REGLAN ?Take 1 tablet (5 mg total) by mouth 4 (four) times daily -  before meals and at bedtime. ?  ?metoprolol tartrate 25 MG tablet ?Commonly known as: LOPRESSOR ?Take 25 mg by mouth daily. ?  ?NovoLOG FlexPen 100 UNIT/ML FlexPen ?Generic drug: insulin aspart ?3-8 units ac TID ?  ?pantoprazole 40 MG tablet ?Commonly known as: PROTONIX ?Take 40 mg by mouth at bedtime. ?  ?pregabalin 75 MG capsule ?Commonly known as: LYRICA ?Take 75 mg by mouth 2 (two) times daily. ?  ?ranolazine 500 MG 12 hr tablet ?Commonly known as: RANEXA ?TAKE 1 TABLET BY MOUTH TWICE A DAY ?  ?Rocklatan 0.02-0.005 % Soln ?Generic drug: Netarsudil-Latanoprost ?Place 1 drop into the left eye at bedtime. ?  ?senna-docusate 8.6-50 MG tablet ?Commonly known as: Senokot-S ?Take 2 tablets by mouth 2 (two) times daily. ?  ?sertraline 100 MG tablet ?Commonly known as: ZOLOFT ?Take 100 mg by mouth at bedtime. ?  ?Systane 0.4-0.3 % Gel ophthalmic gel ?Generic drug: Polyethyl Glycol-Propyl Glycol ?Place 1 application. into both eyes 3 (three) times daily as needed (dry/irritated eyes.). ?   ?Trulicity 1.5 FU/9.3AT Sopn ?Generic drug: Dulaglutide ?Inject 1.5 mg into the skin every Monday. ?  ? ?  ? ? ?Allergies: No Known Allergies ? ?Past Medical History:  ?Diagnosis Date  ? Acute kidney injury superimpo

## 2022-02-12 NOTE — Patient Instructions (Addendum)
Stay on 12 Tresiba and go up to 14 if over 180 in am for 3 days ?

## 2022-02-13 DIAGNOSIS — M159 Polyosteoarthritis, unspecified: Secondary | ICD-10-CM | POA: Diagnosis not present

## 2022-02-13 DIAGNOSIS — Z794 Long term (current) use of insulin: Secondary | ICD-10-CM | POA: Diagnosis not present

## 2022-02-13 DIAGNOSIS — F29 Unspecified psychosis not due to a substance or known physiological condition: Secondary | ICD-10-CM | POA: Diagnosis not present

## 2022-02-13 DIAGNOSIS — F5104 Psychophysiologic insomnia: Secondary | ICD-10-CM | POA: Diagnosis not present

## 2022-02-13 DIAGNOSIS — G4733 Obstructive sleep apnea (adult) (pediatric): Secondary | ICD-10-CM | POA: Diagnosis not present

## 2022-02-13 DIAGNOSIS — F419 Anxiety disorder, unspecified: Secondary | ICD-10-CM | POA: Diagnosis not present

## 2022-02-13 DIAGNOSIS — N184 Chronic kidney disease, stage 4 (severe): Secondary | ICD-10-CM | POA: Diagnosis not present

## 2022-02-13 DIAGNOSIS — E1121 Type 2 diabetes mellitus with diabetic nephropathy: Secondary | ICD-10-CM | POA: Diagnosis not present

## 2022-02-13 DIAGNOSIS — I509 Heart failure, unspecified: Secondary | ICD-10-CM | POA: Diagnosis not present

## 2022-02-27 DIAGNOSIS — F5104 Psychophysiologic insomnia: Secondary | ICD-10-CM | POA: Diagnosis not present

## 2022-02-27 DIAGNOSIS — E1121 Type 2 diabetes mellitus with diabetic nephropathy: Secondary | ICD-10-CM | POA: Diagnosis not present

## 2022-02-27 DIAGNOSIS — I10 Essential (primary) hypertension: Secondary | ICD-10-CM | POA: Diagnosis not present

## 2022-02-27 DIAGNOSIS — M159 Polyosteoarthritis, unspecified: Secondary | ICD-10-CM | POA: Diagnosis not present

## 2022-02-27 DIAGNOSIS — F3341 Major depressive disorder, recurrent, in partial remission: Secondary | ICD-10-CM | POA: Diagnosis not present

## 2022-02-27 DIAGNOSIS — Z794 Long term (current) use of insulin: Secondary | ICD-10-CM | POA: Diagnosis not present

## 2022-02-27 DIAGNOSIS — I2510 Atherosclerotic heart disease of native coronary artery without angina pectoris: Secondary | ICD-10-CM | POA: Diagnosis not present

## 2022-02-27 DIAGNOSIS — I509 Heart failure, unspecified: Secondary | ICD-10-CM | POA: Diagnosis not present

## 2022-02-27 DIAGNOSIS — K219 Gastro-esophageal reflux disease without esophagitis: Secondary | ICD-10-CM | POA: Diagnosis not present

## 2022-02-27 DIAGNOSIS — N184 Chronic kidney disease, stage 4 (severe): Secondary | ICD-10-CM | POA: Diagnosis not present

## 2022-03-02 DIAGNOSIS — N184 Chronic kidney disease, stage 4 (severe): Secondary | ICD-10-CM | POA: Diagnosis not present

## 2022-03-11 DIAGNOSIS — R809 Proteinuria, unspecified: Secondary | ICD-10-CM | POA: Diagnosis not present

## 2022-03-11 DIAGNOSIS — N184 Chronic kidney disease, stage 4 (severe): Secondary | ICD-10-CM | POA: Diagnosis not present

## 2022-03-11 DIAGNOSIS — D631 Anemia in chronic kidney disease: Secondary | ICD-10-CM | POA: Diagnosis not present

## 2022-03-11 DIAGNOSIS — E875 Hyperkalemia: Secondary | ICD-10-CM | POA: Diagnosis not present

## 2022-03-11 DIAGNOSIS — I129 Hypertensive chronic kidney disease with stage 1 through stage 4 chronic kidney disease, or unspecified chronic kidney disease: Secondary | ICD-10-CM | POA: Diagnosis not present

## 2022-03-11 DIAGNOSIS — N2581 Secondary hyperparathyroidism of renal origin: Secondary | ICD-10-CM | POA: Diagnosis not present

## 2022-03-13 DIAGNOSIS — N184 Chronic kidney disease, stage 4 (severe): Secondary | ICD-10-CM | POA: Diagnosis not present

## 2022-03-13 DIAGNOSIS — E1121 Type 2 diabetes mellitus with diabetic nephropathy: Secondary | ICD-10-CM | POA: Diagnosis not present

## 2022-03-13 DIAGNOSIS — Z794 Long term (current) use of insulin: Secondary | ICD-10-CM | POA: Diagnosis not present

## 2022-03-13 DIAGNOSIS — I509 Heart failure, unspecified: Secondary | ICD-10-CM | POA: Diagnosis not present

## 2022-03-13 DIAGNOSIS — I2510 Atherosclerotic heart disease of native coronary artery without angina pectoris: Secondary | ICD-10-CM | POA: Diagnosis not present

## 2022-03-13 DIAGNOSIS — K219 Gastro-esophageal reflux disease without esophagitis: Secondary | ICD-10-CM | POA: Diagnosis not present

## 2022-03-13 DIAGNOSIS — M159 Polyosteoarthritis, unspecified: Secondary | ICD-10-CM | POA: Diagnosis not present

## 2022-03-13 DIAGNOSIS — F3341 Major depressive disorder, recurrent, in partial remission: Secondary | ICD-10-CM | POA: Diagnosis not present

## 2022-03-13 DIAGNOSIS — F5104 Psychophysiologic insomnia: Secondary | ICD-10-CM | POA: Diagnosis not present

## 2022-03-27 DIAGNOSIS — N184 Chronic kidney disease, stage 4 (severe): Secondary | ICD-10-CM | POA: Diagnosis not present

## 2022-03-27 DIAGNOSIS — F5104 Psychophysiologic insomnia: Secondary | ICD-10-CM | POA: Diagnosis not present

## 2022-03-27 DIAGNOSIS — Z794 Long term (current) use of insulin: Secondary | ICD-10-CM | POA: Diagnosis not present

## 2022-03-27 DIAGNOSIS — E1121 Type 2 diabetes mellitus with diabetic nephropathy: Secondary | ICD-10-CM | POA: Diagnosis not present

## 2022-03-27 DIAGNOSIS — K219 Gastro-esophageal reflux disease without esophagitis: Secondary | ICD-10-CM | POA: Diagnosis not present

## 2022-03-27 DIAGNOSIS — M159 Polyosteoarthritis, unspecified: Secondary | ICD-10-CM | POA: Diagnosis not present

## 2022-03-27 DIAGNOSIS — F3341 Major depressive disorder, recurrent, in partial remission: Secondary | ICD-10-CM | POA: Diagnosis not present

## 2022-03-27 DIAGNOSIS — I509 Heart failure, unspecified: Secondary | ICD-10-CM | POA: Diagnosis not present

## 2022-03-27 DIAGNOSIS — I2510 Atherosclerotic heart disease of native coronary artery without angina pectoris: Secondary | ICD-10-CM | POA: Diagnosis not present

## 2022-03-27 DIAGNOSIS — E039 Hypothyroidism, unspecified: Secondary | ICD-10-CM | POA: Diagnosis not present

## 2022-04-06 DIAGNOSIS — H1132 Conjunctival hemorrhage, left eye: Secondary | ICD-10-CM | POA: Diagnosis not present

## 2022-04-06 DIAGNOSIS — S0502XA Injury of conjunctiva and corneal abrasion without foreign body, left eye, initial encounter: Secondary | ICD-10-CM | POA: Diagnosis not present

## 2022-04-08 DIAGNOSIS — H1132 Conjunctival hemorrhage, left eye: Secondary | ICD-10-CM | POA: Diagnosis not present

## 2022-04-08 DIAGNOSIS — S0502XA Injury of conjunctiva and corneal abrasion without foreign body, left eye, initial encounter: Secondary | ICD-10-CM | POA: Diagnosis not present

## 2022-04-09 DIAGNOSIS — M159 Polyosteoarthritis, unspecified: Secondary | ICD-10-CM | POA: Diagnosis not present

## 2022-04-09 DIAGNOSIS — Z794 Long term (current) use of insulin: Secondary | ICD-10-CM | POA: Diagnosis not present

## 2022-04-09 DIAGNOSIS — N184 Chronic kidney disease, stage 4 (severe): Secondary | ICD-10-CM | POA: Diagnosis not present

## 2022-04-09 DIAGNOSIS — F3341 Major depressive disorder, recurrent, in partial remission: Secondary | ICD-10-CM | POA: Diagnosis not present

## 2022-04-09 DIAGNOSIS — I509 Heart failure, unspecified: Secondary | ICD-10-CM | POA: Diagnosis not present

## 2022-04-09 DIAGNOSIS — F5104 Psychophysiologic insomnia: Secondary | ICD-10-CM | POA: Diagnosis not present

## 2022-04-09 DIAGNOSIS — E1121 Type 2 diabetes mellitus with diabetic nephropathy: Secondary | ICD-10-CM | POA: Diagnosis not present

## 2022-04-09 DIAGNOSIS — K219 Gastro-esophageal reflux disease without esophagitis: Secondary | ICD-10-CM | POA: Diagnosis not present

## 2022-04-09 DIAGNOSIS — I2510 Atherosclerotic heart disease of native coronary artery without angina pectoris: Secondary | ICD-10-CM | POA: Diagnosis not present

## 2022-04-14 ENCOUNTER — Other Ambulatory Visit (INDEPENDENT_AMBULATORY_CARE_PROVIDER_SITE_OTHER): Payer: Medicare HMO

## 2022-04-14 ENCOUNTER — Other Ambulatory Visit: Payer: Self-pay | Admitting: Endocrinology

## 2022-04-14 DIAGNOSIS — E1065 Type 1 diabetes mellitus with hyperglycemia: Secondary | ICD-10-CM

## 2022-04-14 DIAGNOSIS — E039 Hypothyroidism, unspecified: Secondary | ICD-10-CM

## 2022-04-14 LAB — GLUCOSE, RANDOM: Glucose, Bld: 183 mg/dL — ABNORMAL HIGH (ref 70–99)

## 2022-04-14 LAB — T4, FREE: Free T4: 1.69 ng/dL — ABNORMAL HIGH (ref 0.60–1.60)

## 2022-04-14 LAB — HEMOGLOBIN A1C: Hgb A1c MFr Bld: 6.5 % (ref 4.6–6.5)

## 2022-04-14 LAB — TSH: TSH: 1.53 u[IU]/mL (ref 0.35–5.50)

## 2022-04-16 ENCOUNTER — Ambulatory Visit (INDEPENDENT_AMBULATORY_CARE_PROVIDER_SITE_OTHER): Payer: Medicare HMO | Admitting: Endocrinology

## 2022-04-16 VITALS — BP 132/82 | HR 76 | Ht 62.0 in | Wt 167.0 lb

## 2022-04-16 DIAGNOSIS — E063 Autoimmune thyroiditis: Secondary | ICD-10-CM

## 2022-04-16 DIAGNOSIS — E1065 Type 1 diabetes mellitus with hyperglycemia: Secondary | ICD-10-CM

## 2022-04-16 DIAGNOSIS — Z89429 Acquired absence of other toe(s), unspecified side: Secondary | ICD-10-CM

## 2022-04-16 NOTE — Patient Instructions (Addendum)
Call [facility]  Take 13 [name] if am sugar stays > 150  Exta Novolog 1 unit per 20 mg high sugar and target 150

## 2022-04-16 NOTE — Progress Notes (Unsigned)
Patient ID: Deanna Landry, female   DOB: 05-21-1971, 51 y.o.   MRN: [id number]           Reason for Appointment : Follow-up for Type 1 Diabetes  History of Present Illness   Referring HCP:Lee, Keung         Diagnosis: Type 1 diabetes mellitus, date of diagnosis: Age 50         Previous history:   She has been on insulin since her diagnosis when she had significant symptomatic hypoglycemia. Has been on various types of insulin regimens in the past but usually not followed by endocrinologist The last 5 or 6 years she has been on Levemir and NovoLog Previous level of control is variable with A1c only inconsistently below 7  She was on the T-slim pump started on 03/04/21 Settings: Basal rates: 12 AM- 2 AM = 0.60, 2 AM-8 AM = 0.65, 8 AM-12 AM = 0.50 ISF: 1: 60 except from 2-8 AM, mealtime carbohydrate ratio 1:1 Target 120 Active insulin 5 hours   Recent history:   BASAL insulin doses previously: 12 units Tresiba daily at 7 pm MEALTIME insulin: 10  Non-insulin hypoglycemic drugs: Trulicity 1.5 mg weekly  A1c is 6.5  Current management, blood sugar patterns and problems identified:   She is again not able to get the supplies for her libre sensor because of difficulties with communicating with the supply company  Again not using the pump because she could not see the numbers on the display clearly  Also cannot use the Dexcom since she cannot see the numbers on the display  She appears to be taking somewhat arbitrary doses of NovoLog at mealtimes, mostly 10 to 15 units, usually 8 to 10 units for a sandwich and not actually counting carbohydrates Her meter was programmed about 12 hours behind actual time and difficult to analyze her readings Even though she thinks her fasting readings are over 200 at times they do not appear to be checked recently and not clear if they have been higher Most of her blood sugars appear to be fairly close to normal with only 1 low normal reading of 67 Her  TRESIBA was reduced to 12 units on the last visit and this has reduced hypoglycemia However unclear whether she is adjusting her [name] arbitrarily based on her blood sugar at the time of injection  Also and she thinks she is trying to avoid fried food and generally eat healthier However not losing weight  Dinner 5 pm   PRE-MEAL Fasting Lunch Dinner Bedtime Overall  Glucose range:       Mean/median:        POST-MEAL PC Breakfast PC Lunch PC Dinner  Glucose range:     Mean/median:          PRE-MEAL Fasting Lunch Dinner Bedtime Overall  Glucose range: 117  97    Mean/median:     162   POST-MEAL PC Breakfast PC Lunch PC Dinner  Glucose range: 196 140, 289 67-154  Mean/median:      Previously:   PRE-MEAL Fasting Lunch Dinner Overnight Overall  Glucose range: 36-100  92    Mean/median:     110   POST-MEAL PC Breakfast PC Lunch PC Dinner  Glucose range: 150  69-160  Mean/median:      Previous CGM data:  CGM use % of time   2-week average/SD 166/61  Time in range     66   %  % Time Above 180 3  1  % Time above 250   % Time Below 70 3      PRE-MEAL  overnight  mornings  afternoon  evening Overall  Glucose range:       Averages: 160 153 161 190      Hypoglycemia:  occurs mostly overnight Factors causing hyperglycemia: Excessive Levemir at night Symptoms of hypoglycemia:none or minimal Treatment of hypoglycemia: Juices, if patient is unconscious mother will call the ambulance         Self-care: The diet that the patient has been following is: None, does not know carbohydrate counting Immediately skipping breakfast, lunch will be assignments, dinner usually consists of bread or pasta Snacks will be peanut butter crackers, yogurt, cottage cheese, fruit Eating out only about once or twice a month   Mealtimes are: Breakfast none Lunch: 1 pm Dinner: 6pm         Exercise: None          Dietician consultation none.          Wt Readings from Last 3 Encounters:   04/16/22 167 lb (75.8 kg)  02/12/22 172 lb 6.4 oz (78.2 kg)  12/29/21 174 lb (78.9 kg)    Diabetes labs:  Lab Results  Component Value Date   HGBA1C 6.5 04/14/2022   HGBA1C 6.9 (H) 12/09/2021   HGBA1C 7.1 (H) 09/02/2021   Lab Results  Component Value Date   MICROALBUR 5.9 (H) 02/05/2022   LDLCALC 57 09/02/2021   CREATININE 2.05 (H) 12/09/2021    Lab Results  Component Value Date   MICRALBCREAT 4.3 02/05/2022     Allergies as of 04/16/2022   No Known Allergies      Medication List        Accurate as of April 16, 2022  3:01 PM. If you have any questions, ask your nurse or doctor.          Accu-Chek Guide test strip Generic drug: glucose blood Use to check blood sugar 5 times daily. Dx E 10.65   acetaminophen 325 MG tablet Commonly known as: TYLENOL Take 650 mg by mouth every 6 (six) hours as needed for mild pain.   albuterol 108 (90 Base) MCG/ACT inhaler Commonly known as: VENTOLIN HFA Inhale 1-2 puffs into the lungs every 6 (six) hours as needed for wheezing or shortness of breath.   ALPRAZolam 0.25 MG tablet Commonly known as: XANAX Take 0.25 mg by mouth every 6 (six) hours as needed for anxiety or sleep.   aspirin EC 81 MG tablet Take 81 mg by mouth at bedtime.   atorvastatin 80 MG tablet Commonly known as: LIPITOR Take 80 mg by mouth at bedtime.   buPROPion 300 MG 24 hr tablet Commonly known as: WELLBUTRIN XL Take 300 mg by mouth at bedtime.   clopidogrel 75 MG tablet Commonly known as: PLAVIX Take 75 mg by mouth daily.   Dexcom G6 Receiver Devi Continuous glucose monitoring   Dexcom G6 Sensor Misc Apply new sensor every 10 days   Dexcom G6 Transmitter Misc Replace every 3 months   Easy Comfort Pen Needles 31G X 8 MM Misc Generic drug: Insulin Pen Needle Check sugar 3x  daily   Gvoke HypoPen 2-Pack 1 MG/0.2ML Soaj Generic drug: Glucagon Inject 1 mg into the skin as needed. For severe lows What changed: reasons to take this    haloperidol 5 MG tablet Commonly known as: HALDOL Take 5 mg by mouth 2 (two) times daily.   HYDROcodone-acetaminophen 5-325 MG tablet Commonly known  as: NORCO/VICODIN Take 1 tablet by mouth every 8 (eight) hours as needed for moderate pain.   insulin degludec 100 UNIT/ML FlexTouch Pen Commonly known as: TRESIBA Inject 14 Units into the skin daily. Adjust as directed   isosorbide dinitrate 20 MG tablet Commonly known as: ISORDIL Take 20 mg by mouth 2 (two) times daily.   levothyroxine 50 MCG tablet Commonly known as: SYNTHROID Take 50 mcg by mouth daily before breakfast.   Lokelma 10 g Pack packet Generic drug: sodium zirconium cyclosilicate Take 10 g by mouth See admin instructions. MON and FRI   loratadine 10 MG tablet Commonly known as: CLARITIN Take 10 mg by mouth daily as needed for allergies.   methocarbamol 750 MG tablet Commonly known as: Robaxin-750 Take 1 tablet (750 mg total) by mouth every 6 (six) hours as needed for muscle spasms.   metoCLOPramide 5 MG tablet Commonly known as: REGLAN Take 1 tablet (5 mg total) by mouth 4 (four) times daily -  before meals and at bedtime.   metoprolol tartrate 25 MG tablet Commonly known as: LOPRESSOR Take 25 mg by mouth daily.   NovoLOG FlexPen 100 UNIT/ML FlexPen Generic drug: insulin aspart Use up to 15units ac TID   pantoprazole 40 MG tablet Commonly known as: PROTONIX Take 40 mg by mouth at bedtime.   pregabalin 75 MG capsule Commonly known as: LYRICA Take 75 mg by mouth 2 (two) times daily.   ranolazine 500 MG 12 hr tablet Commonly known as: RANEXA TAKE 1 TABLET BY MOUTH TWICE A DAY   Rocklatan 0.02-0.005 % Soln Generic drug: Netarsudil-Latanoprost Place 1 drop into the left eye at bedtime.   senna-docusate 8.6-50 MG tablet Commonly known as: Senokot-S Take 2 tablets by mouth 2 (two) times daily.   sertraline 100 MG tablet Commonly known as: ZOLOFT Take 100 mg by mouth at bedtime.   Systane  0.4-0.3 % Gel ophthalmic gel Generic drug: Polyethyl Glycol-Propyl Glycol Place 1 application. into both eyes 3 (three) times daily as needed (dry/irritated eyes.).   Trulicity 1.5 BM/8.4XL Sopn Generic drug: Dulaglutide Inject 1.5 mg into the skin every Monday.        Allergies: No Known Allergies  Past Medical History:  Diagnosis Date   Acute kidney injury superimposed on CKD ([place]) 03/15/2019   Acute respiratory failure with hypoxia ([place]) 03/15/2019   Adjustment disorder with mixed anxiety and depressed mood 03/11/2019   Anemia    Anxiety    Arthritis    right ankle   Blind    right eye   CHF (congestive heart failure) ([place])    Closed right pilon fracture, initial encounter 07/26/2019   COPD (chronic obstructive pulmonary disease) (H[facility])    Coronary artery disease    Depression    Diabetes mellitus type 2 in obese ([place]) 07/26/2019   Diabetes mellitus without complication ([place])    type 1   Diabetic polyneuropathy ([place]) 05/04/2016   Dyslipidemia 01/03/2020   Fever 03/10/2019   GERD (gastroesophageal reflux disease)    History of blood transfusion    HLD (hyperlipidemia)    Hyperglycemia    Hypertension    Hypothyroidism    Ileus, postoperative ([place])    Major depression, recurrent ([place]) 03/09/2019   MDD (major depressive disorder), severe ([place]) 03/09/2019   Myocardial infarct, old    2020, s/p DES RCA, RPDA 10/23/19   Neck pain 04/21/2016   Pneumonia 10/2019   Renal disorder    stage 3   Sepsis ([place]) 03/10/2019  Short-term memory loss    mild   Sleep apnea    study done 9/27 and 08/13/20 - no results yet 08/21/20   Stroke ([place])    Suicidal ideation 03/15/2019   Type 1 diabetes mellitus without complication ([place]) 4/97/0263   Wound infection after surgery (Right ankle) 10/04/2019    Past Surgical History:  Procedure Laterality Date   ABDOMINAL HYSTERECTOMY  2014   ANKLE FUSION Right 08/23/2020   Procedure: ANKLE FUSION;  Surgeon: Shona Needles, MD;  Location: [place];   Service: Orthopedics;  Laterality: Right;   APPLICATION OF WOUND VAC Right 10/11/2019   Procedure: WOUND VAC CHANGE TO RIGHT LEG;  Surgeon: Shona Needles, MD;  Location: [place];  Service: Orthopedics;  Laterality: Right;   CORONARY ANGIOPLASTY  10/23/2019   DES RCA, DES RPDA 10/23/19 [facility])   EYE SURGERY     HARDWARE REMOVAL Right 10/06/2019   Procedure: HARDWARE REMOVAL;  Surgeon: Shona Needles, MD;  Location: [place];  Service: Orthopedics;  Laterality: Right;   HARDWARE REMOVAL Right 08/23/2020   Procedure: HARDWARE REMOVAL;  Surgeon: Shona Needles, MD;  Location: [place];  Service: Orthopedics;  Laterality: Right;   I & D EXTREMITY Right 10/06/2019   Procedure: IRRIGATION AND DEBRIDEMENT EXTREMITY;  Surgeon: Shona Needles, MD;  Location: [place];  Service: Orthopedics;  Laterality: Right;   I & D EXTREMITY Right 10/09/2019   Procedure: IRRIGATION AND DEBRIDEMENT EXTREMITY and WOUND VAC CHANGE RIGHT ANKLE;  Surgeon: Shona Needles, MD;  Location: [place];  Service: Orthopedics;  Laterality: Right;   ORIF ANKLE FRACTURE Right 07/26/2019   Procedure: OPEN REDUCTION INTERNAL FIXATION (ORIF) ANKLE FRACTURE;  Surgeon: Shona Needles, MD;  Location: [place];  Service: Orthopedics;  Laterality: Right;  OPEN REDUCTION INTERNAL FIXATION (ORIF) ANKLE FRACTURE    TOE AMPUTATION      Family History  Problem Relation Age of Onset   Diabetes Mellitus II Mother    Stroke Mother    Heart attack Mother    Stomach cancer Father    Congestive Heart Failure Father    Diabetes Sister    Congestive Heart Failure Maternal Grandfather     Social History:  reports that she has never smoked. She has never used smokeless tobacco. She reports that she does not currently use alcohol. She reports that she does not currently use drugs.      Review of Systems    HYPOTHYROIDISM: Treated by PCP with levothyroxine 50 mcg and last TSH 4.7 done in 8/21 Last TSH from PCP was in 9/22 and was normal      Lipids:    She is on a statin drug, currently on 80 mg Lipitor  Lab Results  Component Value Date   CHOL 117 09/02/2021   HDL 46.80 09/02/2021   LDLCALC 57 09/02/2021   TRIG 63.0 09/02/2021   CHOLHDL 2 09/02/2021    Last dilated eye exam was in 3/22  Peripheral neuropathy: She will get at times pains and paresthesia in her legs treated with Lyrica and tramadol.   Also has numbness in her hands  DIABETES COMPLICATIONS: Neuropathy, retinopathy nephropathy   CKD: Labs as follows, regularly seen by nephrologist   Lab Results  Component Value Date   CREATININE 2.05 (H) 12/09/2021   CREATININE 2.42 (H) 09/02/2021   CREATININE 2.14 (H) 07/29/2021   Blood pressure being monitored by nephrologist  BP Readings from Last 3 Encounters:  04/16/22 132/82  02/12/22 130/76  12/29/21 (!) 142/90       Physical Examination:  BP 132/82   Pulse 76   Ht 5\' 2"  (1.575 m)   Wt 167 lb (75.8 kg)   LMP  (LMP Unknown) Comment: hysterectomy 2014  SpO2 99%   BMI 30.54 kg/m     ASSESSMENT:  Diabetes type 1, on basal bolus insulin  A1c is last 6.9  See history of present illness for detailed discussion of current diabetes management, blood sugar patterns and problems identified  She is not able to continue insulin pump because of visual difficulties  Her blood sugars are difficult to assess because of lack of CGM and her meter being programmed to the wrong date and time Most of her blood sugars are looking good and no significant hypoglycemia as discussed in detail above   PLAN:   Her meter was programmed to the right date and time Given her the list of suppliers for freestyle libre 2: Start using this medicine is available She will need to start checking by rotation at different times of the day including fasting at least every other day Also should check before each meal to help adjust her Premeal dose No change in [place] at this time Continue to adjust NovoLog and take before  eating based on meal size and carbohydrate intake  There are no Patient Instructions on file for this visit.    Elayne Snare 04/16/2022, 3:01 PM      Note: This note was prepared with Dragon voice recognition system technology. Any transcriptional errors that result from this process are unintentional.

## 2022-04-23 DIAGNOSIS — Z794 Long term (current) use of insulin: Secondary | ICD-10-CM | POA: Diagnosis not present

## 2022-04-23 DIAGNOSIS — F3341 Major depressive disorder, recurrent, in partial remission: Secondary | ICD-10-CM | POA: Diagnosis not present

## 2022-04-23 DIAGNOSIS — E1121 Type 2 diabetes mellitus with diabetic nephropathy: Secondary | ICD-10-CM | POA: Diagnosis not present

## 2022-04-23 DIAGNOSIS — M159 Polyosteoarthritis, unspecified: Secondary | ICD-10-CM | POA: Diagnosis not present

## 2022-04-23 DIAGNOSIS — E785 Hyperlipidemia, unspecified: Secondary | ICD-10-CM | POA: Diagnosis not present

## 2022-04-23 DIAGNOSIS — N184 Chronic kidney disease, stage 4 (severe): Secondary | ICD-10-CM | POA: Diagnosis not present

## 2022-04-23 DIAGNOSIS — F5104 Psychophysiologic insomnia: Secondary | ICD-10-CM | POA: Diagnosis not present

## 2022-04-23 DIAGNOSIS — K219 Gastro-esophageal reflux disease without esophagitis: Secondary | ICD-10-CM | POA: Diagnosis not present

## 2022-04-23 DIAGNOSIS — I2510 Atherosclerotic heart disease of native coronary artery without angina pectoris: Secondary | ICD-10-CM | POA: Diagnosis not present

## 2022-05-08 DIAGNOSIS — E785 Hyperlipidemia, unspecified: Secondary | ICD-10-CM | POA: Diagnosis not present

## 2022-05-08 DIAGNOSIS — E1121 Type 2 diabetes mellitus with diabetic nephropathy: Secondary | ICD-10-CM | POA: Diagnosis not present

## 2022-05-08 DIAGNOSIS — Z794 Long term (current) use of insulin: Secondary | ICD-10-CM | POA: Diagnosis not present

## 2022-05-08 DIAGNOSIS — M159 Polyosteoarthritis, unspecified: Secondary | ICD-10-CM | POA: Diagnosis not present

## 2022-05-08 DIAGNOSIS — L0390 Cellulitis, unspecified: Secondary | ICD-10-CM | POA: Diagnosis not present

## 2022-05-08 DIAGNOSIS — N184 Chronic kidney disease, stage 4 (severe): Secondary | ICD-10-CM | POA: Diagnosis not present

## 2022-05-08 DIAGNOSIS — F3341 Major depressive disorder, recurrent, in partial remission: Secondary | ICD-10-CM | POA: Diagnosis not present

## 2022-05-08 DIAGNOSIS — K219 Gastro-esophageal reflux disease without esophagitis: Secondary | ICD-10-CM | POA: Diagnosis not present

## 2022-05-08 DIAGNOSIS — I2510 Atherosclerotic heart disease of native coronary artery without angina pectoris: Secondary | ICD-10-CM | POA: Diagnosis not present

## 2022-05-22 DIAGNOSIS — E1121 Type 2 diabetes mellitus with diabetic nephropathy: Secondary | ICD-10-CM | POA: Diagnosis not present

## 2022-05-22 DIAGNOSIS — E785 Hyperlipidemia, unspecified: Secondary | ICD-10-CM | POA: Diagnosis not present

## 2022-05-22 DIAGNOSIS — F3341 Major depressive disorder, recurrent, in partial remission: Secondary | ICD-10-CM | POA: Diagnosis not present

## 2022-05-22 DIAGNOSIS — Z794 Long term (current) use of insulin: Secondary | ICD-10-CM | POA: Diagnosis not present

## 2022-05-22 DIAGNOSIS — M159 Polyosteoarthritis, unspecified: Secondary | ICD-10-CM | POA: Diagnosis not present

## 2022-05-22 DIAGNOSIS — F5104 Psychophysiologic insomnia: Secondary | ICD-10-CM | POA: Diagnosis not present

## 2022-05-22 DIAGNOSIS — N184 Chronic kidney disease, stage 4 (severe): Secondary | ICD-10-CM | POA: Diagnosis not present

## 2022-05-22 DIAGNOSIS — I2510 Atherosclerotic heart disease of native coronary artery without angina pectoris: Secondary | ICD-10-CM | POA: Diagnosis not present

## 2022-05-22 DIAGNOSIS — K219 Gastro-esophageal reflux disease without esophagitis: Secondary | ICD-10-CM | POA: Diagnosis not present

## 2022-06-05 DIAGNOSIS — I2510 Atherosclerotic heart disease of native coronary artery without angina pectoris: Secondary | ICD-10-CM | POA: Diagnosis not present

## 2022-06-05 DIAGNOSIS — N184 Chronic kidney disease, stage 4 (severe): Secondary | ICD-10-CM | POA: Diagnosis not present

## 2022-06-05 DIAGNOSIS — R1310 Dysphagia, unspecified: Secondary | ICD-10-CM | POA: Diagnosis not present

## 2022-06-05 DIAGNOSIS — E1121 Type 2 diabetes mellitus with diabetic nephropathy: Secondary | ICD-10-CM | POA: Diagnosis not present

## 2022-06-05 DIAGNOSIS — M159 Polyosteoarthritis, unspecified: Secondary | ICD-10-CM | POA: Diagnosis not present

## 2022-06-05 DIAGNOSIS — Z794 Long term (current) use of insulin: Secondary | ICD-10-CM | POA: Diagnosis not present

## 2022-06-05 DIAGNOSIS — K219 Gastro-esophageal reflux disease without esophagitis: Secondary | ICD-10-CM | POA: Diagnosis not present

## 2022-06-05 DIAGNOSIS — F3341 Major depressive disorder, recurrent, in partial remission: Secondary | ICD-10-CM | POA: Diagnosis not present

## 2022-06-05 DIAGNOSIS — E785 Hyperlipidemia, unspecified: Secondary | ICD-10-CM | POA: Diagnosis not present

## 2022-06-08 DIAGNOSIS — H3581 Retinal edema: Secondary | ICD-10-CM | POA: Diagnosis not present

## 2022-06-13 ENCOUNTER — Other Ambulatory Visit: Payer: Self-pay | Admitting: Endocrinology

## 2022-06-13 DIAGNOSIS — E1065 Type 1 diabetes mellitus with hyperglycemia: Secondary | ICD-10-CM

## 2022-06-17 ENCOUNTER — Emergency Department (HOSPITAL_COMMUNITY): Payer: Medicare HMO

## 2022-06-17 ENCOUNTER — Emergency Department (HOSPITAL_COMMUNITY)
Admission: EM | Admit: 2022-06-17 | Discharge: 2022-06-18 | Disposition: A | Discharge status: Home / Self Care | Payer: Medicare HMO | Attending: Emergency Medicine | Admitting: Emergency Medicine

## 2022-06-17 ENCOUNTER — Encounter (HOSPITAL_COMMUNITY): Payer: Self-pay | Admitting: Emergency Medicine

## 2022-06-17 DIAGNOSIS — Z79899 Other long term (current) drug therapy: Secondary | ICD-10-CM | POA: Diagnosis not present

## 2022-06-17 DIAGNOSIS — R262 Difficulty in walking, not elsewhere classified: Secondary | ICD-10-CM | POA: Insufficient documentation

## 2022-06-17 DIAGNOSIS — Z794 Long term (current) use of insulin: Secondary | ICD-10-CM | POA: Insufficient documentation

## 2022-06-17 DIAGNOSIS — I959 Hypotension, unspecified: Secondary | ICD-10-CM | POA: Diagnosis not present

## 2022-06-17 DIAGNOSIS — R531 Weakness: Secondary | ICD-10-CM | POA: Insufficient documentation

## 2022-06-17 DIAGNOSIS — E1165 Type 2 diabetes mellitus with hyperglycemia: Secondary | ICD-10-CM | POA: Diagnosis not present

## 2022-06-17 DIAGNOSIS — R0602 Shortness of breath: Secondary | ICD-10-CM | POA: Insufficient documentation

## 2022-06-17 DIAGNOSIS — Z7982 Long term (current) use of aspirin: Secondary | ICD-10-CM | POA: Diagnosis not present

## 2022-06-17 DIAGNOSIS — E1122 Type 2 diabetes mellitus with diabetic chronic kidney disease: Secondary | ICD-10-CM | POA: Insufficient documentation

## 2022-06-17 DIAGNOSIS — N189 Chronic kidney disease, unspecified: Secondary | ICD-10-CM | POA: Insufficient documentation

## 2022-06-17 DIAGNOSIS — Z7901 Long term (current) use of anticoagulants: Secondary | ICD-10-CM | POA: Insufficient documentation

## 2022-06-17 DIAGNOSIS — R739 Hyperglycemia, unspecified: Secondary | ICD-10-CM

## 2022-06-17 DIAGNOSIS — R000 Tachycardia, unspecified: Secondary | ICD-10-CM | POA: Diagnosis not present

## 2022-06-17 DIAGNOSIS — R0689 Other abnormalities of breathing: Secondary | ICD-10-CM | POA: Diagnosis not present

## 2022-06-17 DIAGNOSIS — R4182 Altered mental status, unspecified: Secondary | ICD-10-CM | POA: Diagnosis not present

## 2022-06-17 LAB — BASIC METABOLIC PANEL
Anion gap: 13 (ref 5–15)
BUN: 27 mg/dL — ABNORMAL HIGH (ref 6–20)
CO2: 27 mmol/L (ref 22–32)
Calcium: 9.6 mg/dL (ref 8.9–10.3)
Chloride: 98 mmol/L (ref 98–111)
Creatinine, Ser: 2.08 mg/dL — ABNORMAL HIGH (ref 0.44–1.00)
GFR, Estimated: 29 mL/min — ABNORMAL LOW (ref >60)
Glucose, Bld: 271 mg/dL — ABNORMAL HIGH (ref 70–99)
Potassium: 3.8 mmol/L (ref 3.5–5.1)
Sodium: 138 mmol/L (ref 135–145)

## 2022-06-17 LAB — URINALYSIS, ROUTINE W REFLEX MICROSCOPIC
Bacteria, UA: NONE SEEN
Bilirubin Urine: NEGATIVE
Glucose, UA: >=500 mg/dL — AB
Hgb urine dipstick: NEGATIVE
Ketones, ur: 20 mg/dL — AB
Leukocytes,Ua: NEGATIVE
Nitrite: NEGATIVE
Protein, ur: 30 mg/dL — AB
Specific Gravity, Urine: 1.017 (ref 1.005–1.030)
pH: 5 (ref 5.0–8.0)

## 2022-06-17 LAB — CBC
HCT: 41.7 % (ref 36.0–46.0)
Hemoglobin: 14.2 g/dL (ref 12.0–15.0)
MCH: 29.2 pg (ref 26.0–34.0)
MCHC: 34.1 g/dL (ref 30.0–36.0)
MCV: 85.6 fL (ref 80.0–100.0)
Platelets: 337 10*3/uL (ref 150–400)
RBC: 4.87 MIL/uL (ref 3.87–5.11)
RDW: 13.2 % (ref 11.5–15.5)
WBC: 8.6 10*3/uL (ref 4.0–10.5)
nRBC: 0 % (ref 0.0–0.2)

## 2022-06-17 LAB — HEPATIC FUNCTION PANEL
ALT: 14 U/L (ref 0–44)
AST: 24 U/L (ref 15–41)
Albumin: 2.8 g/dL — ABNORMAL LOW (ref 3.5–5.0)
Alkaline Phosphatase: 57 U/L (ref 38–126)
Bilirubin, Direct: 0.2 mg/dL (ref 0.0–0.2)
Indirect Bilirubin: 0.8 mg/dL (ref 0.3–0.9)
Total Bilirubin: 1 mg/dL (ref 0.3–1.2)
Total Protein: 5.1 g/dL — ABNORMAL LOW (ref 6.5–8.1)

## 2022-06-17 LAB — TSH: TSH: 1.849 u[IU]/mL (ref 0.350–4.500)

## 2022-06-17 LAB — CBG MONITORING, ED
Glucose-Capillary: 238 mg/dL — ABNORMAL HIGH (ref 70–99)
Glucose-Capillary: 242 mg/dL — ABNORMAL HIGH (ref 70–99)

## 2022-06-17 LAB — MAGNESIUM: Magnesium: 1.5 mg/dL — ABNORMAL LOW (ref 1.7–2.4)

## 2022-06-17 MED ORDER — MAGNESIUM SULFATE 2 GM/50ML IV SOLN
2.0000 g | Freq: Once | INTRAVENOUS | Status: AC
  Administered 2022-06-17: 2 g via INTRAVENOUS
  Filled 2022-06-17: qty 50

## 2022-06-17 MED ORDER — SODIUM CHLORIDE 0.9 % IV BOLUS
1000.0000 mL | Freq: Once | INTRAVENOUS | Status: AC
  Administered 2022-06-17: 1000 mL via INTRAVENOUS

## 2022-06-17 NOTE — ED Provider Triage Note (Addendum)
Emergency Medicine Provider Triage Evaluation Note  Deanna Landry , a 51 y.o. female  was evaluated in triage.  Pt complains of hyperglycemia and a tremor. The patient is legally blind and mentions that her vision is getting worse. She had a CBG of 345 and was lowered after giving herself 12 units of novolog. H/o stroke, COPD, DM. She reports that she has had a full body tremor for the past 2 weeks as well.   Review of Systems  Positive:  Negative:   Physical Exam  BP (!) 144/78 (BP Location: Right Arm)   Pulse 93   Temp 98 F (36.7 C)   Resp 15   LMP  (LMP Unknown) Comment: hysterectomy 2014  SpO2 100%  Gen:   Awake, no distress   Resp:  Normal effort  MSK:   Moves extremities without difficulty  Other:  Right-sided deficit from previous stroke  Medical Decision Making  Medically screening exam initiated at 12:56 PM.  Appropriate orders placed.  Zakyria D Miltner was informed that the remainder of the evaluation will be completed by another provider, this initial triage assessment does not replace that evaluation, and the importance of remaining in the ED until their evaluation is complete.  Patient in no acute distress blood sugar is 242 here. Will continue to monitor. Labs placed.    Sherrell Puller, PA-C 06/17/22 1259    Sherrell Puller, [place] 06/17/22 1302

## 2022-06-17 NOTE — Discharge Instructions (Signed)
You are seen in the emergency department for worsening general weakness poor intake.  Your blood sugar was elevated here.  Your CAT scan did not show any signs of acute stroke and you did not have a urine infection or pneumonia.  Please continue your regular medications and follow-up with your primary care doctor.  We also put a referral in for you to follow-up with neurology.  Return if any worsening or concerning symptoms.

## 2022-06-17 NOTE — ED Provider Notes (Signed)
[facility] Provider Note   CSN: [id number] Arrival date & time: 06/17/22  1224     History {Add pertinent medical, surgical, social history, OB history to HPI:1} Chief Complaint  Patient presents with   Hyperglycemia    Deanna Landry is a 51 y.o. female.  She has a history of decreased vision, diabetes, CKD.  She has been progressively weak over the course of 1 month more significantly over the last few days.  Currently now unable to ambulate due to her weakness.  Has been having tremor of her upper extremities.  Her vision also seems more blurry than her baseline which is legally blind.  She has not seen her PCP recently for this but is supposedly having an outpatient evaluation by an ophthalmologist visiting in September.  Blood sugars have been elevated.  Decreased appetite.  She denies any chest pain abdominal pain.  She sometimes feels short of breath.  The history is provided by the patient and a relative.  Weakness Severity:  Severe Onset quality:  Gradual Duration:  3 days Timing:  Constant Chronicity:  New Relieved by:  Nothing Worsened by:  Activity Ineffective treatments:  None tried Associated symptoms: difficulty walking, shortness of breath and vision change   Associated symptoms: no abdominal pain, no chest pain, no fever and no vomiting   Risk factors: neurologic disease        Home Medications Prior to Admission medications   Medication Sig Start Date End Date Taking? Authorizing Provider  acetaminophen (TYLENOL) 325 MG tablet Take 650 mg by mouth every 6 (six) hours as needed for mild pain.    [provider]  albuterol (VENTOLIN HFA) 108 (90 Base) MCG/ACT inhaler Inhale 1-2 puffs into the lungs every 6 (six) hours as needed for wheezing or shortness of breath.    [provider]  ALPRAZolam Duanne Moron) 0.25 MG tablet Take 0.25 mg by mouth every 6 (six) hours as needed for anxiety or sleep. 04/11/21    [provider]  aspirin EC 81 MG tablet Take 81 mg by mouth at bedtime.    [provider]  atorvastatin (LIPITOR) 80 MG tablet Take 80 mg by mouth at bedtime. 12/28/18   [provider]  buPROPion (WELLBUTRIN XL) 300 MG 24 hr tablet Take 300 mg by mouth at bedtime. 09/21/19   [provider]  clopidogrel (PLAVIX) 75 MG tablet Take 75 mg by mouth daily. 01/15/19   [provider]  Continuous Blood Gluc Receiver (DEXCOM G6 RECEIVER) DEVI Continuous glucose monitoring 03/18/21   Elayne Snare, MD  Continuous Blood Gluc Sensor (DEXCOM G6 SENSOR) MISC Apply new sensor every 10 days 03/26/21   Elayne Snare, MD  Continuous Blood Gluc Transmit (DEXCOM G6 TRANSMITTER) MISC Replace every 3 months 03/26/21   Elayne Snare, MD  Glucagon (GVOKE HYPOPEN 2-PACK) 1 MG/0.2ML SOAJ Inject 1 mg into the skin as needed. For severe lows Patient taking differently: Inject 1 mg into the skin as needed (low blood sugar). For severe lows 04/26/20   Elayne Snare, MD  glucose blood (ACCU-CHEK GUIDE) test strip Use to check blood sugar 5 times daily. Dx E 10.65 12/29/21   Elayne Snare, MD  haloperidol (HALDOL) 5 MG tablet Take 5 mg by mouth 2 (two) times daily.    [provider]  HYDROcodone-acetaminophen (NORCO/VICODIN) 5-325 MG tablet Take 1 tablet by mouth every 8 (eight) hours as needed for moderate pain. 09/20/20   [provider]  insulin aspart (  NOVOLOG FLEXPEN) 100 UNIT/ML FlexPen 3-8 UNITS WITH MEALS 3 TIMES A DAY 06/16/22   Elayne Snare, MD  insulin degludec (TRESIBA) 100 UNIT/ML FlexTouch Pen Inject 14 Units into the skin daily. Adjust as directed 02/12/22   Elayne Snare, MD  Insulin Pen Needle (EASY COMFORT PEN NEEDLES) 31G X 8 MM MISC Check sugar 3x  daily Patient taking differently: Check sugar 3x  daily 08/12/20   Elayne Snare, MD  isosorbide dinitrate (ISORDIL) 20 MG tablet Take 20 mg by mouth 2 (two) times daily. 01/27/19   [provider]  levothyroxine  (SYNTHROID) 50 MCG tablet Take 50 mcg by mouth daily before breakfast.  02/02/19   [provider]  loratadine (CLARITIN) 10 MG tablet Take 10 mg by mouth daily as needed for allergies.    [provider]  methocarbamol (ROBAXIN-750) 750 MG tablet Take 1 tablet (750 mg total) by mouth every 6 (six) hours as needed for muscle spasms. 08/23/20   Haddix, Thomasene Lot, MD  metoCLOPramide (REGLAN) 5 MG tablet Take 1 tablet (5 mg total) by mouth 4 (four) times daily -  before meals and at bedtime. 12/04/20   Bonnielee Haff, MD  metoprolol tartrate (LOPRESSOR) 25 MG tablet Take 25 mg by mouth daily. 10/25/19   [provider]  Netarsudil-Latanoprost (ROCKLATAN) 0.02-0.005 % SOLN Place 1 drop into the left eye at bedtime.    [provider]  pantoprazole (PROTONIX) 40 MG tablet Take 40 mg by mouth at bedtime. 12/29/18   [provider]  Polyethyl Glycol-Propyl Glycol (SYSTANE) 0.4-0.3 % GEL ophthalmic gel Place 1 application. into both eyes 3 (three) times daily as needed (dry/irritated eyes.).    [provider]  pregabalin (LYRICA) 75 MG capsule Take 75 mg by mouth 2 (two) times daily. 02/11/19   [provider]  ranolazine (RANEXA) 500 MG 12 hr tablet TAKE 1 TABLET BY MOUTH TWICE A DAY 08/25/21   Park Liter, MD  senna-docusate (SENOKOT-S) 8.6-50 MG tablet Take 2 tablets by mouth 2 (two) times daily. 12/04/20   Bonnielee Haff, MD  sertraline (ZOLOFT) 100 MG tablet Take 100 mg by mouth at bedtime. 01/27/19   [provider]  sodium zirconium cyclosilicate (LOKELMA) 10 g PACK packet Take 10 g by mouth See admin instructions. MON and FRI    [provider]  TRULICITY 1.5 RS/8.5IO SOPN Inject 1.5 mg into the skin every Monday.  02/07/19   [provider]      Allergies    Patient has no known allergies.    Review of Systems   Review of Systems  Constitutional:  Positive for fatigue. Negative for fever.  Eyes:  Positive  for visual disturbance.  Respiratory:  Positive for shortness of breath.   Cardiovascular:  Negative for chest pain.  Gastrointestinal:  Negative for abdominal pain and vomiting.  Neurological:  Positive for tremors and weakness.    Physical Exam Updated Vital Signs BP 137/81   Pulse (!) 101   Temp 98.4 F (36.9 C) (Oral)   Resp 15   LMP  (LMP Unknown) Comment: hysterectomy 2014  SpO2 97%  Physical Exam Vitals and nursing note reviewed.  Constitutional:      General: She is not in acute distress.    Appearance: Normal appearance. She is well-developed. She is obese.  HENT:     Head: Normocephalic and atraumatic.  Eyes:     Conjunctiva/sclera: Conjunctivae normal.  Cardiovascular:     Rate and Rhythm: Regular rhythm. Tachycardia  present.     Heart sounds: No murmur heard. Pulmonary:     Effort: Pulmonary effort is normal. No respiratory distress.     Breath sounds: Normal breath sounds.  Abdominal:     Palpations: Abdomen is soft.     Tenderness: There is no abdominal tenderness. There is no guarding or rebound.  Musculoskeletal:        General: No swelling.     Cervical back: Neck supple.  Skin:    General: Skin is warm and dry.     Capillary Refill: Capillary refill takes less than 2 seconds.  Neurological:     General: No focal deficit present.     Mental Status: She is alert.     Comments: She is awake alert.  She is slow to move.  She has rather poor effort on all 4 extremities but is moving them symmetrically.  She is unable to perform finger-to-nose but stops about halfway.  Does not appear grossly ataxic.  Sensation intact to light touch.  No facial asymmetry or slurred speech.     ED Results / Procedures / Treatments   Labs (all labs ordered are listed, but only abnormal results are displayed) Labs Reviewed  BASIC METABOLIC PANEL - Abnormal; Notable for the following components:      Result Value   Glucose, Bld 271 (*)    BUN 27 (*)    Creatinine, Ser  2.08 (*)    GFR, Estimated 29 (*)    All other components within normal limits  CBG MONITORING, ED - Abnormal; Notable for the following components:   Glucose-Capillary 238 (*)    All other components within normal limits  CBG MONITORING, ED - Abnormal; Notable for the following components:   Glucose-Capillary 242 (*)    All other components within normal limits  CBC  URINALYSIS, ROUTINE W REFLEX MICROSCOPIC  HEPATIC FUNCTION PANEL  MAGNESIUM  CBG MONITORING, ED    EKG None  Radiology No results found.  Procedures Procedures  {Document cardiac monitor, telemetry assessment procedure when appropriate:1}  Medications Ordered in ED Medications - No data to display  ED Course/ Medical Decision Making/ A&P                           Medical Decision Making Amount and/or Complexity of Data Reviewed Labs: ordered. Radiology: ordered.  This patient complains of ***; this involves an extensive number of treatment Options and is a complaint that carries with it a high risk of complications and morbidity. The differential includes ***  I ordered, reviewed and interpreted labs, which included *** I ordered medication *** and reviewed PMP when indicated. I ordered imaging studies which included *** and I independently    visualized and interpreted imaging which showed *** Additional history obtained from *** Previous records obtained and reviewed *** I consulted *** and discussed lab and imaging findings and discussed disposition.  Cardiac monitoring reviewed, *** Social determinants considered, *** Critical Interventions: ***  After the interventions stated above, I reevaluated the patient and found *** Admission and further testing considered, ***    {Document critical care time when appropriate:1} {Document review of labs and clinical decision tools ie heart score, Chads2Vasc2 etc:1}  {Document your independent review of radiology images, and any outside  records:1} {Document your discussion with family members, caretakers, and with consultants:1} {Document social determinants of health affecting pt's care:1} {Document your decision making why or why not admission, treatments were needed:1}  Final Clinical Impression(s) / ED Diagnoses Final diagnoses:  None    Rx / DC Orders ED Discharge Orders     None

## 2022-06-17 NOTE — ED Triage Notes (Addendum)
Patient BIB [facility] from home for complaint of hyperglycemia and intermittent tremor that started two weeks ago. CBG 345 initially, lowered to 240 after patient self-administer 12 units of novolog. Patient is alert, oriented, blind, and in no apparent distress at this time.  EMS Vitals BP 128/62 HR 103 SpO2 95% on room air

## 2022-06-19 ENCOUNTER — Telehealth: Payer: Self-pay

## 2022-06-19 DIAGNOSIS — I2510 Atherosclerotic heart disease of native coronary artery without angina pectoris: Secondary | ICD-10-CM | POA: Diagnosis not present

## 2022-06-19 DIAGNOSIS — I679 Cerebrovascular disease, unspecified: Secondary | ICD-10-CM | POA: Diagnosis not present

## 2022-06-19 DIAGNOSIS — F3341 Major depressive disorder, recurrent, in partial remission: Secondary | ICD-10-CM | POA: Diagnosis not present

## 2022-06-19 DIAGNOSIS — K219 Gastro-esophageal reflux disease without esophagitis: Secondary | ICD-10-CM | POA: Diagnosis not present

## 2022-06-19 DIAGNOSIS — Z794 Long term (current) use of insulin: Secondary | ICD-10-CM | POA: Diagnosis not present

## 2022-06-19 DIAGNOSIS — M159 Polyosteoarthritis, unspecified: Secondary | ICD-10-CM | POA: Diagnosis not present

## 2022-06-19 DIAGNOSIS — E785 Hyperlipidemia, unspecified: Secondary | ICD-10-CM | POA: Diagnosis not present

## 2022-06-19 DIAGNOSIS — R1310 Dysphagia, unspecified: Secondary | ICD-10-CM | POA: Diagnosis not present

## 2022-06-19 DIAGNOSIS — E1121 Type 2 diabetes mellitus with diabetic nephropathy: Secondary | ICD-10-CM | POA: Diagnosis not present

## 2022-06-19 NOTE — Telephone Encounter (Signed)
Received a voicemail from family that patient was in hospital for her blood sugars and wanted [place] to call to see if there was any suggestions we could give hospital. Tried calling patient back to see if she was still needing assistance. No answer and vm was left.

## 2022-07-03 DIAGNOSIS — R4182 Altered mental status, unspecified: Secondary | ICD-10-CM | POA: Diagnosis not present

## 2022-07-03 DIAGNOSIS — R000 Tachycardia, unspecified: Secondary | ICD-10-CM | POA: Diagnosis not present

## 2022-07-03 DIAGNOSIS — R404 Transient alteration of awareness: Secondary | ICD-10-CM | POA: Diagnosis not present

## 2022-07-03 DIAGNOSIS — R739 Hyperglycemia, unspecified: Secondary | ICD-10-CM | POA: Diagnosis not present

## 2022-07-03 DIAGNOSIS — E1011 Type 1 diabetes mellitus with ketoacidosis with coma: Secondary | ICD-10-CM | POA: Diagnosis not present

## 2022-07-03 DIAGNOSIS — R001 Bradycardia, unspecified: Secondary | ICD-10-CM | POA: Diagnosis not present

## 2022-07-03 DIAGNOSIS — R0689 Other abnormalities of breathing: Secondary | ICD-10-CM | POA: Diagnosis not present

## 2022-07-04 DIAGNOSIS — R000 Tachycardia, unspecified: Secondary | ICD-10-CM | POA: Diagnosis not present

## 2022-07-04 DIAGNOSIS — G9341 Metabolic encephalopathy: Secondary | ICD-10-CM | POA: Diagnosis not present

## 2022-07-04 DIAGNOSIS — E039 Hypothyroidism, unspecified: Secondary | ICD-10-CM | POA: Diagnosis not present

## 2022-07-04 DIAGNOSIS — E1122 Type 2 diabetes mellitus with diabetic chronic kidney disease: Secondary | ICD-10-CM | POA: Diagnosis not present

## 2022-07-04 DIAGNOSIS — E1011 Type 1 diabetes mellitus with ketoacidosis with coma: Secondary | ICD-10-CM | POA: Diagnosis not present

## 2022-07-04 DIAGNOSIS — I2510 Atherosclerotic heart disease of native coronary artery without angina pectoris: Secondary | ICD-10-CM | POA: Diagnosis not present

## 2022-07-04 DIAGNOSIS — E1110 Type 2 diabetes mellitus with ketoacidosis without coma: Secondary | ICD-10-CM | POA: Diagnosis not present

## 2022-07-04 DIAGNOSIS — E860 Dehydration: Secondary | ICD-10-CM | POA: Diagnosis not present

## 2022-07-04 DIAGNOSIS — N184 Chronic kidney disease, stage 4 (severe): Secondary | ICD-10-CM | POA: Diagnosis not present

## 2022-07-04 DIAGNOSIS — I130 Hypertensive heart and chronic kidney disease with heart failure and stage 1 through stage 4 chronic kidney disease, or unspecified chronic kidney disease: Secondary | ICD-10-CM | POA: Diagnosis not present

## 2022-07-04 DIAGNOSIS — R4182 Altered mental status, unspecified: Secondary | ICD-10-CM | POA: Diagnosis not present

## 2022-07-05 DIAGNOSIS — N184 Chronic kidney disease, stage 4 (severe): Secondary | ICD-10-CM | POA: Diagnosis not present

## 2022-07-05 DIAGNOSIS — E1110 Type 2 diabetes mellitus with ketoacidosis without coma: Secondary | ICD-10-CM | POA: Diagnosis not present

## 2022-07-05 DIAGNOSIS — G9341 Metabolic encephalopathy: Secondary | ICD-10-CM | POA: Diagnosis not present

## 2022-07-06 DIAGNOSIS — G9341 Metabolic encephalopathy: Secondary | ICD-10-CM | POA: Diagnosis not present

## 2022-07-06 DIAGNOSIS — N184 Chronic kidney disease, stage 4 (severe): Secondary | ICD-10-CM | POA: Diagnosis not present

## 2022-07-06 DIAGNOSIS — E1110 Type 2 diabetes mellitus with ketoacidosis without coma: Secondary | ICD-10-CM | POA: Diagnosis not present

## 2022-07-07 DIAGNOSIS — N184 Chronic kidney disease, stage 4 (severe): Secondary | ICD-10-CM | POA: Diagnosis not present

## 2022-07-09 ENCOUNTER — Encounter: Payer: Self-pay | Admitting: Endocrinology

## 2022-07-09 ENCOUNTER — Ambulatory Visit (INDEPENDENT_AMBULATORY_CARE_PROVIDER_SITE_OTHER): Payer: Medicare HMO | Admitting: Endocrinology

## 2022-07-09 VITALS — BP 86/72 | HR 87 | Wt 148.6 lb

## 2022-07-09 DIAGNOSIS — E1065 Type 1 diabetes mellitus with hyperglycemia: Secondary | ICD-10-CM

## 2022-07-09 LAB — POCT GLYCOSYLATED HEMOGLOBIN (HGB A1C): Hemoglobin A1C: 7.2 % — AB (ref 4.0–5.6)

## 2022-07-09 NOTE — Patient Instructions (Addendum)
T[address] 16   Take 8 Novolog at supper  Call [facility]

## 2022-07-09 NOTE — Progress Notes (Signed)
Patient ID: Deanna Landry, female   DOB: 26-Mar-1971, 51 y.o.   MRN: [id number]           Reason for Appointment : Follow-up for Type 1 Diabetes  History of Present Illness   Referring HCP:Lee, Keung         Diagnosis: Type 1 diabetes mellitus, date of diagnosis: Age 44         Previous history:   She has been on insulin since her diagnosis when she had significant symptomatic hypoglycemia. Has been on various types of insulin regimens in the past but usually not followed by endocrinologist The last 5 or 6 years she has been on Levemir and NovoLog Previous level of control is variable with A1c only inconsistently below 7  She was on the T-slim pump started on 03/04/21 Settings: Basal rates: 12 AM- 2 AM = 0.60, 2 AM-8 AM = 0.65, 8 AM-12 AM = 0.50 ISF: 1: 60 except from 2-8 AM, mealtime carbohydrate ratio 1:1 Target 120 Active insulin 5 hours   Recent history:   BASAL insulin doses previously: 15 units Tresiba daily at 7 pm MEALTIME insulin: 10 units usually  Non-insulin hypoglycemic drugs: Trulicity 1.5 mg weekly  A1c is 7.2 compared to 6.5  Current management, blood sugar patterns and problems identified:   She is again not able to get the freestyle [name] is started and has not been connected with the DME supplier  She did not call us back also despite giving her the number to call  She says that about a week or so ago she was in ketoacidosis at [facility] but does not know why She says she was having decreased appetite before that but continued on insulin  Overall since her last visit she has lost a significant amount of weight for unknown reasons  She is eating relatively well recently She thinks about a month ago sugars were higher and she went up on her Tresiba by 3 units However FASTING blood sugars are quite variable Most of her sugars after dinner at night are below 100 this week Also does not usually have high readings at lunch or dinner  No recent nausea  vomiting Has dinner around 5 pm  Blood sugars from Accu-Chek review   PRE-MEAL Fasting Lunch Dinner Bedtime Overall  Glucose range: 88-256 156-207 128-142 78-93   Mean/median:     169   POST-MEAL PC Breakfast PC Lunch PC Dinner  Glucose range:   ?  Mean/median:      Previously  PRE-MEAL Fasting Lunch Dinner Bedtime Overall  Glucose range: 133-268 147-260 106-272 110-215   Mean/median:     178   POST-MEAL PC Breakfast PC Lunch PC Dinner  Glucose range:  ?   Mean/median:       Hypoglycemia: As above Symptoms of hypoglycemia:none or minimal Treatment of hypoglycemia: Juices, if patient is unconscious mother will call the ambulance         Self-care: The diet that the patient has been following is: None, does not know carbohydrate counting Immediately skipping breakfast, lunch will be assignments, dinner usually consists of bread or pasta Snacks will be peanut butter crackers, yogurt, cottage cheese, fruit Eating out only about once or twice a month   Mealtimes are: Breakfast none Lunch: 1 pm Dinner: 6pm         Exercise: None          Dietician consultation none.          Wt Readings  from Last 3 Encounters:  07/09/22 148 lb 9.6 oz (67.4 kg)  04/16/22 167 lb (75.8 kg)  02/12/22 172 lb 6.4 oz (78.2 kg)    Diabetes labs:  Lab Results  Component Value Date   HGBA1C 6.5 04/14/2022   HGBA1C 6.9 (H) 12/09/2021   HGBA1C 7.1 (H) 09/02/2021   Lab Results  Component Value Date   MICROALBUR 5.9 (H) 02/05/2022   LDLCALC 57 09/02/2021   CREATININE 2.08 (H) 06/17/2022    Lab Results  Component Value Date   MICRALBCREAT 4.3 02/05/2022     Allergies as of 07/09/2022   No Known Allergies      Medication List        Accurate as of July 09, 2022  2:17 PM. If you have any questions, ask your nurse or doctor.          Accu-Chek Guide test strip Generic drug: glucose blood Use to check blood sugar 5 times daily. Dx E 10.65   acetaminophen 325 MG  tablet Commonly known as: TYLENOL Take 650 mg by mouth every 6 (six) hours as needed for mild pain.   albuterol 108 (90 Base) MCG/ACT inhaler Commonly known as: VENTOLIN HFA Inhale 1-2 puffs into the lungs every 6 (six) hours as needed for wheezing or shortness of breath.   ALPRAZolam 0.25 MG tablet Commonly known as: XANAX Take 0.25 mg by mouth every 6 (six) hours as needed for anxiety or sleep.   aspirin EC 81 MG tablet Take 81 mg by mouth at bedtime.   atorvastatin 80 MG tablet Commonly known as: LIPITOR Take 80 mg by mouth at bedtime.   buPROPion 300 MG 24 hr tablet Commonly known as: WELLBUTRIN XL Take 300 mg by mouth at bedtime.   clopidogrel 75 MG tablet Commonly known as: PLAVIX Take 75 mg by mouth daily.   Dexcom G6 Receiver Devi Continuous glucose monitoring   Dexcom G6 Sensor Misc Apply new sensor every 10 days   Dexcom G6 Transmitter Misc Replace every 3 months   Easy Comfort Pen Needles 31G X 8 MM Misc Generic drug: Insulin Pen Needle Check sugar 3x  daily   Gvoke HypoPen 2-Pack 1 MG/0.2ML Soaj Generic drug: Glucagon Inject 1 mg into the skin as needed. For severe lows What changed: reasons to take this   haloperidol 5 MG tablet Commonly known as: HALDOL Take 5 mg by mouth 2 (two) times daily.   HYDROcodone-acetaminophen 5-325 MG tablet Commonly known as: NORCO/VICODIN Take 1 tablet by mouth every 8 (eight) hours as needed for moderate pain.   insulin degludec 100 UNIT/ML FlexTouch Pen Commonly known as: TRESIBA Inject 14 Units into the skin daily. Adjust as directed   isosorbide dinitrate 20 MG tablet Commonly known as: ISORDIL Take 20 mg by mouth 2 (two) times daily.   levothyroxine 50 MCG tablet Commonly known as: SYNTHROID Take 50 mcg by mouth daily before breakfast.   Lokelma 10 g Pack packet Generic drug: sodium zirconium cyclosilicate Take 10 g by mouth See admin instructions. MON and FRI   loratadine 10 MG tablet Commonly  known as: CLARITIN Take 10 mg by mouth daily as needed for allergies.   methocarbamol 750 MG tablet Commonly known as: Robaxin-750 Take 1 tablet (750 mg total) by mouth every 6 (six) hours as needed for muscle spasms.   metoCLOPramide 5 MG tablet Commonly known as: REGLAN Take 1 tablet (5 mg total) by mouth 4 (four) times daily -  before meals and at bedtime.  metoprolol tartrate 25 MG tablet Commonly known as: LOPRESSOR Take 25 mg by mouth daily.   NovoLOG FlexPen 100 UNIT/ML FlexPen Generic drug: insulin aspart 3-8 UNITS WITH MEALS 3 TIMES A DAY   pantoprazole 40 MG tablet Commonly known as: PROTONIX Take 40 mg by mouth at bedtime.   pregabalin 75 MG capsule Commonly known as: LYRICA Take 75 mg by mouth 2 (two) times daily.   ranolazine 500 MG 12 hr tablet Commonly known as: RANEXA TAKE 1 TABLET BY MOUTH TWICE A DAY   Rocklatan 0.02-0.005 % Soln Generic drug: Netarsudil-Latanoprost Place 1 drop into the left eye at bedtime.   senna-docusate 8.6-50 MG tablet Commonly known as: Senokot-S Take 2 tablets by mouth 2 (two) times daily.   sertraline 100 MG tablet Commonly known as: ZOLOFT Take 100 mg by mouth at bedtime.   Systane 0.4-0.3 % Gel ophthalmic gel Generic drug: Polyethyl Glycol-Propyl Glycol Place 1 application. into both eyes 3 (three) times daily as needed (dry/irritated eyes.).   Trulicity 1.5 IO/2.7OJ Sopn Generic drug: Dulaglutide Inject 1.5 mg into the skin every Monday.        Allergies: No Known Allergies  Past Medical History:  Diagnosis Date   Acute kidney injury superimposed on CKD ([place]) 03/15/2019   Acute respiratory failure with hypoxia ([place]) 03/15/2019   Adjustment disorder with mixed anxiety and depressed mood 03/11/2019   Anemia    Anxiety    Arthritis    right ankle   Blind    right eye   CHF (congestive heart failure) ([place])    Closed right pilon fracture, initial encounter 07/26/2019   COPD (chronic obstructive pulmonary  disease) ([place])    Coronary artery disease    Depression    Diabetes mellitus type 2 in obese ([place]) 07/26/2019   Diabetes mellitus without complication ([place])    type 1   Diabetic polyneuropathy ([place]) 05/04/2016   Dyslipidemia 01/03/2020   Fever 03/10/2019   GERD (gastroesophageal reflux disease)    History of blood transfusion    HLD (hyperlipidemia)    Hyperglycemia    Hypertension    Hypothyroidism    Ileus, postoperative ([place])    Major depression, recurrent ([place]) 03/09/2019   MDD (major depressive disorder), severe ([place]) 03/09/2019   Myocardial infarct, old    2020, s/p DES RCA, RPDA 10/23/19   Neck pain 04/21/2016   Pneumonia 10/2019   Renal disorder    stage 3   Sepsis ([place]) 03/10/2019   Short-term memory loss    mild   Sleep apnea    study done 9/27 and 08/13/20 - no results yet 08/21/20   Stroke ([place])    Suicidal ideation 03/15/2019   Type 1 diabetes mellitus without complication ([place]) 5/00/9381   Wound infection after surgery (Right ankle) 10/04/2019    Past Surgical History:  Procedure Laterality Date   ABDOMINAL HYSTERECTOMY  2014   ANKLE FUSION Right 08/23/2020   Procedure: ANKLE FUSION;  Surgeon: Shona Needles, MD;  Location: [place];  Service: Orthopedics;  Laterality: Right;   APPLICATION OF WOUND VAC Right 10/11/2019   Procedure: WOUND VAC CHANGE TO RIGHT LEG;  Surgeon: Shona Needles, MD;  Location: [place];  Service: Orthopedics;  Laterality: Right;   CORONARY ANGIOPLASTY  10/23/2019   DES RCA, DES RPDA 10/23/19 [facility])   EYE SURGERY     HARDWARE REMOVAL Right 10/06/2019   Procedure: HARDWARE REMOVAL;  Surgeon: Shona Needles, MD;  Location: [place];  Service: Orthopedics;  Laterality:  Right;   HARDWARE REMOVAL Right 08/23/2020   Procedure: HARDWARE REMOVAL;  Surgeon: Shona Needles, MD;  Location: [place];  Service: Orthopedics;  Laterality: Right;   I & D EXTREMITY Right 10/06/2019   Procedure: IRRIGATION AND DEBRIDEMENT EXTREMITY;  Surgeon: Shona Needles, MD;   Location: [place];  Service: Orthopedics;  Laterality: Right;   I & D EXTREMITY Right 10/09/2019   Procedure: IRRIGATION AND DEBRIDEMENT EXTREMITY and WOUND VAC CHANGE RIGHT ANKLE;  Surgeon: Shona Needles, MD;  Location: [place];  Service: Orthopedics;  Laterality: Right;   ORIF ANKLE FRACTURE Right 07/26/2019   Procedure: OPEN REDUCTION INTERNAL FIXATION (ORIF) ANKLE FRACTURE;  Surgeon: Shona Needles, MD;  Location: [place];  Service: Orthopedics;  Laterality: Right;  OPEN REDUCTION INTERNAL FIXATION (ORIF) ANKLE FRACTURE    TOE AMPUTATION      Family History  Problem Relation Age of Onset   Diabetes Mellitus II Mother    Stroke Mother    Heart attack Mother    Stomach cancer Father    Congestive Heart Failure Father    Diabetes Sister    Congestive Heart Failure Maternal Grandfather     Social History:  reports that she has never smoked. She has never used smokeless tobacco. She reports that she does not currently use alcohol. She reports that she does not currently use drugs.      Review of Systems    HYPOTHYROIDISM: Treated by PCP with levothyroxine 50 mcg and all stable doses  Lab Results  Component Value Date   TSH 1.849 06/17/2022   TSH 1.53 04/14/2022   TSH 1.102 03/12/2019   FREET4 1.69 (H) 04/14/2022         Lipids:   She is on a statin drug, currently on 80 mg Lipitor  Lab Results  Component Value Date   CHOL 117 09/02/2021   HDL 46.80 09/02/2021   LDLCALC 57 09/02/2021   TRIG 63.0 09/02/2021   CHOLHDL 2 09/02/2021    Last dilated eye exam was in 3/22  Peripheral neuropathy: She will get at times pains and paresthesia in her legs treated with Lyrica and tramadol.   Also has numbness in her hands  DIABETES COMPLICATIONS: Neuropathy, retinopathy nephropathy   CKD: Labs as follows, regularly seen by nephrologist   Lab Results  Component Value Date   CREATININE 2.08 (H) 06/17/2022   CREATININE 2.05 (H) 12/09/2021   CREATININE 2.42 (H) 09/02/2021    Blood pressure being managed by nephrologist  BP Readings from Last 3 Encounters:  07/09/22 (!) 86/72  06/18/22 (!) 144/66  04/16/22 132/82       Physical Examination:  BP (!) 86/72   Pulse 87   Wt 148 lb 9.6 oz (67.4 kg)   LMP  (LMP Unknown) Comment: hysterectomy 2014  SpO2 96%   BMI 27.18 kg/m   Mood appears to be apathetic  ASSESSMENT:  Diabetes type 1, on basal bolus insulin  A1c is 7.2, previously 6.5 compared to 6.9  See history of present illness for detailed discussion of current diabetes management, blood sugar patterns and problems identified  She is continuing on basal bolus insulin injections Not clear why she has increased insulin requirement lately especially basal despite her weight loss She has still mostly high readings fasting Difficult to assess postprandial readings but blood sugars tend to be close to normal before meals and low normal at bedtime Does not appear to be adjusting her mealtime dose much based on what she  is eating out the blood sugar  HYPOTHYROIDISM: Treated adequately with the 50 mcg dose Synthroid  PLAN:   Given phone number for DME supplier and will have them fax order to [place] We will try to get her the freestyle libre 3 with the reader for better accuracy and consistent monitoring She needs to try and adjust her mealtime doses more consistently based on total number of carbohydrates and extra for higher fat For average meals at dinnertime she can take 8 units and if eating more than 2 starches can go up to 10 or 12 Go up at least 1 unit on the [place] and continue to increase if fasting readings are consistently high Also discussed how to add extra insulin for high sugars but no more than 2 or 3 units if blood sugars are over 250 especially overnight  Recommended that she follow-up with her PCP regarding weight loss and likely depression  Follow-up in 3 months  Patient Instructions  Tresiba 16   Take 8 Novolog at  supper  Total visit time including counseling time = 30 minutes  Elayne Snare 07/09/2022, 2:17 PM      Note: This note was prepared with Dragon voice recognition system technology. Any transcriptional errors that result from this process are unintentional.

## 2022-07-17 DIAGNOSIS — G4733 Obstructive sleep apnea (adult) (pediatric): Secondary | ICD-10-CM | POA: Diagnosis not present

## 2022-07-17 DIAGNOSIS — R0789 Other chest pain: Secondary | ICD-10-CM | POA: Diagnosis not present

## 2022-07-17 DIAGNOSIS — E1143 Type 2 diabetes mellitus with diabetic autonomic (poly)neuropathy: Secondary | ICD-10-CM | POA: Diagnosis not present

## 2022-07-17 DIAGNOSIS — I10 Essential (primary) hypertension: Secondary | ICD-10-CM | POA: Diagnosis not present

## 2022-07-17 DIAGNOSIS — N189 Chronic kidney disease, unspecified: Secondary | ICD-10-CM | POA: Diagnosis not present

## 2022-07-17 DIAGNOSIS — K3184 Gastroparesis: Secondary | ICD-10-CM | POA: Diagnosis not present

## 2022-07-17 DIAGNOSIS — I679 Cerebrovascular disease, unspecified: Secondary | ICD-10-CM | POA: Diagnosis not present

## 2022-07-17 DIAGNOSIS — E1121 Type 2 diabetes mellitus with diabetic nephropathy: Secondary | ICD-10-CM | POA: Diagnosis not present

## 2022-07-17 DIAGNOSIS — R5382 Chronic fatigue, unspecified: Secondary | ICD-10-CM | POA: Diagnosis not present

## 2022-07-17 DIAGNOSIS — R000 Tachycardia, unspecified: Secondary | ICD-10-CM | POA: Diagnosis not present

## 2022-07-17 DIAGNOSIS — R071 Chest pain on breathing: Secondary | ICD-10-CM | POA: Diagnosis not present

## 2022-07-17 DIAGNOSIS — R079 Chest pain, unspecified: Secondary | ICD-10-CM | POA: Diagnosis not present

## 2022-07-17 DIAGNOSIS — J449 Chronic obstructive pulmonary disease, unspecified: Secondary | ICD-10-CM | POA: Diagnosis not present

## 2022-07-17 DIAGNOSIS — E039 Hypothyroidism, unspecified: Secondary | ICD-10-CM | POA: Diagnosis not present

## 2022-07-17 DIAGNOSIS — D649 Anemia, unspecified: Secondary | ICD-10-CM | POA: Diagnosis not present

## 2022-07-18 DIAGNOSIS — L03032 Cellulitis of left toe: Secondary | ICD-10-CM | POA: Diagnosis not present

## 2022-07-18 DIAGNOSIS — N189 Chronic kidney disease, unspecified: Secondary | ICD-10-CM | POA: Diagnosis not present

## 2022-07-18 DIAGNOSIS — N184 Chronic kidney disease, stage 4 (severe): Secondary | ICD-10-CM | POA: Diagnosis not present

## 2022-07-18 DIAGNOSIS — I130 Hypertensive heart and chronic kidney disease with heart failure and stage 1 through stage 4 chronic kidney disease, or unspecified chronic kidney disease: Secondary | ICD-10-CM | POA: Diagnosis not present

## 2022-07-18 DIAGNOSIS — I25119 Atherosclerotic heart disease of native coronary artery with unspecified angina pectoris: Secondary | ICD-10-CM | POA: Diagnosis not present

## 2022-07-18 DIAGNOSIS — J9811 Atelectasis: Secondary | ICD-10-CM | POA: Diagnosis not present

## 2022-07-18 DIAGNOSIS — I959 Hypotension, unspecified: Secondary | ICD-10-CM | POA: Diagnosis not present

## 2022-07-18 DIAGNOSIS — I5032 Chronic diastolic (congestive) heart failure: Secondary | ICD-10-CM | POA: Diagnosis not present

## 2022-07-18 DIAGNOSIS — I517 Cardiomegaly: Secondary | ICD-10-CM | POA: Diagnosis not present

## 2022-07-18 DIAGNOSIS — N179 Acute kidney failure, unspecified: Secondary | ICD-10-CM | POA: Diagnosis not present

## 2022-07-18 DIAGNOSIS — E860 Dehydration: Secondary | ICD-10-CM | POA: Diagnosis not present

## 2022-07-18 DIAGNOSIS — R079 Chest pain, unspecified: Secondary | ICD-10-CM | POA: Diagnosis not present

## 2022-07-19 DIAGNOSIS — R079 Chest pain, unspecified: Secondary | ICD-10-CM | POA: Diagnosis not present

## 2022-07-19 DIAGNOSIS — I517 Cardiomegaly: Secondary | ICD-10-CM

## 2022-07-29 DIAGNOSIS — E039 Hypothyroidism, unspecified: Secondary | ICD-10-CM | POA: Diagnosis not present

## 2022-07-29 DIAGNOSIS — I679 Cerebrovascular disease, unspecified: Secondary | ICD-10-CM | POA: Diagnosis not present

## 2022-07-29 DIAGNOSIS — D649 Anemia, unspecified: Secondary | ICD-10-CM | POA: Diagnosis not present

## 2022-07-29 DIAGNOSIS — J449 Chronic obstructive pulmonary disease, unspecified: Secondary | ICD-10-CM | POA: Diagnosis not present

## 2022-07-29 DIAGNOSIS — G4733 Obstructive sleep apnea (adult) (pediatric): Secondary | ICD-10-CM | POA: Diagnosis not present

## 2022-07-29 DIAGNOSIS — F419 Anxiety disorder, unspecified: Secondary | ICD-10-CM | POA: Diagnosis not present

## 2022-07-29 DIAGNOSIS — K3184 Gastroparesis: Secondary | ICD-10-CM | POA: Diagnosis not present

## 2022-07-29 DIAGNOSIS — I10 Essential (primary) hypertension: Secondary | ICD-10-CM | POA: Diagnosis not present

## 2022-07-29 DIAGNOSIS — E1143 Type 2 diabetes mellitus with diabetic autonomic (poly)neuropathy: Secondary | ICD-10-CM | POA: Diagnosis not present

## 2022-08-05 DIAGNOSIS — J449 Chronic obstructive pulmonary disease, unspecified: Secondary | ICD-10-CM | POA: Diagnosis not present

## 2022-08-05 DIAGNOSIS — F419 Anxiety disorder, unspecified: Secondary | ICD-10-CM | POA: Diagnosis not present

## 2022-08-05 DIAGNOSIS — I679 Cerebrovascular disease, unspecified: Secondary | ICD-10-CM | POA: Diagnosis not present

## 2022-08-05 DIAGNOSIS — E1143 Type 2 diabetes mellitus with diabetic autonomic (poly)neuropathy: Secondary | ICD-10-CM | POA: Diagnosis not present

## 2022-08-05 DIAGNOSIS — D649 Anemia, unspecified: Secondary | ICD-10-CM | POA: Diagnosis not present

## 2022-08-05 DIAGNOSIS — E039 Hypothyroidism, unspecified: Secondary | ICD-10-CM | POA: Diagnosis not present

## 2022-08-05 DIAGNOSIS — G4733 Obstructive sleep apnea (adult) (pediatric): Secondary | ICD-10-CM | POA: Diagnosis not present

## 2022-08-05 DIAGNOSIS — I10 Essential (primary) hypertension: Secondary | ICD-10-CM | POA: Diagnosis not present

## 2022-08-05 DIAGNOSIS — K3184 Gastroparesis: Secondary | ICD-10-CM | POA: Diagnosis not present

## 2022-08-10 DIAGNOSIS — E1165 Type 2 diabetes mellitus with hyperglycemia: Secondary | ICD-10-CM | POA: Diagnosis not present

## 2022-08-19 DIAGNOSIS — E1143 Type 2 diabetes mellitus with diabetic autonomic (poly)neuropathy: Secondary | ICD-10-CM | POA: Diagnosis not present

## 2022-08-19 DIAGNOSIS — J449 Chronic obstructive pulmonary disease, unspecified: Secondary | ICD-10-CM | POA: Diagnosis not present

## 2022-08-19 DIAGNOSIS — K3184 Gastroparesis: Secondary | ICD-10-CM | POA: Diagnosis not present

## 2022-08-19 DIAGNOSIS — G4733 Obstructive sleep apnea (adult) (pediatric): Secondary | ICD-10-CM | POA: Diagnosis not present

## 2022-08-19 DIAGNOSIS — I10 Essential (primary) hypertension: Secondary | ICD-10-CM | POA: Diagnosis not present

## 2022-08-19 DIAGNOSIS — F419 Anxiety disorder, unspecified: Secondary | ICD-10-CM | POA: Diagnosis not present

## 2022-08-19 DIAGNOSIS — E039 Hypothyroidism, unspecified: Secondary | ICD-10-CM | POA: Diagnosis not present

## 2022-08-19 DIAGNOSIS — D649 Anemia, unspecified: Secondary | ICD-10-CM | POA: Diagnosis not present

## 2022-08-19 DIAGNOSIS — I679 Cerebrovascular disease, unspecified: Secondary | ICD-10-CM | POA: Diagnosis not present

## 2022-08-29 DIAGNOSIS — H402223 Chronic angle-closure glaucoma, left eye, severe stage: Secondary | ICD-10-CM | POA: Diagnosis not present

## 2022-09-02 DIAGNOSIS — J449 Chronic obstructive pulmonary disease, unspecified: Secondary | ICD-10-CM | POA: Diagnosis not present

## 2022-09-02 DIAGNOSIS — I679 Cerebrovascular disease, unspecified: Secondary | ICD-10-CM | POA: Diagnosis not present

## 2022-09-02 DIAGNOSIS — K3184 Gastroparesis: Secondary | ICD-10-CM | POA: Diagnosis not present

## 2022-09-02 DIAGNOSIS — E1143 Type 2 diabetes mellitus with diabetic autonomic (poly)neuropathy: Secondary | ICD-10-CM | POA: Diagnosis not present

## 2022-09-02 DIAGNOSIS — F419 Anxiety disorder, unspecified: Secondary | ICD-10-CM | POA: Diagnosis not present

## 2022-09-02 DIAGNOSIS — D649 Anemia, unspecified: Secondary | ICD-10-CM | POA: Diagnosis not present

## 2022-09-02 DIAGNOSIS — E039 Hypothyroidism, unspecified: Secondary | ICD-10-CM | POA: Diagnosis not present

## 2022-09-02 DIAGNOSIS — G4733 Obstructive sleep apnea (adult) (pediatric): Secondary | ICD-10-CM | POA: Diagnosis not present

## 2022-09-02 DIAGNOSIS — I10 Essential (primary) hypertension: Secondary | ICD-10-CM | POA: Diagnosis not present

## 2022-09-11 DIAGNOSIS — F32A Depression, unspecified: Secondary | ICD-10-CM | POA: Diagnosis not present

## 2022-09-16 DIAGNOSIS — I10 Essential (primary) hypertension: Secondary | ICD-10-CM | POA: Diagnosis not present

## 2022-09-16 DIAGNOSIS — K3184 Gastroparesis: Secondary | ICD-10-CM | POA: Diagnosis not present

## 2022-09-16 DIAGNOSIS — E1143 Type 2 diabetes mellitus with diabetic autonomic (poly)neuropathy: Secondary | ICD-10-CM | POA: Diagnosis not present

## 2022-09-16 DIAGNOSIS — D649 Anemia, unspecified: Secondary | ICD-10-CM | POA: Diagnosis not present

## 2022-09-16 DIAGNOSIS — I679 Cerebrovascular disease, unspecified: Secondary | ICD-10-CM | POA: Diagnosis not present

## 2022-09-16 DIAGNOSIS — Z23 Encounter for immunization: Secondary | ICD-10-CM | POA: Diagnosis not present

## 2022-09-16 DIAGNOSIS — J449 Chronic obstructive pulmonary disease, unspecified: Secondary | ICD-10-CM | POA: Diagnosis not present

## 2022-09-16 DIAGNOSIS — E039 Hypothyroidism, unspecified: Secondary | ICD-10-CM | POA: Diagnosis not present

## 2022-09-16 DIAGNOSIS — G4733 Obstructive sleep apnea (adult) (pediatric): Secondary | ICD-10-CM | POA: Diagnosis not present

## 2022-09-17 DIAGNOSIS — F32A Depression, unspecified: Secondary | ICD-10-CM | POA: Diagnosis not present

## 2022-09-21 DIAGNOSIS — I129 Hypertensive chronic kidney disease with stage 1 through stage 4 chronic kidney disease, or unspecified chronic kidney disease: Secondary | ICD-10-CM | POA: Diagnosis not present

## 2022-09-21 DIAGNOSIS — E109 Type 1 diabetes mellitus without complications: Secondary | ICD-10-CM | POA: Diagnosis not present

## 2022-09-21 DIAGNOSIS — N390 Urinary tract infection, site not specified: Secondary | ICD-10-CM | POA: Diagnosis not present

## 2022-09-21 DIAGNOSIS — D631 Anemia in chronic kidney disease: Secondary | ICD-10-CM | POA: Diagnosis not present

## 2022-09-21 DIAGNOSIS — R809 Proteinuria, unspecified: Secondary | ICD-10-CM | POA: Diagnosis not present

## 2022-09-21 DIAGNOSIS — N184 Chronic kidney disease, stage 4 (severe): Secondary | ICD-10-CM | POA: Diagnosis not present

## 2022-09-21 DIAGNOSIS — N2581 Secondary hyperparathyroidism of renal origin: Secondary | ICD-10-CM | POA: Diagnosis not present

## 2022-09-21 DIAGNOSIS — E875 Hyperkalemia: Secondary | ICD-10-CM | POA: Diagnosis not present

## 2022-10-01 DIAGNOSIS — E1143 Type 2 diabetes mellitus with diabetic autonomic (poly)neuropathy: Secondary | ICD-10-CM | POA: Diagnosis not present

## 2022-10-01 DIAGNOSIS — I10 Essential (primary) hypertension: Secondary | ICD-10-CM | POA: Diagnosis not present

## 2022-10-01 DIAGNOSIS — Z794 Long term (current) use of insulin: Secondary | ICD-10-CM | POA: Diagnosis not present

## 2022-10-01 DIAGNOSIS — E039 Hypothyroidism, unspecified: Secondary | ICD-10-CM | POA: Diagnosis not present

## 2022-10-01 DIAGNOSIS — D649 Anemia, unspecified: Secondary | ICD-10-CM | POA: Diagnosis not present

## 2022-10-01 DIAGNOSIS — G4733 Obstructive sleep apnea (adult) (pediatric): Secondary | ICD-10-CM | POA: Diagnosis not present

## 2022-10-01 DIAGNOSIS — E1121 Type 2 diabetes mellitus with diabetic nephropathy: Secondary | ICD-10-CM | POA: Diagnosis not present

## 2022-10-01 DIAGNOSIS — J449 Chronic obstructive pulmonary disease, unspecified: Secondary | ICD-10-CM | POA: Diagnosis not present

## 2022-10-01 DIAGNOSIS — E785 Hyperlipidemia, unspecified: Secondary | ICD-10-CM | POA: Diagnosis not present

## 2022-10-01 DIAGNOSIS — I679 Cerebrovascular disease, unspecified: Secondary | ICD-10-CM | POA: Diagnosis not present

## 2022-11-02 ENCOUNTER — Ambulatory Visit: Payer: Medicare HMO | Admitting: Endocrinology

## 2022-11-03 DIAGNOSIS — E1165 Type 2 diabetes mellitus with hyperglycemia: Secondary | ICD-10-CM | POA: Diagnosis not present

## 2022-11-05 DIAGNOSIS — F32A Depression, unspecified: Secondary | ICD-10-CM | POA: Diagnosis not present

## 2022-11-06 DIAGNOSIS — I679 Cerebrovascular disease, unspecified: Secondary | ICD-10-CM | POA: Diagnosis not present

## 2022-11-06 DIAGNOSIS — J449 Chronic obstructive pulmonary disease, unspecified: Secondary | ICD-10-CM | POA: Diagnosis not present

## 2022-11-06 DIAGNOSIS — Z794 Long term (current) use of insulin: Secondary | ICD-10-CM | POA: Diagnosis not present

## 2022-11-06 DIAGNOSIS — I10 Essential (primary) hypertension: Secondary | ICD-10-CM | POA: Diagnosis not present

## 2022-11-06 DIAGNOSIS — E1121 Type 2 diabetes mellitus with diabetic nephropathy: Secondary | ICD-10-CM | POA: Diagnosis not present

## 2022-11-06 DIAGNOSIS — D649 Anemia, unspecified: Secondary | ICD-10-CM | POA: Diagnosis not present

## 2022-11-06 DIAGNOSIS — F29 Unspecified psychosis not due to a substance or known physiological condition: Secondary | ICD-10-CM | POA: Diagnosis not present

## 2022-11-06 DIAGNOSIS — E039 Hypothyroidism, unspecified: Secondary | ICD-10-CM | POA: Diagnosis not present

## 2022-11-06 DIAGNOSIS — G4733 Obstructive sleep apnea (adult) (pediatric): Secondary | ICD-10-CM | POA: Diagnosis not present

## 2022-11-30 ENCOUNTER — Other Ambulatory Visit: Payer: Self-pay | Admitting: Endocrinology

## 2022-11-30 DIAGNOSIS — E1065 Type 1 diabetes mellitus with hyperglycemia: Secondary | ICD-10-CM

## 2022-12-04 DIAGNOSIS — Z794 Long term (current) use of insulin: Secondary | ICD-10-CM | POA: Diagnosis not present

## 2022-12-04 DIAGNOSIS — D649 Anemia, unspecified: Secondary | ICD-10-CM | POA: Diagnosis not present

## 2022-12-04 DIAGNOSIS — E1121 Type 2 diabetes mellitus with diabetic nephropathy: Secondary | ICD-10-CM | POA: Diagnosis not present

## 2022-12-04 DIAGNOSIS — I679 Cerebrovascular disease, unspecified: Secondary | ICD-10-CM | POA: Diagnosis not present

## 2022-12-04 DIAGNOSIS — F29 Unspecified psychosis not due to a substance or known physiological condition: Secondary | ICD-10-CM | POA: Diagnosis not present

## 2022-12-04 DIAGNOSIS — I10 Essential (primary) hypertension: Secondary | ICD-10-CM | POA: Diagnosis not present

## 2022-12-04 DIAGNOSIS — J449 Chronic obstructive pulmonary disease, unspecified: Secondary | ICD-10-CM | POA: Diagnosis not present

## 2022-12-04 DIAGNOSIS — E039 Hypothyroidism, unspecified: Secondary | ICD-10-CM | POA: Diagnosis not present

## 2022-12-04 DIAGNOSIS — G4733 Obstructive sleep apnea (adult) (pediatric): Secondary | ICD-10-CM | POA: Diagnosis not present

## 2022-12-07 ENCOUNTER — Other Ambulatory Visit: Payer: Self-pay | Admitting: Endocrinology

## 2022-12-07 DIAGNOSIS — E1065 Type 1 diabetes mellitus with hyperglycemia: Secondary | ICD-10-CM

## 2022-12-18 DIAGNOSIS — I10 Essential (primary) hypertension: Secondary | ICD-10-CM | POA: Diagnosis not present

## 2022-12-18 DIAGNOSIS — J449 Chronic obstructive pulmonary disease, unspecified: Secondary | ICD-10-CM | POA: Diagnosis not present

## 2022-12-18 DIAGNOSIS — F29 Unspecified psychosis not due to a substance or known physiological condition: Secondary | ICD-10-CM | POA: Diagnosis not present

## 2022-12-18 DIAGNOSIS — E1121 Type 2 diabetes mellitus with diabetic nephropathy: Secondary | ICD-10-CM | POA: Diagnosis not present

## 2022-12-18 DIAGNOSIS — Z794 Long term (current) use of insulin: Secondary | ICD-10-CM | POA: Diagnosis not present

## 2022-12-18 DIAGNOSIS — D649 Anemia, unspecified: Secondary | ICD-10-CM | POA: Diagnosis not present

## 2022-12-18 DIAGNOSIS — I679 Cerebrovascular disease, unspecified: Secondary | ICD-10-CM | POA: Diagnosis not present

## 2022-12-18 DIAGNOSIS — E039 Hypothyroidism, unspecified: Secondary | ICD-10-CM | POA: Diagnosis not present

## 2022-12-18 DIAGNOSIS — G4733 Obstructive sleep apnea (adult) (pediatric): Secondary | ICD-10-CM | POA: Diagnosis not present

## 2022-12-21 ENCOUNTER — Ambulatory Visit (INDEPENDENT_AMBULATORY_CARE_PROVIDER_SITE_OTHER): Payer: Medicare HMO | Admitting: Endocrinology

## 2022-12-21 ENCOUNTER — Encounter: Payer: Self-pay | Admitting: Endocrinology

## 2022-12-21 VITALS — BP 130/82 | HR 100 | Ht 62.0 in | Wt 159.0 lb

## 2022-12-21 DIAGNOSIS — E1065 Type 1 diabetes mellitus with hyperglycemia: Secondary | ICD-10-CM | POA: Diagnosis not present

## 2022-12-21 LAB — POCT GLYCOSYLATED HEMOGLOBIN (HGB A1C): Hemoglobin A1C: 6 % — AB (ref 4.0–5.6)

## 2022-12-21 NOTE — Patient Instructions (Signed)
Tresiba 15

## 2022-12-21 NOTE — Progress Notes (Signed)
Patient ID: Deanna Landry, female   DOB: 1971-01-22, 52 y.o.   MRN: [id number]           Reason for Appointment : Follow-up for Type 1 Diabetes  History of Present Illness   Referring HCP:Lee, Keung         Diagnosis: Type 1 diabetes mellitus, date of diagnosis: Age 64         Previous history:   She has been on insulin since her diagnosis when she had significant symptomatic hypoglycemia. Has been on various types of insulin regimens in the past but usually not followed by endocrinologist The last 5 or 6 years she has been on Levemir and NovoLog Previous level of control is variable with A1c only inconsistently below 7  She was on the T-slim pump started on 03/04/21 Settings: Basal rates: 12 AM- 2 AM = 0.60, 2 AM-8 AM = 0.65, 8 AM-12 AM = 0.50 ISF: 1: 60 except from 2-8 AM, mealtime carbohydrate ratio 1:1 Target 120 Active insulin 5 hours   Recent history:   BASAL insulin doses previously: 18 units Tresiba daily at 7 pm MEALTIME insulin: 10 units usually  Non-insulin hypoglycemic drugs: Trulicity 1.5 mg weekly   A1c is 6%, was 7.2  She has not been seen since 06/2022  Current management, blood sugar patterns and problems identified:   She is finally able to get the freestyle [name] is started but has taken been using this only for the last 2 weeks because she was waiting for replacement sensors However her date is programmed incorrectly Not clear why she has gone up on her Tresiba from 15 up to 18, she thinks this is because of eating more However she is frequently getting low blood sugars overnight and periodically also before dinner Currently she does not feel comfortable counting carbohydrates and is taking a flat dose of 10 units with every meal  This appears to be excessive at least in the morning when she is generally eating a relatively higher protein or lower carbohydrate type of meal, today had a sausage biscuit She also has periodic low sugars after dinner Also after  low blood sugars she will have excessive rebound likely from overtreating a low sugar Generally eating 2 meals a day    Mealtimes are: Breakfast none usually, lunch: 12-1 pm Dinner: 6pm         Libre 3 was downloaded and interpretation of the last 2 weeks data is as follows  High variability is present especially overnight and late evening OVERNIGHT blood sugars show frequent episodes of hypoglycemia mostly around 12-3 AM but also occasionally periods of hyperglycemia early part of the night PREMEAL blood sugars at lunchtime are variable but averaging about 140+ and similar at dinnertime with variability POSTPRANDIAL readings are generally lower than Premeal readings including some hypoglycemia both after lunch and dinner Hyperglycemia after meals is seen only occasionally in the afternoons Some of her hyperglycemia is preceded by low sugars at different times  CGM use % of time 97  2-week average/GV 145/47  Time in range       69 %  % Time Above 180 15  % Time above 250 8  % Time Below 70 8     PRE-MEAL Fasting Lunch Dinner Bedtime Overall  Glucose range:       Averages:        POST-MEAL PC Breakfast PC Lunch PC Dinner  Glucose range:     Averages:  Previously  PRE-MEAL Fasting Lunch Dinner Bedtime Overall  Glucose range: 88-256 156-207 128-142 78-93   Mean/median:     169   POST-MEAL PC Breakfast PC Lunch PC Dinner  Glucose range:   ?  Mean/median:      Hypoglycemia: As above Symptoms of hypoglycemia:none or minimal Treatment of hypoglycemia: Juices, food     Self-care:  Snacks will be peanut butter crackers, yogurt, cottage cheese, fruit Eating out only about once or twice a month Exercise: None          Dietician consultation none.          Wt Readings from Last 3 Encounters:  12/21/22 159 lb (72.1 kg)  07/09/22 148 lb 9.6 oz (67.4 kg)  04/16/22 167 lb (75.8 kg)    Diabetes labs:  Lab Results  Component Value Date   HGBA1C 6.0 (A) 12/21/2022    HGBA1C 7.2 (A) 07/09/2022   HGBA1C 6.5 04/14/2022   Lab Results  Component Value Date   MICROALBUR 5.9 (H) 02/05/2022   LDLCALC 57 09/02/2021   CREATININE 2.08 (H) 06/17/2022    Lab Results  Component Value Date   MICRALBCREAT 4.3 02/05/2022     Allergies as of 12/21/2022   No Known Allergies      Medication List        Accurate as of December 21, 2022  4:50 PM. If you have any questions, ask your nurse or doctor.          STOP taking these medications    Dexcom G6 Receiver Devi Stopped by: Elayne Snare, MD   Dexcom G6 Sensor Misc Stopped by: Elayne Snare, MD   Dexcom G6 Transmitter Misc Stopped by: Elayne Snare, MD       TAKE these medications    Accu-Chek Guide test strip Generic drug: glucose blood Use to check blood sugar 5 times daily. Dx E 10.65   acetaminophen 325 MG tablet Commonly known as: TYLENOL Take 650 mg by mouth every 6 (six) hours as needed for mild pain.   albuterol 108 (90 Base) MCG/ACT inhaler Commonly known as: VENTOLIN HFA Inhale 1-2 puffs into the lungs every 6 (six) hours as needed for wheezing or shortness of breath.   ALPRAZolam 0.25 MG tablet Commonly known as: XANAX Take 0.25 mg by mouth every 6 (six) hours as needed for anxiety or sleep.   amLODipine 2.5 MG tablet Commonly known as: NORVASC Take 2.5 mg by mouth daily.   aspirin EC 81 MG tablet Take 81 mg by mouth at bedtime.   atorvastatin 80 MG tablet Commonly known as: LIPITOR Take 80 mg by mouth at bedtime.   buPROPion 300 MG 24 hr tablet Commonly known as: WELLBUTRIN XL Take 300 mg by mouth at bedtime.   clopidogrel 75 MG tablet Commonly known as: PLAVIX Take 75 mg by mouth daily.   Easy Comfort Pen Needles 31G X 8 MM Misc Generic drug: Insulin Pen Needle Check sugar 3x  daily   Gvoke HypoPen 2-Pack 1 MG/0.2ML Soaj Generic drug: Glucagon Inject 1 mg into the skin as needed. For severe lows What changed: reasons to take this   haloperidol 5 MG  tablet Commonly known as: HALDOL Take 5 mg by mouth 2 (two) times daily.   HYDROcodone-acetaminophen 5-325 MG tablet Commonly known as: NORCO/VICODIN Take 1 tablet by mouth every 8 (eight) hours as needed for moderate pain.   isosorbide dinitrate 20 MG tablet Commonly known as: ISORDIL Take 20 mg by mouth 2 (two) times  daily.   levothyroxine 50 MCG tablet Commonly known as: SYNTHROID Take 50 mcg by mouth daily before breakfast.   Lokelma 10 g Pack packet Generic drug: sodium zirconium cyclosilicate Take 10 g by mouth See admin instructions. MON and FRI   loratadine 10 MG tablet Commonly known as: CLARITIN Take 10 mg by mouth daily as needed for allergies.   losartan 25 MG tablet Commonly known as: COZAAR Take 12.5 mg by mouth daily.   methocarbamol 750 MG tablet Commonly known as: Robaxin-750 Take 1 tablet (750 mg total) by mouth every 6 (six) hours as needed for muscle spasms.   metoCLOPramide 5 MG tablet Commonly known as: REGLAN Take 1 tablet (5 mg total) by mouth 4 (four) times daily -  before meals and at bedtime.   metoprolol tartrate 25 MG tablet Commonly known as: LOPRESSOR Take 25 mg by mouth daily.   NovoLOG FlexPen 100 UNIT/ML FlexPen Generic drug: insulin aspart USE UP TO 15 UNITS BEFORE MEALS THREE TIMES DAILY   NovoLOG 100 UNIT/ML injection Generic drug: insulin aspart USE MAXIMUM OF 65 UNITS DAILY IN INSULIN PUMP   pantoprazole 40 MG tablet Commonly known as: PROTONIX Take 40 mg by mouth at bedtime.   pregabalin 75 MG capsule Commonly known as: LYRICA Take 75 mg by mouth 2 (two) times daily.   ranolazine 500 MG 12 hr tablet Commonly known as: RANEXA TAKE 1 TABLET BY MOUTH TWICE A DAY   Rocklatan 0.02-0.005 % Soln Generic drug: Netarsudil-Latanoprost Place 1 drop into the left eye at bedtime.   senna-docusate 8.6-50 MG tablet Commonly known as: Senokot-S Take 2 tablets by mouth 2 (two) times daily.   sertraline 100 MG tablet Commonly  known as: ZOLOFT Take 100 mg by mouth at bedtime.   Systane 0.4-0.3 % Gel ophthalmic gel Generic drug: Polyethyl Glycol-Propyl Glycol Place 1 application. into both eyes 3 (three) times daily as needed (dry/irritated eyes.).   [name] FlexTouch 100 UNIT/ML FlexTouch Pen Generic drug: insulin degludec INJECT 14 UNITS INTO THE SKIN DAILY. ADJUST AS DIRECTED   Trulicity 1.5 SW/9.6PR Sopn Generic drug: Dulaglutide Inject 1.5 mg into the skin every Monday.   Veltassa 8.4 g packet Generic drug: patiromer Take 8.4 g by mouth every other day.   Vyzulta 0.024 % Soln Generic drug: Latanoprostene Bunod Apply 1 drop to eye at bedtime.        Allergies: No Known Allergies  Past Medical History:  Diagnosis Date   Acute kidney injury superimposed on CKD ([place]) 03/15/2019   Acute respiratory failure with hypoxia ([place]) 03/15/2019   Adjustment disorder with mixed anxiety and depressed mood 03/11/2019   Anemia    Anxiety    Arthritis    right ankle   Blind    right eye   CHF (congestive heart failure) ([place])    Closed right pilon fracture, initial encounter 07/26/2019   COPD (chronic obstructive pulmonary disease) ([facility])    Coronary artery disease    Depression    Diabetes mellitus type 2 in obese ([place]) 07/26/2019   Diabetes mellitus without complication ([place])    type 1   Diabetic polyneuropathy ([place]) 05/04/2016   Dyslipidemia 01/03/2020   Fever 03/10/2019   GERD (gastroesophageal reflux disease)    History of blood transfusion    HLD (hyperlipidemia)    Hyperglycemia    Hypertension    Hypothyroidism    Ileus, postoperative ([place])    Major depression, recurrent ([place]) 03/09/2019   MDD (major depressive disorder), severe ([place]) 03/09/2019  Myocardial infarct, old    2020, s/p DES RCA, RPDA 10/23/19   Neck pain 04/21/2016   Pneumonia 10/2019   Renal disorder    stage 3   Sepsis ([place]) 03/10/2019   Short-term memory loss    mild   Sleep apnea    study done 9/27 and 08/13/20 - no results yet  08/21/20   Stroke ([place])    Suicidal ideation 03/15/2019   Type 1 diabetes mellitus without complication ([place]) 5/95/6387   Wound infection after surgery (Right ankle) 10/04/2019    Past Surgical History:  Procedure Laterality Date   ABDOMINAL HYSTERECTOMY  2014   ANKLE FUSION Right 08/23/2020   Procedure: ANKLE FUSION;  Surgeon: Shona Needles, MD;  Location: [place];  Service: Orthopedics;  Laterality: Right;   APPLICATION OF WOUND VAC Right 10/11/2019   Procedure: WOUND VAC CHANGE TO RIGHT LEG;  Surgeon: Shona Needles, MD;  Location: [place];  Service: Orthopedics;  Laterality: Right;   CORONARY ANGIOPLASTY  10/23/2019   DES RCA, DES RPDA 10/23/19 [facility])   EYE SURGERY     HARDWARE REMOVAL Right 10/06/2019   Procedure: HARDWARE REMOVAL;  Surgeon: Shona Needles, MD;  Location: [place];  Service: Orthopedics;  Laterality: Right;   HARDWARE REMOVAL Right 08/23/2020   Procedure: HARDWARE REMOVAL;  Surgeon: Shona Needles, MD;  Location: [place];  Service: Orthopedics;  Laterality: Right;   I & D EXTREMITY Right 10/06/2019   Procedure: IRRIGATION AND DEBRIDEMENT EXTREMITY;  Surgeon: Shona Needles, MD;  Location: [place];  Service: Orthopedics;  Laterality: Right;   I & D EXTREMITY Right 10/09/2019   Procedure: IRRIGATION AND DEBRIDEMENT EXTREMITY and WOUND VAC CHANGE RIGHT ANKLE;  Surgeon: Shona Needles, MD;  Location: [place];  Service: Orthopedics;  Laterality: Right;   ORIF ANKLE FRACTURE Right 07/26/2019   Procedure: OPEN REDUCTION INTERNAL FIXATION (ORIF) ANKLE FRACTURE;  Surgeon: Shona Needles, MD;  Location: [place];  Service: Orthopedics;  Laterality: Right;  OPEN REDUCTION INTERNAL FIXATION (ORIF) ANKLE FRACTURE    TOE AMPUTATION      Family History  Problem Relation Age of Onset   Diabetes Mellitus II Mother    Stroke Mother    Heart attack Mother    Stomach cancer Father    Congestive Heart Failure Father    Diabetes Sister    Congestive Heart Failure Maternal Grandfather      Social History:  reports that she has never smoked. She has never used smokeless tobacco. She reports that she does not currently use alcohol. She reports that she does not currently use drugs.      Review of Systems    HYPOTHYROIDISM: Treated by PCP with levothyroxine 50 mcg and all stable doses  Lab Results  Component Value Date   TSH 1.849 06/17/2022   TSH 1.53 04/14/2022   TSH 1.102 03/12/2019   FREET4 1.69 (H) 04/14/2022         Lipids:   She is on a statin drug, currently on 80 mg Lipitor  Lab Results  Component Value Date   CHOL 117 09/02/2021   HDL 46.80 09/02/2021   LDLCALC 57 09/02/2021   TRIG 63.0 09/02/2021   CHOLHDL 2 09/02/2021    Last dilated eye exam was in 3/22  Peripheral neuropathy: She will get at times pains and paresthesia in her legs treated with Lyrica and tramadol.   Also has numbness in her hands  DIABETES COMPLICATIONS: Neuropathy, retinopathy nephropathy   CKD:  Labs as follows, regularly seen by nephrologist   Lab Results  Component Value Date   CREATININE 2.08 (H) 06/17/2022   CREATININE 2.05 (H) 12/09/2021   CREATININE 2.42 (H) 09/02/2021   Blood pressure being managed by nephrologist  BP Readings from Last 3 Encounters:  12/21/22 130/82  07/09/22 (!) 86/72  06/18/22 (!) 144/66       Physical Examination:  BP 130/82 (BP Location: Left Arm, Patient Position: Sitting, Cuff Size: Large)   Pulse 100   Ht 5\' 2"  (1.575 m)   Wt 159 lb (72.1 kg)   LMP  (LMP Unknown) Comment: hysterectomy 2014  SpO2 99%   BMI 29.08 kg/m   Mood appears to be apathetic  ASSESSMENT:  Diabetes type 1, on basal bolus insulin  A1c is 6%  See history of present illness for detailed discussion of current diabetes management, blood sugar patterns and problems identified  She is now using freestyle libre to monitor her blood sugar However she appears to be getting excessive insulin as discussed above, both basal and bolus Discussed  need to have time in target over 70% but only 2% or less below 70  HYPOTHYROIDISM: Previously treated adequately with the 50 mcg dose Synthroid, followed by PCP  PLAN:   Discussed not adjusting [name] based on her food intake but on her fasting blood sugars and overnight blood sugar patterns She was reduce it to 15 for now and only go up 1 or 2 units if morning sugars are consistently over 140 She likely needs only 8 units to cover her meals especially breakfast and to lower 8-7 or 8 units at dinner for lower carbohydrate meals Discussed postprandial blood sugar targets To call us if she continues to have low sugars Needs more regular follow-up  Follow-up in 3 months  Patient Instructions  Tresiba 15  Total visit time for evaluation and management and counseling = 30 minutes Azelie Noguera 12/21/2022, 4:50 PM      Note: This note was prepared with Dragon voice recognition system technology. Any transcriptional errors that result from this process are unintentional.

## 2022-12-24 ENCOUNTER — Encounter: Payer: Self-pay | Admitting: Endocrinology

## 2023-01-01 DIAGNOSIS — E039 Hypothyroidism, unspecified: Secondary | ICD-10-CM | POA: Diagnosis not present

## 2023-01-01 DIAGNOSIS — D649 Anemia, unspecified: Secondary | ICD-10-CM | POA: Diagnosis not present

## 2023-01-01 DIAGNOSIS — Z794 Long term (current) use of insulin: Secondary | ICD-10-CM | POA: Diagnosis not present

## 2023-01-01 DIAGNOSIS — J449 Chronic obstructive pulmonary disease, unspecified: Secondary | ICD-10-CM | POA: Diagnosis not present

## 2023-01-01 DIAGNOSIS — E1121 Type 2 diabetes mellitus with diabetic nephropathy: Secondary | ICD-10-CM | POA: Diagnosis not present

## 2023-01-01 DIAGNOSIS — G4733 Obstructive sleep apnea (adult) (pediatric): Secondary | ICD-10-CM | POA: Diagnosis not present

## 2023-01-01 DIAGNOSIS — I679 Cerebrovascular disease, unspecified: Secondary | ICD-10-CM | POA: Diagnosis not present

## 2023-01-01 DIAGNOSIS — I10 Essential (primary) hypertension: Secondary | ICD-10-CM | POA: Diagnosis not present

## 2023-01-01 DIAGNOSIS — F29 Unspecified psychosis not due to a substance or known physiological condition: Secondary | ICD-10-CM | POA: Diagnosis not present

## 2023-01-20 DIAGNOSIS — R809 Proteinuria, unspecified: Secondary | ICD-10-CM | POA: Diagnosis not present

## 2023-01-20 DIAGNOSIS — N184 Chronic kidney disease, stage 4 (severe): Secondary | ICD-10-CM | POA: Diagnosis not present

## 2023-01-20 DIAGNOSIS — I129 Hypertensive chronic kidney disease with stage 1 through stage 4 chronic kidney disease, or unspecified chronic kidney disease: Secondary | ICD-10-CM | POA: Diagnosis not present

## 2023-01-20 DIAGNOSIS — D631 Anemia in chronic kidney disease: Secondary | ICD-10-CM | POA: Diagnosis not present

## 2023-01-20 DIAGNOSIS — N2581 Secondary hyperparathyroidism of renal origin: Secondary | ICD-10-CM | POA: Diagnosis not present

## 2023-01-29 DIAGNOSIS — J449 Chronic obstructive pulmonary disease, unspecified: Secondary | ICD-10-CM | POA: Diagnosis not present

## 2023-01-29 DIAGNOSIS — Z794 Long term (current) use of insulin: Secondary | ICD-10-CM | POA: Diagnosis not present

## 2023-01-29 DIAGNOSIS — E870 Hyperosmolality and hypernatremia: Secondary | ICD-10-CM | POA: Diagnosis not present

## 2023-01-29 DIAGNOSIS — G4733 Obstructive sleep apnea (adult) (pediatric): Secondary | ICD-10-CM | POA: Diagnosis not present

## 2023-01-29 DIAGNOSIS — D649 Anemia, unspecified: Secondary | ICD-10-CM | POA: Diagnosis not present

## 2023-01-29 DIAGNOSIS — E1121 Type 2 diabetes mellitus with diabetic nephropathy: Secondary | ICD-10-CM | POA: Diagnosis not present

## 2023-01-29 DIAGNOSIS — E785 Hyperlipidemia, unspecified: Secondary | ICD-10-CM | POA: Diagnosis not present

## 2023-01-29 DIAGNOSIS — I10 Essential (primary) hypertension: Secondary | ICD-10-CM | POA: Diagnosis not present

## 2023-01-29 DIAGNOSIS — E039 Hypothyroidism, unspecified: Secondary | ICD-10-CM | POA: Diagnosis not present

## 2023-01-29 DIAGNOSIS — F29 Unspecified psychosis not due to a substance or known physiological condition: Secondary | ICD-10-CM | POA: Diagnosis not present

## 2023-02-01 DIAGNOSIS — E1121 Type 2 diabetes mellitus with diabetic nephropathy: Secondary | ICD-10-CM | POA: Diagnosis not present

## 2023-02-01 DIAGNOSIS — E1165 Type 2 diabetes mellitus with hyperglycemia: Secondary | ICD-10-CM | POA: Diagnosis not present

## 2023-02-07 ENCOUNTER — Other Ambulatory Visit: Payer: Self-pay | Admitting: Endocrinology

## 2023-02-07 DIAGNOSIS — E1065 Type 1 diabetes mellitus with hyperglycemia: Secondary | ICD-10-CM

## 2023-02-12 DIAGNOSIS — E870 Hyperosmolality and hypernatremia: Secondary | ICD-10-CM | POA: Diagnosis not present

## 2023-02-12 DIAGNOSIS — D649 Anemia, unspecified: Secondary | ICD-10-CM | POA: Diagnosis not present

## 2023-02-12 DIAGNOSIS — J449 Chronic obstructive pulmonary disease, unspecified: Secondary | ICD-10-CM | POA: Diagnosis not present

## 2023-02-12 DIAGNOSIS — E1121 Type 2 diabetes mellitus with diabetic nephropathy: Secondary | ICD-10-CM | POA: Diagnosis not present

## 2023-02-12 DIAGNOSIS — I10 Essential (primary) hypertension: Secondary | ICD-10-CM | POA: Diagnosis not present

## 2023-02-12 DIAGNOSIS — G4733 Obstructive sleep apnea (adult) (pediatric): Secondary | ICD-10-CM | POA: Diagnosis not present

## 2023-02-12 DIAGNOSIS — Z794 Long term (current) use of insulin: Secondary | ICD-10-CM | POA: Diagnosis not present

## 2023-02-12 DIAGNOSIS — I679 Cerebrovascular disease, unspecified: Secondary | ICD-10-CM | POA: Diagnosis not present

## 2023-02-12 DIAGNOSIS — F29 Unspecified psychosis not due to a substance or known physiological condition: Secondary | ICD-10-CM | POA: Diagnosis not present

## 2023-02-17 DIAGNOSIS — N184 Chronic kidney disease, stage 4 (severe): Secondary | ICD-10-CM | POA: Diagnosis not present

## 2023-03-08 ENCOUNTER — Other Ambulatory Visit: Payer: Self-pay | Admitting: Endocrinology

## 2023-03-12 DIAGNOSIS — Z794 Long term (current) use of insulin: Secondary | ICD-10-CM | POA: Diagnosis not present

## 2023-03-12 DIAGNOSIS — F419 Anxiety disorder, unspecified: Secondary | ICD-10-CM | POA: Diagnosis not present

## 2023-03-12 DIAGNOSIS — F29 Unspecified psychosis not due to a substance or known physiological condition: Secondary | ICD-10-CM | POA: Diagnosis not present

## 2023-03-12 DIAGNOSIS — E1121 Type 2 diabetes mellitus with diabetic nephropathy: Secondary | ICD-10-CM | POA: Diagnosis not present

## 2023-03-12 DIAGNOSIS — I10 Essential (primary) hypertension: Secondary | ICD-10-CM | POA: Diagnosis not present

## 2023-03-12 DIAGNOSIS — L309 Dermatitis, unspecified: Secondary | ICD-10-CM | POA: Diagnosis not present

## 2023-03-12 DIAGNOSIS — K219 Gastro-esophageal reflux disease without esophagitis: Secondary | ICD-10-CM | POA: Diagnosis not present

## 2023-03-12 DIAGNOSIS — E870 Hyperosmolality and hypernatremia: Secondary | ICD-10-CM | POA: Diagnosis not present

## 2023-03-12 DIAGNOSIS — I679 Cerebrovascular disease, unspecified: Secondary | ICD-10-CM | POA: Diagnosis not present

## 2023-03-22 ENCOUNTER — Encounter: Payer: Self-pay | Admitting: Endocrinology

## 2023-03-22 ENCOUNTER — Ambulatory Visit (INDEPENDENT_AMBULATORY_CARE_PROVIDER_SITE_OTHER): Payer: Medicare HMO | Admitting: Endocrinology

## 2023-03-22 VITALS — BP 130/82 | HR 101 | Ht 62.0 in | Wt 162.0 lb

## 2023-03-22 DIAGNOSIS — N1832 Chronic kidney disease, stage 3b: Secondary | ICD-10-CM

## 2023-03-22 DIAGNOSIS — T383X5A Adverse effect of insulin and oral hypoglycemic [antidiabetic] drugs, initial encounter: Secondary | ICD-10-CM | POA: Diagnosis not present

## 2023-03-22 DIAGNOSIS — E160 Drug-induced hypoglycemia without coma: Secondary | ICD-10-CM | POA: Diagnosis not present

## 2023-03-22 DIAGNOSIS — Z89429 Acquired absence of other toe(s), unspecified side: Secondary | ICD-10-CM | POA: Diagnosis not present

## 2023-03-22 DIAGNOSIS — I509 Heart failure, unspecified: Secondary | ICD-10-CM | POA: Diagnosis not present

## 2023-03-22 DIAGNOSIS — E1065 Type 1 diabetes mellitus with hyperglycemia: Secondary | ICD-10-CM

## 2023-03-22 LAB — POCT GLYCOSYLATED HEMOGLOBIN (HGB A1C): Hemoglobin A1C: 5.9 % — AB (ref 4.0–5.6)

## 2023-03-22 NOTE — Progress Notes (Signed)
Patient ID: Deanna Landry, female   DOB: August 31, 1971, 52 y.o.   MRN: [id number]           Reason for Appointment : Follow-up for Type 1 Diabetes  History of Present Illness   Referring HCP:Lee, Keung         Diagnosis: Type 1 diabetes mellitus, date of diagnosis: Age 23         Previous history:   She has been on insulin since her diagnosis when she had significant symptomatic hypoglycemia. Has been on various types of insulin regimens in the past but usually not followed by endocrinologist The last 5 or 6 years she has been on Levemir and NovoLog Previous level of control is variable with A1c only inconsistently below 7  She was on the T-slim pump started on 03/04/21 Settings: Basal rates: 12 AM- 2 AM = 0.60, 2 AM-8 AM = 0.65, 8 AM-12 AM = 0.50 ISF: 1: 60 except from 2-8 AM, mealtime carbohydrate ratio 1:1 Target 120 Active insulin 5 hours   Recent history:   BASAL insulin doses 14 units Tresiba daily at 7 pm MEALTIME insulin: 6-7 units, lunch 7 and 10 at dinner usually  Non-insulin hypoglycemic drugs: Trulicity 1.5 mg weekly   A1c is 5.9   Current management, blood sugar patterns and problems identified:   She was told to reduce her Evaristo Bury on the last visit to avoid overnight hypoglycemia but now with current dose of 14 units her blood sugars are mostly high overnight Not clear why her blood sugars are higher overnight even though the periodically are low in the afternoons and evenings both before and after some meals She appears to be taking excessive amounts of mealtime insulin and especially in the last week and is tending to get low after lunch meals and especially dinner Today even though she took about 7 units to cover her cereal she had [place] fries also and this caused initial spike in glucose to 350 but she was down to 69 in the office about 2 to 3 hours later  She is treating low blood sugars inappropriately with doughnut sticks Her weight has gone up since last year   She has taken her Trulicity but not clear if she has taken this consistently Her lunch is generally relatively lower in carbohydrate but she still takes 7 units Also with only 1 starts in the evening at dinner her blood sugars periodically getting low with blood sugar average after dinner only about 113    Mealtimes are: Breakfast none usually, lunch: 12-1 pm Dinner: 6pm         Libre 3 was downloaded and interpretation of the last 2 weeks data is as follows   OVERNIGHT blood sugars are generally running in the 140-150 range and highest around 7 AM, only rarely low normal  Blood sugars are usually declining after her lunch and supper After breakfast blood sugars are generally flat but today significantly higher Frequent episodes of hypoglycemia in the last week are occurring after dinner almost daily  PREMEAL blood sugars are higher at breakfast, generally low normal at lunch and dinner Glycemia is above  CGM use % of time   2-week average/GV 130/38  Time in range    70    %  % Time Above 180 15+1  % Time above 250   % Time Below 70  +5     PRE-MEAL Fasting Lunch Dinner 6-8 AM Overall  Glucose range:  Averages: 139   158 130   POST-MEAL PC Breakfast PC Lunch PC Dinner  Glucose range:     Averages: 141  92  113     CGM use % of time 97  2-week average/GV 145/47  Time in range       69 %  % Time Above 180 15  % Time above 250 8  % Time Below 70 8     Hypoglycemia: As above Symptoms of hypoglycemia:none or minimal Treatment of hypoglycemia: Juices, food     Self-care:  Snacks will be peanut butter crackers, yogurt, cottage cheese, fruit Eating out only about once or twice a month Exercise: None          Dietician consultation none.          Wt Readings from Last 3 Encounters:  03/22/23 162 lb (73.5 kg)  12/21/22 159 lb (72.1 kg)  07/09/22 148 lb 9.6 oz (67.4 kg)    Diabetes labs:  Lab Results  Component Value Date   HGBA1C 5.9 (A) 03/22/2023    HGBA1C 6.0 (A) 12/21/2022   HGBA1C 7.2 (A) 07/09/2022   Lab Results  Component Value Date   MICROALBUR 5.9 (H) 02/05/2022   LDLCALC 57 09/02/2021   CREATININE 2.08 (H) 06/17/2022    Lab Results  Component Value Date   MICRALBCREAT 4.3 02/05/2022     Allergies as of 03/22/2023   No Known Allergies      Medication List        Accurate as of Mar 22, 2023  8:46 PM. If you have any questions, ask your nurse or doctor.          Accu-Chek Guide test strip Generic drug: glucose blood USE TO CHECK BLOOD SUGAR 5 TIMES DAILY. DX E 10.65   acetaminophen 325 MG tablet Commonly known as: TYLENOL Take 650 mg by mouth every 6 (six) hours as needed for mild pain.   albuterol 108 (90 Base) MCG/ACT inhaler Commonly known as: VENTOLIN HFA Inhale 1-2 puffs into the lungs every 6 (six) hours as needed for wheezing or shortness of breath.   ALPRAZolam 0.25 MG tablet Commonly known as: XANAX Take 0.25 mg by mouth every 6 (six) hours as needed for anxiety or sleep.   amLODipine 2.5 MG tablet Commonly known as: NORVASC Take 2.5 mg by mouth daily.   aspirin EC 81 MG tablet Take 81 mg by mouth at bedtime.   atorvastatin 80 MG tablet Commonly known as: LIPITOR Take 80 mg by mouth at bedtime.   buPROPion 300 MG 24 hr tablet Commonly known as: WELLBUTRIN XL Take 300 mg by mouth at bedtime.   clopidogrel 75 MG tablet Commonly known as: PLAVIX Take 75 mg by mouth daily.   Easy Comfort Pen Needles 31G X 8 MM Misc Generic drug: Insulin Pen Needle Check sugar 3x  daily   Gvoke HypoPen 2-Pack 1 MG/0.2ML Soaj Generic drug: Glucagon Inject 1 mg into the skin as needed. For severe lows What changed: reasons to take this   haloperidol 5 MG tablet Commonly known as: HALDOL Take 5 mg by mouth 2 (two) times daily.   HYDROcodone-acetaminophen 5-325 MG tablet Commonly known as: NORCO/VICODIN Take 1 tablet by mouth every 8 (eight) hours as needed for moderate pain.   isosorbide  dinitrate 20 MG tablet Commonly known as: ISORDIL Take 20 mg by mouth 2 (two) times daily.   levothyroxine 50 MCG tablet Commonly known as: SYNTHROID Take 50 mcg by mouth daily before  breakfast.   Lokelma 10 g Pack packet Generic drug: sodium zirconium cyclosilicate Take 10 g by mouth See admin instructions. MON and FRI   loratadine 10 MG tablet Commonly known as: CLARITIN Take 10 mg by mouth daily as needed for allergies.   losartan 25 MG tablet Commonly known as: COZAAR Take 12.5 mg by mouth daily.   methocarbamol 750 MG tablet Commonly known as: Robaxin-750 Take 1 tablet (750 mg total) by mouth every 6 (six) hours as needed for muscle spasms.   metoCLOPramide 5 MG tablet Commonly known as: REGLAN Take 1 tablet (5 mg total) by mouth 4 (four) times daily -  before meals and at bedtime.   metoprolol tartrate 25 MG tablet Commonly known as: LOPRESSOR Take 25 mg by mouth daily.   NovoLOG 100 UNIT/ML injection Generic drug: insulin aspart USE MAXIMUM OF 65 UNITS DAILY IN INSULIN PUMP   NovoLOG FlexPen 100 UNIT/ML FlexPen Generic drug: insulin aspart USE UP TO 15 UNITS BEFORE MEALS THREE TIMES DAILY   pantoprazole 40 MG tablet Commonly known as: PROTONIX Take 40 mg by mouth at bedtime.   pregabalin 75 MG capsule Commonly known as: LYRICA Take 75 mg by mouth 2 (two) times daily.   ranolazine 500 MG 12 hr tablet Commonly known as: RANEXA TAKE 1 TABLET BY MOUTH TWICE A DAY   Rocklatan 0.02-0.005 % Soln Generic drug: Netarsudil-Latanoprost Place 1 drop into the left eye at bedtime.   senna-docusate 8.6-50 MG tablet Commonly known as: Senokot-S Take 2 tablets by mouth 2 (two) times daily.   sertraline 100 MG tablet Commonly known as: ZOLOFT Take 100 mg by mouth at bedtime.   Systane 0.4-0.3 % Gel ophthalmic gel Generic drug: Polyethyl Glycol-Propyl Glycol Place 1 application. into both eyes 3 (three) times daily as needed (dry/irritated eyes.).   Evaristo Bury  FlexTouch 100 UNIT/ML FlexTouch Pen Generic drug: insulin degludec INJECT 14 UNITS INTO THE SKIN DAILY. ADJUST AS DIRECTED   Trulicity 1.5 MG/0.5ML Sopn Generic drug: Dulaglutide Inject 1.5 mg into the skin every Monday.   Veltassa 8.4 g packet Generic drug: patiromer Take 8.4 g by mouth every other day.   Vyzulta 0.024 % Soln Generic drug: Latanoprostene Bunod Apply 1 drop to eye at bedtime.        Allergies: No Known Allergies  Past Medical History:  Diagnosis Date   Acute kidney injury superimposed on CKD (HCC) 03/15/2019   Acute respiratory failure with hypoxia (HCC) 03/15/2019   Adjustment disorder with mixed anxiety and depressed mood 03/11/2019   Anemia    Anxiety    Arthritis    right ankle   Blind    right eye   CHF (congestive heart failure) (HCC)    Closed right pilon fracture, initial encounter 07/26/2019   COPD (chronic obstructive pulmonary disease) (HCC)    Coronary artery disease    Depression    Diabetes mellitus type 2 in obese 07/26/2019   Diabetes mellitus without complication (HCC)    type 1   Diabetic polyneuropathy (HCC) 05/04/2016   Dyslipidemia 01/03/2020   Fever 03/10/2019   GERD (gastroesophageal reflux disease)    History of blood transfusion    HLD (hyperlipidemia)    Hyperglycemia    Hypertension    Hypothyroidism    Ileus, postoperative (HCC)    Major depression, recurrent (HCC) 03/09/2019   MDD (major depressive disorder), severe (HCC) 03/09/2019   Myocardial infarct, old    2020, s/p DES RCA, RPDA 10/23/19   Neck pain 04/21/2016  Pneumonia 10/2019   Renal disorder    stage 3   Sepsis (HCC) 03/10/2019   Short-term memory loss    mild   Sleep apnea    study done 9/27 and 08/13/20 - no results yet 08/21/20   Stroke (HCC)    Suicidal ideation 03/15/2019   Type 1 diabetes mellitus without complication (HCC) 03/15/2019   Wound infection after surgery (Right ankle) 10/04/2019    Past Surgical History:  Procedure Laterality Date    ABDOMINAL HYSTERECTOMY  2014   ANKLE FUSION Right 08/23/2020   Procedure: ANKLE FUSION;  Surgeon: Roby Lofts, MD;  Location: [facility] OR;  Service: Orthopedics;  Laterality: Right;   APPLICATION OF WOUND VAC Right 10/11/2019   Procedure: WOUND VAC CHANGE TO RIGHT LEG;  Surgeon: Roby Lofts, MD;  Location: [facility] OR;  Service: Orthopedics;  Laterality: Right;   CORONARY ANGIOPLASTY  10/23/2019   DES RCA, DES RPDA 10/23/19 [facility])   EYE SURGERY     HARDWARE REMOVAL Right 10/06/2019   Procedure: HARDWARE REMOVAL;  Surgeon: Roby Lofts, MD;  Location: [facility] OR;  Service: Orthopedics;  Laterality: Right;   HARDWARE REMOVAL Right 08/23/2020   Procedure: HARDWARE REMOVAL;  Surgeon: Roby Lofts, MD;  Location: [facility] OR;  Service: Orthopedics;  Laterality: Right;   I & D EXTREMITY Right 10/06/2019   Procedure: IRRIGATION AND DEBRIDEMENT EXTREMITY;  Surgeon: Roby Lofts, MD;  Location: [facility] OR;  Service: Orthopedics;  Laterality: Right;   I & D EXTREMITY Right 10/09/2019   Procedure: IRRIGATION AND DEBRIDEMENT EXTREMITY and WOUND VAC CHANGE RIGHT ANKLE;  Surgeon: Roby Lofts, MD;  Location: [facility] OR;  Service: Orthopedics;  Laterality: Right;   ORIF ANKLE FRACTURE Right 07/26/2019   Procedure: OPEN REDUCTION INTERNAL FIXATION (ORIF) ANKLE FRACTURE;  Surgeon: Roby Lofts, MD;  Location: [facility] OR;  Service: Orthopedics;  Laterality: Right;  OPEN REDUCTION INTERNAL FIXATION (ORIF) ANKLE FRACTURE    TOE AMPUTATION      Family History  Problem Relation Age of Onset   Diabetes Mellitus II Mother    Stroke Mother    Heart attack Mother    Stomach cancer Father    Congestive Heart Failure Father    Diabetes Sister    Congestive Heart Failure Maternal Grandfather     Social History:  reports that she has never smoked. She has never used smokeless tobacco. She reports that she does not currently use alcohol. She reports that she does not currently use drugs.      Review of  Systems    HYPOTHYROIDISM: Treated by PCP with levothyroxine 50 mcg and all stable doses  Lab Results  Component Value Date   TSH 1.849 06/17/2022   TSH 1.53 04/14/2022   TSH 1.102 03/12/2019   FREET4 1.69 (H) 04/14/2022         Lipids:   She is on a statin drug, currently on 80 mg Lipitor  Lab Results  Component Value Date   CHOL 117 09/02/2021   HDL 46.80 09/02/2021   LDLCALC 57 09/02/2021   TRIG 63.0 09/02/2021   CHOLHDL 2 09/02/2021     Peripheral neuropathy: She will get at times pains and paresthesia in her legs treated with Lyrica and tramadol.   Also has numbness in her hands  DIABETES COMPLICATIONS: Neuropathy, retinopathy nephropathy   CKD: Labs as follows, regularly seen by nephrologist and reportedly renal function is stable  Lab Results  Component Value Date   CREATININE 2.08 (H) 06/17/2022  CREATININE 2.05 (H) 12/09/2021   CREATININE 2.42 (H) 09/02/2021   Blood pressure being managed by nephrologist  BP Readings from Last 3 Encounters:  03/22/23 130/82  12/21/22 130/82  07/09/22 (!) 86/72       Physical Examination:  BP 130/82 (BP Location: Left Arm, Patient Position: Sitting, Cuff Size: Normal)   Pulse (!) 101   Ht 5\' 2"  (1.575 m)   Wt 162 lb (73.5 kg)   LMP  (LMP Unknown) Comment: hysterectomy 2014  SpO2 98%   BMI 29.63 kg/m   Diabetic Foot Exam - Simple   Simple Foot Form Diabetic Foot exam was performed with the following findings: Yes   Visual Inspection No deformities, no ulcerations, no other skin breakdown bilaterally: Yes Sensation Testing Intact to touch and monofilament testing bilaterally: Yes Pulse Check Posterior Tibialis and Dorsalis pulse intact bilaterally: Yes Comments     ASSESSMENT:  Diabetes type 1, on basal bolus insulin  A1c is 5.9 %  See history of present illness for detailed discussion of current diabetes management, blood sugar patterns and problems identified  Based on her freestyle libre  readings she is getting tendency to low sugars during the day but higher readings overnight Likely high readings overnight are rebounding from low sugars later in the evenings as the highest readings are around 6-7 AM Currently time in target is 70% but 14% below 70  HYPOTHYROIDISM:followed by PCP  PLAN:   Discussed needing to reduce her mealtime coverage because of her relatively low carbohydrate intake and tendency to hypoglycemia She will take NovoLog as follows  Breakfast: 5-6 units  She does need to add protein in am not just eat cereal  Lunch 4 units Novolog   Supper 7-8 units Make sure she takes her NovoLog before starting to eat  If her blood sugars are still consistently high waking up she can go up to 15 or 16 on the [place]  Discussed in detail appropriate treatment of hypoglycemia and given her handout on this  Follow-up in 3 months  Patient Instructions  Breakfast: 5-6 units but add protein in am  Lunch 4 units Novolog   Supper 7-8 units     Total visit time for evaluation and management and counseling = 30 minutes Reather Littler 03/22/2023, 8:46 PM      Note: This note was prepared with Dragon voice recognition system technology. Any transcriptional errors that result from this process are unintentional.

## 2023-03-22 NOTE — Patient Instructions (Addendum)
Breakfast: 5-6 units but add protein in am  Lunch 4 units Novolog   Supper 7-8 units

## 2023-03-23 ENCOUNTER — Encounter: Payer: Self-pay | Admitting: Endocrinology

## 2023-03-25 ENCOUNTER — Telehealth: Payer: Self-pay

## 2023-03-25 NOTE — Telephone Encounter (Signed)
Emailed Mr. Deanna Landry and he is unsure what she is talking about. Nothing noted in her account that mentions that. She has a reorder set for 04/27/23.  I spoke to the patient and advised she have them call us if they call her again.

## 2023-03-25 NOTE — Telephone Encounter (Signed)
Patient left message on machine asking for chem strips for freestyle libre 3. I called the patient and confirmed that is what she was asking for. I am unfamiliar with this so I have reached out to Cathren Laine at [employer] for his help. I have sent him secured email with patient info and he will find out what she needs and let us know.

## 2023-03-26 DIAGNOSIS — E870 Hyperosmolality and hypernatremia: Secondary | ICD-10-CM | POA: Diagnosis not present

## 2023-03-26 DIAGNOSIS — L309 Dermatitis, unspecified: Secondary | ICD-10-CM | POA: Diagnosis not present

## 2023-03-26 DIAGNOSIS — Z794 Long term (current) use of insulin: Secondary | ICD-10-CM | POA: Diagnosis not present

## 2023-03-26 DIAGNOSIS — I679 Cerebrovascular disease, unspecified: Secondary | ICD-10-CM | POA: Diagnosis not present

## 2023-03-26 DIAGNOSIS — E1121 Type 2 diabetes mellitus with diabetic nephropathy: Secondary | ICD-10-CM | POA: Diagnosis not present

## 2023-03-26 DIAGNOSIS — K219 Gastro-esophageal reflux disease without esophagitis: Secondary | ICD-10-CM | POA: Diagnosis not present

## 2023-03-26 DIAGNOSIS — F419 Anxiety disorder, unspecified: Secondary | ICD-10-CM | POA: Diagnosis not present

## 2023-03-26 DIAGNOSIS — I10 Essential (primary) hypertension: Secondary | ICD-10-CM | POA: Diagnosis not present

## 2023-03-26 DIAGNOSIS — I2510 Atherosclerotic heart disease of native coronary artery without angina pectoris: Secondary | ICD-10-CM | POA: Diagnosis not present

## 2023-04-26 DIAGNOSIS — J069 Acute upper respiratory infection, unspecified: Secondary | ICD-10-CM | POA: Diagnosis not present

## 2023-04-26 DIAGNOSIS — H6692 Otitis media, unspecified, left ear: Secondary | ICD-10-CM | POA: Diagnosis not present

## 2023-05-02 DIAGNOSIS — E1165 Type 2 diabetes mellitus with hyperglycemia: Secondary | ICD-10-CM | POA: Diagnosis not present

## 2023-06-10 ENCOUNTER — Other Ambulatory Visit: Payer: Medicare HMO

## 2023-06-14 ENCOUNTER — Ambulatory Visit: Payer: Medicare HMO | Admitting: Endocrinology

## 2023-06-15 ENCOUNTER — Other Ambulatory Visit: Payer: Self-pay | Admitting: Endocrinology

## 2023-06-15 DIAGNOSIS — E1065 Type 1 diabetes mellitus with hyperglycemia: Secondary | ICD-10-CM

## 2023-06-29 ENCOUNTER — Other Ambulatory Visit: Payer: Medicare HMO

## 2023-06-29 ENCOUNTER — Other Ambulatory Visit: Payer: Self-pay | Admitting: Endocrinology

## 2023-06-29 DIAGNOSIS — E1065 Type 1 diabetes mellitus with hyperglycemia: Secondary | ICD-10-CM | POA: Diagnosis not present

## 2023-06-29 DIAGNOSIS — E039 Hypothyroidism, unspecified: Secondary | ICD-10-CM

## 2023-06-29 LAB — HEMOGLOBIN A1C: Hgb A1c MFr Bld: 6.9 % — ABNORMAL HIGH (ref 4.6–6.5)

## 2023-06-29 LAB — TSH: TSH: 4.97 u[IU]/mL (ref 0.35–5.50)

## 2023-06-29 LAB — GLUCOSE, RANDOM: Glucose, Bld: 150 mg/dL — ABNORMAL HIGH (ref 70–99)

## 2023-07-04 ENCOUNTER — Other Ambulatory Visit: Payer: Self-pay | Admitting: Endocrinology

## 2023-07-04 DIAGNOSIS — E1065 Type 1 diabetes mellitus with hyperglycemia: Secondary | ICD-10-CM

## 2023-07-06 ENCOUNTER — Ambulatory Visit (INDEPENDENT_AMBULATORY_CARE_PROVIDER_SITE_OTHER): Payer: Medicare HMO | Admitting: "Endocrinology

## 2023-07-06 ENCOUNTER — Encounter: Payer: Self-pay | Admitting: "Endocrinology

## 2023-07-06 VITALS — BP 100/55 | HR 55 | Ht 62.0 in | Wt 159.0 lb

## 2023-07-06 DIAGNOSIS — E1065 Type 1 diabetes mellitus with hyperglycemia: Secondary | ICD-10-CM

## 2023-07-06 DIAGNOSIS — Z7985 Long-term (current) use of injectable non-insulin antidiabetic drugs: Secondary | ICD-10-CM

## 2023-07-06 DIAGNOSIS — E7800 Pure hypercholesterolemia, unspecified: Secondary | ICD-10-CM

## 2023-07-06 DIAGNOSIS — E10649 Type 1 diabetes mellitus with hypoglycemia without coma: Secondary | ICD-10-CM

## 2023-07-06 MED ORDER — GVOKE HYPOPEN 1-PACK 1 MG/0.2ML ~~LOC~~ SOAJ: 1.0000 mg | SUBCUTANEOUS | 2 refills | Status: DC | PRN

## 2023-07-06 MED ORDER — NOVOLOG FLEXPEN 100 UNIT/ML ~~LOC~~ SOPN
PEN_INJECTOR | SUBCUTANEOUS | 3 refills | Status: DC
Start: 2023-07-06 — End: 2023-08-20

## 2023-07-06 MED ORDER — TRESIBA FLEXTOUCH 100 UNIT/ML ~~LOC~~ SOPN
15.0000 [IU] | PEN_INJECTOR | Freq: Every day | SUBCUTANEOUS | 3 refills | Status: DC
Start: 2023-07-06 — End: 2023-08-20

## 2023-07-06 NOTE — Progress Notes (Signed)
Outpatient Endocrinology Note [place], MD  07/06/23   Deanna Landry 02/15/1971 [id number]  Referring Provider: Simone Curia, MD Primary Care Provider: Simone Curia, MD Reason for consultation: Subjective   Assessment & Plan  Diagnoses and all orders for this visit:  Uncontrolled type 1 diabetes mellitus with hypoglycemia without coma (HCC) -     Microalbumin / creatinine urine ratio; Future -     Lipid panel; Future  Long-term (current) use of injectable non-insulin antidiabetic drugs  Pure hypercholesterolemia  Other orders -     Glucagon (GVOKE HYPOPEN 1-PACK) 1 MG/0.2ML SOAJ; Inject 1 mg into the skin as needed (low blood sugar with impaired consciousness).    Diabetes Type I complicated by MI, stroke, neuropathy  Lab Results  Component Value Date   GFR 27.95 (L) 12/09/2021   Hba1c goal less than 7, current Hba1c is  Lab Results  Component Value Date   HGBA1C 6.9 (H) 06/29/2023   Will recommend the following: 15 units Tresiba daily Novolog 8 units tid fifteen min before meals  Ozempic 1 mg weekly  No known contraindications/side effects to any of above medications Glucagon discussed and prescribed with refills on 07/06/23 Lives with mother  -Last LD and Tg are as follows: Lab Results  Component Value Date   LDLCALC 57 09/02/2021    Lab Results  Component Value Date   TRIG 63.0 09/02/2021   -On atorvastatin 80 mg QD -Follow low fat diet and exercise   -Blood pressure goal <140/90 - Microalbumin/creatinine goal is < 30 -Last MA/Cr is as follows: Lab Results  Component Value Date   MICROALBUR 5.9 (H) 02/05/2022   -on ACE/ARB losartan 25 mg qd -diet changes including salt restriction -limit eating outside -counseled BP targets per standards of diabetes care -uncontrolled blood pressure can lead to retinopathy, nephropathy and cardiovascular and atherosclerotic heart disease  Reviewed and counseled on: -A1C target -Blood sugar  targets -Complications of uncontrolled diabetes  -Checking blood sugar before meals and bedtime and bring log next visit -All medications with mechanism of action and side effects -Hypoglycemia management: rule of 15's, Glucagon Emergency Kit and medical alert ID -low-carb low-fat plate-method diet -At least 20 minutes of physical activity per day -Annual dilated retinal eye exam and foot exam -compliance and follow up needs -follow up as scheduled or earlier if problem gets worse  Call if blood sugar is less than 70 or consistently above 250    Take a 15 gm snack of carbohydrate at bedtime before you go to sleep if your blood sugar is less than 100.    If you are going to fast after midnight for a test or procedure, ask your physician for instructions on how to reduce/decrease your insulin dose.    Call if blood sugar is less than 70 or consistently above 250  -Treating a low sugar by rule of 15  (15 gms of sugar every 15 min until sugar is more than 70) If you feel your sugar is low, test your sugar to be sure If your sugar is low (less than 70), then take 15 grams of a fast acting Carbohydrate (3-4 glucose tablets or glucose gel or 4 ounces of juice or regular soda) Recheck your sugar 15 min after treating low to make sure it is more than 70 If sugar is still less than 70, treat again with 15 grams of carbohydrate          Don't drive the hour of hypoglycemia  If unconscious/unable to eat or drink by mouth, use glucagon injection or nasal spray baqsimi and call 911. Can repeat again in 15 min if still unconscious.  Return in about 3 months (around 10/06/2023).   I have reviewed current medications, nurse's notes, allergies, vital signs, past medical and surgical history, family medical history, and social history for this encounter. Counseled patient on symptoms, examination findings, lab findings, imaging results, treatment decisions and monitoring and prognosis. The patient  understood the recommendations and agrees with the treatment plan. All questions regarding treatment plan were fully answered.  [place], MD  07/06/23    History of Present Illness Deanna Landry is a 52 y.o. year old female who presents for evaluation of Type I diabetes mellitus.  Deanna Landry was first diagnosed in age 77.    Home diabetes regimen: 15 units Tresiba daily Novolog 10 units tid after meals  Ozempic 1 mg weekly  Liked Trulicity 1.5 mg weekly better,m but not covered by insurance per pt   Previous history:   She has been on insulin since her diagnosis when she had significant symptomatic hypoglycemia. Has been on various types of insulin regimens in the past but usually not followed by endocrinologist The last 5 or 6 years she has been on Levemir and NovoLog Previous level of control is variable with A1c only inconsistently below 7  She was on the T-slim pump started on 03/04/21 Settings: Basal rates: 12 AM- 2 AM = 0.60, 2 AM-8 AM = 0.65, 8 AM-12 AM = 0.50 ISF: 1: 60 except from 2-8 AM, mealtime carbohydrate ratio 1:1 Target 120 Active insulin 5 hours   COMPLICATIONS +  MI/Stroke -  retinopathy +  neuropathy +  nephropathy  BLOOD SUGAR DATA  CGM interpretation: At today's visit, we reviewed her CGM downloads. The full report is scanned in the media. Reviewing the CGM trends, BG are low in the evening and some in morning>afternoon.   Physical Exam  BP (!) 100/55   Pulse (!) 55   Ht 5\' 2"  (1.575 m)   Wt 159 lb (72.1 kg)   LMP  (LMP Unknown) Comment: hysterectomy 2014  SpO2 94%   BMI 29.08 kg/m    Constitutional: well developed, well nourished Head: normocephalic, atraumatic Eyes: sclera anicteric, no redness Neck: supple Lungs: normal respiratory effort Neurology: alert and oriented Skin: dry, no appreciable rashes Musculoskeletal: no appreciable defects Psychiatric: normal mood and affect Diabetic Foot Exam - Simple   No data filed       Current Medications Patient's Medications  New Prescriptions   GLUCAGON (GVOKE HYPOPEN 1-PACK) 1 MG/0.2ML SOAJ    Inject 1 mg into the skin as needed (low blood sugar with impaired consciousness).  Previous Medications   ACCU-CHEK GUIDE TEST STRIP    USE TO CHECK BLOOD SUGAR 5 TIMES DAILY. DX E 10.65   ACETAMINOPHEN (TYLENOL) 325 MG TABLET    Take 650 mg by mouth every 6 (six) hours as needed for mild pain.   ALBUTEROL (VENTOLIN HFA) 108 (90 BASE) MCG/ACT INHALER    Inhale 1-2 puffs into the lungs every 6 (six) hours as needed for wheezing or shortness of breath.   ALPRAZOLAM (XANAX) 0.25 MG TABLET    Take 0.25 mg by mouth every 6 (six) hours as needed for anxiety or sleep.   AMLODIPINE (NORVASC) 2.5 MG TABLET    Take 2.5 mg by mouth daily.   ASPIRIN EC 81 MG TABLET    Take 81 mg by  mouth at bedtime.   ATORVASTATIN (LIPITOR) 80 MG TABLET    Take 80 mg by mouth at bedtime.   BUPROPION (WELLBUTRIN XL) 300 MG 24 HR TABLET    Take 300 mg by mouth at bedtime.   CLOPIDOGREL (PLAVIX) 75 MG TABLET    Take 75 mg by mouth daily.   GLUCAGON (GVOKE HYPOPEN 2-PACK) 1 MG/0.2ML SOAJ    Inject 1 mg into the skin as needed. For severe lows   HALOPERIDOL (HALDOL) 5 MG TABLET    Take 5 mg by mouth 2 (two) times daily.   HYDROCODONE-ACETAMINOPHEN (NORCO/VICODIN) 5-325 MG TABLET    Take 1 tablet by mouth every 8 (eight) hours as needed for moderate pain.   INSULIN ASPART (NOVOLOG FLEXPEN) 100 UNIT/ML FLEXPEN    USE UP TO 15 UNITS BEFORE MEALS THREE TIMES DAILY   INSULIN ASPART (NOVOLOG) 100 UNIT/ML INJECTION    USE MAXIMUM OF 65 UNITS DAILY IN INSULIN PUMP   INSULIN DEGLUDEC (TRESIBA FLEXTOUCH) 100 UNIT/ML FLEXTOUCH PEN    INJECT 14 UNITS INTO THE SKIN DAILY. ADJUST AS DIRECTED   INSULIN PEN NEEDLE (EASY COMFORT PEN NEEDLES) 31G X 8 MM MISC    Check sugar 3x  daily   ISOSORBIDE DINITRATE (ISORDIL) 20 MG TABLET    Take 20 mg by mouth 2 (two) times daily.   LATANOPROSTENE BUNOD (VYZULTA) 0.024 % SOLN    Apply 1  drop to eye at bedtime.   LEVOTHYROXINE (SYNTHROID) 50 MCG TABLET    Take 50 mcg by mouth daily before breakfast.    LORATADINE (CLARITIN) 10 MG TABLET    Take 10 mg by mouth daily as needed for allergies.   LOSARTAN (COZAAR) 25 MG TABLET    Take 12.5 mg by mouth daily.   METHOCARBAMOL (ROBAXIN-750) 750 MG TABLET    Take 1 tablet (750 mg total) by mouth every 6 (six) hours as needed for muscle spasms.   METOCLOPRAMIDE (REGLAN) 5 MG TABLET    Take 1 tablet (5 mg total) by mouth 4 (four) times daily -  before meals and at bedtime.   METOPROLOL TARTRATE (LOPRESSOR) 25 MG TABLET    Take 25 mg by mouth daily.   NETARSUDIL-LATANOPROST (ROCKLATAN) 0.02-0.005 % SOLN    Place 1 drop into the left eye at bedtime.   PANTOPRAZOLE (PROTONIX) 40 MG TABLET    Take 40 mg by mouth at bedtime.   PATIROMER (VELTASSA) 8.4 G PACKET    Take 8.4 g by mouth every other day.   POLYETHYL GLYCOL-PROPYL GLYCOL (SYSTANE) 0.4-0.3 % GEL OPHTHALMIC GEL    Place 1 application. into both eyes 3 (three) times daily as needed (dry/irritated eyes.).   PREGABALIN (LYRICA) 75 MG CAPSULE    Take 75 mg by mouth 2 (two) times daily.   RANOLAZINE (RANEXA) 500 MG 12 HR TABLET    TAKE 1 TABLET BY MOUTH TWICE A DAY   SENNA-DOCUSATE (SENOKOT-S) 8.6-50 MG TABLET    Take 2 tablets by mouth 2 (two) times daily.   SERTRALINE (ZOLOFT) 100 MG TABLET    Take 100 mg by mouth at bedtime.   SODIUM ZIRCONIUM CYCLOSILICATE (LOKELMA) 10 G PACK PACKET    Take 10 g by mouth See admin instructions. MON and FRI   TRULICITY 1.5 MG/0.5ML SOPN    Inject 1.5 mg into the skin every Monday.   Modified Medications   No medications on file  Discontinued Medications   No medications on file    Allergies No Known Allergies  Past Medical History Past Medical History:  Diagnosis Date   Acute kidney injury superimposed on CKD (HCC) 03/15/2019   Acute respiratory failure with hypoxia (HCC) 03/15/2019   Adjustment disorder with mixed anxiety and depressed mood  03/11/2019   Anemia    Anxiety    Arthritis    right ankle   Blind    right eye   CHF (congestive heart failure) (HCC)    Closed right pilon fracture, initial encounter 07/26/2019   COPD (chronic obstructive pulmonary disease) (HCC)    Coronary artery disease    Depression    Diabetes mellitus type 2 in obese 07/26/2019   Diabetes mellitus without complication (HCC)    type 1   Diabetic polyneuropathy (HCC) 05/04/2016   Dyslipidemia 01/03/2020   Fever 03/10/2019   GERD (gastroesophageal reflux disease)    History of blood transfusion    HLD (hyperlipidemia)    Hyperglycemia    Hypertension    Hypothyroidism    Ileus, postoperative (HCC)    Major depression, recurrent (HCC) 03/09/2019   MDD (major depressive disorder), severe (HCC) 03/09/2019   Myocardial infarct, old    2020, s/p DES RCA, RPDA 10/23/19   Neck pain 04/21/2016   Pneumonia 10/2019   Renal disorder    stage 3   Sepsis (HCC) 03/10/2019   Short-term memory loss    mild   Sleep apnea    study done 9/27 and 08/13/20 - no results yet 08/21/20   Stroke (HCC)    Suicidal ideation 03/15/2019   Type 1 diabetes mellitus without complication (HCC) 03/15/2019   Wound infection after surgery (Right ankle) 10/04/2019    Past Surgical History Past Surgical History:  Procedure Laterality Date   ABDOMINAL HYSTERECTOMY  2014   ANKLE FUSION Right 08/23/2020   Procedure: ANKLE FUSION;  Surgeon: Roby Lofts, MD;  Location: [facility] OR;  Service: Orthopedics;  Laterality: Right;   APPLICATION OF WOUND VAC Right 10/11/2019   Procedure: WOUND VAC CHANGE TO RIGHT LEG;  Surgeon: Roby Lofts, MD;  Location: [facility] OR;  Service: Orthopedics;  Laterality: Right;   CORONARY ANGIOPLASTY  10/23/2019   DES RCA, DES RPDA 10/23/19 [facility])   EYE SURGERY     HARDWARE REMOVAL Right 10/06/2019   Procedure: HARDWARE REMOVAL;  Surgeon: Roby Lofts, MD;  Location: [facility] OR;  Service: Orthopedics;  Laterality: Right;   HARDWARE REMOVAL Right 08/23/2020    Procedure: HARDWARE REMOVAL;  Surgeon: Roby Lofts, MD;  Location: [facility] OR;  Service: Orthopedics;  Laterality: Right;   I & D EXTREMITY Right 10/06/2019   Procedure: IRRIGATION AND DEBRIDEMENT EXTREMITY;  Surgeon: Roby Lofts, MD;  Location: [facility] OR;  Service: Orthopedics;  Laterality: Right;   I & D EXTREMITY Right 10/09/2019   Procedure: IRRIGATION AND DEBRIDEMENT EXTREMITY and WOUND VAC CHANGE RIGHT ANKLE;  Surgeon: Roby Lofts, MD;  Location: [facility] OR;  Service: Orthopedics;  Laterality: Right;   ORIF ANKLE FRACTURE Right 07/26/2019   Procedure: OPEN REDUCTION INTERNAL FIXATION (ORIF) ANKLE FRACTURE;  Surgeon: Roby Lofts, MD;  Location: [facility] OR;  Service: Orthopedics;  Laterality: Right;  OPEN REDUCTION INTERNAL FIXATION (ORIF) ANKLE FRACTURE    TOE AMPUTATION      Family History family history includes Congestive Heart Failure in her father and maternal grandfather; Diabetes in her sister; Diabetes Mellitus II in her mother; Heart attack in her mother; Stomach cancer in her father; Stroke in her mother.  Social History Social History   Socioeconomic History  Marital status: Divorced    Spouse name: Not on file   Number of children: Not on file   Years of education: Not on file   Highest education level: Not on file  Occupational History   Not on file  Tobacco Use   Smoking status: Never   Smokeless tobacco: Never  Vaping Use   Vaping status: Never Used  Substance and Sexual Activity   Alcohol use: Not Currently   Drug use: Not Currently   Sexual activity: Not on file    Comment: Hysterectomy  Other Topics Concern   Not on file  Social History Narrative   Not on file   Social Determinants of Health   Financial Resource Strain: Not on file  Food Insecurity: Not on file  Transportation Needs: No Transportation Needs (08/31/2019)   PRAPARE - Administrator, Civil Service (Medical): No    Lack of Transportation (Non-Medical): No  Physical Activity:  Not on file  Stress: Not on file  Social Connections: Not on file  Intimate Partner Violence: Not on file    Lab Results  Component Value Date   HGBA1C 6.9 (H) 06/29/2023   HGBA1C 5.9 (A) 03/22/2023   HGBA1C 6.0 (A) 12/21/2022   Lab Results  Component Value Date   CHOL 117 09/02/2021   Lab Results  Component Value Date   HDL 46.80 09/02/2021   Lab Results  Component Value Date   LDLCALC 57 09/02/2021   Lab Results  Component Value Date   TRIG 63.0 09/02/2021   Lab Results  Component Value Date   CHOLHDL 2 09/02/2021   Lab Results  Component Value Date   CREATININE 2.08 (H) 06/17/2022   Lab Results  Component Value Date   GFR 27.95 (L) 12/09/2021   Lab Results  Component Value Date   MICROALBUR 5.9 (H) 02/05/2022      Component Value Date/Time   NA 138 06/17/2022 1240   K 3.8 06/17/2022 1240   CL 98 06/17/2022 1240   CO2 27 06/17/2022 1240   GLUCOSE 150 (H) 06/29/2023 1120   BUN 27 (H) 06/17/2022 1240   CREATININE 2.08 (H) 06/17/2022 1240   CALCIUM 9.6 06/17/2022 1240   PROT 5.1 (L) 06/17/2022 2005   ALBUMIN 2.8 (L) 06/17/2022 2005   AST 24 06/17/2022 2005   ALT 14 06/17/2022 2005   ALKPHOS 57 06/17/2022 2005   BILITOT 1.0 06/17/2022 2005   GFRNONAA 29 (L) 06/17/2022 1240   GFRAA 31 (L) 07/12/2020 1356      Latest Ref Rng & Units 06/29/2023   11:20 AM 06/17/2022   12:40 PM 04/14/2022    2:16 PM  BMP  Glucose 70 - 99 mg/dL 742  595  638   BUN 6 - 20 mg/dL  27    Creatinine 7.56 - 1.00 mg/dL  4.33    Sodium 295 - 188 mmol/L  138    Potassium 3.5 - 5.1 mmol/L  3.8    Chloride 98 - 111 mmol/L  98    CO2 22 - 32 mmol/L  27    Calcium 8.9 - 10.3 mg/dL  9.6         Component Value Date/Time   WBC 8.6 06/17/2022 1240   RBC 4.87 06/17/2022 1240   HGB 14.2 06/17/2022 1240   HCT 41.7 06/17/2022 1240   PLT 337 06/17/2022 1240   MCV 85.6 06/17/2022 1240   MCH 29.2 06/17/2022 1240   MCHC 34.1 06/17/2022 1240   RDW  13.2 06/17/2022 1240   LYMPHSABS  0.6 (L) 07/29/2021 2311   MONOABS 0.3 07/29/2021 2311   EOSABS 0.1 07/29/2021 2311   BASOSABS 0.0 07/29/2021 2311     Parts of this note may have been dictated using voice recognition software. There may be variances in spelling and vocabulary which are unintentional. Not all errors are proofread. Please notify the There[facility] Parkin if any discrepancies are noted or if the meaning of any statement is not clear.

## 2023-07-06 NOTE — Patient Instructions (Signed)

## 2023-08-09 ENCOUNTER — Other Ambulatory Visit: Payer: Self-pay

## 2023-08-09 ENCOUNTER — Emergency Department (HOSPITAL_COMMUNITY)
Admission: EM | Admit: 2023-08-09 | Discharge: 2023-08-10 | Disposition: A | Discharge status: Other Acute Inpatient Hospital | Payer: Medicare HMO | Attending: Emergency Medicine | Admitting: Emergency Medicine

## 2023-08-09 ENCOUNTER — Encounter (HOSPITAL_COMMUNITY): Payer: Self-pay

## 2023-08-09 DIAGNOSIS — Z7902 Long term (current) use of antithrombotics/antiplatelets: Secondary | ICD-10-CM | POA: Insufficient documentation

## 2023-08-09 DIAGNOSIS — E1022 Type 1 diabetes mellitus with diabetic chronic kidney disease: Secondary | ICD-10-CM | POA: Insufficient documentation

## 2023-08-09 DIAGNOSIS — Z7982 Long term (current) use of aspirin: Secondary | ICD-10-CM | POA: Diagnosis not present

## 2023-08-09 DIAGNOSIS — R45851 Suicidal ideations: Secondary | ICD-10-CM | POA: Diagnosis not present

## 2023-08-09 DIAGNOSIS — J449 Chronic obstructive pulmonary disease, unspecified: Secondary | ICD-10-CM | POA: Insufficient documentation

## 2023-08-09 DIAGNOSIS — I509 Heart failure, unspecified: Secondary | ICD-10-CM | POA: Diagnosis not present

## 2023-08-09 DIAGNOSIS — N183 Chronic kidney disease, stage 3 unspecified: Secondary | ICD-10-CM | POA: Insufficient documentation

## 2023-08-09 DIAGNOSIS — Z79899 Other long term (current) drug therapy: Secondary | ICD-10-CM | POA: Insufficient documentation

## 2023-08-09 DIAGNOSIS — E039 Hypothyroidism, unspecified: Secondary | ICD-10-CM | POA: Insufficient documentation

## 2023-08-09 DIAGNOSIS — R443 Hallucinations, unspecified: Secondary | ICD-10-CM | POA: Diagnosis present

## 2023-08-09 DIAGNOSIS — Z794 Long term (current) use of insulin: Secondary | ICD-10-CM | POA: Diagnosis not present

## 2023-08-09 DIAGNOSIS — I130 Hypertensive heart and chronic kidney disease with heart failure and stage 1 through stage 4 chronic kidney disease, or unspecified chronic kidney disease: Secondary | ICD-10-CM | POA: Insufficient documentation

## 2023-08-09 DIAGNOSIS — F251 Schizoaffective disorder, depressive type: Secondary | ICD-10-CM | POA: Diagnosis present

## 2023-08-09 DIAGNOSIS — I2510 Atherosclerotic heart disease of native coronary artery without angina pectoris: Secondary | ICD-10-CM | POA: Insufficient documentation

## 2023-08-09 LAB — COMPREHENSIVE METABOLIC PANEL
ALT: 18 U/L (ref 0–44)
AST: 18 U/L (ref 15–41)
Albumin: 3.7 g/dL (ref 3.5–5.0)
Alkaline Phosphatase: 115 U/L (ref 38–126)
Anion gap: 10 (ref 5–15)
BUN: 24 mg/dL — ABNORMAL HIGH (ref 6–20)
CO2: 26 mmol/L (ref 22–32)
Calcium: 8.8 mg/dL — ABNORMAL LOW (ref 8.9–10.3)
Chloride: 101 mmol/L (ref 98–111)
Creatinine, Ser: 2.07 mg/dL — ABNORMAL HIGH (ref 0.44–1.00)
GFR, Estimated: 28 mL/min — ABNORMAL LOW (ref >60)
Glucose, Bld: 270 mg/dL — ABNORMAL HIGH (ref 70–99)
Potassium: 4.7 mmol/L (ref 3.5–5.1)
Sodium: 137 mmol/L (ref 135–145)
Total Bilirubin: 0.5 mg/dL (ref 0.3–1.2)
Total Protein: 7 g/dL (ref 6.5–8.1)

## 2023-08-09 LAB — CBC WITH DIFFERENTIAL/PLATELET
Abs Immature Granulocytes: 0.02 10*3/uL (ref 0.00–0.07)
Basophils Absolute: 0 10*3/uL (ref 0.0–0.1)
Basophils Relative: 1 %
Eosinophils Absolute: 0 10*3/uL (ref 0.0–0.5)
Eosinophils Relative: 1 %
HCT: 39 % (ref 36.0–46.0)
Hemoglobin: 12.7 g/dL (ref 12.0–15.0)
Immature Granulocytes: 1 %
Lymphocytes Relative: 22 %
Lymphs Abs: 1 10*3/uL (ref 0.7–4.0)
MCH: 28.6 pg (ref 26.0–34.0)
MCHC: 32.6 g/dL (ref 30.0–36.0)
MCV: 87.8 fL (ref 80.0–100.0)
Monocytes Absolute: 0.3 10*3/uL (ref 0.1–1.0)
Monocytes Relative: 7 %
Neutro Abs: 3.1 10*3/uL (ref 1.7–7.7)
Neutrophils Relative %: 68 %
Platelets: 171 10*3/uL (ref 150–400)
RBC: 4.44 MIL/uL (ref 3.87–5.11)
RDW: 13.5 % (ref 11.5–15.5)
WBC: 4.4 10*3/uL (ref 4.0–10.5)
nRBC: 0 % (ref 0.0–0.2)

## 2023-08-09 LAB — ETHANOL: Alcohol, Ethyl (B): <10 mg/dL (ref <10)

## 2023-08-09 LAB — CBG MONITORING, ED: Glucose-Capillary: 217 mg/dL — ABNORMAL HIGH (ref 70–99)

## 2023-08-09 MED ORDER — ALPRAZOLAM 0.25 MG PO TABS
0.2500 mg | ORAL_TABLET | Freq: Once | ORAL | Status: AC
  Administered 2023-08-09: 0.25 mg via ORAL
  Filled 2023-08-09: qty 1

## 2023-08-09 NOTE — ED Provider Triage Note (Signed)
Emergency Medicine Provider Triage Evaluation Note  Deanna Landry , a 52 y.o. female  was evaluated in triage.  Pt complains of hallucinations.  Reports that she has been "seeing bugs" for the past couple weeks.  She states that she takes Haldol twice a day every day to help with her symptoms.  She states that she is ending the medication is working anymore.  She endorses SI but denies HI.  Review of Systems  Positive: SI, hallucinations Negative: HI  Physical Exam  LMP  (LMP Unknown) Comment: hysterectomy 2014  SpO2 96%  Gen:   Awake, no distress   Resp:  Normal effort  MSK:   Moves extremities without difficulty  Other:    Medical Decision Making  Medically screening exam initiated at 5:48 PM.  Appropriate orders placed.  Deanna Landry was informed that the remainder of the evaluation will be completed by another provider, this initial triage assessment does not replace that evaluation, and the importance of remaining in the ED until their evaluation is complete.   Maxwell Marion, PA-C 08/09/23 1749

## 2023-08-09 NOTE — ED Provider Notes (Signed)
[place] EMERGENCY DEPARTMENT AT [facility] Provider Note   CSN: [id number] Arrival date & time: 08/09/23  1725     History  Chief Complaint  Patient presents with   Hallucinations    Deanna Landry is a 52 y.o. female.  HPI     This a 52 year old female with a history of psychosis and suicidal ideation who presents with concerns for hallucination.  Reports she has a history of the same.  Reports that she is seeing moth and things flying around that are changing colors based on the walls.  This has been going on for 3 weeks.  She normally takes Haldol and this takes care of it for her; however she states that it is not working.  She has not had any recent medication changes.  She does see an outpatient psychiatrist.  Patient reports suicidal ideation as well.  She states that she would use her insulin to overdose.  She does state that she feels safer here in the emergency department.  Home Medications Prior to Admission medications   Medication Sig Start Date End Date Taking? Authorizing Provider  ACCU-CHEK GUIDE test strip USE TO CHECK BLOOD SUGAR 5 TIMES DAILY. DX E 10.65 03/08/23   Reather Littler, MD  acetaminophen (TYLENOL) 325 MG tablet Take 650 mg by mouth every 6 (six) hours as needed for mild pain.    [provider]  albuterol (VENTOLIN HFA) 108 (90 Base) MCG/ACT inhaler Inhale 1-2 puffs into the lungs every 6 (six) hours as needed for wheezing or shortness of breath.    [provider]  ALPRAZolam Prudy Feeler) 0.25 MG tablet Take 0.25 mg by mouth every 6 (six) hours as needed for anxiety or sleep. 04/11/21   [provider]  amLODipine (NORVASC) 2.5 MG tablet Take 2.5 mg by mouth daily. 12/04/22   [provider]  aspirin EC 81 MG tablet Take 81 mg by mouth at bedtime.    [provider]  atorvastatin (LIPITOR) 80 MG tablet Take 80 mg by mouth at bedtime. 12/28/18   [provider]  buPROPion (WELLBUTRIN XL) 300 MG 24 hr  tablet Take 300 mg by mouth at bedtime. 09/21/19   [provider]  clopidogrel (PLAVIX) 75 MG tablet Take 75 mg by mouth daily. 01/15/19   [provider]  Glucagon (GVOKE HYPOPEN 1-PACK) 1 MG/0.2ML SOAJ Inject 1 mg into the skin as needed (low blood sugar with impaired consciousness). 07/06/23   Motwani, Carin Hock, MD  Glucagon (GVOKE HYPOPEN 2-PACK) 1 MG/0.2ML SOAJ Inject 1 mg into the skin as needed. For severe lows Patient taking differently: Inject 1 mg into the skin as needed (low blood sugar). For severe lows 04/26/20   Reather Littler, MD  haloperidol (HALDOL) 5 MG tablet Take 5 mg by mouth 2 (two) times daily.    [provider]  HYDROcodone-acetaminophen (NORCO/VICODIN) 5-325 MG tablet Take 1 tablet by mouth every 8 (eight) hours as needed for moderate pain. 09/20/20   [provider]  insulin aspart (NOVOLOG FLEXPEN) 100 UNIT/ML FlexPen USE UP TO 15 UNITS BEFORE MEALS THREE TIMES DAILY 07/06/23   Motwani, Carin Hock, MD  insulin degludec (TRESIBA FLEXTOUCH) 100 UNIT/ML FlexTouch Pen Inject 15 Units into the skin daily. 07/06/23   Motwani, Carin Hock, MD  Insulin Pen Needle (EASY COMFORT PEN NEEDLES) 31G X 8 MM MISC Check sugar 3x  daily Patient taking differently: Check sugar 3x  daily 08/12/20   Reather Littler, MD  isosorbide dinitrate (ISORDIL) 20 MG  tablet Take 20 mg by mouth 2 (two) times daily. 01/27/19   [provider]  Latanoprostene Bunod (VYZULTA) 0.024 % SOLN Apply 1 drop to eye at bedtime.    [provider]  levothyroxine (SYNTHROID) 50 MCG tablet Take 50 mcg by mouth daily before breakfast.  02/02/19   [provider]  loratadine (CLARITIN) 10 MG tablet Take 10 mg by mouth daily as needed for allergies.    [provider]  losartan (COZAAR) 25 MG tablet Take 12.5 mg by mouth daily.    [provider]  methocarbamol (ROBAXIN-750) 750 MG tablet Take 1 tablet (750 mg total) by mouth every 6 (six) hours as needed for muscle  spasms. 08/23/20   Haddix, Gillie Manners, MD  metoCLOPramide (REGLAN) 5 MG tablet Take 1 tablet (5 mg total) by mouth 4 (four) times daily -  before meals and at bedtime. 12/04/20   Osvaldo Shipper, MD  metoprolol tartrate (LOPRESSOR) 25 MG tablet Take 25 mg by mouth daily. 10/25/19   [provider]  Netarsudil-Latanoprost (ROCKLATAN) 0.02-0.005 % SOLN Place 1 drop into the left eye at bedtime.    [provider]  pantoprazole (PROTONIX) 40 MG tablet Take 40 mg by mouth at bedtime. 12/29/18   [provider]  patiromer Lelon Perla) 8.4 g packet Take 8.4 g by mouth every other day.    [provider]  Polyethyl Glycol-Propyl Glycol (SYSTANE) 0.4-0.3 % GEL ophthalmic gel Place 1 application. into both eyes 3 (three) times daily as needed (dry/irritated eyes.).    [provider]  pregabalin (LYRICA) 75 MG capsule Take 75 mg by mouth 2 (two) times daily. 02/11/19   [provider]  ranolazine (RANEXA) 500 MG 12 hr tablet TAKE 1 TABLET BY MOUTH TWICE A DAY 08/25/21   Georgeanna Lea, MD  senna-docusate (SENOKOT-S) 8.6-50 MG tablet Take 2 tablets by mouth 2 (two) times daily. 12/04/20   Osvaldo Shipper, MD  sertraline (ZOLOFT) 100 MG tablet Take 100 mg by mouth at bedtime. 01/27/19   [provider]  sodium zirconium cyclosilicate (LOKELMA) 10 g PACK packet Take 10 g by mouth See admin instructions. MON and FRI    [provider]  TRULICITY 1.5 MG/0.5ML SOPN Inject 1.5 mg into the skin every Monday.  02/07/19   [provider]      Allergies    Patient has no known allergies.    Review of Systems   Review of Systems  Constitutional:  Negative for fever.  Psychiatric/Behavioral:  Positive for hallucinations and suicidal ideas.   All other systems reviewed and are negative.   Physical Exam Updated Vital Signs BP (!) 166/80 (BP Location: Left Arm)   Pulse 94   Temp 98.5 F (36.9 C) (Oral)   Resp 18   LMP  (LMP Unknown)  Comment: hysterectomy 2014  SpO2 96%  Physical Exam Vitals and nursing note reviewed.  Constitutional:      Appearance: She is well-developed. She is obese. She is not ill-appearing.  HENT:     Head: Normocephalic and atraumatic.  Eyes:     Pupils: Pupils are equal, round, and reactive to light.  Cardiovascular:     Rate and Rhythm: Normal rate and regular rhythm.  Pulmonary:     Effort: Pulmonary effort is normal. No respiratory distress.  Abdominal:     Palpations: Abdomen is soft.  Musculoskeletal:     Cervical back: Neck supple.  Skin:    General: Skin is warm and dry.  Neurological:     Mental Status: She is alert and oriented to person, place, and time.  Psychiatric:     Comments: Hallucinations     ED Results / Procedures / Treatments   Labs (all labs ordered are listed, but only abnormal results are displayed) Labs Reviewed  COMPREHENSIVE METABOLIC PANEL - Abnormal; Notable for the following components:      Result Value   Glucose, Bld 270 (*)    BUN 24 (*)    Creatinine, Ser 2.07 (*)    Calcium 8.8 (*)    GFR, Estimated 28 (*)    All other components within normal limits  ETHANOL  CBC WITH DIFFERENTIAL/PLATELET  RAPID URINE DRUG SCREEN, HOSP PERFORMED    EKG EKG Interpretation Date/Time:  Monday August 09 2023 18:29:43 EDT Ventricular Rate:  82 PR Interval:  150 QRS Duration:  76 QT Interval:  378 QTC Calculation: 441 R Axis:   -13  Text Interpretation: Normal sinus rhythm Cannot rule out Anterior infarct , age undetermined Abnormal ECG When compared with ECG of 04-Aug-2021 14:29, PREVIOUS ECG IS PRESENT Confirmed by Ross Marcus ([id number]) on 08/09/2023 8:59:31 PM  Radiology No results found.  Procedures Procedures    Medications Ordered in ED Medications - No data to display  ED Course/ Medical Decision Making/ A&P                                 Medical Decision Making  This patient presents to the ED for concern of  hallucinations, SI, this involves an extensive number of treatment options, and is a complaint that carries with it a high risk of complications and morbidity.  I considered the following differential and admission for this acute, potentially life threatening condition.  The differential diagnosis includes psychosis, organic medical condition  MDM:    This a 52 year old female who presents with concern for worsening hallucinations.  She is nontoxic.  Vital signs notable for blood pressure 166/80.  She is not seen frequently for her psychosis.  She does report that she has been managed previously with Haldol.  In addition to hallucinations, she reports suicidal ideation.  For this reason we will have TTS evaluate the patient.  She is voluntary at this time.  Lab work obtained and at the patient's baseline.  She is medically cleared for TTS evaluation.  (Labs, imaging, consults)  Labs: I Ordered, and personally interpreted labs.  The pertinent results include: CBC, CMP, ethanol  Imaging Studies ordered: I ordered imaging studies including none I independently visualized and interpreted imaging. I agree with the radiologist interpretation  Additional history obtained from chart review.  External records from outside source obtained and reviewed including outpatient records  Cardiac Monitoring: The patient was not maintained on a cardiac monitor.  If on the cardiac monitor, I personally viewed and interpreted the cardiac monitored which showed an underlying rhythm of: N/A  Reevaluation: After the interventions noted above, I reevaluated the patient and found that they have :stayed the same  Social Determinants of Health:  lives independently  Disposition: Pending TTS  Co morbidities that complicate the patient evaluation  Past Medical History:  Diagnosis Date   Acute kidney injury superimposed on CKD (HCC) 03/15/2019   Acute respiratory failure with hypoxia (HCC) 03/15/2019   Adjustment  disorder with mixed anxiety and depressed mood 03/11/2019   Anemia    Anxiety    Arthritis    right ankle  Blind    right eye   CHF (congestive heart failure) (HCC)    Closed right pilon fracture, initial encounter 07/26/2019   COPD (chronic obstructive pulmonary disease) (HCC)    Coronary artery disease    Depression    Diabetes mellitus type 2 in obese 07/26/2019   Diabetes mellitus without complication (HCC)    type 1   Diabetic polyneuropathy (HCC) 05/04/2016   Dyslipidemia 01/03/2020   Fever 03/10/2019   GERD (gastroesophageal reflux disease)    History of blood transfusion    HLD (hyperlipidemia)    Hyperglycemia    Hypertension    Hypothyroidism    Ileus, postoperative (HCC)    Major depression, recurrent (HCC) 03/09/2019   MDD (major depressive disorder), severe (HCC) 03/09/2019   Myocardial infarct, old    2020, s/p DES RCA, RPDA 10/23/19   Neck pain 04/21/2016   Pneumonia 10/2019   Renal disorder    stage 3   Sepsis (HCC) 03/10/2019   Short-term memory loss    mild   Sleep apnea    study done 9/27 and 08/13/20 - no results yet 08/21/20   Stroke (HCC)    Suicidal ideation 03/15/2019   Type 1 diabetes mellitus without complication (HCC) 03/15/2019   Wound infection after surgery (Right ankle) 10/04/2019     Medicines No orders of the defined types were placed in this encounter.   I have reviewed the patients home medicines and have made adjustments as needed  Problem List / ED Course: Problem List Items Addressed This Visit   None               Final Clinical Impression(s) / ED Diagnoses Final diagnoses:  None    Rx / DC Orders ED Discharge Orders     None         Shon Baton, MD 08/09/23 2136

## 2023-08-09 NOTE — ED Triage Notes (Signed)
Patient BIB [facility] for visual hallucinations x 3 weeks, patient only mentioned it today. Patient has hx of psychosis and hallucinations with SI, seen here for same. Patient reports that she has been taking her meds as ordered and endorses thoughts of self harm "if she can't stop seeing these bugs".

## 2023-08-09 NOTE — ED Notes (Signed)
Spoke with Dr. Wilkie Aye if the patient needs to be changed out to hospital scrubs, Dr. Einar Pheasant, she always has VH and she denies SI to me, I am ordering TTS for her to be evaluated."

## 2023-08-10 ENCOUNTER — Encounter (HOSPITAL_COMMUNITY): Payer: Self-pay | Admitting: Nurse Practitioner

## 2023-08-10 ENCOUNTER — Inpatient Hospital Stay (HOSPITAL_COMMUNITY)
Admission: AD | Admit: 2023-08-10 | Discharge: 2023-08-20 | DRG: 885 | Disposition: A | Discharge status: Home / Self Care | Payer: Medicare HMO | Source: Intra-hospital | Attending: Psychiatry | Admitting: Psychiatry

## 2023-08-10 DIAGNOSIS — H548 Legal blindness, as defined in USA: Secondary | ICD-10-CM | POA: Diagnosis present

## 2023-08-10 DIAGNOSIS — Z79890 Hormone replacement therapy (postmenopausal): Secondary | ICD-10-CM

## 2023-08-10 DIAGNOSIS — Z823 Family history of stroke: Secondary | ICD-10-CM | POA: Diagnosis not present

## 2023-08-10 DIAGNOSIS — F251 Schizoaffective disorder, depressive type: Secondary | ICD-10-CM | POA: Diagnosis not present

## 2023-08-10 DIAGNOSIS — F4310 Post-traumatic stress disorder, unspecified: Secondary | ICD-10-CM | POA: Diagnosis present

## 2023-08-10 DIAGNOSIS — H409 Unspecified glaucoma: Secondary | ICD-10-CM | POA: Diagnosis present

## 2023-08-10 DIAGNOSIS — I10 Essential (primary) hypertension: Secondary | ICD-10-CM | POA: Diagnosis present

## 2023-08-10 DIAGNOSIS — E039 Hypothyroidism, unspecified: Secondary | ICD-10-CM | POA: Diagnosis present

## 2023-08-10 DIAGNOSIS — K219 Gastro-esophageal reflux disease without esophagitis: Secondary | ICD-10-CM | POA: Diagnosis present

## 2023-08-10 DIAGNOSIS — I252 Old myocardial infarction: Secondary | ICD-10-CM | POA: Diagnosis not present

## 2023-08-10 DIAGNOSIS — Z7902 Long term (current) use of antithrombotics/antiplatelets: Secondary | ICD-10-CM

## 2023-08-10 DIAGNOSIS — F411 Generalized anxiety disorder: Secondary | ICD-10-CM | POA: Diagnosis present

## 2023-08-10 DIAGNOSIS — F259 Schizoaffective disorder, unspecified: Secondary | ICD-10-CM | POA: Diagnosis not present

## 2023-08-10 DIAGNOSIS — R45851 Suicidal ideations: Secondary | ICD-10-CM | POA: Diagnosis present

## 2023-08-10 DIAGNOSIS — Z9151 Personal history of suicidal behavior: Secondary | ICD-10-CM

## 2023-08-10 DIAGNOSIS — Z981 Arthrodesis status: Secondary | ICD-10-CM | POA: Diagnosis not present

## 2023-08-10 DIAGNOSIS — Z8673 Personal history of transient ischemic attack (TIA), and cerebral infarction without residual deficits: Secondary | ICD-10-CM | POA: Diagnosis not present

## 2023-08-10 DIAGNOSIS — Z794 Long term (current) use of insulin: Secondary | ICD-10-CM

## 2023-08-10 DIAGNOSIS — E119 Type 2 diabetes mellitus without complications: Secondary | ICD-10-CM

## 2023-08-10 DIAGNOSIS — Z833 Family history of diabetes mellitus: Secondary | ICD-10-CM

## 2023-08-10 DIAGNOSIS — Z8249 Family history of ischemic heart disease and other diseases of the circulatory system: Secondary | ICD-10-CM

## 2023-08-10 DIAGNOSIS — Z7985 Long-term (current) use of injectable non-insulin antidiabetic drugs: Secondary | ICD-10-CM

## 2023-08-10 DIAGNOSIS — K5900 Constipation, unspecified: Secondary | ICD-10-CM | POA: Diagnosis present

## 2023-08-10 DIAGNOSIS — G4700 Insomnia, unspecified: Secondary | ICD-10-CM | POA: Diagnosis present

## 2023-08-10 DIAGNOSIS — Z955 Presence of coronary angioplasty implant and graft: Secondary | ICD-10-CM

## 2023-08-10 DIAGNOSIS — J449 Chronic obstructive pulmonary disease, unspecified: Secondary | ICD-10-CM | POA: Diagnosis present

## 2023-08-10 DIAGNOSIS — H5410 Blindness, one eye, low vision other eye, unspecified eyes: Secondary | ICD-10-CM | POA: Diagnosis present

## 2023-08-10 DIAGNOSIS — E785 Hyperlipidemia, unspecified: Secondary | ICD-10-CM | POA: Diagnosis present

## 2023-08-10 DIAGNOSIS — Z7982 Long term (current) use of aspirin: Secondary | ICD-10-CM | POA: Diagnosis not present

## 2023-08-10 DIAGNOSIS — F250 Schizoaffective disorder, bipolar type: Secondary | ICD-10-CM | POA: Diagnosis present

## 2023-08-10 DIAGNOSIS — Z79899 Other long term (current) drug therapy: Secondary | ICD-10-CM

## 2023-08-10 DIAGNOSIS — R443 Hallucinations, unspecified: Secondary | ICD-10-CM | POA: Diagnosis not present

## 2023-08-10 DIAGNOSIS — E109 Type 1 diabetes mellitus without complications: Secondary | ICD-10-CM | POA: Diagnosis present

## 2023-08-10 DIAGNOSIS — I249 Acute ischemic heart disease, unspecified: Secondary | ICD-10-CM | POA: Diagnosis present

## 2023-08-10 LAB — RAPID URINE DRUG SCREEN, HOSP PERFORMED
Amphetamines: NOT DETECTED
Barbiturates: NOT DETECTED
Benzodiazepines: POSITIVE — AB
Cocaine: NOT DETECTED
Opiates: NOT DETECTED
Tetrahydrocannabinol: NOT DETECTED

## 2023-08-10 LAB — CBG MONITORING, ED
Glucose-Capillary: 214 mg/dL — ABNORMAL HIGH (ref 70–99)
Glucose-Capillary: 173 mg/dL — ABNORMAL HIGH (ref 70–99)

## 2023-08-10 LAB — GLUCOSE, CAPILLARY: Glucose-Capillary: 80 mg/dL (ref 70–99)

## 2023-08-10 MED ORDER — ASPIRIN 81 MG PO TBEC
81.0000 mg | DELAYED_RELEASE_TABLET | Freq: Every day | ORAL | Status: DC
  Administered 2023-08-10: 81 mg via ORAL
  Filled 2023-08-10: qty 1

## 2023-08-10 MED ORDER — LORAZEPAM 1 MG PO TABS: 2.0000 mg | ORAL_TABLET | Freq: Three times a day (TID) | ORAL | Status: DC | PRN

## 2023-08-10 MED ORDER — CLOPIDOGREL BISULFATE 75 MG PO TABS
75.0000 mg | ORAL_TABLET | Freq: Every day | ORAL | Status: DC
  Administered 2023-08-11 – 2023-08-20 (×10): 75 mg via ORAL
  Filled 2023-08-10 (×13): qty 1

## 2023-08-10 MED ORDER — PANTOPRAZOLE SODIUM 40 MG PO TBEC
40.0000 mg | DELAYED_RELEASE_TABLET | Freq: Every day | ORAL | Status: DC
  Administered 2023-08-10 – 2023-08-19 (×10): 40 mg via ORAL
  Filled 2023-08-10 (×13): qty 1

## 2023-08-10 MED ORDER — BUPROPION HCL ER (XL) 300 MG PO TB24
300.0000 mg | ORAL_TABLET | Freq: Every day | ORAL | Status: DC
  Administered 2023-08-10 – 2023-08-14 (×5): 300 mg via ORAL
  Filled 2023-08-10 (×7): qty 1

## 2023-08-10 MED ORDER — LORAZEPAM 2 MG/ML IJ SOLN: 2.0000 mg | Freq: Three times a day (TID) | INTRAMUSCULAR | Status: DC | PRN

## 2023-08-10 MED ORDER — LATANOPROSTENE BUNOD 0.024 % OP SOLN: 1.0000 [drp] | Freq: Every day | OPHTHALMIC | Status: DC

## 2023-08-10 MED ORDER — CLOPIDOGREL BISULFATE 75 MG PO TABS
75.0000 mg | ORAL_TABLET | Freq: Every day | ORAL | Status: DC
  Administered 2023-08-10: 75 mg via ORAL
  Filled 2023-08-10: qty 1

## 2023-08-10 MED ORDER — ISOSORBIDE DINITRATE 20 MG PO TABS
20.0000 mg | ORAL_TABLET | Freq: Two times a day (BID) | ORAL | Status: DC
  Administered 2023-08-10 (×2): 20 mg via ORAL
  Filled 2023-08-10: qty 1
  Filled 2023-08-10: qty 2
  Filled 2023-08-10: qty 1
  Filled 2023-08-10: qty 2
  Filled 2023-08-10: qty 1

## 2023-08-10 MED ORDER — INSULIN GLARGINE-YFGN 100 UNIT/ML ~~LOC~~ SOLN
15.0000 [IU] | Freq: Every day | SUBCUTANEOUS | Status: DC
  Administered 2023-08-11 – 2023-08-12 (×2): 15 [IU] via SUBCUTANEOUS

## 2023-08-10 MED ORDER — LATANOPROST 0.005 % OP SOLN
1.0000 [drp] | Freq: Every day | OPHTHALMIC | Status: DC
  Filled 2023-08-10: qty 2.5

## 2023-08-10 MED ORDER — HALOPERIDOL LACTATE 5 MG/ML IJ SOLN: 5.0000 mg | Freq: Three times a day (TID) | INTRAMUSCULAR | Status: DC | PRN

## 2023-08-10 MED ORDER — METOPROLOL TARTRATE 25 MG PO TABS
25.0000 mg | ORAL_TABLET | Freq: Every day | ORAL | Status: DC
  Administered 2023-08-12: 25 mg via ORAL
  Filled 2023-08-10 (×5): qty 1

## 2023-08-10 MED ORDER — LATANOPROST 0.005 % OP SOLN
1.0000 [drp] | Freq: Every day | OPHTHALMIC | Status: DC
  Administered 2023-08-10 – 2023-08-19 (×10): 1 [drp] via OPHTHALMIC
  Filled 2023-08-10 (×2): qty 2.5

## 2023-08-10 MED ORDER — AMLODIPINE BESYLATE 2.5 MG PO TABS
2.5000 mg | ORAL_TABLET | Freq: Every day | ORAL | Status: DC
  Administered 2023-08-12 – 2023-08-15 (×2): 2.5 mg via ORAL
  Filled 2023-08-10 (×6): qty 1

## 2023-08-10 MED ORDER — OLANZAPINE 5 MG PO TBDP
5.0000 mg | ORAL_TABLET | Freq: Three times a day (TID) | ORAL | Status: DC | PRN
  Administered 2023-08-10: 5 mg via ORAL
  Filled 2023-08-10: qty 1

## 2023-08-10 MED ORDER — RANOLAZINE ER 500 MG PO TB12
500.0000 mg | ORAL_TABLET | Freq: Two times a day (BID) | ORAL | Status: DC
  Administered 2023-08-10 (×2): 500 mg via ORAL
  Filled 2023-08-10 (×3): qty 1

## 2023-08-10 MED ORDER — INSULIN DEGLUDEC 100 UNIT/ML ~~LOC~~ SOPN: 15.0000 [IU] | PEN_INJECTOR | Freq: Every day | SUBCUTANEOUS | Status: DC

## 2023-08-10 MED ORDER — HALOPERIDOL 5 MG PO TABS
5.0000 mg | ORAL_TABLET | Freq: Two times a day (BID) | ORAL | Status: DC
  Administered 2023-08-10 (×2): 5 mg via ORAL
  Filled 2023-08-10 (×2): qty 1

## 2023-08-10 MED ORDER — LEVOTHYROXINE SODIUM 50 MCG PO TABS
50.0000 ug | ORAL_TABLET | Freq: Every day | ORAL | Status: DC
  Administered 2023-08-11 – 2023-08-20 (×10): 50 ug via ORAL
  Filled 2023-08-10 (×4): qty 1
  Filled 2023-08-10: qty 2
  Filled 2023-08-10 (×2): qty 1
  Filled 2023-08-10 (×2): qty 2
  Filled 2023-08-10 (×6): qty 1

## 2023-08-10 MED ORDER — DULAGLUTIDE 1.5 MG/0.5ML ~~LOC~~ SOAJ: 1.5000 mg | SUBCUTANEOUS | Status: DC

## 2023-08-10 MED ORDER — PREGABALIN 75 MG PO CAPS
75.0000 mg | ORAL_CAPSULE | Freq: Two times a day (BID) | ORAL | Status: DC
  Administered 2023-08-10 – 2023-08-20 (×20): 75 mg via ORAL
  Filled 2023-08-10 (×20): qty 1

## 2023-08-10 MED ORDER — TRAZODONE HCL 50 MG PO TABS
50.0000 mg | ORAL_TABLET | Freq: Every evening | ORAL | Status: DC | PRN
  Administered 2023-08-14 – 2023-08-19 (×3): 50 mg via ORAL
  Filled 2023-08-10 (×5): qty 1

## 2023-08-10 MED ORDER — ISOSORBIDE DINITRATE 20 MG PO TABS
20.0000 mg | ORAL_TABLET | Freq: Two times a day (BID) | ORAL | Status: DC
  Administered 2023-08-12 – 2023-08-15 (×4): 20 mg via ORAL
  Filled 2023-08-10 (×10): qty 1
  Filled 2023-08-10: qty 2
  Filled 2023-08-10 (×4): qty 1
  Filled 2023-08-10: qty 2

## 2023-08-10 MED ORDER — LEVOTHYROXINE SODIUM 50 MCG PO TABS
50.0000 ug | ORAL_TABLET | Freq: Every day | ORAL | Status: DC
  Administered 2023-08-10: 50 ug via ORAL
  Filled 2023-08-10: qty 1

## 2023-08-10 MED ORDER — HALOPERIDOL 5 MG PO TABS
5.0000 mg | ORAL_TABLET | Freq: Two times a day (BID) | ORAL | Status: DC
  Administered 2023-08-10 – 2023-08-11 (×2): 5 mg via ORAL
  Filled 2023-08-10 (×5): qty 1

## 2023-08-10 MED ORDER — SERTRALINE HCL 100 MG PO TABS
100.0000 mg | ORAL_TABLET | Freq: Every day | ORAL | Status: DC
  Administered 2023-08-10 – 2023-08-16 (×7): 100 mg via ORAL
  Filled 2023-08-10 (×10): qty 1

## 2023-08-10 MED ORDER — INSULIN GLARGINE-YFGN 100 UNIT/ML ~~LOC~~ SOLN
15.0000 [IU] | Freq: Every day | SUBCUTANEOUS | Status: DC
  Administered 2023-08-10: 15 [IU] via SUBCUTANEOUS
  Filled 2023-08-10: qty 0.15

## 2023-08-10 MED ORDER — AMLODIPINE BESYLATE 5 MG PO TABS
2.5000 mg | ORAL_TABLET | Freq: Every day | ORAL | Status: DC
  Administered 2023-08-10: 2.5 mg via ORAL
  Filled 2023-08-10: qty 1

## 2023-08-10 MED ORDER — METOPROLOL TARTRATE 25 MG PO TABS
25.0000 mg | ORAL_TABLET | Freq: Every day | ORAL | Status: DC
  Administered 2023-08-10: 25 mg via ORAL
  Filled 2023-08-10: qty 1

## 2023-08-10 MED ORDER — RANOLAZINE ER 500 MG PO TB12
500.0000 mg | ORAL_TABLET | Freq: Two times a day (BID) | ORAL | Status: DC
  Administered 2023-08-11 – 2023-08-20 (×19): 500 mg via ORAL
  Filled 2023-08-10 (×27): qty 1

## 2023-08-10 MED ORDER — ALUM & MAG HYDROXIDE-SIMETH 200-200-20 MG/5ML PO SUSP: 30.0000 mL | ORAL | Status: DC | PRN

## 2023-08-10 MED ORDER — ASPIRIN 81 MG PO TBEC
81.0000 mg | DELAYED_RELEASE_TABLET | Freq: Every day | ORAL | Status: DC
  Administered 2023-08-10 – 2023-08-19 (×10): 81 mg via ORAL
  Filled 2023-08-10 (×13): qty 1

## 2023-08-10 MED ORDER — BUPROPION HCL ER (XL) 150 MG PO TB24
300.0000 mg | ORAL_TABLET | Freq: Every day | ORAL | Status: DC
  Administered 2023-08-10: 300 mg via ORAL
  Filled 2023-08-10: qty 1

## 2023-08-10 MED ORDER — ATORVASTATIN CALCIUM 80 MG PO TABS
80.0000 mg | ORAL_TABLET | Freq: Every day | ORAL | Status: DC
  Administered 2023-08-10 – 2023-08-19 (×10): 80 mg via ORAL
  Filled 2023-08-10 (×9): qty 1
  Filled 2023-08-10: qty 2
  Filled 2023-08-10 (×3): qty 1

## 2023-08-10 MED ORDER — INSULIN ASPART 100 UNIT/ML IJ SOLN
0.0000 [IU] | Freq: Three times a day (TID) | INTRAMUSCULAR | Status: DC
  Administered 2023-08-11: 2 [IU] via SUBCUTANEOUS
  Administered 2023-08-11: 5 [IU] via SUBCUTANEOUS
  Administered 2023-08-12: 3 [IU] via SUBCUTANEOUS
  Administered 2023-08-12: 2 [IU] via SUBCUTANEOUS
  Administered 2023-08-13 (×2): 3 [IU] via SUBCUTANEOUS
  Administered 2023-08-13: 2 [IU] via SUBCUTANEOUS
  Administered 2023-08-14: 3 [IU] via SUBCUTANEOUS
  Administered 2023-08-14: 1 [IU] via SUBCUTANEOUS
  Administered 2023-08-14 – 2023-08-15 (×2): 2 [IU] via SUBCUTANEOUS
  Administered 2023-08-15: 7 [IU] via SUBCUTANEOUS
  Administered 2023-08-15: 3 [IU] via SUBCUTANEOUS
  Administered 2023-08-16: 5 [IU] via SUBCUTANEOUS
  Administered 2023-08-16: 7 [IU] via SUBCUTANEOUS
  Administered 2023-08-16: 2 [IU] via SUBCUTANEOUS
  Administered 2023-08-17 (×2): 7 [IU] via SUBCUTANEOUS
  Administered 2023-08-17 – 2023-08-18 (×2): 3 [IU] via SUBCUTANEOUS
  Administered 2023-08-18: 1 [IU] via SUBCUTANEOUS
  Administered 2023-08-18: 9 [IU] via SUBCUTANEOUS
  Administered 2023-08-19: 3 [IU] via SUBCUTANEOUS
  Administered 2023-08-19: 1 [IU] via SUBCUTANEOUS
  Administered 2023-08-19: 3 [IU] via SUBCUTANEOUS
  Administered 2023-08-20: 5 [IU] via SUBCUTANEOUS
  Administered 2023-08-20: 1 [IU] via SUBCUTANEOUS

## 2023-08-10 MED ORDER — LOSARTAN POTASSIUM 25 MG PO TABS
12.5000 mg | ORAL_TABLET | Freq: Every day | ORAL | Status: DC
  Administered 2023-08-10: 12.5 mg via ORAL
  Filled 2023-08-10: qty 0.5

## 2023-08-10 MED ORDER — INSULIN ASPART 100 UNIT/ML IJ SOLN: 0.0000 [IU] | Freq: Three times a day (TID) | INTRAMUSCULAR | Status: DC

## 2023-08-10 MED ORDER — PREGABALIN 25 MG PO CAPS
75.0000 mg | ORAL_CAPSULE | Freq: Two times a day (BID) | ORAL | Status: DC
  Administered 2023-08-10 (×2): 75 mg via ORAL
  Filled 2023-08-10 (×2): qty 3

## 2023-08-10 MED ORDER — SERTRALINE HCL 100 MG PO TABS
100.0000 mg | ORAL_TABLET | Freq: Every day | ORAL | Status: DC
  Administered 2023-08-10: 100 mg via ORAL
  Filled 2023-08-10: qty 1

## 2023-08-10 MED ORDER — PANTOPRAZOLE SODIUM 40 MG PO TBEC
40.0000 mg | DELAYED_RELEASE_TABLET | Freq: Every day | ORAL | Status: DC
  Administered 2023-08-10: 40 mg via ORAL
  Filled 2023-08-10: qty 1

## 2023-08-10 MED ORDER — INSULIN ASPART 100 UNIT/ML IJ SOLN
0.0000 [IU] | Freq: Three times a day (TID) | INTRAMUSCULAR | Status: DC
  Administered 2023-08-10: 3 [IU] via SUBCUTANEOUS
  Administered 2023-08-10: 2 [IU] via SUBCUTANEOUS
  Administered 2023-08-10: 3 [IU] via SUBCUTANEOUS

## 2023-08-10 MED ORDER — HALOPERIDOL 5 MG PO TABS: 5.0000 mg | ORAL_TABLET | Freq: Three times a day (TID) | ORAL | Status: DC | PRN

## 2023-08-10 MED ORDER — ATORVASTATIN CALCIUM 80 MG PO TABS
80.0000 mg | ORAL_TABLET | Freq: Every day | ORAL | Status: DC
  Administered 2023-08-10: 80 mg via ORAL
  Filled 2023-08-10: qty 2

## 2023-08-10 MED ORDER — LOSARTAN POTASSIUM 25 MG PO TABS
12.5000 mg | ORAL_TABLET | Freq: Every day | ORAL | Status: DC
  Administered 2023-08-12 – 2023-08-13 (×2): 12.5 mg via ORAL
  Filled 2023-08-10 (×5): qty 0.5

## 2023-08-10 MED ORDER — ACETAMINOPHEN 325 MG PO TABS: 650.0000 mg | ORAL_TABLET | Freq: Four times a day (QID) | ORAL | Status: DC | PRN

## 2023-08-10 NOTE — Progress Notes (Signed)
Admission Note:  Patient is a 52 year old female who presents voluntarily from [facility] for worsening visual hallucinations over the last 3 weeks of "bugs" and SI related to [facility] symptoms. Patient has extensive medical history and history of psychosis. Patient is blind in the right eye and can only see peripherally in the left eye. Patient requires assistance with ADLs such dressing, walking, writing and eating.   Patient currently denies SI and says the last time had them was 9/22. Patient currently denies VH and says the last time had them was at [facility] today.   Patient was provided patient packet, forms were signed. Skin was assessed with [facility] and found bruise on left flank area and skinned knee. Patient states she fell when she was trying to run away from the bugs. Patient was oriented to the unit. 15 minute safety checks were initiated.

## 2023-08-10 NOTE — ED Notes (Signed)
PT assisted out to safe transport vehicle with 2 bags of belongings.

## 2023-08-10 NOTE — Tx Team (Signed)
Initial Treatment Plan 08/10/2023 5:56 PM Deanna Landry ZOX:[id number]    PATIENT STRESSORS: Other: return of visual hallucinations     PATIENT STRENGTHS: Ability for insight  Communication skills  Motivation for treatment/growth    PATIENT IDENTIFIED PROBLEMS: "I was trying to get away from the buggies"                     DISCHARGE CRITERIA:  Ability to meet basic life and health needs Improved stabilization in mood, thinking, and/or behavior Motivation to continue treatment in a less acute level of care  PRELIMINARY DISCHARGE PLAN: Outpatient therapy  PATIENT/FAMILY INVOLVEMENT: This treatment plan has been presented to and reviewed with the patient, Deanna Landry.  The patient has been given the opportunity to ask questions and make suggestions.  Karn Pickler, RN 08/10/2023, 5:56 PM

## 2023-08-10 NOTE — ED Notes (Signed)
Patient changed out and security scan performed

## 2023-08-10 NOTE — Plan of Care (Signed)
Problem: Education: Goal: Knowledge of [facility] Education information/materials will improve Outcome: Progressing Goal: Emotional status will improve Outcome: Progressing Goal: Mental status will improve Outcome: Progressing Goal: Verbalization of understanding the information provided will improve Outcome: Progressing   Problem: Activity: Goal: Interest or engagement in activities will improve Outcome: Progressing

## 2023-08-10 NOTE — ED Provider Notes (Signed)
Emergency Medicine Observation Re-evaluation Note  Tim D Krolak is a 52 y.o. female, seen on rounds today.  Pt initially presented to the ED for complaints of Hallucinations Currently, the patient is awaiting TTS.  Physical Exam  BP (!) 103/58 (BP Location: Right Arm)   Pulse 96   Temp 97.8 F (36.6 C) (Oral)   Resp 15   LMP  (LMP Unknown) Comment: hysterectomy 2014  SpO2 99%  Physical Exam General: Calm. Some hallucinations today.  Cardiac: Well perfused Lungs: Even respirations Psych: Calm  ED Course / MDM  EKG:EKG Interpretation Date/Time:  Monday August 09 2023 18:29:43 EDT Ventricular Rate:  82 PR Interval:  150 QRS Duration:  76 QT Interval:  378 QTC Calculation: 441 R Axis:   -13  Text Interpretation: Normal sinus rhythm Cannot rule out Anterior infarct , age undetermined Abnormal ECG When compared with ECG of 04-Aug-2021 14:29, PREVIOUS ECG IS PRESENT Confirmed by Ross Marcus ([id number]) on 08/09/2023 8:59:31 PM  I have reviewed the labs performed to date as well as medications administered while in observation.  Recent changes in the last 24 hours include patient accepted to [facility] under Dr. Sherron Flemings. Plan for admit later this afternoon.  Plan  Current plan is for admit to [facility].    Maia Plan, MD 08/10/23 1352

## 2023-08-10 NOTE — Progress Notes (Signed)
Patient ID: Deanna Landry, female   DOB: Sep 16, 1971, 52 y.o.   MRN: [id number] [facility] Consult [facility]  [facility] Consult [facility]  Patient Name: Deanna Landry MRN: [id number] DOB: 1971/02/06 DATE OF Consult: 08/10/2023  PRIMARY PSYCHIATRIC DIAGNOSES  1.  Bipolar Depression vs. 2.  Schizoaffective Disorder, Depressed Type 3.    RECOMMENDATIONS  Recommendations: Medication recommendations: Recommend continuing patient on Haldol 5 mg bid for psychosis, as well as Wellbutrin XL, 300 mg every day for depression;  Should she need mild anxiety med, recommend hydroxyzine, 50 mg q6h PRN; if she needs for agitation/aggression, then recommend Haldol 5 mg IM q6h PRN AND Ativan 2 mg IM q6h PRN AND Benadryl 50 mg q6h PRN.  Non-Medication/therapeutic recommendations: Recommend that patient have suicide monitoring per hospital/ED protocol, and continue with matter-of-fact emotional support in ED, pending transfer Is inpatient psychiatric hospitalization recommended for this patient? Yes (Explain why): Patient was planning to overdose on her insulin this evening to stop the hallucinations, and she has attempted such an overdose before, three years ago.  Has also been so frightened by seeing the "bugs" on her food, she has not been eating the past two days, risking medical morbidity/mortality, given her Type I diabetes.  She meets criteria for emergency detention/IVC.  Patient is aware of this recommendation Follow-Up Telepsychiatry C/L services: We will sign off for now. Please re-consult our service if needed for any concerning changes in the patient's condition, discharge planning, or questions. Communication: Treatment team members (and family members if applicable) who were involved in treatment/care discussions and planning, and with whom we spoke or engaged with via secure text/chat, include the following: Notified Dr. Manus Gunning of ED Department via secure message, and spoke with Mr. Jean Rosenthal,  RN  Thank you for involving [place] in the care of this patient. If you have any additional questions or concerns, please call [phone] and ask for the provider on-call.  TELEPSYCHIATRY ATTESTATION & CONSENT  As the provider for this telehealth consult, I attest that I verified the patient's identity using two separate identifiers, introduced myself to the patient, provided my credentials, disclosed my location, and performed this encounter via a HIPAA-compliant, real-time, face-to-face, two-way, interactive audio and video platform and with the full consent and agreement of the patient (or guardian as applicable.)  Patient physical location: [facility]. Telehealth provider physical location: home office in state of [place].  Video start time: 0330h Lamount Cohen Time) Video end time: 19h Lamount Cohen Time)  IDENTIFYING DATA  Deanna Landry is a 52 y.o. year-old female for whom a psychiatric consultation has been ordered by the primary provider. The patient was identified using two separate identifiers.  CHIEF COMPLAINT/REASON FOR CONSULT   I can't keep living if these big bugs are all over me.  I can't eat.  I can't sleep.   HISTORY OF PRESENT ILLNESS (HPI)   The patient presents with a history since age 63 of psychiatric mood instability and psychosis, with multiple hospitalizations through the years, the most recent of which was at [facility] 3 years ago, after a suicide attempt by self-injection of insulin.    Patient has chronic visual hallucinations of insects crawling around her, but she states that periodically they become quite intense, leading her to "panic" and do acts such as self-injecting with insulin.  Patient reports that generally her meds (Haldol and Wellbutrin) keep the worst of the sx's under control, although they are ever gone.  However, over the past month or so,  she has had another dramatic exacerbation of her hallucinations, such that over the past few days she is feeling  "swarmed" by large "bugs"  and "moths" that are "crawling over my food," leading her not to eat and complicating her diabetes regimen.  Patient has not been able to sleep because she feels them crawling over her face, and today patient thought again of using insulin to kill herself to stop the "bugs."  She told her mother, who contacted authorities, and patient brought to ED. Patient does feel safer in ED, and she has been able to eat some, but she still was feeling terrified by all the "bugs" that she saw while she was trying to rest in the hallway.    Patient has not seen a psychiatrist since she left [facility] three years ago:  her meds have been prescribed by her family MD.  AS above, Haldol/Wellbutrin had been working adequately until the past few weeks.  Patient denies using EtOH/drugs.  No auditory hallucinations.  No homicidal ideation. Marland Kitchen  PAST PSYCHIATRIC HISTORY   Otherwise as per HPI above.   PAST MEDICAL HISTORY  Past Medical History:  Diagnosis Date   Acute kidney injury superimposed on CKD (HCC) 03/15/2019   Acute respiratory failure with hypoxia (HCC) 03/15/2019   Adjustment disorder with mixed anxiety and depressed mood 03/11/2019   Anemia    Anxiety    Arthritis    right ankle   Blind    right eye   CHF (congestive heart failure) (HCC)    Closed right pilon fracture, initial encounter 07/26/2019   COPD (chronic obstructive pulmonary disease) (HCC)    Coronary artery disease    Depression    Diabetes mellitus type 2 in obese 07/26/2019   Diabetes mellitus without complication (HCC)    type 1   Diabetic polyneuropathy (HCC) 05/04/2016   Dyslipidemia 01/03/2020   Fever 03/10/2019   GERD (gastroesophageal reflux disease)    History of blood transfusion    HLD (hyperlipidemia)    Hyperglycemia    Hypertension    Hypothyroidism    Ileus, postoperative (HCC)    Major depression, recurrent (HCC) 03/09/2019   MDD (major depressive disorder), severe (HCC) 03/09/2019   Myocardial  infarct, old    2020, s/p DES RCA, RPDA 10/23/19   Neck pain 04/21/2016   Pneumonia 10/2019   Renal disorder    stage 3   Sepsis (HCC) 03/10/2019   Short-term memory loss    mild   Sleep apnea    study done 9/27 and 08/13/20 - no results yet 08/21/20   Stroke (HCC)    Suicidal ideation 03/15/2019   Type 1 diabetes mellitus without complication (HCC) 03/15/2019   Wound infection after surgery (Right ankle) 10/04/2019     HOME MEDICATIONS  Facility Ordered Medications  Medication   [COMPLETED] ALPRAZolam (XANAX) tablet 0.25 mg   amLODipine (NORVASC) tablet 2.5 mg   aspirin EC tablet 81 mg   atorvastatin (LIPITOR) tablet 80 mg   buPROPion (WELLBUTRIN XL) 24 hr tablet 300 mg   clopidogrel (PLAVIX) tablet 75 mg   haloperidol (HALDOL) tablet 5 mg   isosorbide dinitrate (ISORDIL) tablet 20 mg   levothyroxine (SYNTHROID) tablet 50 mcg   losartan (COZAAR) tablet 12.5 mg   sertraline (ZOLOFT) tablet 100 mg   pregabalin (LYRICA) capsule 75 mg   latanoprost (XALATAN) 0.005 % ophthalmic solution 1 drop   metoprolol tartrate (LOPRESSOR) tablet 25 mg   pantoprazole (PROTONIX) EC tablet 40 mg  ranolazine (RANEXA) 12 hr tablet 500 mg   insulin aspart (novoLOG) injection 0-9 Units   insulin glargine-yfgn (SEMGLEE) injection 15 Units   PTA Medications  Medication Sig   atorvastatin (LIPITOR) 80 MG tablet Take 80 mg by mouth daily.   levothyroxine (SYNTHROID) 50 MCG tablet Take 50 mcg by mouth daily before breakfast.    pregabalin (LYRICA) 75 MG capsule Take 75 mg by mouth 2 (two) times daily.   clopidogrel (PLAVIX) 75 MG tablet Take 75 mg by mouth at bedtime.   pantoprazole (PROTONIX) 40 MG tablet Take 40 mg by mouth daily.   aspirin EC 81 MG tablet Take 81 mg by mouth daily.   Polyethyl Glycol-Propyl Glycol (SYSTANE) 0.4-0.3 % GEL ophthalmic gel Place 1 application. into both eyes 3 (three) times daily as needed (dry/irritated eyes.).   buPROPion (WELLBUTRIN XL) 300 MG 24 hr tablet Take 300 mg  by mouth daily.   albuterol (VENTOLIN HFA) 108 (90 Base) MCG/ACT inhaler Inhale 1-2 puffs into the lungs every 6 (six) hours as needed for wheezing or shortness of breath.   loratadine (CLARITIN) 10 MG tablet Take 10 mg by mouth daily as needed for allergies.   HYDROcodone-acetaminophen (NORCO/VICODIN) 5-325 MG tablet Take 1 tablet by mouth every 8 (eight) hours as needed for moderate pain.   senna-docusate (SENOKOT-S) 8.6-50 MG tablet Take 2 tablets by mouth 2 (two) times daily.   ALPRAZolam (XANAX) 0.25 MG tablet Take 0.25 mg by mouth every 6 (six) hours as needed for anxiety or sleep.   acetaminophen (TYLENOL) 325 MG tablet Take 650 mg by mouth every 6 (six) hours as needed for mild pain.   sodium zirconium cyclosilicate (LOKELMA) 10 g PACK packet Take 10 g by mouth See admin instructions. MON and FRI   haloperidol (HALDOL) 5 MG tablet Take 5 mg by mouth 2 (two) times daily.   losartan (COZAAR) 25 MG tablet Take 25 mg by mouth daily.   Latanoprostene Bunod (VYZULTA) 0.024 % SOLN Place 1 drop into the left eye at bedtime.   amLODipine (NORVASC) 2.5 MG tablet Take 2.5 mg by mouth daily.   Glucagon (GVOKE HYPOPEN 1-PACK) 1 MG/0.2ML SOAJ Inject 1 mg into the skin as needed (low blood sugar with impaired consciousness).   insulin degludec (TRESIBA FLEXTOUCH) 100 UNIT/ML FlexTouch Pen Inject 15 Units into the skin daily.   insulin aspart (NOVOLOG FLEXPEN) 100 UNIT/ML FlexPen USE UP TO 15 UNITS BEFORE MEALS THREE TIMES DAILY   OZEMPIC, 1 MG/DOSE, 4 MG/3ML SOPN Inject 1 mg into the skin once a week. wednesdays   Glucagon (GVOKE HYPOPEN 2-PACK) 1 MG/0.2ML SOAJ Inject 1 mg into the skin as needed. For severe lows (Patient taking differently: Inject 1 mg into the skin as needed (low blood sugar). For severe lows)   Insulin Pen Needle (EASY COMFORT PEN NEEDLES) 31G X 8 MM MISC Check sugar 3x  daily (Patient taking differently: Check sugar 3x  daily)   patiromer (VELTASSA) 8.4 g packet Take 8.4 g by mouth  every other day. (Patient not taking: Reported on 08/10/2023)   ACCU-CHEK GUIDE test strip USE TO CHECK BLOOD SUGAR 5 TIMES DAILY. DX E 10.65   doxycycline (VIBRAMYCIN) 100 MG capsule Take 100 mg by mouth 2 (two) times daily. (Patient not taking: Reported on 08/10/2023)     ALLERGIES  No Known Allergies  SOCIAL & SUBSTANCE USE HISTORY  Social History   Socioeconomic History   Marital status: Divorced    Spouse name: Not on file  Number of children: Not on file   Years of education: Not on file   Highest education level: Not on file  Occupational History   Not on file  Tobacco Use   Smoking status: Never   Smokeless tobacco: Never  Vaping Use   Vaping status: Never Used  Substance and Sexual Activity   Alcohol use: Not Currently   Drug use: Not Currently   Sexual activity: Not on file    Comment: Hysterectomy  Other Topics Concern   Not on file  Social History Narrative   Not on file   Social Determinants of Health   Financial Resource Strain: Not on file  Food Insecurity: Not on file  Transportation Needs: No Transportation Needs (08/31/2019)   PRAPARE - Administrator, Civil Service (Medical): No    Lack of Transportation (Non-Medical): No  Physical Activity: Not on file  Stress: Not on file  Social Connections: Not on file   Social History   Tobacco Use  Smoking Status Never  Smokeless Tobacco Never   Social History   Substance and Sexual Activity  Alcohol Use Not Currently   Social History   Substance and Sexual Activity  Drug Use Not Currently    Additional pertinent information Lives with her mother.  Has three children who stay in touch with her.  On disability x 10 years. Marland Kitchen  FAMILY HISTORY  Family History  Problem Relation Age of Onset   Diabetes Mellitus II Mother    Stroke Mother    Heart attack Mother    Stomach cancer Father    Congestive Heart Failure Father    Diabetes Sister    Congestive Heart Failure Maternal  Grandfather    Family Psychiatric History (if known):  Patient's sister has attempted overdose when younger.    MENTAL STATUS EXAM (MSE)  Presentation  General Appearance:  Fairly Groomed  Eye Contact: Fair  Speech: Slow  Speech Volume: Decreased  Handedness:No data recorded  Mood and Affect  Mood: Anxious; Depressed; Hopeless  Affect: Depressed; Tearful; Flat   Thought Process  Thought Processes: Goal Directed  Descriptions of Associations: Loose  Orientation: Full (Time, Place and Person)  Thought Content: Rumination; Scattered  History of Schizophrenia/Schizoaffective disorder:No data recorded Duration of Psychotic Symptoms:No data recorded Hallucinations: Hallucinations: Visual Description of Visual Hallucinations: seeing bugs (large) and moths constantly swarming around her  Ideas of Reference: None  Suicidal Thoughts: Suicidal Thoughts: Yes, Active SI Active Intent and/or Plan: With Intent; With Plan; With Means to Carry Out; With Access to Means  Homicidal Thoughts: Homicidal Thoughts: No   Sensorium  Memory: Immediate Fair; Recent Fair; Remote Fair  Judgment: Fair  Insight: Fair   Executive Functions  Concentration: Poor  Attention Span: Poor  Recall: [employer] of Knowledge: Fair  Language: Fair   Psychomotor Activity  Psychomotor Activity: Psychomotor Activity: Decreased   Assets  Assets: Communication Skills; Social Support; Housing   Sleep  Sleep: Sleep: Poor   VITALS  Blood pressure (!) 161/70, pulse 92, temperature 98.2 F (36.8 C), temperature source Oral, resp. rate 16, SpO2 99%.  LABS  Admission on 08/09/2023  Component Date Value Ref Range Status   Sodium 08/09/2023 137  135 - 145 mmol/L Final   Potassium 08/09/2023 4.7  3.5 - 5.1 mmol/L Final   Chloride 08/09/2023 101  98 - 111 mmol/L Final   CO2 08/09/2023 26  22 - 32 mmol/L Final   Glucose, Bld 08/09/2023 270 (H)  70 -  99 mg/dL Final    Glucose reference range applies only to samples taken after fasting for at least 8 hours.   BUN 08/09/2023 24 (H)  6 - 20 mg/dL Final   Creatinine, Ser 08/09/2023 2.07 (H)  0.44 - 1.00 mg/dL Final   Calcium 40/98/1191 8.8 (L)  8.9 - 10.3 mg/dL Final   Total Protein 47/82/9562 7.0  6.5 - 8.1 g/dL Final   Albumin 13/06/6577 3.7  3.5 - 5.0 g/dL Final   AST 46/96/2952 18  15 - 41 U/L Final   ALT 08/09/2023 18  0 - 44 U/L Final   Alkaline Phosphatase 08/09/2023 115  38 - 126 U/L Final   Total Bilirubin 08/09/2023 0.5  0.3 - 1.2 mg/dL Final   GFR, Estimated 08/09/2023 28 (L)  >60 mL/min Final   Comment: (NOTE) Calculated using the CKD-EPI Creatinine Equation (2021)    Anion gap 08/09/2023 10  5 - 15 Final   Performed at [facility], [address], [place], [place] [address]   Alcohol, Ethyl (B) 08/09/2023 <10  <10 mg/dL Final   Comment: (NOTE) Lowest detectable limit for serum alcohol is 10 mg/dL.  For medical purposes only. Performed at [facility], [address], [place], [place] [address]    Opiates 08/09/2023 NONE DETECTED  NONE DETECTED Final   Cocaine 08/09/2023 NONE DETECTED  NONE DETECTED Final   Benzodiazepines 08/09/2023 POSITIVE (A)  NONE DETECTED Final   Amphetamines 08/09/2023 NONE DETECTED  NONE DETECTED Final   Tetrahydrocannabinol 08/09/2023 NONE DETECTED  NONE DETECTED Final   Barbiturates 08/09/2023 NONE DETECTED  NONE DETECTED Final   Comment: (NOTE) DRUG SCREEN FOR MEDICAL PURPOSES ONLY.  IF CONFIRMATION IS NEEDED FOR ANY PURPOSE, NOTIFY LAB WITHIN 5 DAYS.  LOWEST DETECTABLE LIMITS FOR URINE DRUG SCREEN Drug Class                     Cutoff (ng/mL) Amphetamine and metabolites    1000 Barbiturate and metabolites    200 Benzodiazepine                 200 Opiates and metabolites        300 Cocaine and metabolites        300 THC                            50 Performed at [facility], [address], [place], [place] [address]    WBC  08/09/2023 4.4  4.0 - 10.5 K/uL Final   RBC 08/09/2023 4.44  3.87 - 5.11 MIL/uL Final   Hemoglobin 08/09/2023 12.7  12.0 - 15.0 g/dL Final   HCT 66/44/0347 39.0  36.0 - 46.0 % Final   MCV 08/09/2023 87.8  80.0 - 100.0 fL Final   MCH 08/09/2023 28.6  26.0 - 34.0 pg Final   MCHC 08/09/2023 32.6  30.0 - 36.0 g/dL Final   RDW 42/59/5638 13.5  11.5 - 15.5 % Final   Platelets 08/09/2023 171  150 - 400 K/uL Final   nRBC 08/09/2023 0.0  0.0 - 0.2 % Final   Neutrophils Relative % 08/09/2023 68  % Final   Neutro Abs 08/09/2023 3.1  1.7 - 7.7 K/uL Final   Lymphocytes Relative 08/09/2023 22  % Final   Lymphs Abs 08/09/2023 1.0  0.7 - 4.0 K/uL Final   Monocytes Relative 08/09/2023 7  % Final   Monocytes Absolute 08/09/2023 0.3  0.1 - 1.0 K/uL Final   Eosinophils Relative 08/09/2023 1  % Final   Eosinophils Absolute 08/09/2023 0.0  0.0 - 0.5 K/uL Final   Basophils Relative 08/09/2023 1  % Final   Basophils Absolute 08/09/2023 0.0  0.0 - 0.1 K/uL Final   Immature Granulocytes 08/09/2023 1  % Final   Abs Immature Granulocytes 08/09/2023 0.02  0.00 - 0.07 K/uL Final   Performed at [facility], [address], [place], [place] [address]   Glucose-Capillary 08/09/2023 217 (H)  70 - 99 mg/dL Final   Glucose reference range applies only to samples taken after fasting for at least 8 hours.    PSYCHIATRIC REVIEW OF SYSTEMS (ROS)  ROS: Notable for the following relevant positive findings: Review of Systems  Psychiatric/Behavioral:  Positive for depression, hallucinations and suicidal ideas. The patient is nervous/anxious and has insomnia.   All other systems reviewed and are negative.   Additional findings:      Musculoskeletal: No abnormal movements observed      Gait & Station: Laying/Sitting      Pain Screening: Denies        RISK FORMULATION/ASSESSMENT  Is the patient experiencing any suicidal or homicidal ideations: Yes       Explain if yes: Patient had thoughts of using her insulin to  kill herself and stop the "bugs"  Protective factors considered for safety management:   Patient feels unable to control herself, and family unable to keep patient safe  Risk factors/concerns considered for safety management:  Prior attempt Family history of suicide Depression Physical illness/chronic pain Access to lethal means Hopelessness Impulsivity Isolation Barriers to accessing treatment Unmarried  Is there a safety management plan with the patient and treatment team to minimize risk factors and promote protective factors: No           Explain: Family unable to provide adequate support at home Is crisis care placement or psychiatric hospitalization recommended: Yes     Based on my current evaluation and risk assessment, patient is determined at this time to be at:  High risk  *RISK ASSESSMENT Risk assessment is a dynamic process; it is possible that this patient's condition, and risk level, may change. This should be re-evaluated and managed over time as appropriate. Please re-consult psychiatric consult services if additional assistance is needed in terms of risk assessment and management. If your team decides to discharge this patient, please advise the patient how to best access emergency psychiatric services, or to call 911, if their condition worsens or they feel unsafe in any way.   Ezekiel Slocumb, MD Telepsychiatry Consult Services

## 2023-08-10 NOTE — Progress Notes (Signed)
Pt has been accepted to [facility] TODAY 08/10/2023. Bed assignment: 403-2  Pt meets inpatient criteria per Eligha Bridegroom, NP  Attending Physician will be Phineas Inches, MD  Report can be called to: - Adult unit: [phone]  Pt can arrive after pending discharges  Care Team Notified: [facility] Rona Ravens, RN, Eligha Bridegroom, NP, and Lorella Nimrod, RN  [place], [place]  08/10/2023 10:32 AM

## 2023-08-10 NOTE — ED Notes (Signed)
Patient moved to Rm 5 in ER for TTS, for privacy, TTS monitor in room with patient awaiting call.

## 2023-08-10 NOTE — Progress Notes (Signed)
Nursing 1:1 Note   D: Pt was visible in the dayroom interacting with peers. Pt was noted to have an unsteady gait while ambulating. Required the assistance of two staff members to assist with moving in bed and sitting up. Medications given as prescribed. Pt is laying in bedroom resting. Respirations noted even and unlabored. Pt is not in distress.   A: Water and snack offered. Emotional support and availability provided. Minimizing stimuli. Offered rest.  Maintaining 1: 1 for safety.  R: Pt remains laying in bed awake. Receptive to care interventions at this time. Remains safe on 1:1.

## 2023-08-10 NOTE — ED Notes (Signed)
Oral fluids and bag lunch provided, patient resting in bed awake

## 2023-08-11 ENCOUNTER — Encounter (HOSPITAL_COMMUNITY): Payer: Self-pay

## 2023-08-11 LAB — GLUCOSE, CAPILLARY
Glucose-Capillary: 110 mg/dL — ABNORMAL HIGH (ref 70–99)
Glucose-Capillary: 279 mg/dL — ABNORMAL HIGH (ref 70–99)
Glucose-Capillary: 168 mg/dL — ABNORMAL HIGH (ref 70–99)
Glucose-Capillary: 139 mg/dL — ABNORMAL HIGH (ref 70–99)

## 2023-08-11 MED ORDER — HALOPERIDOL 5 MG PO TABS
7.5000 mg | ORAL_TABLET | Freq: Two times a day (BID) | ORAL | Status: DC
  Administered 2023-08-11 – 2023-08-15 (×8): 7.5 mg via ORAL
  Filled 2023-08-11: qty 2
  Filled 2023-08-11 (×9): qty 1.5
  Filled 2023-08-11: qty 2
  Filled 2023-08-11: qty 1.5
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
## 2023-08-11 NOTE — [facility] Group Notes (Signed)
Adult Psychoeducational Group Note  Date:  08/11/2023 Time:  3:58 AM  Group Topic/Focus:  Wrap-Up Group:   The focus of this group is to help patients review their daily goal of treatment and discuss progress on daily workbooks.  Participation Level:  Active  Participation Quality:  Appropriate  Affect:  Appropriate  Cognitive:  Appropriate  Insight: Appropriate  Engagement in Group:  Engaged  Modes of Intervention:  Support  Additional Comments:  Pt attend group. Pt did not share wrap up sheet.   Satira Anis 08/11/2023, 3:58 AM
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
## 2023-08-11 NOTE — Plan of Care (Signed)
  Problem: Safety: Goal: Periods of time without injury will increase Outcome: Progressing   

## 2023-08-11 NOTE — Progress Notes (Addendum)
Patient ID: Deanna Landry, female   DOB: 07-22-1971, 52 y.o.   MRN: [id number] Pt remains on 1.1. due to fall risk and visual impairement . Deanna Landry is unsteady on her feet and remains 1.1. with staff at bedside. Pt is safe, will con't to monitor.

## 2023-08-11 NOTE — Progress Notes (Signed)
Pt did not attend NA group 

## 2023-08-11 NOTE — Plan of Care (Signed)

## 2023-08-11 NOTE — Progress Notes (Addendum)
Pt remains on 1.1. due to fall risk and visual impairement . Deanna Landry is unsteady on her feet and remains 1.1. with staff at bedside. Pt is safe, will con't to monitor.

## 2023-08-11 NOTE — Progress Notes (Signed)
Patient ID: Deanna Landry, female   DOB: [birth year], 52 y.o.   MRN: [id number] Pt remains on 1.1. for safety. 1.1. remains at arms length. Pt is safe, resting quietly currently with MHT at bedside. Pt remains high fall risk due to unsteady gait and visual impairment. WIll con't to monitor.

## 2023-08-11 NOTE — Progress Notes (Signed)
Nursing 1:1 note D:Pt observed sleeping in bed with eyes closed. RR even and unlabored. No distress noted. A: 1:1 observation continues for safety  R: Pt remains safe

## 2023-08-11 NOTE — [facility] Group Notes (Signed)

## 2023-08-11 NOTE — Group Note (Signed)
Recreation Therapy Group Note   Group Topic:Communication  Group Date: 08/11/2023 Start Time: 0932 End Time: 1005 Facilitators: Liliah Dorian-McCall, LRT,CTRS Location: [address] Dayroom   Group Topic: Communication, Problem Solving   Goal Area(s) Addresses:  Patient will effectively listen to complete activity.  Patient will identify communication skills used to make activity successful.  Patient will identify how skills used during activity can be used to reach post d/c goals.    Intervention: Building surveyor Activity - Geometric pattern cards, pencils, blank paper    Group Description: Geometric Drawings.  Three volunteers from the peer group will be shown an abstract picture with a particular arrangement of geometrical shapes.  Each round, one 'speaker' will describe the pattern, as accurately as possible without revealing the image to the group.  The remaining group members will listen and draw the picture to reflect how it is described to them. Patients with the role of 'listener' cannot ask clarifying questions but, may request that the speaker repeat a direction. Once the drawings are complete, the presenter will show the rest of the group the picture and compare how close each person came to drawing the picture. LRT will facilitate a post-activity discussion regarding effective communication and the importance of planning, listening, and asking for clarification in daily interactions with others.  Education: Environmental consultant, Active listening, Support systems, Discharge planning  Education Outcome: Acknowledges understanding/In group clarification offered/Needs additional education.    Affect/Mood: N/A   Participation Level: Did not attend    Clinical Observations/Individualized Feedback:     Plan: Continue to engage patient in RT group sessions 2-3x/week.   Anikka Marsan-McCall, LRT,CTRS 08/11/2023 1:03 PM

## 2023-08-11 NOTE — Progress Notes (Signed)
   08/11/23 0556  15 Minute Checks  Location Bedroom  Visual Appearance Calm  Behavior Sleeping  Sleep (Behavioral Health Patients Only)  Calculate sleep? (Click Yes once per 24 hr at 0600 safety check) Yes  Documented sleep last 24 hours 7.75

## 2023-08-11 NOTE — Progress Notes (Addendum)
Patient ID: Deanna Landry, female   DOB: 1971-06-14, 52 y.o.   MRN: [id number] Assumed care of patient at 0730. Patient upon receiving report was noted to have drastic orthostatic hypotension. Pt approached and reports she is having dizziness at times and per previous RN unsteady gait. 1.1. at bedside and pt approached in bathroom in which she reports '' yes I don't feel the best but the nurse gave me gatorade and I'm feeling a little better. '' Deanna Landry denies any SI HI but continues to endorse VH stating '' I'm still seeing weird things. '' Due to concern in BP am antihypertensives held. Again Clinical research associate encouraged po fluids and additional gatorade and water provided. 1.1. staff remains at bedside and patient reminded of her high fall risk status and safety precautions.  Above discussed in am treatment team with MD NP and SW to include reason for am medications being held.  The patient was given her self inventory form and encouraged to complete with the assistance of the MHT at bedside. She rates her depression and hopelessness at 10 /10 on scale 10 being worst 0 being none. Deanna Landry has been compliant with medications, isolative to her room thus far this shift. Will con't to monitor closely.
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
## 2023-08-11 NOTE — Progress Notes (Signed)
Nursing 1:1 Note:  Pt is laying in bed asleep. Respirations are even and unlabored. No signs of distress. 1:1 continued for pt safety. Safety maintained.

## 2023-08-11 NOTE — [facility] Group Notes (Signed)
Psychoeducational Group Note  Date:  08/11/2023 Time:  8:30  Group Topic/Focus:  Goals Group:   The focus of this group is to help patients establish daily goals to achieve during treatment and discuss how the patient can incorporate goal setting into their daily lives to aide in recovery.  Participation Level: Did Not Attend  Participation Quality:  Not Applicable  Affect:  Not Applicable  Cognitive:  Not Applicable  Insight:  Not Applicable  Engagement in Group: Not Applicable  Additional Comments:  Pt did not attend goal group.  Sanjith Siwek, Sharen Counter 08/11/2023, 10:37 AM

## 2023-08-11 NOTE — BH IP Treatment Plan (Signed)
Interdisciplinary Treatment and Diagnostic Plan Initial  08/11/2023 Time of Session: 0950 Deanna Landry MRN: [id number]  Principal Diagnosis: Schizoaffective disorder [facility])  Secondary Diagnoses: Principal Problem:   Schizoaffective disorder (HCC)   Current Medications:  Current Facility-Administered Medications  Medication Dose Route Frequency Provider Last Rate Last Admin   acetaminophen (TYLENOL) tablet 650 mg  650 mg Oral Q6H PRN Eligha Bridegroom, NP       alum & mag hydroxide-simeth (MAALOX/MYLANTA) 200-200-20 MG/5ML suspension 30 mL  30 mL Oral Q4H PRN Eligha Bridegroom, NP       amLODipine (NORVASC) tablet 2.5 mg  2.5 mg Oral Daily Eligha Bridegroom, NP       aspirin EC tablet 81 mg  81 mg Oral QHS Eligha Bridegroom, NP   81 mg at 08/10/23 2148   atorvastatin (LIPITOR) tablet 80 mg  80 mg Oral QHS Eligha Bridegroom, NP   80 mg at 08/10/23 2148   buPROPion (WELLBUTRIN XL) 24 hr tablet 300 mg  300 mg Oral QHS Eligha Bridegroom, NP   300 mg at 08/10/23 2148   clopidogrel (PLAVIX) tablet 75 mg  75 mg Oral Daily Eligha Bridegroom, NP   75 mg at 08/11/23 0819   haloperidol (HALDOL) tablet 5 mg  5 mg Oral BID Eligha Bridegroom, NP   5 mg at 08/11/23 8295   haloperidol (HALDOL) tablet 5 mg  5 mg Oral TID PRN Eligha Bridegroom, NP       Or   haloperidol lactate (HALDOL) injection 5 mg  5 mg Intramuscular TID PRN Eligha Bridegroom, NP       insulin aspart (novoLOG) injection 0-9 Units  0-9 Units Subcutaneous TID WC Eligha Bridegroom, NP       insulin glargine-yfgn (SEMGLEE) injection 15 Units  15 Units Subcutaneous Daily Eligha Bridegroom, NP   15 Units at 08/11/23 0819   isosorbide dinitrate (ISORDIL) tablet 20 mg  20 mg Oral BID Eligha Bridegroom, NP       latanoprost (XALATAN) 0.005 % ophthalmic solution 1 drop  1 drop Left Eye QHS Eligha Bridegroom, NP   1 drop at 08/10/23 2148   levothyroxine (SYNTHROID) tablet 50 mcg  50 mcg Oral QAC breakfast Eligha Bridegroom, NP   50 mcg at 08/11/23 6213    LORazepam (ATIVAN) tablet 2 mg  2 mg Oral TID PRN Eligha Bridegroom, NP       Or   LORazepam (ATIVAN) injection 2 mg  2 mg Intramuscular TID PRN Eligha Bridegroom, NP       losartan (COZAAR) tablet 12.5 mg  12.5 mg Oral Daily Eligha Bridegroom, NP       metoprolol tartrate (LOPRESSOR) tablet 25 mg  25 mg Oral Daily Eligha Bridegroom, NP       pantoprazole (PROTONIX) EC tablet 40 mg  40 mg Oral QHS Eligha Bridegroom, NP   40 mg at 08/10/23 2148   pregabalin (LYRICA) capsule 75 mg  75 mg Oral BID Eligha Bridegroom, NP   75 mg at 08/11/23 0820   ranolazine (RANEXA) 12 hr tablet 500 mg  500 mg Oral BID Eligha Bridegroom, NP   500 mg at 08/11/23 0865   sertraline (ZOLOFT) tablet 100 mg  100 mg Oral QHS Eligha Bridegroom, NP   100 mg at 08/10/23 2148   traZODone (DESYREL) tablet 50 mg  50 mg Oral QHS PRN Sindy Guadeloupe, NP       PTA Medications: Medications Prior to Admission  Medication Sig Dispense Refill Last Dose   ACCU-CHEK GUIDE test strip  USE TO CHECK BLOOD SUGAR 5 TIMES DAILY. DX E 10.65 300 strip 2    acetaminophen (TYLENOL) 325 MG tablet Take 650 mg by mouth every 6 (six) hours as needed for mild pain.      albuterol (VENTOLIN HFA) 108 (90 Base) MCG/ACT inhaler Inhale 1-2 puffs into the lungs every 6 (six) hours as needed for wheezing or shortness of breath.      ALPRAZolam (XANAX) 0.25 MG tablet Take 0.25 mg by mouth every 6 (six) hours as needed for anxiety or sleep.      amLODipine (NORVASC) 2.5 MG tablet Take 2.5 mg by mouth daily.      aspirin EC 81 MG tablet Take 81 mg by mouth daily.      atorvastatin (LIPITOR) 80 MG tablet Take 80 mg by mouth daily.      buPROPion (WELLBUTRIN XL) 300 MG 24 hr tablet Take 300 mg by mouth daily.      clopidogrel (PLAVIX) 75 MG tablet Take 75 mg by mouth at bedtime.      doxycycline (VIBRAMYCIN) 100 MG capsule Take 100 mg by mouth 2 (two) times daily. (Patient not taking: Reported on 08/10/2023)      Glucagon (GVOKE HYPOPEN 1-PACK) 1 MG/0.2ML SOAJ Inject 1 mg  into the skin as needed (low blood sugar with impaired consciousness). 0.4 mL 2    Glucagon (GVOKE HYPOPEN 2-PACK) 1 MG/0.2ML SOAJ Inject 1 mg into the skin as needed. For severe lows (Patient taking differently: Inject 1 mg into the skin as needed (low blood sugar). For severe lows) 0.4 mL 3    haloperidol (HALDOL) 5 MG tablet Take 5 mg by mouth 2 (two) times daily.      HYDROcodone-acetaminophen (NORCO/VICODIN) 5-325 MG tablet Take 1 tablet by mouth every 8 (eight) hours as needed for moderate pain.      insulin aspart (NOVOLOG FLEXPEN) 100 UNIT/ML FlexPen USE UP TO 15 UNITS BEFORE MEALS THREE TIMES DAILY 15 mL 3    insulin degludec (TRESIBA FLEXTOUCH) 100 UNIT/ML FlexTouch Pen Inject 15 Units into the skin daily. 15 mL 3    Insulin Pen Needle (EASY COMFORT PEN NEEDLES) 31G X 8 MM MISC Check sugar 3x  daily (Patient taking differently: Check sugar 3x  daily) 100 each 2    Latanoprostene Bunod (VYZULTA) 0.024 % SOLN Place 1 drop into the left eye at bedtime.      levothyroxine (SYNTHROID) 50 MCG tablet Take 50 mcg by mouth daily before breakfast.       loratadine (CLARITIN) 10 MG tablet Take 10 mg by mouth daily as needed for allergies.      losartan (COZAAR) 25 MG tablet Take 25 mg by mouth daily.      OZEMPIC, 1 MG/DOSE, 4 MG/3ML SOPN Inject 1 mg into the skin once a week. wednesdays      pantoprazole (PROTONIX) 40 MG tablet Take 40 mg by mouth daily.      patiromer (VELTASSA) 8.4 g packet Take 8.4 g by mouth every other day. (Patient not taking: Reported on 08/10/2023)      Polyethyl Glycol-Propyl Glycol (SYSTANE) 0.4-0.3 % GEL ophthalmic gel Place 1 application. into both eyes 3 (three) times daily as needed (dry/irritated eyes.).      pregabalin (LYRICA) 75 MG capsule Take 75 mg by mouth 2 (two) times daily.      senna-docusate (SENOKOT-S) 8.6-50 MG tablet Take 2 tablets by mouth 2 (two) times daily. 60 tablet 0    sodium zirconium cyclosilicate (LOKELMA)  10 g PACK packet Take 10 g by mouth See  admin instructions. MON and FRI       Patient Stressors: Other: return of visual hallucinations    Patient Strengths: Ability for insight  Communication skills  Motivation for treatment/growth   Treatment Modalities: Medication Management, Group therapy, Case management,  1 to 1 session with clinician, Psychoeducation, Recreational therapy.   Physician Treatment Plan for Primary Diagnosis: Schizoaffective disorder (HCC) Long Term Goal(s):     Short Term Goals:    Medication Management: Evaluate patient's response, side effects, and tolerance of medication regimen.  Therapeutic Interventions: 1 to 1 sessions, Unit Group sessions and Medication administration.  Evaluation of Outcomes: Progressing  Physician Treatment Plan for Secondary Diagnosis: Principal Problem:   Schizoaffective disorder (HCC)  Long Term Goal(s):     Short Term Goals:       Medication Management: Evaluate patient's response, side effects, and tolerance of medication regimen.  Therapeutic Interventions: 1 to 1 sessions, Unit Group sessions and Medication administration.  Evaluation of Outcomes: Progressing   RN Treatment Plan for Primary Diagnosis: Schizoaffective disorder (HCC) Long Term Goal(s): Knowledge of disease and therapeutic regimen to maintain health will improve  Short Term Goals: Ability to remain free from injury will improve, Ability to verbalize frustration and anger appropriately will improve, Ability to demonstrate self-control, Ability to participate in decision making will improve, Ability to verbalize feelings will improve, Ability to disclose and discuss suicidal ideas, Ability to identify and develop effective coping behaviors will improve, and Compliance with prescribed medications will improve  Medication Management: RN will administer medications as ordered by provider, will assess and evaluate patient's response and provide education to patient for prescribed medication. RN will  report any adverse and/or side effects to prescribing provider.  Therapeutic Interventions: 1 on 1 counseling sessions, Psychoeducation, Medication administration, Evaluate responses to treatment, Monitor vital signs and CBGs as ordered, Perform/monitor CIWA, COWS, AIMS and Fall Risk screenings as ordered, Perform wound care treatments as ordered.  Evaluation of Outcomes: Progressing   LCSW Treatment Plan for Primary Diagnosis: Schizoaffective disorder (HCC) Long Term Goal(s): Safe transition to appropriate next level of care at discharge, Engage patient in therapeutic group addressing interpersonal concerns.  Short Term Goals: Engage patient in aftercare planning with referrals and resources, Increase social support, Increase ability to appropriately verbalize feelings, Increase emotional regulation, Facilitate acceptance of mental health diagnosis and concerns, Facilitate patient progression through stages of change regarding substance use diagnoses and concerns, Identify triggers associated with mental health/substance abuse issues, and Increase skills for wellness and recovery  Therapeutic Interventions: Assess for all discharge needs, 1 to 1 time with Social worker, Explore available resources and support systems, Assess for adequacy in community support network, Educate family and significant other(s) on suicide prevention, Complete Psychosocial Assessment, Interpersonal group therapy.  Evaluation of Outcomes: Progressing   Progress in Treatment: Attending groups: Yes Participating in groups: Yes. Taking medication as prescribed: Yes. Toleration medication: Yes. Family/Significant other contact made: Yes, individual(s) contacted:  Pt's Mother Patient understands diagnosis: Yes. Discussing patient identified problems/goals with staff: Yes. Medical problems stabilized or resolved: Yes. Denies suicidal/homicidal ideation: Yes. Issues/concerns per patient self-inventory: Yes. Other:  N/A  New problem(s) identified: No, Describe:  None reported  New Short Term/Long Term Goal(s): medication stabilization, elimination of SI thoughts, development of comprehensive mental wellness plan.   Patient Goals:  Medication stabilization  Discharge Plan or Barriers: :  Patient recently admitted. CSW will continue to follow and assess for appropriate referrals  and possible discharge planning.   Reason for Continuation of Hospitalization: Depression Hallucinations Medication stabilization Suicidal ideation  Estimated Length of Stay: 5-7 Days  Last 3 [place] Suicide Severity Risk Score: Flowsheet Row Ad[facility] (Current) [facility] from 08/09/2023 in [facility] Department at [facility] ED from 06/17/2022 in [facility] Emergency Department at [facility]  C-SSRS RISK CATEGORY No Risk High Risk No Risk       Last PHQ 2/9 Scores:    08/31/2019    2:42 PM  Depression screen PHQ 2/9  Decreased Interest 0  Down, Depressed, Hopeless 0  PHQ - 2 Score 0    medication stabilization, elimination of SI thoughts, development of comprehensive mental wellness plan.   Scribe for Treatment Team: Ane Payment, LCSW 08/11/2023 11:46 AM

## 2023-08-12 ENCOUNTER — Other Ambulatory Visit: Payer: Medicare HMO

## 2023-08-12 LAB — GLUCOSE, CAPILLARY
Glucose-Capillary: 55 mg/dL — ABNORMAL LOW (ref 70–99)
Glucose-Capillary: 104 mg/dL — ABNORMAL HIGH (ref 70–99)
Glucose-Capillary: 237 mg/dL — ABNORMAL HIGH (ref 70–99)
Glucose-Capillary: 206 mg/dL — ABNORMAL HIGH (ref 70–99)
Glucose-Capillary: 171 mg/dL — ABNORMAL HIGH (ref 70–99)
Glucose-Capillary: 229 mg/dL — ABNORMAL HIGH (ref 70–99)

## 2023-08-12 LAB — LIPID PANEL
Cholesterol: 100 mg/dL (ref 0–200)
HDL: 33 mg/dL — ABNORMAL LOW (ref >40)
LDL Cholesterol: 33 mg/dL (ref 0–99)
Total CHOL/HDL Ratio: 3 RATIO
Triglycerides: 168 mg/dL — ABNORMAL HIGH (ref <150)
VLDL: 34 mg/dL (ref 0–40)

## 2023-08-12 LAB — COMPREHENSIVE METABOLIC PANEL
ALT: 17 U/L (ref 0–44)
AST: 17 U/L (ref 15–41)
Albumin: 3.3 g/dL — ABNORMAL LOW (ref 3.5–5.0)
Alkaline Phosphatase: 122 U/L (ref 38–126)
Anion gap: 8 (ref 5–15)
BUN: 49 mg/dL — ABNORMAL HIGH (ref 6–20)
CO2: 26 mmol/L (ref 22–32)
Calcium: 8.8 mg/dL — ABNORMAL LOW (ref 8.9–10.3)
Chloride: 105 mmol/L (ref 98–111)
Creatinine, Ser: 2.83 mg/dL — ABNORMAL HIGH (ref 0.44–1.00)
GFR, Estimated: 20 mL/min — ABNORMAL LOW (ref >60)
Glucose, Bld: 214 mg/dL — ABNORMAL HIGH (ref 70–99)
Potassium: 4.6 mmol/L (ref 3.5–5.1)
Sodium: 139 mmol/L (ref 135–145)
Total Bilirubin: 0.5 mg/dL (ref 0.3–1.2)
Total Protein: 6.4 g/dL — ABNORMAL LOW (ref 6.5–8.1)

## 2023-08-12 MED ORDER — INSULIN GLARGINE-YFGN 100 UNIT/ML ~~LOC~~ SOLN
12.0000 [IU] | Freq: Every day | SUBCUTANEOUS | Status: DC
  Administered 2023-08-13 – 2023-08-17 (×5): 12 [IU] via SUBCUTANEOUS

## 2023-08-12 NOTE — Progress Notes (Signed)
Patient's blood sugar recheck  recorded103.

## 2023-08-12 NOTE — Progress Notes (Signed)
   08/12/23 1000  Psych Admission Type (Psych Patients Only)  Admission Status Voluntary  Psychosocial Assessment  Patient Complaints Other (Comment) (visual hallucinations)  Eye Contact Fair  Facial Expression Anxious;Sad  Affect Depressed;Anxious;Preoccupied  Speech Logical/coherent;Slow;Soft  Interaction Cautious  Motor Activity Unsteady;Slow  Appearance/Hygiene In scrubs;Disheveled  Behavior Characteristics Cooperative;Anxious  Mood Depressed;Anxious  Thought Process  Coherency WDL  Content Preoccupation  Delusions None reported or observed  Perception Hallucinations  Hallucination Visual  Judgment Limited  Confusion None  Danger to Self  Current suicidal ideation? Denies  Danger to Others  Danger to Others None reported or observed

## 2023-08-12 NOTE — Group Note (Signed)
LCSW Group Therapy Note   Group Date: 08/12/2023 Start Time: 1100 End Time: 1200   Type of Therapy and Topic: Group Therapy: Relationship Check-Ins   Participation Level: Did Not Attend   Description Group:  In this group patients are encouraged to rate how well or not well they are able to improve their relationships in Beliefs and Values, Communication, Family and Friends, Actuary and Household, and Intimacy. Patients will be able to discuss and identify what is going well within these aspects and what is not. Patients will be able to find appropriate ways, solutions, and skills that will help them within the selected relationships category. Patients will be encouraged to share and reflect on why things within their relationships are not going so well and get feedback from the instructor or their peers.  This group will be solution focused and process-oriented with patients' participation in sharing and listening to their own and peers experience; along with receive support and advice on how to improve or change the circumstance that is known to be challenging in their relationships category (Beliefs and Values, Communication, Family and Friends, Actuary and Household, and Intimacy).   Therapeutic Goals:  Patient will identify their strengths and weakness within their Beliefs and Values, Communication, Family and Friends, Actuary and Household, and Intimacy relationships. Patient will identify reasons why and how they can improve their relationships.  Patient will identify how they can be supportive and honest to themselves and in their relationships.  Patient will be able to gain support and give support to others with similar challenges.   Summary of Patient Progress  Did not attend group  Therapeutic Modalities:  Solution Focused Therapy Cognitive Behavioral Therapy  Psychodynamic Therapy  Dialectical Behavior Therapy   Izell Kimberly, LCSW 08/12/2023  12:51 PM

## 2023-08-12 NOTE — Plan of Care (Signed)

## 2023-08-12 NOTE — Progress Notes (Signed)
1:1 Note:  Patient is resting. No distress noted. Breathing is even and unlabored. MHT continues to be at bedside. 1:1 observation is ongoing for safety.

## 2023-08-12 NOTE — Progress Notes (Signed)
1:1 Note:  Patient resting comfortably in bedroom. Breathing is even and unlabored. No signs of distress noted. MHT present at bedside.

## 2023-08-12 NOTE — Inpatient Diabetes Management (Signed)
Inpatient Diabetes Program Recommendations  AACE/ADA: New Consensus Statement on Inpatient Glycemic Control (2015)  Target Ranges:  Prepandial:   less than 140 mg/dL      Peak postprandial:   less than 180 mg/dL (1-2 hours)      Critically ill patients:  140 - 180 mg/dL   Lab Results  Component Value Date   GLUCAP 237 (H) 08/12/2023   HGBA1C 6.9 (H) 06/29/2023    Review of Glycemic Control  Latest Reference Range & Units 08/11/23 16:50 08/11/23 21:49 08/12/23 06:05 08/12/23 06:33 08/12/23 08:25  Glucose-Capillary 70 - 99 mg/dL 841 (H) 324 (H) 55 (L) 104 (H) 237 (H)   Diabetes history: DM 2 Outpatient Diabetes medications:  Ozempic 1 mg weekly Tresiba 15 mg daily Novolog 15 units tid with meals  Current orders for Inpatient glycemic control:  Novolog 0-9 units tid with meals  Semglee 15 units daily  Inpatient Diabetes Program Recommendations:   Note mild low this morning.  Consider slight reduction in Semglee to 12 units daily.   Will follow.   Thanks  Beryl Meager, RN, BC-ADM Inpatient Diabetes Coordinator Pager [phone] (8a-5p)

## 2023-08-12 NOTE — Progress Notes (Signed)
   08/12/23 2140  Psych Admission Type (Psych Patients Only)  Admission Status Voluntary  Psychosocial Assessment  Patient Complaints Sadness  Eye Contact Brief  Facial Expression Anxious;Sad  Affect Depressed;Anxious  Speech Logical/coherent;Slow  Interaction Minimal  Motor Activity Unsteady;Slow  Appearance/Hygiene Disheveled  Behavior Characteristics Cooperative;Anxious  Mood Depressed;Anxious  Thought Process  Coherency WDL  Content Preoccupation  Delusions None reported or observed  Perception Hallucinations  Hallucination Visual (sees snakes on herself and on the floor)  Judgment Limited  Confusion None  Danger to Self  Current suicidal ideation? Denies  Danger to Others  Danger to Others None reported or observed   Progress note   D: Pt seen in room with sitter at bedside. Pt denies SI, HI, AH. Endorses VH of snakes on herself and on the floor. States that hallucinations began about a month ago. Doesn't recall any changes about a month ago that brought on the hallucinations. Pt rates pain  0/10. Pt rates anxiety  0/10 and depression  0/10. Was shared in report that pt was hypoglycemic this morning. Encouraged pt to have a snack before bed. Educated pt on the reasons. Pt refused. CBG = 229 this evening. No other concerns noted at this time.  A: Pt provided support and encouragement. Pt given scheduled medication as prescribed. PRNs as appropriate. Q15 min checks for safety.   R: Pt safe on the unit. Will continue to monitor.

## 2023-08-12 NOTE — Plan of Care (Signed)
Problem: Education: Goal: Knowledge of [facility] Education information/materials will improve Outcome: Progressing   Problem: Education: Goal: Mental status will improve Outcome: Progressing   Problem: Activity: Goal: Interest or engagement in activities will improve Outcome: Progressing   Problem: Activity: Goal: Sleeping patterns will improve Outcome: Progressing   Problem: Coping: Goal: Ability to verbalize frustrations and anger appropriately will improve Outcome: Progressing

## 2023-08-12 NOTE — Progress Notes (Signed)
[facility] MD Progress Note  08/12/2023 12:00 PM Reygan D Ho  MRN:  [id number] Subjective:   Deanna Landry is a 52 yr old female who presented on 9/23 to [facility] with complaints of SI due to worsening of her chronic VH, she was admitted to [facility] on 9/25. PPHx is significant for Schizoaffective Disorder Bipolar Type, Chronic Hallucinations, Anxiety, and PTSD, and 2 Suicide Attempts (last-OD on Insulin 07/2021) and 5 Prior Psychiatric Hospitalizations (last- [facility] 07/2021), and no history of Self Injurious Behavior.    Case was discussed in the multidisciplinary team. MAR was reviewed and patient was compliant with medications but her blood pressure medications where held due to low BP and dizziness.  She did not receive any PRN medications yesterday.   Psychiatric Team made the following recommendations yesterday: -Increase Haldol to 7.5 mg BID for psychosis and mood stability.  -Continue Wellbutrin XL 300 mg QHS for depression and anxiety -Continue Zoloft 100 mg QHS for depression and anxiety    On interview today patient reports she slept good last night.  She reports her appetite is doing good.  She reports no SI, HI, or AH.  She reports she continues to have VH- bugs and snakes but does report that they are improved.  She reports no Paranoia or Ideas of Reference.  She reports no issues with her medications.  She reports her mood is still low but not as low as it had been.  She reports she is still having some dizziness.  Discussed with her that due to this we would not further change her Haldol at this time and she reported understanding.  She reports no other concerns at present.   Principal Problem: Schizoaffective disorder (HCC) Diagnosis: Principal Problem:   Schizoaffective disorder (HCC)  Total Time spent with patient:  I personally spent 35 minutes on the unit in direct patient care. The direct patient care time included face-to-face time with the patient, reviewing the patient's chart,  communicating with other professionals, and coordinating care. Greater than 50% of this time was spent in counseling or coordinating care with the patient regarding goals of hospitalization, psycho-education, and discharge planning needs.   Past Psychiatric History: Schizoaffective Disorder Bipolar Type, Chronic Hallucinations, Anxiety, and PTSD, and 2 Suicide Attempts (last-OD on Insulin 07/2021) and 5 Prior Psychiatric Hospitalizations (last- [facility] 07/2021), and no history of Self Injurious Behavior.   Past Medical History:  Past Medical History:  Diagnosis Date   Acute kidney injury superimposed on CKD (HCC) 03/15/2019   Acute respiratory failure with hypoxia (HCC) 03/15/2019   Adjustment disorder with mixed anxiety and depressed mood 03/11/2019   Anemia    Anxiety    Arthritis    right ankle   Blind    right eye   CHF (congestive heart failure) (HCC)    Closed right pilon fracture, initial encounter 07/26/2019   COPD (chronic obstructive pulmonary disease) (HCC)    Coronary artery disease    Depression    Diabetes mellitus type 2 in obese 07/26/2019   Diabetes mellitus without complication (HCC)    type 1   Diabetic polyneuropathy (HCC) 05/04/2016   Dyslipidemia 01/03/2020   Fever 03/10/2019   GERD (gastroesophageal reflux disease)    History of blood transfusion    HLD (hyperlipidemia)    Hyperglycemia    Hypertension    Hypothyroidism    Ileus, postoperative (HCC)    Major depression, recurrent (HCC) 03/09/2019   MDD (major depressive disorder), severe (HCC) 03/09/2019  Myocardial infarct, old    2020, s/p DES RCA, RPDA 10/23/19   Neck pain 04/21/2016   Pneumonia 10/2019   Renal disorder    stage 3   Sepsis (HCC) 03/10/2019   Short-term memory loss    mild   Sleep apnea    study done 9/27 and 08/13/20 - no results yet 08/21/20   Stroke (HCC)    Suicidal ideation 03/15/2019   Type 1 diabetes mellitus without complication (HCC) 03/15/2019   Wound infection after surgery (Right  ankle) 10/04/2019    Past Surgical History:  Procedure Laterality Date   ABDOMINAL HYSTERECTOMY  2014   ANKLE FUSION Right 08/23/2020   Procedure: ANKLE FUSION;  Surgeon: Roby Lofts, MD;  Location: [facility] OR;  Service: Orthopedics;  Laterality: Right;   APPLICATION OF WOUND VAC Right 10/11/2019   Procedure: WOUND VAC CHANGE TO RIGHT LEG;  Surgeon: Roby Lofts, MD;  Location: [facility] OR;  Service: Orthopedics;  Laterality: Right;   CORONARY ANGIOPLASTY  10/23/2019   DES RCA, DES RPDA 10/23/19 [facility])   EYE SURGERY     HARDWARE REMOVAL Right 10/06/2019   Procedure: HARDWARE REMOVAL;  Surgeon: Roby Lofts, MD;  Location: [facility] OR;  Service: Orthopedics;  Laterality: Right;   HARDWARE REMOVAL Right 08/23/2020   Procedure: HARDWARE REMOVAL;  Surgeon: Roby Lofts, MD;  Location: [facility] OR;  Service: Orthopedics;  Laterality: Right;   I & D EXTREMITY Right 10/06/2019   Procedure: IRRIGATION AND DEBRIDEMENT EXTREMITY;  Surgeon: Roby Lofts, MD;  Location: [facility] OR;  Service: Orthopedics;  Laterality: Right;   I & D EXTREMITY Right 10/09/2019   Procedure: IRRIGATION AND DEBRIDEMENT EXTREMITY and WOUND VAC CHANGE RIGHT ANKLE;  Surgeon: Roby Lofts, MD;  Location: [facility] OR;  Service: Orthopedics;  Laterality: Right;   ORIF ANKLE FRACTURE Right 07/26/2019   Procedure: OPEN REDUCTION INTERNAL FIXATION (ORIF) ANKLE FRACTURE;  Surgeon: Roby Lofts, MD;  Location: [facility] OR;  Service: Orthopedics;  Laterality: Right;  OPEN REDUCTION INTERNAL FIXATION (ORIF) ANKLE FRACTURE    TOE AMPUTATION     Family History:  Family History  Problem Relation Age of Onset   Diabetes Mellitus II Mother    Stroke Mother    Heart attack Mother    Stomach cancer Father    Congestive Heart Failure Father    Diabetes Sister    Congestive Heart Failure Maternal Grandfather    Family Psychiatric  History:  Mother- EtOH Abuse Maternal Uncle- Suicide Attempt Cousin- Heroin Abuse  Social History:  Social History    Substance and Sexual Activity  Alcohol Use Not Currently     Social History   Substance and Sexual Activity  Drug Use Not Currently    Social History   Socioeconomic History   Marital status: Divorced    Spouse name: Not on file   Number of children: Not on file   Years of education: Not on file   Highest education level: Not on file  Occupational History   Not on file  Tobacco Use   Smoking status: Never   Smokeless tobacco: Never  Vaping Use   Vaping status: Never Used  Substance and Sexual Activity   Alcohol use: Not Currently   Drug use: Not Currently   Sexual activity: Not on file    Comment: Hysterectomy  Other Topics Concern   Not on file  Social History Narrative   Not on file   Social Determinants of Health   Financial Resource Strain:  Not on file  Food Insecurity: No Food Insecurity (08/10/2023)   Hunger Vital Sign    Worried About Running Out of Food in the Last Year: Never true    Ran Out of Food in the Last Year: Never true  Transportation Needs: No Transportation Needs (08/10/2023)   PRAPARE - Administrator, Civil Service (Medical): No    Lack of Transportation (Non-Medical): No  Physical Activity: Not on file  Stress: Not on file  Social Connections: Not on file   Additional Social History:                         Sleep: Good  Appetite:  Good  Current Medications: Current Facility-Administered Medications  Medication Dose Route Frequency Provider Last Rate Last Admin   acetaminophen (TYLENOL) tablet 650 mg  650 mg Oral Q6H PRN Eligha Bridegroom, NP       alum & mag hydroxide-simeth (MAALOX/MYLANTA) 200-200-20 MG/5ML suspension 30 mL  30 mL Oral Q4H PRN Eligha Bridegroom, NP       amLODipine (NORVASC) tablet 2.5 mg  2.5 mg Oral Daily Eligha Bridegroom, NP   2.5 mg at 08/12/23 0836   aspirin EC tablet 81 mg  81 mg Oral QHS Eligha Bridegroom, NP   81 mg at 08/11/23 2231   atorvastatin (LIPITOR) tablet 80 mg  80 mg Oral QHS  Eligha Bridegroom, NP   80 mg at 08/11/23 2231   buPROPion (WELLBUTRIN XL) 24 hr tablet 300 mg  300 mg Oral QHS Eligha Bridegroom, NP   300 mg at 08/11/23 2231   clopidogrel (PLAVIX) tablet 75 mg  75 mg Oral Daily Eligha Bridegroom, NP   75 mg at 08/12/23 0836   haloperidol (HALDOL) tablet 5 mg  5 mg Oral TID PRN Eligha Bridegroom, NP       Or   haloperidol lactate (HALDOL) injection 5 mg  5 mg Intramuscular TID PRN Eligha Bridegroom, NP       haloperidol (HALDOL) tablet 7.5 mg  7.5 mg Oral BID Lauro Franklin, MD   7.5 mg at 08/12/23 0836   insulin aspart (novoLOG) injection 0-9 Units  0-9 Units Subcutaneous TID WC Eligha Bridegroom, NP   2 Units at 08/11/23 1655   [START ON 08/13/2023] insulin glargine-yfgn (SEMGLEE) injection 12 Units  12 Units Subcutaneous Daily Lauro Franklin, MD       isosorbide dinitrate (ISORDIL) tablet 20 mg  20 mg Oral BID Eligha Bridegroom, NP   20 mg at 08/12/23 0836   latanoprost (XALATAN) 0.005 % ophthalmic solution 1 drop  1 drop Left Eye QHS Eligha Bridegroom, NP   1 drop at 08/11/23 2232   levothyroxine (SYNTHROID) tablet 50 mcg  50 mcg Oral QAC breakfast Eligha Bridegroom, NP   50 mcg at 08/12/23 0720   LORazepam (ATIVAN) tablet 2 mg  2 mg Oral TID PRN Eligha Bridegroom, NP       Or   LORazepam (ATIVAN) injection 2 mg  2 mg Intramuscular TID PRN Eligha Bridegroom, NP       losartan (COZAAR) tablet 12.5 mg  12.5 mg Oral Daily Eligha Bridegroom, NP   12.5 mg at 08/12/23 0837   metoprolol tartrate (LOPRESSOR) tablet 25 mg  25 mg Oral Daily Eligha Bridegroom, NP   25 mg at 08/12/23 0837   pantoprazole (PROTONIX) EC tablet 40 mg  40 mg Oral QHS Eligha Bridegroom, NP   40 mg at 08/11/23 2231   pregabalin (LYRICA)  capsule 75 mg  75 mg Oral BID Eligha Bridegroom, NP   75 mg at 08/12/23 0836   ranolazine (RANEXA) 12 hr tablet 500 mg  500 mg Oral BID Eligha Bridegroom, NP   500 mg at 08/12/23 0836   sertraline (ZOLOFT) tablet 100 mg  100 mg Oral QHS Eligha Bridegroom, NP   100  mg at 08/11/23 2231   traZODone (DESYREL) tablet 50 mg  50 mg Oral QHS PRN Sindy Guadeloupe, NP        Lab Results:  Results for orders placed or performed during the hospital encounter of 08/10/23 (from the past 48 hour(s))  Glucose, capillary     Status: None   Collection Time: 08/10/23  5:10 PM  Result Value Ref Range   Glucose-Capillary 80 70 - 99 mg/dL    Comment: Glucose reference range applies only to samples taken after fasting for at least 8 hours.  Glucose, capillary     Status: Abnormal   Collection Time: 08/11/23  6:23 AM  Result Value Ref Range   Glucose-Capillary 110 (H) 70 - 99 mg/dL    Comment: Glucose reference range applies only to samples taken after fasting for at least 8 hours.  Glucose, capillary     Status: Abnormal   Collection Time: 08/11/23 11:57 AM  Result Value Ref Range   Glucose-Capillary 279 (H) 70 - 99 mg/dL    Comment: Glucose reference range applies only to samples taken after fasting for at least 8 hours.  Glucose, capillary     Status: Abnormal   Collection Time: 08/11/23  4:50 PM  Result Value Ref Range   Glucose-Capillary 168 (H) 70 - 99 mg/dL    Comment: Glucose reference range applies only to samples taken after fasting for at least 8 hours.  Glucose, capillary     Status: Abnormal   Collection Time: 08/11/23  9:49 PM  Result Value Ref Range   Glucose-Capillary 139 (H) 70 - 99 mg/dL    Comment: Glucose reference range applies only to samples taken after fasting for at least 8 hours.  Glucose, capillary     Status: Abnormal   Collection Time: 08/12/23  6:05 AM  Result Value Ref Range   Glucose-Capillary 55 (L) 70 - 99 mg/dL    Comment: Glucose reference range applies only to samples taken after fasting for at least 8 hours.   Comment 1 Notify RN    Comment 2 Document in Chart   Glucose, capillary     Status: Abnormal   Collection Time: 08/12/23  6:33 AM  Result Value Ref Range   Glucose-Capillary 104 (H) 70 - 99 mg/dL    Comment: Glucose  reference range applies only to samples taken after fasting for at least 8 hours.   Comment 1 Notify RN    Comment 2 Document in Chart   Glucose, capillary     Status: Abnormal   Collection Time: 08/12/23  8:25 AM  Result Value Ref Range   Glucose-Capillary 237 (H) 70 - 99 mg/dL    Comment: Glucose reference range applies only to samples taken after fasting for at least 8 hours.    Blood Alcohol level:  Lab Results  Component Value Date   [facility] <10 08/09/2023   ETH <10 07/29/2021    Metabolic Disorder Labs: Lab Results  Component Value Date   HGBA1C 6.9 (H) 06/29/2023   MPG 151.33 11/30/2020   MPG 174.29 10/05/2019   No results found for: "PROLACTIN" Lab Results  Component Value  Date   CHOL 117 09/02/2021   TRIG 63.0 09/02/2021   HDL 46.80 09/02/2021   CHOLHDL 2 09/02/2021   VLDL 12.6 09/02/2021   LDLCALC 57 09/02/2021    Physical Findings: AIMS:  , ,  ,  ,    CIWA:    COWS:     Musculoskeletal: Strength & Muscle Tone: within normal limits Gait & Station: unsteady, blind Patient leans: N/A  Psychiatric Specialty Exam:  Presentation  General Appearance:  Appropriate for Environment  Eye Contact: Other (comment) (blind)  Speech: Clear and Coherent; Normal Rate  Speech Volume: Normal  Handedness: Right   Mood and Affect  Mood: Dysphoric  Affect: Flat   Thought Process  Thought Processes: Coherent; Goal Directed  Descriptions of Associations:Intact  Orientation:Full (Time, Place and Person)  Thought Content:Logical; WDL  History of Schizophrenia/Schizoaffective disorder:No data recorded Duration of Psychotic Symptoms:No data recorded Hallucinations:Hallucinations: Visual Description of Visual Hallucinations: seeing bugs andf snakes  Ideas of Reference:None  Suicidal Thoughts:Suicidal Thoughts: No  Homicidal Thoughts:Homicidal Thoughts: No   Sensorium  Memory: Immediate Fair; Recent  Fair  Judgment: Fair  Insight: Fair   Art therapist  Concentration: Good  Attention Span: Good  Recall: Good  Fund of Knowledge: Good  Language: Good   Psychomotor Activity  Psychomotor Activity: Psychomotor Activity: Normal   Assets  Assets: Communication Skills; Desire for Improvement; Resilience; Social Support; Housing   Sleep  Sleep: Sleep: Good Number of Hours of Sleep: 10.75    Physical Exam: Physical Exam Constitutional:      General: She is not in acute distress.    Appearance: Normal appearance. She is normal weight. She is not ill-appearing or toxic-appearing.  HENT:     Head: Normocephalic and atraumatic.  Pulmonary:     Effort: Pulmonary effort is normal.  Neurological:     Mental Status: She is alert.    Review of Systems  Respiratory:  Negative for cough and shortness of breath.   Cardiovascular:  Negative for chest pain.  Gastrointestinal:  Negative for abdominal pain, constipation, diarrhea, nausea and vomiting.  Neurological:  Positive for dizziness. Negative for weakness and headaches.   Blood pressure 99/65, pulse 71, temperature 98.9 F (37.2 C), temperature source Oral, resp. rate 15, height 5\' 2"  (1.575 m), weight 69.9 kg, SpO2 96%. Body mass index is 28.17 kg/m.   Treatment Plan Summary: Daily contact with patient to assess and evaluate symptoms and progress in treatment and Medication management  Nahia D. Kosiba is a 52 yr old female who presented on 9/23 to [facility] with complaints of SI due to worsening of her chronic VH, she was admitted to [facility] on 9/25.  PPHx is significant for Schizoaffective Disorder Bipolar Type, Chronic Hallucinations, Anxiety, and PTSD, and 2 Suicide Attempts (last-OD on Insulin 07/2021) and 5 Prior Psychiatric Hospitalizations (last- [facility] 07/2021), and no history of Self Injurious Behavior.     Amazing has responded to the increase in Haldol as her hallucinations have had some improvement.   She continues to have some dizziness.  She did have an episode of hypoglycemia this morning and the diabetes coordinator has recommended decreasing her Semglee to 12 units.  We will not make any other changes to her medications at this time.  We will continue to monitor.     Schizoaffective Disorder (r/o Bipolar Disorder): -Continue Haldol 7.5 mg BID for psychosis and mood stability. -Continue Agitation Protocol: Haldol/Ativan     Depression  Anxiety  PTSD: -Continue Wellbutrin XL 300 mg  QHS for depression and anxiety -Continue Zoloft 100 mg QHS for depression and anxiety     Diabetes: -Continue SSI -Decrease Semglee to 12 units daily     HTN: -Continue Amlodipine 2.5 mg daily -Continue Losartan 12.5 mg daily -Continue Lopressor 25 mg daily     -Continue Aspirin EC 81 mg QHS -Continue Lipitor 80 mg QHS -Continue Plavix 75 mg daily -Continue Isordil 20 mg BID -Continue Xalatan 0.005% 1 drop Left eye QHS -Continue Synthroid 50 mcg daily -Continue Protonix EC 40 mg QHS -Continue Lyrica 75 mg BID -Continue Ranolazine 500 mg BID -Continue PRN's: Tylenol, Maalox, Atarax, Milk of Magnesia, Trazodone   Lauro Franklin, MD 08/12/2023, 12:00 PM

## 2023-08-12 NOTE — Progress Notes (Signed)
  1:1 Note:  Patient resting in bed at this time. Patient remains on 1:1 observation with MHT at bedside. Respirations even and unlabored, no s/s of distress noted.

## 2023-08-12 NOTE — Progress Notes (Signed)
Nursing 1:1 note D:Pt observed laying in bed with eyes closed. RR even and unlabored. No distress noted. A: 1:1 observation continues for safety  R: Pt remains safe

## 2023-08-12 NOTE — Progress Notes (Signed)
  1:1 Note:  Patient remains on 1:1 observation for safety. Patient remains safe in her room with MHT at bedside. Patient is a high fall risk due to unsteady gait and visual impairment. No distress noted.

## 2023-08-12 NOTE — Group Note (Signed)
Date:  08/12/2023 Time:  10:56 AM  Group Topic/Focus:  Goals Group:   The focus of this group is to help patients establish daily goals to achieve during treatment and discuss how the patient can incorporate goal setting into their daily lives to aide in recovery.    Participation Level:  Active  Participation Quality:  Appropriate  Affect:  Appropriate  Cognitive:  Appropriate  Insight: Appropriate  Engagement in Group:  Engaged  Modes of Intervention:  Discussion  Additional Comments:     Reymundo Poll 08/12/2023, 10:56 AM

## 2023-08-12 NOTE — Progress Notes (Signed)
   08/12/23 9147  15 Minute Checks  Location Bedroom  Visual Appearance Calm  Behavior Composed  Sleep (Behavioral Health Patients Only)  Calculate sleep? (Click Yes once per 24 hr at 0600 safety check) Yes  Documented sleep last 24 hours 10.25

## 2023-08-12 NOTE — Progress Notes (Signed)
Patient's blood sugar was 55. Patient alert and oriented, asymptomatic, denies s/s of hypoglycemia. 4 OZ of orange juice given and patient tolerates well. No s/s of distress, plan of care ongoing.

## 2023-08-12 NOTE — [facility] Suicide Risk Assessment (Signed)
[facility] INPATIENT:  Family/Significant Other Suicide Prevention Education  Suicide Prevention Education:  Education Completed; Mora Bellman (mom) [phone], has been identified by the patient as the family member/significant other with whom the patient will be residing, and identified as the person(s) who will aid the patient in the event of a mental health crisis (suicidal ideations/suicide attempt).  With written consent from the patient, the family member/significant other has been provided the following suicide prevention education, prior to the and/or following the discharge of the patient.  The suicide prevention education provided includes the following: Suicide risk factors Suicide prevention and interventions National Suicide Hotline telephone number [facility] assessment telephone number [facility] Emergency Assistance 911 [facility] and/or Residential Mobile Crisis Unit telephone number  Request made of family/significant other to: Remove weapons (e.g., guns, rifles, knives), all items previously/currently identified as safety concern.   Remove drugs/medications (over-the-counter, prescriptions, illicit drugs), all items previously/currently identified as a safety concern.  The family member/significant other verbalizes understanding of the suicide prevention education information provided.  The family member/significant other agrees to remove the items of safety concern listed above.  Izell [place] 08/12/2023, 3:08 PM

## 2023-08-12 NOTE — Progress Notes (Signed)
1:1 Note Patient resting in bedroom. No distress noted. Respirations even and unlabored. MHT remains at bedside.

## 2023-08-12 NOTE — Progress Notes (Signed)
   08/11/23 2200  Psych Admission Type (Psych Patients Only)  Admission Status Voluntary  Psychosocial Assessment  Patient Complaints Anxiety;Depression  Eye Contact Fair  Facial Expression Anxious;Worried  Affect Anxious;Preoccupied  Systems analyst;Slow  Interaction Cautious  Motor Activity Unsteady;Slow  Appearance/Hygiene Disheveled  Behavior Characteristics Cooperative  Mood Depressed;Anxious  Thought Process  Coherency WDL  Content Preoccupation  Delusions None reported or observed  Perception Hallucinations  Hallucination Visual  Judgment Impaired  Confusion None  Danger to Self  Current suicidal ideation? Denies  Danger to Others  Danger to Others None reported or observed

## 2023-08-12 NOTE — Progress Notes (Signed)
1:1 Note: Patient resting in bed with eyes closed. Patient remains on 1:1 care for safety due to unsteady gait and impaired vision. Respirations are even and unlabored, no s/s of distress noted. MHT at bedside.

## 2023-08-13 DIAGNOSIS — F251 Schizoaffective disorder, depressive type: Secondary | ICD-10-CM | POA: Diagnosis not present

## 2023-08-13 LAB — GLUCOSE, CAPILLARY
Glucose-Capillary: 250 mg/dL — ABNORMAL HIGH (ref 70–99)
Glucose-Capillary: 173 mg/dL — ABNORMAL HIGH (ref 70–99)
Glucose-Capillary: 237 mg/dL — ABNORMAL HIGH (ref 70–99)
Glucose-Capillary: 284 mg/dL — ABNORMAL HIGH (ref 70–99)

## 2023-08-13 NOTE — Group Note (Signed)
Recreation Therapy Group Note   Group Topic:Problem Solving  Group Date: 08/13/2023 Start Time: 1012 End Time: 1036 Facilitators: Saina Waage-McCall, LRT,CTRS Location: [address] Dayroom   Group Topic: Communication, Team Building, Problem Solving  Goal Area(s) Addresses:  Patient will effectively work with peer towards shared goal.  Patient will identify skills used to make activity successful.  Patient will share challenges and verbalize solution-driven approaches used. Patient will identify how skills used during activity can be used to reach post d/c goals.   Intervention: STEM Activity   Group Description: [employer]. Patients were provided the following materials: 5 drinking straws, 5 rubber bands, 5 paper clips, 2 index cards, 2 drinking cups, and 2 toilet paper rolls. Using the provided materials patients were asked to build a launching mechanism to launch a ping pong ball across the room, approximately 10 feet. Patients were divided into teams of 3-5. Instructions required all materials be incorporated into the device, functionality of items left to the peer group's discretion.  Education: Pharmacist, community, Scientist, physiological, Air cabin crew, Building control surveyor.   Education Outcome: Acknowledges education/In group clarification offered/Needs additional education.   Affect/Mood: N/A   Participation Level: Did not attend    Clinical Observations/Individualized Feedback:     Plan: Continue to engage patient in RT group sessions 2-3x/week.   Shaiann Mcmanamon-McCall, LRT,CTRS  08/13/2023 12:55 PM

## 2023-08-13 NOTE — Group Note (Signed)
Date:  08/13/2023 Time:  9:54 PM  Group Topic/Focus:  Group Therapy; AA Meeting    Participation Level:  Did Not Attend  Participation Quality:   N/A  Affect:   N/A  Cognitive:   N/A  Insight: None  Engagement in Group:  None  Modes of Intervention:   N/A  Additional Comments:  Patient did not attend AA Meeting.  Kennieth Francois 08/13/2023, 9:54 PM

## 2023-08-13 NOTE — Progress Notes (Signed)
   08/13/23 0558  15 Minute Checks  Location Bedroom  Visual Appearance Calm  Behavior Sleeping  Sleep (Behavioral Health Patients Only)  Calculate sleep? (Click Yes once per 24 hr at 0600 safety check) Yes  Documented sleep last 24 hours 11.25

## 2023-08-13 NOTE — Progress Notes (Signed)
1:1 Note Patient is resting. Breathing is even and unlabored. No distress noted. MHT at bedsite. 1:1 observation continued

## 2023-08-13 NOTE — ED Notes (Signed)
Called [facility] d/t valuables and medications were noted to not have been retrieved at time of departure of pt from [facility]. Per [facility] staff, pt is still over there. This RN will obtain medications and valuables and get security to transport belongings to [facility].  - E. Javares Kaufhold 08/13/23 [phone]

## 2023-08-13 NOTE — Group Note (Signed)
Date:  08/13/2023 Time:  11:59 AM  Group Topic/Focus:  Goals Group:   The focus of this group is to help patients establish daily goals to achieve during treatment and discuss how the patient can incorporate goal setting into their daily lives to aide in recovery.    Participation Level:  Did Not Attend  Participation Quality:    Affect:    Cognitive:    Insight:   Engagement in Group:    Modes of Intervention:    Additional Comments:    Trendon Zaring 08/13/2023, 11:59 AM

## 2023-08-13 NOTE — Progress Notes (Signed)
Nursing 1:1 note D:Pt observed sleeping in bed with eyes closed. RR even and unlabored. No distress noted. A: 1:1 observation continues for safety  R: Pt remains safe  

## 2023-08-13 NOTE — Progress Notes (Signed)
Nursing 1:1 note D:Pt observed laying in bed with eyes open. RR even and unlabored. No distress noted. A: 1:1 observation continues for safety  R: Pt remains safe

## 2023-08-13 NOTE — Plan of Care (Signed)
  Problem: Education: Goal: Emotional status will improve Outcome: Progressing Goal: Mental status will improve Outcome: Progressing   Problem: Activity: Goal: Sleeping patterns will improve Outcome: Progressing   Problem: Health Behavior/Discharge Planning: Goal: Compliance with treatment plan for underlying cause of condition will improve Outcome: Progressing   Problem: Safety: Goal: Periods of time without injury will increase Outcome: Progressing   

## 2023-08-13 NOTE — Progress Notes (Signed)
   08/13/23 1000  Psych Admission Type (Psych Patients Only)  Admission Status Voluntary  Psychosocial Assessment  Patient Complaints Depression;Anxiety;Other (Comment) (visual hallucinations)  Eye Contact Brief  Facial Expression Anxious;Sad  Affect Depressed;Anxious;Preoccupied  Speech Logical/coherent;Soft;Slow  Interaction Guarded  Motor Activity Unsteady;Slow  Appearance/Hygiene Disheveled  Behavior Characteristics Cooperative;Anxious  Mood Depressed;Anxious  Thought Process  Coherency WDL  Content Preoccupation  Delusions None reported or observed  Perception Hallucinations  Hallucination Visual  Judgment Limited  Confusion None  Danger to Self  Current suicidal ideation? Denies  Danger to Others  Danger to Others None reported or observed

## 2023-08-13 NOTE — Group Note (Signed)
Date:  08/13/2023 Time:  12:06 PM  Group Topic/Focus:  Developing a Wellness Toolbox:   The focus of this group is to help patients develop a "wellness toolbox" with skills and strategies to promote recovery upon discharge.    Participation Level:  Did Not Attend  Participation Quality:    Affect:    Cognitive:    Insight:   Engagement in Group:    Modes of Intervention:    Additional Comments:    Senta Kantor 08/13/2023, 12:06 PM

## 2023-08-13 NOTE — Progress Notes (Signed)
[facility] MD Progress Note  08/13/2023 1:36 PM Ronnesha D Kincaid  MRN:  [id number] Subjective:   Tian D. Amerson is a 52 yr old female who presented on 9/23 to [facility] with complaints of SI due to worsening of her chronic VH, she was admitted to [facility] on 9/25. PPHx is significant for Schizoaffective Disorder Bipolar Type, Chronic Hallucinations, Anxiety, and PTSD, and 2 Suicide Attempts (last-OD on Insulin 07/2021) and 5 Prior Psychiatric Hospitalizations (last- [facility] 07/2021), and no history of Self Injurious Behavior.    Case was discussed in the multidisciplinary team. MAR was reviewed and the patient was compliant with treatment.  Staff reports that the patient is on multiple antihypertensives and continues to have unstable blood pressure which was currently noted to be low.  Her medications were held.  Psychiatric Team made the following recommendations yesterday: -Continue Haldol to 7.5 mg BID for psychosis and mood stability.  -Continue Wellbutrin XL 300 mg QHS for depression and anxiety -Continue Zoloft 100 mg QHS for depression and anxiety    On interview today patient reports she slept good last night.  She reports her appetite is doing good.  She reports no SI, HI, or AH.  She reports she continues to have ongoing symptoms of VH-still sees some bugs and snakes but does report that they are improved.  She reports no Paranoia or Ideas of Reference.  She reports no issues with her medications.  She reports her mood is still somewhat low.  She reports mild improvement in her symptoms.  She has occasional dizzy spells because of her hypotensive episodes.  She is currently on one-on-one observation.  The patient is on a total of 17 different medications and will consider holding some of the antihypertensives given her low blood pressure.  Her blood pressure today was 97/59.  Yesterday it was 90/50.     Principal Problem: Schizoaffective disorder (HCC) Diagnosis: Principal Problem:   Schizoaffective  disorder (HCC)  Total Time spent with patient:  I personally spent 35 minutes on the unit in direct patient care. The direct patient care time included face-to-face time with the patient, reviewing the patient's chart, communicating with other professionals, and coordinating care. Greater than 50% of this time was spent in counseling or coordinating care with the patient regarding goals of hospitalization, psycho-education, and discharge planning needs.   Past Psychiatric History: Schizoaffective Disorder Bipolar Type, Chronic Hallucinations, Anxiety, and PTSD, and 2 Suicide Attempts (last-OD on Insulin 07/2021) and 5 Prior Psychiatric Hospitalizations (last- [facility] 07/2021), and no history of Self Injurious Behavior.   Past Medical History:  Past Medical History:  Diagnosis Date   Acute kidney injury superimposed on CKD (HCC) 03/15/2019   Acute respiratory failure with hypoxia (HCC) 03/15/2019   Adjustment disorder with mixed anxiety and depressed mood 03/11/2019   Anemia    Anxiety    Arthritis    right ankle   Blind    right eye   CHF (congestive heart failure) (HCC)    Closed right pilon fracture, initial encounter 07/26/2019   COPD (chronic obstructive pulmonary disease) (HCC)    Coronary artery disease    Depression    Diabetes mellitus type 2 in obese 07/26/2019   Diabetes mellitus without complication (HCC)    type 1   Diabetic polyneuropathy (HCC) 05/04/2016   Dyslipidemia 01/03/2020   Fever 03/10/2019   GERD (gastroesophageal reflux disease)    History of blood transfusion    HLD (hyperlipidemia)    Hyperglycemia  Hypertension    Hypothyroidism    Ileus, postoperative (HCC)    Major depression, recurrent (HCC) 03/09/2019   MDD (major depressive disorder), severe (HCC) 03/09/2019   Myocardial infarct, old    2020, s/p DES RCA, RPDA 10/23/19   Neck pain 04/21/2016   Pneumonia 10/2019   Renal disorder    stage 3   Sepsis (HCC) 03/10/2019   Short-term memory loss    mild    Sleep apnea    study done 9/27 and 08/13/20 - no results yet 08/21/20   Stroke (HCC)    Suicidal ideation 03/15/2019   Type 1 diabetes mellitus without complication (HCC) 03/15/2019   Wound infection after surgery (Right ankle) 10/04/2019    Past Surgical History:  Procedure Laterality Date   ABDOMINAL HYSTERECTOMY  2014   ANKLE FUSION Right 08/23/2020   Procedure: ANKLE FUSION;  Surgeon: Roby Lofts, MD;  Location: [facility] OR;  Service: Orthopedics;  Laterality: Right;   APPLICATION OF WOUND VAC Right 10/11/2019   Procedure: WOUND VAC CHANGE TO RIGHT LEG;  Surgeon: Roby Lofts, MD;  Location: [facility] OR;  Service: Orthopedics;  Laterality: Right;   CORONARY ANGIOPLASTY  10/23/2019   DES RCA, DES RPDA 10/23/19 [facility])   EYE SURGERY     HARDWARE REMOVAL Right 10/06/2019   Procedure: HARDWARE REMOVAL;  Surgeon: Roby Lofts, MD;  Location: [facility] OR;  Service: Orthopedics;  Laterality: Right;   HARDWARE REMOVAL Right 08/23/2020   Procedure: HARDWARE REMOVAL;  Surgeon: Roby Lofts, MD;  Location: [facility] OR;  Service: Orthopedics;  Laterality: Right;   I & D EXTREMITY Right 10/06/2019   Procedure: IRRIGATION AND DEBRIDEMENT EXTREMITY;  Surgeon: Roby Lofts, MD;  Location: [facility] OR;  Service: Orthopedics;  Laterality: Right;   I & D EXTREMITY Right 10/09/2019   Procedure: IRRIGATION AND DEBRIDEMENT EXTREMITY and WOUND VAC CHANGE RIGHT ANKLE;  Surgeon: Roby Lofts, MD;  Location: [facility] OR;  Service: Orthopedics;  Laterality: Right;   ORIF ANKLE FRACTURE Right 07/26/2019   Procedure: OPEN REDUCTION INTERNAL FIXATION (ORIF) ANKLE FRACTURE;  Surgeon: Roby Lofts, MD;  Location: [facility] OR;  Service: Orthopedics;  Laterality: Right;  OPEN REDUCTION INTERNAL FIXATION (ORIF) ANKLE FRACTURE    TOE AMPUTATION     Family History:  Family History  Problem Relation Age of Onset   Diabetes Mellitus II Mother    Stroke Mother    Heart attack Mother    Stomach cancer Father    Congestive Heart Failure Father     Diabetes Sister    Congestive Heart Failure Maternal Grandfather    Family Psychiatric  History:  Mother- EtOH Abuse Maternal Uncle- Suicide Attempt Cousin- Heroin Abuse  Social History:  Social History   Substance and Sexual Activity  Alcohol Use Not Currently     Social History   Substance and Sexual Activity  Drug Use Not Currently    Social History   Socioeconomic History   Marital status: Divorced    Spouse name: Not on file   Number of children: Not on file   Years of education: Not on file   Highest education level: Not on file  Occupational History   Not on file  Tobacco Use   Smoking status: Never   Smokeless tobacco: Never  Vaping Use   Vaping status: Never Used  Substance and Sexual Activity   Alcohol use: Not Currently   Drug use: Not Currently   Sexual activity: Not on file    Comment:  Hysterectomy  Other Topics Concern   Not on file  Social History Narrative   Not on file   Social Determinants of Health   Financial Resource Strain: Not on file  Food Insecurity: No Food Insecurity (08/10/2023)   Hunger Vital Sign    Worried About Running Out of Food in the Last Year: Never true    Ran Out of Food in the Last Year: Never true  Transportation Needs: No Transportation Needs (08/10/2023)   PRAPARE - Administrator, Civil Service (Medical): No    Lack of Transportation (Non-Medical): No  Physical Activity: Not on file  Stress: Not on file  Social Connections: Not on file   Additional Social History:                         Sleep: Good  Appetite:  Good  Current Medications: Current Facility-Administered Medications  Medication Dose Route Frequency Provider Last Rate Last Admin   acetaminophen (TYLENOL) tablet 650 mg  650 mg Oral Q6H PRN Eligha Bridegroom, NP       alum & mag hydroxide-simeth (MAALOX/MYLANTA) 200-200-20 MG/5ML suspension 30 mL  30 mL Oral Q4H PRN Eligha Bridegroom, NP       amLODipine (NORVASC) tablet 2.5  mg  2.5 mg Oral Daily Eligha Bridegroom, NP   2.5 mg at 08/12/23 0836   aspirin EC tablet 81 mg  81 mg Oral QHS Eligha Bridegroom, NP   81 mg at 08/12/23 2146   atorvastatin (LIPITOR) tablet 80 mg  80 mg Oral QHS Eligha Bridegroom, NP   80 mg at 08/12/23 2145   buPROPion (WELLBUTRIN XL) 24 hr tablet 300 mg  300 mg Oral QHS Eligha Bridegroom, NP   300 mg at 08/12/23 2145   clopidogrel (PLAVIX) tablet 75 mg  75 mg Oral Daily Eligha Bridegroom, NP   75 mg at 08/13/23 0847   haloperidol (HALDOL) tablet 5 mg  5 mg Oral TID PRN Eligha Bridegroom, NP       Or   haloperidol lactate (HALDOL) injection 5 mg  5 mg Intramuscular TID PRN Eligha Bridegroom, NP       haloperidol (HALDOL) tablet 7.5 mg  7.5 mg Oral BID Lauro Franklin, MD   7.5 mg at 08/13/23 0848   insulin aspart (novoLOG) injection 0-9 Units  0-9 Units Subcutaneous TID WC Eligha Bridegroom, NP   2 Units at 08/13/23 1210   insulin glargine-yfgn (SEMGLEE) injection 12 Units  12 Units Subcutaneous Daily Lauro Franklin, MD   12 Units at 08/13/23 0849   isosorbide dinitrate (ISORDIL) tablet 20 mg  20 mg Oral BID Eligha Bridegroom, NP   20 mg at 08/12/23 0836   latanoprost (XALATAN) 0.005 % ophthalmic solution 1 drop  1 drop Left Eye QHS Eligha Bridegroom, NP   1 drop at 08/12/23 2146   levothyroxine (SYNTHROID) tablet 50 mcg  50 mcg Oral QAC breakfast Eligha Bridegroom, NP   50 mcg at 08/13/23 0604   LORazepam (ATIVAN) tablet 2 mg  2 mg Oral TID PRN Eligha Bridegroom, NP       Or   LORazepam (ATIVAN) injection 2 mg  2 mg Intramuscular TID PRN Eligha Bridegroom, NP       losartan (COZAAR) tablet 12.5 mg  12.5 mg Oral Daily Eligha Bridegroom, NP   12.5 mg at 08/13/23 0847   metoprolol tartrate (LOPRESSOR) tablet 25 mg  25 mg Oral Daily Eligha Bridegroom, NP   25 mg  at 08/12/23 0837   pantoprazole (PROTONIX) EC tablet 40 mg  40 mg Oral QHS Eligha Bridegroom, NP   40 mg at 08/12/23 2145   pregabalin (LYRICA) capsule 75 mg  75 mg Oral BID Eligha Bridegroom,  NP   75 mg at 08/13/23 0847   ranolazine (RANEXA) 12 hr tablet 500 mg  500 mg Oral BID Eligha Bridegroom, NP   500 mg at 08/13/23 0847   sertraline (ZOLOFT) tablet 100 mg  100 mg Oral QHS Eligha Bridegroom, NP   100 mg at 08/12/23 2145   traZODone (DESYREL) tablet 50 mg  50 mg Oral QHS PRN Sindy Guadeloupe, NP        Lab Results:  Results for orders placed or performed during the hospital encounter of 08/10/23 (from the past 48 hour(s))  Glucose, capillary     Status: Abnormal   Collection Time: 08/11/23  4:50 PM  Result Value Ref Range   Glucose-Capillary 168 (H) 70 - 99 mg/dL    Comment: Glucose reference range applies only to samples taken after fasting for at least 8 hours.  Glucose, capillary     Status: Abnormal   Collection Time: 08/11/23  9:49 PM  Result Value Ref Range   Glucose-Capillary 139 (H) 70 - 99 mg/dL    Comment: Glucose reference range applies only to samples taken after fasting for at least 8 hours.  Glucose, capillary     Status: Abnormal   Collection Time: 08/12/23  6:05 AM  Result Value Ref Range   Glucose-Capillary 55 (L) 70 - 99 mg/dL    Comment: Glucose reference range applies only to samples taken after fasting for at least 8 hours.   Comment 1 Notify RN    Comment 2 Document in Chart   Glucose, capillary     Status: Abnormal   Collection Time: 08/12/23  6:33 AM  Result Value Ref Range   Glucose-Capillary 104 (H) 70 - 99 mg/dL    Comment: Glucose reference range applies only to samples taken after fasting for at least 8 hours.   Comment 1 Notify RN    Comment 2 Document in Chart   Glucose, capillary     Status: Abnormal   Collection Time: 08/12/23  8:25 AM  Result Value Ref Range   Glucose-Capillary 237 (H) 70 - 99 mg/dL    Comment: Glucose reference range applies only to samples taken after fasting for at least 8 hours.  Glucose, capillary     Status: Abnormal   Collection Time: 08/12/23 12:02 PM  Result Value Ref Range   Glucose-Capillary 206 (H) 70 -  99 mg/dL    Comment: Glucose reference range applies only to samples taken after fasting for at least 8 hours.   Comment 1 Notify RN   Glucose, capillary     Status: Abnormal   Collection Time: 08/12/23  5:14 PM  Result Value Ref Range   Glucose-Capillary 171 (H) 70 - 99 mg/dL    Comment: Glucose reference range applies only to samples taken after fasting for at least 8 hours.  Comprehensive metabolic panel     Status: Abnormal   Collection Time: 08/12/23  6:34 PM  Result Value Ref Range   Sodium 139 135 - 145 mmol/L   Potassium 4.6 3.5 - 5.1 mmol/L   Chloride 105 98 - 111 mmol/L   CO2 26 22 - 32 mmol/L   Glucose, Bld 214 (H) 70 - 99 mg/dL    Comment: Glucose reference range applies only to  samples taken after fasting for at least 8 hours.   BUN 49 (H) 6 - 20 mg/dL   Creatinine, Ser 5.62 (H) 0.44 - 1.00 mg/dL   Calcium 8.8 (L) 8.9 - 10.3 mg/dL   Total Protein 6.4 (L) 6.5 - 8.1 g/dL   Albumin 3.3 (L) 3.5 - 5.0 g/dL   AST 17 15 - 41 U/L   ALT 17 0 - 44 U/L   Alkaline Phosphatase 122 38 - 126 U/L   Total Bilirubin 0.5 0.3 - 1.2 mg/dL   GFR, Estimated 20 (L) >60 mL/min    Comment: (NOTE) Calculated using the CKD-EPI Creatinine Equation (2021)    Anion gap 8 5 - 15    Comment: Performed at [facility], [address], [place], [place] [address]  Lipid panel     Status: Abnormal   Collection Time: 08/12/23  6:34 PM  Result Value Ref Range   Cholesterol 100 0 - 200 mg/dL   Triglycerides 578 (H) <150 mg/dL   HDL 33 (L) >46 mg/dL   Total CHOL/HDL Ratio 3.0 RATIO   VLDL 34 0 - 40 mg/dL   LDL Cholesterol 33 0 - 99 mg/dL    Comment:        Total Cholesterol/HDL:CHD Risk Coronary Heart Disease Risk Table                     Men   Women  1/2 Average Risk   3.4   3.3  Average Risk       5.0   4.4  2 X Average Risk   9.6   7.1  3 X Average Risk  23.4   11.0        Use the calculated Patient Ratio above and the CHD Risk Table to determine the patient's CHD  Risk.        ATP III CLASSIFICATION (LDL):  <100     mg/dL   Optimal  962-952  mg/dL   Near or Above                    Optimal  130-159  mg/dL   Borderline  841-324  mg/dL   High  >401     mg/dL   Very High Performed at [facility], [address], [place], [place] [address]   Glucose, capillary     Status: Abnormal   Collection Time: 08/12/23  8:01 PM  Result Value Ref Range   Glucose-Capillary 229 (H) 70 - 99 mg/dL    Comment: Glucose reference range applies only to samples taken after fasting for at least 8 hours.   Comment 1 Notify RN    Comment 2 Document in Chart   Glucose, capillary     Status: Abnormal   Collection Time: 08/13/23  5:42 AM  Result Value Ref Range   Glucose-Capillary 250 (H) 70 - 99 mg/dL    Comment: Glucose reference range applies only to samples taken after fasting for at least 8 hours.   Comment 1 Notify RN    Comment 2 Document in Chart   Glucose, capillary     Status: Abnormal   Collection Time: 08/13/23 12:06 PM  Result Value Ref Range   Glucose-Capillary 173 (H) 70 - 99 mg/dL    Comment: Glucose reference range applies only to samples taken after fasting for at least 8 hours.    Blood Alcohol level:  Lab Results  Component Value Date   ETH <  10 08/09/2023   ETH <10 07/29/2021    Metabolic Disorder Labs: Lab Results  Component Value Date   HGBA1C 6.9 (H) 06/29/2023   MPG 151.33 11/30/2020   MPG 174.29 10/05/2019   No results found for: "PROLACTIN" Lab Results  Component Value Date   CHOL 100 08/12/2023   TRIG 168 (H) 08/12/2023   HDL 33 (L) 08/12/2023   CHOLHDL 3.0 08/12/2023   VLDL 34 08/12/2023   LDLCALC 33 08/12/2023   LDLCALC 57 09/02/2021    Physical Findings: AIMS:  , ,  ,  ,    CIWA:    COWS:     Musculoskeletal: Strength & Muscle Tone: within normal limits Gait & Station: unsteady, blind Patient leans: N/A  Psychiatric Specialty Exam:  Presentation  General Appearance:  Casual;  Disheveled  Eye Contact: Fair  Speech: Clear and Coherent  Speech Volume: Decreased  Handedness: Right   Mood and Affect  Mood: Anxious  Affect: Constricted   Thought Process  Thought Processes: Linear  Descriptions of Associations:Intact  Orientation:Full (Time, Place and Person)  Thought Content:Paranoid Ideation; Perseveration; Rumination  History of Schizophrenia/Schizoaffective disorder:Yes  Duration of Psychotic Symptoms:Greater than six months  Hallucinations:Hallucinations: Visual Description of Visual Hallucinations: seeing bugs andf snakes  Ideas of Reference:Paranoia  Suicidal Thoughts:Suicidal Thoughts: No  Homicidal Thoughts:Homicidal Thoughts: No   Sensorium  Memory: Immediate Fair; Remote Fair; Recent Fair  Judgment: Fair  Insight: Fair   Chartered certified accountant: Fair  Attention Span: Fair  Recall: [employer] of Knowledge: Fair  Language: Fair   Psychomotor Activity  Psychomotor Activity: Psychomotor Activity: Normal   Assets  Assets: Communication Skills; Desire for Improvement; Housing; Leisure Time; Resilience   Sleep  Sleep: Sleep: Good Number of Hours of Sleep: 7.5    Physical Exam: Physical Exam Constitutional:      General: She is not in acute distress.    Appearance: Normal appearance. She is normal weight. She is not ill-appearing or toxic-appearing.  HENT:     Head: Normocephalic and atraumatic.  Pulmonary:     Effort: Pulmonary effort is normal.  Neurological:     General: No focal deficit present.     Mental Status: She is alert and oriented to person, place, and time. Mental status is at baseline.  Psychiatric:        Mood and Affect: Mood normal.        Behavior: Behavior normal.    Review of Systems  Respiratory:  Negative for cough and shortness of breath.   Cardiovascular:  Negative for chest pain.  Gastrointestinal:  Negative for abdominal pain, constipation,  diarrhea, nausea and vomiting.  Neurological:  Positive for dizziness. Negative for weakness and headaches.   Blood pressure 104/61, pulse 66, temperature 98.9 F (37.2 C), temperature source Oral, resp. rate 15, height 5\' 2"  (1.575 m), weight 69.9 kg, SpO2 94%. Body mass index is 28.17 kg/m.   Treatment Plan Summary: Daily contact with patient to assess and evaluate symptoms and progress in treatment and Medication management  Analeese D. Schults is a 52 yr old female who presented on 9/23 to [facility] with complaints of SI due to worsening of her chronic VH, she was admitted to [facility] on 9/25.  PPHx is significant for Schizoaffective Disorder Bipolar Type, Chronic Hallucinations, Anxiety, and PTSD, and 2 Suicide Attempts (last-OD on Insulin 07/2021) and 5 Prior Psychiatric Hospitalizations (last- [facility] 07/2021), and no history of Self Injurious Behavior.     Susi has responded to  the increase in Haldol as her hallucinations have had some improvement.  She continues to have some dizziness.  She did have an episode of hypoglycemia this morning and the diabetes coordinator has recommended decreasing her Semglee to 12 units.  We will not make any other changes to her medications at this time.  We will continue to monitor.     Schizoaffective Disorder (r/o Bipolar Disorder): -Continue Haldol 7.5 mg BID for psychosis and mood stability. -Continue Agitation Protocol: Haldol/Ativan     Depression  Anxiety  PTSD: -Continue Wellbutrin XL 300 mg QHS for depression and anxiety -Continue Zoloft 100 mg QHS for depression and anxiety     Diabetes: -Continue SSI -Decrease Semglee to 12 units daily     HTN: On 08/13/2023, patient is noted to be hypotensive. -Continue Amlodipine 2.5 mg daily (hold until further notice starting 08/13/2023) -Continue Losartan 12.5 mg daily (hold until further notice starting on 08/13/2023) -Continue Lopressor 25 mg daily     -Continue Aspirin EC 81 mg QHS -Continue Lipitor  80 mg QHS -Continue Plavix 75 mg daily -Continue Isordil 20 mg BID -Continue Xalatan 0.005% 1 drop Left eye QHS -Continue Synthroid 50 mcg daily -Continue Protonix EC 40 mg QHS -Continue Lyrica 75 mg BID -Continue Ranolazine 500 mg BID -Continue PRN's: Tylenol, Maalox, Atarax, Milk of Magnesia, Trazodone   Rex Kras, MD 08/13/2023, 1:36 PM Patient ID: Seriah D Romanoski, female   DOB: [birth year], 52 y.o.   MRN: [id number]

## 2023-08-13 NOTE — Progress Notes (Signed)
1:1 Note  Patient is resting in bedroom. No distress noted. Breathing is even and unlabored. Patient encouraged to drink fluids. 1:1 observation continuing for safety.

## 2023-08-14 DIAGNOSIS — F251 Schizoaffective disorder, depressive type: Secondary | ICD-10-CM | POA: Diagnosis not present

## 2023-08-14 LAB — GLUCOSE, CAPILLARY
Glucose-Capillary: 131 mg/dL — ABNORMAL HIGH (ref 70–99)
Glucose-Capillary: 226 mg/dL — ABNORMAL HIGH (ref 70–99)
Glucose-Capillary: 186 mg/dL — ABNORMAL HIGH (ref 70–99)
Glucose-Capillary: 316 mg/dL — ABNORMAL HIGH (ref 70–99)

## 2023-08-14 NOTE — Progress Notes (Signed)
1:1 Note: Patient resting in bed. Respirations unlabored. No s/s of distress noted. MHT at the bedside. Safety maintained.

## 2023-08-14 NOTE — Progress Notes (Signed)
   08/14/23 2043  Psych Admission Type (Psych Patients Only)  Admission Status Voluntary  Psychosocial Assessment  Patient Complaints Anxiety;Depression  Eye Contact Brief  Facial Expression Anxious;Sad  Affect Depressed  Speech Logical/coherent;Soft;Slow  Interaction Guarded  Motor Activity Slow;Unsteady  Appearance/Hygiene Disheveled  Behavior Characteristics Cooperative;Anxious  Mood Depressed;Anxious  Thought Process  Coherency WDL  Content Preoccupation  Delusions None reported or observed  Perception Hallucinations  Hallucination Visual  Judgment Limited  Confusion None  Danger to Self  Current suicidal ideation? Denies  Danger to Others  Danger to Others None reported or observed

## 2023-08-14 NOTE — Plan of Care (Signed)
  Problem: Education: Goal: Knowledge of [facility] Education information/materials will improve Outcome: Progressing Goal: Emotional status will improve Outcome: Progressing Goal: Mental status will improve Outcome: Progressing Goal: Verbalization of understanding the information provided will improve Outcome: Progressing   

## 2023-08-14 NOTE — Group Note (Signed)
Date:  08/14/2023 Time:  1:49 PM  Group Topic/Focus:  Goals Group:   The focus of this group is to help patients establish daily goals to achieve during treatment and discuss how the patient can incorporate goal setting into their daily lives to aide in recovery.    Participation Level:  Did Not Attend  Participation Quality:      Affect:      Cognitive:      Insight: None  Engagement in Group:   Modes of Intervention:      Additional Comments:     Reymundo Poll 08/14/2023, 1:49 PM

## 2023-08-14 NOTE — Progress Notes (Signed)
   08/14/23 1600  Psych Admission Type (Psych Patients Only)  Admission Status Voluntary  Psychosocial Assessment  Patient Complaints Anxiety;Depression  Eye Contact Brief  Facial Expression Sad  Affect Depressed  Speech Logical/coherent  Interaction Guarded  Motor Activity Slow  Appearance/Hygiene Disheveled  Behavior Characteristics Cooperative;Anxious  Mood Depressed;Anxious  Thought Process  Coherency WDL  Content Preoccupation  Delusions None reported or observed  Perception Hallucinations  Hallucination Visual  Judgment Limited  Confusion None  Danger to Self  Current suicidal ideation? Denies  Danger to Others  Danger to Others None reported or observed

## 2023-08-14 NOTE — Progress Notes (Signed)
Pt continues with 1:1. For safety due to sight impairment and fall risk.  Pt has been compliant and maintaining appropriate boundaries. She was assisted to dinning room and ate 100% of dinner meal.   Sitter remains  with pt no distress noted.

## 2023-08-14 NOTE — Progress Notes (Signed)
1:1 note  1:1 observation ongoing for safety due to visual impairment. MHT remains in room with the patient. Patient remains alert and oriented. No s/s noted. Plan of care ongoing.

## 2023-08-14 NOTE — Group Note (Signed)
Date:  08/14/2023 Time:  9:34 PM  Group Topic/Focus:  Wrap-Up Group:   The focus of this group is to help patients review their daily goal of treatment and discuss progress on daily workbooks.    Participation Level:  Did Not Attend  Participation Quality:   N/A  Affect:   N/A  Cognitive:   N/A  Insight: None  Engagement in Group:  None  Modes of Intervention:   N/A  Additional Comments:  Patient did not attend group.   Deanna Landry 08/14/2023, 9:34 PM

## 2023-08-14 NOTE — Progress Notes (Signed)
Pt continues with 1:1. For safety due to sight impairment and fall risk.  Pt has been compliant and maintaining appropriate boundaries.  Sitter remains at bedside with no distress noted.

## 2023-08-14 NOTE — Progress Notes (Signed)
1:1 Note Patient remains on 1:1 observation. Resting in bed. No distress noted. MHT at the bedside.

## 2023-08-14 NOTE — Progress Notes (Signed)
1:1 Nursing Note: Patient laying in bed sleeping. Took bedtime medications. Blood sugar checked. Patient received Prn Trazodone for sleep. 1:1 continued for safety. Patient remains safe.

## 2023-08-14 NOTE — Group Note (Signed)
LCSW Group Therapy Note   Group Date: 08/14/2023 Start Time: 1000 End Time: 1100   Type of Therapy and Topic:  Group Therapy: Achieving Balance  Participation Level:  Did Not Attend  Description of Group: This group will help everyone identify areas of their lives where they desire more balance. What they desire less of in their lives and identify areas where they need to simplify and or intensify responsibilities. Acknowledging barriers that may interfere with them making changes. Also, what do they need they need to give up to achieve the balanced life that they want.   A lack of balance in work and life is not all uncommon! This exercise can help you understand how to foster work and life balance. It specifically helps you become aware of the areas that do need balance as well as the ones that prevent you from taking action.  Therapeutic Goals:  To develop individualized plans to help improve the strength, mental agility and action step to attain balance in their lives.  To become more aware and encourage clients to look at their situation, identify any stressors, and formulate a plan to prioritize    Summary of Patient Progress:    Pt was invited to attend but did not show up  Therapeutic Modalities:   Task Centered  Strength Based Approach  Steffanie Dunn, Theresia Majors 08/14/2023  3:40 PM

## 2023-08-14 NOTE — Progress Notes (Signed)
Pt continues with 1:1. For safety due to sight impairment and fall risk.  Pt has been compliant and maintaining appropriate boundaries. Pt was assisted with a shower and changed clothes.  Sitter remains at bedside with no distress noted.

## 2023-08-14 NOTE — Progress Notes (Signed)
Patient noted with low BP 101/56, 72/45, 75/45. Gatorade given and a pitcher of water provided to patient in the room. PO fluid encouraged.

## 2023-08-14 NOTE — Progress Notes (Signed)
   08/13/23 2200  Psych Admission Type (Psych Patients Only)  Admission Status Voluntary  Psychosocial Assessment  Patient Complaints Anxiety;Depression;Hopelessness  Eye Contact Brief  Facial Expression Anxious;Sad  Affect Depressed;Anxious;Preoccupied  Speech Logical/coherent;Soft;Slow  Interaction Guarded  Motor Activity Unsteady;Slow  Appearance/Hygiene Disheveled  Behavior Characteristics Cooperative;Anxious  Mood Depressed;Anxious  Thought Process  Coherency WDL  Content Preoccupation  Delusions None reported or observed  Perception Hallucinations  Hallucination Visual  Judgment Limited  Confusion None  Danger to Self  Current suicidal ideation? Denies  Danger to Others  Danger to Others None reported or observed

## 2023-08-14 NOTE — Progress Notes (Signed)
[facility] MD Progress Note  08/14/2023 11:45 AM Deanna Landry  MRN:  [id number] Subjective:   Deanna Landry is a 52 yr old female who presented on 9/23 to [facility] with complaints of SI due to worsening of her chronic VH, she was admitted to [facility] on 9/25. PPHx is significant for Schizoaffective Disorder Bipolar Type, Chronic Hallucinations, Anxiety, and PTSD, and 2 Suicide Attempts (last-OD on Insulin 07/2021) and 5 Prior Psychiatric Hospitalizations (last- [facility] 07/2021), and no history of Self Injurious Behavior.    Case was discussed in the multidisciplinary team. MAR reviewed today.  Staff reports the patient has been fairly cooperative but continues to have problems with hypotension despite holding most of her antihypertensives.  Her BUN and creatinine were high at 49 and 2.83 with a calcium of 8.8 on 08/12/2023.  Repeat is pending at this time.  Psychiatric Team made the following recommendations yesterday: -Continue Haldol to 7.5 mg BID for psychosis and mood stability.  -Continue Wellbutrin XL 300 mg QHS for depression and anxiety -Continue Zoloft 100 mg QHS for depression and anxiety  On interview today patient reports she slept good last night and she reports 8 hours of sleep..  She reports her appetite is doing good.  She reports no SI, HI, or AH.  She states that her visual hallucinations are improving and this morning she has not had any hallucinations..  She reports no Paranoia or Ideas of Reference.  She reports no issues with her medications.  She reports her mood is still somewhat low.  She reports mild improvement in her symptoms.  She has occasional dizzy spells because of her hypotensive episodes.  Her antihypertensives have been held.  Will continue to monitor her closely. Repeat blood pressure today was 129/63 and appears to be improving.    Principal Problem: Schizoaffective disorder (HCC) Diagnosis: Principal Problem:   Schizoaffective disorder (HCC)  Total Time spent with  patient:  I personally spent 35 minutes on the unit in direct patient care. The direct patient care time included face-to-face time with the patient, reviewing the patient's chart, communicating with other professionals, and coordinating care. Greater than 50% of this time was spent in counseling or coordinating care with the patient regarding goals of hospitalization, psycho-education, and discharge planning needs.   Past Psychiatric History: Schizoaffective Disorder Bipolar Type, Chronic Hallucinations, Anxiety, and PTSD, and 2 Suicide Attempts (last-OD on Insulin 07/2021) and 5 Prior Psychiatric Hospitalizations (last- [facility] 07/2021), and no history of Self Injurious Behavior.   Past Medical History:  Past Medical History:  Diagnosis Date   Acute kidney injury superimposed on CKD (HCC) 03/15/2019   Acute respiratory failure with hypoxia (HCC) 03/15/2019   Adjustment disorder with mixed anxiety and depressed mood 03/11/2019   Anemia    Anxiety    Arthritis    right ankle   Blind    right eye   CHF (congestive heart failure) (HCC)    Closed right pilon fracture, initial encounter 07/26/2019   COPD (chronic obstructive pulmonary disease) (HCC)    Coronary artery disease    Depression    Diabetes mellitus type 2 in obese 07/26/2019   Diabetes mellitus without complication (HCC)    type 1   Diabetic polyneuropathy (HCC) 05/04/2016   Dyslipidemia 01/03/2020   Fever 03/10/2019   GERD (gastroesophageal reflux disease)    History of blood transfusion    HLD (hyperlipidemia)    Hyperglycemia    Hypertension    Hypothyroidism    Ileus,  postoperative (HCC)    Major depression, recurrent (HCC) 03/09/2019   MDD (major depressive disorder), severe (HCC) 03/09/2019   Myocardial infarct, old    2020, s/p DES RCA, RPDA 10/23/19   Neck pain 04/21/2016   Pneumonia 10/2019   Renal disorder    stage 3   Sepsis (HCC) 03/10/2019   Short-term memory loss    mild   Sleep apnea    study done 9/27 and  08/13/20 - no results yet 08/21/20   Stroke (HCC)    Suicidal ideation 03/15/2019   Type 1 diabetes mellitus without complication (HCC) 03/15/2019   Wound infection after surgery (Right ankle) 10/04/2019    Past Surgical History:  Procedure Laterality Date   ABDOMINAL HYSTERECTOMY  2014   ANKLE FUSION Right 08/23/2020   Procedure: ANKLE FUSION;  Surgeon: Roby Lofts, MD;  Location: [facility] OR;  Service: Orthopedics;  Laterality: Right;   APPLICATION OF WOUND VAC Right 10/11/2019   Procedure: WOUND VAC CHANGE TO RIGHT LEG;  Surgeon: Roby Lofts, MD;  Location: [facility] OR;  Service: Orthopedics;  Laterality: Right;   CORONARY ANGIOPLASTY  10/23/2019   DES RCA, DES RPDA 10/23/19 [facility])   EYE SURGERY     HARDWARE REMOVAL Right 10/06/2019   Procedure: HARDWARE REMOVAL;  Surgeon: Roby Lofts, MD;  Location: [facility] OR;  Service: Orthopedics;  Laterality: Right;   HARDWARE REMOVAL Right 08/23/2020   Procedure: HARDWARE REMOVAL;  Surgeon: Roby Lofts, MD;  Location: [facility] OR;  Service: Orthopedics;  Laterality: Right;   I & D EXTREMITY Right 10/06/2019   Procedure: IRRIGATION AND DEBRIDEMENT EXTREMITY;  Surgeon: Roby Lofts, MD;  Location: [facility] OR;  Service: Orthopedics;  Laterality: Right;   I & D EXTREMITY Right 10/09/2019   Procedure: IRRIGATION AND DEBRIDEMENT EXTREMITY and WOUND VAC CHANGE RIGHT ANKLE;  Surgeon: Roby Lofts, MD;  Location: [facility] OR;  Service: Orthopedics;  Laterality: Right;   ORIF ANKLE FRACTURE Right 07/26/2019   Procedure: OPEN REDUCTION INTERNAL FIXATION (ORIF) ANKLE FRACTURE;  Surgeon: Roby Lofts, MD;  Location: [facility] OR;  Service: Orthopedics;  Laterality: Right;  OPEN REDUCTION INTERNAL FIXATION (ORIF) ANKLE FRACTURE    TOE AMPUTATION     Family History:  Family History  Problem Relation Age of Onset   Diabetes Mellitus II Mother    Stroke Mother    Heart attack Mother    Stomach cancer Father    Congestive Heart Failure Father    Diabetes Sister    Congestive Heart  Failure Maternal Grandfather    Family Psychiatric  History:  Mother- EtOH Abuse Maternal Uncle- Suicide Attempt Cousin- Heroin Abuse  Social History:  Social History   Substance and Sexual Activity  Alcohol Use Not Currently     Social History   Substance and Sexual Activity  Drug Use Not Currently    Social History   Socioeconomic History   Marital status: Divorced    Spouse name: Not on file   Number of children: Not on file   Years of education: Not on file   Highest education level: Not on file  Occupational History   Not on file  Tobacco Use   Smoking status: Never   Smokeless tobacco: Never  Vaping Use   Vaping status: Never Used  Substance and Sexual Activity   Alcohol use: Not Currently   Drug use: Not Currently   Sexual activity: Not on file    Comment: Hysterectomy  Other Topics Concern   Not on  file  Social History Narrative   Not on file   Social Determinants of Health   Financial Resource Strain: Not on file  Food Insecurity: No Food Insecurity (08/10/2023)   Hunger Vital Sign    Worried About Running Out of Food in the Last Year: Never true    Ran Out of Food in the Last Year: Never true  Transportation Needs: No Transportation Needs (08/10/2023)   PRAPARE - Administrator, Civil Service (Medical): No    Lack of Transportation (Non-Medical): No  Physical Activity: Not on file  Stress: Not on file  Social Connections: Not on file   Additional Social History:                         Sleep: Good  Appetite:  Good  Current Medications: Current Facility-Administered Medications  Medication Dose Route Frequency Provider Last Rate Last Admin   acetaminophen (TYLENOL) tablet 650 mg  650 mg Oral Q6H PRN Eligha Bridegroom, NP       alum & mag hydroxide-simeth (MAALOX/MYLANTA) 200-200-20 MG/5ML suspension 30 mL  30 mL Oral Q4H PRN Eligha Bridegroom, NP       amLODipine (NORVASC) tablet 2.5 mg  2.5 mg Oral Daily Eligha Bridegroom, NP   2.5 mg at 08/12/23 0836   aspirin EC tablet 81 mg  81 mg Oral QHS Eligha Bridegroom, NP   81 mg at 08/13/23 2115   atorvastatin (LIPITOR) tablet 80 mg  80 mg Oral QHS Eligha Bridegroom, NP   80 mg at 08/13/23 2116   buPROPion (WELLBUTRIN XL) 24 hr tablet 300 mg  300 mg Oral QHS Eligha Bridegroom, NP   300 mg at 08/13/23 2115   clopidogrel (PLAVIX) tablet 75 mg  75 mg Oral Daily Eligha Bridegroom, NP   75 mg at 08/14/23 4332   haloperidol (HALDOL) tablet 5 mg  5 mg Oral TID PRN Eligha Bridegroom, NP       Or   haloperidol lactate (HALDOL) injection 5 mg  5 mg Intramuscular TID PRN Eligha Bridegroom, NP       haloperidol (HALDOL) tablet 7.5 mg  7.5 mg Oral BID Lauro Franklin, MD   7.5 mg at 08/14/23 0823   insulin aspart (novoLOG) injection 0-9 Units  0-9 Units Subcutaneous TID WC Eligha Bridegroom, NP   1 Units at 08/14/23 0636   insulin glargine-yfgn (SEMGLEE) injection 12 Units  12 Units Subcutaneous Daily Lauro Franklin, MD   12 Units at 08/14/23 0826   isosorbide dinitrate (ISORDIL) tablet 20 mg  20 mg Oral BID Eligha Bridegroom, NP   20 mg at 08/12/23 0836   latanoprost (XALATAN) 0.005 % ophthalmic solution 1 drop  1 drop Left Eye QHS Eligha Bridegroom, NP   1 drop at 08/13/23 2116   levothyroxine (SYNTHROID) tablet 50 mcg  50 mcg Oral QAC breakfast Eligha Bridegroom, NP   50 mcg at 08/14/23 9518   LORazepam (ATIVAN) tablet 2 mg  2 mg Oral TID PRN Eligha Bridegroom, NP       Or   LORazepam (ATIVAN) injection 2 mg  2 mg Intramuscular TID PRN Eligha Bridegroom, NP       pantoprazole (PROTONIX) EC tablet 40 mg  40 mg Oral QHS Eligha Bridegroom, NP   40 mg at 08/13/23 2115   pregabalin (LYRICA) capsule 75 mg  75 mg Oral BID Eligha Bridegroom, NP   75 mg at 08/14/23 0823   ranolazine (RANEXA) 12 hr  tablet 500 mg  500 mg Oral BID Eligha Bridegroom, NP   500 mg at 08/14/23 0836   sertraline (ZOLOFT) tablet 100 mg  100 mg Oral QHS Eligha Bridegroom, NP   100 mg at 08/13/23 2115    traZODone (DESYREL) tablet 50 mg  50 mg Oral QHS PRN Sindy Guadeloupe, NP        Lab Results:  Results for orders placed or performed during the hospital encounter of 08/10/23 (from the past 48 hour(s))  Glucose, capillary     Status: Abnormal   Collection Time: 08/12/23 12:02 PM  Result Value Ref Range   Glucose-Capillary 206 (H) 70 - 99 mg/dL    Comment: Glucose reference range applies only to samples taken after fasting for at least 8 hours.   Comment 1 Notify RN   Glucose, capillary     Status: Abnormal   Collection Time: 08/12/23  5:14 PM  Result Value Ref Range   Glucose-Capillary 171 (H) 70 - 99 mg/dL    Comment: Glucose reference range applies only to samples taken after fasting for at least 8 hours.  Comprehensive metabolic panel     Status: Abnormal   Collection Time: 08/12/23  6:34 PM  Result Value Ref Range   Sodium 139 135 - 145 mmol/L   Potassium 4.6 3.5 - 5.1 mmol/L   Chloride 105 98 - 111 mmol/L   CO2 26 22 - 32 mmol/L   Glucose, Bld 214 (H) 70 - 99 mg/dL    Comment: Glucose reference range applies only to samples taken after fasting for at least 8 hours.   BUN 49 (H) 6 - 20 mg/dL   Creatinine, Ser 1.61 (H) 0.44 - 1.00 mg/dL   Calcium 8.8 (L) 8.9 - 10.3 mg/dL   Total Protein 6.4 (L) 6.5 - 8.1 g/dL   Albumin 3.3 (L) 3.5 - 5.0 g/dL   AST 17 15 - 41 U/L   ALT 17 0 - 44 U/L   Alkaline Phosphatase 122 38 - 126 U/L   Total Bilirubin 0.5 0.3 - 1.2 mg/dL   GFR, Estimated 20 (L) >60 mL/min    Comment: (NOTE) Calculated using the CKD-EPI Creatinine Equation (2021)    Anion gap 8 5 - 15    Comment: Performed at [facility], [address], [place], [place] [address]  Lipid panel     Status: Abnormal   Collection Time: 08/12/23  6:34 PM  Result Value Ref Range   Cholesterol 100 0 - 200 mg/dL   Triglycerides 540 (H) <150 mg/dL   HDL 33 (L) >98 mg/dL   Total CHOL/HDL Ratio 3.0 RATIO   VLDL 34 0 - 40 mg/dL   LDL Cholesterol 33 0 - 99 mg/dL     Comment:        Total Cholesterol/HDL:CHD Risk Coronary Heart Disease Risk Table                     Men   Women  1/2 Average Risk   3.4   3.3  Average Risk       5.0   4.4  2 X Average Risk   9.6   7.1  3 X Average Risk  23.4   11.0        Use the calculated Patient Ratio above and the CHD Risk Table to determine the patient's CHD Risk.        ATP III CLASSIFICATION (LDL):  <100     mg/dL  Optimal  100-129  mg/dL   Near or Above                    Optimal  130-159  mg/dL   Borderline  308-657  mg/dL   High  >846     mg/dL   Very High Performed at [facility], [address] [place], [place] [address]   Glucose, capillary     Status: Abnormal   Collection Time: 08/12/23  8:01 PM  Result Value Ref Range   Glucose-Capillary 229 (H) 70 - 99 mg/dL    Comment: Glucose reference range applies only to samples taken after fasting for at least 8 hours.   Comment 1 Notify RN    Comment 2 Document in Chart   Glucose, capillary     Status: Abnormal   Collection Time: 08/13/23  5:42 AM  Result Value Ref Range   Glucose-Capillary 250 (H) 70 - 99 mg/dL    Comment: Glucose reference range applies only to samples taken after fasting for at least 8 hours.   Comment 1 Notify RN    Comment 2 Document in Chart   Glucose, capillary     Status: Abnormal   Collection Time: 08/13/23 12:06 PM  Result Value Ref Range   Glucose-Capillary 173 (H) 70 - 99 mg/dL    Comment: Glucose reference range applies only to samples taken after fasting for at least 8 hours.  Glucose, capillary     Status: Abnormal   Collection Time: 08/13/23  5:27 PM  Result Value Ref Range   Glucose-Capillary 237 (H) 70 - 99 mg/dL    Comment: Glucose reference range applies only to samples taken after fasting for at least 8 hours.  Glucose, capillary     Status: Abnormal   Collection Time: 08/13/23  9:26 PM  Result Value Ref Range   Glucose-Capillary 284 (H) 70 - 99 mg/dL    Comment: Glucose reference  range applies only to samples taken after fasting for at least 8 hours.  Glucose, capillary     Status: Abnormal   Collection Time: 08/14/23  6:28 AM  Result Value Ref Range   Glucose-Capillary 131 (H) 70 - 99 mg/dL    Comment: Glucose reference range applies only to samples taken after fasting for at least 8 hours.  Glucose, capillary     Status: Abnormal   Collection Time: 08/14/23 11:39 AM  Result Value Ref Range   Glucose-Capillary 226 (H) 70 - 99 mg/dL    Comment: Glucose reference range applies only to samples taken after fasting for at least 8 hours.    Blood Alcohol level:  Lab Results  Component Value Date   ETH <10 08/09/2023   ETH <10 07/29/2021    Metabolic Disorder Labs: Lab Results  Component Value Date   HGBA1C 6.9 (H) 06/29/2023   MPG 151.33 11/30/2020   MPG 174.29 10/05/2019   No results found for: "PROLACTIN" Lab Results  Component Value Date   CHOL 100 08/12/2023   TRIG 168 (H) 08/12/2023   HDL 33 (L) 08/12/2023   CHOLHDL 3.0 08/12/2023   VLDL 34 08/12/2023   LDLCALC 33 08/12/2023   LDLCALC 57 09/02/2021    Physical Findings: AIMS:  , ,  ,  ,    CIWA:    COWS:     Musculoskeletal: Strength & Muscle Tone: within normal limits Gait & Station: unsteady, blind Patient leans: N/A  Psychiatric Specialty Exam:  Presentation  General Appearance:  Casual; Disheveled  Eye Contact: Fair  Speech: Clear and Coherent  Speech Volume: Decreased  Handedness: Right   Mood and Affect  Mood: Depressed  Affect: Appropriate; Constricted   Thought Process  Thought Processes: Linear  Descriptions of Associations:Intact  Orientation:Full (Time, Place and Person)  Thought Content:Perseveration  History of Schizophrenia/Schizoaffective disorder:Yes  Duration of Psychotic Symptoms:Greater than six months  Hallucinations:Hallucinations: Visual  Ideas of Reference:Paranoia  Suicidal Thoughts:Suicidal Thoughts: No  Homicidal  Thoughts:Homicidal Thoughts: No   Sensorium  Memory: Immediate Fair; Recent Fair; Remote Fair  Judgment: Fair  Insight: Fair   Art therapist  Concentration: Fair  Attention Span: Fair  Recall: [employer] of Knowledge: Fair  Language: Fair   Psychomotor Activity  Psychomotor Activity: Psychomotor Activity: Normal   Assets  Assets: Communication Skills; Desire for Improvement; Social Support; Housing   Sleep  Sleep: Sleep: Good Number of Hours of Sleep: 8    Physical Exam: Physical Exam Constitutional:      General: She is not in acute distress.    Appearance: Normal appearance. She is normal weight. She is not ill-appearing or toxic-appearing.  HENT:     Head: Normocephalic and atraumatic.  Pulmonary:     Effort: Pulmonary effort is normal.  Neurological:     General: No focal deficit present.     Mental Status: She is alert and oriented to person, place, and time. Mental status is at baseline.  Psychiatric:        Mood and Affect: Mood normal.        Behavior: Behavior normal.    Review of Systems  Respiratory:  Negative for cough and shortness of breath.   Cardiovascular:  Negative for chest pain.  Gastrointestinal:  Negative for abdominal pain, constipation, diarrhea, nausea and vomiting.  Neurological:  Positive for dizziness. Negative for weakness and headaches.   Blood pressure 129/63, pulse 65, temperature 97.6 F (36.4 C), temperature source Oral, resp. rate 17, height 5\' 2"  (1.575 m), weight 69.9 kg, SpO2 98%. Body mass index is 28.17 kg/m.   Treatment Plan Summary: Daily contact with patient to assess and evaluate symptoms and progress in treatment and Medication management  Deanna Landry is a 52 yr old female who presented on 9/23 to [facility] with complaints of SI due to worsening of her chronic VH, she was admitted to [facility] on 9/25.  PPHx is significant for Schizoaffective Disorder Bipolar Type, Chronic Hallucinations, Anxiety,  and PTSD, and 2 Suicide Attempts (last-OD on Insulin 07/2021) and 5 Prior Psychiatric Hospitalizations (last- [facility] 07/2021), and no history of Self Injurious Behavior.     Deanna Landry has responded to the increase in Haldol as her hallucinations have had some improvement.  She continues to have some dizziness.  She did have an episode of hypoglycemia this morning and the diabetes coordinator has recommended decreasing her Semglee to 12 units.  We will not make any other changes to her medications at this time.  We will continue to monitor.     Schizoaffective Disorder (r/o Bipolar Disorder): -Continue Haldol 7.5 mg BID for psychosis and mood stability. -Continue Agitation Protocol: Haldol/Ativan     Depression  Anxiety  PTSD: -Continue Wellbutrin XL 300 mg QHS for depression and anxiety -Continue Zoloft 100 mg QHS for depression and anxiety     Diabetes: -Continue SSI -Decrease Semglee to 12 units daily     HTN: On 08/13/2023, patient is noted to be hypotensive. -Continue Amlodipine 2.5 mg daily (hold until further notice starting  08/13/2023) -Continue Losartan 12.5 mg daily (hold until further notice starting on 08/13/2023) -Continue Lopressor 25 mg daily, this was held today due to hypertension.     -Continue Aspirin EC 81 mg QHS -Continue Lipitor 80 mg QHS -Continue Plavix 75 mg daily -Continue Isordil 20 mg BID -Continue Xalatan 0.005% 1 drop Left eye QHS -Continue Synthroid 50 mcg daily -Continue Protonix EC 40 mg QHS -Continue Lyrica 75 mg BID -Continue Ranolazine 500 mg BID -Continue PRN's: Tylenol, Maalox, Atarax, Milk of Magnesia, Trazodone   Rex Kras, MD 08/14/2023, 11:45 AM Patient ID: Hyun D Bussa, female   DOB: August 22, 1971, 52 y.o.   MRN: [id number] Patient ID: Deanna Landry, female   DOB: August 14, 1971, 52 y.o.   MRN: [id number]

## 2023-08-14 NOTE — Plan of Care (Signed)
  Problem: Education: Goal: Knowledge of [facility] Education information/materials will improve Outcome: Progressing   Problem: Education: Goal: Verbalization of understanding the information provided will improve Outcome: Progressing   Problem: Coping: Goal: Ability to verbalize frustrations and anger appropriately will improve Outcome: Progressing   Problem: Safety: Goal: Periods of time without injury will increase Outcome: Progressing

## 2023-08-14 NOTE — Progress Notes (Signed)
   08/14/23 8657  15 Minute Checks  Location Bedroom  Visual Appearance Calm  Behavior Composed  Sleep (Behavioral Health Patients Only)  Calculate sleep? (Click Yes once per 24 hr at 0600 safety check) Yes  Documented sleep last 24 hours 8

## 2023-08-15 DIAGNOSIS — F251 Schizoaffective disorder, depressive type: Secondary | ICD-10-CM | POA: Diagnosis not present

## 2023-08-15 LAB — GLUCOSE, CAPILLARY
Glucose-Capillary: 302 mg/dL — ABNORMAL HIGH (ref 70–99)
Glucose-Capillary: 208 mg/dL — ABNORMAL HIGH (ref 70–99)
Glucose-Capillary: 184 mg/dL — ABNORMAL HIGH (ref 70–99)
Glucose-Capillary: 263 mg/dL — ABNORMAL HIGH (ref 70–99)

## 2023-08-15 MED ORDER — HALOPERIDOL 5 MG PO TABS
10.0000 mg | ORAL_TABLET | Freq: Two times a day (BID) | ORAL | Status: DC
  Administered 2023-08-15 – 2023-08-17 (×4): 10 mg via ORAL
  Filled 2023-08-15 (×8): qty 2

## 2023-08-15 MED ORDER — BUPROPION HCL ER (XL) 150 MG PO TB24
150.0000 mg | ORAL_TABLET | Freq: Every day | ORAL | Status: DC
  Administered 2023-08-15 – 2023-08-16 (×2): 150 mg via ORAL
  Filled 2023-08-15 (×4): qty 1

## 2023-08-15 NOTE — Progress Notes (Incomplete Revision)
   1:1 Nursing Note 08/16/2023 0200  Patient is resting in her bed with eyes closed. Patient is positioned on her right side.Patient is breathing normal, with no distress is noted. Patient remains on 1:1 with sitter at her side. Patient is safe on the unit with q 15 minute safety checks.

## 2023-08-15 NOTE — Progress Notes (Signed)
   1:1 Nursing Note 08/16/2023 0200  Patient is resting in her bed with eyes closed. Patient is positioned on her right side.Patient is breathing normal, with no distress is noted. Patient remains on 1:1 with sitter at her side. Patient is safe on the unit with q 15 minute safety checks.

## 2023-08-15 NOTE — Progress Notes (Signed)
1:1 Nursing Note: Patient in bed sleeping. No distress noted. Patient remains safe. 1:1 continued for safety.

## 2023-08-15 NOTE — Progress Notes (Signed)
  1:1 Nursing Note 08/15/2023 2200  Patient is resting in her bed with eyes closed. Patient is positioned on her right side.Patient is breathing normal, with no distress is noted. Patient remains on 1:1 with sitter at her side. Patient is safe on the unit with q 15 minute safety checks.

## 2023-08-15 NOTE — Progress Notes (Signed)
[facility] MD Progress Note  08/15/2023 10:07 AM Deanna Landry  MRN:  [id number] Subjective:   Deanna Landry is a 52 yr old female who presented on 9/23 to [facility] with complaints of SI due to worsening of her chronic VH, she was admitted to [facility] on 9/25. PPHx is significant for Schizoaffective Disorder Bipolar Type, Chronic Hallucinations, Anxiety, and PTSD, and 2 Suicide Attempts (last-OD on Insulin 07/2021) and 5 Prior Psychiatric Hospitalizations (last- [facility] 07/2021), and no history of Self Injurious Behavior.    Case was discussed in the multidisciplinary team. MAR reviewed today.  The patient continues to be on one-on-one observation.  She is legally blind and has high fall risk.  She has been compliant with medications.  Staff notes that her blood pressures continued to trend on the low side despite adjustment of her medications.  Labs were drawn on 08/12/2023.  Repeat labs are pending.  Psychiatric Team made the following recommendations yesterday: -Continue Haldol to 7.5 mg BID for psychosis and mood stability.  -Continue Wellbutrin XL 300 mg QHS for depression and anxiety -Continue Zoloft 100 mg QHS for depression and anxiety  On interview today patient reports she slept good last night and she reports 7.5 hours of sleep..  She reports her appetite is doing good.  She reports no SI, HI, or AH.  She reports that her hallucinations are worse today for some reason and she feels that these are not floaters in her eyes.  She has very specific and claims that she is seeing the bugs again and a shape like backs, and the lady bugs are underneath the bad swings.  When they get mad at her to open their"butts" and let out these bugs that come and bite her.  She can feel the bites.  She tries to close her eyes and pray for them to go away.  Her depression is slightly worse because of the bugs.   She reports mild paranoia or Ideas of Reference.  She reports no issues with her medications.  The plan is to  increase Adderall to 10 mg twice a day.  Norvasc is being discontinued because of persistent low blood pressure.  We will also consider tapering the Wellbutrin since patient is already on Seroquel.   Principal Problem: Schizoaffective disorder (HCC) Diagnosis: Principal Problem:   Schizoaffective disorder (HCC)  Total Time spent with patient:  I personally spent 35 minutes on the unit in direct patient care. The direct patient care time included face-to-face time with the patient, reviewing the patient's chart, communicating with other professionals, and coordinating care. Greater than 50% of this time was spent in counseling or coordinating care with the patient regarding goals of hospitalization, psycho-education, and discharge planning needs.   Past Psychiatric History: Schizoaffective Disorder Bipolar Type, Chronic Hallucinations, Anxiety, and PTSD, and 2 Suicide Attempts (last-OD on Insulin 07/2021) and 5 Prior Psychiatric Hospitalizations (last- [facility] 07/2021), and no history of Self Injurious Behavior.   Past Medical History:  Past Medical History:  Diagnosis Date   Acute kidney injury superimposed on CKD (HCC) 03/15/2019   Acute respiratory failure with hypoxia (HCC) 03/15/2019   Adjustment disorder with mixed anxiety and depressed mood 03/11/2019   Anemia    Anxiety    Arthritis    right ankle   Blind    right eye   CHF (congestive heart failure) (HCC)    Closed right pilon fracture, initial encounter 07/26/2019   COPD (chronic obstructive pulmonary disease) (HCC)  Coronary artery disease    Depression    Diabetes mellitus type 2 in obese 07/26/2019   Diabetes mellitus without complication (HCC)    type 1   Diabetic polyneuropathy (HCC) 05/04/2016   Dyslipidemia 01/03/2020   Fever 03/10/2019   GERD (gastroesophageal reflux disease)    History of blood transfusion    HLD (hyperlipidemia)    Hyperglycemia    Hypertension    Hypothyroidism    Ileus, postoperative (HCC)     Major depression, recurrent (HCC) 03/09/2019   MDD (major depressive disorder), severe (HCC) 03/09/2019   Myocardial infarct, old    2020, s/p DES RCA, RPDA 10/23/19   Neck pain 04/21/2016   Pneumonia 10/2019   Renal disorder    stage 3   Sepsis (HCC) 03/10/2019   Short-term memory loss    mild   Sleep apnea    study done 9/27 and 08/13/20 - no results yet 08/21/20   Stroke (HCC)    Suicidal ideation 03/15/2019   Type 1 diabetes mellitus without complication (HCC) 03/15/2019   Wound infection after surgery (Right ankle) 10/04/2019    Past Surgical History:  Procedure Laterality Date   ABDOMINAL HYSTERECTOMY  2014   ANKLE FUSION Right 08/23/2020   Procedure: ANKLE FUSION;  Surgeon: Roby Lofts, MD;  Location: [facility] OR;  Service: Orthopedics;  Laterality: Right;   APPLICATION OF WOUND VAC Right 10/11/2019   Procedure: WOUND VAC CHANGE TO RIGHT LEG;  Surgeon: Roby Lofts, MD;  Location: [facility] OR;  Service: Orthopedics;  Laterality: Right;   CORONARY ANGIOPLASTY  10/23/2019   DES RCA, DES RPDA 10/23/19 [facility])   EYE SURGERY     HARDWARE REMOVAL Right 10/06/2019   Procedure: HARDWARE REMOVAL;  Surgeon: Roby Lofts, MD;  Location: [facility] OR;  Service: Orthopedics;  Laterality: Right;   HARDWARE REMOVAL Right 08/23/2020   Procedure: HARDWARE REMOVAL;  Surgeon: Roby Lofts, MD;  Location: [facility] OR;  Service: Orthopedics;  Laterality: Right;   I & D EXTREMITY Right 10/06/2019   Procedure: IRRIGATION AND DEBRIDEMENT EXTREMITY;  Surgeon: Roby Lofts, MD;  Location: [facility] OR;  Service: Orthopedics;  Laterality: Right;   I & D EXTREMITY Right 10/09/2019   Procedure: IRRIGATION AND DEBRIDEMENT EXTREMITY and WOUND VAC CHANGE RIGHT ANKLE;  Surgeon: Roby Lofts, MD;  Location: [facility] OR;  Service: Orthopedics;  Laterality: Right;   ORIF ANKLE FRACTURE Right 07/26/2019   Procedure: OPEN REDUCTION INTERNAL FIXATION (ORIF) ANKLE FRACTURE;  Surgeon: Roby Lofts, MD;  Location: [facility] OR;  Service: Orthopedics;   Laterality: Right;  OPEN REDUCTION INTERNAL FIXATION (ORIF) ANKLE FRACTURE    TOE AMPUTATION     Family History:  Family History  Problem Relation Age of Onset   Diabetes Mellitus II Mother    Stroke Mother    Heart attack Mother    Stomach cancer Father    Congestive Heart Failure Father    Diabetes Sister    Congestive Heart Failure Maternal Grandfather    Family Psychiatric  History:  Mother- EtOH Abuse Maternal Uncle- Suicide Attempt Cousin- Heroin Abuse  Social History:  Social History   Substance and Sexual Activity  Alcohol Use Not Currently     Social History   Substance and Sexual Activity  Drug Use Not Currently    Social History   Socioeconomic History   Marital status: Divorced    Spouse name: Not on file   Number of children: Not on file   Years of education: Not  on file   Highest education level: Not on file  Occupational History   Not on file  Tobacco Use   Smoking status: Never   Smokeless tobacco: Never  Vaping Use   Vaping status: Never Used  Substance and Sexual Activity   Alcohol use: Not Currently   Drug use: Not Currently   Sexual activity: Not on file    Comment: Hysterectomy  Other Topics Concern   Not on file  Social History Narrative   Not on file   Social Determinants of Health   Financial Resource Strain: Not on file  Food Insecurity: No Food Insecurity (08/10/2023)   Hunger Vital Sign    Worried About Running Out of Food in the Last Year: Never true    Ran Out of Food in the Last Year: Never true  Transportation Needs: No Transportation Needs (08/10/2023)   PRAPARE - Administrator, Civil Service (Medical): No    Lack of Transportation (Non-Medical): No  Physical Activity: Not on file  Stress: Not on file  Social Connections: Not on file   Additional Social History:                         Sleep: Good  Appetite:  Good  Current Medications: Current Facility-Administered Medications   Medication Dose Route Frequency Provider Last Rate Last Admin   acetaminophen (TYLENOL) tablet 650 mg  650 mg Oral Q6H PRN Eligha Bridegroom, NP       alum & mag hydroxide-simeth (MAALOX/MYLANTA) 200-200-20 MG/5ML suspension 30 mL  30 mL Oral Q4H PRN Eligha Bridegroom, NP       aspirin EC tablet 81 mg  81 mg Oral QHS Eligha Bridegroom, NP   81 mg at 08/14/23 2120   atorvastatin (LIPITOR) tablet 80 mg  80 mg Oral QHS Eligha Bridegroom, NP   80 mg at 08/14/23 2120   buPROPion (WELLBUTRIN XL) 24 hr tablet 300 mg  300 mg Oral QHS Eligha Bridegroom, NP   300 mg at 08/14/23 2120   clopidogrel (PLAVIX) tablet 75 mg  75 mg Oral Daily Eligha Bridegroom, NP   75 mg at 08/15/23 1610   haloperidol (HALDOL) tablet 10 mg  10 mg Oral BID Rex Kras, MD       haloperidol (HALDOL) tablet 5 mg  5 mg Oral TID PRN Eligha Bridegroom, NP       Or   haloperidol lactate (HALDOL) injection 5 mg  5 mg Intramuscular TID PRN Eligha Bridegroom, NP       insulin aspart (novoLOG) injection 0-9 Units  0-9 Units Subcutaneous TID WC Eligha Bridegroom, NP   7 Units at 08/15/23 0603   insulin glargine-yfgn (SEMGLEE) injection 12 Units  12 Units Subcutaneous Daily Lauro Franklin, MD   12 Units at 08/15/23 0818   isosorbide dinitrate (ISORDIL) tablet 20 mg  20 mg Oral BID Eligha Bridegroom, NP   20 mg at 08/15/23 0820   latanoprost (XALATAN) 0.005 % ophthalmic solution 1 drop  1 drop Left Eye QHS Eligha Bridegroom, NP   1 drop at 08/14/23 2133   levothyroxine (SYNTHROID) tablet 50 mcg  50 mcg Oral QAC breakfast Eligha Bridegroom, NP   50 mcg at 08/15/23 0603   LORazepam (ATIVAN) tablet 2 mg  2 mg Oral TID PRN Eligha Bridegroom, NP       Or   LORazepam (ATIVAN) injection 2 mg  2 mg Intramuscular TID PRN Eligha Bridegroom, NP  pantoprazole (PROTONIX) EC tablet 40 mg  40 mg Oral QHS Eligha Bridegroom, NP   40 mg at 08/14/23 2120   pregabalin (LYRICA) capsule 75 mg  75 mg Oral BID Eligha Bridegroom, NP   75 mg at 08/15/23 5366    ranolazine (RANEXA) 12 hr tablet 500 mg  500 mg Oral BID Eligha Bridegroom, NP   500 mg at 08/14/23 1711   sertraline (ZOLOFT) tablet 100 mg  100 mg Oral QHS Eligha Bridegroom, NP   100 mg at 08/14/23 2120   traZODone (DESYREL) tablet 50 mg  50 mg Oral QHS PRN Sindy Guadeloupe, NP   50 mg at 08/14/23 2121    Lab Results:  Results for orders placed or performed during the hospital encounter of 08/10/23 (from the past 48 hour(s))  Glucose, capillary     Status: Abnormal   Collection Time: 08/13/23 12:06 PM  Result Value Ref Range   Glucose-Capillary 173 (H) 70 - 99 mg/dL    Comment: Glucose reference range applies only to samples taken after fasting for at least 8 hours.  Glucose, capillary     Status: Abnormal   Collection Time: 08/13/23  5:27 PM  Result Value Ref Range   Glucose-Capillary 237 (H) 70 - 99 mg/dL    Comment: Glucose reference range applies only to samples taken after fasting for at least 8 hours.  Glucose, capillary     Status: Abnormal   Collection Time: 08/13/23  9:26 PM  Result Value Ref Range   Glucose-Capillary 284 (H) 70 - 99 mg/dL    Comment: Glucose reference range applies only to samples taken after fasting for at least 8 hours.  Glucose, capillary     Status: Abnormal   Collection Time: 08/14/23  6:28 AM  Result Value Ref Range   Glucose-Capillary 131 (H) 70 - 99 mg/dL    Comment: Glucose reference range applies only to samples taken after fasting for at least 8 hours.  Glucose, capillary     Status: Abnormal   Collection Time: 08/14/23 11:39 AM  Result Value Ref Range   Glucose-Capillary 226 (H) 70 - 99 mg/dL    Comment: Glucose reference range applies only to samples taken after fasting for at least 8 hours.  Glucose, capillary     Status: Abnormal   Collection Time: 08/14/23  4:56 PM  Result Value Ref Range   Glucose-Capillary 186 (H) 70 - 99 mg/dL    Comment: Glucose reference range applies only to samples taken after fasting for at least 8 hours.  Glucose,  capillary     Status: Abnormal   Collection Time: 08/14/23  9:19 PM  Result Value Ref Range   Glucose-Capillary 316 (H) 70 - 99 mg/dL    Comment: Glucose reference range applies only to samples taken after fasting for at least 8 hours.  Glucose, capillary     Status: Abnormal   Collection Time: 08/15/23  5:48 AM  Result Value Ref Range   Glucose-Capillary 302 (H) 70 - 99 mg/dL    Comment: Glucose reference range applies only to samples taken after fasting for at least 8 hours.   Comment 1 Notify RN     Blood Alcohol level:  Lab Results  Component Value Date   ETH <10 08/09/2023   ETH <10 07/29/2021    Metabolic Disorder Labs: Lab Results  Component Value Date   HGBA1C 6.9 (H) 06/29/2023   MPG 151.33 11/30/2020   MPG 174.29 10/05/2019   No results found for: "PROLACTIN"  Lab Results  Component Value Date   CHOL 100 08/12/2023   TRIG 168 (H) 08/12/2023   HDL 33 (L) 08/12/2023   CHOLHDL 3.0 08/12/2023   VLDL 34 08/12/2023   LDLCALC 33 08/12/2023   LDLCALC 57 09/02/2021    Physical Findings: AIMS:  , ,  ,  ,    CIWA:    COWS:     Musculoskeletal: Strength & Muscle Tone: within normal limits Gait & Station: unsteady, blind Patient leans: N/A  Psychiatric Specialty Exam:  Presentation  General Appearance:  Appropriate for Environment  Eye Contact: Fair  Speech: Clear and Coherent  Speech Volume: Decreased  Handedness: Right   Mood and Affect  Mood: Anxious; Depressed  Affect: Congruent   Thought Process  Thought Processes: Linear  Descriptions of Associations:Intact  Orientation:Full (Time, Place and Person)  Thought Content:Rumination; Perseveration  History of Schizophrenia/Schizoaffective disorder:Yes  Duration of Psychotic Symptoms:Greater than six months  Hallucinations:Hallucinations: Visual  Ideas of Reference:Paranoia  Suicidal Thoughts:Suicidal Thoughts: No  Homicidal Thoughts:Homicidal Thoughts: No   Sensorium   Memory: Immediate Fair; Remote Fair; Recent Fair  Judgment: Fair  Insight: Fair   Art therapist  Concentration: Fair  Attention Span: Fair  Recall: [employer] of Knowledge: Fair  Language: Fair   Psychomotor Activity  Psychomotor Activity: Psychomotor Activity: Normal   Assets  Assets: Communication Skills; Desire for Improvement; Housing   Sleep  Sleep: Sleep: Good Number of Hours of Sleep: 8    Physical Exam: Physical Exam Constitutional:      General: She is not in acute distress.    Appearance: Normal appearance. She is normal weight. She is not ill-appearing or toxic-appearing.  HENT:     Head: Normocephalic and atraumatic.  Pulmonary:     Effort: Pulmonary effort is normal.  Neurological:     General: No focal deficit present.     Mental Status: She is alert and oriented to person, place, and time. Mental status is at baseline.  Psychiatric:        Mood and Affect: Mood normal.        Behavior: Behavior normal.    Review of Systems  Respiratory:  Negative for cough and shortness of breath.   Cardiovascular:  Negative for chest pain.  Gastrointestinal:  Negative for abdominal pain, constipation, diarrhea, nausea and vomiting.  Neurological:  Positive for dizziness. Negative for weakness and headaches.   Blood pressure 99/60, pulse 78, temperature 98.6 F (37 C), temperature source Oral, resp. rate 18, height 5\' 2"  (1.575 m), weight 69.9 kg, SpO2 98%. Body mass index is 28.17 kg/m.   Treatment Plan Summary: Daily contact with patient to assess and evaluate symptoms and progress in treatment and Medication management  Deanna Landry is a 52 yr old female who presented on 9/23 to [facility] with complaints of SI due to worsening of her chronic VH, she was admitted to [facility] on 9/25.  PPHx is significant for Schizoaffective Disorder Bipolar Type, Chronic Hallucinations, Anxiety, and PTSD, and 2 Suicide Attempts (last-OD on Insulin 07/2021) and  5 Prior Psychiatric Hospitalizations (last- [facility] 07/2021), and no history of Self Injurious Behavior.     Deanna Landry has responded to the increase in Haldol as her hallucinations have had some improvement.  She continues to have some dizziness.  She did have an episode of hypoglycemia this morning and the diabetes coordinator has recommended decreasing her Semglee to 12 units.  We will not make any other changes to her medications at this  time.  We will continue to monitor.     Schizoaffective Disorder (r/o Bipolar Disorder): -Increase Haldol to 10 mg BID for psychosis and mood stability. -Continue Agitation Protocol: Haldol/Ativan     Depression  Anxiety  PTSD: -Taper Wellbutrin XL150 mg QHS for depression and anxiety, may be contributing to her hallucinations. -Continue Zoloft 100 mg QHS for depression and anxiety     Diabetes: -Continue SSI -Decrease Semglee to 12 units daily     HTN: On 08/13/2023, patient is noted to be hypotensive. -Continue Amlodipine 2.5 mg daily (hold until further notice starting 08/13/2023) -Continue Losartan 12.5 mg daily (hold until further notice starting on 08/13/2023) -Hold all antihypertensives until further evaluation.     -Continue Aspirin EC 81 mg QHS -Continue Lipitor 80 mg QHS -Continue Plavix 75 mg daily -Continue Isordil 20 mg BID -Continue Xalatan 0.005% 1 drop Left eye QHS -Continue Synthroid 50 mcg daily -Continue Protonix EC 40 mg QHS -Continue Lyrica 75 mg BID -Continue Ranolazine 500 mg BID -Continue PRN's: Tylenol, Maalox, Atarax, Milk of Magnesia, Trazodone   Rex Kras, MD 08/15/2023, 10:07 AM Patient ID: Deanna Landry, female   DOB: 1971-09-13, 52 y.o.   MRN: [id number]

## 2023-08-15 NOTE — Plan of Care (Signed)
  Problem: Education: Goal: Knowledge of [facility] Education information/materials will improve Outcome: Progressing Goal: Emotional status will improve Outcome: Progressing Goal: Mental status will improve Outcome: Progressing Goal: Verbalization of understanding the information provided will improve Outcome: Progressing   

## 2023-08-15 NOTE — Progress Notes (Signed)
1:1 Nursing note: Pt resting in room most of am. No complaints voiced. Remains on 1:1 for safety due to vision problems.   08/15/23 0800  Psych Admission Type (Psych Patients Only)  Admission Status Voluntary  Psychosocial Assessment  Patient Complaints None  Eye Contact Brief  Facial Expression Anxious  Affect Depressed  Speech Logical/coherent;Soft;Slow  Interaction Cautious  Motor Activity Slow  Appearance/Hygiene Unremarkable  Behavior Characteristics Cooperative  Mood Depressed  Thought Process  Coherency WDL  Content Preoccupation  Delusions None reported or observed  Perception Hallucinations  Hallucination Visual  Judgment Limited  Confusion None  Danger to Self  Current suicidal ideation? Denies  Danger to Others  Danger to Others None reported or observed

## 2023-08-15 NOTE — Group Note (Signed)
Date:  08/15/2023 Time:  11:31 AM  Group Topic/Focus:  Goals Group:   The focus of this group is to help patients establish daily goals to achieve during treatment and discuss how the patient can incorporate goal setting into their daily lives to aide in recovery.    Participation Level:  Active  Participation Quality:  Appropriate  Affect:  Appropriate  Cognitive:  Appropriate  Insight: Appropriate  Engagement in Group:  Engaged  Modes of Intervention:  Discussion  Additional Comments:  Patient attended goals group and was attentive the duration of it. Patient's goal was to a discharge plan.   Deanna Landry T [name] 08/15/2023, 11:31 AM

## 2023-08-15 NOTE — Progress Notes (Signed)
   1:1 Nursing Note 08/16/2023 0600  Patient is resting in her bed with eyes closed. Patient is positioned on her right side.Patient is breathing normal, with no distress is noted. Patient remains on 1:1 with sitter at her side. Patient is safe on the unit with q 15 minute safety checks.

## 2023-08-15 NOTE — Progress Notes (Signed)
1:1 Nursing Note: Pt resting in bed, no complaints voiced. Remains on 1:1 for safety.

## 2023-08-15 NOTE — Plan of Care (Signed)
  Problem: Education: Goal: Knowledge of [facility] Education information/materials will improve Outcome: Progressing Goal: Emotional status will improve Outcome: Progressing Goal: Mental status will improve Outcome: Progressing Goal: Verbalization of understanding the information provided will improve Outcome: Progressing   Problem: Activity: Goal: Interest or engagement in activities will improve Outcome: Progressing   Problem: Coping: Goal: Ability to verbalize frustrations and anger appropriately will improve Outcome: Progressing   Problem: Physical Regulation: Goal: Ability to maintain clinical measurements within normal limits will improve Outcome: Progressing   Problem: Safety: Goal: Periods of time without injury will increase Outcome: Progressing

## 2023-08-15 NOTE — Progress Notes (Signed)
   08/15/23 2100  Psych Admission Type (Psych Patients Only)  Admission Status Voluntary  Psychosocial Assessment  Patient Complaints Anxiety;Depression  Eye Contact Brief  Facial Expression Anxious;Sad  Affect Depressed  Speech Logical/coherent;Soft;Slow  Interaction Cautious  Motor Activity Slow  Appearance/Hygiene Unremarkable  Behavior Characteristics Cooperative;Anxious  Mood Depressed;Anxious  Thought Process  Coherency WDL  Content Preoccupation  Delusions None reported or observed  Perception Hallucinations  Hallucination Visual  Judgment Limited  Confusion None  Danger to Self  Current suicidal ideation? Denies  Danger to Others  Danger to Others None reported or observed

## 2023-08-15 NOTE — Progress Notes (Signed)
1:1 Nursing Note: Pt attended breakfast, shared that she is still seeing clear bugs and their babies along with dragons and their babies. "It can drive me crazy." Pt alert and oriented, interested in going into the dayroom this am. Denies current suicidal thoughts and is able to verbally contract for safety. Pt c/o slight dizziness early am, BP improved from 0600 vital time. Remains on 1:1 for safety.

## 2023-08-16 ENCOUNTER — Encounter (HOSPITAL_COMMUNITY): Payer: Self-pay

## 2023-08-16 LAB — GLUCOSE, CAPILLARY
Glucose-Capillary: 257 mg/dL — ABNORMAL HIGH (ref 70–99)
Glucose-Capillary: 307 mg/dL — ABNORMAL HIGH (ref 70–99)
Glucose-Capillary: 169 mg/dL — ABNORMAL HIGH (ref 70–99)
Glucose-Capillary: 214 mg/dL — ABNORMAL HIGH (ref 70–99)

## 2023-08-16 MED ORDER — DOCUSATE SODIUM 100 MG PO CAPS
100.0000 mg | ORAL_CAPSULE | Freq: Two times a day (BID) | ORAL | Status: DC
  Administered 2023-08-16 – 2023-08-20 (×9): 100 mg via ORAL
  Filled 2023-08-16 (×13): qty 1

## 2023-08-16 NOTE — Inpatient Diabetes Management (Addendum)
Inpatient Diabetes Program Recommendations  AACE/ADA: New Consensus Statement on Inpatient Glycemic Control (2015)  Target Ranges:  Prepandial:   less than 140 mg/dL      Peak postprandial:   less than 180 mg/dL (1-2 hours)      Critically ill patients:  140 - 180 mg/dL   Lab Results  Component Value Date   GLUCAP 257 (H) 08/16/2023   HGBA1C 6.9 (H) 06/29/2023    Review of Glycemic Control  Latest Reference Range & Units 08/15/23 05:48 08/15/23 12:42 08/15/23 17:02 08/15/23 21:05 08/16/23 06:07  Glucose-Capillary 70 - 99 mg/dL 469 (H) 629 (H) 528 (H) 263 (H) 257 (H)  (H): Data is abnormally high  Latest Reference Range & Units 08/12/23 18:34  GFR, Estimated >60 mL/min 20 (L)  (L): Data is abnormally low  Diabetes history: DM Outpatient Diabetes medications:  Outpatient: Ozempic 1 mg weekly Tresiba 15 mg daily Novolog 15 units tid with meals  Current orders for Inpatient glycemic control:  Semglee 12 units every day, Novolog 0-9 units TID  Inpatient Diabetes Program Recommendations:    Please consider:  Semglee 14 units every day Novolog 0-6 units TID (very sensitive scale given renal function) Novolog 2-3 units TID with meals if he consumes at least 50%  Will continue to follow while inpatient.  Thank you, Dulce Sellar, MSN, CDCES Diabetes Coordinator Inpatient Diabetes Program [phone] (team pager from 8a-5p)

## 2023-08-16 NOTE — [facility] Group Notes (Signed)

## 2023-08-16 NOTE — Progress Notes (Signed)
   08/16/23 0511  15 Minute Checks  Location Bedroom  Visual Appearance Calm  Behavior Sleeping  Sleep (Behavioral Health Patients Only)  Calculate sleep? (Click Yes once per 24 hr at 0600 safety check) Yes  Documented sleep last 24 hours 8

## 2023-08-16 NOTE — Progress Notes (Addendum)
Patient ID: Deanna Landry, female   DOB: 1971-09-18, 52 y.o.   MRN: [id number] Pt remains on 1.1. due to fall risk and visual impairement . [name] is unsteady on her feet and remains 1.1. with staff at bedside. She is coming out of her room more and mobilizing okay with 1 assist staff member. Pt is safe, will con't to monitor.

## 2023-08-16 NOTE — BH IP Treatment Plan (Signed)
Interdisciplinary Treatment and Diagnostic Plan Update  08/16/2023 Time of Session: 9:20am (UPDATE) Cortlyn D Casa MRN: [id number]  Principal Diagnosis: Schizoaffective disorder [facility])  Secondary Diagnoses: Principal Problem:   Schizoaffective disorder (HCC)   Current Medications:  Current Facility-Administered Medications  Medication Dose Route Frequency Provider Last Rate Last Admin   acetaminophen (TYLENOL) tablet 650 mg  650 mg Oral Q6H PRN Eligha Bridegroom, NP       alum & mag hydroxide-simeth (MAALOX/MYLANTA) 200-200-20 MG/5ML suspension 30 mL  30 mL Oral Q4H PRN Eligha Bridegroom, NP       aspirin EC tablet 81 mg  81 mg Oral QHS Eligha Bridegroom, NP   81 mg at 08/15/23 2109   atorvastatin (LIPITOR) tablet 80 mg  80 mg Oral QHS Eligha Bridegroom, NP   80 mg at 08/15/23 2108   buPROPion (WELLBUTRIN XL) 24 hr tablet 150 mg  150 mg Oral QHS Rex Kras, MD   150 mg at 08/15/23 2108   clopidogrel (PLAVIX) tablet 75 mg  75 mg Oral Daily Eligha Bridegroom, NP   75 mg at 08/16/23 0748   docusate sodium (COLACE) capsule 100 mg  100 mg Oral BID Lauro Franklin, MD       haloperidol (HALDOL) tablet 10 mg  10 mg Oral BID Rex Kras, MD   10 mg at 08/16/23 0748   haloperidol (HALDOL) tablet 5 mg  5 mg Oral TID PRN Eligha Bridegroom, NP       Or   haloperidol lactate (HALDOL) injection 5 mg  5 mg Intramuscular TID PRN Eligha Bridegroom, NP       insulin aspart (novoLOG) injection 0-9 Units  0-9 Units Subcutaneous TID WC Eligha Bridegroom, NP   5 Units at 08/16/23 0612   insulin glargine-yfgn (SEMGLEE) injection 12 Units  12 Units Subcutaneous Daily Lauro Franklin, MD   12 Units at 08/16/23 0803   latanoprost (XALATAN) 0.005 % ophthalmic solution 1 drop  1 drop Left Eye QHS Eligha Bridegroom, NP   1 drop at 08/15/23 2152   levothyroxine (SYNTHROID) tablet 50 mcg  50 mcg Oral QAC breakfast Eligha Bridegroom, NP   50 mcg at 08/16/23 0606   LORazepam (ATIVAN) tablet 2 mg  2 mg Oral TID  PRN Eligha Bridegroom, NP       Or   LORazepam (ATIVAN) injection 2 mg  2 mg Intramuscular TID PRN Eligha Bridegroom, NP       pantoprazole (PROTONIX) EC tablet 40 mg  40 mg Oral QHS Eligha Bridegroom, NP   40 mg at 08/15/23 2108   pregabalin (LYRICA) capsule 75 mg  75 mg Oral BID Eligha Bridegroom, NP   75 mg at 08/16/23 0748   ranolazine (RANEXA) 12 hr tablet 500 mg  500 mg Oral BID Eligha Bridegroom, NP   500 mg at 08/16/23 0748   sertraline (ZOLOFT) tablet 100 mg  100 mg Oral QHS Eligha Bridegroom, NP   100 mg at 08/15/23 2108   traZODone (DESYREL) tablet 50 mg  50 mg Oral QHS PRN Sindy Guadeloupe, NP   50 mg at 08/14/23 2121   PTA Medications: Medications Prior to Admission  Medication Sig Dispense Refill Last Dose   ACCU-CHEK GUIDE test strip USE TO CHECK BLOOD SUGAR 5 TIMES DAILY. DX E 10.65 300 strip 2    acetaminophen (TYLENOL) 325 MG tablet Take 650 mg by mouth every 6 (six) hours as needed for mild pain.      albuterol (VENTOLIN HFA) 108 (90 Base) MCG/ACT inhaler  Inhale 1-2 puffs into the lungs every 6 (six) hours as needed for wheezing or shortness of breath.      ALPRAZolam (XANAX) 0.25 MG tablet Take 0.25 mg by mouth every 6 (six) hours as needed for anxiety or sleep.      amLODipine (NORVASC) 2.5 MG tablet Take 2.5 mg by mouth daily.      aspirin EC 81 MG tablet Take 81 mg by mouth daily.      atorvastatin (LIPITOR) 80 MG tablet Take 80 mg by mouth daily.      buPROPion (WELLBUTRIN XL) 300 MG 24 hr tablet Take 300 mg by mouth daily.      clopidogrel (PLAVIX) 75 MG tablet Take 75 mg by mouth at bedtime.      doxycycline (VIBRAMYCIN) 100 MG capsule Take 100 mg by mouth 2 (two) times daily. (Patient not taking: Reported on 08/10/2023)      Glucagon (GVOKE HYPOPEN 1-PACK) 1 MG/0.2ML SOAJ Inject 1 mg into the skin as needed (low blood sugar with impaired consciousness). 0.4 mL 2    Glucagon (GVOKE HYPOPEN 2-PACK) 1 MG/0.2ML SOAJ Inject 1 mg into the skin as needed. For severe lows (Patient  taking differently: Inject 1 mg into the skin as needed (low blood sugar). For severe lows) 0.4 mL 3    haloperidol (HALDOL) 5 MG tablet Take 5 mg by mouth 2 (two) times daily.      HYDROcodone-acetaminophen (NORCO/VICODIN) 5-325 MG tablet Take 1 tablet by mouth every 8 (eight) hours as needed for moderate pain.      insulin aspart (NOVOLOG FLEXPEN) 100 UNIT/ML FlexPen USE UP TO 15 UNITS BEFORE MEALS THREE TIMES DAILY 15 mL 3    insulin degludec (TRESIBA FLEXTOUCH) 100 UNIT/ML FlexTouch Pen Inject 15 Units into the skin daily. 15 mL 3    Insulin Pen Needle (EASY COMFORT PEN NEEDLES) 31G X 8 MM MISC Check sugar 3x  daily (Patient taking differently: Check sugar 3x  daily) 100 each 2    Latanoprostene Bunod (VYZULTA) 0.024 % SOLN Place 1 drop into the left eye at bedtime.      levothyroxine (SYNTHROID) 50 MCG tablet Take 50 mcg by mouth daily before breakfast.       loratadine (CLARITIN) 10 MG tablet Take 10 mg by mouth daily as needed for allergies.      losartan (COZAAR) 25 MG tablet Take 25 mg by mouth daily.      OZEMPIC, 1 MG/DOSE, 4 MG/3ML SOPN Inject 1 mg into the skin once a week. wednesdays      pantoprazole (PROTONIX) 40 MG tablet Take 40 mg by mouth daily.      patiromer (VELTASSA) 8.4 g packet Take 8.4 g by mouth every other day. (Patient not taking: Reported on 08/10/2023)      Polyethyl Glycol-Propyl Glycol (SYSTANE) 0.4-0.3 % GEL ophthalmic gel Place 1 application. into both eyes 3 (three) times daily as needed (dry/irritated eyes.).      pregabalin (LYRICA) 75 MG capsule Take 75 mg by mouth 2 (two) times daily.      senna-docusate (SENOKOT-S) 8.6-50 MG tablet Take 2 tablets by mouth 2 (two) times daily. 60 tablet 0    sodium zirconium cyclosilicate (LOKELMA) 10 g PACK packet Take 10 g by mouth See admin instructions. MON and FRI       Patient Stressors: Other: return of visual hallucinations    Patient Strengths: Ability for insight  Communication skills  Motivation for  treatment/growth   Treatment Modalities: Medication  Management, Group therapy, Case management,  1 to 1 session with clinician, Psychoeducation, Recreational therapy.   Physician Treatment Plan for Primary Diagnosis: Schizoaffective disorder (HCC) Long Term Goal(s): Improvement in symptoms so as ready for discharge   Short Term Goals: Ability to identify changes in lifestyle to reduce recurrence of condition will improve Ability to verbalize feelings will improve Ability to disclose and discuss suicidal ideas Ability to demonstrate self-control will improve Ability to identify and develop effective coping behaviors will improve Ability to maintain clinical measurements within normal limits will improve Ability to identify triggers associated with substance abuse/mental health issues will improve  Medication Management: Evaluate patient's response, side effects, and tolerance of medication regimen.  Therapeutic Interventions: 1 to 1 sessions, Unit Group sessions and Medication administration.  Evaluation of Outcomes: Progressing  Physician Treatment Plan for Secondary Diagnosis: Principal Problem:   Schizoaffective disorder (HCC)  Long Term Goal(s): Improvement in symptoms so as ready for discharge   Short Term Goals: Ability to identify changes in lifestyle to reduce recurrence of condition will improve Ability to verbalize feelings will improve Ability to disclose and discuss suicidal ideas Ability to demonstrate self-control will improve Ability to identify and develop effective coping behaviors will improve Ability to maintain clinical measurements within normal limits will improve Ability to identify triggers associated with substance abuse/mental health issues will improve     Medication Management: Evaluate patient's response, side effects, and tolerance of medication regimen.  Therapeutic Interventions: 1 to 1 sessions, Unit Group sessions and Medication  administration.  Evaluation of Outcomes: Progressing   RN Treatment Plan for Primary Diagnosis: Schizoaffective disorder (HCC) Long Term Goal(s): Knowledge of disease and therapeutic regimen to maintain health will improve  Short Term Goals: Ability to remain free from injury will improve, Ability to verbalize frustration and anger appropriately will improve, Ability to participate in decision making will improve, Ability to verbalize feelings will improve, Ability to identify and develop effective coping behaviors will improve, and Compliance with prescribed medications will improve  Medication Management: RN will administer medications as ordered by provider, will assess and evaluate patient's response and provide education to patient for prescribed medication. RN will report any adverse and/or side effects to prescribing provider.  Therapeutic Interventions: 1 on 1 counseling sessions, Psychoeducation, Medication administration, Evaluate responses to treatment, Monitor vital signs and CBGs as ordered, Perform/monitor CIWA, COWS, AIMS and Fall Risk screenings as ordered, Perform wound care treatments as ordered.  Evaluation of Outcomes: Progressing   LCSW Treatment Plan for Primary Diagnosis: Schizoaffective disorder (HCC) Long Term Goal(s): Safe transition to appropriate next level of care at discharge, Engage patient in therapeutic group addressing interpersonal concerns.  Short Term Goals: Engage patient in aftercare planning with referrals and resources, Increase social support, Increase emotional regulation, Facilitate acceptance of mental health diagnosis and concerns, Identify triggers associated with mental health/substance abuse issues, and Increase skills for wellness and recovery  Therapeutic Interventions: Assess for all discharge needs, 1 to 1 time with Social worker, Explore available resources and support systems, Assess for adequacy in community support network, Educate family  and significant other(s) on suicide prevention, Complete Psychosocial Assessment, Interpersonal group therapy.  Evaluation of Outcomes: Progressing   Progress in Treatment: Attending groups: Yes Participating in groups: Yes. Taking medication as prescribed: Yes. Toleration medication: Yes. Family/Significant other contact made: Yes, individual(s) contacted:  Pt's Mother Patient understands diagnosis: Yes. Discussing patient identified problems/goals with staff: Yes. Medical problems stabilized or resolved: Yes. Denies suicidal/homicidal ideation: Yes. Issues/concerns per patient self-inventory:  Yes. Other: N/A   New problem(s) identified: No, Describe:  None reported   New Short Term/Long Term Goal(s): medication stabilization, elimination of SI thoughts, development of comprehensive mental wellness plan.    Patient Goals:  Medication stabilization   Discharge Plan or Barriers: :  Patient recently admitted. CSW will continue to follow and assess for appropriate referrals and possible discharge planning.    Reason for Continuation of Hospitalization: Depression Hallucinations Medication stabilization Suicidal ideation   Estimated Length of Stay: 5-7 Days Last 3 [place] Suicide Severity Risk Score: Flowsheet Row Ad[facility] (Current) [facility] from 08/09/2023 in [facility] ED from 06/17/2022 in [facility] Emergency Department at [facility]  C-SSRS RISK CATEGORY No Risk High Risk No Risk       Last PHQ 2/9 Scores:    08/31/2019    2:42 PM  Depression screen PHQ 2/9  Decreased Interest 0  Down, Depressed, Hopeless 0  PHQ - 2 Score 0    Scribe for Treatment Team: Izell Warrenton, LCSW 08/16/2023 9:45 AM

## 2023-08-16 NOTE — Group Note (Signed)
Recreation Therapy Group Note   Group Topic:Stress Management  Group Date: 08/16/2023 Start Time: 7829 End Time: 0956 Facilitators: Leslieann Whisman-McCall, LRT,CTRS Location: [address] Dayroom   Group Topic: Stress Management  Goal Area(s) Addresses:  Patient will identify positive stress management techniques. Patient will identify benefits of using stress management post d/c.  Group Description: Meditation. LRT played a meditation from the Calm app that focused on taking in the characteristics of a mountain. It encouraged participates to envision how the mountain stands tall and endures whatever it's confronted with (ie. Changing weather, different seasons, time of day) and encouraged patients to take on that same attitude when they are faced with the challenges of life.   Education:  Stress Management, Discharge Planning.   Education Outcome: Acknowledges Education   Affect/Mood: N/A   Participation Level: Did not attend    Clinical Observations/Individualized Feedback:     Plan: Continue to engage patient in RT group sessions 2-3x/week.   Tavi Gaughran-McCall, LRT,CTRS 08/16/2023 12:07 PM

## 2023-08-16 NOTE — Group Note (Signed)
Date:  08/16/2023 Time:  9:27 AM  Group Topic/Focus:  Goals Group:   The focus of this group is to help patients establish daily goals to achieve during treatment and discuss how the patient can incorporate goal setting into their daily lives to aide in recovery.    Participation Level:  Did Not Attend   Additional Comments:  Pt chose not to attend group  Donell Beers 08/16/2023, 9:27 AM

## 2023-08-16 NOTE — Progress Notes (Addendum)
   08/16/23 0614  Vital Signs  Pulse Rate 84  BP 103/64  BP Location Left Arm  BP Method Automatic  Patient Position (if appropriate) Sitting     08/16/23 0617  Vital Signs  Pulse Rate 100  Pulse Rate Source Monitor  BP (!) 81/32  BP Location Left Arm  BP Method Automatic  Patient Position (if appropriate) Standing  Oxygen Therapy  SpO2 97 %    Patient complained of dizziness.  No other symptoms noted. Writer has been hydrating patient with Gatorade and Water beginning last night.  Will continue to encourage fluids.

## 2023-08-16 NOTE — Progress Notes (Signed)
Patient ID: Deanna Landry, female   DOB: 1971-09-18, 52 y.o.   MRN: [id number] Pt remains on 1.1. due to fall risk and visual impairement . [name] is unsteady on her feet and remains 1.1. with staff at bedside. She is coming out of her room more and mobilizing okay with 1 assist staff member. Pt is safe, will con't to monitor.

## 2023-08-16 NOTE — Group Note (Signed)
Date:  08/16/2023 Time:  9:27 PM  Group Topic/Focus:  Developing a Wellness Toolbox:   The focus of this group is to help patients develop a "wellness toolbox" with skills and strategies to promote recovery upon discharge.    Participation Level:  Did Not Attend  Participation Quality:  Appropriate Affect:    Cognitive:    Insight:   Engagement in Group:    Modes of Intervention:    Additional Comments:    Jiro Kiester 08/16/2023, 9:27 PM

## 2023-08-16 NOTE — Progress Notes (Signed)
[facility] MD Progress Note  08/16/2023 10:08 AM Deanna Landry  MRN:  [id number] Subjective:   Deanna Landry is a 52 yr old female who presented on 9/23 to [facility] with complaints of SI due to worsening of her chronic VH, she was admitted to [facility] on 9/25. PPHx is significant for Schizoaffective Disorder Bipolar Type, Chronic Hallucinations, Anxiety, and PTSD, and 2 Suicide Attempts (last-OD on Insulin 07/2021) and 5 Prior Psychiatric Hospitalizations (last- [facility] 07/2021), and no history of Self Injurious Behavior.    Case was discussed in the multidisciplinary team. MAR was reviewed and patient was compliant with medications.  She did not receive any PRN medications yesterday.   Psychiatric Team made the following recommendations yesterday: -Increase Haldol to 10 mg BID for psychosis and mood stability.  -Decrease Wellbutrin XL to 150 mg QHS for depression and anxiety -Continue Zoloft 100 mg QHS for depression and anxiety    On interview today patient reports she slept good last night.  She reports that she is able to use her pillow to cover/block her face in such a way to stop herself seeing the bugs which is what improved her sleep.  She reports her appetite is doing good.  She reports no SI, HI, or AH.  She reports continuing to have VH and tactile hallucinations of bugs.  She reports no Paranoia or Ideas of Reference.  She reports no issues with her medications.  She reports continuing to feel anxious and down due the bugs.  She reports that she has not noticed any change in her hallucinations yet with the increase in the Haldol but she only just took the second dose this morning and knows it will take a few doses to work.  She reports she continues to have some lightheadedness/dizziness even though her BP meds have been held.  She reports not having a BM for about 7 days.  She reports no other concerns at present.    Principal Problem: Schizoaffective disorder (HCC) Diagnosis: Principal  Problem:   Schizoaffective disorder (HCC)  Total Time spent with patient:  I personally spent 35 minutes on the unit in direct patient care. The direct patient care time included face-to-face time with the patient, reviewing the patient's chart, communicating with other professionals, and coordinating care. Greater than 50% of this time was spent in counseling or coordinating care with the patient regarding goals of hospitalization, psycho-education, and discharge planning needs.   Past Psychiatric History: Schizoaffective Disorder Bipolar Type, Chronic Hallucinations, Anxiety, and PTSD, and 2 Suicide Attempts (last-OD on Insulin 07/2021) and 5 Prior Psychiatric Hospitalizations (last- [facility] 07/2021), and no history of Self Injurious Behavior.   Past Medical History:  Past Medical History:  Diagnosis Date   Acute kidney injury superimposed on CKD (HCC) 03/15/2019   Acute respiratory failure with hypoxia (HCC) 03/15/2019   Adjustment disorder with mixed anxiety and depressed mood 03/11/2019   Anemia    Anxiety    Arthritis    right ankle   Blind    right eye   CHF (congestive heart failure) (HCC)    Closed right pilon fracture, initial encounter 07/26/2019   COPD (chronic obstructive pulmonary disease) (HCC)    Coronary artery disease    Depression    Diabetes mellitus type 2 in obese 07/26/2019   Diabetes mellitus without complication (HCC)    type 1   Diabetic polyneuropathy (HCC) 05/04/2016   Dyslipidemia 01/03/2020   Fever 03/10/2019   GERD (gastroesophageal reflux disease)  History of blood transfusion    HLD (hyperlipidemia)    Hyperglycemia    Hypertension    Hypothyroidism    Ileus, postoperative (HCC)    Major depression, recurrent (HCC) 03/09/2019   MDD (major depressive disorder), severe (HCC) 03/09/2019   Myocardial infarct, old    2020, s/p DES RCA, RPDA 10/23/19   Neck pain 04/21/2016   Pneumonia 10/2019   Renal disorder    stage 3   Sepsis (HCC) 03/10/2019    Short-term memory loss    mild   Sleep apnea    study done 9/27 and 08/13/20 - no results yet 08/21/20   Stroke (HCC)    Suicidal ideation 03/15/2019   Type 1 diabetes mellitus without complication (HCC) 03/15/2019   Wound infection after surgery (Right ankle) 10/04/2019    Past Surgical History:  Procedure Laterality Date   ABDOMINAL HYSTERECTOMY  2014   ANKLE FUSION Right 08/23/2020   Procedure: ANKLE FUSION;  Surgeon: Roby Lofts, MD;  Location: [facility] OR;  Service: Orthopedics;  Laterality: Right;   APPLICATION OF WOUND VAC Right 10/11/2019   Procedure: WOUND VAC CHANGE TO RIGHT LEG;  Surgeon: Roby Lofts, MD;  Location: [facility] OR;  Service: Orthopedics;  Laterality: Right;   CORONARY ANGIOPLASTY  10/23/2019   DES RCA, DES RPDA 10/23/19 [facility])   EYE SURGERY     HARDWARE REMOVAL Right 10/06/2019   Procedure: HARDWARE REMOVAL;  Surgeon: Roby Lofts, MD;  Location: [facility] OR;  Service: Orthopedics;  Laterality: Right;   HARDWARE REMOVAL Right 08/23/2020   Procedure: HARDWARE REMOVAL;  Surgeon: Roby Lofts, MD;  Location: [facility] OR;  Service: Orthopedics;  Laterality: Right;   I & D EXTREMITY Right 10/06/2019   Procedure: IRRIGATION AND DEBRIDEMENT EXTREMITY;  Surgeon: Roby Lofts, MD;  Location: [facility] OR;  Service: Orthopedics;  Laterality: Right;   I & D EXTREMITY Right 10/09/2019   Procedure: IRRIGATION AND DEBRIDEMENT EXTREMITY and WOUND VAC CHANGE RIGHT ANKLE;  Surgeon: Roby Lofts, MD;  Location: [facility] OR;  Service: Orthopedics;  Laterality: Right;   ORIF ANKLE FRACTURE Right 07/26/2019   Procedure: OPEN REDUCTION INTERNAL FIXATION (ORIF) ANKLE FRACTURE;  Surgeon: Roby Lofts, MD;  Location: [facility] OR;  Service: Orthopedics;  Laterality: Right;  OPEN REDUCTION INTERNAL FIXATION (ORIF) ANKLE FRACTURE    TOE AMPUTATION     Family History:  Family History  Problem Relation Age of Onset   Diabetes Mellitus II Mother    Stroke Mother    Heart attack Mother    Stomach cancer Father     Congestive Heart Failure Father    Diabetes Sister    Congestive Heart Failure Maternal Grandfather    Family Psychiatric  History:  Mother- EtOH Abuse Maternal Uncle- Suicide Attempt Cousin- Heroin Abuse  Social History:  Social History   Substance and Sexual Activity  Alcohol Use Not Currently     Social History   Substance and Sexual Activity  Drug Use Not Currently    Social History   Socioeconomic History   Marital status: Divorced    Spouse name: Not on file   Number of children: Not on file   Years of education: Not on file   Highest education level: Not on file  Occupational History   Not on file  Tobacco Use   Smoking status: Never   Smokeless tobacco: Never  Vaping Use   Vaping status: Never Used  Substance and Sexual Activity   Alcohol use: Not Currently  Drug use: Not Currently   Sexual activity: Not on file    Comment: Hysterectomy  Other Topics Concern   Not on file  Social History Narrative   Not on file   Social Determinants of Health   Financial Resource Strain: Not on file  Food Insecurity: No Food Insecurity (08/10/2023)   Hunger Vital Sign    Worried About Running Out of Food in the Last Year: Never true    Ran Out of Food in the Last Year: Never true  Transportation Needs: No Transportation Needs (08/10/2023)   PRAPARE - Administrator, Civil Service (Medical): No    Lack of Transportation (Non-Medical): No  Physical Activity: Not on file  Stress: Not on file  Social Connections: Not on file   Additional Social History:                         Sleep: Good  Appetite:  Good  Current Medications: Current Facility-Administered Medications  Medication Dose Route Frequency Provider Last Rate Last Admin   acetaminophen (TYLENOL) tablet 650 mg  650 mg Oral Q6H PRN Eligha Bridegroom, NP       alum & mag hydroxide-simeth (MAALOX/MYLANTA) 200-200-20 MG/5ML suspension 30 mL  30 mL Oral Q4H PRN Eligha Bridegroom, NP        aspirin EC tablet 81 mg  81 mg Oral QHS Eligha Bridegroom, NP   81 mg at 08/15/23 2109   atorvastatin (LIPITOR) tablet 80 mg  80 mg Oral QHS Eligha Bridegroom, NP   80 mg at 08/15/23 2108   buPROPion (WELLBUTRIN XL) 24 hr tablet 150 mg  150 mg Oral QHS Rex Kras, MD   150 mg at 08/15/23 2108   clopidogrel (PLAVIX) tablet 75 mg  75 mg Oral Daily Eligha Bridegroom, NP   75 mg at 08/16/23 0748   docusate sodium (COLACE) capsule 100 mg  100 mg Oral BID Lauro Franklin, MD       haloperidol (HALDOL) tablet 10 mg  10 mg Oral BID Rex Kras, MD   10 mg at 08/16/23 0748   haloperidol (HALDOL) tablet 5 mg  5 mg Oral TID PRN Eligha Bridegroom, NP       Or   haloperidol lactate (HALDOL) injection 5 mg  5 mg Intramuscular TID PRN Eligha Bridegroom, NP       insulin aspart (novoLOG) injection 0-9 Units  0-9 Units Subcutaneous TID WC Eligha Bridegroom, NP   5 Units at 08/16/23 0612   insulin glargine-yfgn (SEMGLEE) injection 12 Units  12 Units Subcutaneous Daily Lauro Franklin, MD   12 Units at 08/16/23 0803   latanoprost (XALATAN) 0.005 % ophthalmic solution 1 drop  1 drop Left Eye QHS Eligha Bridegroom, NP   1 drop at 08/15/23 2152   levothyroxine (SYNTHROID) tablet 50 mcg  50 mcg Oral QAC breakfast Eligha Bridegroom, NP   50 mcg at 08/16/23 0606   LORazepam (ATIVAN) tablet 2 mg  2 mg Oral TID PRN Eligha Bridegroom, NP       Or   LORazepam (ATIVAN) injection 2 mg  2 mg Intramuscular TID PRN Eligha Bridegroom, NP       pantoprazole (PROTONIX) EC tablet 40 mg  40 mg Oral QHS Eligha Bridegroom, NP   40 mg at 08/15/23 2108   pregabalin (LYRICA) capsule 75 mg  75 mg Oral BID Eligha Bridegroom, NP   75 mg at 08/16/23 0748   ranolazine (RANEXA) 12 hr tablet  500 mg  500 mg Oral BID Eligha Bridegroom, NP   500 mg at 08/16/23 0748   sertraline (ZOLOFT) tablet 100 mg  100 mg Oral QHS Eligha Bridegroom, NP   100 mg at 08/15/23 2108   traZODone (DESYREL) tablet 50 mg  50 mg Oral QHS PRN Sindy Guadeloupe, NP    50 mg at 08/14/23 2121    Lab Results:  Results for orders placed or performed during the hospital encounter of 08/10/23 (from the past 48 hour(s))  Glucose, capillary     Status: Abnormal   Collection Time: 08/14/23 11:39 AM  Result Value Ref Range   Glucose-Capillary 226 (H) 70 - 99 mg/dL    Comment: Glucose reference range applies only to samples taken after fasting for at least 8 hours.  Glucose, capillary     Status: Abnormal   Collection Time: 08/14/23  4:56 PM  Result Value Ref Range   Glucose-Capillary 186 (H) 70 - 99 mg/dL    Comment: Glucose reference range applies only to samples taken after fasting for at least 8 hours.  Glucose, capillary     Status: Abnormal   Collection Time: 08/14/23  9:19 PM  Result Value Ref Range   Glucose-Capillary 316 (H) 70 - 99 mg/dL    Comment: Glucose reference range applies only to samples taken after fasting for at least 8 hours.  Glucose, capillary     Status: Abnormal   Collection Time: 08/15/23  5:48 AM  Result Value Ref Range   Glucose-Capillary 302 (H) 70 - 99 mg/dL    Comment: Glucose reference range applies only to samples taken after fasting for at least 8 hours.   Comment 1 Notify RN   Glucose, capillary     Status: Abnormal   Collection Time: 08/15/23 12:42 PM  Result Value Ref Range   Glucose-Capillary 208 (H) 70 - 99 mg/dL    Comment: Glucose reference range applies only to samples taken after fasting for at least 8 hours.   Comment 1 Notify RN    Comment 2 Document in Chart   Glucose, capillary     Status: Abnormal   Collection Time: 08/15/23  5:02 PM  Result Value Ref Range   Glucose-Capillary 184 (H) 70 - 99 mg/dL    Comment: Glucose reference range applies only to samples taken after fasting for at least 8 hours.  Glucose, capillary     Status: Abnormal   Collection Time: 08/15/23  9:05 PM  Result Value Ref Range   Glucose-Capillary 263 (H) 70 - 99 mg/dL    Comment: Glucose reference range applies only to samples  taken after fasting for at least 8 hours.  Glucose, capillary     Status: Abnormal   Collection Time: 08/16/23  6:07 AM  Result Value Ref Range   Glucose-Capillary 257 (H) 70 - 99 mg/dL    Comment: Glucose reference range applies only to samples taken after fasting for at least 8 hours.    Blood Alcohol level:  Lab Results  Component Value Date   ETH <10 08/09/2023   ETH <10 07/29/2021    Metabolic Disorder Labs: Lab Results  Component Value Date   HGBA1C 6.9 (H) 06/29/2023   MPG 151.33 11/30/2020   MPG 174.29 10/05/2019   No results found for: "PROLACTIN" Lab Results  Component Value Date   CHOL 100 08/12/2023   TRIG 168 (H) 08/12/2023   HDL 33 (L) 08/12/2023   CHOLHDL 3.0 08/12/2023   VLDL 34 08/12/2023  LDLCALC 33 08/12/2023   LDLCALC 57 09/02/2021    Physical Findings: AIMS:  , ,  ,  ,    CIWA:    COWS:     Musculoskeletal: Strength & Muscle Tone: within normal limits Gait & Station: unsteady, blind Patient leans: N/A  Psychiatric Specialty Exam:  Presentation  General Appearance:  Appropriate for Environment; Casual  Eye Contact: Other (comment) (blind)  Speech: Clear and Coherent; Normal Rate  Speech Volume: Normal  Handedness: Right   Mood and Affect  Mood: Anxious; Dysphoric  Affect: Congruent   Thought Process  Thought Processes: Coherent; Linear  Descriptions of Associations:Intact  Orientation:Full (Time, Place and Person)  Thought Content:Rumination  History of Schizophrenia/Schizoaffective disorder:Yes  Duration of Psychotic Symptoms:Greater than six months  Hallucinations:Hallucinations: Visual; Tactile Description of Visual Hallucinations: bugs  Ideas of Reference:None  Suicidal Thoughts:Suicidal Thoughts: No  Homicidal Thoughts:Homicidal Thoughts: No   Sensorium  Memory: Immediate Fair; Recent Fair  Judgment: Fair  Insight: Fair   Art therapist  Concentration: Good  Attention  Span: Good  Recall: Good  Fund of Knowledge: Good  Language: Good   Psychomotor Activity  Psychomotor Activity: Psychomotor Activity: Normal   Assets  Assets: Communication Skills; Desire for Improvement; Housing   Sleep  Sleep: Sleep: Good Number of Hours of Sleep: 8    Physical Exam: Physical Exam Vitals and nursing note reviewed.  Constitutional:      General: She is not in acute distress.    Appearance: Normal appearance. She is obese. She is not ill-appearing or toxic-appearing.  HENT:     Head: Normocephalic and atraumatic.  Pulmonary:     Effort: Pulmonary effort is normal.  Neurological:     Mental Status: She is alert.    Review of Systems  Respiratory:  Negative for cough and shortness of breath.   Cardiovascular:  Negative for chest pain.  Gastrointestinal:  Positive for constipation. Negative for abdominal pain, diarrhea, nausea and vomiting.  Neurological:  Positive for dizziness. Negative for weakness and headaches.  Psychiatric/Behavioral:  Positive for depression and hallucinations (Visual and Tactile). Negative for suicidal ideas. The patient is nervous/anxious.    Blood pressure (!) 110/57, pulse 78, temperature 98 F (36.7 C), temperature source Oral, resp. rate 18, height 5\' 2"  (1.575 m), weight 69.9 kg, SpO2 97%. Body mass index is 28.17 kg/m.   Treatment Plan Summary: Daily contact with patient to assess and evaluate symptoms and progress in treatment and Medication management  Kolbee D. Jhaveri is a 52 yr old female who presented on 9/23 to [facility] with complaints of SI due to worsening of her chronic VH, she was admitted to [facility] on 9/25.  PPHx is significant for Schizoaffective Disorder Bipolar Type, Chronic Hallucinations, Anxiety, and PTSD, and 2 Suicide Attempts (last-OD on Insulin 07/2021) and 5 Prior Psychiatric Hospitalizations (last- [facility] 07/2021), and no history of Self Injurious Behavior.     Meredeth continues to have  significant hallucinations which is causing distress and anxiety.  She has tolerated the increase in Haldol made last night so far.  It does seem like her dizziness is improving with the holding of her BP meds so we will continue to hold them.  We will stop her Isordil due to continued low BP.  We will start Colace due to her constipation.  We will continue to monitor.      Schizoaffective Disorder (r/o Bipolar Disorder): -Continue Haldol 10 mg BID for psychosis and mood stability. -Continue Agitation Protocol: Haldol/Ativan  Depression  Anxiety  PTSD: -Continue Wellbutrin XL 150 mg QHS for depression and anxiety -Continue Zoloft 100 mg QHS for depression and anxiety     Diabetes: -Continue SSI -Continue Semglee 12 units daily     HTN: -Stopped Amlodipine 2.5 mg daily -Stopped Losartan 12.5 mg daily -Stopped Lopressor 25 mg daily    Constipation: -Start Colace 100 mg BID   -Stop Isordil 20 mg BID   -Continue Aspirin EC 81 mg QHS -Continue Lipitor 80 mg QHS -Continue Plavix 75 mg daily -Continue Xalatan 0.005% 1 drop Left eye QHS -Continue Synthroid 50 mcg daily -Continue Protonix EC 40 mg QHS -Continue Lyrica 75 mg BID -Continue Ranolazine 500 mg BID -Continue PRN's: Tylenol, Maalox, Atarax, Milk of Magnesia, Trazodone   Lauro Franklin, MD 08/16/2023, 10:08 AM

## 2023-08-16 NOTE — Plan of Care (Signed)

## 2023-08-16 NOTE — Progress Notes (Signed)
Patient ID: Deanna Landry, female   DOB: 07/25/1971, 52 y.o.   MRN: [id number] Pt remains on 1.1. due to fall risk and visual impairement . [name] is unsteady on her feet and remains 1.1. with staff at bedside. She is ambulating more and was able to attend dinner. Pt is safe, will con't to monitor.

## 2023-08-17 DIAGNOSIS — F259 Schizoaffective disorder, unspecified: Secondary | ICD-10-CM | POA: Diagnosis not present

## 2023-08-17 LAB — GLUCOSE, CAPILLARY
Glucose-Capillary: 239 mg/dL — ABNORMAL HIGH (ref 70–99)
Glucose-Capillary: 315 mg/dL — ABNORMAL HIGH (ref 70–99)
Glucose-Capillary: 314 mg/dL — ABNORMAL HIGH (ref 70–99)
Glucose-Capillary: 193 mg/dL — ABNORMAL HIGH (ref 70–99)

## 2023-08-17 MED ORDER — INSULIN GLARGINE-YFGN 100 UNIT/ML ~~LOC~~ SOLN
15.0000 [IU] | Freq: Every day | SUBCUTANEOUS | Status: DC
  Administered 2023-08-18 – 2023-08-20 (×3): 15 [IU] via SUBCUTANEOUS

## 2023-08-17 MED ORDER — SERTRALINE HCL 50 MG PO TABS
150.0000 mg | ORAL_TABLET | Freq: Every day | ORAL | Status: DC
  Administered 2023-08-17 – 2023-08-19 (×3): 150 mg via ORAL
  Filled 2023-08-17 (×5): qty 3

## 2023-08-17 MED ORDER — INSULIN ASPART 100 UNIT/ML IJ SOLN
3.0000 [IU] | Freq: Three times a day (TID) | INTRAMUSCULAR | Status: DC
  Administered 2023-08-17 – 2023-08-20 (×9): 3 [IU] via SUBCUTANEOUS

## 2023-08-17 MED ORDER — ARIPIPRAZOLE 5 MG PO TABS
5.0000 mg | ORAL_TABLET | Freq: Every day | ORAL | Status: DC
  Administered 2023-08-18: 5 mg via ORAL
  Filled 2023-08-17 (×3): qty 1

## 2023-08-17 NOTE — Group Note (Unsigned)
Date:  08/18/2023 Time:  7:48 AM  Group Topic/Focus:  Goals Group:   The focus of this group is to help patients establish daily goals to achieve during treatment and discuss how the patient can incorporate goal setting into their daily lives to aide in recovery. Orientation:   The focus of this group is to educate the patient on the purpose and policies of crisis stabilization and provide a format to answer questions about their admission.  The group details unit policies and expectations of patients while admitted.    Participation Level:  Did Not Attend  Participation Quality:   n/a  Affect:   n/a  Cognitive:   n/a  Insight: None  Engagement in Group:   n/a  Modes of Intervention:   n/a  Additional Comments:   Pt did not attend.  Edmund Hilda Forestine Macho 08/18/2023, 7:48 AM

## 2023-08-17 NOTE — Progress Notes (Signed)
Nursing 1:1 Note  Pt received insulin and ambulated to hallway with sitter. Pt then went to lunch. Pt is calm and breathing is unlabored.

## 2023-08-17 NOTE — Progress Notes (Signed)
[facility] MD Progress Note  08/17/2023 3:40 PM Deanna Landry  MRN:  [id number] Subjective:   Deanna Landry is a 52 yr old female who presented on 9/23 to [facility] with complaints of SI due to worsening of her chronic VH, she was admitted to [facility] on 9/25. PPHx is significant for Schizoaffective Disorder Bipolar Type, Chronic Hallucinations, Anxiety, and PTSD, and 2 Suicide Attempts (last-OD on Insulin 07/2021) and 5 Prior Psychiatric Hospitalizations (last- [facility] 07/2021), and no history of Self Injurious Behavior.   24 hr chart review: BP low earlier today morning, must have been an error on the Dinap as SBP was recorded to be in the 50s.  Assigned RN has been asked to recheck.  Patient is compliant with scheduled medications.  Required trazodone 50 mg last night for sleep.  No agitation protocol medications administered overnight.  Has remained on one-on-one staff monitoring for the past 24 hours due to being blind.  Patient assessment note: On assessment today, the pt reports that their mood is depressed, and rated depression as 8, 10 being worse. Reports that anxiety is 7, 10 being worst. Sleep is fair, states had trouble staying asleep, and trouble falling asleep. Appetite is good Concentration is poor Energy level is low Denies suicidal thoughts.  Denies suicidal intent and plan.  Denies having any HI.  Denies having auditory hallucinations, denies paranoia, denies other first rank symptoms.  Reports visual hallucinations of bugs, and endorses tactile hallucinations of bugs crawling all over her body.  States the bugs are of various sizes and shapes.  Discussed the following psychosocial stressors: Hallucinations.  Educated on the fact that we are continuing to adjust medications, to hopefully treat and get rid of her hallucinations. Denies having side effects to current psychiatric medications.   Patient states that the Wellbutrin, Zoloft, Haldol were all started at [facility], and none of them is  helping her. Reports being on medications for 3 years, all started at [facility]. Reports dose titrations upwards have been at this hospital, and have not been helpful. States initially Haldol was helping, but stopped helping her about a month ago and the increasing dose at this hospital has not been helpful either.  We will therefore discontinued these medications (dc haldol, try abilify and titrate as needed. We will also dc wellbutrin, titrate zoloft to 150 then to 200), and we will continue to monitor.  Insulin changes completed as per diabetic consultant.  See changes below.  Her recommendations are as follows: Semglee 15 units QAM Novolog 3 units TID with meals if she consumes at least 50%  Patient has h/o CKD, we will repeat bmp tomorrow, if BUN/Cr not back to preadmission levels, will have to contact hospitalist.   Principal Problem: Schizoaffective disorder (HCC) Diagnosis: Principal Problem:   Schizoaffective disorder (HCC)  Total Time spent with patient:  I personally spent 45 minutes on the unit in direct patient care. The direct patient care time included face-to-face time with the patient, reviewing the patient's chart, communicating with other professionals, and coordinating care. Greater than 50% of this time was spent in counseling or coordinating care with the patient regarding goals of hospitalization, psycho-education, and discharge planning needs.   Past Psychiatric History: Schizoaffective Disorder Bipolar Type, Chronic Hallucinations, Anxiety, and PTSD, and 2 Suicide Attempts (last-OD on Insulin 07/2021) and 5 Prior Psychiatric Hospitalizations (last- [facility] 07/2021), and no history of Self Injurious Behavior.   Past Medical History:  Past Medical History:  Diagnosis Date  Acute kidney injury superimposed on CKD (HCC) 03/15/2019   Acute respiratory failure with hypoxia (HCC) 03/15/2019   Adjustment disorder with mixed anxiety and depressed mood 03/11/2019    Anemia    Anxiety    Arthritis    right ankle   Blind    right eye   CHF (congestive heart failure) (HCC)    Closed right pilon fracture, initial encounter 07/26/2019   COPD (chronic obstructive pulmonary disease) (HCC)    Coronary artery disease    Depression    Diabetes mellitus type 2 in obese 07/26/2019   Diabetes mellitus without complication (HCC)    type 1   Diabetic polyneuropathy (HCC) 05/04/2016   Dyslipidemia 01/03/2020   Fever 03/10/2019   GERD (gastroesophageal reflux disease)    History of blood transfusion    HLD (hyperlipidemia)    Hyperglycemia    Hypertension    Hypothyroidism    Ileus, postoperative (HCC)    Major depression, recurrent (HCC) 03/09/2019   MDD (major depressive disorder), severe (HCC) 03/09/2019   Myocardial infarct, old    2020, s/p DES RCA, RPDA 10/23/19   Neck pain 04/21/2016   Pneumonia 10/2019   Renal disorder    stage 3   Sepsis (HCC) 03/10/2019   Short-term memory loss    mild   Sleep apnea    study done 9/27 and 08/13/20 - no results yet 08/21/20   Stroke (HCC)    Suicidal ideation 03/15/2019   Type 1 diabetes mellitus without complication (HCC) 03/15/2019   Wound infection after surgery (Right ankle) 10/04/2019    Past Surgical History:  Procedure Laterality Date   ABDOMINAL HYSTERECTOMY  2014   ANKLE FUSION Right 08/23/2020   Procedure: ANKLE FUSION;  Surgeon: Roby Lofts, MD;  Location: [facility] OR;  Service: Orthopedics;  Laterality: Right;   APPLICATION OF WOUND VAC Right 10/11/2019   Procedure: WOUND VAC CHANGE TO RIGHT LEG;  Surgeon: Roby Lofts, MD;  Location: [facility] OR;  Service: Orthopedics;  Laterality: Right;   CORONARY ANGIOPLASTY  10/23/2019   DES RCA, DES RPDA 10/23/19 [facility])   EYE SURGERY     HARDWARE REMOVAL Right 10/06/2019   Procedure: HARDWARE REMOVAL;  Surgeon: Roby Lofts, MD;  Location: [facility] OR;  Service: Orthopedics;  Laterality: Right;   HARDWARE REMOVAL Right 08/23/2020   Procedure: HARDWARE REMOVAL;  Surgeon:  Roby Lofts, MD;  Location: [facility] OR;  Service: Orthopedics;  Laterality: Right;   I & D EXTREMITY Right 10/06/2019   Procedure: IRRIGATION AND DEBRIDEMENT EXTREMITY;  Surgeon: Roby Lofts, MD;  Location: [facility] OR;  Service: Orthopedics;  Laterality: Right;   I & D EXTREMITY Right 10/09/2019   Procedure: IRRIGATION AND DEBRIDEMENT EXTREMITY and WOUND VAC CHANGE RIGHT ANKLE;  Surgeon: Roby Lofts, MD;  Location: [facility] OR;  Service: Orthopedics;  Laterality: Right;   ORIF ANKLE FRACTURE Right 07/26/2019   Procedure: OPEN REDUCTION INTERNAL FIXATION (ORIF) ANKLE FRACTURE;  Surgeon: Roby Lofts, MD;  Location: [facility] OR;  Service: Orthopedics;  Laterality: Right;  OPEN REDUCTION INTERNAL FIXATION (ORIF) ANKLE FRACTURE    TOE AMPUTATION     Family History:  Family History  Problem Relation Age of Onset   Diabetes Mellitus II Mother    Stroke Mother    Heart attack Mother    Stomach cancer Father    Congestive Heart Failure Father    Diabetes Sister    Congestive Heart Failure Maternal Grandfather    Family Psychiatric  History:  Mother- EtOH Abuse Maternal Uncle- Suicide Attempt Cousin- Heroin Abuse  Social History:  Social History   Substance and Sexual Activity  Alcohol Use Not Currently     Social History   Substance and Sexual Activity  Drug Use Not Currently    Social History   Socioeconomic History   Marital status: Divorced    Spouse name: Not on file   Number of children: Not on file   Years of education: Not on file   Highest education level: Not on file  Occupational History   Not on file  Tobacco Use   Smoking status: Never   Smokeless tobacco: Never  Vaping Use   Vaping status: Never Used  Substance and Sexual Activity   Alcohol use: Not Currently   Drug use: Not Currently   Sexual activity: Not on file    Comment: Hysterectomy  Other Topics Concern   Not on file  Social History Narrative   Not on file   Social Determinants of Health   Financial  Resource Strain: Not on file  Food Insecurity: No Food Insecurity (08/10/2023)   Hunger Vital Sign    Worried About Running Out of Food in the Last Year: Never true    Ran Out of Food in the Last Year: Never true  Transportation Needs: No Transportation Needs (08/10/2023)   PRAPARE - Administrator, Civil Service (Medical): No    Lack of Transportation (Non-Medical): No  Physical Activity: Not on file  Stress: Not on file  Social Connections: Not on file   Additional Social History:                         Sleep: Good  Appetite:  Good  Current Medications: Current Facility-Administered Medications  Medication Dose Route Frequency Provider Last Rate Last Admin   acetaminophen (TYLENOL) tablet 650 mg  650 mg Oral Q6H PRN Eligha Bridegroom, NP       alum & mag hydroxide-simeth (MAALOX/MYLANTA) 200-200-20 MG/5ML suspension 30 mL  30 mL Oral Q4H PRN Eligha Bridegroom, NP       [START ON 08/18/2023] ARIPiprazole (ABILIFY) tablet 5 mg  5 mg Oral Daily Starleen Blue, NP       aspirin EC tablet 81 mg  81 mg Oral QHS Eligha Bridegroom, NP   81 mg at 08/16/23 2112   atorvastatin (LIPITOR) tablet 80 mg  80 mg Oral QHS Eligha Bridegroom, NP   80 mg at 08/16/23 2112   clopidogrel (PLAVIX) tablet 75 mg  75 mg Oral Daily Eligha Bridegroom, NP   75 mg at 08/17/23 0759   docusate sodium (COLACE) capsule 100 mg  100 mg Oral BID Lauro Franklin, MD   100 mg at 08/17/23 0759   haloperidol (HALDOL) tablet 5 mg  5 mg Oral TID PRN Eligha Bridegroom, NP       Or   haloperidol lactate (HALDOL) injection 5 mg  5 mg Intramuscular TID PRN Eligha Bridegroom, NP       insulin aspart (novoLOG) injection 0-9 Units  0-9 Units Subcutaneous TID WC Eligha Bridegroom, NP   7 Units at 08/17/23 1207   insulin aspart (novoLOG) injection 3 Units  3 Units Subcutaneous TID WC Laysa Kimmey, NP       [START ON 08/18/2023] insulin glargine-yfgn (SEMGLEE) injection 15 Units  15 Units Subcutaneous Daily Deon Duer,  Kainalu Heggs, NP       latanoprost (XALATAN) 0.005 % ophthalmic solution 1 drop  1  drop Left Eye QHS Eligha Bridegroom, NP   1 drop at 08/16/23 2112   levothyroxine (SYNTHROID) tablet 50 mcg  50 mcg Oral QAC breakfast Eligha Bridegroom, NP   50 mcg at 08/17/23 0636   LORazepam (ATIVAN) tablet 2 mg  2 mg Oral TID PRN Eligha Bridegroom, NP       Or   LORazepam (ATIVAN) injection 2 mg  2 mg Intramuscular TID PRN Eligha Bridegroom, NP       pantoprazole (PROTONIX) EC tablet 40 mg  40 mg Oral QHS Eligha Bridegroom, NP   40 mg at 08/16/23 2112   pregabalin (LYRICA) capsule 75 mg  75 mg Oral BID Eligha Bridegroom, NP   75 mg at 08/17/23 0759   ranolazine (RANEXA) 12 hr tablet 500 mg  500 mg Oral BID Eligha Bridegroom, NP   500 mg at 08/17/23 0759   sertraline (ZOLOFT) tablet 150 mg  150 mg Oral QHS Vernon Ariel, NP       traZODone (DESYREL) tablet 50 mg  50 mg Oral QHS PRN Sindy Guadeloupe, NP   50 mg at 08/14/23 2121    Lab Results:  Results for orders placed or performed during the hospital encounter of 08/10/23 (from the past 48 hour(s))  Glucose, capillary     Status: Abnormal   Collection Time: 08/15/23  5:02 PM  Result Value Ref Range   Glucose-Capillary 184 (H) 70 - 99 mg/dL    Comment: Glucose reference range applies only to samples taken after fasting for at least 8 hours.  Glucose, capillary     Status: Abnormal   Collection Time: 08/15/23  9:05 PM  Result Value Ref Range   Glucose-Capillary 263 (H) 70 - 99 mg/dL    Comment: Glucose reference range applies only to samples taken after fasting for at least 8 hours.  Glucose, capillary     Status: Abnormal   Collection Time: 08/16/23  6:07 AM  Result Value Ref Range   Glucose-Capillary 257 (H) 70 - 99 mg/dL    Comment: Glucose reference range applies only to samples taken after fasting for at least 8 hours.  Glucose, capillary     Status: Abnormal   Collection Time: 08/16/23 11:58 AM  Result Value Ref Range   Glucose-Capillary 307 (H) 70 - 99 mg/dL     Comment: Glucose reference range applies only to samples taken after fasting for at least 8 hours.  Glucose, capillary     Status: Abnormal   Collection Time: 08/16/23  4:45 PM  Result Value Ref Range   Glucose-Capillary 169 (H) 70 - 99 mg/dL    Comment: Glucose reference range applies only to samples taken after fasting for at least 8 hours.  Glucose, capillary     Status: Abnormal   Collection Time: 08/16/23  8:09 PM  Result Value Ref Range   Glucose-Capillary 214 (H) 70 - 99 mg/dL    Comment: Glucose reference range applies only to samples taken after fasting for at least 8 hours.  Glucose, capillary     Status: Abnormal   Collection Time: 08/17/23  6:28 AM  Result Value Ref Range   Glucose-Capillary 239 (H) 70 - 99 mg/dL    Comment: Glucose reference range applies only to samples taken after fasting for at least 8 hours.  Glucose, capillary     Status: Abnormal   Collection Time: 08/17/23 11:36 AM  Result Value Ref Range   Glucose-Capillary 315 (H) 70 - 99 mg/dL    Comment: Glucose reference  range applies only to samples taken after fasting for at least 8 hours.   Comment 1 Notify RN     Blood Alcohol level:  Lab Results  Component Value Date   ETH <10 08/09/2023   ETH <10 07/29/2021    Metabolic Disorder Labs: Lab Results  Component Value Date   HGBA1C 6.9 (H) 06/29/2023   MPG 151.33 11/30/2020   MPG 174.29 10/05/2019   No results found for: "PROLACTIN" Lab Results  Component Value Date   CHOL 100 08/12/2023   TRIG 168 (H) 08/12/2023   HDL 33 (L) 08/12/2023   CHOLHDL 3.0 08/12/2023   VLDL 34 08/12/2023   LDLCALC 33 08/12/2023   LDLCALC 57 09/02/2021    Physical Findings: AIMS:  , ,  ,  ,    CIWA:    COWS:     Musculoskeletal: Strength & Muscle Tone: within normal limits Gait & Station: unsteady, blind Patient leans: N/A  Psychiatric Specialty Exam:  Presentation  General Appearance:  Casual; Fairly Groomed  Eye Contact: Other (comment)  (blind)  Speech: Clear and Coherent  Speech Volume: Normal  Handedness: Right   Mood and Affect  Mood: Depressed; Anxious  Affect: Congruent   Thought Process  Thought Processes: Coherent  Descriptions of Associations:Intact  Orientation:Full (Time, Place and Person)  Thought Content:Logical  History of Schizophrenia/Schizoaffective disorder:Yes  Duration of Psychotic Symptoms:Less than six months  Hallucinations:Hallucinations: Visual; Tactile Description of Visual Hallucinations: bug crawling, on her  Ideas of Reference:None  Suicidal Thoughts:Suicidal Thoughts: No  Homicidal Thoughts:Homicidal Thoughts: No   Sensorium  Memory: Immediate Good  Judgment: Fair  Insight: Fair   Art therapist  Concentration: Fair  Attention Span: Fair  Recall: [employer] of Knowledge: Fair  Language: Fair   Psychomotor Activity  Psychomotor Activity: Psychomotor Activity: Normal   Assets  Assets: Resilience   Sleep  Sleep: Sleep: Fair Number of Hours of Sleep: 8    Physical Exam: Physical Exam Constitutional:      General: She is not in acute distress.    Appearance: Normal appearance. She is normal weight. She is not ill-appearing or toxic-appearing.  HENT:     Head: Normocephalic and atraumatic.  Pulmonary:     Effort: Pulmonary effort is normal.  Neurological:     General: No focal deficit present.     Mental Status: She is alert and oriented to person, place, and time. Mental status is at baseline.  Psychiatric:        Mood and Affect: Mood normal.        Behavior: Behavior normal.    Review of Systems  Constitutional: Negative.  Negative for fever.  Respiratory:  Negative for cough and shortness of breath.   Cardiovascular:  Negative for chest pain.  Gastrointestinal:  Negative for nausea and vomiting.  Skin: Negative.   Neurological:  Positive for dizziness. Negative for weakness and headaches.   Psychiatric/Behavioral:  Positive for depression, hallucinations and substance abuse. Negative for memory loss and suicidal ideas. The patient is nervous/anxious and has insomnia.    Blood pressure (!) 93/59, pulse 73, temperature 98.1 F (36.7 C), temperature source Oral, resp. rate 18, height 5\' 2"  (1.575 m), weight 69.9 kg, SpO2 99%. Body mass index is 28.17 kg/m.  Treatment Plan Summary: Daily contact with patient to assess and evaluate symptoms and progress in treatment and Medication management  Safety and Monitoring: Voluntary admission to inpatient psychiatric unit for safety, stabilization and treatment Daily contact with patient to assess and  evaluate symptoms and progress in treatment Patient's case to be discussed in multi-disciplinary team meeting Observation Level : 1:1 staff monitoring for safety Vital signs: q12 hours Precautions: Safety  Long Term Goal(s): Improvement in symptoms so as ready for discharge  Short Term Goals: Ability to identify changes in lifestyle to reduce recurrence of condition will improve, Ability to verbalize feelings will improve, Ability to disclose and discuss suicidal ideas, Ability to demonstrate self-control will improve, Ability to identify and develop effective coping behaviors will improve, Ability to maintain clinical measurements within normal limits will improve, and Compliance with prescribed medications will improve  Diagnoses Principal Problem:   Schizoaffective disorder (HCC)  Medications -Start Abilify 5 mg daily on 10/2 for psychosis --Increase Zoloft to 150 mg for MDD & GAD -Discontinued Wellbutrin XL150 mg at bedtime due to lack of effectiveness -Discontinue Haldol 10 mg BID due to lack of effectiveness -Continue Agitation Protocol: Haldol/Ativan  Medications for medical reasons  Diabetes: -Semglee 15 units QAM as per diabetic consultant   -Novolog 3 units TID with meals if she consumes at least 50% -Continue NovoLog as  per sliding scale-please see the MAR  HTN: On 08/13/2023, patient is noted to be hypotensive. -Continue Amlodipine 2.5 mg daily (hold until further notice starting 08/13/2023) -Continue Losartan 12.5 mg daily (hold until further notice starting on 08/13/2023) -Hold all antihypertensives as patient has continued to present with hypotension.   -Continue Aspirin EC 81 mg QHS -Continue Lipitor 80 mg QHS -Continue Plavix 75 mg daily -Continue Isordil 20 mg BID -Continue Xalatan 0.005% 1 drop Left eye QHS -Continue Synthroid 50 mcg daily -Continue Protonix EC 40 mg QHS -Continue Lyrica 75 mg BID -Continue Ranolazine 500 mg BID -Continue PRN's: Tylenol, Maalox, Atarax, Milk of Magnesia, Trazodone  Other PRNS -Continue Tylenol 650 mg every 6 hours PRN for mild pain -Continue Maalox 30 mg every 4 hrs PRN for indigestion -Continue Milk of Magnesia as needed every 6 hrs for constipation  Labs reviewed: Repeat BMP on 10/2,  Discharge Planning: Social work and case management to assist with discharge planning and identification of hospital follow-up needs prior to discharge Estimated LOS: 5-7 days Discharge Concerns: Need to establish a safety plan; Medication compliance and effectiveness Discharge Goals: Return home with outpatient referrals for mental health follow-up including medication management/psychotherapy  Starleen Blue, NP 08/17/2023, 3:40 PM Patient ID: Deanna Landry, female   DOB: 15-Aug-1971, 52 y.o.   MRN: [id number] Patient ID: Deanna Landry, female   DOB: Oct 03, 1971, 52 y.o.   MRN: [id number]

## 2023-08-17 NOTE — Progress Notes (Signed)
Nursing 1:1 Note  Patient appears guarded. Patient denies SI/HI/AH. Pt reports seeing bugs crawling around room. Pt reports anxiety is 7/10 and depression is 8/10. Pt reports fair sleep and good appetite. Patient complied with morning medication with no reported side effects. Patient remains safe on Q69min checks and contracts for safety.      08/17/23 0836  Psych Admission Type (Psych Patients Only)  Admission Status Voluntary  Psychosocial Assessment  Patient Complaints Depression;Anxiety  Eye Contact None (blind right eye, low vision left eye)  Facial Expression Animated  Affect Depressed  Speech Logical/coherent  Interaction Cautious  Motor Activity Slow  Appearance/Hygiene Unremarkable  Behavior Characteristics Cooperative;Anxious  Mood Pleasant  Thought Process  Coherency WDL  Content WDL  Delusions None reported or observed  Perception Hallucinations  Hallucination Visual  Judgment Limited  Confusion None  Danger to Self  Current suicidal ideation? Denies  Agreement Not to Harm Self Yes  Description of Agreement verbal  Danger to Others  Danger to Others None reported or observed

## 2023-08-17 NOTE — Group Note (Signed)
LCSW Group Therapy Note   Group Date: 08/17/2023 Start Time: 1100 End Time: 1200   Type of Therapy and Topic:  Group Therapy - Coping Skills For Anxiety and Depression  Participation Level:  Did Not Attend   Description of Group The focus of this group was to determine what healthy coping techniques would be helpful for group members in coping with anxiety and depression in their daily lives. The group began with patients introducing themselves and revealing one healthy and one unhealthy way they have coped with anxiety or depression in the past. Patients were guided through different techniques for coping with anxiety in a healthy way, including deep breathing, progressive muscle relaxation, challenging irrational thoughts, and mental imagery. Patients were then guided though different techniques for coping with depression in a healthy way, including behavioral activation, increasing social supports, focusing on positive experiences, and mindfulness. Differences between healthy and unhealthy coping techniques were pointed out when brought up by group members. Patients were asked to identify 2-3 healthy coping skills they would like to learn to use more effectively after being guided through these different techniques.These were explained, samples demonstrated, and resources shared for how to learn more at discharge.  Therapeutic Goals Patients learned that coping is what human beings do to deal with various situations in their lives. Patients learned various healthy coping techniques for anxiety and depression Patients determined 2-3 healthy coping skills they would like to become more familiar with and use more often. Patients provided support and ideas to each other.   Summary of Patient Progress:    Did not attend group    Therapeutic Modalities Cognitive Behavioral Therapy Motivational Interviewing Dialectical Behavioral Therapy  Izell [place], LCSW 08/17/2023  1:28 PM

## 2023-08-17 NOTE — Group Note (Signed)
Recreation Therapy Group Note   Group Topic:Animal Assisted Therapy   Group Date: 08/17/2023 Start Time: 0946 End Time: 1030 Facilitators: Suni Jarnagin-McCall, LRT,CTRS Location: [address] Dayroom   Animal-Assisted Activity (AAA) Program Checklist/Progress Notes Patient Eligibility Criteria Checklist & Daily Group note for Rec Tx Intervention  AAA/T Program Assumption of Risk Form signed by Patient/ or Parent Legal Guardian Yes  Patient understands his/her participation is voluntary Yes  Education: Charity fundraiser, Appropriate Animal Interaction   Education Outcome: Acknowledges education.    Affect/Mood: N/A   Participation Level: Did not attend    Clinical Observations/Individualized Feedback:     Plan: Continue to engage patient in RT group sessions 2-3x/week.   Suzette Flagler-McCall, LRT,CTRS 08/17/2023 1:37 PM

## 2023-08-17 NOTE — Progress Notes (Signed)
1:1 note Pt observed lying in bed in her room at the beginning of the shift resting with eyes closed. Pt reported good day, good appetite, took all her medications without problems. Denied SI/HI and verbally contracted fro safety. Remains on 1:1 for safety, will continue to monitor.

## 2023-08-17 NOTE — Plan of Care (Signed)

## 2023-08-17 NOTE — Progress Notes (Signed)
1:1 Note Pt is a sleep at this time, no falls witnessed or reported. Remain on 1:1 obs for safety, will continue to monitor.

## 2023-08-17 NOTE — Plan of Care (Signed)

## 2023-08-17 NOTE — Progress Notes (Signed)
Nursing 1:1 Note   Pt in bed resting with eyes closed. Pt reported feeling dizzy. Fluids were encouraged. Pt breathing is calm and unlabored.

## 2023-08-17 NOTE — Inpatient Diabetes Management (Signed)
Inpatient Diabetes Program Recommendations  AACE/ADA: New Consensus Statement on Inpatient Glycemic Control (2015)  Target Ranges:  Prepandial:   less than 140 mg/dL      Peak postprandial:   less than 180 mg/dL (1-2 hours)      Critically ill patients:  140 - 180 mg/dL   Lab Results  Component Value Date   GLUCAP 239 (H) 08/17/2023   HGBA1C 6.9 (H) 06/29/2023    Review of Glycemic Control  Latest Reference Range & Units 08/16/23 06:07 08/16/23 11:58 08/16/23 16:45 08/16/23 20:09 08/17/23 06:28  Glucose-Capillary 70 - 99 mg/dL 409 (H) 811 (H) 914 (H) 214 (H) 239 (H)  (H): Data is abnormally high  Diabetes history: DM2  Outpatient Diabetes medications:  Ozempic 1 mg weekly Tresiba 15 units every day Novolog 15 units TID with meals  Current orders for Inpatient glycemic control:  Semglee 12 units every day Novolog 0-9 units TID  Inpatient Diabetes Program Recommendations:    Please consider:  Semglee 15 units QAM Novolog 3 units TID with meals if she consumes at least 50%  Will continue to follow while inpatient.  Thank you, Dulce Sellar, MSN, CDCES Diabetes Coordinator Inpatient Diabetes Program [phone] (team pager from 8a-5p)

## 2023-08-18 ENCOUNTER — Ambulatory Visit: Payer: Medicare HMO | Admitting: "Endocrinology

## 2023-08-18 DIAGNOSIS — F259 Schizoaffective disorder, unspecified: Secondary | ICD-10-CM | POA: Diagnosis not present

## 2023-08-18 DIAGNOSIS — G4700 Insomnia, unspecified: Secondary | ICD-10-CM | POA: Diagnosis present

## 2023-08-18 DIAGNOSIS — F411 Generalized anxiety disorder: Secondary | ICD-10-CM | POA: Diagnosis present

## 2023-08-18 LAB — BASIC METABOLIC PANEL
Anion gap: 9 (ref 5–15)
BUN: 33 mg/dL — ABNORMAL HIGH (ref 6–20)
CO2: 26 mmol/L (ref 22–32)
Calcium: 9.1 mg/dL (ref 8.9–10.3)
Chloride: 100 mmol/L (ref 98–111)
Creatinine, Ser: 1.84 mg/dL — ABNORMAL HIGH (ref 0.44–1.00)
GFR, Estimated: 33 mL/min — ABNORMAL LOW (ref >60)
Glucose, Bld: 380 mg/dL — ABNORMAL HIGH (ref 70–99)
Potassium: 4.6 mmol/L (ref 3.5–5.1)
Sodium: 135 mmol/L (ref 135–145)

## 2023-08-18 LAB — GLUCOSE, CAPILLARY
Glucose-Capillary: 351 mg/dL — ABNORMAL HIGH (ref 70–99)
Glucose-Capillary: 208 mg/dL — ABNORMAL HIGH (ref 70–99)
Glucose-Capillary: 130 mg/dL — ABNORMAL HIGH (ref 70–99)
Glucose-Capillary: 86 mg/dL (ref 70–99)

## 2023-08-18 MED ORDER — ARIPIPRAZOLE 10 MG PO TABS
10.0000 mg | ORAL_TABLET | Freq: Every day | ORAL | Status: DC
  Administered 2023-08-19 – 2023-08-20 (×2): 10 mg via ORAL
  Filled 2023-08-18 (×4): qty 1

## 2023-08-18 NOTE — Progress Notes (Signed)
Pt showered, change clothes with 1:1 sitter's assistance. Tolerates meals, fluids and medications well this evening. 1:1 precautions maintained without incident to note at this time.

## 2023-08-18 NOTE — Progress Notes (Signed)
1:1 Note Pt took all her night meds with ease and tolerated well, snacks provided. Pt the went to bed and has been a sleep since, will continue to monitor.

## 2023-08-18 NOTE — Progress Notes (Signed)
   08/17/23 2314  Psych Admission Type (Psych Patients Only)  Admission Status Voluntary  Psychosocial Assessment  Patient Complaints Depression  Eye Contact Fair  Facial Expression Animated  Affect Depressed  Speech Soft  Interaction Cautious  Motor Activity Slow  Appearance/Hygiene Unremarkable  Behavior Characteristics Cooperative;Appropriate to situation  Mood Pleasant  Thought Process  Coherency WDL  Content WDL  Delusions None reported or observed  Perception Hallucinations  Hallucination Visual  Judgment Limited  Confusion None  Danger to Self  Current suicidal ideation? Denies  Agreement Not to Harm Self Yes  Description of Agreement verbal  Danger to Others  Danger to Others None reported or observed

## 2023-08-18 NOTE — Progress Notes (Signed)
   08/18/23 2222  Psych Admission Type (Psych Patients Only)  Admission Status Voluntary  Psychosocial Assessment  Patient Complaints Depression  Eye Contact Fair  Facial Expression Anxious  Affect Appropriate to circumstance  Speech Logical/coherent;Soft  Interaction Assertive;Cautious  Motor Activity Slow  Appearance/Hygiene Unremarkable  Behavior Characteristics Cooperative;Appropriate to situation  Mood Pleasant  Aggressive Behavior  Effect No apparent injury  Thought Process  Coherency WDL  Content WDL  Delusions None reported or observed  Perception Hallucinations  Hallucination Visual  Judgment Limited  Confusion None  Danger to Self  Current suicidal ideation? Denies  Agreement Not to Harm Self Yes  Description of Agreement verbal  Danger to Others  Danger to Others None reported or observed

## 2023-08-18 NOTE — Progress Notes (Signed)
1:1 Note Pt observed in bed a sleep at the start of the shift, staff by the bed side. No issues noted, will continue to monitor.

## 2023-08-18 NOTE — Progress Notes (Signed)
Pt asleep in bed, tolerated lunch and medications well. Cooperative with CBG monitoring. Safety maintained on 1:1 precaution without incident.

## 2023-08-18 NOTE — Progress Notes (Cosign Needed Addendum)
[facility] MD Progress Note  08/18/2023 5:26 PM Sapir D Kalbfleisch  MRN:  [id number] Subjective:   Deanna Landry is a 52 yr old female who presented on 9/23 to [facility] with complaints of SI due to worsening of her chronic VH, she was admitted to [facility] on 9/25. PPHx is significant for Schizoaffective Disorder Bipolar Type, Chronic Hallucinations, Anxiety, and PTSD, and 2 Suicide Attempts (last-OD on Insulin 07/2021) and 5 Prior Psychiatric Hospitalizations (last- [facility] 07/2021), and no history of Self Injurious Behavior.   24 hr chart review: The signs currently within normal limits, heart rate recorded earlier today morning is 177 which is most likely an arrow, blood pressure at that time was also recorded at 72/55, most likely an error at that time as well.  Patient is compliant with scheduled medications, reports a good night sleep, noted to have slept well per nursing.  Did not require any as needed medication overnight.  Patient assessment note: Patient remains on one-on-one staff monitoring for her safety, since she is blind.  She continues to verbalize feeling very depressed today, rates depression as 7, 10 being worst.  Rates anxiety as 8, 10 being worse, reports her stressor as being the visual hallucinations that she has.  Continuing to report that she is seeing bugs crawling, states that she is seeing various sizes.  States that the visual hallucinations have not reduced in intensity or frequency since hospitalization, and nothing has helped them since 2 months ago that this started.  She reports that the AVH is disruptive to her life, and it is bothersome.  Patient verbalizing frustration, and concerns that they were never ending.  Empathy and active listening provided, patient educated on the fact that we are doing everything we can including medication adjustments with a goal to get rid of the visual hallucinations.  She denied AH, denies SI, denies HI.  She reports that she is tolerating current  medications well, denies side effects, denies being in any physical pain, states that she had a bowel movement yesterday, reports that she strained to go. Agreeable to Colace being ordered to make it easier to have a BM. visual hallucinations which patient states begun 2 months ago remain persistent. we will complete the first time psychosis workup which include a CT scan of her head.  We are trying to rule out medical etiologies for patient's current presentation since visual hallucinations have remained very resistant to treatment with antipsychotic medications.   We will increase Abilify to 10 mg starting tomorrow, and continue all other medications as listed below.  Patient continues to require one-to-one monitoring while on the unit, since she is blind and requiring continuous assistance from staff with ADLs and ambulation to bathroom etc. we will continue to revisit discharge on a daily basis, continues hospitalization remains necessary at this time, as patient remains significantly depressed, and there has been no changes in her visual hallucinations since admission.  Patient has h/o CKD repeat BUN/Cr show very minor improvements from previous numbers.   Principal Problem: Schizoaffective disorder (HCC) Diagnosis: Principal Problem:   Schizoaffective disorder (HCC) Active Problems:   Dyslipidemia   Blindness and low vision   Diabetes (HCC)   Insomnia   GAD (generalized anxiety disorder)  Total Time spent with patient:  I personally spent 45 minutes on the unit in direct patient care. The direct patient care time included face-to-face time with the patient, reviewing the patient's chart, communicating with other professionals, and coordinating care. Greater than 50% of  this time was spent in counseling or coordinating care with the patient regarding goals of hospitalization, psycho-education, and discharge planning needs.   Past Psychiatric History: Schizoaffective Disorder Bipolar Type,  Chronic Hallucinations, Anxiety, and PTSD, and 2 Suicide Attempts (last-OD on Insulin 07/2021) and 5 Prior Psychiatric Hospitalizations (last- [facility] 07/2021), and no history of Self Injurious Behavior.   Past Medical History:  Past Medical History:  Diagnosis Date   Acute kidney injury superimposed on CKD (HCC) 03/15/2019   Acute respiratory failure with hypoxia (HCC) 03/15/2019   Adjustment disorder with mixed anxiety and depressed mood 03/11/2019   Anemia    Anxiety    Arthritis    right ankle   Blind    right eye   CHF (congestive heart failure) (HCC)    Closed right pilon fracture, initial encounter 07/26/2019   COPD (chronic obstructive pulmonary disease) (HCC)    Coronary artery disease    Depression    Diabetes mellitus type 2 in obese 07/26/2019   Diabetes mellitus without complication (HCC)    type 1   Diabetic polyneuropathy (HCC) 05/04/2016   Dyslipidemia 01/03/2020   Fever 03/10/2019   GERD (gastroesophageal reflux disease)    History of blood transfusion    HLD (hyperlipidemia)    Hyperglycemia    Hypertension    Hypothyroidism    Ileus, postoperative (HCC)    Major depression, recurrent (HCC) 03/09/2019   MDD (major depressive disorder), severe (HCC) 03/09/2019   Myocardial infarct, old    2020, s/p DES RCA, RPDA 10/23/19   Neck pain 04/21/2016   Pneumonia 10/2019   Renal disorder    stage 3   Sepsis (HCC) 03/10/2019   Short-term memory loss    mild   Sleep apnea    study done 9/27 and 08/13/20 - no results yet 08/21/20   Stroke (HCC)    Suicidal ideation 03/15/2019   Type 1 diabetes mellitus without complication (HCC) 03/15/2019   Wound infection after surgery (Right ankle) 10/04/2019    Past Surgical History:  Procedure Laterality Date   ABDOMINAL HYSTERECTOMY  2014   ANKLE FUSION Right 08/23/2020   Procedure: ANKLE FUSION;  Surgeon: Roby Lofts, MD;  Location: [facility] OR;  Service: Orthopedics;  Laterality: Right;   APPLICATION OF WOUND VAC Right 10/11/2019    Procedure: WOUND VAC CHANGE TO RIGHT LEG;  Surgeon: Roby Lofts, MD;  Location: [facility] OR;  Service: Orthopedics;  Laterality: Right;   CORONARY ANGIOPLASTY  10/23/2019   DES RCA, DES RPDA 10/23/19 [facility])   EYE SURGERY     HARDWARE REMOVAL Right 10/06/2019   Procedure: HARDWARE REMOVAL;  Surgeon: Roby Lofts, MD;  Location: [facility] OR;  Service: Orthopedics;  Laterality: Right;   HARDWARE REMOVAL Right 08/23/2020   Procedure: HARDWARE REMOVAL;  Surgeon: Roby Lofts, MD;  Location: [facility] OR;  Service: Orthopedics;  Laterality: Right;   I & D EXTREMITY Right 10/06/2019   Procedure: IRRIGATION AND DEBRIDEMENT EXTREMITY;  Surgeon: Roby Lofts, MD;  Location: [facility] OR;  Service: Orthopedics;  Laterality: Right;   I & D EXTREMITY Right 10/09/2019   Procedure: IRRIGATION AND DEBRIDEMENT EXTREMITY and WOUND VAC CHANGE RIGHT ANKLE;  Surgeon: Roby Lofts, MD;  Location: [facility] OR;  Service: Orthopedics;  Laterality: Right;   ORIF ANKLE FRACTURE Right 07/26/2019   Procedure: OPEN REDUCTION INTERNAL FIXATION (ORIF) ANKLE FRACTURE;  Surgeon: Roby Lofts, MD;  Location: [facility] OR;  Service: Orthopedics;  Laterality: Right;  OPEN REDUCTION INTERNAL FIXATION (ORIF)  ANKLE FRACTURE    TOE AMPUTATION     Family History:  Family History  Problem Relation Age of Onset   Diabetes Mellitus II Mother    Stroke Mother    Heart attack Mother    Stomach cancer Father    Congestive Heart Failure Father    Diabetes Sister    Congestive Heart Failure Maternal Grandfather    Family Psychiatric  History:  Mother- EtOH Abuse Maternal Uncle- Suicide Attempt Cousin- Heroin Abuse  Social History:  Social History   Substance and Sexual Activity  Alcohol Use Not Currently     Social History   Substance and Sexual Activity  Drug Use Not Currently    Social History   Socioeconomic History   Marital status: Divorced    Spouse name: Not on file   Number of children: Not on file   Years of education: Not on file    Highest education level: Not on file  Occupational History   Not on file  Tobacco Use   Smoking status: Never   Smokeless tobacco: Never  Vaping Use   Vaping status: Never Used  Substance and Sexual Activity   Alcohol use: Not Currently   Drug use: Not Currently   Sexual activity: Not on file    Comment: Hysterectomy  Other Topics Concern   Not on file  Social History Narrative   Not on file   Social Determinants of Health   Financial Resource Strain: Not on file  Food Insecurity: No Food Insecurity (08/10/2023)   Hunger Vital Sign    Worried About Running Out of Food in the Last Year: Never true    Ran Out of Food in the Last Year: Never true  Transportation Needs: No Transportation Needs (08/10/2023)   PRAPARE - Administrator, Civil Service (Medical): No    Lack of Transportation (Non-Medical): No  Physical Activity: Not on file  Stress: Not on file  Social Connections: Not on file   Additional Social History:                         Sleep: Good  Appetite:  Good  Current Medications: Current Facility-Administered Medications  Medication Dose Route Frequency Provider Last Rate Last Admin   acetaminophen (TYLENOL) tablet 650 mg  650 mg Oral Q6H PRN Eligha Bridegroom, NP       alum & mag hydroxide-simeth (MAALOX/MYLANTA) 200-200-20 MG/5ML suspension 30 mL  30 mL Oral Q4H PRN Eligha Bridegroom, NP       [START ON 08/19/2023] ARIPiprazole (ABILIFY) tablet 10 mg  10 mg Oral Daily Erminie Foulks, NP       aspirin EC tablet 81 mg  81 mg Oral QHS Eligha Bridegroom, NP   81 mg at 08/17/23 2106   atorvastatin (LIPITOR) tablet 80 mg  80 mg Oral QHS Eligha Bridegroom, NP   80 mg at 08/17/23 2106   clopidogrel (PLAVIX) tablet 75 mg  75 mg Oral Daily Eligha Bridegroom, NP   75 mg at 08/18/23 8657   docusate sodium (COLACE) capsule 100 mg  100 mg Oral BID Lauro Franklin, MD   100 mg at 08/18/23 1713   haloperidol (HALDOL) tablet 5 mg  5 mg Oral TID PRN  Eligha Bridegroom, NP       Or   haloperidol lactate (HALDOL) injection 5 mg  5 mg Intramuscular TID PRN Eligha Bridegroom, NP       insulin aspart (novoLOG) injection 0-9  Units  0-9 Units Subcutaneous TID WC Eligha Bridegroom, NP   1 Units at 08/18/23 1717   insulin aspart (novoLOG) injection 3 Units  3 Units Subcutaneous TID WC Starleen Blue, NP   3 Units at 08/18/23 1718   insulin glargine-yfgn (SEMGLEE) injection 15 Units  15 Units Subcutaneous Daily Gotham Raden, [name], NP   15 Units at 08/18/23 0921   latanoprost (XALATAN) 0.005 % ophthalmic solution 1 drop  1 drop Left Eye QHS Eligha Bridegroom, NP   1 drop at 08/17/23 2107   levothyroxine (SYNTHROID) tablet 50 mcg  50 mcg Oral QAC breakfast Eligha Bridegroom, NP   50 mcg at 08/18/23 1610   LORazepam (ATIVAN) tablet 2 mg  2 mg Oral TID PRN Eligha Bridegroom, NP       Or   LORazepam (ATIVAN) injection 2 mg  2 mg Intramuscular TID PRN Eligha Bridegroom, NP       pantoprazole (PROTONIX) EC tablet 40 mg  40 mg Oral QHS Eligha Bridegroom, NP   40 mg at 08/17/23 2106   pregabalin (LYRICA) capsule 75 mg  75 mg Oral BID Eligha Bridegroom, NP   75 mg at 08/18/23 1712   ranolazine (RANEXA) 12 hr tablet 500 mg  500 mg Oral BID Eligha Bridegroom, NP   500 mg at 08/18/23 1713   sertraline (ZOLOFT) tablet 150 mg  150 mg Oral QHS Starleen Blue, NP   150 mg at 08/17/23 2106   traZODone (DESYREL) tablet 50 mg  50 mg Oral QHS PRN Sindy Guadeloupe, NP   50 mg at 08/14/23 2121    Lab Results:  Results for orders placed or performed during the hospital encounter of 08/10/23 (from the past 48 hour(s))  Glucose, capillary     Status: Abnormal   Collection Time: 08/16/23  8:09 PM  Result Value Ref Range   Glucose-Capillary 214 (H) 70 - 99 mg/dL    Comment: Glucose reference range applies only to samples taken after fasting for at least 8 hours.  Glucose, capillary     Status: Abnormal   Collection Time: 08/17/23  6:28 AM  Result Value Ref Range   Glucose-Capillary 239  (H) 70 - 99 mg/dL    Comment: Glucose reference range applies only to samples taken after fasting for at least 8 hours.  Glucose, capillary     Status: Abnormal   Collection Time: 08/17/23 11:36 AM  Result Value Ref Range   Glucose-Capillary 315 (H) 70 - 99 mg/dL    Comment: Glucose reference range applies only to samples taken after fasting for at least 8 hours.   Comment 1 Notify RN   Glucose, capillary     Status: Abnormal   Collection Time: 08/17/23  5:08 PM  Result Value Ref Range   Glucose-Capillary 314 (H) 70 - 99 mg/dL    Comment: Glucose reference range applies only to samples taken after fasting for at least 8 hours.  Glucose, capillary     Status: Abnormal   Collection Time: 08/17/23  9:11 PM  Result Value Ref Range   Glucose-Capillary 193 (H) 70 - 99 mg/dL    Comment: Glucose reference range applies only to samples taken after fasting for at least 8 hours.  Glucose, capillary     Status: Abnormal   Collection Time: 08/18/23  6:06 AM  Result Value Ref Range   Glucose-Capillary 351 (H) 70 - 99 mg/dL    Comment: Glucose reference range applies only to samples taken after fasting for at least 8 hours.  Basic metabolic panel     Status: Abnormal   Collection Time: 08/18/23  6:33 AM  Result Value Ref Range   Sodium 135 135 - 145 mmol/L   Potassium 4.6 3.5 - 5.1 mmol/L   Chloride 100 98 - 111 mmol/L   CO2 26 22 - 32 mmol/L   Glucose, Bld 380 (H) 70 - 99 mg/dL    Comment: Glucose reference range applies only to samples taken after fasting for at least 8 hours.   BUN 33 (H) 6 - 20 mg/dL   Creatinine, Ser 1.61 (H) 0.44 - 1.00 mg/dL   Calcium 9.1 8.9 - 09.6 mg/dL   GFR, Estimated 33 (L) >60 mL/min    Comment: (NOTE) Calculated using the CKD-EPI Creatinine Equation (2021)    Anion gap 9 5 - 15    Comment: Performed at [facility], [address], [place], [place] [address]  Glucose, capillary     Status: Abnormal   Collection Time: 08/18/23 11:50 AM   Result Value Ref Range   Glucose-Capillary 208 (H) 70 - 99 mg/dL    Comment: Glucose reference range applies only to samples taken after fasting for at least 8 hours.    Blood Alcohol level:  Lab Results  Component Value Date   ETH <10 08/09/2023   ETH <10 07/29/2021    Metabolic Disorder Labs: Lab Results  Component Value Date   HGBA1C 6.9 (H) 06/29/2023   MPG 151.33 11/30/2020   MPG 174.29 10/05/2019   No results found for: "PROLACTIN" Lab Results  Component Value Date   CHOL 100 08/12/2023   TRIG 168 (H) 08/12/2023   HDL 33 (L) 08/12/2023   CHOLHDL 3.0 08/12/2023   VLDL 34 08/12/2023   LDLCALC 33 08/12/2023   LDLCALC 57 09/02/2021    Physical Findings: AIMS:  , ,  ,  ,    CIWA:    COWS:     Musculoskeletal: Strength & Muscle Tone: within normal limits Gait & Station: unsteady, blind Patient leans: N/A  Psychiatric Specialty Exam:  Presentation  General Appearance:  Casual; Fairly Groomed  Eye Contact: Other (comment) (blind)  Speech: Clear and Coherent  Speech Volume: Normal  Handedness: Right   Mood and Affect  Mood: Depressed; Anxious  Affect: Congruent   Thought Process  Thought Processes: Coherent  Descriptions of Associations:Intact  Orientation:Full (Time, Place and Person)  Thought Content:Logical  History of Schizophrenia/Schizoaffective disorder:Yes  Duration of Psychotic Symptoms:Less than six months  Hallucinations:Hallucinations: Visual; Tactile Description of Visual Hallucinations: bug crawling, on her  Ideas of Reference:None  Suicidal Thoughts:Suicidal Thoughts: No  Homicidal Thoughts:Homicidal Thoughts: No   Sensorium  Memory: Immediate Good  Judgment: Fair  Insight: Fair   Art therapist  Concentration: Fair  Attention Span: Fair  Recall: [employer] of Knowledge: Fair  Language: Fair   Psychomotor Activity  Psychomotor Activity: Psychomotor Activity: Normal   Assets   Assets: Resilience   Sleep  Sleep: Sleep: Fair    Physical Exam: Physical Exam Constitutional:      General: She is not in acute distress.    Appearance: Normal appearance. She is normal weight. She is not ill-appearing or toxic-appearing.  HENT:     Head: Normocephalic and atraumatic.  Pulmonary:     Effort: Pulmonary effort is normal.  Neurological:     General: No focal deficit present.     Mental Status: She is alert and oriented to person, place, and time. Mental status is at baseline.  Psychiatric:        Mood and Affect: Mood normal.        Behavior: Behavior normal.    Review of Systems  Constitutional: Negative.  Negative for fever.  Respiratory:  Negative for cough and shortness of breath.   Cardiovascular:  Negative for chest pain.  Gastrointestinal:  Negative for nausea and vomiting.  Skin: Negative.   Neurological:  Positive for dizziness. Negative for weakness and headaches.  Psychiatric/Behavioral:  Positive for depression and hallucinations. Negative for memory loss, substance abuse and suicidal ideas. The patient is nervous/anxious and has insomnia.    Blood pressure 119/65, pulse 72, temperature 98.5 F (36.9 C), temperature source Oral, resp. rate 16, height 5\' 2"  (1.575 m), weight 69.9 kg, SpO2 98%. Body mass index is 28.17 kg/m.  Treatment Plan Summary: Daily contact with patient to assess and evaluate symptoms and progress in treatment and Medication management  Safety and Monitoring: Voluntary admission to inpatient psychiatric unit for safety, stabilization and treatment Daily contact with patient to assess and evaluate symptoms and progress in treatment Patient's case to be discussed in multi-disciplinary team meeting Observation Level : 1:1 staff monitoring for safety Vital signs: q12 hours Precautions: Safety  Long Term Goal(s): Improvement in symptoms so as ready for discharge  Short Term Goals: Ability to identify changes in  lifestyle to reduce recurrence of condition will improve, Ability to verbalize feelings will improve, Ability to disclose and discuss suicidal ideas, Ability to demonstrate self-control will improve, Ability to identify and develop effective coping behaviors will improve, Ability to maintain clinical measurements within normal limits will improve, and Compliance with prescribed medications will improve  Diagnoses Principal Problem:   Schizoaffective disorder (HCC) Active Problems:   Dyslipidemia   Blindness and low vision   Diabetes (HCC)   Insomnia   GAD (generalized anxiety disorder)  Medications -Increase Abilify to 10 mg daily on 10/3 for psychosis -Continue Zoloft 150 mg for MDD & GAD -Discontinued Wellbutrin XL150 mg at bedtime due to lack of effectiveness (10/1) -Discontinued Haldol 10 mg BID due to lack of effectiveness (10/01) -Continue Agitation Protocol: Haldol/Ativan  Medications for medical reasons  Diabetes: -Semglee 15 units QAM as per diabetic consultant   -Novolog 3 units TID with meals if she consumes at least 50% -Continue NovoLog as per sliding scale-please see the MAR  HTN: On 08/13/2023, patient is noted to be hypotensive. -Continue Amlodipine 2.5 mg daily (hold until further notice starting 08/13/2023) -Continue Losartan 12.5 mg daily (hold until further notice starting on 08/13/2023) -Hold all antihypertensives as patient has continued to present with hypotension.   -Continue Aspirin EC 81 mg QHS -Continue Lipitor 80 mg QHS -Continue Plavix 75 mg daily -Continue Isordil 20 mg BID -Continue Xalatan 0.005% 1 drop Left eye QHS -Continue Synthroid 50 mcg daily -Continue Protonix EC 40 mg QHS -Continue Lyrica 75 mg BID -Continue Ranolazine 500 mg BID -Continue PRN's: Tylenol, Maalox, Atarax, Milk of Magnesia, Trazodone  Other PRNS -Continue Tylenol 650 mg every 6 hours PRN for mild pain -Continue Maalox 30 mg every 4 hrs PRN for indigestion -Continue Milk  of Magnesia as needed every 6 hrs for constipation  Labs reviewed: Repeat BMP on 10/2,  Discharge Planning: Social work and case management to assist with discharge planning and identification of hospital follow-up needs prior to discharge Estimated LOS: 5-7 days Discharge Concerns: Need to establish a safety plan; Medication compliance and effectiveness Discharge Goals: Return home with outpatient referrals for mental health follow-up including medication  management/psychotherapy  Starleen Blue, NP 08/18/2023, 5:26 PM Patient ID: Kadeidra D Lagerstrom, female   DOB: 15-Jul-1971, 52 y.o.   MRN: [id number]

## 2023-08-18 NOTE — [facility] Group Notes (Signed)
Spiritual care group facilitated by Chaplain Katy Denessa Cavan, [facility]  Group focused on topic of strength. Group members reflected on what thoughts and feelings emerge when they hear this topic. They then engaged in facilitated dialog around how strength is present in their lives. This dialog focused on representing what strength had been to them in their lives (images and patterns given) and what they saw as helpful in their life now (what they needed / wanted).  Activity drew on narrative framework.  Patient Progress: Did not attend.  

## 2023-08-18 NOTE — Plan of Care (Signed)
  Problem: Health Behavior/Discharge Planning: Goal: Compliance with treatment plan for underlying cause of condition will improve Outcome: Progressing   Problem: Safety: Goal: Periods of time without injury will increase Outcome: Progressing   Problem: Health Behavior/Discharge Planning: Goal: Compliance with treatment plan for underlying cause of condition will improve Outcome: Progressing   Problem: Safety: Goal: Periods of time without injury will increase Outcome: Progressing

## 2023-08-18 NOTE — Group Note (Signed)
Recreation Therapy Group Note   Group Topic:Other  Group Date: 08/18/2023 Start Time: 1405 End Time: 1450 Facilitators: Ayeisha Lindenberger-McCall, LRT,CTRS Location: [address] Dayroom   Activity Description/Intervention: Therapeutic Drumming. Patients with peers and staff were given the opportunity to engage in a leader facilitated [facility] Group Empowerment Drumming Circle with staff from the [employer], in partnership with [employer]. Teaching laboratory technician and trained [employer], Theodoro Doing leading with LRT observing and documenting intervention and pt response. This evidenced-based practice targets 7 areas of health and wellbeing in the human experience including: stress-reduction, exercise, self-expression, camaraderie/support, nurturing, spirituality, and music-making (leisure).   Goal Area(s) Addresses:  Patient will engage in pro-social way in music group.  Patient will follow directions of drum leader on the first prompt. Patient will demonstrate no behavioral issues during group.  Patient will identify if a reduction in stress level occurs as a result of participation in therapeutic drum circle.    Education: Leisure exposure, Pharmacologist, Musical expression, Discharge Planning   Affect/Mood: N/A   Participation Level: Did not attend    Clinical Observations/Individualized Feedback:     Plan: Continue to engage patient in RT group sessions 2-3x/week.   Aubry Rankin-McCall, LRT,CTRS 08/18/2023 3:50 PM

## 2023-08-18 NOTE — Progress Notes (Addendum)
Pt awake, visible at medication window on initial contact. Denies SI, HI, AH and pain when assessed. Continues to endorse +VH of bugs "I still see bugs and it worries me".  Rates her anxiety 3/10 and depression 4/10 with current stressor being +VH. Reports she slept well with good appetite. Ambulatory in milieu with gait belt. Compliant with current medication regimen, denies adverse drug reactions. 1:1 precaution maintained without falls or self harm gestures to note thus far. Assigned staff in attendance at all times. Tolerates meals and fluids well.

## 2023-08-19 ENCOUNTER — Inpatient Hospital Stay (HOSPITAL_COMMUNITY)
Admission: AD | Admit: 2023-08-19 | Discharge: 2023-08-19 | Disposition: A | Discharge status: Home / Self Care | Payer: Medicare HMO | Source: Intra-hospital | Attending: Nurse Practitioner | Admitting: Nurse Practitioner

## 2023-08-19 DIAGNOSIS — F251 Schizoaffective disorder, depressive type: Secondary | ICD-10-CM | POA: Diagnosis not present

## 2023-08-19 LAB — VITAMIN B12: Vitamin B-12: 249 pg/mL (ref 180–914)

## 2023-08-19 LAB — GLUCOSE, CAPILLARY
Glucose-Capillary: 206 mg/dL — ABNORMAL HIGH (ref 70–99)
Glucose-Capillary: 217 mg/dL — ABNORMAL HIGH (ref 70–99)
Glucose-Capillary: 141 mg/dL — ABNORMAL HIGH (ref 70–99)
Glucose-Capillary: 248 mg/dL — ABNORMAL HIGH (ref 70–99)

## 2023-08-19 LAB — RPR: RPR Ser Ql: NONREACTIVE

## 2023-08-19 LAB — HIV ANTIBODY (ROUTINE TESTING W REFLEX): HIV Screen 4th Generation wRfx: NONREACTIVE

## 2023-08-19 NOTE — Care Management Important Message (Signed)
Medicare IM printed and given to Marya Landry, LCSW to give to the patient.

## 2023-08-19 NOTE — Plan of Care (Signed)
  Problem: Health Behavior/Discharge Planning: Goal: Compliance with treatment plan for underlying cause of condition will improve Outcome: Progressing   Problem: Physical Regulation: Goal: Ability to maintain clinical measurements within normal limits will improve Outcome: Progressing   Problem: Safety: Goal: Periods of time without injury will increase Outcome: Progressing   

## 2023-08-19 NOTE — Progress Notes (Signed)
1:1 Nursing Note  Patient resting in bed with eyes closed-respirations even and unlabored. No distress noted. 1:1 sitter at bedside.

## 2023-08-19 NOTE — [facility] Group Notes (Signed)
Pt did not attend wrap-up group   

## 2023-08-19 NOTE — Plan of Care (Signed)
  Problem: Education: Goal: Knowledge of [facility] Education information/materials will improve Outcome: Progressing Goal: Emotional status will improve Outcome: Progressing Goal: Mental status will improve Outcome: Progressing Goal: Verbalization of understanding the information provided will improve Outcome: Progressing   

## 2023-08-19 NOTE — Inpatient Diabetes Management (Signed)
Inpatient Diabetes Program Recommendations  AACE/ADA: New Consensus Statement on Inpatient Glycemic Control (2015)  Target Ranges:  Prepandial:   less than 140 mg/dL      Peak postprandial:   less than 180 mg/dL (1-2 hours)      Critically ill patients:  140 - 180 mg/dL   Lab Results  Component Value Date   GLUCAP 217 (H) 08/19/2023   HGBA1C 6.9 (H) 06/29/2023    Review of Glycemic Control  Diabetes history: DM2   Outpatient Diabetes medications:  Ozempic 1 mg weekly Tresiba 15 units every day Novolog 15 units TID with meals   Current orders for Inpatient glycemic control:  Semglee 15 units every day Novolog 0-9 units TID Novolog 3 units TID  Inpatient Diabetes Program Recommendations:    Please consider increasing meal coverage:  Novolog 5 units TID with meals if he consumes at least 50%.  Will continue to follow while inpatient.  Thank you, Dulce Sellar, MSN, CDCES Diabetes Coordinator Inpatient Diabetes Program [phone] (team pager from 8a-5p)

## 2023-08-19 NOTE — Group Note (Signed)
LCSW Group Therapy Note   Group Date: 08/19/2023 Start Time: 1100 End Time: 1200   Type of Therapy and Topic:  Group Therapy: Boundaries  Participation Level:  Did Not Attend  Description of Group: This group will address the use of boundaries in their personal lives. Patients will explore why boundaries are important, the difference between healthy and unhealthy boundaries, and negative and postive outcomes of different boundaries and will look at how boundaries can be crossed.  Patients will be encouraged to identify current boundaries in their own lives and identify what kind of boundary is being set. Facilitators will guide patients in utilizing problem-solving interventions to address and correct types boundaries being used and to address when no boundary is being used. Understanding and applying boundaries will be explored and addressed for obtaining and maintaining a balanced life. Patients will be encouraged to explore ways to assertively make their boundaries and needs known to significant others in their lives, using other group members and facilitator for role play, support, and feedback.  Therapeutic Goals:  1.  Patient will identify areas in their life where setting clear boundaries could be  used to improve their life.  2.  Patient will identify signs/triggers that a boundary is not being respected. 3.  Patient will identify two ways to set boundaries in order to achieve balance in  their lives: 4.  Patient will demonstrate ability to communicate their needs and set boundaries  through discussion and/or role plays  Summary of Patient Progress:  No Show  Therapeutic Modalities:   Cognitive Behavioral Therapy Solution-Focused Therapy  Marinda Elk, LCSW 08/19/2023  4:10 PM

## 2023-08-19 NOTE — Progress Notes (Signed)
1:1 Nursing Note  Patient observed in room with MHT sitter at bedside. Patient remained in her room for most of the shift, despite having made the goal today "to socialize more." Patient remains safe with Q 15 min checks and 1:1 sitter.

## 2023-08-19 NOTE — Progress Notes (Signed)
[facility] MD Progress Note  08/19/2023 10:25 AM Kent D Troiano  MRN:  [id number] Subjective:   Deanna Landry is a 52 yr old female who presented on 9/23 to [facility] with complaints of SI due to worsening of her chronic VH, she was admitted to [facility] on 9/25. PPHx is significant for Schizoaffective Disorder Bipolar Type, Chronic Hallucinations, Anxiety, and PTSD, and 2 Suicide Attempts (last-OD on Insulin 07/2021) and 5 Prior Psychiatric Hospitalizations (last- [facility] 07/2021), and no history of Self Injurious Behavior.   24 hr chart review: Patient is compliant with her medications on the unit including psychotropic medications Abilify 10 mg daily, Zoloft 150 mg daily, as needed trazodone 50 mg used last night for sleep, no agitation protocol needed or given.  Staff report patient to be calm and cooperative with no issues noted.  Vitals were reviewed.  Patient assessment note: Patient remains on one-on-one staff monitoring for her safety, since she is blind.  Patient was evaluated in her room, she continues to report feeling depressed but it is mainly related to seeing bugs, she tells me seeing bugs started about a month ago with no improvement given trying different medication since admission.  She denies any history of previous hallucinations except that 3 years ago that led to her admission to [facility] and got stabilized with medications.  She denies feeling hopeless or helpless denies any passive or active SI intention or plan, denies HI or AVH.  She denies any paranoia or other delusions.  She presents linear and organized without any psychosis noted during evaluation.  She does report 2 previous psychiatric hospitalization 3 years ago and the first time at age 53 years old for increased depression.  She tells me she was diagnosed with diabetes at a very young age "[age]", she suffers from chronic kidney disease stage IV probably secondary to diabetic nephropathy.  She reports she has an appointment with  ophthalmology in November as well as follow-up appointment with her primary care provider in the next 2 weeks.  I discussed with patient that in my opinion she does not present psychotic or disorganized except for her reports of seeing bugs which can be related to diabetic retinopathy especially given the fact that she did not respond even partially to different antipsychotics tried during this hospitalization even at higher doses.  Discussed with patient the plan to do a head CT today to rule out any organic reason of her hallucinations but if she continues to do well I plan to discharge her tomorrow with outpatient follow-up with primary care provider and ophthalmology to rule out diabetic retinopathy as a cause of her visual hallucinations.  Principal Problem: Schizoaffective disorder (HCC) Diagnosis: Principal Problem:   Schizoaffective disorder (HCC) Active Problems:   Dyslipidemia   Blindness and low vision   Diabetes (HCC)   Insomnia   GAD (generalized anxiety disorder)  Total Time spent with patient:  I personally spent 35 minutes on the unit in direct patient care. The direct patient care time included face-to-face time with the patient, reviewing the patient's chart, communicating with other professionals, and coordinating care. Greater than 50% of this time was spent in counseling or coordinating care with the patient regarding goals of hospitalization, psycho-education, and discharge planning needs.   Past Psychiatric History: Schizoaffective Disorder Bipolar Type, Chronic Hallucinations, Anxiety, and PTSD, and 2 Suicide Attempts (last-OD on Insulin 07/2021) and 5 Prior Psychiatric Hospitalizations (last- [facility] 07/2021), and no history of Self Injurious Behavior.  Past Medical History:  Past Medical History:  Diagnosis Date   Acute kidney injury superimposed on CKD (HCC) 03/15/2019   Acute respiratory failure with hypoxia (HCC) 03/15/2019   Adjustment disorder with mixed anxiety  and depressed mood 03/11/2019   Anemia    Anxiety    Arthritis    right ankle   Blind    right eye   CHF (congestive heart failure) (HCC)    Closed right pilon fracture, initial encounter 07/26/2019   COPD (chronic obstructive pulmonary disease) (HCC)    Coronary artery disease    Depression    Diabetes mellitus type 2 in obese 07/26/2019   Diabetes mellitus without complication (HCC)    type 1   Diabetic polyneuropathy (HCC) 05/04/2016   Dyslipidemia 01/03/2020   Fever 03/10/2019   GERD (gastroesophageal reflux disease)    History of blood transfusion    HLD (hyperlipidemia)    Hyperglycemia    Hypertension    Hypothyroidism    Ileus, postoperative (HCC)    Major depression, recurrent (HCC) 03/09/2019   MDD (major depressive disorder), severe (HCC) 03/09/2019   Myocardial infarct, old    2020, s/p DES RCA, RPDA 10/23/19   Neck pain 04/21/2016   Pneumonia 10/2019   Renal disorder    stage 3   Sepsis (HCC) 03/10/2019   Short-term memory loss    mild   Sleep apnea    study done 9/27 and 08/13/20 - no results yet 08/21/20   Stroke (HCC)    Suicidal ideation 03/15/2019   Type 1 diabetes mellitus without complication (HCC) 03/15/2019   Wound infection after surgery (Right ankle) 10/04/2019    Past Surgical History:  Procedure Laterality Date   ABDOMINAL HYSTERECTOMY  2014   ANKLE FUSION Right 08/23/2020   Procedure: ANKLE FUSION;  Surgeon: Roby Lofts, MD;  Location: [facility] OR;  Service: Orthopedics;  Laterality: Right;   APPLICATION OF WOUND VAC Right 10/11/2019   Procedure: WOUND VAC CHANGE TO RIGHT LEG;  Surgeon: Roby Lofts, MD;  Location: [facility] OR;  Service: Orthopedics;  Laterality: Right;   CORONARY ANGIOPLASTY  10/23/2019   DES RCA, DES RPDA 10/23/19 [employer]   EYE SURGERY     HARDWARE REMOVAL Right 10/06/2019   Procedure: HARDWARE REMOVAL;  Surgeon: Roby Lofts, MD;  Location: [facility] OR;  Service: Orthopedics;  Laterality: Right;   HARDWARE REMOVAL Right 08/23/2020   Procedure:  HARDWARE REMOVAL;  Surgeon: Roby Lofts, MD;  Location: [facility] OR;  Service: Orthopedics;  Laterality: Right;   I & D EXTREMITY Right 10/06/2019   Procedure: IRRIGATION AND DEBRIDEMENT EXTREMITY;  Surgeon: Roby Lofts, MD;  Location: [facility] OR;  Service: Orthopedics;  Laterality: Right;   I & D EXTREMITY Right 10/09/2019   Procedure: IRRIGATION AND DEBRIDEMENT EXTREMITY and WOUND VAC CHANGE RIGHT ANKLE;  Surgeon: Roby Lofts, MD;  Location: [facility] OR;  Service: Orthopedics;  Laterality: Right;   ORIF ANKLE FRACTURE Right 07/26/2019   Procedure: OPEN REDUCTION INTERNAL FIXATION (ORIF) ANKLE FRACTURE;  Surgeon: Roby Lofts, MD;  Location: [facility] OR;  Service: Orthopedics;  Laterality: Right;  OPEN REDUCTION INTERNAL FIXATION (ORIF) ANKLE FRACTURE    TOE AMPUTATION     Family History:  Family History  Problem Relation Age of Onset   Diabetes Mellitus II Mother    Stroke Mother    Heart attack Mother    Stomach cancer Father    Congestive Heart Failure Father    Diabetes Sister    Congestive  Heart Failure Maternal Grandfather    Family Psychiatric  History:  Mother- EtOH Abuse Maternal Uncle- Suicide Attempt Cousin- Heroin Abuse  Social History:  Social History   Substance and Sexual Activity  Alcohol Use Not Currently     Social History   Substance and Sexual Activity  Drug Use Not Currently    Social History   Socioeconomic History   Marital status: Divorced    Spouse name: Not on file   Number of children: Not on file   Years of education: Not on file   Highest education level: Not on file  Occupational History   Not on file  Tobacco Use   Smoking status: Never   Smokeless tobacco: Never  Vaping Use   Vaping status: Never Used  Substance and Sexual Activity   Alcohol use: Not Currently   Drug use: Not Currently   Sexual activity: Not on file    Comment: Hysterectomy  Other Topics Concern   Not on file  Social History Narrative   Not on file   Social  Determinants of Health   Financial Resource Strain: Not on file  Food Insecurity: No Food Insecurity (08/10/2023)   Hunger Vital Sign    Worried About Running Out of Food in the Last Year: Never true    Ran Out of Food in the Last Year: Never true  Transportation Needs: No Transportation Needs (08/10/2023)   PRAPARE - Administrator, Civil Service (Medical): No    Lack of Transportation (Non-Medical): No  Physical Activity: Not on file  Stress: Not on file  Social Connections: Not on file   Additional Social History:                         Sleep: Good  Appetite:  Good  Current Medications: Current Facility-Administered Medications  Medication Dose Route Frequency Provider Last Rate Last Admin   acetaminophen (TYLENOL) tablet 650 mg  650 mg Oral Q6H PRN Eligha Bridegroom, NP       alum & mag hydroxide-simeth (MAALOX/MYLANTA) 200-200-20 MG/5ML suspension 30 mL  30 mL Oral Q4H PRN Eligha Bridegroom, NP       ARIPiprazole (ABILIFY) tablet 10 mg  10 mg Oral Daily Nkwenti, Doris, NP   10 mg at 08/19/23 0751   aspirin EC tablet 81 mg  81 mg Oral QHS Eligha Bridegroom, NP   81 mg at 08/18/23 2114   atorvastatin (LIPITOR) tablet 80 mg  80 mg Oral QHS Eligha Bridegroom, NP   80 mg at 08/18/23 2114   clopidogrel (PLAVIX) tablet 75 mg  75 mg Oral Daily Eligha Bridegroom, NP   75 mg at 08/19/23 0751   docusate sodium (COLACE) capsule 100 mg  100 mg Oral BID Lauro Franklin, MD   100 mg at 08/19/23 0751   haloperidol (HALDOL) tablet 5 mg  5 mg Oral TID PRN Eligha Bridegroom, NP       Or   haloperidol lactate (HALDOL) injection 5 mg  5 mg Intramuscular TID PRN Eligha Bridegroom, NP       insulin aspart (novoLOG) injection 0-9 Units  0-9 Units Subcutaneous TID WC Eligha Bridegroom, NP   3 Units at 08/19/23 0619   insulin aspart (novoLOG) injection 3 Units  3 Units Subcutaneous TID WC Starleen Blue, NP   3 Units at 08/19/23 0620   insulin glargine-yfgn (SEMGLEE) injection 15  Units  15 Units Subcutaneous Daily Starleen Blue, NP   15 Units  at 08/19/23 0752   latanoprost (XALATAN) 0.005 % ophthalmic solution 1 drop  1 drop Left Eye QHS Eligha Bridegroom, NP   1 drop at 08/18/23 2115   levothyroxine (SYNTHROID) tablet 50 mcg  50 mcg Oral QAC breakfast Eligha Bridegroom, NP   50 mcg at 08/19/23 9562   LORazepam (ATIVAN) tablet 2 mg  2 mg Oral TID PRN Eligha Bridegroom, NP       Or   LORazepam (ATIVAN) injection 2 mg  2 mg Intramuscular TID PRN Eligha Bridegroom, NP       pantoprazole (PROTONIX) EC tablet 40 mg  40 mg Oral QHS Eligha Bridegroom, NP   40 mg at 08/18/23 2114   pregabalin (LYRICA) capsule 75 mg  75 mg Oral BID Eligha Bridegroom, NP   75 mg at 08/19/23 0751   ranolazine (RANEXA) 12 hr tablet 500 mg  500 mg Oral BID Eligha Bridegroom, NP   500 mg at 08/19/23 0751   sertraline (ZOLOFT) tablet 150 mg  150 mg Oral QHS Starleen Blue, NP   150 mg at 08/18/23 2114   traZODone (DESYREL) tablet 50 mg  50 mg Oral QHS PRN Sindy Guadeloupe, NP   50 mg at 08/18/23 2114    Lab Results:  Results for orders placed or performed during the hospital encounter of 08/10/23 (from the past 48 hour(s))  Glucose, capillary     Status: Abnormal   Collection Time: 08/17/23 11:36 AM  Result Value Ref Range   Glucose-Capillary 315 (H) 70 - 99 mg/dL    Comment: Glucose reference range applies only to samples taken after fasting for at least 8 hours.   Comment 1 Notify RN   Glucose, capillary     Status: Abnormal   Collection Time: 08/17/23  5:08 PM  Result Value Ref Range   Glucose-Capillary 314 (H) 70 - 99 mg/dL    Comment: Glucose reference range applies only to samples taken after fasting for at least 8 hours.  Glucose, capillary     Status: Abnormal   Collection Time: 08/17/23  9:11 PM  Result Value Ref Range   Glucose-Capillary 193 (H) 70 - 99 mg/dL    Comment: Glucose reference range applies only to samples taken after fasting for at least 8 hours.  Glucose, capillary     Status:  Abnormal   Collection Time: 08/18/23  6:06 AM  Result Value Ref Range   Glucose-Capillary 351 (H) 70 - 99 mg/dL    Comment: Glucose reference range applies only to samples taken after fasting for at least 8 hours.  Basic metabolic panel     Status: Abnormal   Collection Time: 08/18/23  6:33 AM  Result Value Ref Range   Sodium 135 135 - 145 mmol/L   Potassium 4.6 3.5 - 5.1 mmol/L   Chloride 100 98 - 111 mmol/L   CO2 26 22 - 32 mmol/L   Glucose, Bld 380 (H) 70 - 99 mg/dL    Comment: Glucose reference range applies only to samples taken after fasting for at least 8 hours.   BUN 33 (H) 6 - 20 mg/dL   Creatinine, Ser 1.30 (H) 0.44 - 1.00 mg/dL   Calcium 9.1 8.9 - 86.5 mg/dL   GFR, Estimated 33 (L) >60 mL/min    Comment: (NOTE) Calculated using the CKD-EPI Creatinine Equation (2021)    Anion gap 9 5 - 15    Comment: Performed at [facility], [address] [place], [place] [address]  Glucose, capillary  Status: Abnormal   Collection Time: 08/18/23 11:50 AM  Result Value Ref Range   Glucose-Capillary 208 (H) 70 - 99 mg/dL    Comment: Glucose reference range applies only to samples taken after fasting for at least 8 hours.  Glucose, capillary     Status: Abnormal   Collection Time: 08/18/23  5:10 PM  Result Value Ref Range   Glucose-Capillary 130 (H) 70 - 99 mg/dL    Comment: Glucose reference range applies only to samples taken after fasting for at least 8 hours.  RPR     Status: None   Collection Time: 08/18/23  6:52 PM  Result Value Ref Range   RPR Ser Ql NON REACTIVE NON REACTIVE    Comment: Performed at [facility], [address] [place], [place] [address]  Glucose, capillary     Status: None   Collection Time: 08/18/23  9:09 PM  Result Value Ref Range   Glucose-Capillary 86 70 - 99 mg/dL    Comment: Glucose reference range applies only to samples taken after fasting for at least 8 hours.   Comment 1 Notify RN   Glucose, capillary     Status:  Abnormal   Collection Time: 08/19/23  6:01 AM  Result Value Ref Range   Glucose-Capillary 206 (H) 70 - 99 mg/dL    Comment: Glucose reference range applies only to samples taken after fasting for at least 8 hours.  Vitamin B12     Status: None   Collection Time: 08/19/23  6:40 AM  Result Value Ref Range   Vitamin B-12 249 180 - 914 pg/mL    Comment: (NOTE) This assay is not validated for testing neonatal or myeloproliferative syndrome specimens for Vitamin B12 levels. Performed at [facility], [address] [place], [place] [address]     Blood Alcohol level:  Lab Results  Component Value Date   [facility] <10 08/09/2023   ETH <10 07/29/2021    Metabolic Disorder Labs: Lab Results  Component Value Date   HGBA1C 6.9 (H) 06/29/2023   MPG 151.33 11/30/2020   MPG 174.29 10/05/2019   No results found for: "PROLACTIN" Lab Results  Component Value Date   CHOL 100 08/12/2023   TRIG 168 (H) 08/12/2023   HDL 33 (L) 08/12/2023   CHOLHDL 3.0 08/12/2023   VLDL 34 08/12/2023   LDLCALC 33 08/12/2023   LDLCALC 57 09/02/2021    Physical Findings: AIMS:  , ,  ,  ,    CIWA:    COWS:     Musculoskeletal: Strength & Muscle Tone: within normal limits Gait & Station: unsteady, blind Patient leans: N/A  Psychiatric Specialty Exam:  Presentation  General Appearance:  Casual; Fairly Groomed  Eye Contact: Other (comment) (blind)  Speech: Clear and Coherent  Speech Volume: Normal  Handedness: Right   Mood and Affect  Mood: depressed  Affect: restricted   Thought Process  Thought Processes: Coherent  Descriptions of Associations:Intact  Orientation:Full (Time, Place and Person)  Thought Content:Logical No paranoia or other delusions noted History of Schizophrenia/Schizoaffective disorder:Yes  Duration of Psychotic Symptoms:Less than six months  Hallucinations: denies related hallucinations but admits to visual hallucinations as noted  above Ideas of Reference:None  Suicidal Thoughts: Denies passive or active SI intention or plan  Homicidal Thoughts: Denies HI   Sensorium  Memory: Immediate Good  Judgment: Fair  Insight: Fair   Art therapist  Concentration: Fair  Attention Span: Fair  Recall: [employer] of Knowledge: Fair  Language: Fair   Psychomotor Activity  Psychomotor Activity: No data recorded   Assets  Assets: Resilience   Sleep  Sleep: No data recorded    Physical Exam: Physical Exam Vitals and nursing note reviewed.  Constitutional:      General: She is not in acute distress.    Appearance: Normal appearance. She is normal weight. She is not ill-appearing or toxic-appearing.  HENT:     Head: Normocephalic and atraumatic.  Pulmonary:     Effort: Pulmonary effort is normal.  Neurological:     General: No focal deficit present.     Mental Status: She is alert and oriented to person, place, and time. Mental status is at baseline.  Psychiatric:        Mood and Affect: Mood normal.        Behavior: Behavior normal.    Review of Systems  Constitutional: Negative.  Negative for fever.  Respiratory:  Negative for cough and shortness of breath.   Cardiovascular:  Negative for chest pain.  Gastrointestinal:  Negative for nausea and vomiting.  Skin: Negative.   Neurological:  Positive for dizziness. Negative for weakness and headaches.  Psychiatric/Behavioral:  Positive for hallucinations and memory loss.   All other systems reviewed and are negative.  Blood pressure (!) 101/56, pulse (!) 102, temperature 98.3 F (36.8 C), temperature source Oral, resp. rate 18, height 5\' 2"  (1.575 m), weight 69.9 kg, SpO2 99%. Body mass index is 28.17 kg/m.  Treatment Plan Summary: Daily contact with patient to assess and evaluate symptoms and progress in treatment and Medication management  Safety and Monitoring: Voluntary admission to inpatient psychiatric unit for  safety, stabilization and treatment Daily contact with patient to assess and evaluate symptoms and progress in treatment Patient's case to be discussed in multi-disciplinary team meeting Observation Level : 1:1 staff monitoring for safety Vital signs: q12 hours Precautions: Safety  Long Term Goal(s): Improvement in symptoms so as ready for discharge  Short Term Goals: Ability to identify changes in lifestyle to reduce recurrence of condition will improve, Ability to verbalize feelings will improve, Ability to disclose and discuss suicidal ideas, Ability to demonstrate self-control will improve, Ability to identify and develop effective coping behaviors will improve, Ability to maintain clinical measurements within normal limits will improve, and Compliance with prescribed medications will improve  Diagnoses Principal Problem:   Schizoaffective disorder (HCC) Active Problems:   Dyslipidemia   Blindness and low vision   Diabetes (HCC)   Insomnia   GAD (generalized anxiety disorder)  Medications -Continue Abilify 10 mg daily on 10/3 for psychosis -Continue Zoloft 150 mg for MDD & GAD -Discontinued Wellbutrin XL150 mg at bedtime due to lack of effectiveness (10/1) -Discontinued Haldol 10 mg BID due to lack of effectiveness (10/01) -Continue Agitation Protocol: Haldol/Ativan  Medications for medical reasons  Diabetes: -Semglee 15 units QAM as per diabetic consultant   -Novolog 3 units TID with meals if she consumes at least 50% -Continue NovoLog as per sliding scale-please see the MAR  HTN: On 08/13/2023, patient is noted to be hypotensive. -Continue Amlodipine 2.5 mg daily (hold until further notice starting 08/13/2023) -Continue Losartan 12.5 mg daily (hold until further notice starting on 08/13/2023) -Hold all antihypertensives as patient has continued to present with hypotension.   -Continue Aspirin EC 81 mg QHS -Continue Lipitor 80 mg QHS -Continue Plavix 75 mg daily -Continue  Isordil 20 mg BID -Continue Xalatan 0.005% 1 drop Left eye QHS -Continue Synthroid 50 mcg daily -Continue Protonix EC  40 mg QHS -Continue Lyrica 75 mg BID -Continue Ranolazine 500 mg BID -Continue PRN's: Tylenol, Maalox, Atarax, Milk of Magnesia, Trazodone  Other PRNS -Continue Tylenol 650 mg every 6 hours PRN for mild pain -Continue Maalox 30 mg every 4 hrs PRN for indigestion -Continue Milk of Magnesia as needed every 6 hrs for constipation  Labs reviewed: BMP 10/2 BUN/creatinine still remains elevated 33/1.84 but improved since last time and seems closer to baseline given history of CKD  Discharge Planning: Social work and case management to assist with discharge planning and identification of hospital follow-up needs prior to discharge Estimated LOS: 5-7 days Discharge Concerns: Need to establish a safety plan; Medication compliance and effectiveness Discharge Goals: Return home with outpatient referrals for mental health follow-up including medication management/psychotherapy  Thomos Domine Abbott Pao, MD 08/19/2023, 10:25 AM

## 2023-08-19 NOTE — Progress Notes (Signed)
1:1 Nursing Note: Patient is sleeping. No distress noted. Continues 1:1 for safety.

## 2023-08-19 NOTE — Progress Notes (Signed)
Nursing 1:1 note D:Pt observed sleeping in bed with eyes closed. RR even and unlabored. No distress noted. A: 1:1 observation continues for safety  R: Pt remains safe  

## 2023-08-19 NOTE — Progress Notes (Signed)
1:1 Nursing Note  D. Pt presents with a depressed affect/ depressed mood- rated her depression, hopelessness and anxiety an 8/7/8, respectively. Pt reported that she had trouble falling asleep last night, and reported some dizziness at the med window this am. Pt's stated goal today is "socializing" by "getting out of my room more." Pt currently denies SI/HI and AVH and agrees to contact staff before acting on any harmful thoughts.  A. Labs and vitals monitored. Pt given and educated on medications. Pt supported emotionally and encouraged to express concerns and ask questions.   R. Pt remains safe with 15 minute checks and 1:1 sitter. Will continue POC.

## 2023-08-19 NOTE — Progress Notes (Signed)
   08/19/23 2100  Psych Admission Type (Psych Patients Only)  Admission Status Voluntary  Psychosocial Assessment  Patient Complaints Anxiety;Depression  Eye Contact Brief  Facial Expression Anxious;Worried  Affect Appropriate to circumstance  Speech Logical/coherent  Interaction Minimal  Motor Activity Slow  Appearance/Hygiene Unremarkable  Behavior Characteristics Anxious  Mood Depressed;Anxious  Thought Process  Coherency WDL  Content WDL  Delusions None reported or observed  Perception Hallucinations  Hallucination Tactile;Visual (bugs)  Judgment Limited  Confusion None  Danger to Self  Current suicidal ideation? Denies  Agreement Not to Harm Self Yes  Description of Agreement verbal  Danger to Others  Danger to Others None reported or observed   Progress note   D: Pt seen in room with sitter. Pt denies SI, HI, AH. Endorses tactile and visual hallucinations of bugs crawling on her. Pt rates pain  0/10. Pt rates anxiety  6/10 and depression  7/10. Pt states she has been out of her room today. Depressed affect. Pt is concerned about being discharged because she is still experiencing hallucinations that bother her. Pt encouraged to speak to the doctor in the morning and advocate for herself. No other concerns noted at this time.  A: Pt provided support and encouragement. Pt given scheduled medication as prescribed. PRNs as appropriate. Q15 min checks for safety.   R: Pt safe on the unit. Will continue to monitor.

## 2023-08-19 NOTE — Progress Notes (Signed)
   08/19/23 1100  Psych Admission Type (Psych Patients Only)  Admission Status Voluntary  Psychosocial Assessment  Patient Complaints Anxiety;Depression  Eye Contact Fair  Facial Expression Anxious;Sad  Affect Appropriate to circumstance  Speech Logical/coherent  Interaction Assertive  Motor Activity Slow  Appearance/Hygiene Unremarkable  Behavior Characteristics Appropriate to situation;Calm  Mood Depressed;Pleasant  Aggressive Behavior  Effect No apparent injury  Thought Process  Coherency WDL  Content WDL  Delusions None reported or observed  Perception Hallucinations  Hallucination None reported or observed  Judgment Limited  Confusion None  Danger to Self  Current suicidal ideation? Denies  Agreement Not to Harm Self Yes  Description of Agreement agreed to contact staff before acting on harmful thoughts  Danger to Others  Danger to Others None reported or observed

## 2023-08-19 NOTE — Progress Notes (Signed)
1:1 Nursing Note: Patient resting in bed. Took bedtime medications. Had snack. No distress noted. Continues 1:1 for safety.

## 2023-08-20 ENCOUNTER — Encounter (HOSPITAL_COMMUNITY): Payer: Self-pay

## 2023-08-20 DIAGNOSIS — F259 Schizoaffective disorder, unspecified: Secondary | ICD-10-CM | POA: Diagnosis not present

## 2023-08-20 LAB — SEDIMENTATION RATE: Sed Rate: 7 mm/h (ref 0–22)

## 2023-08-20 LAB — ANA W/REFLEX IF POSITIVE: Anti Nuclear Antibody (ANA): NEGATIVE

## 2023-08-20 LAB — GLUCOSE, CAPILLARY
Glucose-Capillary: 262 mg/dL — ABNORMAL HIGH (ref 70–99)
Glucose-Capillary: 131 mg/dL — ABNORMAL HIGH (ref 70–99)

## 2023-08-20 MED ORDER — ARIPIPRAZOLE 10 MG PO TABS: 10.0000 mg | ORAL_TABLET | Freq: Every day | ORAL | 0 refills | Status: AC

## 2023-08-20 MED ORDER — RANOLAZINE ER 500 MG PO TB12: 500.0000 mg | ORAL_TABLET | Freq: Two times a day (BID) | ORAL | 0 refills | Status: DC

## 2023-08-20 MED ORDER — INSULIN ASPART 100 UNIT/ML IJ SOLN: 3.0000 [IU] | Freq: Three times a day (TID) | INTRAMUSCULAR | 0 refills | Status: DC

## 2023-08-20 MED ORDER — INSULIN GLARGINE-YFGN 100 UNIT/ML ~~LOC~~ SOLN: 15.0000 [IU] | Freq: Every day | SUBCUTANEOUS | 0 refills | Status: DC

## 2023-08-20 MED ORDER — DOCUSATE SODIUM 100 MG PO CAPS: 100.0000 mg | ORAL_CAPSULE | Freq: Two times a day (BID) | ORAL | 0 refills | Status: AC

## 2023-08-20 MED ORDER — TRAZODONE HCL 50 MG PO TABS: 50.0000 mg | ORAL_TABLET | Freq: Every evening | ORAL | 0 refills | Status: DC | PRN

## 2023-08-20 MED ORDER — PANTOPRAZOLE SODIUM 40 MG PO TBEC: 40.0000 mg | DELAYED_RELEASE_TABLET | Freq: Every day | ORAL | 0 refills | Status: AC

## 2023-08-20 MED ORDER — LEVOTHYROXINE SODIUM 50 MCG PO TABS: 50.0000 ug | ORAL_TABLET | Freq: Every day | ORAL | 0 refills | Status: AC

## 2023-08-20 MED ORDER — SERTRALINE HCL 50 MG PO TABS: 150.0000 mg | ORAL_TABLET | Freq: Every day | ORAL | 0 refills | Status: AC

## 2023-08-20 MED ORDER — PREGABALIN 75 MG PO CAPS: 75.0000 mg | ORAL_CAPSULE | Freq: Two times a day (BID) | ORAL | 0 refills | Status: DC

## 2023-08-20 MED ORDER — ASPIRIN 81 MG PO TBEC: 81.0000 mg | DELAYED_RELEASE_TABLET | Freq: Every day | ORAL | 0 refills | Status: AC

## 2023-08-20 MED ORDER — ATORVASTATIN CALCIUM 80 MG PO TABS: 80.0000 mg | ORAL_TABLET | Freq: Every day | ORAL | 0 refills | Status: AC

## 2023-08-20 MED ORDER — LATANOPROST 0.005 % OP SOLN: 1.0000 [drp] | Freq: Every day | OPHTHALMIC | 0 refills | Status: AC

## 2023-08-20 NOTE — Group Note (Signed)
Recreation Therapy Group Note   Group Topic:Problem Solving  Group Date: 08/20/2023 Start Time: 0930 End Time: 0950 Facilitators: Turhan Chill-McCall, LRT,CTRS Location: [address] Dayroom   Group Topic: Communication, Team Building, Problem Solving  Goal Area(s) Addresses:  Patient will effectively work with peer towards shared goal.  Patient will identify skills used to make activity successful.  Patient will identify how skills used during activity can be applied to reach post d/c goals.   Intervention: STEM Activity- Glass blower/designer  Group Description: Tallest Pharmacist, community. In teams of 5-6, patients were given 11 craft pipe cleaners. Using the materials provided, patients were instructed to compete again the opposing team(s) to build the tallest free-standing structure from floor level. The activity was timed; difficulty increased by Clinical research associate as Production designer, theatre/television/film continued.  Systematically resources were removed with additional directions for example, placing one arm behind their back, working in silence, and shape stipulations. LRT facilitated post-activity discussion reviewing team processes and necessary communication skills involved in completion. Patients were encouraged to reflect how the skills utilized, or not utilized, in this activity can be incorporated to positively impact support systems post discharge.  Education: Pharmacist, community, Scientist, physiological, Discharge Planning   Education Outcome: Acknowledges education/In group clarification offered/Needs additional education.    Affect/Mood: N/A   Participation Level: Did not attend    Clinical Observations/Individualized Feedback:     Plan: Continue to engage patient in RT group sessions 2-3x/week.   Jakeria Caissie-McCall, LRT,CTRS  08/20/2023 11:35 AM

## 2023-08-20 NOTE — Discharge Summary (Signed)
Physician Discharge Summary Note  Patient:  Deanna Landry is an 52 y.o., female MRN:  [id number] DOB:  1971-02-14 Patient phone:  [phone] (home)  Patient address:   [address] [place] [address],  Total Time spent with patient: 45 minutes  Date of Admission:  08/10/2023 Date of Discharge: 08/20/2023  Reason for Admission:  Deanna Landry is a 52 yr old female who presented on 9/23 to [facility] with complaints of SI due to worsening of her chronic VH, she was admitted to [facility] on 9/25. PPHx is significant for Schizoaffective Disorder Bipolar Type, Chronic Hallucinations, Anxiety, and PTSD, and 2 Suicide Attempts (last-OD on Insulin 07/2021) and 5 Prior Psychiatric Hospitalizations (last- [facility] 07/2021), and no history of Self Injurious Behavior.   Principal Problem: Schizoaffective disorder [facility]) Discharge Diagnoses: Principal Problem:   Schizoaffective disorder (HCC) Active Problems:   Dyslipidemia   Blindness and low vision   Diabetes (HCC)   Insomnia   GAD (generalized anxiety disorder)  Past Psychiatric History: See H & P  Past Medical History:  Past Medical History:  Diagnosis Date   Acute kidney injury superimposed on CKD (HCC) 03/15/2019   Acute respiratory failure with hypoxia (HCC) 03/15/2019   Adjustment disorder with mixed anxiety and depressed mood 03/11/2019   Anemia    Anxiety    Arthritis    right ankle   Blind    right eye   CHF (congestive heart failure) (HCC)    Closed right pilon fracture, initial encounter 07/26/2019   COPD (chronic obstructive pulmonary disease) (HCC)    Coronary artery disease    Depression    Diabetes mellitus type 2 in obese 07/26/2019   Diabetes mellitus without complication (HCC)    type 1   Diabetic polyneuropathy (HCC) 05/04/2016   Dyslipidemia 01/03/2020   Fever 03/10/2019   GERD (gastroesophageal reflux disease)    History of blood transfusion    HLD (hyperlipidemia)    Hyperglycemia    Hypertension     Hypothyroidism    Ileus, postoperative (HCC)    Major depression, recurrent (HCC) 03/09/2019   MDD (major depressive disorder), severe (HCC) 03/09/2019   Myocardial infarct, old    2020, s/p DES RCA, RPDA 10/23/19   Neck pain 04/21/2016   Pneumonia 10/2019   Renal disorder    stage 3   Sepsis (HCC) 03/10/2019   Short-term memory loss    mild   Sleep apnea    study done 9/27 and 08/13/20 - no results yet 08/21/20   Stroke (HCC)    Suicidal ideation 03/15/2019   Type 1 diabetes mellitus without complication (HCC) 03/15/2019   Wound infection after surgery (Right ankle) 10/04/2019    Past Surgical History:  Procedure Laterality Date   ABDOMINAL HYSTERECTOMY  2014   ANKLE FUSION Right 08/23/2020   Procedure: ANKLE FUSION;  Surgeon: Roby Lofts, MD;  Location: [facility] OR;  Service: Orthopedics;  Laterality: Right;   APPLICATION OF WOUND VAC Right 10/11/2019   Procedure: WOUND VAC CHANGE TO RIGHT LEG;  Surgeon: Roby Lofts, MD;  Location: [facility] OR;  Service: Orthopedics;  Laterality: Right;   CORONARY ANGIOPLASTY  10/23/2019   DES RCA, DES RPDA 10/23/19 [facility])   EYE SURGERY     HARDWARE REMOVAL Right 10/06/2019   Procedure: HARDWARE REMOVAL;  Surgeon: Roby Lofts, MD;  Location: [facility] OR;  Service: Orthopedics;  Laterality: Right;   HARDWARE REMOVAL Right 08/23/2020   Procedure: HARDWARE REMOVAL;  Surgeon: Roby Lofts,  MD;  Location: [facility] OR;  Service: Orthopedics;  Laterality: Right;   I & D EXTREMITY Right 10/06/2019   Procedure: IRRIGATION AND DEBRIDEMENT EXTREMITY;  Surgeon: Roby Lofts, MD;  Location: [facility] OR;  Service: Orthopedics;  Laterality: Right;   I & D EXTREMITY Right 10/09/2019   Procedure: IRRIGATION AND DEBRIDEMENT EXTREMITY and WOUND VAC CHANGE RIGHT ANKLE;  Surgeon: Roby Lofts, MD;  Location: [facility] OR;  Service: Orthopedics;  Laterality: Right;   ORIF ANKLE FRACTURE Right 07/26/2019   Procedure: OPEN REDUCTION INTERNAL FIXATION (ORIF) ANKLE FRACTURE;  Surgeon: Roby Lofts, MD;  Location: [facility] OR;  Service: Orthopedics;  Laterality: Right;  OPEN REDUCTION INTERNAL FIXATION (ORIF) ANKLE FRACTURE    TOE AMPUTATION     Family History:  Family History  Problem Relation Age of Onset   Diabetes Mellitus II Mother    Stroke Mother    Heart attack Mother    Stomach cancer Father    Congestive Heart Failure Father    Diabetes Sister    Congestive Heart Failure Maternal Grandfather    Family Psychiatric  History: See H & P Social History:  Social History   Substance and Sexual Activity  Alcohol Use Not Currently     Social History   Substance and Sexual Activity  Drug Use Not Currently    Social History   Socioeconomic History   Marital status: Divorced    Spouse name: Not on file   Number of children: Not on file   Years of education: Not on file   Highest education level: Not on file  Occupational History   Not on file  Tobacco Use   Smoking status: Never   Smokeless tobacco: Never  Vaping Use   Vaping status: Never Used  Substance and Sexual Activity   Alcohol use: Not Currently   Drug use: Not Currently   Sexual activity: Not on file    Comment: Hysterectomy  Other Topics Concern   Not on file  Social History Narrative   Not on file   Social Determinants of Health   Financial Resource Strain: Not on file  Food Insecurity: No Food Insecurity (08/10/2023)   Hunger Vital Sign    Worried About Running Out of Food in the Last Year: Never true    Ran Out of Food in the Last Year: Never true  Transportation Needs: No Transportation Needs (08/10/2023)   PRAPARE - Administrator, Civil Service (Medical): No    Lack of Transportation (Non-Medical): No  Physical Activity: Not on file  Stress: Not on file  Social Connections: Not on file   Hospital Course:   During the patient's hospitalization, patient had extensive initial psychiatric evaluation, and follow-up psychiatric evaluations every day.   Psychiatric diagnoses  provided upon initial assessment: As above.   Patient's psychiatric medications were adjusted on admission: As follows: -Increase Haldol to 7.5 mg BID for psychosis and mood stability. -Continue Agitation Protocol: Haldol/Ativan -Continue Wellbutrin XL 300 mg QHS for depression and anxiety -Continue Zoloft 100 mg QHS for depression and anxiety   During the hospitalization, other adjustments were made to the patient's psychiatric medication regimen. Wellbutrin was discontinued and Haldol was discontinued as patient reported lack of effectiveness.    Medications at discharge are as follows: -Continue Abilify 10 mg daily on 10/3 for psychosis -Continue Zoloft 150 mg for MDD & GAD -Continue Trazodone 50 mg nightly PRN for sleep   Continue Medications for medical reasons, and f/u with  PCP  -Continue Semglee 15 units QAM as per diabetic consultant   -Continue Novolog 3 units TID with meals -Held Amlodipine 2.5 mg daily & also held Losartan 12.5 mg daily starting 08/13/2023 due to hypotension. Please consult with your PCP or Cardiologist prior to resuming this medications. -Continue Aspirin EC 81 mg QHS -Continue Lipitor 80 mg QHS -Continue Plavix 75 mg daily -Continue Isordil 20 mg BID -Continue Xalatan 0.005% 1 drop Left eye QHS -Continue Synthroid 50 mcg daily -Continue Protonix EC 40 mg QHS -Continue Lyrica 75 mg BID -Continue Ranolazine 500 mg BID   Patient's care was discussed during the interdisciplinary team meeting every day during the hospitalization. The patient denies having side effects to prescribed psychiatric medication. Gradually, patient started adjusting to milieu. The patient was evaluated each day by a clinical provider to ascertain response to treatment. Improvement was noted by the patient's report of decreasing symptoms, improved sleep and appetite, affect, medication tolerance, behavior, and participation in unit programming.  Patient was asked each day to complete a  self inventory noting mood, mental status, pain, new symptoms, anxiety and concerns.     Symptoms were reported as significantly decreased or resolved completely by discharge. On day of discharge, the patient reports that their mood is stable. The patient denied having suicidal thoughts for more than 48 hours prior to discharge.  Patient denies having homicidal thoughts.  Patient denies having auditory hallucinations.  Patient continues to report visual hallucinations that have not improved while on antipsychotic medications, and which have remained unchanged over the course of this hospitalization. First time psychosis labs have been completed including CT scan of head, and all studies so far, are negative. At this time, patient's symptoms are thought to be most likely related to diabetic retinopathy, and she has been educated to follow this up with her Ophthalmologic provider and endocrinologist. The patient was motivated to continue taking medication with a goal of continued improvement in mental health.    The patient reports their target psychiatric symptoms of depression, anxiety and insomnia responded well to the psychiatric medications, and the patient reports overall benefit other psychiatric hospitalization. Supportive psychotherapy was provided to the patient. The patient also participated in regular group therapy while hospitalized. Coping skills, problem solving as well as relaxation therapies were also part of the unit programming.   Labs were reviewed with the patient, and abnormal results were discussed with the patient.   The patient is able to verbalize their individual safety plan to this provider.   # It is recommended to the patient to continue psychiatric medications as prescribed, after discharge from the hospital.     # It is recommended to the patient to follow up with your outpatient psychiatric provider and PCP.   # It was discussed with the patient, the impact of alcohol,  drugs, tobacco have been there overall psychiatric and medical wellbeing, and total abstinence from substance use was recommended the patient.ed.   # Prescriptions provided or sent directly to preferred pharmacy at discharge. Patient agreeable to plan. Given opportunity to ask questions. Appears to feel comfortable with discharge.    # In the event of worsening symptoms, the patient is instructed to call the crisis hotline (988), 911 and or go to the nearest ED for appropriate evaluation and treatment of symptoms. To follow-up with primary care provider for other medical issues, concerns and or health care needs   # Patient was discharged home with a plan to follow up as noted below.  Total Time spent with patient: 45 minutes Physical Findings: AIMS:0 CIWA: n/a    COWS:  n/a   Musculoskeletal: Strength & Muscle Tone: decreased Gait & Station: unsteady Patient leans: N/A  Psychiatric Specialty Exam:  Presentation  General Appearance:  Casual; Fairly Groomed  Eye Contact: Fair (blind)  Speech: Clear and Coherent  Speech Volume: Normal  Handedness: Right   Mood and Affect  Mood: Euthymic  Affect: Congruent; Appropriate   Thought Process  Thought Processes: Coherent  Descriptions of Associations:Intact  Orientation:Full (Time, Place and Person)  Thought Content:Logical  History of Schizophrenia/Schizoaffective disorder:Yes  Duration of Psychotic Symptoms:Greater than six months  Hallucinations:Hallucinations: Visual  Ideas of Reference:None  Suicidal Thoughts:Suicidal Thoughts: No  Homicidal Thoughts:Homicidal Thoughts: No   Sensorium  Memory: Immediate Good  Judgment: Good  Insight: Good   Executive Functions  Concentration: Good  Attention Span: Fair  Recall: Good  Fund of Knowledge: Good  Language: Good   Psychomotor Activity  Psychomotor Activity: Psychomotor Activity: Normal   Assets  Assets: Communication  Skills; Housing   Sleep  Sleep: Sleep: Good    Physical Exam: Physical Exam Review of Systems  Psychiatric/Behavioral:  Positive for depression (denies SI/HI/AH) and hallucinations (VH which seem to be related to diabetic retinopathy-Educated on the need to follow up medically). Negative for memory loss, substance abuse and suicidal ideas. The patient is nervous/anxious (Resolving) and has insomnia (Resolving).    Blood pressure (!) 96/58, pulse 76, temperature 98.9 F (37.2 C), temperature source Oral, resp. rate 18, height 5\' 2"  (1.575 m), weight 69.9 kg, SpO2 94%. Body mass index is 28.17 kg/m.  Social History   Tobacco Use  Smoking Status Never  Smokeless Tobacco Never   Tobacco Cessation:  N/A, patient does not currently use tobacco products  Blood Alcohol level:  Lab Results  Component Value Date   ETH <10 08/09/2023   ETH <10 07/29/2021    Metabolic Disorder Labs:  Lab Results  Component Value Date   HGBA1C 6.9 (H) 06/29/2023   MPG 151.33 11/30/2020   MPG 174.29 10/05/2019   No results found for: "PROLACTIN" Lab Results  Component Value Date   CHOL 100 08/12/2023   TRIG 168 (H) 08/12/2023   HDL 33 (L) 08/12/2023   CHOLHDL 3.0 08/12/2023   VLDL 34 08/12/2023   LDLCALC 33 08/12/2023   LDLCALC 57 09/02/2021    See Psychiatric Specialty Exam and Suicide Risk Assessment completed by Attending Physician prior to discharge.  Discharge destination:  Home  Is patient on multiple antipsychotic therapies at discharge:  No   Has Patient had three or more failed trials of antipsychotic monotherapy by history:  No  Recommended Plan for Multiple Antipsychotic Therapies: NA   Allergies as of 08/20/2023   No Known Allergies      Medication List     STOP taking these medications    Accu-Chek Guide test strip Generic drug: glucose blood   acetaminophen 325 MG tablet Commonly known as: TYLENOL   ALPRAZolam 0.25 MG tablet Commonly known as: XANAX    buPROPion 300 MG 24 hr tablet Commonly known as: WELLBUTRIN XL   doxycycline 100 MG capsule Commonly known as: VIBRAMYCIN   Easy Comfort Pen Needles 31G X 8 MM Misc Generic drug: Insulin Pen Needle   Gvoke HypoPen 1-Pack 1 MG/0.2ML Soaj Generic drug: Glucagon   Gvoke HypoPen 2-Pack 1 MG/0.2ML Soaj Generic drug: Glucagon   haloperidol 5 MG tablet Commonly known as: HALDOL   HYDROcodone-acetaminophen 5-325 MG tablet Commonly  known as: NORCO/VICODIN   Lokelma 10 g Pack packet Generic drug: sodium zirconium cyclosilicate   NovoLOG FlexPen 100 UNIT/ML FlexPen Generic drug: insulin aspart Replaced by: insulin aspart 100 UNIT/ML injection   Ozempic (1 MG/DOSE) 4 MG/3ML Sopn Generic drug: Semaglutide (1 MG/DOSE)   senna-docusate 8.6-50 MG tablet Commonly known as: Senokot-S   Systane 0.4-0.3 % Gel ophthalmic gel Generic drug: Polyethyl Glycol-Propyl Glycol   Tresiba FlexTouch 100 UNIT/ML FlexTouch Pen Generic drug: insulin degludec   Veltassa 8.4 g packet Generic drug: patiromer   Vyzulta 0.024 % Soln Generic drug: Latanoprostene Bunod       TAKE these medications      Indication  albuterol 108 (90 Base) MCG/ACT inhaler Commonly known as: VENTOLIN HFA Inhale 1-2 puffs into the lungs every 6 (six) hours as needed for wheezing or shortness of breath.  Indication: Chronic Obstructive Lung Disease   amLODipine 2.5 MG tablet Commonly known as: NORVASC Take 2.5 mg by mouth daily.  Indication: High Blood Pressure   ARIPiprazole 10 MG tablet Commonly known as: ABILIFY Take 1 tablet (10 mg total) by mouth daily. Start taking on: August 21, 2023  Indication: psychosis   aspirin EC 81 MG tablet Take 1 tablet (81 mg total) by mouth at bedtime. Swallow whole. What changed:  when to take this additional instructions  Indication: Joint Damage causing Pain and Loss of Function   atorvastatin 80 MG tablet Commonly known as: LIPITOR Take 1 tablet (80 mg total) by  mouth at bedtime. What changed: when to take this  Indication: High Amount of Fats in the Blood   clopidogrel 75 MG tablet Commonly known as: PLAVIX Take 75 mg by mouth at bedtime.  Indication: Acute Coronary Syndrome   docusate sodium 100 MG capsule Commonly known as: COLACE Take 1 capsule (100 mg total) by mouth 2 (two) times daily.  Indication: Constipation   insulin aspart 100 UNIT/ML injection Commonly known as: novoLOG Inject 3 Units into the skin 3 (three) times daily with meals. Replaces: NovoLOG FlexPen 100 UNIT/ML FlexPen  Indication: Type 2 Diabetes   insulin glargine-yfgn 100 UNIT/ML injection Commonly known as: SEMGLEE Inject 0.15 mLs (15 Units total) into the skin daily. Start taking on: August 21, 2023  Indication: Type 2 Diabetes   latanoprost 0.005 % ophthalmic solution Commonly known as: XALATAN Place 1 drop into the left eye at bedtime.  Indication: Glaucoma   levothyroxine 50 MCG tablet Commonly known as: SYNTHROID Take 1 tablet (50 mcg total) by mouth daily before breakfast.  Indication: Underactive Thyroid   loratadine 10 MG tablet Commonly known as: CLARITIN Take 10 mg by mouth daily as needed for allergies.  Indication: Perennial Allergic Rhinitis   losartan 25 MG tablet Commonly known as: COZAAR Take 25 mg by mouth daily.  Indication: High Blood Pressure   pantoprazole 40 MG tablet Commonly known as: PROTONIX Take 1 tablet (40 mg total) by mouth at bedtime. What changed: when to take this  Indication: Gastroesophageal Reflux Disease   pregabalin 75 MG capsule Commonly known as: LYRICA Take 1 capsule (75 mg total) by mouth 2 (two) times daily.  Indication: Neuropathic Pain   ranolazine 500 MG 12 hr tablet Commonly known as: RANEXA Take 1 tablet (500 mg total) by mouth 2 (two) times daily.  Indication: Stable Angina Pectoris   sertraline 50 MG tablet Commonly known as: ZOLOFT Take 3 tablets (150 mg total) by mouth at bedtime.   Indication: Generalized Anxiety Disorder, Major Depressive Disorder   traZODone  50 MG tablet Commonly known as: DESYREL Take 1 tablet (50 mg total) by mouth at bedtime as needed for sleep.  Indication: Trouble Sleeping       Comments: Transportation home provided by the [facility] health preferred transportation service "safe transport" for her safety rather than in a cab, due to patient being visually impaired-patient reports that she resides with her mother.  Signed: Starleen Blue, NP 08/20/2023, 5:14 PM

## 2023-08-20 NOTE — Progress Notes (Signed)
Nursing 1:1 note D:Pt observed sitting on side of bed. RR even and unlabored. No distress noted. A: 1:1 observation continues for safety  R: Pt remains safe

## 2023-08-20 NOTE — Progress Notes (Signed)
   08/20/23 0558  15 Minute Checks  Location Bedroom  Visual Appearance Calm  Behavior Sleeping  Sleep (Behavioral Health Patients Only)  Calculate sleep? (Click Yes once per 24 hr at 0600 safety check) Yes  Documented sleep last 24 hours 7.5

## 2023-08-20 NOTE — Progress Notes (Signed)
  [facility] Adult Case Management Discharge Plan :  Will you be returning to the same living situation after discharge:  Yes,   Ruby (mom) [phone]  At discharge, do you have transportation home?: Yes,   Ruby (mom) [phone]  Do you have the ability to pay for your medications: Yes,  [employer]  Release of information consent forms completed and in the chart;  Patient's signature needed at discharge.  Patient to Follow up at:  Follow-up Information     [facility] [employer]. Go to.   Why: Please go to this provider for group therapy and for medication management services.  Provider is open 24/7. Contact information: [address] [place] [address] [phone]                 Next level of care provider has access to [facility] Link:no  Safety Planning and Suicide Prevention discussed: Yes,   Mora Bellman (mom) [phone]      Has patient been referred to the Quitline?: Patient does not use tobacco/nicotine products  Patient has been referred for addiction treatment: Yes, referral information given but appointment not made [facility] (list facility). Patient to continue working towards treatment goals after discharge. Patient no longer meets criteria for inpatient criteria per attending physician. Continue taking medications as prescribed, nursing to provide instructions at discharge. Follow up with all scheduled appointments.   Taron Mondor S Zoya Sprecher, LCSW 08/20/2023, 9:20 AM

## 2023-08-20 NOTE — Plan of Care (Signed)
  Problem: Education: Goal: Emotional status will improve Outcome: Progressing Goal: Mental status will improve Outcome: Progressing Goal: Verbalization of understanding the information provided will improve Outcome: Progressing   Problem: Activity: Goal: Sleeping patterns will improve Outcome: Progressing   Problem: Coping: Goal: Ability to verbalize frustrations and anger appropriately will improve Outcome: Progressing Goal: Ability to demonstrate self-control will improve Outcome: Progressing

## 2023-08-20 NOTE — Progress Notes (Signed)
Pt discharged from facility. Pt left via safe transport. Pt removed all belongings and verbalized understanding of medications and discharge instructions. Pt denies SI/HI/AVH.

## 2023-08-20 NOTE — [facility] Suicide Risk Assessment (Signed)
Suicide Risk Assessment  Discharge Assessment    [facility] Discharge Suicide Risk Assessment   Principal Problem: Schizoaffective disorder [facility]) Discharge Diagnoses: Principal Problem:   Schizoaffective disorder (HCC) Active Problems:   Dyslipidemia   Blindness and low vision   Diabetes (HCC)   Insomnia   GAD (generalized anxiety disorder)  HPI: Deanna Landry is a 52 yr old female who presented on 9/23 to [facility] with complaints of SI due to worsening of her chronic VH, she was admitted to [facility] on 9/25. PPHx is significant for Schizoaffective Disorder Bipolar Type, Chronic Hallucinations, Anxiety, and PTSD, and 2 Suicide Attempts (last-OD on Insulin 07/2021) and 5 Prior Psychiatric Hospitalizations (last- [facility] 07/2021), and no history of Self Injurious Behavior.   During the patient's hospitalization, patient had extensive initial psychiatric evaluation, and follow-up psychiatric evaluations every day.  Psychiatric diagnoses provided upon initial assessment: As above.  Patient's psychiatric medications were adjusted on admission: As follows: -Increase Haldol to 7.5 mg BID for psychosis and mood stability. -Continue Agitation Protocol: Haldol/Ativan -Continue Wellbutrin XL 300 mg QHS for depression and anxiety -Continue Zoloft 100 mg QHS for depression and anxiety  During the hospitalization, other adjustments were made to the patient's psychiatric medication regimen. Wellbutrin was discontinued and Haldol was discontinued as patient reported lack of effectiveness.   Medications at discharge are as follows: -Continue Abilify 10 mg daily on 10/3 for psychosis -Continue Zoloft 150 mg for MDD & GAD -Continue Trazodone 50 mg nightly PRN for sleep  Continue Medications for medical reasons, and f/u with PCP  -Continue Semglee 15 units QAM as per diabetic consultant   -Continue Novolog 3 units TID with meals -Held Amlodipine 2.5 mg daily & also held Losartan 12.5 mg daily starting 08/13/2023  due to hypotension. Please consult with your PCP or Cardiologist prior to resuming this medications. -Continue Aspirin EC 81 mg QHS -Continue Lipitor 80 mg QHS -Continue Plavix 75 mg daily -Continue Isordil 20 mg BID -Continue Xalatan 0.005% 1 drop Left eye QHS -Continue Synthroid 50 mcg daily -Continue Protonix EC 40 mg QHS -Continue Lyrica 75 mg BID -Continue Ranolazine 500 mg BID  Patient's care was discussed during the interdisciplinary team meeting every day during the hospitalization. The patient denies having side effects to prescribed psychiatric medication. Gradually, patient started adjusting to milieu. The patient was evaluated each day by a clinical provider to ascertain response to treatment. Improvement was noted by the patient's report of decreasing symptoms, improved sleep and appetite, affect, medication tolerance, behavior, and participation in unit programming.  Patient was asked each day to complete a self inventory noting mood, mental status, pain, new symptoms, anxiety and concerns.    Symptoms were reported as significantly decreased or resolved completely by discharge. On day of discharge, the patient reports that their mood is stable. The patient denied having suicidal thoughts for more than 48 hours prior to discharge.  Patient denies having homicidal thoughts.  Patient denies having auditory hallucinations.  Patient continues to report visual hallucinations that have not improved while on antipsychotic medications, and which have remained unchanged over the course of this hospitalization. First time psychosis labs have been completed including CT scan of head, and all studies so far, are negative. At this time, patient's symptoms are thought to be most likely related to diabetic retinopathy, and she has been educated to follow this up with her Ophthalmologic provider and endocrinologist. The patient was motivated to continue taking medication with a goal of continued  improvement in mental  health.   The patient reports their target psychiatric symptoms of depression, anxiety and insomnia responded well to the psychiatric medications, and the patient reports overall benefit other psychiatric hospitalization. Supportive psychotherapy was provided to the patient. The patient also participated in regular group therapy while hospitalized. Coping skills, problem solving as well as relaxation therapies were also part of the unit programming.  Labs were reviewed with the patient, and abnormal results were discussed with the patient.  The patient is able to verbalize their individual safety plan to this provider.  # It is recommended to the patient to continue psychiatric medications as prescribed, after discharge from the hospital.    # It is recommended to the patient to follow up with your outpatient psychiatric provider and PCP.  # It was discussed with the patient, the impact of alcohol, drugs, tobacco have been there overall psychiatric and medical wellbeing, and total abstinence from substance use was recommended the patient.ed.  # Prescriptions provided or sent directly to preferred pharmacy at discharge. Patient agreeable to plan. Given opportunity to ask questions. Appears to feel comfortable with discharge.    # In the event of worsening symptoms, the patient is instructed to call the crisis hotline (988), 911 and or go to the nearest ED for appropriate evaluation and treatment of symptoms. To follow-up with primary care provider for other medical issues, concerns and or health care needs  # Patient was discharged home with a plan to follow up as noted below.   Total Time spent with patient: 45 minutes  Musculoskeletal: Strength & Muscle Tone: decreased Gait & Station: unsteady Patient leans: N/A  Psychiatric Specialty Exam  Presentation  General Appearance:  Casual; Fairly Groomed  Eye Contact: Fair (blind)  Speech: Clear and  Coherent  Speech Volume: Normal  Handedness: Right   Mood and Affect  Mood: Euthymic  Duration of Depression Symptoms: No data recorded Affect: Congruent; Appropriate   Thought Process  Thought Processes: Coherent  Descriptions of Associations:Intact  Orientation:Full (Time, Place and Person)  Thought Content:Logical  History of Schizophrenia/Schizoaffective disorder:Yes  Duration of Psychotic Symptoms:Greater than six months  Hallucinations:Hallucinations: Visual  Ideas of Reference:None  Suicidal Thoughts:Suicidal Thoughts: No  Homicidal Thoughts:Homicidal Thoughts: No   Sensorium  Memory: Immediate Good  Judgment: Good  Insight: Good   Executive Functions  Concentration: Good  Attention Span: Fair  Recall: Good  Fund of Knowledge: Good  Language: Good   Psychomotor Activity  Psychomotor Activity:Psychomotor Activity: Normal   Assets  Assets: Communication Skills; Housing   Sleep  Sleep:Sleep: Good   Physical Exam: Physical Exam Constitutional:      Appearance: Normal appearance.  HENT:     Head: Normocephalic.     Nose: Nose normal.  Musculoskeletal:        General: Normal range of motion.     Cervical back: Normal range of motion.  Neurological:     Mental Status: She is alert and oriented to person, place, and time.    Review of Systems  Constitutional: Negative.   HENT: Negative.    Eyes: Negative.   Respiratory: Negative.    Cardiovascular: Negative.   Gastrointestinal: Negative.   Genitourinary: Negative.   Musculoskeletal: Negative.   Skin: Negative.   Neurological: Negative.   Psychiatric/Behavioral:  Positive for depression and hallucinations (thought to be due to vision changes related to diabetic retinopathy). Negative for memory loss, substance abuse and suicidal ideas. The patient is nervous/anxious (Improving) and has insomnia (improving).    Blood pressure Marland Kitchen)  96/58, pulse 76, temperature  98.9 F (37.2 C), temperature source Oral, resp. rate 18, height 5\' 2"  (1.575 m), weight 69.9 kg, SpO2 94%. Body mass index is 28.17 kg/m.  Mental Status Per Nursing Assessment::   On Admission:  Intention to act on plan to harm others  Demographic Factors:  Caucasian, Low socioeconomic status, and Unemployed  Loss Factors: Decrease in vocational status and Financial problems/change in socioeconomic status  Historical Factors: NA  Risk Reduction Factors:   Sense of responsibility to family and Living with another person, especially a relative  Continued Clinical Symptoms:  More than one psychiatric diagnosis Medical Diagnoses and Treatments/Surgeries  Cognitive Features That Contribute To Risk:  None    Suicide Risk:  Mild:  There are no identifiable suicide plans, no associated intent, mild dysphoria and related symptoms, good self-control (both objective and subjective assessment), few other risk factors, and identifiable protective factors, including available and accessible social support.    Follow-up Information     [employer]. Go to.   Why: Please go to this provider for group therapy and for medication management services.  Provider is open 24/7. Contact information: [address] [place] [address] [phone]                Starleen Blue, NP 08/20/2023, 12:15 PM

## 2023-08-20 NOTE — Progress Notes (Signed)
Nursing 1:1 note D:Pt observed sleeping in bed with eyes closed. RR even and unlabored. No distress noted. A: 1:1 observation continues for safety  R: Pt remains safe  

## 2023-08-20 NOTE — Progress Notes (Signed)
   08/20/23 0626  Vital Signs  Pulse Rate 91  BP (!) 84/56  BP Location Right Arm  BP Method Automatic  Patient Position (if appropriate) Standing   Pt feeling dizzy during VS assessment. Given Gatorade. Pt proceeded to have blood work drawn without incident. Sitter at side. Will continue to monitor.

## 2023-08-20 NOTE — BH IP Treatment Plan (Signed)
Interdisciplinary Treatment and Diagnostic Plan Update  08/20/2023 Time of Session: 9:30AM (UPDATE)  Deanna Landry MRN: [id number]  Principal Diagnosis: Schizoaffective disorder [facility])  Secondary Diagnoses: Principal Problem:   Schizoaffective disorder (HCC) Active Problems:   Dyslipidemia   Blindness and low vision   Diabetes (HCC)   Insomnia   GAD (generalized anxiety disorder)   Current Medications:  Current Facility-Administered Medications  Medication Dose Route Frequency Provider Last Rate Last Admin   acetaminophen (TYLENOL) tablet 650 mg  650 mg Oral Q6H PRN Eligha Bridegroom, NP       alum & mag hydroxide-simeth (MAALOX/MYLANTA) 200-200-20 MG/5ML suspension 30 mL  30 mL Oral Q4H PRN Eligha Bridegroom, NP       ARIPiprazole (ABILIFY) tablet 10 mg  10 mg Oral Daily Starleen Blue, NP   10 mg at 08/20/23 0800   aspirin EC tablet 81 mg  81 mg Oral QHS Eligha Bridegroom, NP   81 mg at 08/19/23 2053   atorvastatin (LIPITOR) tablet 80 mg  80 mg Oral QHS Eligha Bridegroom, NP   80 mg at 08/19/23 2054   clopidogrel (PLAVIX) tablet 75 mg  75 mg Oral Daily Eligha Bridegroom, NP   75 mg at 08/20/23 0800   docusate sodium (COLACE) capsule 100 mg  100 mg Oral BID Lauro Franklin, MD   100 mg at 08/20/23 0800   haloperidol (HALDOL) tablet 5 mg  5 mg Oral TID PRN Eligha Bridegroom, NP       Or   haloperidol lactate (HALDOL) injection 5 mg  5 mg Intramuscular TID PRN Eligha Bridegroom, NP       insulin aspart (novoLOG) injection 0-9 Units  0-9 Units Subcutaneous TID WC Eligha Bridegroom, NP   1 Units at 08/20/23 1212   insulin aspart (novoLOG) injection 3 Units  3 Units Subcutaneous TID WC Starleen Blue, NP   3 Units at 08/20/23 1212   insulin glargine-yfgn (SEMGLEE) injection 15 Units  15 Units Subcutaneous Daily Nkwenti, [name], NP   15 Units at 08/20/23 0806   latanoprost (XALATAN) 0.005 % ophthalmic solution 1 drop  1 drop Left Eye QHS Eligha Bridegroom, NP   1 drop at 08/19/23 2058    levothyroxine (SYNTHROID) tablet 50 mcg  50 mcg Oral QAC breakfast Eligha Bridegroom, NP   50 mcg at 08/20/23 0616   LORazepam (ATIVAN) tablet 2 mg  2 mg Oral TID PRN Eligha Bridegroom, NP       Or   LORazepam (ATIVAN) injection 2 mg  2 mg Intramuscular TID PRN Eligha Bridegroom, NP       pantoprazole (PROTONIX) EC tablet 40 mg  40 mg Oral QHS Eligha Bridegroom, NP   40 mg at 08/19/23 2054   pregabalin (LYRICA) capsule 75 mg  75 mg Oral BID Eligha Bridegroom, NP   75 mg at 08/20/23 0800   ranolazine (RANEXA) 12 hr tablet 500 mg  500 mg Oral BID Eligha Bridegroom, NP   500 mg at 08/20/23 0800   sertraline (ZOLOFT) tablet 150 mg  150 mg Oral QHS Nkwenti, [name], NP   150 mg at 08/19/23 2054   traZODone (DESYREL) tablet 50 mg  50 mg Oral QHS PRN Sindy Guadeloupe, NP   50 mg at 08/19/23 2054   PTA Medications: Medications Prior to Admission  Medication Sig Dispense Refill Last Dose   ACCU-CHEK GUIDE test strip USE TO CHECK BLOOD SUGAR 5 TIMES DAILY. DX E 10.65 300 strip 2    acetaminophen (TYLENOL) 325 MG tablet Take  650 mg by mouth every 6 (six) hours as needed for mild pain.      albuterol (VENTOLIN HFA) 108 (90 Base) MCG/ACT inhaler Inhale 1-2 puffs into the lungs every 6 (six) hours as needed for wheezing or shortness of breath.      ALPRAZolam (XANAX) 0.25 MG tablet Take 0.25 mg by mouth every 6 (six) hours as needed for anxiety or sleep.      amLODipine (NORVASC) 2.5 MG tablet Take 2.5 mg by mouth daily.      aspirin EC 81 MG tablet Take 81 mg by mouth daily.      atorvastatin (LIPITOR) 80 MG tablet Take 80 mg by mouth daily.      buPROPion (WELLBUTRIN XL) 300 MG 24 hr tablet Take 300 mg by mouth daily.      clopidogrel (PLAVIX) 75 MG tablet Take 75 mg by mouth at bedtime.      doxycycline (VIBRAMYCIN) 100 MG capsule Take 100 mg by mouth 2 (two) times daily. (Patient not taking: Reported on 08/10/2023)      Glucagon (GVOKE HYPOPEN 1-PACK) 1 MG/0.2ML SOAJ Inject 1 mg into the skin as needed (low blood  sugar with impaired consciousness). 0.4 mL 2    Glucagon (GVOKE HYPOPEN 2-PACK) 1 MG/0.2ML SOAJ Inject 1 mg into the skin as needed. For severe lows (Patient taking differently: Inject 1 mg into the skin as needed (low blood sugar). For severe lows) 0.4 mL 3    haloperidol (HALDOL) 5 MG tablet Take 5 mg by mouth 2 (two) times daily.      HYDROcodone-acetaminophen (NORCO/VICODIN) 5-325 MG tablet Take 1 tablet by mouth every 8 (eight) hours as needed for moderate pain.      insulin aspart (NOVOLOG FLEXPEN) 100 UNIT/ML FlexPen USE UP TO 15 UNITS BEFORE MEALS THREE TIMES DAILY 15 mL 3    insulin degludec (TRESIBA FLEXTOUCH) 100 UNIT/ML FlexTouch Pen Inject 15 Units into the skin daily. 15 mL 3    Insulin Pen Needle (EASY COMFORT PEN NEEDLES) 31G X 8 MM MISC Check sugar 3x  daily (Patient taking differently: Check sugar 3x  daily) 100 each 2    Latanoprostene Bunod (VYZULTA) 0.024 % SOLN Place 1 drop into the left eye at bedtime.      loratadine (CLARITIN) 10 MG tablet Take 10 mg by mouth daily as needed for allergies.      losartan (COZAAR) 25 MG tablet Take 25 mg by mouth daily.      OZEMPIC, 1 MG/DOSE, 4 MG/3ML SOPN Inject 1 mg into the skin once a week. wednesdays      pantoprazole (PROTONIX) 40 MG tablet Take 40 mg by mouth daily.      patiromer (VELTASSA) 8.4 g packet Take 8.4 g by mouth every other day. (Patient not taking: Reported on 08/10/2023)      Polyethyl Glycol-Propyl Glycol (SYSTANE) 0.4-0.3 % GEL ophthalmic gel Place 1 application. into both eyes 3 (three) times daily as needed (dry/irritated eyes.).      senna-docusate (SENOKOT-S) 8.6-50 MG tablet Take 2 tablets by mouth 2 (two) times daily. 60 tablet 0    sodium zirconium cyclosilicate (LOKELMA) 10 g PACK packet Take 10 g by mouth See admin instructions. MON and FRI      [DISCONTINUED] levothyroxine (SYNTHROID) 50 MCG tablet Take 50 mcg by mouth daily before breakfast.       [DISCONTINUED] pregabalin (LYRICA) 75 MG capsule Take 75 mg by  mouth 2 (two) times daily.  Patient Stressors: Other: return of visual hallucinations    Patient Strengths: Ability for insight  Communication skills  Motivation for treatment/growth   Treatment Modalities: Medication Management, Group therapy, Case management,  1 to 1 session with clinician, Psychoeducation, Recreational therapy.   Physician Treatment Plan for Primary Diagnosis: Schizoaffective disorder (HCC) Long Term Goal(s): Improvement in symptoms so as ready for discharge   Short Term Goals: Ability to identify changes in lifestyle to reduce recurrence of condition will improve Ability to verbalize feelings will improve Ability to disclose and discuss suicidal ideas Ability to demonstrate self-control will improve Ability to identify and develop effective coping behaviors will improve Ability to maintain clinical measurements within normal limits will improve Compliance with prescribed medications will improve  Medication Management: Evaluate patient's response, side effects, and tolerance of medication regimen.  Therapeutic Interventions: 1 to 1 sessions, Unit Group sessions and Medication administration.  Evaluation of Outcomes: Adequate for Discharge  Physician Treatment Plan for Secondary Diagnosis: Principal Problem:   Schizoaffective disorder (HCC) Active Problems:   Dyslipidemia   Blindness and low vision   Diabetes (HCC)   Insomnia   GAD (generalized anxiety disorder)  Long Term Goal(s): Improvement in symptoms so as ready for discharge   Short Term Goals: Ability to identify changes in lifestyle to reduce recurrence of condition will improve Ability to verbalize feelings will improve Ability to disclose and discuss suicidal ideas Ability to demonstrate self-control will improve Ability to identify and develop effective coping behaviors will improve Ability to maintain clinical measurements within normal limits will improve Compliance with prescribed  medications will improve     Medication Management: Evaluate patient's response, side effects, and tolerance of medication regimen.  Therapeutic Interventions: 1 to 1 sessions, Unit Group sessions and Medication administration.  Evaluation of Outcomes: Adequate for Discharge   RN Treatment Plan for Primary Diagnosis: Schizoaffective disorder (HCC) Long Term Goal(s): Knowledge of disease and therapeutic regimen to maintain health will improve  Short Term Goals: Ability to remain free from injury will improve, Ability to verbalize frustration and anger appropriately will improve, Ability to participate in decision making will improve, Ability to verbalize feelings will improve, Ability to identify and develop effective coping behaviors will improve, and Compliance with prescribed medications will improve  Medication Management: RN will administer medications as ordered by provider, will assess and evaluate patient's response and provide education to patient for prescribed medication. RN will report any adverse and/or side effects to prescribing provider.  Therapeutic Interventions: 1 on 1 counseling sessions, Psychoeducation, Medication administration, Evaluate responses to treatment, Monitor vital signs and CBGs as ordered, Perform/monitor CIWA, COWS, AIMS and Fall Risk screenings as ordered, Perform wound care treatments as ordered.  Evaluation of Outcomes: Adequate for Discharge   LCSW Treatment Plan for Primary Diagnosis: Schizoaffective disorder [facility]) Long Term Goal(s): Safe transition to appropriate next level of care at discharge, Engage patient in therapeutic group addressing interpersonal concerns.  Short Term Goals: Engage patient in aftercare planning with referrals and resources, Increase social support, Increase emotional regulation, Facilitate acceptance of mental health diagnosis and concerns, Identify triggers associated with mental health/substance abuse issues, and Increase  skills for wellness and recovery  Therapeutic Interventions: Assess for all discharge needs, 1 to 1 time with Social worker, Explore available resources and support systems, Assess for adequacy in community support network, Educate family and significant other(s) on suicide prevention, Complete Psychosocial Assessment, Interpersonal group therapy.  Evaluation of Outcomes: Adequate for Discharge   Progress in Treatment: Attending groups: Yes  Participating in groups: Yes. Taking medication as prescribed: Yes. Toleration medication: Yes. Family/Significant other contact made: Yes, iRuby (mom) [phone]  Patient understands diagnosis: Yes. Discussing patient identified problems/goals with staff: Yes. Medical problems stabilized or resolved: Yes. Denies suicidal/homicidal ideation: Yes. Issues/concerns per patient self-inventory: No.    New problem(s) identified: No, Describe:  None reported   New Short Term/Long Term Goal(s): medication stabilization, elimination of SI thoughts, development of comprehensive mental wellness plan.    Patient Goals:  Medication stabilization   Discharge Plan or Barriers: :  Patient recently admitted. CSW will continue to follow and assess for appropriate referrals and possible discharge planning.    Reason for Continuation of Hospitalization: Depression Hallucinations Medication stabilization Suicidal ideation   Estimated Length of Stay: Pt is scheduled to DC today   Last 3 [place] Suicide Severity Risk Score: Flowsheet Row [facility] (Current) [facility]2023 [facility] ED from [facility]/17/2022 in [facility] Department at [facility]  C-SSRS RISK CATEGORY No Risk High Risk No Risk       Last PHQ 2/9 Scores:    08/31/2019    2:42 PM  Depression screen PHQ 2/9  Decreased Interest 0  Down, Depressed, Hopeless 0  PHQ -  2 Score 0    Scribe for Treatment Team: Isabella Bowens, LCSWA 08/20/2023 1:18 PM

## 2023-08-20 NOTE — Group Note (Signed)
Date:  08/20/2023 Time:  10:54 AM  Group Topic/Focus:  Goals Group:   The focus of this group is to help patients establish daily goals to achieve during treatment and discuss how the patient can incorporate goal setting into their daily lives to aide in recovery.    Participation Level:  Did Not Attend  Participation Quality:   NA  Affect:   NA  Cognitive:   NA  Insight: None  Engagement in Group:   NA  Modes of Intervention:   NA  Additional Comments:    Beckie Busing 08/20/2023, 10:54 AM

## 2023-08-21 LAB — HEAVY METALS, BLOOD
Arsenic: 1 ug/L (ref 0–9)
Lead: <1 ug/dL (ref 0.0–3.4)
Mercury: <1 ug/L (ref 0.0–14.9)

## 2023-08-21 LAB — CERULOPLASMIN: Ceruloplasmin: 26 mg/dL (ref 19.0–39.0)

## 2023-08-27 ENCOUNTER — Ambulatory Visit: Payer: Medicare HMO | Admitting: "Endocrinology

## 2023-09-09 ENCOUNTER — Ambulatory Visit (INDEPENDENT_AMBULATORY_CARE_PROVIDER_SITE_OTHER): Payer: Medicare HMO | Admitting: "Endocrinology

## 2023-09-09 ENCOUNTER — Encounter: Payer: Self-pay | Admitting: "Endocrinology

## 2023-09-09 VITALS — BP 102/62 | HR 56 | Ht 62.0 in | Wt 160.0 lb

## 2023-09-09 DIAGNOSIS — E10649 Type 1 diabetes mellitus with hypoglycemia without coma: Secondary | ICD-10-CM

## 2023-09-09 DIAGNOSIS — Z7985 Long-term (current) use of injectable non-insulin antidiabetic drugs: Secondary | ICD-10-CM

## 2023-09-09 DIAGNOSIS — E7800 Pure hypercholesterolemia, unspecified: Secondary | ICD-10-CM | POA: Diagnosis not present

## 2023-09-09 MED ORDER — TRESIBA FLEXTOUCH 100 UNIT/ML ~~LOC~~ SOPN: 14.0000 [IU] | PEN_INJECTOR | Freq: Every day | SUBCUTANEOUS | 1 refills | Status: AC

## 2023-09-09 MED ORDER — INSULIN ASPART 100 UNIT/ML IJ SOLN: 8.0000 [IU] | Freq: Three times a day (TID) | INTRAMUSCULAR | 1 refills | Status: DC

## 2023-09-09 MED ORDER — GVOKE HYPOPEN 1-PACK 1 MG/0.2ML ~~LOC~~ SOAJ: 1.0000 mg | SUBCUTANEOUS | 2 refills | Status: DC | PRN

## 2023-09-09 NOTE — Patient Instructions (Signed)

## 2023-09-22 ENCOUNTER — Ambulatory Visit (INDEPENDENT_AMBULATORY_CARE_PROVIDER_SITE_OTHER): Payer: Medicare HMO | Admitting: "Endocrinology

## 2023-09-22 ENCOUNTER — Encounter: Payer: Self-pay | Admitting: "Endocrinology

## 2023-09-22 VITALS — BP 140/70 | HR 81 | Ht 62.0 in | Wt 163.6 lb

## 2023-09-22 DIAGNOSIS — E7800 Pure hypercholesterolemia, unspecified: Secondary | ICD-10-CM | POA: Diagnosis not present

## 2023-09-22 DIAGNOSIS — Z7985 Long-term (current) use of injectable non-insulin antidiabetic drugs: Secondary | ICD-10-CM

## 2023-09-22 DIAGNOSIS — E10649 Type 1 diabetes mellitus with hypoglycemia without coma: Secondary | ICD-10-CM | POA: Diagnosis not present

## 2023-09-22 LAB — POCT GLYCOSYLATED HEMOGLOBIN (HGB A1C): Hemoglobin A1C: 6.2 % — AB (ref 4.0–5.6)

## 2023-09-22 NOTE — Patient Instructions (Signed)
14 units Tresiba daily Novolog 4-6 units tid fifteen min before meals  (skip novolog if sugar <70) Ozempic 1 mg weekly

## 2023-09-22 NOTE — Progress Notes (Signed)
Outpatient Endocrinology Note Deanna Alder, MD  09/22/23   Deanna Landry [id number]  Referring Provider: Simone Curia, MD Primary Care Provider: Simone Curia, MD Reason for consultation: Subjective   Assessment & Plan  Diagnoses and all orders for this visit:  Uncontrolled type 1 diabetes mellitus with hypoglycemia without coma (HCC) -     POCT glycosylated hemoglobin (Hb A1C) -     Ambulatory referral to diabetic education  Pure hypercholesterolemia  Long-term (current) use of injectable non-insulin antidiabetic drugs   Diabetes Type I complicated by MI, stroke, neuropathy  Lab Results  Component Value Date   GFR 27.95 (L) 12/09/2021   Hba1c goal less than 7, current Hba1c is  Lab Results  Component Value Date   HGBA1C 6.2 (A) 09/22/2023   Will recommend the following:  14 units Tresiba daily Novolog 4-6 units tid fifteen min before meals  (skip novolog if BG <70) Ozempic 1 mg weekly  Reinforced need to dose decrease as not done last time Discussed dangers of hypoglycemia, patient reports being legally blind and counts the clicks for insulin, instructed patient and mom for supervised doses by mom, mom agrees Explained same to mom who is accompanied with patient   No known contraindications/side effects to any of above medications Glucagon discussed and prescribed with refills on 09/09/23 Lives with mother  -Last LD and Tg are as follows: Lab Results  Component Value Date   LDLCALC 33 08/12/2023    Lab Results  Component Value Date   TRIG 168 (H) 08/12/2023   -On atorvastatin 80 mg every day (non fasting sample led to Tg 168) -Follow low fat diet and exercise   -Blood pressure goal <140/90 - Microalbumin/creatinine goal is < 30 -Last MA/Cr is as follows: Lab Results  Component Value Date   MICROALBUR 5.9 (H) 02/05/2022   -on ACE/ARB losartan 25 mg qd -diet changes including salt restriction -limit eating outside -counseled BP targets  per standards of diabetes care -uncontrolled blood pressure can lead to retinopathy, nephropathy and cardiovascular and atherosclerotic heart disease  Reviewed and counseled on: -A1C target -Blood sugar targets -Complications of uncontrolled diabetes  -Checking blood sugar before meals and bedtime and bring log next visit -All medications with mechanism of action and side effects -Hypoglycemia management: rule of 15's, Glucagon Emergency Kit and medical alert ID -low-carb low-fat plate-method diet -At least 20 minutes of physical activity per day -Annual dilated retinal eye exam and foot exam -compliance and follow up needs -follow up as scheduled or earlier if problem gets worse  Call if blood sugar is less than 70 or consistently above 250    Take a 15 gm snack of carbohydrate at bedtime before you go to sleep if your blood sugar is less than 100.    If you are going to fast after midnight for a test or procedure, ask your physician for instructions on how to reduce/decrease your insulin dose.    Call if blood sugar is less than 70 or consistently above 250  -Treating a low sugar by rule of 15  (15 gms of sugar every 15 min until sugar is more than 70) If you feel your sugar is low, test your sugar to be sure If your sugar is low (less than 70), then take 15 grams of a fast acting Carbohydrate (3-4 glucose tablets or glucose gel or 4 ounces of juice or regular soda) Recheck your sugar 15 min after treating low to make sure it is  more than 70 If sugar is still less than 70, treat again with 15 grams of carbohydrate          Don't drive the hour of hypoglycemia  If unconscious/unable to eat or drink by mouth, use glucagon injection or nasal spray baqsimi and call 911. Can repeat again in 15 min if still unconscious.  Return in about 8 days (around 09/30/2023).   I have reviewed current medications, nurse's notes, allergies, vital signs, past medical and surgical history, family  medical history, and social history for this encounter. Counseled patient on symptoms, examination findings, lab findings, imaging results, treatment decisions and monitoring and prognosis. The patient understood the recommendations and agrees with the treatment plan. All questions regarding treatment plan were fully answered.  Deanna , MD  09/22/23   History of Present Illness Deanna Landry is a 52 y.o. year old female who presents for follow up of Type I diabetes mellitus.  Deanna Landry was first diagnosed in age 75.    Home diabetes regimen: 14 units Tresiba daily Novolog 8 units tid after meals  Ozempic 1 mg weekly  Liked Trulicity 1.5 mg weekly better, but not covered by insurance per pt   Previous history:   She has been on insulin since her diagnosis when she had significant symptomatic hypoglycemia. Has been on various types of insulin regimens in the past but usually not followed by endocrinologist The last 5 or 6 years she has been on Levemir and NovoLog Previous level of control is variable with A1c only inconsistently below 7  She was on the T-slim pump started on 03/04/21 Settings: Basal rates: 12 AM- 2 AM = 0.60, 2 AM-8 AM = 0.65, 8 AM-12 AM = 0.50 ISF: 1: 60 except from 2-8 AM, mealtime carbohydrate ratio 1:1 Target 120 Active insulin 5 hours  COMPLICATIONS +  MI/Stroke -  retinopathy +  neuropathy +  nephropathy  BLOOD SUGAR DATA  CGM interpretation: At today's visit, we reviewed her CGM downloads. The full report is scanned in the media. Reviewing the CGM trends, BG are low across the day.   Physical Exam  BP (!) 140/70   Pulse 81   Ht 5\' 2"  (1.575 m)   Wt 163 lb 9.6 oz (74.2 kg)   LMP  (LMP Unknown) Comment: hysterectomy 2014  SpO2 94%   BMI 29.92 kg/m    Constitutional: well developed, well nourished Head: normocephalic, atraumatic Eyes: sclera anicteric, no redness Neck: supple Lungs: normal respiratory effort Neurology: alert and  oriented Skin: dry, no appreciable rashes Musculoskeletal: no appreciable defects Psychiatric: normal mood and affect Diabetic Foot Exam - Simple   No data filed      Current Medications Patient's Medications  New Prescriptions   No medications on file  Previous Medications   ALBUTEROL (VENTOLIN HFA) 108 (90 BASE) MCG/ACT INHALER    Inhale 1-2 puffs into the lungs every 6 (six) hours as needed for wheezing or shortness of breath.   AMLODIPINE (NORVASC) 2.5 MG TABLET    Take 2.5 mg by mouth daily.   ARIPIPRAZOLE (ABILIFY) 10 MG TABLET    Take 1 tablet (10 mg total) by mouth daily.   ASPIRIN EC 81 MG TABLET    Take 1 tablet (81 mg total) by mouth at bedtime. Swallow whole.   ATORVASTATIN (LIPITOR) 80 MG TABLET    Take 1 tablet (80 mg total) by mouth at bedtime.   CLOPIDOGREL (PLAVIX) 75 MG TABLET    Take 75 mg  by mouth at bedtime.   DOCUSATE SODIUM (COLACE) 100 MG CAPSULE    Take 1 capsule (100 mg total) by mouth 2 (two) times daily.   GLUCAGON (GVOKE HYPOPEN 1-PACK) 1 MG/0.2ML SOAJ    Inject 1 mg into the skin as needed (low blood sugar with impaired consciousness).   INSULIN ASPART (NOVOLOG) 100 UNIT/ML INJECTION    Inject 8 Units into the skin 3 (three) times daily with meals.   INSULIN DEGLUDEC (TRESIBA FLEXTOUCH) 100 UNIT/ML FLEXTOUCH PEN    Inject 14 Units into the skin daily.   LATANOPROST (XALATAN) 0.005 % OPHTHALMIC SOLUTION    Place 1 drop into the left eye at bedtime.   LEVOTHYROXINE (SYNTHROID) 50 MCG TABLET    Take 1 tablet (50 mcg total) by mouth daily before breakfast.   LORATADINE (CLARITIN) 10 MG TABLET    Take 10 mg by mouth daily as needed for allergies.   LOSARTAN (COZAAR) 25 MG TABLET    Take 25 mg by mouth daily.   PANTOPRAZOLE (PROTONIX) 40 MG TABLET    Take 1 tablet (40 mg total) by mouth at bedtime.   PREGABALIN (LYRICA) 75 MG CAPSULE    Take 1 capsule (75 mg total) by mouth 2 (two) times daily.   RANOLAZINE (RANEXA) 500 MG 12 HR TABLET    Take 1 tablet (500 mg  total) by mouth 2 (two) times daily.   SEMAGLUTIDE (OZEMPIC, 1 MG/DOSE, [place])    Inject 1 mg into the skin once a week.   SERTRALINE (ZOLOFT) 50 MG TABLET    Take 3 tablets (150 mg total) by mouth at bedtime.   TRAZODONE (DESYREL) 50 MG TABLET    Take 1 tablet (50 mg total) by mouth at bedtime as needed for sleep.  Modified Medications   No medications on file  Discontinued Medications   No medications on file    Allergies No Known Allergies  Past Medical History Past Medical History:  Diagnosis Date   Acute kidney injury superimposed on CKD (HCC) 03/15/2019   Acute respiratory failure with hypoxia (HCC) 03/15/2019   Adjustment disorder with mixed anxiety and depressed mood 03/11/2019   Anemia    Anxiety    Arthritis    right ankle   Blind    right eye   CHF (congestive heart failure) (HCC)    Closed right pilon fracture, initial encounter 07/26/2019   COPD (chronic obstructive pulmonary disease) (HCC)    Coronary artery disease    Depression    Diabetes mellitus type 2 in obese 07/26/2019   Diabetes mellitus without complication (HCC)    type 1   Diabetic polyneuropathy (HCC) 05/04/2016   Dyslipidemia 01/03/2020   Fever 03/10/2019   GERD (gastroesophageal reflux disease)    History of blood transfusion    HLD (hyperlipidemia)    Hyperglycemia    Hypertension    Hypothyroidism    Ileus, postoperative (HCC)    Major depression, recurrent (HCC) 03/09/2019   MDD (major depressive disorder), severe (HCC) 03/09/2019   Myocardial infarct, old    2020, s/p DES RCA, RPDA 10/23/19   Neck pain 04/21/2016   Pneumonia 10/2019   Renal disorder    stage 3   Sepsis (HCC) 03/10/2019   Short-term memory loss    mild   Sleep apnea    study done 9/27 and 08/13/20 - no results yet 08/21/20   Stroke (HCC)    Suicidal ideation 03/15/2019   Type 1 diabetes mellitus without complication (HCC) 03/15/2019  Wound infection after surgery (Right ankle) 10/04/2019    Past Surgical History Past Surgical  History:  Procedure Laterality Date   ABDOMINAL HYSTERECTOMY  2014   ANKLE FUSION Right 08/23/2020   Procedure: ANKLE FUSION;  Surgeon: Roby Lofts, MD;  Location: [facility] OR;  Service: Orthopedics;  Laterality: Right;   APPLICATION OF WOUND VAC Right 10/11/2019   Procedure: WOUND VAC CHANGE TO RIGHT LEG;  Surgeon: Roby Lofts, MD;  Location: [facility] OR;  Service: Orthopedics;  Laterality: Right;   CORONARY ANGIOPLASTY  10/23/2019   DES RCA, DES RPDA 10/23/19 [facility])   EYE SURGERY     HARDWARE REMOVAL Right 10/06/2019   Procedure: HARDWARE REMOVAL;  Surgeon: Roby Lofts, MD;  Location: [facility] OR;  Service: Orthopedics;  Laterality: Right;   HARDWARE REMOVAL Right 08/23/2020   Procedure: HARDWARE REMOVAL;  Surgeon: Roby Lofts, MD;  Location: [facility] OR;  Service: Orthopedics;  Laterality: Right;   I & D EXTREMITY Right 10/06/2019   Procedure: IRRIGATION AND DEBRIDEMENT EXTREMITY;  Surgeon: Roby Lofts, MD;  Location: [facility] OR;  Service: Orthopedics;  Laterality: Right;   I & D EXTREMITY Right 10/09/2019   Procedure: IRRIGATION AND DEBRIDEMENT EXTREMITY and WOUND VAC CHANGE RIGHT ANKLE;  Surgeon: Roby Lofts, MD;  Location: [facility] OR;  Service: Orthopedics;  Laterality: Right;   ORIF ANKLE FRACTURE Right 07/26/2019   Procedure: OPEN REDUCTION INTERNAL FIXATION (ORIF) ANKLE FRACTURE;  Surgeon: Roby Lofts, MD;  Location: [facility] OR;  Service: Orthopedics;  Laterality: Right;  OPEN REDUCTION INTERNAL FIXATION (ORIF) ANKLE FRACTURE    TOE AMPUTATION      Family History family history includes Congestive Heart Failure in her father and maternal grandfather; Diabetes in her sister; Diabetes Mellitus II in her mother; Heart attack in her mother; Stomach cancer in her father; Stroke in her mother.  Social History Social History   Socioeconomic History   Marital status: Divorced    Spouse name: Not on file   Number of children: Not on file   Years of education: Not on file   Highest education level:  Not on file  Occupational History   Not on file  Tobacco Use   Smoking status: Never   Smokeless tobacco: Never  Vaping Use   Vaping status: Never Used  Substance and Sexual Activity   Alcohol use: Not Currently   Drug use: Not Currently   Sexual activity: Not on file    Comment: Hysterectomy  Other Topics Concern   Not on file  Social History Narrative   Not on file   Social Determinants of Health   Financial Resource Strain: Not on file  Food Insecurity: No Food Insecurity (08/10/2023)   Hunger Vital Sign    Worried About Running Out of Food in the Last Year: Never true    Ran Out of Food in the Last Year: Never true  Transportation Needs: No Transportation Needs (08/10/2023)   PRAPARE - Administrator, Civil Service (Medical): No    Lack of Transportation (Non-Medical): No  Physical Activity: Not on file  Stress: Not on file  Social Connections: Not on file  Intimate Partner Violence: Not At Risk (08/10/2023)   Humiliation, Afraid, Rape, and Kick questionnaire    Fear of Current or Ex-Partner: No    Emotionally Abused: No    Physically Abused: No    Sexually Abused: No    Lab Results  Component Value Date   HGBA1C 6.2 (A) 09/22/2023  HGBA1C 6.9 (H) 06/29/2023   HGBA1C 5.9 (A) 03/22/2023   Lab Results  Component Value Date   CHOL 100 08/12/2023   Lab Results  Component Value Date   HDL 33 (L) 08/12/2023   Lab Results  Component Value Date   LDLCALC 33 08/12/2023   Lab Results  Component Value Date   TRIG 168 (H) 08/12/2023   Lab Results  Component Value Date   CHOLHDL 3.0 08/12/2023   Lab Results  Component Value Date   CREATININE 1.84 (H) 08/18/2023   Lab Results  Component Value Date   GFR 27.95 (L) 12/09/2021   Lab Results  Component Value Date   MICROALBUR 5.9 (H) 02/05/2022      Component Value Date/Time   NA 135 08/18/2023 0633   K 4.6 08/18/2023 0633   CL 100 08/18/2023 0633   CO2 26 08/18/2023 0633   GLUCOSE 380  (H) 08/18/2023 0633   BUN 33 (H) 08/18/2023 0633   CREATININE 1.84 (H) 08/18/2023 0633   CALCIUM 9.1 08/18/2023 0633   PROT 6.4 (L) 08/12/2023 1834   ALBUMIN 3.3 (L) 08/12/2023 1834   AST 17 08/12/2023 1834   ALT 17 08/12/2023 1834   ALKPHOS 122 08/12/2023 1834   BILITOT 0.5 08/12/2023 1834   GFRNONAA 33 (L) 08/18/2023 0633   GFRAA 31 (L) 07/12/2020 1356      Latest Ref Rng & Units 08/18/2023    6:33 AM 08/12/2023    6:34 PM 08/09/2023    6:48 PM  BMP  Glucose 70 - 99 mg/dL 409  811  914   BUN 6 - 20 mg/dL 33  49  24   Creatinine 0.44 - 1.00 mg/dL 7.82  9.56  2.13   Sodium 135 - 145 mmol/L 135  139  137   Potassium 3.5 - 5.1 mmol/L 4.6  4.6  4.7   Chloride 98 - 111 mmol/L 100  105  101   CO2 22 - 32 mmol/L 26  26  26    Calcium 8.9 - 10.3 mg/dL 9.1  8.8  8.8        Component Value Date/Time   WBC 4.4 08/09/2023 1848   RBC 4.44 08/09/2023 1848   HGB 12.7 08/09/2023 1848   HCT 39.0 08/09/2023 1848   PLT 171 08/09/2023 1848   MCV 87.8 08/09/2023 1848   MCH 28.6 08/09/2023 1848   MCHC 32.6 08/09/2023 1848   RDW 13.5 08/09/2023 1848   LYMPHSABS 1.0 08/09/2023 1848   MONOABS 0.3 08/09/2023 1848   EOSABS 0.0 08/09/2023 1848   BASOSABS 0.0 08/09/2023 1848     Parts of this note may have been dictated using voice recognition software. There may be variances in spelling and vocabulary which are unintentional. Not all errors are proofread. Please notify the Thereasa Parkin if any discrepancies are noted or if the meaning of any statement is not clear.

## 2023-09-29 ENCOUNTER — Encounter: Payer: Self-pay | Admitting: "Endocrinology

## 2023-09-30 ENCOUNTER — Encounter: Payer: Self-pay | Admitting: "Endocrinology

## 2023-09-30 ENCOUNTER — Ambulatory Visit (INDEPENDENT_AMBULATORY_CARE_PROVIDER_SITE_OTHER): Payer: Medicare HMO | Admitting: "Endocrinology

## 2023-09-30 VITALS — BP 130/64 | HR 72 | Ht 62.0 in | Wt 164.6 lb

## 2023-09-30 DIAGNOSIS — E10649 Type 1 diabetes mellitus with hypoglycemia without coma: Secondary | ICD-10-CM | POA: Diagnosis not present

## 2023-09-30 DIAGNOSIS — Z7985 Long-term (current) use of injectable non-insulin antidiabetic drugs: Secondary | ICD-10-CM

## 2023-09-30 DIAGNOSIS — E7800 Pure hypercholesterolemia, unspecified: Secondary | ICD-10-CM | POA: Diagnosis not present

## 2023-09-30 NOTE — Progress Notes (Signed)
Outpatient Endocrinology Note Deanna Ramona, MD  09/30/23   Deanna Landry 09-15-1971 [id number]  Referring Provider: Simone Curia, MD Primary Care Provider: Simone Curia, MD Reason for consultation: Subjective   Assessment & Plan  Diagnoses and all orders for this visit:  Uncontrolled type 1 diabetes mellitus with hypoglycemia without coma (HCC)  Long-term (current) use of injectable non-insulin antidiabetic drugs  Pure hypercholesterolemia    Diabetes Type I complicated by MI, stroke, neuropathy  Lab Results  Component Value Date   GFR 27.95 (L) 12/09/2021   Hba1c goal less than 7, current Hba1c is  Lab Results  Component Value Date   HGBA1C 6.2 (A) 09/22/2023   Will recommend the following:  14 units Tresiba daily Ozempic 1 mg weekly  Stop Novolog 5 units tid fifteen min before meals  (skip novolog if BG <70)  Discussed dangers of hypoglycemia, patient reports being legally blind and counts the clicks for insulin, previously instructed patient and mom for supervised doses by mom, mom agreed  No known contraindications/side effects to any of above medications Glucagon discussed and prescribed with refills on 09/09/23 Lives with mother  -Last LD and Tg are as follows: Lab Results  Component Value Date   LDLCALC 33 08/12/2023    Lab Results  Component Value Date   TRIG 168 (H) 08/12/2023   -On atorvastatin 80 mg every day (non fasting sample led to Tg 168) -Follow low fat diet and exercise   -Blood pressure goal <140/90 - Microalbumin/creatinine goal is < 30 -Last MA/Cr is as follows: Lab Results  Component Value Date   MICROALBUR 5.9 (H) 02/05/2022   -on ACE/ARB losartan 25 mg qd -diet changes including salt restriction -limit eating outside -counseled BP targets per standards of diabetes care -uncontrolled blood pressure can lead to retinopathy, nephropathy and cardiovascular and atherosclerotic heart disease  Reviewed and counseled on: -A1C  target -Blood sugar targets -Complications of uncontrolled diabetes  -Checking blood sugar before meals and bedtime and bring log next visit -All medications with mechanism of action and side effects -Hypoglycemia management: rule of 15's, Glucagon Emergency Kit and medical alert ID -low-carb low-fat plate-method diet -At least 20 minutes of physical activity per day -Annual dilated retinal eye exam and foot exam -compliance and follow up needs -follow up as scheduled or earlier if problem gets worse  Call if blood sugar is less than 70 or consistently above 250    Take a 15 gm snack of carbohydrate at bedtime before you go to sleep if your blood sugar is less than 100.    If you are going to fast after midnight for a test or procedure, ask your physician for instructions on how to reduce/decrease your insulin dose.    Call if blood sugar is less than 70 or consistently above 250  -Treating a low sugar by rule of 15  (15 gms of sugar every 15 min until sugar is more than 70) If you feel your sugar is low, test your sugar to be sure If your sugar is low (less than 70), then take 15 grams of a fast acting Carbohydrate (3-4 glucose tablets or glucose gel or 4 ounces of juice or regular soda) Recheck your sugar 15 min after treating low to make sure it is more than 70 If sugar is still less than 70, treat again with 15 grams of carbohydrate          Don't drive the hour of hypoglycemia  If unconscious/unable to  eat or drink by mouth, use glucagon injection or nasal spray baqsimi and call 911. Can repeat again in 15 min if still unconscious.  Return in about 1 week (around 10/07/2023) for 9 am.   I have reviewed current medications, nurse's notes, allergies, vital signs, past medical and surgical history, family medical history, and social history for this encounter. Counseled patient on symptoms, examination findings, lab findings, imaging results, treatment decisions and monitoring and  prognosis. The patient understood the recommendations and agrees with the treatment plan. All questions regarding treatment plan were fully answered.  Deanna [place], MD  09/30/23   History of Present Illness Deanna Landry is a 52 y.o. year old female who presents for follow up of Type I diabetes mellitus.  Deanna Landry was first diagnosed in age 53.    Home diabetes regimen: 14 units Tresiba daily Novolog 5 units tid after meals  Ozempic 1 mg weekly  Liked Trulicity 1.5 mg weekly better, but not covered by insurance per pt   Previous history:   She has been on insulin since her diagnosis when she had significant symptomatic hypoglycemia. Has been on various types of insulin regimens in the past but usually not followed by endocrinologist The last 5 or 6 years she has been on Levemir and NovoLog Previous level of control is variable with A1c only inconsistently below 7  She was on the T-slim pump started on 03/04/21 Settings: Basal rates: 12 AM- 2 AM = 0.60, 2 AM-8 AM = 0.65, 8 AM-12 AM = 0.50 ISF: 1: 60 except from 2-8 AM, mealtime carbohydrate ratio 1:1 Target 120 Active insulin 5 hours  COMPLICATIONS +  MI/Stroke -  retinopathy +  neuropathy +  nephropathy  BLOOD SUGAR DATA  CGM interpretation: At today's visit, we reviewed her CGM downloads. The full report is scanned in the media. Reviewing the CGM trends, BG are low across the day.   Physical Exam  BP 130/64   Pulse 72   Ht 5\' 2"  (1.575 m)   Wt 164 lb 9.6 oz (74.7 kg)   LMP  (LMP Unknown) Comment: hysterectomy 2014  SpO2 97%   BMI 30.11 kg/m    Constitutional: well developed, well nourished Head: normocephalic, atraumatic Eyes: sclera anicteric, no redness Neck: supple Lungs: normal respiratory effort Neurology: alert and oriented Skin: dry, no appreciable rashes Musculoskeletal: no appreciable defects Psychiatric: normal mood and affect Diabetic Foot Exam - Simple   No data filed      Current  Medications Patient's Medications  New Prescriptions   No medications on file  Previous Medications   ALBUTEROL (VENTOLIN HFA) 108 (90 BASE) MCG/ACT INHALER    Inhale 1-2 puffs into the lungs every 6 (six) hours as needed for wheezing or shortness of breath.   AMLODIPINE (NORVASC) 2.5 MG TABLET    Take 2.5 mg by mouth daily.   ARIPIPRAZOLE (ABILIFY) 10 MG TABLET    Take 1 tablet (10 mg total) by mouth daily.   ASPIRIN EC 81 MG TABLET    Take 1 tablet (81 mg total) by mouth at bedtime. Swallow whole.   ATORVASTATIN (LIPITOR) 80 MG TABLET    Take 1 tablet (80 mg total) by mouth at bedtime.   CLOPIDOGREL (PLAVIX) 75 MG TABLET    Take 75 mg by mouth at bedtime.   DOCUSATE SODIUM (COLACE) 100 MG CAPSULE    Take 1 capsule (100 mg total) by mouth 2 (two) times daily.   GLUCAGON (GVOKE HYPOPEN 1-PACK) 1  MG/0.2ML SOAJ    Inject 1 mg into the skin as needed (low blood sugar with impaired consciousness).   INSULIN ASPART (NOVOLOG) 100 UNIT/ML INJECTION    Inject 8 Units into the skin 3 (three) times daily with meals.   INSULIN DEGLUDEC (TRESIBA FLEXTOUCH) 100 UNIT/ML FLEXTOUCH PEN    Inject 14 Units into the skin daily.   LATANOPROST (XALATAN) 0.005 % OPHTHALMIC SOLUTION    Place 1 drop into the left eye at bedtime.   LEVOTHYROXINE (SYNTHROID) 50 MCG TABLET    Take 1 tablet (50 mcg total) by mouth daily before breakfast.   LORATADINE (CLARITIN) 10 MG TABLET    Take 10 mg by mouth daily as needed for allergies.   LOSARTAN (COZAAR) 25 MG TABLET    Take 25 mg by mouth daily.   PANTOPRAZOLE (PROTONIX) 40 MG TABLET    Take 1 tablet (40 mg total) by mouth at bedtime.   PREGABALIN (LYRICA) 75 MG CAPSULE    Take 1 capsule (75 mg total) by mouth 2 (two) times daily.   RANOLAZINE (RANEXA) 500 MG 12 HR TABLET    Take 1 tablet (500 mg total) by mouth 2 (two) times daily.   SEMAGLUTIDE (OZEMPIC, 1 MG/DOSE, [place])    Inject 1 mg into the skin once a week.   SERTRALINE (ZOLOFT) 50 MG TABLET    Take 3 tablets (150 mg  total) by mouth at bedtime.   TRAZODONE (DESYREL) 50 MG TABLET    Take 1 tablet (50 mg total) by mouth at bedtime as needed for sleep.  Modified Medications   No medications on file  Discontinued Medications   No medications on file    Allergies No Known Allergies  Past Medical History Past Medical History:  Diagnosis Date   Acute kidney injury superimposed on CKD (HCC) 03/15/2019   Acute respiratory failure with hypoxia (HCC) 03/15/2019   Adjustment disorder with mixed anxiety and depressed mood 03/11/2019   Anemia    Anxiety    Arthritis    right ankle   Blind    right eye   CHF (congestive heart failure) (HCC)    Closed right pilon fracture, initial encounter 07/26/2019   COPD (chronic obstructive pulmonary disease) (HCC)    Coronary artery disease    Depression    Diabetes mellitus type 2 in obese 07/26/2019   Diabetes mellitus without complication (HCC)    type 1   Diabetic polyneuropathy (HCC) 05/04/2016   Dyslipidemia 01/03/2020   Fever 03/10/2019   GERD (gastroesophageal reflux disease)    History of blood transfusion    HLD (hyperlipidemia)    Hyperglycemia    Hypertension    Hypothyroidism    Ileus, postoperative (HCC)    Major depression, recurrent (HCC) 03/09/2019   MDD (major depressive disorder), severe (HCC) 03/09/2019   Myocardial infarct, old    2020, s/p DES RCA, RPDA 10/23/19   Neck pain 04/21/2016   Pneumonia 10/2019   Renal disorder    stage 3   Sepsis (HCC) 03/10/2019   Short-term memory loss    mild   Sleep apnea    study done 9/27 and 08/13/20 - no results yet 08/21/20   Stroke (HCC)    Suicidal ideation 03/15/2019   Type 1 diabetes mellitus without complication (HCC) 03/15/2019   Wound infection after surgery (Right ankle) 10/04/2019    Past Surgical History Past Surgical History:  Procedure Laterality Date   ABDOMINAL HYSTERECTOMY  2014   ANKLE FUSION Right 08/23/2020  Procedure: ANKLE FUSION;  Surgeon: Roby Lofts, MD;  Location: [facility] OR;   Service: Orthopedics;  Laterality: Right;   APPLICATION OF WOUND VAC Right 10/11/2019   Procedure: WOUND VAC CHANGE TO RIGHT LEG;  Surgeon: Roby Lofts, MD;  Location: [facility] OR;  Service: Orthopedics;  Laterality: Right;   CORONARY ANGIOPLASTY  10/23/2019   DES RCA, DES RPDA 10/23/19 [facility])   EYE SURGERY     HARDWARE REMOVAL Right 10/06/2019   Procedure: HARDWARE REMOVAL;  Surgeon: Roby Lofts, MD;  Location: [facility] OR;  Service: Orthopedics;  Laterality: Right;   HARDWARE REMOVAL Right 08/23/2020   Procedure: HARDWARE REMOVAL;  Surgeon: Roby Lofts, MD;  Location: [facility] OR;  Service: Orthopedics;  Laterality: Right;   I & D EXTREMITY Right 10/06/2019   Procedure: IRRIGATION AND DEBRIDEMENT EXTREMITY;  Surgeon: Roby Lofts, MD;  Location: [facility] OR;  Service: Orthopedics;  Laterality: Right;   I & D EXTREMITY Right 10/09/2019   Procedure: IRRIGATION AND DEBRIDEMENT EXTREMITY and WOUND VAC CHANGE RIGHT ANKLE;  Surgeon: Roby Lofts, MD;  Location: [facility] OR;  Service: Orthopedics;  Laterality: Right;   ORIF ANKLE FRACTURE Right 07/26/2019   Procedure: OPEN REDUCTION INTERNAL FIXATION (ORIF) ANKLE FRACTURE;  Surgeon: Roby Lofts, MD;  Location: [facility] OR;  Service: Orthopedics;  Laterality: Right;  OPEN REDUCTION INTERNAL FIXATION (ORIF) ANKLE FRACTURE    TOE AMPUTATION      Family History family history includes Congestive Heart Failure in her father and maternal grandfather; Diabetes in her sister; Diabetes Mellitus II in her mother; Heart attack in her mother; Stomach cancer in her father; Stroke in her mother.  Social History Social History   Socioeconomic History   Marital status: Divorced    Spouse name: Not on file   Number of children: Not on file   Years of education: Not on file   Highest education level: Not on file  Occupational History   Not on file  Tobacco Use   Smoking status: Never   Smokeless tobacco: Never  Vaping Use   Vaping status: Never Used  Substance and  Sexual Activity   Alcohol use: Not Currently   Drug use: Not Currently   Sexual activity: Not on file    Comment: Hysterectomy  Other Topics Concern   Not on file  Social History Narrative   Not on file   Social Determinants of Health   Financial Resource Strain: Not on file  Food Insecurity: No Food Insecurity (08/10/2023)   Hunger Vital Sign    Worried About Running Out of Food in the Last Year: Never true    Ran Out of Food in the Last Year: Never true  Transportation Needs: No Transportation Needs (08/10/2023)   PRAPARE - Administrator, Civil Service (Medical): No    Lack of Transportation (Non-Medical): No  Physical Activity: Not on file  Stress: Not on file  Social Connections: Not on file  Intimate Partner Violence: Not At Risk (08/10/2023)   Humiliation, Afraid, Rape, and Kick questionnaire    Fear of Current or Ex-Partner: No    Emotionally Abused: No    Physically Abused: No    Sexually Abused: No    Lab Results  Component Value Date   HGBA1C 6.2 (A) 09/22/2023   HGBA1C 6.9 (H) 06/29/2023   HGBA1C 5.9 (A) 03/22/2023   Lab Results  Component Value Date   CHOL 100 08/12/2023   Lab Results  Component Value Date  HDL 33 (L) 08/12/2023   Lab Results  Component Value Date   LDLCALC 33 08/12/2023   Lab Results  Component Value Date   TRIG 168 (H) 08/12/2023   Lab Results  Component Value Date   CHOLHDL 3.0 08/12/2023   Lab Results  Component Value Date   CREATININE 1.84 (H) 08/18/2023   Lab Results  Component Value Date   GFR 27.95 (L) 12/09/2021   Lab Results  Component Value Date   MICROALBUR 5.9 (H) 02/05/2022      Component Value Date/Time   NA 135 08/18/2023 0633   K 4.6 08/18/2023 0633   CL 100 08/18/2023 0633   CO2 26 08/18/2023 0633   GLUCOSE 380 (H) 08/18/2023 0633   BUN 33 (H) 08/18/2023 0633   CREATININE 1.84 (H) 08/18/2023 0633   CALCIUM 9.1 08/18/2023 0633   PROT 6.4 (L) 08/12/2023 1834   ALBUMIN 3.3 (L)  08/12/2023 1834   AST 17 08/12/2023 1834   ALT 17 08/12/2023 1834   ALKPHOS 122 08/12/2023 1834   BILITOT 0.5 08/12/2023 1834   GFRNONAA 33 (L) 08/18/2023 0633   GFRAA 31 (L) 07/12/2020 1356      Latest Ref Rng & Units 08/18/2023    6:33 AM 08/12/2023    6:34 PM 08/09/2023    6:48 PM  BMP  Glucose 70 - 99 mg/dL 161  096  045   BUN 6 - 20 mg/dL 33  49  24   Creatinine 0.44 - 1.00 mg/dL 4.09  8.11  9.14   Sodium 135 - 145 mmol/L 135  139  137   Potassium 3.5 - 5.1 mmol/L 4.6  4.6  4.7   Chloride 98 - 111 mmol/L 100  105  101   CO2 22 - 32 mmol/L 26  26  26    Calcium 8.9 - 10.3 mg/dL 9.1  8.8  8.8        Component Value Date/Time   WBC 4.4 08/09/2023 1848   RBC 4.44 08/09/2023 1848   HGB 12.7 08/09/2023 1848   HCT 39.0 08/09/2023 1848   PLT 171 08/09/2023 1848   MCV 87.8 08/09/2023 1848   MCH 28.6 08/09/2023 1848   MCHC 32.6 08/09/2023 1848   RDW 13.5 08/09/2023 1848   LYMPHSABS 1.0 08/09/2023 1848   MONOABS 0.3 08/09/2023 1848   EOSABS 0.0 08/09/2023 1848   BASOSABS 0.0 08/09/2023 1848     Parts of this note may have been dictated using voice recognition software. There may be variances in spelling and vocabulary which are unintentional. Not all errors are proofread. Please notify the Thereasa Parkin if any discrepancies are noted or if the meaning of any statement is not clear.

## 2023-10-08 ENCOUNTER — Ambulatory Visit (INDEPENDENT_AMBULATORY_CARE_PROVIDER_SITE_OTHER): Payer: Medicare HMO | Admitting: "Endocrinology

## 2023-10-08 ENCOUNTER — Ambulatory Visit: Payer: Medicare HMO | Admitting: Dietician

## 2023-10-08 ENCOUNTER — Encounter: Payer: Self-pay | Admitting: "Endocrinology

## 2023-10-08 VITALS — BP 126/60 | HR 88 | Resp 20 | Ht 62.0 in | Wt 160.6 lb

## 2023-10-08 DIAGNOSIS — E7800 Pure hypercholesterolemia, unspecified: Secondary | ICD-10-CM

## 2023-10-08 DIAGNOSIS — E10649 Type 1 diabetes mellitus with hypoglycemia without coma: Secondary | ICD-10-CM | POA: Diagnosis not present

## 2023-10-08 DIAGNOSIS — Z7985 Long-term (current) use of injectable non-insulin antidiabetic drugs: Secondary | ICD-10-CM

## 2023-10-08 MED ORDER — SEMAGLUTIDE (2 MG/DOSE) 8 MG/3ML ~~LOC~~ SOPN: 2.0000 mg | PEN_INJECTOR | SUBCUTANEOUS | 1 refills | Status: DC

## 2023-10-08 MED ORDER — EMPAGLIFLOZIN 10 MG PO TABS: 10.0000 mg | ORAL_TABLET | Freq: Every day | ORAL | 4 refills | Status: DC

## 2023-10-08 NOTE — Progress Notes (Signed)
Outpatient Endocrinology Note Deanna Zeba, MD  10/08/23   Deanna Landry [birth year] [id number]  Referring Provider: Simone Curia, MD Primary Care Provider: Simone Curia, MD Reason for consultation: Subjective   Assessment & Plan  Diagnoses and all orders for this visit:  Uncontrolled type 1 diabetes mellitus with hypoglycemia without coma (HCC)  Long-term (current) use of injectable non-insulin antidiabetic drugs  Pure hypercholesterolemia  Other orders -     Semaglutide, 2 MG/DOSE, 8 MG/3ML SOPN; Inject 2 mg as directed once a week. -     empagliflozin (JARDIANCE) 10 MG TABS tablet; Take 1 tablet (10 mg total) by mouth daily before breakfast.     Diabetes Type I complicated by MI, stroke, neuropathy  Lab Results  Component Value Date   GFR 27.95 (L) 12/09/2021   Hba1c goal less than 7, current Hba1c is  Lab Results  Component Value Date   HGBA1C 6.2 (A) 09/22/2023   Will recommend the following:  12 units Tresiba daily Ozempic 2 mg weekly Start jardiance 10 mg every day  Novolog 2 units if sugar is more than 200, 4 units if sugar is more than 300  Discussed dangers of hypoglycemia, patient reports being legally blind and counts the clicks for insulin, previously instructed patient and mom for supervised doses by mom, mom agreed  No known contraindications/side effects to any of above medications No history of MEN syndrome/medullary thyroid cancer/pancreatitis or pancreatic cancer in self or family Glucagon discussed and prescribed with refills on 09/09/23 Lives with mother  -Last LD and Tg are as follows: Lab Results  Component Value Date   LDLCALC 33 08/12/2023    Lab Results  Component Value Date   TRIG 168 (H) 08/12/2023   -On atorvastatin 80 mg every day (non fasting sample led to Tg 168) -Follow low fat diet and exercise   -Blood pressure goal <140/90 - Microalbumin/creatinine goal is < 30 -Last MA/Cr is as follows: Lab Results  Component  Value Date   MICROALBUR 5.9 (H) 02/05/2022   -on ACE/ARB losartan 25 mg qd -diet changes including salt restriction -limit eating outside -counseled BP targets per standards of diabetes care -uncontrolled blood pressure can lead to retinopathy, nephropathy and cardiovascular and atherosclerotic heart disease  Reviewed and counseled on: -A1C target -Blood sugar targets -Complications of uncontrolled diabetes  -Checking blood sugar before meals and bedtime and bring log next visit -All medications with mechanism of action and side effects -Hypoglycemia management: rule of 15's, Glucagon Emergency Kit and medical alert ID -low-carb low-fat plate-method diet -At least 20 minutes of physical activity per day -Annual dilated retinal eye exam and foot exam -compliance and follow up needs -follow up as scheduled or earlier if problem gets worse  Call if blood sugar is less than 70 or consistently above 250    Take a 15 gm snack of carbohydrate at bedtime before you go to sleep if your blood sugar is less than 100.    If you are going to fast after midnight for a test or procedure, ask your physician for instructions on how to reduce/decrease your insulin dose.    Call if blood sugar is less than 70 or consistently above 250  -Treating a low sugar by rule of 15  (15 gms of sugar every 15 min until sugar is more than 70) If you feel your sugar is low, test your sugar to be sure If your sugar is low (less than 70), then take 15 grams of  a fast acting Carbohydrate (3-4 glucose tablets or glucose gel or 4 ounces of juice or regular soda) Recheck your sugar 15 min after treating low to make sure it is more than 70 If sugar is still less than 70, treat again with 15 grams of carbohydrate          Don't drive the hour of hypoglycemia  If unconscious/unable to eat or drink by mouth, use glucagon injection or nasal spray baqsimi and call 911. Can repeat again in 15 min if still  unconscious.  Return in about 6 weeks (around 11/22/2023).   I have reviewed current medications, nurse's notes, allergies, vital signs, past medical and surgical history, family medical history, and social history for this encounter. Counseled patient on symptoms, examination findings, lab findings, imaging results, treatment decisions and monitoring and prognosis. The patient understood the recommendations and agrees with the treatment plan. All questions regarding treatment plan were fully answered.  Deanna [place], MD  10/08/23   History of Present Illness Deanna Landry is a 52 y.o. year old female who presents for follow up of Type I diabetes mellitus.  Deanna Landry was first diagnosed in age 45.    Home diabetes regimen: 15 units Tresiba daily Stopped Novolog  Ozempic 1 mg weekly  Liked Trulicity 1.5 mg weekly better, but not covered by insurance per pt   Previous history:   She has been on insulin since her diagnosis when she had significant symptomatic hypoglycemia. Has been on various types of insulin regimens in the past but usually not followed by endocrinologist The last 5 or 6 years she has been on Levemir and NovoLog Previous level of control is variable with A1c only inconsistently below 7  She was on the T-slim pump started on 03/04/21 Settings: Basal rates: 12 AM- 2 AM = 0.60, 2 AM-8 AM = 0.65, 8 AM-12 AM = 0.50 ISF: 1: 60 except from 2-8 AM, mealtime carbohydrate ratio 1:1 Target 120 Active insulin 5 hours  COMPLICATIONS +  MI/Stroke -  retinopathy +  neuropathy +  nephropathy  BLOOD SUGAR DATA  CGM interpretation: At today's visit, we reviewed her CGM downloads. The full report is scanned in the media. Reviewing the CGM trends, BG are low across the day except morning when it is high.   Physical Exam  BP 126/60 (BP Location: Left Arm, Patient Position: Sitting, Cuff Size: Large)   Pulse 88   Resp 20   Ht 5\' 2"  (1.575 m)   Wt 160 lb 9.6 oz (72.8 kg)    LMP  (LMP Unknown) Comment: hysterectomy 2014  SpO2 99%   BMI 29.37 kg/m    Constitutional: well developed, well nourished Head: normocephalic, atraumatic Eyes: sclera anicteric, no redness Neck: supple Lungs: normal respiratory effort Neurology: alert and oriented Skin: dry, no appreciable rashes Musculoskeletal: no appreciable defects Psychiatric: normal mood and affect Diabetic Foot Exam - Simple   No data filed      Current Medications Patient's Medications  New Prescriptions   EMPAGLIFLOZIN (JARDIANCE) 10 MG TABS TABLET    Take 1 tablet (10 mg total) by mouth daily before breakfast.   SEMAGLUTIDE, 2 MG/DOSE, 8 MG/3ML SOPN    Inject 2 mg as directed once a week.  Previous Medications   ALBUTEROL (VENTOLIN HFA) 108 (90 BASE) MCG/ACT INHALER    Inhale 1-2 puffs into the lungs every 6 (six) hours as needed for wheezing or shortness of breath.   AMLODIPINE (NORVASC) 2.5 MG TABLET  Take 2.5 mg by mouth daily.   ARIPIPRAZOLE (ABILIFY) 10 MG TABLET    Take 1 tablet (10 mg total) by mouth daily.   ASPIRIN EC 81 MG TABLET    Take 1 tablet (81 mg total) by mouth at bedtime. Swallow whole.   ATORVASTATIN (LIPITOR) 80 MG TABLET    Take 1 tablet (80 mg total) by mouth at bedtime.   CLOPIDOGREL (PLAVIX) 75 MG TABLET    Take 75 mg by mouth at bedtime.   DOCUSATE SODIUM (COLACE) 100 MG CAPSULE    Take 1 capsule (100 mg total) by mouth 2 (two) times daily.   GLUCAGON (GVOKE HYPOPEN 1-PACK) 1 MG/0.2ML SOAJ    Inject 1 mg into the skin as needed (low blood sugar with impaired consciousness).   INSULIN ASPART (NOVOLOG) 100 UNIT/ML INJECTION    Inject 8 Units into the skin 3 (three) times daily with meals.   INSULIN DEGLUDEC (TRESIBA FLEXTOUCH) 100 UNIT/ML FLEXTOUCH PEN    Inject 14 Units into the skin daily.   LATANOPROST (XALATAN) 0.005 % OPHTHALMIC SOLUTION    Place 1 drop into the left eye at bedtime.   LEVOTHYROXINE (SYNTHROID) 50 MCG TABLET    Take 1 tablet (50 mcg total) by mouth daily  before breakfast.   LORATADINE (CLARITIN) 10 MG TABLET    Take 10 mg by mouth daily as needed for allergies.   LOSARTAN (COZAAR) 25 MG TABLET    Take 25 mg by mouth daily.   PANTOPRAZOLE (PROTONIX) 40 MG TABLET    Take 1 tablet (40 mg total) by mouth at bedtime.   PREGABALIN (LYRICA) 75 MG CAPSULE    Take 1 capsule (75 mg total) by mouth 2 (two) times daily.   RANOLAZINE (RANEXA) 500 MG 12 HR TABLET    Take 1 tablet (500 mg total) by mouth 2 (two) times daily.   SERTRALINE (ZOLOFT) 50 MG TABLET    Take 3 tablets (150 mg total) by mouth at bedtime.   TRAZODONE (DESYREL) 50 MG TABLET    Take 1 tablet (50 mg total) by mouth at bedtime as needed for sleep.  Modified Medications   No medications on file  Discontinued Medications   SEMAGLUTIDE (OZEMPIC, 1 MG/DOSE, [place])    Inject 1 mg into the skin once a week.    Allergies No Known Allergies  Past Medical History Past Medical History:  Diagnosis Date   Acute kidney injury superimposed on CKD (HCC) 03/15/2019   Acute respiratory failure with hypoxia (HCC) 03/15/2019   Adjustment disorder with mixed anxiety and depressed mood 03/11/2019   Anemia    Anxiety    Arthritis    right ankle   Blind    right eye   CHF (congestive heart failure) (HCC)    Closed right pilon fracture, initial encounter 07/26/2019   COPD (chronic obstructive pulmonary disease) (HCC)    Coronary artery disease    Depression    Diabetes mellitus type 2 in obese 07/26/2019   Diabetes mellitus without complication (HCC)    type 1   Diabetic polyneuropathy (HCC) 05/04/2016   Dyslipidemia 01/03/2020   Fever 03/10/2019   GERD (gastroesophageal reflux disease)    History of blood transfusion    HLD (hyperlipidemia)    Hyperglycemia    Hypertension    Hypothyroidism    Ileus, postoperative (HCC)    Major depression, recurrent (HCC) 03/09/2019   MDD (major depressive disorder), severe (HCC) 03/09/2019   Myocardial infarct, old    2020, s/p  DES RCA, RPDA 10/23/19   Neck pain  04/21/2016   Pneumonia 10/2019   Renal disorder    stage 3   Sepsis (HCC) 03/10/2019   Short-term memory loss    mild   Sleep apnea    study done 9/27 and 08/13/20 - no results yet 08/21/20   Stroke (HCC)    Suicidal ideation 03/15/2019   Type 1 diabetes mellitus without complication (HCC) 03/15/2019   Wound infection after surgery (Right ankle) 10/04/2019    Past Surgical History Past Surgical History:  Procedure Laterality Date   ABDOMINAL HYSTERECTOMY  2014   ANKLE FUSION Right 08/23/2020   Procedure: ANKLE FUSION;  Surgeon: Roby Lofts, MD;  Location: [facility] OR;  Service: Orthopedics;  Laterality: Right;   APPLICATION OF WOUND VAC Right 10/11/2019   Procedure: WOUND VAC CHANGE TO RIGHT LEG;  Surgeon: Roby Lofts, MD;  Location: [facility] OR;  Service: Orthopedics;  Laterality: Right;   CORONARY ANGIOPLASTY  10/23/2019   DES RCA, DES RPDA 10/23/19 [facility])   EYE SURGERY     HARDWARE REMOVAL Right 10/06/2019   Procedure: HARDWARE REMOVAL;  Surgeon: Roby Lofts, MD;  Location: [facility] OR;  Service: Orthopedics;  Laterality: Right;   HARDWARE REMOVAL Right 08/23/2020   Procedure: HARDWARE REMOVAL;  Surgeon: Roby Lofts, MD;  Location: [facility] OR;  Service: Orthopedics;  Laterality: Right;   I & D EXTREMITY Right 10/06/2019   Procedure: IRRIGATION AND DEBRIDEMENT EXTREMITY;  Surgeon: Roby Lofts, MD;  Location: [facility] OR;  Service: Orthopedics;  Laterality: Right;   I & D EXTREMITY Right 10/09/2019   Procedure: IRRIGATION AND DEBRIDEMENT EXTREMITY and WOUND VAC CHANGE RIGHT ANKLE;  Surgeon: Roby Lofts, MD;  Location: [facility] OR;  Service: Orthopedics;  Laterality: Right;   ORIF ANKLE FRACTURE Right 07/26/2019   Procedure: OPEN REDUCTION INTERNAL FIXATION (ORIF) ANKLE FRACTURE;  Surgeon: Roby Lofts, MD;  Location: [facility] OR;  Service: Orthopedics;  Laterality: Right;  OPEN REDUCTION INTERNAL FIXATION (ORIF) ANKLE FRACTURE    TOE AMPUTATION      Family History family history includes Congestive  Heart Failure in her father and maternal grandfather; Diabetes in her sister; Diabetes Mellitus II in her mother; Heart attack in her mother; Stomach cancer in her father; Stroke in her mother.  Social History Social History   Socioeconomic History   Marital status: Divorced    Spouse name: Not on file   Number of children: Not on file   Years of education: Not on file   Highest education level: Not on file  Occupational History   Not on file  Tobacco Use   Smoking status: Never   Smokeless tobacco: Never  Vaping Use   Vaping status: Never Used  Substance and Sexual Activity   Alcohol use: Not Currently   Drug use: Not Currently   Sexual activity: Not on file    Comment: Hysterectomy  Other Topics Concern   Not on file  Social History Narrative   Not on file   Social Determinants of Health   Financial Resource Strain: Not on file  Food Insecurity: No Food Insecurity (08/10/2023)   Hunger Vital Sign    Worried About Running Out of Food in the Last Year: Never true    Ran Out of Food in the Last Year: Never true  Transportation Needs: No Transportation Needs (08/10/2023)   PRAPARE - Administrator, Civil Service (Medical): No    Lack of Transportation (Non-Medical): No  Physical Activity: Not on file  Stress: Not on file  Social Connections: Not on file  Intimate Partner Violence: Not At Risk (08/10/2023)   Humiliation, Afraid, Rape, and Kick questionnaire    Fear of Current or Ex-Partner: No    Emotionally Abused: No    Physically Abused: No    Sexually Abused: No    Lab Results  Component Value Date   HGBA1C 6.2 (A) 09/22/2023   HGBA1C 6.9 (H) 06/29/2023   HGBA1C 5.9 (A) 03/22/2023   Lab Results  Component Value Date   CHOL 100 08/12/2023   Lab Results  Component Value Date   HDL 33 (L) 08/12/2023   Lab Results  Component Value Date   LDLCALC 33 08/12/2023   Lab Results  Component Value Date   TRIG 168 (H) 08/12/2023   Lab Results   Component Value Date   CHOLHDL 3.0 08/12/2023   Lab Results  Component Value Date   CREATININE 1.84 (H) 08/18/2023   Lab Results  Component Value Date   GFR 27.95 (L) 12/09/2021   Lab Results  Component Value Date   MICROALBUR 5.9 (H) 02/05/2022      Component Value Date/Time   NA 135 08/18/2023 0633   K 4.6 08/18/2023 0633   CL 100 08/18/2023 0633   CO2 26 08/18/2023 0633   GLUCOSE 380 (H) 08/18/2023 0633   BUN 33 (H) 08/18/2023 0633   CREATININE 1.84 (H) 08/18/2023 0633   CALCIUM 9.1 08/18/2023 0633   PROT 6.4 (L) 08/12/2023 1834   ALBUMIN 3.3 (L) 08/12/2023 1834   AST 17 08/12/2023 1834   ALT 17 08/12/2023 1834   ALKPHOS 122 08/12/2023 1834   BILITOT 0.5 08/12/2023 1834   GFRNONAA 33 (L) 08/18/2023 0633   GFRAA 31 (L) 07/12/2020 1356      Latest Ref Rng & Units 08/18/2023    6:33 AM 08/12/2023    6:34 PM 08/09/2023    6:48 PM  BMP  Glucose 70 - 99 mg/dL 161  096  045   BUN 6 - 20 mg/dL 33  49  24   Creatinine 0.44 - 1.00 mg/dL 4.09  8.11  9.14   Sodium 135 - 145 mmol/L 135  139  137   Potassium 3.5 - 5.1 mmol/L 4.6  4.6  4.7   Chloride 98 - 111 mmol/L 100  105  101   CO2 22 - 32 mmol/L 26  26  26    Calcium 8.9 - 10.3 mg/dL 9.1  8.8  8.8        Component Value Date/Time   WBC 4.4 08/09/2023 1848   RBC 4.44 08/09/2023 1848   HGB 12.7 08/09/2023 1848   HCT 39.0 08/09/2023 1848   PLT 171 08/09/2023 1848   MCV 87.8 08/09/2023 1848   MCH 28.6 08/09/2023 1848   MCHC 32.6 08/09/2023 1848   RDW 13.5 08/09/2023 1848   LYMPHSABS 1.0 08/09/2023 1848   MONOABS 0.3 08/09/2023 1848   EOSABS 0.0 08/09/2023 1848   BASOSABS 0.0 08/09/2023 1848     Parts of this note may have been dictated using voice recognition software. There may be variances in spelling and vocabulary which are unintentional. Not all errors are proofread. Please notify the Thereasa Parkin if any discrepancies are noted or if the meaning of any statement is not clear.

## 2023-10-08 NOTE — Patient Instructions (Addendum)
12 units Tresiba daily Ozempic 2 mg weekly Start jardiance 10 mg qd Novolog 2 units if sugar is more than 200, 4 units if sugar is more than 300

## 2023-10-11 ENCOUNTER — Encounter: Payer: Self-pay | Admitting: "Endocrinology

## 2023-11-23 ENCOUNTER — Ambulatory Visit: Payer: Medicare HMO | Admitting: "Endocrinology

## 2023-11-29 ENCOUNTER — Ambulatory Visit: Payer: Medicare HMO | Admitting: "Endocrinology

## 2023-12-08 ENCOUNTER — Ambulatory Visit: Payer: Medicare HMO | Admitting: "Endocrinology

## 2023-12-10 ENCOUNTER — Other Ambulatory Visit: Payer: Self-pay | Admitting: "Endocrinology

## 2023-12-22 ENCOUNTER — Encounter: Payer: Self-pay | Admitting: "Endocrinology

## 2023-12-22 ENCOUNTER — Ambulatory Visit (INDEPENDENT_AMBULATORY_CARE_PROVIDER_SITE_OTHER): Payer: Medicare HMO | Admitting: "Endocrinology

## 2023-12-22 VITALS — BP 112/60 | HR 97 | Resp 20 | Ht 62.0 in | Wt 157.8 lb

## 2023-12-22 DIAGNOSIS — E10649 Type 1 diabetes mellitus with hypoglycemia without coma: Secondary | ICD-10-CM

## 2023-12-22 DIAGNOSIS — E7800 Pure hypercholesterolemia, unspecified: Secondary | ICD-10-CM

## 2023-12-22 DIAGNOSIS — Z7984 Long term (current) use of oral hypoglycemic drugs: Secondary | ICD-10-CM

## 2023-12-22 DIAGNOSIS — Z794 Long term (current) use of insulin: Secondary | ICD-10-CM

## 2023-12-22 DIAGNOSIS — Z7985 Long-term (current) use of injectable non-insulin antidiabetic drugs: Secondary | ICD-10-CM

## 2023-12-22 LAB — POCT GLYCOSYLATED HEMOGLOBIN (HGB A1C): Hemoglobin A1C: 7.2 % — AB (ref 4.0–5.6)

## 2023-12-22 MED ORDER — EMPAGLIFLOZIN 25 MG PO TABS: 25.0000 mg | ORAL_TABLET | Freq: Every day | ORAL | 1 refills | Status: DC

## 2023-12-22 NOTE — Patient Instructions (Addendum)
 Will recommend the following:  12 units Tresiba  daily Ozempic  2 mg weekly Jardiance  25 mg every day  Novolog  2 units before midnight snack, break fast and supper 15 min before meals (skip it if blood sugar is less than 100)

## 2023-12-22 NOTE — Progress Notes (Signed)
 Outpatient Endocrinology Note Deanna Birmingham, MD  12/22/23   Deanna Landry 1971/04/12 [id number]  Referring Provider: [name], MD Primary Care Provider: [name], MD Reason for consultation: Subjective   Assessment & Plan  Diagnoses and all orders for this visit:  Uncontrolled type 1 diabetes mellitus with hypoglycemia without coma (HCC) -     POCT glycosylated hemoglobin (Hb A1C) -     C-peptide -     Basic metabolic panel  Long-term (current) use of injectable non-insulin  antidiabetic drugs  Long term (current) use of oral hypoglycemic drugs  Long-term insulin  use (HCC)  Pure hypercholesterolemia  Other orders -     empagliflozin  (JARDIANCE ) 25 MG TABS tablet; Take 1 tablet (25 mg total) by mouth daily before breakfast.    Diabetes Type I complicated by MI, stroke, neuropathy  Lab Results  Component Value Date   GFR 27.95 (L) 12/09/2021   Hba1c goal less than 7, current Hba1c is  Lab Results  Component Value Date   HGBA1C 7.2 (A) 12/22/2023   Will recommend the following:  12 units Tresiba  daily Ozempic  2 mg weekly Jardiance  25 mg every day  Novolog  2 units before midnight snack, break fast and supper: 15 min before meals (skip it if blood sugar is less than 100)  Discussed dangers of hypoglycemia, patient reports being legally blind and counts the clicks for insulin , previously instructed patient and mom for supervised doses by mom, mom agreed  No known contraindications/side effects to any of above medications No history of MEN syndrome/medullary thyroid  cancer/pancreatitis or pancreatic cancer in self or family Glucagon  discussed and prescribed with refills on 09/09/23 Lives with mother  -Last LD and Tg are as follows: Lab Results  Component Value Date   LDLCALC 33 08/12/2023    Lab Results  Component Value Date   TRIG 168 (H) 08/12/2023   -On atorvastatin  80 mg every day (non fasting sample led to Tg 168) -Follow low fat diet and  exercise   -Blood pressure goal <140/90 - Microalbumin/creatinine goal is < 30 -Last MA/Cr is as follows: Lab Results  Component Value Date   MICROALBUR 5.9 (H) 02/05/2022   -on ACE/ARB losartan  25 mg qd -diet changes including salt restriction -limit eating outside -counseled BP targets per standards of diabetes care -uncontrolled blood pressure can lead to retinopathy, nephropathy and cardiovascular and atherosclerotic heart disease  Reviewed and counseled on: -A1C target -Blood sugar targets -Complications of uncontrolled diabetes  -Checking blood sugar before meals and bedtime and bring log next visit -All medications with mechanism of action and side effects -Hypoglycemia management: rule of 15's, Glucagon  Emergency Kit and medical alert ID -low-carb low-fat plate-method diet -At least 20 minutes of physical activity per day -Annual dilated retinal eye exam and foot exam -compliance and follow up needs -follow up as scheduled or earlier if problem gets worse  Call if blood sugar is less than 70 or consistently above 250    Take a 15 gm snack of carbohydrate at bedtime before you go to sleep if your blood sugar is less than 100.    If you are going to fast after midnight for a test or procedure, ask your physician for instructions on how to reduce/decrease your insulin  dose.    Call if blood sugar is less than 70 or consistently above 250  -Treating a low sugar by rule of 15  (15 gms of sugar every 15 min until sugar is more than 70) If you feel  your sugar is low, test your sugar to be sure If your sugar is low (less than 70), then take 15 grams of a fast acting Carbohydrate (3-4 glucose tablets or glucose gel or 4 ounces of juice or regular soda) Recheck your sugar 15 min after treating low to make sure it is more than 70 If sugar is still less than 70, treat again with 15 grams of carbohydrate          Don't drive the hour of hypoglycemia  If unconscious/unable to eat  or drink by mouth, use glucagon  injection or nasal spray baqsimi and call 911. Can repeat again in 15 min if still unconscious.  Return in about 6 weeks (around 02/02/2024).   I have reviewed current medications, nurse's notes, allergies, vital signs, past medical and surgical history, family medical history, and social history for this encounter. Counseled patient on symptoms, examination findings, lab findings, imaging results, treatment decisions and monitoring and prognosis. The patient understood the recommendations and agrees with the treatment plan. All questions regarding treatment plan were fully answered.  Deanna Birmingham, MD  12/22/23   History of Present Illness Deanna Landry is a 53 y.o. year old female who presents for follow up of Type I diabetes mellitus.  Deanna Landry was first diagnosed in age 6.    Home diabetes regimen: 12 units Tresiba  daily Jardiance  10 mg every day  Ozempic  2 mg weekly Novolog  2 units if sugar is more than 200, 4 units if sugar is more than 300  Liked Trulicity  1.5 mg weekly better, but not covered by insurance per pt   Previous history:   She has been on insulin  since her diagnosis when she had significant symptomatic hypoglycemia. Has been on various types of insulin  regimens in the past but usually not followed by endocrinologist The last 5 or 6 years she has been on Levemir  and NovoLog  Previous level of control is variable with A1c only inconsistently below 7  She was on the T-slim pump started on 03/04/21 Settings: Basal rates: 12 AM- 2 AM = 0.60, 2 AM-8 AM = 0.65, 8 AM-12 AM = 0.50 ISF: 1: 60 except from 2-8 AM, mealtime carbohydrate ratio 1:1 Target 120 Active insulin  5 hours  COMPLICATIONS +  MI/Stroke -  retinopathy +  neuropathy +  nephropathy  BLOOD SUGAR DATA  CGM interpretation: At today's visit, we reviewed her CGM downloads. The full report is scanned in the media. Reviewing the CGM trends, BG are elevated after midnight  snack.   Physical Exam  BP 112/60 (BP Location: Left Arm, Patient Position: Sitting, Cuff Size: Large)   Pulse 97   Resp 20   Ht 5' 2 (1.575 m)   Wt 157 lb 12.8 oz (71.6 kg)   LMP  (LMP Unknown) Comment: hysterectomy 2014  SpO2 97%   BMI 28.86 kg/m    Constitutional: well developed, well nourished Head: normocephalic, atraumatic Eyes: sclera anicteric, no redness Neck: supple Lungs: normal respiratory effort Neurology: alert and oriented Skin: dry, no appreciable rashes Musculoskeletal: no appreciable defects Psychiatric: normal mood and affect Diabetic Foot Exam - Simple   No data filed      Current Medications Patient's Medications  New Prescriptions   EMPAGLIFLOZIN  (JARDIANCE ) 25 MG TABS TABLET    Take 1 tablet (25 mg total) by mouth daily before breakfast.  Previous Medications   ALBUTEROL  (VENTOLIN  HFA) 108 (90 BASE) MCG/ACT INHALER    Inhale 1-2 puffs into the lungs every 6 (six)  hours as needed for wheezing or shortness of breath.   AMLODIPINE  (NORVASC ) 2.5 MG TABLET    Take 2.5 mg by mouth daily.   ARIPIPRAZOLE  (ABILIFY ) 10 MG TABLET    Take 1 tablet (10 mg total) by mouth daily.   ASPIRIN  EC 81 MG TABLET    Take 1 tablet (81 mg total) by mouth at bedtime. Swallow whole.   ATORVASTATIN  (LIPITOR ) 80 MG TABLET    Take 1 tablet (80 mg total) by mouth at bedtime.   CLOPIDOGREL  (PLAVIX ) 75 MG TABLET    Take 75 mg by mouth at bedtime.   CONTINUOUS GLUCOSE SENSOR (FREESTYLE LIBRE 3 SENSOR) MISC    by Does not apply route.   DOCUSATE SODIUM  (COLACE) 100 MG CAPSULE    Take 1 capsule (100 mg total) by mouth 2 (two) times daily.   GLUCAGON  (GVOKE HYPOPEN  1-PACK) 1 MG/0.2ML SOAJ    Inject 1 mg into the skin as needed (low blood sugar with impaired consciousness).   INSULIN  ASPART (NOVOLOG ) 100 UNIT/ML INJECTION    Inject 8 Units into the skin 3 (three) times daily with meals.   LATANOPROST  (XALATAN ) 0.005 % OPHTHALMIC SOLUTION    Place 1 drop into the left eye at bedtime.    LEVOTHYROXINE  (SYNTHROID ) 50 MCG TABLET    Take 1 tablet (50 mcg total) by mouth daily before breakfast.   LORATADINE  (CLARITIN ) 10 MG TABLET    Take 10 mg by mouth daily as needed for allergies.   LOSARTAN  (COZAAR ) 25 MG TABLET    Take 25 mg by mouth daily.   OZEMPIC , 2 MG/DOSE, 8 MG/3ML SOPN    INJECT 2 MG AS DIRECTED ONCE A WEEK.   PANTOPRAZOLE  (PROTONIX ) 40 MG TABLET    Take 1 tablet (40 mg total) by mouth at bedtime.   PREGABALIN  (LYRICA ) 75 MG CAPSULE    Take 1 capsule (75 mg total) by mouth 2 (two) times daily.   RANOLAZINE  (RANEXA ) 500 MG 12 HR TABLET    Take 1 tablet (500 mg total) by mouth 2 (two) times daily.   SERTRALINE  (ZOLOFT ) 50 MG TABLET    Take 3 tablets (150 mg total) by mouth at bedtime.   TRAZODONE  (DESYREL ) 50 MG TABLET    Take 1 tablet (50 mg total) by mouth at bedtime as needed for sleep.  Modified Medications   No medications on file  Discontinued Medications   EMPAGLIFLOZIN  (JARDIANCE ) 10 MG TABS TABLET    Take 1 tablet (10 mg total) by mouth daily before breakfast.    Allergies No Known Allergies  Past Medical History Past Medical History:  Diagnosis Date   Acute kidney injury superimposed on CKD (HCC) 03/15/2019   Acute respiratory failure with hypoxia (HCC) 03/15/2019   Adjustment disorder with mixed anxiety and depressed mood 03/11/2019   Anemia    Anxiety    Arthritis    right ankle   Blind    right eye   CHF (congestive heart failure) (HCC)    Closed right pilon fracture, initial encounter 07/26/2019   COPD (chronic obstructive pulmonary disease) (HCC)    Coronary artery disease    Depression    Diabetes mellitus type 2 in obese 07/26/2019   Diabetes mellitus without complication (HCC)    type 1   Diabetic polyneuropathy (HCC) 05/04/2016   Dyslipidemia 01/03/2020   Fever 03/10/2019   GERD (gastroesophageal reflux disease)    History of blood transfusion    HLD (hyperlipidemia)    Hyperglycemia  Hypertension    Hypothyroidism    Ileus,  postoperative (HCC)    Major depression, recurrent (HCC) 03/09/2019   MDD (major depressive disorder), severe (HCC) 03/09/2019   Myocardial infarct, old    2020, s/p DES RCA, RPDA 10/23/19   Neck pain 04/21/2016   Pneumonia 10/2019   Renal disorder    stage 3   Sepsis (HCC) 03/10/2019   Short-term memory loss    mild   Sleep apnea    study done 9/27 and 08/13/20 - no results yet 08/21/20   Stroke (HCC)    Suicidal ideation 03/15/2019   Type 1 diabetes mellitus without complication (HCC) 03/15/2019   Wound infection after surgery (Right ankle) 10/04/2019    Past Surgical History Past Surgical History:  Procedure Laterality Date   ABDOMINAL HYSTERECTOMY  2014   ANKLE FUSION Right 08/23/2020   Procedure: ANKLE FUSION;  Surgeon: Kendal Franky SQUIBB, MD;  Location: [facility] OR;  Service: Orthopedics;  Laterality: Right;   APPLICATION OF WOUND VAC Right 10/11/2019   Procedure: WOUND VAC CHANGE TO RIGHT LEG;  Surgeon: Kendal Franky SQUIBB, MD;  Location: [facility] OR;  Service: Orthopedics;  Laterality: Right;   CORONARY ANGIOPLASTY  10/23/2019   DES RCA, DES RPDA 10/23/19 [facility])   EYE SURGERY     HARDWARE REMOVAL Right 10/06/2019   Procedure: HARDWARE REMOVAL;  Surgeon: Kendal Franky SQUIBB, MD;  Location: [facility] OR;  Service: Orthopedics;  Laterality: Right;   HARDWARE REMOVAL Right 08/23/2020   Procedure: HARDWARE REMOVAL;  Surgeon: Kendal Franky SQUIBB, MD;  Location: [facility] OR;  Service: Orthopedics;  Laterality: Right;   I & D EXTREMITY Right 10/06/2019   Procedure: IRRIGATION AND DEBRIDEMENT EXTREMITY;  Surgeon: Kendal Franky SQUIBB, MD;  Location: [facility] OR;  Service: Orthopedics;  Laterality: Right;   I & D EXTREMITY Right 10/09/2019   Procedure: IRRIGATION AND DEBRIDEMENT EXTREMITY and WOUND VAC CHANGE RIGHT ANKLE;  Surgeon: Kendal Franky SQUIBB, MD;  Location: [facility];  Service: Orthopedics;  Laterality: Right;   ORIF ANKLE FRACTURE Right 07/26/2019   Procedure: OPEN REDUCTION INTERNAL FIXATION (ORIF) ANKLE FRACTURE;  Surgeon: Kendal Franky SQUIBB,  MD;  Location: [facility] OR;  Service: Orthopedics;  Laterality: Right;  OPEN REDUCTION INTERNAL FIXATION (ORIF) ANKLE FRACTURE    TOE AMPUTATION      Family History family history includes Congestive Heart Failure in her father and maternal grandfather; Diabetes in her sister; Diabetes Mellitus II in her mother; Heart attack in her mother; Stomach cancer in her father; Stroke in her mother.  Social History Social History   Socioeconomic History   Marital status: Divorced    Spouse name: Not on file   Number of children: Not on file   Years of education: Not on file   Highest education level: Not on file  Occupational History   Not on file  Tobacco Use   Smoking status: Never   Smokeless tobacco: Never  Vaping Use   Vaping status: Never Used  Substance and Sexual Activity   Alcohol  use: Not Currently   Drug use: Not Currently   Sexual activity: Not on file    Comment: Hysterectomy  Other Topics Concern   Not on file  Social History Narrative   Not on file   Social Drivers of Health   Financial Resource Strain: Not on file  Food Insecurity: No Food Insecurity (08/10/2023)   Hunger Vital Sign    Worried About Running Out of Food in the Last Year: Never true    Ran  Out of Food in the Last Year: Never true  Transportation Needs: No Transportation Needs (08/10/2023)   PRAPARE - Administrator, Civil Service (Medical): No    Lack of Transportation (Non-Medical): No  Physical Activity: Not on file  Stress: Not on file  Social Connections: Not on file  Intimate Partner Violence: Not At Risk (08/10/2023)   Humiliation, Afraid, Rape, and Kick questionnaire    Fear of Current or Ex-Partner: No    Emotionally Abused: No    Physically Abused: No    Sexually Abused: No    Lab Results  Component Value Date   HGBA1C 7.2 (A) 12/22/2023   HGBA1C 6.2 (A) 09/22/2023   HGBA1C 6.9 (H) 06/29/2023   Lab Results  Component Value Date   CHOL 100 08/12/2023   Lab Results   Component Value Date   HDL 33 (L) 08/12/2023   Lab Results  Component Value Date   LDLCALC 33 08/12/2023   Lab Results  Component Value Date   TRIG 168 (H) 08/12/2023   Lab Results  Component Value Date   CHOLHDL 3.0 08/12/2023   Lab Results  Component Value Date   CREATININE 1.84 (H) 08/18/2023   Lab Results  Component Value Date   GFR 27.95 (L) 12/09/2021   Lab Results  Component Value Date   MICROALBUR 5.9 (H) 02/05/2022      Component Value Date/Time   NA 135 08/18/2023 0633   K 4.6 08/18/2023 0633   CL 100 08/18/2023 0633   CO2 26 08/18/2023 0633   GLUCOSE 380 (H) 08/18/2023 0633   BUN 33 (H) 08/18/2023 0633   CREATININE 1.84 (H) 08/18/2023 0633   CALCIUM  9.1 08/18/2023 0633   PROT 6.4 (L) 08/12/2023 1834   ALBUMIN 3.3 (L) 08/12/2023 1834   AST 17 08/12/2023 1834   ALT 17 08/12/2023 1834   ALKPHOS 122 08/12/2023 1834   BILITOT 0.5 08/12/2023 1834   GFRNONAA 33 (L) 08/18/2023 0633   GFRAA 31 (L) 07/12/2020 1356      Latest Ref Rng & Units 08/18/2023    6:33 AM 08/12/2023    6:34 PM 08/09/2023    6:48 PM  BMP  Glucose 70 - 99 mg/dL 619  785  729   BUN 6 - 20 mg/dL 33  49  24   Creatinine 0.44 - 1.00 mg/dL 8.15  7.16  7.92   Sodium 135 - 145 mmol/L 135  139  137   Potassium 3.5 - 5.1 mmol/L 4.6  4.6  4.7   Chloride 98 - 111 mmol/L 100  105  101   CO2 22 - 32 mmol/L 26  26  26    Calcium  8.9 - 10.3 mg/dL 9.1  8.8  8.8        Component Value Date/Time   WBC 4.4 08/09/2023 1848   RBC 4.44 08/09/2023 1848   HGB 12.7 08/09/2023 1848   HCT 39.0 08/09/2023 1848   PLT 171 08/09/2023 1848   MCV 87.8 08/09/2023 1848   MCH 28.6 08/09/2023 1848   MCHC 32.6 08/09/2023 1848   RDW 13.5 08/09/2023 1848   LYMPHSABS 1.0 08/09/2023 1848   MONOABS 0.3 08/09/2023 1848   EOSABS 0.0 08/09/2023 1848   BASOSABS 0.0 08/09/2023 1848     Parts of this note may have been dictated using voice recognition software. There may be variances in spelling and vocabulary  which are unintentional. Not all errors are proofread. Please notify the dino if any discrepancies are noted  or if the meaning of any statement is not clear.

## 2023-12-23 LAB — BASIC METABOLIC PANEL
BUN/Creatinine Ratio: 14 (calc) (ref 6–22)
BUN: 26 mg/dL — ABNORMAL HIGH (ref 7–25)
CO2: 27 mmol/L (ref 20–32)
Calcium: 9.9 mg/dL (ref 8.6–10.4)
Chloride: 102 mmol/L (ref 98–110)
Creat: 1.92 mg/dL — ABNORMAL HIGH (ref 0.50–1.03)
Glucose, Bld: 247 mg/dL — ABNORMAL HIGH (ref 65–99)
Potassium: 4.8 mmol/L (ref 3.5–5.3)
Sodium: 141 mmol/L (ref 135–146)

## 2023-12-23 LAB — C-PEPTIDE: C-Peptide: 1.25 ng/mL (ref 0.80–3.85)

## 2024-02-02 ENCOUNTER — Ambulatory Visit: Payer: Medicare HMO | Admitting: "Endocrinology

## 2024-02-15 ENCOUNTER — Encounter: Payer: Self-pay | Admitting: "Endocrinology

## 2024-02-15 ENCOUNTER — Ambulatory Visit (INDEPENDENT_AMBULATORY_CARE_PROVIDER_SITE_OTHER): Admitting: "Endocrinology

## 2024-02-15 VITALS — BP 120/78 | HR 74 | Ht 62.0 in | Wt 160.0 lb

## 2024-02-15 DIAGNOSIS — Z7984 Long term (current) use of oral hypoglycemic drugs: Secondary | ICD-10-CM

## 2024-02-15 DIAGNOSIS — Z7985 Long-term (current) use of injectable non-insulin antidiabetic drugs: Secondary | ICD-10-CM | POA: Diagnosis not present

## 2024-02-15 DIAGNOSIS — E7800 Pure hypercholesterolemia, unspecified: Secondary | ICD-10-CM

## 2024-02-15 DIAGNOSIS — Z794 Long term (current) use of insulin: Secondary | ICD-10-CM | POA: Diagnosis not present

## 2024-02-15 DIAGNOSIS — E11649 Type 2 diabetes mellitus with hypoglycemia without coma: Secondary | ICD-10-CM | POA: Diagnosis not present

## 2024-02-15 MED ORDER — TRESIBA FLEXTOUCH 100 UNIT/ML ~~LOC~~ SOPN
8.0000 [IU] | PEN_INJECTOR | Freq: Every day | SUBCUTANEOUS | 1 refills | Status: DC
Start: 2024-02-15 — End: 2024-04-21

## 2024-02-15 MED ORDER — PEN NEEDLES 32G X 4 MM MISC: 1.0000 | Freq: Four times a day (QID) | 2 refills | Status: DC

## 2024-02-15 NOTE — Patient Instructions (Signed)

## 2024-02-15 NOTE — Progress Notes (Signed)
 Outpatient Endocrinology Note Deanna [place], MD  02/15/24   Deanna Landry 11-07-1971 [id number]  Referring Provider: Simone Curia, MD Primary Care Provider: Simone Curia, MD Reason for consultation: Subjective   Assessment & Plan  Diagnoses and all orders for this visit:  Uncontrolled type 2 diabetes mellitus with hypoglycemia without coma (HCC)  Long-term (current) use of injectable non-insulin antidiabetic drugs  Long term (current) use of oral hypoglycemic drugs  Long-term insulin use (HCC)  Pure hypercholesterolemia  Other orders -     TRESIBA FLEXTOUCH 100 UNIT/ML FlexTouch Pen; Inject 8-10 Units into the skin daily. -     Insulin Pen Needle (PEN NEEDLES) 32G X 4 MM MISC; 1 Device by Does not apply route in the morning, at noon, in the evening, and at bedtime.   Diabetes Type I complicated by MI, stroke, neuropathy  Lab Results  Component Value Date   GFR 27.95 (L) 12/09/2021   Hba1c goal less than 7, current Hba1c is  Lab Results  Component Value Date   HGBA1C 7.2 (A) 12/22/2023   Will recommend the following:  8-10 units Tresiba daily Ozempic 2 mg weekly Jardiance 25 mg every day  Novolog 2 units before midnight snack, break fast and supper: 15 min before meals if BG is OVER 200  Discussed dangers of hypoglycemia, patient reports being legally blind and counts the clicks for insulin, previously instructed patient and mom for supervised doses by mom, mom agreed  No known contraindications/side effects to any of above medications No history of MEN syndrome/medullary thyroid cancer/pancreatitis or pancreatic cancer in self or family Glucagon discussed and prescribed with refills on 09/09/23 Lives with mother  -Last LD and Tg are as follows: Lab Results  Component Value Date   LDLCALC 33 08/12/2023    Lab Results  Component Value Date   TRIG 168 (H) 08/12/2023   -On atorvastatin 80 mg every day (non fasting sample led to Tg 168) -Follow low fat diet  and exercise   -Blood pressure goal <140/90 - Microalbumin/creatinine goal is < 30 -Last MA/Cr is as follows: Lab Results  Component Value Date   MICROALBUR 5.9 (H) 02/05/2022   -on ACE/ARB losartan 25 mg qd -diet changes including salt restriction -limit eating outside -counseled BP targets per standards of diabetes care -uncontrolled blood pressure can lead to retinopathy, nephropathy and cardiovascular and atherosclerotic heart disease  Reviewed and counseled on: -A1C target -Blood sugar targets -Complications of uncontrolled diabetes  -Checking blood sugar before meals and bedtime and bring log next visit -All medications with mechanism of action and side effects -Hypoglycemia management: rule of 15's, Glucagon Emergency Kit and medical alert ID -low-carb low-fat plate-method diet -At least 20 minutes of physical activity per day -Annual dilated retinal eye exam and foot exam -compliance and follow up needs -follow up as scheduled or earlier if problem gets worse  Call if blood sugar is less than 70 or consistently above 250    Take a 15 gm snack of carbohydrate at bedtime before you go to sleep if your blood sugar is less than 100.    If you are going to fast after midnight for a test or procedure, ask your physician for instructions on how to reduce/decrease your insulin dose.    Call if blood sugar is less than 70 or consistently above 250  -Treating a low sugar by rule of 15  (15 gms of sugar every 15 min until sugar is more than 70) If you feel  your sugar is low, test your sugar to be sure If your sugar is low (less than 70), then take 15 grams of a fast acting Carbohydrate (3-4 glucose tablets or glucose gel or 4 ounces of juice or regular soda) Recheck your sugar 15 min after treating low to make sure it is more than 70 If sugar is still less than 70, treat again with 15 grams of carbohydrate          Don't drive the hour of hypoglycemia  If unconscious/unable to  eat or drink by mouth, use glucagon injection or nasal spray baqsimi and call 911. Can repeat again in 15 min if still unconscious.  Return in about 6 weeks (around 03/28/2024).   I have reviewed current medications, nurse's notes, allergies, vital signs, past medical and surgical history, family medical history, and social history for this encounter. Counseled patient on symptoms, examination findings, lab findings, imaging results, treatment decisions and monitoring and prognosis. The patient understood the recommendations and agrees with the treatment plan. All questions regarding treatment plan were fully answered.  Deanna Arpin, MD  02/15/24   History of Present Illness Deanna Landry is a 53 y.o. year old female who presents for follow up of Type I diabetes mellitus.  Deanna Landry was first diagnosed in age 53.    Home diabetes regimen: 12 units Tresiba daily Jardiance 25 mg every day  Ozempic 2 mg weekly Novolog 2 units if sugar is more than 200, 4 units if sugar is more than 300  Liked Trulicity 1.5 mg weekly better, but not covered by insurance per pt   Previous history:   She has been on insulin since her diagnosis when she had significant symptomatic hypoglycemia. Has been on various types of insulin regimens in the past but usually not followed by endocrinologist The last 5 or 6 years she has been on Levemir and NovoLog Previous level of control is variable with A1c only inconsistently below 7  She was on the T-slim pump started on 03/04/21 Settings: Basal rates: 12 AM- 2 AM = 0.60, 2 AM-8 AM = 0.65, 8 AM-12 AM = 0.50 ISF: 1: 60 except from 2-8 AM, mealtime carbohydrate ratio 1:1 Target 120 Active insulin 5 hours  COMPLICATIONS +  MI/Stroke -  retinopathy +  neuropathy +  nephropathy  BLOOD SUGAR DATA  CGM interpretation: At today's visit, we reviewed her CGM downloads. The full report is scanned in the media. Reviewing the CGM trends, BG are low across the day,  with some afternoon highs    Physical Exam  BP 120/78   Pulse 74   Ht 5\' 2"  (1.575 m)   Wt 160 lb (72.6 kg)   LMP  (LMP Unknown) Comment: hysterectomy 2014  SpO2 98%   BMI 29.26 kg/m    Constitutional: well developed, well nourished Head: normocephalic, atraumatic Eyes: sclera anicteric, no redness Neck: supple Lungs: normal respiratory effort Neurology: alert and oriented Skin: dry, no appreciable rashes Musculoskeletal: no appreciable defects Psychiatric: normal mood and affect Diabetic Foot Exam - Simple   No data filed      Current Medications Patient's Medications  New Prescriptions   INSULIN PEN NEEDLE (PEN NEEDLES) 32G X 4 MM MISC    1 Device by Does not apply route in the morning, at noon, in the evening, and at bedtime.  Previous Medications   ALBUTEROL (VENTOLIN HFA) 108 (90 BASE) MCG/ACT INHALER    Inhale 1-2 puffs into the lungs every 6 (six)  hours as needed for wheezing or shortness of breath.   AMLODIPINE (NORVASC) 2.5 MG TABLET    Take 2.5 mg by mouth daily.   ARIPIPRAZOLE (ABILIFY) 10 MG TABLET    Take 1 tablet (10 mg total) by mouth daily.   ASPIRIN EC 81 MG TABLET    Take 1 tablet (81 mg total) by mouth at bedtime. Swallow whole.   ATORVASTATIN (LIPITOR) 80 MG TABLET    Take 1 tablet (80 mg total) by mouth at bedtime.   CLOPIDOGREL (PLAVIX) 75 MG TABLET    Take 75 mg by mouth at bedtime.   CONTINUOUS GLUCOSE SENSOR (FREESTYLE LIBRE 3 SENSOR) MISC    by Does not apply route.   DOCUSATE SODIUM (COLACE) 100 MG CAPSULE    Take 1 capsule (100 mg total) by mouth 2 (two) times daily.   EMPAGLIFLOZIN (JARDIANCE) 25 MG TABS TABLET    Take 1 tablet (25 mg total) by mouth daily before breakfast.   GLUCAGON (GVOKE HYPOPEN 1-PACK) 1 MG/0.2ML SOAJ    Inject 1 mg into the skin as needed (low blood sugar with impaired consciousness).   INSULIN ASPART (NOVOLOG) 100 UNIT/ML INJECTION    Inject 8 Units into the skin 3 (three) times daily with meals.   LATANOPROST (XALATAN)  0.005 % OPHTHALMIC SOLUTION    Place 1 drop into the left eye at bedtime.   LEVOTHYROXINE (SYNTHROID) 50 MCG TABLET    Take 1 tablet (50 mcg total) by mouth daily before breakfast.   LORATADINE (CLARITIN) 10 MG TABLET    Take 10 mg by mouth daily as needed for allergies.   LOSARTAN (COZAAR) 25 MG TABLET    Take 25 mg by mouth daily.   OZEMPIC, 2 MG/DOSE, 8 MG/3ML SOPN    INJECT 2 MG AS DIRECTED ONCE A WEEK.   PANTOPRAZOLE (PROTONIX) 40 MG TABLET    Take 1 tablet (40 mg total) by mouth at bedtime.   PREGABALIN (LYRICA) 75 MG CAPSULE    Take 1 capsule (75 mg total) by mouth 2 (two) times daily.   RANOLAZINE (RANEXA) 500 MG 12 HR TABLET    Take 1 tablet (500 mg total) by mouth 2 (two) times daily.   SERTRALINE (ZOLOFT) 50 MG TABLET    Take 3 tablets (150 mg total) by mouth at bedtime.   TRAZODONE (DESYREL) 50 MG TABLET    Take 1 tablet (50 mg total) by mouth at bedtime as needed for sleep.  Modified Medications   Modified Medication Previous Medication   TRESIBA FLEXTOUCH 100 UNIT/ML FLEXTOUCH PEN TRESIBA FLEXTOUCH 100 UNIT/ML FlexTouch Pen      Inject 8-10 Units into the skin daily.    Inject 12 Units into the skin daily.  Discontinued Medications   No medications on file    Allergies No Known Allergies  Past Medical History Past Medical History:  Diagnosis Date   Acute kidney injury superimposed on CKD (HCC) 03/15/2019   Acute respiratory failure with hypoxia (HCC) 03/15/2019   Adjustment disorder with mixed anxiety and depressed mood 03/11/2019   Anemia    Anxiety    Arthritis    right ankle   Blind    right eye   CHF (congestive heart failure) (HCC)    Closed right pilon fracture, initial encounter 07/26/2019   COPD (chronic obstructive pulmonary disease) (HCC)    Coronary artery disease    Depression    Diabetes mellitus type 2 in obese 07/26/2019   Diabetes mellitus without complication (HCC)  type 1   Diabetic polyneuropathy (HCC) 05/04/2016   Dyslipidemia 01/03/2020   Fever  03/10/2019   GERD (gastroesophageal reflux disease)    History of blood transfusion    HLD (hyperlipidemia)    Hyperglycemia    Hypertension    Hypothyroidism    Ileus, postoperative (HCC)    Major depression, recurrent (HCC) 03/09/2019   MDD (major depressive disorder), severe (HCC) 03/09/2019   Myocardial infarct, old    2020, s/p DES RCA, RPDA 10/23/19   Neck pain 04/21/2016   Pneumonia 10/2019   Renal disorder    stage 3   Sepsis (HCC) 03/10/2019   Short-term memory loss    mild   Sleep apnea    study done 9/27 and 08/13/20 - no results yet 08/21/20   Stroke (HCC)    Suicidal ideation 03/15/2019   Type 1 diabetes mellitus without complication (HCC) 03/15/2019   Wound infection after surgery (Right ankle) 10/04/2019    Past Surgical History Past Surgical History:  Procedure Laterality Date   ABDOMINAL HYSTERECTOMY  2014   ANKLE FUSION Right 08/23/2020   Procedure: ANKLE FUSION;  Surgeon: Roby Lofts, MD;  Location: [facility] OR;  Service: Orthopedics;  Laterality: Right;   APPLICATION OF WOUND VAC Right 10/11/2019   Procedure: WOUND VAC CHANGE TO RIGHT LEG;  Surgeon: Roby Lofts, MD;  Location: [facility] OR;  Service: Orthopedics;  Laterality: Right;   CORONARY ANGIOPLASTY  10/23/2019   DES RCA, DES RPDA 10/23/19 [facility])   EYE SURGERY     HARDWARE REMOVAL Right 10/06/2019   Procedure: HARDWARE REMOVAL;  Surgeon: Roby Lofts, MD;  Location: [facility] OR;  Service: Orthopedics;  Laterality: Right;   HARDWARE REMOVAL Right 08/23/2020   Procedure: HARDWARE REMOVAL;  Surgeon: Roby Lofts, MD;  Location: [facility] OR;  Service: Orthopedics;  Laterality: Right;   I & D EXTREMITY Right 10/06/2019   Procedure: IRRIGATION AND DEBRIDEMENT EXTREMITY;  Surgeon: Roby Lofts, MD;  Location: [facility] OR;  Service: Orthopedics;  Laterality: Right;   I & D EXTREMITY Right 10/09/2019   Procedure: IRRIGATION AND DEBRIDEMENT EXTREMITY and WOUND VAC CHANGE RIGHT ANKLE;  Surgeon: Roby Lofts, MD;  Location: [facility] OR;   Service: Orthopedics;  Laterality: Right;   ORIF ANKLE FRACTURE Right 07/26/2019   Procedure: OPEN REDUCTION INTERNAL FIXATION (ORIF) ANKLE FRACTURE;  Surgeon: Roby Lofts, MD;  Location: [facility] OR;  Service: Orthopedics;  Laterality: Right;  OPEN REDUCTION INTERNAL FIXATION (ORIF) ANKLE FRACTURE    TOE AMPUTATION      Family History family history includes Congestive Heart Failure in her father and maternal grandfather; Diabetes in her sister; Diabetes Mellitus II in her mother; Heart attack in her mother; Stomach cancer in her father; Stroke in her mother.  Social History Social History   Socioeconomic History   Marital status: Divorced    Spouse name: Not on file   Number of children: Not on file   Years of education: Not on file   Highest education level: Not on file  Occupational History   Not on file  Tobacco Use   Smoking status: Never   Smokeless tobacco: Never  Vaping Use   Vaping status: Never Used  Substance and Sexual Activity   Alcohol use: Not Currently   Drug use: Not Currently   Sexual activity: Not on file    Comment: Hysterectomy  Other Topics Concern   Not on file  Social History Narrative   Not on file   Social Drivers  of Health   Financial Resource Strain: Not on file  Food Insecurity: No Food Insecurity (08/10/2023)   Hunger Vital Sign    Worried About Running Out of Food in the Last Year: Never true    Ran Out of Food in the Last Year: Never true  Transportation Needs: No Transportation Needs (08/10/2023)   PRAPARE - Administrator, Civil Service (Medical): No    Lack of Transportation (Non-Medical): No  Physical Activity: Not on file  Stress: Not on file  Social Connections: Not on file  Intimate Partner Violence: Not At Risk (08/10/2023)   Humiliation, Afraid, Rape, and Kick questionnaire    Fear of Current or Ex-Partner: No    Emotionally Abused: No    Physically Abused: No    Sexually Abused: No    Lab Results  Component Value  Date   HGBA1C 7.2 (A) 12/22/2023   HGBA1C 6.2 (A) 09/22/2023   HGBA1C 6.9 (H) 06/29/2023   Lab Results  Component Value Date   CHOL 100 08/12/2023   Lab Results  Component Value Date   HDL 33 (L) 08/12/2023   Lab Results  Component Value Date   LDLCALC 33 08/12/2023   Lab Results  Component Value Date   TRIG 168 (H) 08/12/2023   Lab Results  Component Value Date   CHOLHDL 3.0 08/12/2023   Lab Results  Component Value Date   CREATININE 1.92 (H) 12/22/2023   Lab Results  Component Value Date   GFR 27.95 (L) 12/09/2021   Lab Results  Component Value Date   MICROALBUR 5.9 (H) 02/05/2022      Component Value Date/Time   NA 141 12/22/2023 1356   K 4.8 12/22/2023 1356   CL 102 12/22/2023 1356   CO2 27 12/22/2023 1356   GLUCOSE 247 (H) 12/22/2023 1356   BUN 26 (H) 12/22/2023 1356   CREATININE 1.92 (H) 12/22/2023 1356   CALCIUM 9.9 12/22/2023 1356   PROT 6.4 (L) 08/12/2023 1834   ALBUMIN 3.3 (L) 08/12/2023 1834   AST 17 08/12/2023 1834   ALT 17 08/12/2023 1834   ALKPHOS 122 08/12/2023 1834   BILITOT 0.5 08/12/2023 1834   GFRNONAA 33 (L) 08/18/2023 0633   GFRAA 31 (L) 07/12/2020 1356      Latest Ref Rng & Units 12/22/2023    1:56 PM 08/18/2023    6:33 AM 08/12/2023    6:34 PM  BMP  Glucose 65 - 99 mg/dL 161  096  045   BUN 7 - 25 mg/dL 26  33  49   Creatinine 0.50 - 1.03 mg/dL 4.09  8.11  9.14   BUN/Creat Ratio 6 - 22 (calc) 14     Sodium 135 - 146 mmol/L 141  135  139   Potassium 3.5 - 5.3 mmol/L 4.8  4.6  4.6   Chloride 98 - 110 mmol/L 102  100  105   CO2 20 - 32 mmol/L 27  26  26    Calcium 8.6 - 10.4 mg/dL 9.9  9.1  8.8        Component Value Date/Time   WBC 4.4 08/09/2023 1848   RBC 4.44 08/09/2023 1848   HGB 12.7 08/09/2023 1848   HCT 39.0 08/09/2023 1848   PLT 171 08/09/2023 1848   MCV 87.8 08/09/2023 1848   MCH 28.6 08/09/2023 1848   MCHC 32.6 08/09/2023 1848   RDW 13.5 08/09/2023 1848   LYMPHSABS 1.0 08/09/2023 1848   MONOABS 0.3  08/09/2023 1848  EOSABS 0.0 08/09/2023 1848   BASOSABS 0.0 08/09/2023 1848     Parts of this note may have been dictated using voice recognition software. There may be variances in spelling and vocabulary which are unintentional. Not all errors are proofread. Please notify the Thereasa Parkin if any discrepancies are noted or if the meaning of any statement is not clear.

## 2024-04-02 ENCOUNTER — Other Ambulatory Visit: Payer: Self-pay | Admitting: "Endocrinology

## 2024-04-04 ENCOUNTER — Ambulatory Visit: Admitting: "Endocrinology

## 2024-04-20 ENCOUNTER — Other Ambulatory Visit: Payer: Self-pay | Admitting: "Endocrinology

## 2024-05-10 ENCOUNTER — Ambulatory Visit (INDEPENDENT_AMBULATORY_CARE_PROVIDER_SITE_OTHER): Admitting: "Endocrinology

## 2024-05-10 ENCOUNTER — Encounter: Payer: Self-pay | Admitting: "Endocrinology

## 2024-05-10 VITALS — BP 100/80 | HR 87 | Ht 62.0 in | Wt 158.0 lb

## 2024-05-10 DIAGNOSIS — Z794 Long term (current) use of insulin: Secondary | ICD-10-CM

## 2024-05-10 DIAGNOSIS — Z7984 Long term (current) use of oral hypoglycemic drugs: Secondary | ICD-10-CM | POA: Diagnosis not present

## 2024-05-10 DIAGNOSIS — E11649 Type 2 diabetes mellitus with hypoglycemia without coma: Secondary | ICD-10-CM | POA: Diagnosis not present

## 2024-05-10 DIAGNOSIS — E7800 Pure hypercholesterolemia, unspecified: Secondary | ICD-10-CM

## 2024-05-10 DIAGNOSIS — Z7985 Long-term (current) use of injectable non-insulin antidiabetic drugs: Secondary | ICD-10-CM

## 2024-05-10 LAB — POCT GLYCOSYLATED HEMOGLOBIN (HGB A1C): Hemoglobin A1C: 7.3 % — AB (ref 4.0–5.6)

## 2024-05-10 NOTE — Progress Notes (Signed)
 Outpatient Endocrinology Note Deanna Birmingham, MD  05/10/24   Deanna Landry Jul 23, 1971 [id number]  Referring Provider: [name], MD Primary Care Provider: [name], MD Reason for consultation: Subjective   Assessment & Plan  Diagnoses and all orders for this visit:  Uncontrolled type 2 diabetes mellitus with hypoglycemia without coma (HCC) -     POCT glycosylated hemoglobin (Hb A1C) -     Ambulatory referral to Podiatry  Long term (current) use of oral hypoglycemic drugs  Long-term (current) use of injectable non-insulin  antidiabetic drugs  Long-term insulin  use (HCC)  Pure hypercholesterolemia   Diabetes Type I complicated by MI, stroke, neuropathy  Lab Results  Component Value Date   GFR 27.95 (L) 12/09/2021   Hba1c goal less than 7, current Hba1c is  Lab Results  Component Value Date   HGBA1C 7.3 (A) 05/10/2024   Will recommend the following:  8-10 units Tresiba  daily Novolog  2 units before each meal (skip if blood sugar is less than 100 before than meal): 15 min before meals if BG is OVER 200 Ozempic  2 mg weekly Jardiance  25 mg every day   Discussed dangers of hypoglycemia, patient reports being legally blind and counts the clicks for insulin , previously instructed patient and mom for supervised doses by mom, mom agreed  No known contraindications/side effects to any of above medications No history of MEN syndrome/medullary thyroid  cancer/pancreatitis or pancreatic cancer in self or family Glucagon  discussed and prescribed with refills on 09/09/23 Lives with mother  -Last LD and Tg are as follows: Lab Results  Component Value Date   LDLCALC 33 08/12/2023    Lab Results  Component Value Date   TRIG 168 (H) 08/12/2023   -On atorvastatin  80 mg every day (non fasting sample led to Tg 168) -Follow low fat diet and exercise   -Blood pressure goal <140/90 - Microalbumin/creatinine goal is < 30 -Last MA/Cr is as follows: Lab Results  Component  Value Date   MICROALBUR 5.9 (H) 02/05/2022   -on ACE/ARB losartan  25 mg qd -diet changes including salt restriction -limit eating outside -counseled BP targets per standards of diabetes care -uncontrolled blood pressure can lead to retinopathy, nephropathy and cardiovascular and atherosclerotic heart disease  Reviewed and counseled on: -A1C target -Blood sugar targets -Complications of uncontrolled diabetes  -Checking blood sugar before meals and bedtime and bring log next visit -All medications with mechanism of action and side effects -Hypoglycemia management: rule of 15's, Glucagon  Emergency Kit and medical alert ID -low-carb low-fat plate-method diet -At least 20 minutes of physical activity per day -Annual dilated retinal eye exam and foot exam -compliance and follow up needs -follow up as scheduled or earlier if problem gets worse  Call if blood sugar is less than 70 or consistently above 250    Take a 15 gm snack of carbohydrate at bedtime before you go to sleep if your blood sugar is less than 100.    If you are going to fast after midnight for a test or procedure, ask your physician for instructions on how to reduce/decrease your insulin  dose.    Call if blood sugar is less than 70 or consistently above 250  -Treating a low sugar by rule of 15  (15 gms of sugar every 15 min until sugar is more than 70) If you feel your sugar is low, test your sugar to be sure If your sugar is low (less than 70), then take 15 grams of a fast acting Carbohydrate (3-4  glucose tablets or glucose gel or 4 ounces of juice or regular soda) Recheck your sugar 15 min after treating low to make sure it is more than 70 If sugar is still less than 70, treat again with 15 grams of carbohydrate          Don't drive the hour of hypoglycemia  If unconscious/unable to eat or drink by mouth, use glucagon  injection or nasal spray baqsimi and call 911. Can repeat again in 15 min if still  unconscious.  Return in about 4 weeks (around 06/07/2024).   I have reviewed current medications, nurse's notes, allergies, vital signs, past medical and surgical history, family medical history, and social history for this encounter. Counseled patient on symptoms, examination findings, lab findings, imaging results, treatment decisions and monitoring and prognosis. The patient understood the recommendations and agrees with the treatment plan. All questions regarding treatment plan were fully answered.  Deanna Birmingham, MD  05/10/24   History of Present Illness [name] is a 53 y.o. year old female who presents for follow up of Type I diabetes mellitus.  Deanna Landry was first diagnosed in age 87.    Home diabetes regimen: 10-12 units Tresiba  daily Jardiance  25 mg every day  Ozempic  2 mg weekly Novolog  2 units if sugar is more than 250, 4 units if sugar is more than 300  Liked Trulicity  1.5 mg weekly better, but not covered by insurance per pt   Previous history:   She has been on insulin  since her diagnosis when she had significant symptomatic hypoglycemia. Has been on various types of insulin  regimens in the past but usually not followed by endocrinologist The last 5 or 6 years she has been on Levemir  and NovoLog  Previous level of control is variable with A1c only inconsistently below 7  She was on the T-slim pump started on 03/04/21 Settings: Basal rates: 12 AM- 2 AM = 0.60, 2 AM-8 AM = 0.65, 8 AM-12 AM = 0.50 ISF: 1: 60 except from 2-8 AM, mealtime carbohydrate ratio 1:1 Target 120 Active insulin  5 hours  COMPLICATIONS +  MI/Stroke -  retinopathy +  neuropathy +  nephropathy  BLOOD SUGAR DATA CGM interpretation: At today's visit, we reviewed her CGM downloads. The full report is scanned in the media. Reviewing the CGM trends, BG are low intermittently across the day, with some morning highs. High variability   Physical Exam  BP 100/80   Pulse 87   Ht 5' 2 (1.575  m)   Wt 158 lb (71.7 kg)   LMP  (LMP Unknown) Comment: hysterectomy 2014  SpO2 94%   BMI 28.90 kg/m    Constitutional: well developed, well nourished Head: normocephalic, atraumatic Eyes: sclera anicteric, no redness Neck: supple Lungs: normal respiratory effort Neurology: alert and oriented Skin: dry, no appreciable rashes Musculoskeletal: no appreciable defects Psychiatric: normal mood and affect Diabetic Foot Exam - Simple   Simple Foot Form Diabetic Foot exam was performed with the following findings: Yes 05/10/2024  9:12 AM  Visual Inspection No deformities, no ulcerations, no other skin breakdown bilaterally: Yes Sensation Testing Intact to touch and monofilament testing bilaterally: Yes Pulse Check Posterior Tibialis and Dorsalis pulse intact bilaterally: Yes Comments Monofilament 2/3 L, 3/3 R, left big toe callus, likely onychomycosis       Current Medications Patient's Medications  New Prescriptions   No medications on file  Previous Medications   ALBUTEROL  (VENTOLIN  HFA) 108 (90 BASE) MCG/ACT INHALER    Inhale 1-2 puffs  into the lungs every 6 (six) hours as needed for wheezing or shortness of breath.   AMLODIPINE  (NORVASC ) 2.5 MG TABLET    Take 2.5 mg by mouth daily.   ARIPIPRAZOLE  (ABILIFY ) 10 MG TABLET    Take 1 tablet (10 mg total) by mouth daily.   ASPIRIN  EC 81 MG TABLET    Take 1 tablet (81 mg total) by mouth at bedtime. Swallow whole.   ATORVASTATIN  (LIPITOR ) 80 MG TABLET    Take 1 tablet (80 mg total) by mouth at bedtime.   CLOPIDOGREL  (PLAVIX ) 75 MG TABLET    Take 75 mg by mouth at bedtime.   CONTINUOUS GLUCOSE SENSOR (FREESTYLE LIBRE 3 SENSOR) MISC    by Does not apply route.   DOCUSATE SODIUM  (COLACE) 100 MG CAPSULE    Take 1 capsule (100 mg total) by mouth 2 (two) times daily.   EMPAGLIFLOZIN  (JARDIANCE ) 25 MG TABS TABLET    Take 1 tablet (25 mg total) by mouth daily before breakfast.   GLUCAGON  (GVOKE HYPOPEN  1-PACK) 1 MG/0.2ML SOAJ    Inject 1 mg into  the skin as needed (low blood sugar with impaired consciousness).   INSULIN  ASPART (NOVOLOG ) 100 UNIT/ML INJECTION    Inject 8 Units into the skin 3 (three) times daily with meals.   INSULIN  DEGLUDEC (TRESIBA  FLEXTOUCH) 100 UNIT/ML FLEXTOUCH PEN    INJECT 14 UNITS INTO THE SKIN DAILY.   INSULIN  PEN NEEDLE (PEN NEEDLES) 32G X 4 MM MISC    1 Device by Does not apply route in the morning, at noon, in the evening, and at bedtime.   LATANOPROST  (XALATAN ) 0.005 % OPHTHALMIC SOLUTION    Place 1 drop into the left eye at bedtime.   LEVOTHYROXINE  (SYNTHROID ) 50 MCG TABLET    Take 1 tablet (50 mcg total) by mouth daily before breakfast.   LORATADINE  (CLARITIN ) 10 MG TABLET    Take 10 mg by mouth daily as needed for allergies.   LOSARTAN  (COZAAR ) 25 MG TABLET    Take 25 mg by mouth daily.   PANTOPRAZOLE  (PROTONIX ) 40 MG TABLET    Take 1 tablet (40 mg total) by mouth at bedtime.   PREGABALIN  (LYRICA ) 75 MG CAPSULE    Take 1 capsule (75 mg total) by mouth 2 (two) times daily.   RANOLAZINE  (RANEXA ) 500 MG 12 HR TABLET    Take 1 tablet (500 mg total) by mouth 2 (two) times daily.   SEMAGLUTIDE , 2 MG/DOSE, (OZEMPIC , 2 MG/DOSE,) 8 MG/3ML SOPN    INJECT 2 MG AS DIRECTED ONCE A WEEK.   SERTRALINE  (ZOLOFT ) 50 MG TABLET    Take 3 tablets (150 mg total) by mouth at bedtime.   TRAZODONE  (DESYREL ) 50 MG TABLET    Take 1 tablet (50 mg total) by mouth at bedtime as needed for sleep.  Modified Medications   No medications on file  Discontinued Medications   No medications on file    Allergies No Known Allergies  Past Medical History Past Medical History:  Diagnosis Date   Acute kidney injury superimposed on CKD (HCC) 03/15/2019   Acute respiratory failure with hypoxia (HCC) 03/15/2019   Adjustment disorder with mixed anxiety and depressed mood 03/11/2019   Anemia    Anxiety    Arthritis    right ankle   Blind    right eye   CHF (congestive heart failure) (HCC)    Closed right pilon fracture, initial encounter  07/26/2019   COPD (chronic obstructive pulmonary disease) (HCC)  Coronary artery disease    Depression    Diabetes mellitus type 2 in obese 07/26/2019   Diabetes mellitus without complication (HCC)    type 1   Diabetic polyneuropathy (HCC) 05/04/2016   Dyslipidemia 01/03/2020   Fever 03/10/2019   GERD (gastroesophageal reflux disease)    History of blood transfusion    HLD (hyperlipidemia)    Hyperglycemia    Hypertension    Hypothyroidism    Ileus, postoperative (HCC)    Major depression, recurrent (HCC) 03/09/2019   MDD (major depressive disorder), severe (HCC) 03/09/2019   Myocardial infarct, old    2020, s/p DES RCA, RPDA 10/23/19   Neck pain 04/21/2016   Pneumonia 10/2019   Renal disorder    stage 3   Sepsis (HCC) 03/10/2019   Short-term memory loss    mild   Sleep apnea    study done 9/27 and 08/13/20 - no results yet 08/21/20   Stroke (HCC)    Suicidal ideation 03/15/2019   Type 1 diabetes mellitus without complication (HCC) 03/15/2019   Wound infection after surgery (Right ankle) 10/04/2019    Past Surgical History Past Surgical History:  Procedure Laterality Date   ABDOMINAL HYSTERECTOMY  2014   ANKLE FUSION Right 08/23/2020   Procedure: ANKLE FUSION;  Surgeon: Kendal Franky SQUIBB, MD;  Location: [facility] OR;  Service: Orthopedics;  Laterality: Right;   APPLICATION OF WOUND VAC Right 10/11/2019   Procedure: WOUND VAC CHANGE TO RIGHT LEG;  Surgeon: Kendal Franky SQUIBB, MD;  Location: [facility] OR;  Service: Orthopedics;  Laterality: Right;   CORONARY ANGIOPLASTY  10/23/2019   DES RCA, DES RPDA 10/23/19 [facility])   EYE SURGERY     HARDWARE REMOVAL Right 10/06/2019   Procedure: HARDWARE REMOVAL;  Surgeon: Kendal Franky SQUIBB, MD;  Location: [facility] OR;  Service: Orthopedics;  Laterality: Right;   HARDWARE REMOVAL Right 08/23/2020   Procedure: HARDWARE REMOVAL;  Surgeon: Kendal Franky SQUIBB, MD;  Location: [facility] OR;  Service: Orthopedics;  Laterality: Right;   I & D EXTREMITY Right 10/06/2019   Procedure: IRRIGATION  AND DEBRIDEMENT EXTREMITY;  Surgeon: Kendal Franky SQUIBB, MD;  Location: [facility];  Service: Orthopedics;  Laterality: Right;   I & D EXTREMITY Right 10/09/2019   Procedure: IRRIGATION AND DEBRIDEMENT EXTREMITY and WOUND VAC CHANGE RIGHT ANKLE;  Surgeon: Kendal Franky SQUIBB, MD;  Location: [facility];  Service: Orthopedics;  Laterality: Right;   ORIF ANKLE FRACTURE Right 07/26/2019   Procedure: OPEN REDUCTION INTERNAL FIXATION (ORIF) ANKLE FRACTURE;  Surgeon: Kendal Franky SQUIBB, MD;  Location: [facility] OR;  Service: Orthopedics;  Laterality: Right;  OPEN REDUCTION INTERNAL FIXATION (ORIF) ANKLE FRACTURE    TOE AMPUTATION      Family History family history includes Congestive Heart Failure in her father and maternal grandfather; Diabetes in her sister; Diabetes Mellitus II in her mother; Heart attack in her mother; Stomach cancer in her father; Stroke in her mother.  Social History Social History   Socioeconomic History   Marital status: Divorced    Spouse name: Not on file   Number of children: Not on file   Years of education: Not on file   Highest education level: Not on file  Occupational History   Not on file  Tobacco Use   Smoking status: Never   Smokeless tobacco: Never  Vaping Use   Vaping status: Never Used  Substance and Sexual Activity   Alcohol  use: Not Currently   Drug use: Not Currently   Sexual activity: Not on file  Comment: Hysterectomy  Other Topics Concern   Not on file  Social History Narrative   Not on file   Social Drivers of Health   Financial Resource Strain: Not on file  Food Insecurity: No Food Insecurity (08/10/2023)   Hunger Vital Sign    Worried About Running Out of Food in the Last Year: Never true    Ran Out of Food in the Last Year: Never true  Transportation Needs: No Transportation Needs (08/10/2023)   PRAPARE - Administrator, Civil Service (Medical): No    Lack of Transportation (Non-Medical): No  Physical Activity: Not on file  Stress: Not on file   Social Connections: Not on file  Intimate Partner Violence: Not At Risk (08/10/2023)   Humiliation, Afraid, Rape, and Kick questionnaire    Fear of Current or Ex-Partner: No    Emotionally Abused: No    Physically Abused: No    Sexually Abused: No    Lab Results  Component Value Date   HGBA1C 7.3 (A) 05/10/2024   HGBA1C 7.2 (A) 12/22/2023   HGBA1C 6.2 (A) 09/22/2023   Lab Results  Component Value Date   CHOL 100 08/12/2023   Lab Results  Component Value Date   HDL 33 (L) 08/12/2023   Lab Results  Component Value Date   LDLCALC 33 08/12/2023   Lab Results  Component Value Date   TRIG 168 (H) 08/12/2023   Lab Results  Component Value Date   CHOLHDL 3.0 08/12/2023   Lab Results  Component Value Date   CREATININE 1.92 (H) 12/22/2023   Lab Results  Component Value Date   GFR 27.95 (L) 12/09/2021   Lab Results  Component Value Date   MICROALBUR 5.9 (H) 02/05/2022      Component Value Date/Time   NA 141 12/22/2023 1356   K 4.8 12/22/2023 1356   CL 102 12/22/2023 1356   CO2 27 12/22/2023 1356   GLUCOSE 247 (H) 12/22/2023 1356   BUN 26 (H) 12/22/2023 1356   CREATININE 1.92 (H) 12/22/2023 1356   CALCIUM  9.9 12/22/2023 1356   PROT 6.4 (L) 08/12/2023 1834   ALBUMIN 3.3 (L) 08/12/2023 1834   AST 17 08/12/2023 1834   ALT 17 08/12/2023 1834   ALKPHOS 122 08/12/2023 1834   BILITOT 0.5 08/12/2023 1834   GFRNONAA 33 (L) 08/18/2023 0633   GFRAA 31 (L) 07/12/2020 1356      Latest Ref Rng & Units 12/22/2023    1:56 PM 08/18/2023    6:33 AM 08/12/2023    6:34 PM  BMP  Glucose 65 - 99 mg/dL 752  619  785   BUN 7 - 25 mg/dL 26  33  49   Creatinine 0.50 - 1.03 mg/dL 8.07  8.15  7.16   BUN/Creat Ratio 6 - 22 (calc) 14     Sodium 135 - 146 mmol/L 141  135  139   Potassium 3.5 - 5.3 mmol/L 4.8  4.6  4.6   Chloride 98 - 110 mmol/L 102  100  105   CO2 20 - 32 mmol/L 27  26  26    Calcium  8.6 - 10.4 mg/dL 9.9  9.1  8.8        Component Value Date/Time   WBC 4.4  08/09/2023 1848   RBC 4.44 08/09/2023 1848   HGB 12.7 08/09/2023 1848   HCT 39.0 08/09/2023 1848   PLT 171 08/09/2023 1848   MCV 87.8 08/09/2023 1848   MCH 28.6 08/09/2023 1848  MCHC 32.6 08/09/2023 1848   RDW 13.5 08/09/2023 1848   LYMPHSABS 1.0 08/09/2023 1848   MONOABS 0.3 08/09/2023 1848   EOSABS 0.0 08/09/2023 1848   BASOSABS 0.0 08/09/2023 1848     Parts of this note may have been dictated using voice recognition software. There may be variances in spelling and vocabulary which are unintentional. Not all errors are proofread. Please notify the dino if any discrepancies are noted or if the meaning of any statement is not clear.

## 2024-05-10 NOTE — Patient Instructions (Signed)
 Will recommend the following:  8-10 units Tresiba  daily Novolog  2 units before each meal (skip if blood sugar is less than 100 before than meal): 15 min before meals if BG is OVER 200 Ozempic  2 mg weekly Jardiance  25 mg every day

## 2024-05-31 ENCOUNTER — Ambulatory Visit (INDEPENDENT_AMBULATORY_CARE_PROVIDER_SITE_OTHER): Admitting: Podiatry

## 2024-05-31 DIAGNOSIS — M79675 Pain in left toe(s): Secondary | ICD-10-CM

## 2024-05-31 DIAGNOSIS — B351 Tinea unguium: Secondary | ICD-10-CM | POA: Diagnosis not present

## 2024-05-31 DIAGNOSIS — M2041 Other hammer toe(s) (acquired), right foot: Secondary | ICD-10-CM | POA: Diagnosis not present

## 2024-05-31 DIAGNOSIS — M79674 Pain in right toe(s): Secondary | ICD-10-CM | POA: Diagnosis not present

## 2024-05-31 DIAGNOSIS — Z89421 Acquired absence of other right toe(s): Secondary | ICD-10-CM

## 2024-05-31 DIAGNOSIS — L84 Corns and callosities: Secondary | ICD-10-CM

## 2024-05-31 DIAGNOSIS — E1042 Type 1 diabetes mellitus with diabetic polyneuropathy: Secondary | ICD-10-CM

## 2024-05-31 NOTE — Progress Notes (Signed)
 Subjective:  Patient ID: Deanna Landry, female    DOB: Mar 21, 1971,  MRN: [id number]  Deanna Landry presents to clinic today for:  Chief Complaint  Patient presents with   [facility]    Last A1c: 7.4. Takes Plavix . Needs nail care done today. Does report numbness and tingling in the right foot, intermittent.  Is legally blind in right eye and only has peripheral vision in the left eye so she is prone to falls.    Patient notes nails are thick and elongated, causing pain in shoe gear when ambulating.  She has diabetic retinopathy and has difficulty seeing her feet to trim the nails.  That she is a type I diabetic.  States she was diagnosed around age 58.  She has a previously amputated right second toe.  States this occurred when she was a child.  She also notes a callus on her left great toe area.  She is interested in diabetic shoes and states it has been over 5 years since she got her last pair.  PCP is [name], MD.  Past Medical History:  Diagnosis Date   Acute kidney injury superimposed on CKD (HCC) 03/15/2019   Acute respiratory failure with hypoxia (HCC) 03/15/2019   Adjustment disorder with mixed anxiety and depressed mood 03/11/2019   Anemia    Anxiety    Arthritis    right ankle   Blind    right eye   CHF (congestive heart failure) (HCC)    Closed right pilon fracture, initial encounter 07/26/2019   COPD (chronic obstructive pulmonary disease) (HCC)    Coronary artery disease    Depression    Diabetes mellitus type 2 in obese 07/26/2019   Diabetes mellitus without complication (HCC)    type 1   Diabetic polyneuropathy (HCC) 05/04/2016   Dyslipidemia 01/03/2020   Fever 03/10/2019   GERD (gastroesophageal reflux disease)    History of blood transfusion    HLD (hyperlipidemia)    Hyperglycemia    Hypertension    Hypothyroidism    Ileus, postoperative (HCC)    Major depression, recurrent (HCC) 03/09/2019   MDD (major depressive disorder), severe (HCC) 03/09/2019   Myocardial  infarct, old    2020, s/p DES RCA, RPDA 10/23/19   Neck pain 04/21/2016   Pneumonia 10/2019   Renal disorder    stage 3   Sepsis (HCC) 03/10/2019   Short-term memory loss    mild   Sleep apnea    study done 9/27 and 08/13/20 - no results yet 08/21/20   Stroke (HCC)    Suicidal ideation 03/15/2019   Type 1 diabetes mellitus without complication (HCC) 03/15/2019   Wound infection after surgery (Right ankle) 10/04/2019   No Known Allergies  Objective:  Deanna Landry is a pleasant 53 y.o. female in NAD. AAO x 3.  Vascular Examination: Patient has palpable DP pulse, absent PT pulse bilateral.  Delayed capillary refill bilateral toes.  Sparse digital hair bilateral.  Proximal to distal cooling WNL bilateral.    Dermatological Examination: Interspaces are clear with no open lesions noted bilateral.  Skin is shiny and atrophic bilateral.  Nails are 3-73mm thick, with yellowish/brown discoloration, subungual debris and distal onycholysis x10.  There is pain with compression of nails x10.  There are hyperkeratotic lesions noted on the plantar medial aspect of the left first MPJ.     Latest Ref Rng & Units 05/10/2024    9:01 AM 12/22/2023    1:27  PM 09/22/2023    3:22 PM 06/29/2023   11:20 AM  Hemoglobin A1C  Hemoglobin-A1c 4.0 - 5.6 % 7.3  7.2  6.2  6.9    Patient qualifies for at-risk foot care because of history of right second toe amputation.  Assessment/Plan: 1. Pain due to onychomycosis of toenails of both feet   2. Type 1 diabetes mellitus with peripheral neuropathy (HCC)   3. History of amputation of lesser toe of right foot (HCC)   4. Hammertoe of right foot     Mycotic nails x10 were sharply debrided with sterile nail nippers and power debriding burr to decrease bulk and length.  Hyperkeratotic lesions x 1 was shaved with #312 blade.  Discussed the diabetic shoe program with the patient today.  Will get her set up with a prescription for Meryle to be evaluated for the  shoes.  Return in about 3 months (around 08/31/2024) for [facility].   Awanda CHARM Imperial, DPM, FACFAS [facility]     [address] [place], [place] [address]                Office [phone]  Fax [phone]

## 2024-06-12 ENCOUNTER — Ambulatory Visit (INDEPENDENT_AMBULATORY_CARE_PROVIDER_SITE_OTHER): Admitting: "Endocrinology

## 2024-06-12 ENCOUNTER — Encounter: Payer: Self-pay | Admitting: "Endocrinology

## 2024-06-12 VITALS — BP 112/70 | HR 85 | Ht 62.0 in | Wt 159.0 lb

## 2024-06-12 DIAGNOSIS — Z7984 Long term (current) use of oral hypoglycemic drugs: Secondary | ICD-10-CM | POA: Diagnosis not present

## 2024-06-12 DIAGNOSIS — Z7985 Long-term (current) use of injectable non-insulin antidiabetic drugs: Secondary | ICD-10-CM

## 2024-06-12 DIAGNOSIS — Z794 Long term (current) use of insulin: Secondary | ICD-10-CM

## 2024-06-12 DIAGNOSIS — E11649 Type 2 diabetes mellitus with hypoglycemia without coma: Secondary | ICD-10-CM | POA: Diagnosis not present

## 2024-06-12 DIAGNOSIS — E7800 Pure hypercholesterolemia, unspecified: Secondary | ICD-10-CM

## 2024-06-12 NOTE — Patient Instructions (Signed)
 Will recommend the following:  6-7 units Tresiba  daily Novolog  3 units before each meal (skip if blood sugar is less than 100 before than meal): 15 min before meals  Ozempic  2 mg weekly Jardiance  25 mg every day  _________    Goals of DM therapy:  Morning Fasting blood sugar: 80-140  Blood sugar before meals: 80-140 Bed time blood sugar: 100-150  A1C <7%, limited only by hypoglycemia  1.Diabetes medications and their side effects discussed, including hypoglycemia    2. Check blood glucose:  a) Always check blood sugars before driving. Please see below (under hypoglycemia) on how to manage b) Check a minimum of 3 times/day or more as needed when having symptoms of hypoglycemia.   c) Try to check blood glucose before sleeping/in the middle of the night to ensure that it is remaining stable and not dropping less than 100 d) Check blood glucose more often if sick  3. Diet: a) 3 meals per day schedule b: Restrict carbs to 60-70 grams (4 servings) per meal c) Colorful vegetables - 3 servings a day, and low sugar fruit 2 servings/day Plate control method: 1/4 plate protein, 1/4 starch, 1/2 green, yellow, or red vegetables d) Avoid carbohydrate snacks unless hypoglycemic episode, or increased physical activity  4. Regular exercise as tolerated, preferably 3 or more hours a week  5. Hypoglycemia: a)  Do not drive or operate machinery without first testing blood glucose to assure it is over 90 mg%, or if dizzy, lightheaded, not feeling normal, etc, or  if foot or leg is numb or weak. b)  If blood glucose less than 70, take four 5gm Glucose tabs or 15-30 gm Glucose gel.  Repeat every 15 min as needed until blood sugar is >100 mg/dl. If hypoglycemia persists then call 911.   6. Sick day management: a) Check blood glucose more often b) Continue usual therapy if blood sugars are elevated.   7. Contact the doctor immediately if blood glucose is frequently <60 mg/dl, or an episode of  severe hypoglycemia occurs (where someone had to give you glucose/  glucagon  or if you passed out from a low blood glucose), or if blood glucose is persistently >350 mg/dl, for further management  8. A change in level of physical activity or exercise and a change in diet may also affect your blood sugar. Check blood sugars more often and call if needed.  Instructions: 1. Bring glucose meter, blood glucose records on every visit for review 2. Continue to follow up with primary care physician and other providers for medical care 3. Yearly eye  and foot exam 4. Please get blood work done prior to the next appointment

## 2024-06-12 NOTE — Progress Notes (Signed)
 Outpatient Endocrinology Note Deanna Birmingham, MD  06/12/24   Deanna Landry 02-17-1971 [id number]  Referring Provider: [name], MD Primary Care Provider: [name], MD Reason for consultation: Subjective   Assessment & Plan  Diagnoses and all orders for this visit:  Uncontrolled type 2 diabetes mellitus with hypoglycemia without coma (HCC) -     Lipid panel -     Microalbumin / creatinine urine ratio -     Comprehensive metabolic panel with GFR  Long term (current) use of oral hypoglycemic drugs  Long-term (current) use of injectable non-insulin  antidiabetic drugs  Long-term insulin  use (HCC)  Pure hypercholesterolemia   Diabetes Type I complicated by MI, stroke, neuropathy  Lab Results  Component Value Date   GFR 27.95 (L) 12/09/2021   Hba1c goal less than 7, current Hba1c is  Lab Results  Component Value Date   HGBA1C 7.3 (A) 05/10/2024   Will recommend the following:  7 units Tresiba  daily Novolog  3 units before each meal (skip if blood sugar is less than 100 before than meal): 15 min before meals  Ozempic  2 mg weekly Jardiance  25 mg every day  Has low BG when eating less, patient is  trying to improve eating habits   Discussed dangers of hypoglycemia, patient reports being legally blind and counts the clicks for insulin , previously instructed patient and mom for supervised doses by mom, mom agreed  No known contraindications/side effects to any of above medications No history of MEN syndrome/medullary thyroid  cancer/pancreatitis or pancreatic cancer in self or family Glucagon  discussed and prescribed with refills on 09/09/23 Lives with mother  -Last LD and Tg are as follows: Lab Results  Component Value Date   LDLCALC 33 08/12/2023    Lab Results  Component Value Date   TRIG 168 (H) 08/12/2023   -On atorvastatin  80 mg every day (non fasting sample led to Tg 168) -Follow low fat diet and exercise   -Blood pressure goal <140/90 -  Microalbumin/creatinine goal is < 30 -Last MA/Cr is as follows: No results found for: MICROALBUR, [id number]  -on ACE/ARB losartan  25 mg qd -diet changes including salt restriction -limit eating outside -counseled BP targets per standards of diabetes care -uncontrolled blood pressure can lead to retinopathy, nephropathy and cardiovascular and atherosclerotic heart disease  Reviewed and counseled on: -A1C target -Blood sugar targets -Complications of uncontrolled diabetes  -Checking blood sugar before meals and bedtime and bring log next visit -All medications with mechanism of action and side effects -Hypoglycemia management: rule of 15's, Glucagon  Emergency Kit and medical alert ID -low-carb low-fat plate-method diet -At least 20 minutes of physical activity per day -Annual dilated retinal eye exam and foot exam -compliance and follow up needs -follow up as scheduled or earlier if problem gets worse  Call if blood sugar is less than 70 or consistently above 250    Take a 15 gm snack of carbohydrate at bedtime before you go to sleep if your blood sugar is less than 100.    If you are going to fast after midnight for a test or procedure, ask your physician for instructions on how to reduce/decrease your insulin  dose.    Call if blood sugar is less than 70 or consistently above 250  -Treating a low sugar by rule of 15  (15 gms of sugar every 15 min until sugar is more than 70) If you feel your sugar is low, test your sugar to be sure If your sugar is low (less  than 70), then take 15 grams of a fast acting Carbohydrate (3-4 glucose tablets or glucose gel or 4 ounces of juice or regular soda) Recheck your sugar 15 min after treating low to make sure it is more than 70 If sugar is still less than 70, treat again with 15 grams of carbohydrate          Don't drive the hour of hypoglycemia  If unconscious/unable to eat or drink by mouth, use glucagon  injection or nasal spray baqsimi  and call 911. Can repeat again in 15 min if still unconscious.  Return in about 6 weeks (around 07/24/2024) for visit and 8 am labs before next visit.   I have reviewed current medications, nurse's notes, allergies, vital signs, past medical and surgical history, family medical history, and social history for this encounter. Counseled patient on symptoms, examination findings, lab findings, imaging results, treatment decisions and monitoring and prognosis. The patient understood the recommendations and agrees with the treatment plan. All questions regarding treatment plan were fully answered.  Deanna Birmingham, MD  06/12/24   History of Present Illness Deanna Landry is a 53 y.o. year old female who presents for follow up of Type I diabetes mellitus.  Deanna Landry was first diagnosed in age 39.    Home diabetes regimen: 8 units Tresiba  daily Jardiance  25 mg every day  Ozempic  2 mg weekly Novolog  2 units if sugar is more than 250, 4 units if sugar is more than 300  Liked Trulicity  1.5 mg weekly better, but not covered by insurance per pt   Previous history:   She has been on insulin  since her diagnosis when she had significant symptomatic hypoglycemia. Has been on various types of insulin  regimens in the past but usually not followed by endocrinologist The last 5 or 6 years she has been on Levemir  and NovoLog  Previous level of control is variable with A1c only inconsistently below 7  She was on the T-slim pump started on 03/04/21 Settings: Basal rates: 12 AM- 2 AM = 0.60, 2 AM-8 AM = 0.65, 8 AM-12 AM = 0.50 ISF: 1: 60 except from 2-8 AM, mealtime carbohydrate ratio 1:1 Target 120 Active insulin  5 hours  COMPLICATIONS +  MI/Stroke -  retinopathy +  neuropathy +  nephropathy  BLOOD SUGAR DATA CGM interpretation: At today's visit, we reviewed her CGM downloads. The full report is scanned in the media. Reviewing the CGM trends, BG are low intermittently across the day, with elevations  overnight as well as after breakfast.   Physical Exam  BP 112/70   Pulse 85   Ht 5' 2 (1.575 m)   Wt 159 lb (72.1 kg)   LMP  (LMP Unknown) Comment: hysterectomy 2014  SpO2 98%   BMI 29.08 kg/m    Constitutional: well developed, well nourished Head: normocephalic, atraumatic Eyes: sclera anicteric, no redness Neck: supple Lungs: normal respiratory effort Neurology: alert and oriented Skin: dry, no appreciable rashes Musculoskeletal: no appreciable defects Psychiatric: normal mood and affect Diabetic Foot Exam - Simple   No data filed      Current Medications Patient's Medications  New Prescriptions   No medications on file  Previous Medications   ARIPIPRAZOLE  (ABILIFY ) 10 MG TABLET    Take 1 tablet (10 mg total) by mouth daily.   ASPIRIN  EC 81 MG TABLET    Take 1 tablet (81 mg total) by mouth at bedtime. Swallow whole.   ATORVASTATIN  (LIPITOR ) 80 MG TABLET    Take 1  tablet (80 mg total) by mouth at bedtime.   CLOPIDOGREL  (PLAVIX ) 75 MG TABLET    Take 75 mg by mouth at bedtime.   CONTINUOUS GLUCOSE SENSOR (FREESTYLE LIBRE 3 SENSOR) MISC    by Does not apply route.   DOCUSATE SODIUM  (COLACE) 100 MG CAPSULE    Take 1 capsule (100 mg total) by mouth 2 (two) times daily.   EMPAGLIFLOZIN  (JARDIANCE ) 25 MG TABS TABLET    Take 1 tablet (25 mg total) by mouth daily before breakfast.   GLUCAGON  (GVOKE HYPOPEN  1-PACK) 1 MG/0.2ML SOAJ    Inject 1 mg into the skin as needed (low blood sugar with impaired consciousness).   INSULIN  ASPART (NOVOLOG ) 100 UNIT/ML INJECTION    Inject 8 Units into the skin 3 (three) times daily with meals.   INSULIN  DEGLUDEC (TRESIBA  FLEXTOUCH) 100 UNIT/ML FLEXTOUCH PEN    INJECT 14 UNITS INTO THE SKIN DAILY.   INSULIN  PEN NEEDLE (PEN NEEDLES) 32G X 4 MM MISC    1 Device by Does not apply route in the morning, at noon, in the evening, and at bedtime.   LATANOPROST  (XALATAN ) 0.005 % OPHTHALMIC SOLUTION    Place 1 drop into the left eye at bedtime.    LEVOTHYROXINE  (SYNTHROID ) 50 MCG TABLET    Take 1 tablet (50 mcg total) by mouth daily before breakfast.   LORATADINE  (CLARITIN ) 10 MG TABLET    Take 10 mg by mouth daily as needed for allergies.   LOSARTAN  (COZAAR ) 25 MG TABLET    Take 25 mg by mouth daily.   PANTOPRAZOLE  (PROTONIX ) 40 MG TABLET    Take 1 tablet (40 mg total) by mouth at bedtime.   PREGABALIN  (LYRICA ) 75 MG CAPSULE    Take 1 capsule (75 mg total) by mouth 2 (two) times daily.   SEMAGLUTIDE , 2 MG/DOSE, (OZEMPIC , 2 MG/DOSE,) 8 MG/3ML SOPN    INJECT 2 MG AS DIRECTED ONCE A WEEK.   SERTRALINE  (ZOLOFT ) 50 MG TABLET    Take 3 tablets (150 mg total) by mouth at bedtime.  Modified Medications   No medications on file  Discontinued Medications   No medications on file    Allergies No Known Allergies  Past Medical History Past Medical History:  Diagnosis Date   Acute kidney injury superimposed on CKD (HCC) 03/15/2019   Acute respiratory failure with hypoxia (HCC) 03/15/2019   Adjustment disorder with mixed anxiety and depressed mood 03/11/2019   Anemia    Anxiety    Arthritis    right ankle   Blind    right eye   CHF (congestive heart failure) (HCC)    Closed right pilon fracture, initial encounter 07/26/2019   COPD (chronic obstructive pulmonary disease) (HCC)    Coronary artery disease    Depression    Diabetes mellitus type 2 in obese 07/26/2019   Diabetes mellitus without complication (HCC)    type 1   Diabetic polyneuropathy (HCC) 05/04/2016   Dyslipidemia 01/03/2020   Fever 03/10/2019   GERD (gastroesophageal reflux disease)    History of blood transfusion    HLD (hyperlipidemia)    Hyperglycemia    Hypertension    Hypothyroidism    Ileus, postoperative (HCC)    Major depression, recurrent (HCC) 03/09/2019   MDD (major depressive disorder), severe (HCC) 03/09/2019   Myocardial infarct, old    2020, s/p DES RCA, RPDA 10/23/19   Neck pain 04/21/2016   Pneumonia 10/2019   Renal disorder    stage 3   Sepsis (  HCC)  03/10/2019   Short-term memory loss    mild   Sleep apnea    study done 9/27 and 08/13/20 - no results yet 08/21/20   Stroke [facility])    Suicidal ideation 03/15/2019   Type 1 diabetes mellitus without complication (HCC) 03/15/2019   Wound infection after surgery (Right ankle) 10/04/2019    Past Surgical History Past Surgical History:  Procedure Laterality Date   ABDOMINAL HYSTERECTOMY  2014   ANKLE FUSION Right 08/23/2020   Procedure: ANKLE FUSION;  Surgeon: Kendal Franky SQUIBB, MD;  Location: [facility] OR;  Service: Orthopedics;  Laterality: Right;   APPLICATION OF WOUND VAC Right 10/11/2019   Procedure: WOUND VAC CHANGE TO RIGHT LEG;  Surgeon: Kendal Franky SQUIBB, MD;  Location: [facility] OR;  Service: Orthopedics;  Laterality: Right;   CORONARY ANGIOPLASTY  10/23/2019   DES RCA, DES RPDA 10/23/19 [facility])   EYE SURGERY     HARDWARE REMOVAL Right 10/06/2019   Procedure: HARDWARE REMOVAL;  Surgeon: Kendal Franky SQUIBB, MD;  Location: [facility] OR;  Service: Orthopedics;  Laterality: Right;   HARDWARE REMOVAL Right 08/23/2020   Procedure: HARDWARE REMOVAL;  Surgeon: Kendal Franky SQUIBB, MD;  Location: [facility] OR;  Service: Orthopedics;  Laterality: Right;   I & D EXTREMITY Right 10/06/2019   Procedure: IRRIGATION AND DEBRIDEMENT EXTREMITY;  Surgeon: Kendal Franky SQUIBB, MD;  Location: [facility] OR;  Service: Orthopedics;  Laterality: Right;   I & D EXTREMITY Right 10/09/2019   Procedure: IRRIGATION AND DEBRIDEMENT EXTREMITY and WOUND VAC CHANGE RIGHT ANKLE;  Surgeon: Kendal Franky SQUIBB, MD;  Location: [facility] OR;  Service: Orthopedics;  Laterality: Right;   ORIF ANKLE FRACTURE Right 07/26/2019   Procedure: OPEN REDUCTION INTERNAL FIXATION (ORIF) ANKLE FRACTURE;  Surgeon: Kendal Franky SQUIBB, MD;  Location: [facility] OR;  Service: Orthopedics;  Laterality: Right;  OPEN REDUCTION INTERNAL FIXATION (ORIF) ANKLE FRACTURE    TOE AMPUTATION      Family History family history includes Congestive Heart Failure in her father and maternal grandfather; Diabetes in her sister;  Diabetes Mellitus II in her mother; Heart attack in her mother; Stomach cancer in her father; Stroke in her mother.  Social History Social History   Socioeconomic History   Marital status: Divorced    Spouse name: Not on file   Number of children: Not on file   Years of education: Not on file   Highest education level: Not on file  Occupational History   Not on file  Tobacco Use   Smoking status: Never   Smokeless tobacco: Never  Vaping Use   Vaping status: Never Used  Substance and Sexual Activity   Alcohol  use: Not Currently   Drug use: Not Currently   Sexual activity: Not on file    Comment: Hysterectomy  Other Topics Concern   Not on file  Social History Narrative   Not on file   Social Drivers of Health   Financial Resource Strain: Not on file  Food Insecurity: No Food Insecurity (08/10/2023)   Hunger Vital Sign    Worried About Running Out of Food in the Last Year: Never true    Ran Out of Food in the Last Year: Never true  Transportation Needs: No Transportation Needs (08/10/2023)   PRAPARE - Administrator, Civil Service (Medical): No    Lack of Transportation (Non-Medical): No  Physical Activity: Not on file  Stress: Not on file  Social Connections: Not on file  Intimate Partner Violence: Not At Risk (08/10/2023)  Humiliation, Afraid, Rape, and Kick questionnaire    Fear of Current or Ex-Partner: No    Emotionally Abused: No    Physically Abused: No    Sexually Abused: No    Lab Results  Component Value Date   HGBA1C 7.3 (A) 05/10/2024   HGBA1C 7.2 (A) 12/22/2023   HGBA1C 6.2 (A) 09/22/2023   Lab Results  Component Value Date   CHOL 100 08/12/2023   Lab Results  Component Value Date   HDL 33 (L) 08/12/2023   Lab Results  Component Value Date   LDLCALC 33 08/12/2023   Lab Results  Component Value Date   TRIG 168 (H) 08/12/2023   Lab Results  Component Value Date   CHOLHDL 3.0 08/12/2023   Lab Results  Component Value Date    CREATININE 1.92 (H) 12/22/2023   Lab Results  Component Value Date   GFR 27.95 (L) 12/09/2021   No results found for: MACKEY CURRENT     Component Value Date/Time   NA 141 12/22/2023 1356   K 4.8 12/22/2023 1356   CL 102 12/22/2023 1356   CO2 27 12/22/2023 1356   GLUCOSE 247 (H) 12/22/2023 1356   BUN 26 (H) 12/22/2023 1356   CREATININE 1.92 (H) 12/22/2023 1356   CALCIUM  9.9 12/22/2023 1356   PROT 6.4 (L) 08/12/2023 1834   ALBUMIN 3.3 (L) 08/12/2023 1834   AST 17 08/12/2023 1834   ALT 17 08/12/2023 1834   ALKPHOS 122 08/12/2023 1834   BILITOT 0.5 08/12/2023 1834   GFRNONAA 33 (L) 08/18/2023 0633   GFRAA 31 (L) 07/12/2020 1356      Latest Ref Rng & Units 12/22/2023    1:56 PM 08/18/2023    6:33 AM 08/12/2023    6:34 PM  BMP  Glucose 65 - 99 mg/dL 752  619  785   BUN 7 - 25 mg/dL 26  33  49   Creatinine 0.50 - 1.03 mg/dL 8.07  8.15  7.16   BUN/Creat Ratio 6 - 22 (calc) 14     Sodium 135 - 146 mmol/L 141  135  139   Potassium 3.5 - 5.3 mmol/L 4.8  4.6  4.6   Chloride 98 - 110 mmol/L 102  100  105   CO2 20 - 32 mmol/L 27  26  26    Calcium  8.6 - 10.4 mg/dL 9.9  9.1  8.8        Component Value Date/Time   WBC 4.4 08/09/2023 1848   RBC 4.44 08/09/2023 1848   HGB 12.7 08/09/2023 1848   HCT 39.0 08/09/2023 1848   PLT 171 08/09/2023 1848   MCV 87.8 08/09/2023 1848   MCH 28.6 08/09/2023 1848   MCHC 32.6 08/09/2023 1848   RDW 13.5 08/09/2023 1848   LYMPHSABS 1.0 08/09/2023 1848   MONOABS 0.3 08/09/2023 1848   EOSABS 0.0 08/09/2023 1848   BASOSABS 0.0 08/09/2023 1848     Parts of this note may have been dictated using voice recognition software. There may be variances in spelling and vocabulary which are unintentional. Not all errors are proofread. Please notify the dino if any discrepancies are noted or if the meaning of any statement is not clear.

## 2024-06-13 ENCOUNTER — Encounter: Payer: Self-pay | Admitting: "Endocrinology

## 2024-06-16 ENCOUNTER — Other Ambulatory Visit: Payer: Self-pay | Admitting: "Endocrinology

## 2024-06-16 NOTE — Telephone Encounter (Signed)
 Requested Prescriptions   Pending Prescriptions Disp Refills   JARDIANCE  25 MG TABS tablet [Pharmacy Med Name: JARDIANCE  25 MG TABLET] 90 tablet 1    Sig: TAKE 1 TABLET BY MOUTH DAILY BEFORE BREAKFAST.

## 2024-06-27 ENCOUNTER — Other Ambulatory Visit: Payer: Self-pay | Admitting: "Endocrinology

## 2024-07-14 ENCOUNTER — Other Ambulatory Visit: Payer: Self-pay | Admitting: "Endocrinology

## 2024-07-14 NOTE — Telephone Encounter (Signed)
 Requested Prescriptions   Pending Prescriptions Disp Refills   EMBECTA PEN NEEDLE NANO 2 GEN 32G X 4 MM MISC [Pharmacy Med Name: NANO 2 GEN PEN NEEDLE 32G 4MM] 200 each 2    Sig: 1 DEVICE BY DOES NOT APPLY ROUTE IN THE MORNING, AT NOON, IN THE EVENING, AND AT BEDTIME.

## 2024-07-20 ENCOUNTER — Other Ambulatory Visit

## 2024-07-21 LAB — COMPREHENSIVE METABOLIC PANEL WITH GFR
AG Ratio: 1.4 (calc) (ref 1.0–2.5)
ALT: 9 U/L (ref 6–29)
AST: 14 U/L (ref 10–35)
Albumin: 4.1 g/dL (ref 3.6–5.1)
Alkaline phosphatase (APISO): 157 U/L — ABNORMAL HIGH (ref 37–153)
BUN/Creatinine Ratio: 19 (calc) (ref 6–22)
BUN: 44 mg/dL — ABNORMAL HIGH (ref 7–25)
CO2: 30 mmol/L (ref 20–32)
Calcium: 9.5 mg/dL (ref 8.6–10.4)
Chloride: 105 mmol/L (ref 98–110)
Creat: 2.26 mg/dL — ABNORMAL HIGH (ref 0.50–1.03)
Globulin: 3 g/dL (ref 1.9–3.7)
Glucose, Bld: 131 mg/dL — ABNORMAL HIGH (ref 65–99)
Potassium: 4.9 mmol/L (ref 3.5–5.3)
Sodium: 141 mmol/L (ref 135–146)
Total Bilirubin: 0.3 mg/dL (ref 0.2–1.2)
Total Protein: 7.1 g/dL (ref 6.1–8.1)
eGFR: 25 mL/min/1.73m2 — ABNORMAL LOW (ref >=60)

## 2024-07-21 LAB — LIPID PANEL
Cholesterol: 131 mg/dL (ref <200)
HDL: 58 mg/dL (ref >=50)
LDL Cholesterol (Calc): 52 mg/dL
Non-HDL Cholesterol (Calc): 73 mg/dL (ref <130)
Total CHOL/HDL Ratio: 2.3 (calc) (ref <5.0)
Triglycerides: 124 mg/dL (ref <150)

## 2024-07-21 LAB — MICROALBUMIN / CREATININE URINE RATIO
Creatinine, Urine: 78 mg/dL (ref 20–275)
Microalb Creat Ratio: 21 mg/g{creat} (ref <30)
Microalb, Ur: 1.6 mg/dL

## 2024-07-25 ENCOUNTER — Ambulatory Visit: Admitting: "Endocrinology

## 2024-07-27 ENCOUNTER — Ambulatory Visit: Admitting: "Endocrinology

## 2024-08-10 ENCOUNTER — Ambulatory Visit (INDEPENDENT_AMBULATORY_CARE_PROVIDER_SITE_OTHER): Admitting: "Endocrinology

## 2024-08-10 ENCOUNTER — Encounter: Payer: Self-pay | Admitting: "Endocrinology

## 2024-08-10 VITALS — BP 100/80 | HR 82 | Ht 62.0 in | Wt 161.0 lb

## 2024-08-10 DIAGNOSIS — E11649 Type 2 diabetes mellitus with hypoglycemia without coma: Secondary | ICD-10-CM | POA: Diagnosis not present

## 2024-08-10 DIAGNOSIS — E7800 Pure hypercholesterolemia, unspecified: Secondary | ICD-10-CM

## 2024-08-10 DIAGNOSIS — Z794 Long term (current) use of insulin: Secondary | ICD-10-CM | POA: Diagnosis not present

## 2024-08-10 DIAGNOSIS — Z7985 Long-term (current) use of injectable non-insulin antidiabetic drugs: Secondary | ICD-10-CM

## 2024-08-10 DIAGNOSIS — Z7984 Long term (current) use of oral hypoglycemic drugs: Secondary | ICD-10-CM

## 2024-08-10 LAB — POCT GLYCOSYLATED HEMOGLOBIN (HGB A1C): Hemoglobin A1C: 7.1 % — AB (ref 4.0–5.6)

## 2024-08-10 MED ORDER — OZEMPIC (2 MG/DOSE) 8 MG/3ML ~~LOC~~ SOPN: 2.0000 mg | PEN_INJECTOR | SUBCUTANEOUS | 3 refills | Status: AC

## 2024-08-10 MED ORDER — INSULIN ASPART (W/NIACINAMIDE) 100 UNIT/ML ~~LOC~~ SOPN: PEN_INJECTOR | SUBCUTANEOUS | 5 refills | Status: DC

## 2024-08-10 NOTE — Progress Notes (Signed)
 Outpatient Endocrinology Note Deanna Birmingham, MD  08/10/24   Deanna Landry 1971/06/27 [id number]  Referring Provider: [name], MD Primary Care Provider: [name], MD Reason for consultation: Subjective   Assessment & Plan  Diagnoses and all orders for this visit:  Uncontrolled type 2 diabetes mellitus with hypoglycemia without coma (HCC) -     POCT glycosylated hemoglobin (Hb A1C)  Other orders -     insulin  aspart (FIASP ) 100 UNIT/ML FlexTouch Pen; 3 units fifteen min before each meal (take 4 units if blood sugar is more than 250)   Diabetes Type I complicated by MI, stroke, neuropathy  Lab Results  Component Value Date   GFR 27.95 (L) 12/09/2021   Hba1c goal less than 7, current Hba1c is  Lab Results  Component Value Date   HGBA1C 7.1 (A) 08/10/2024   Will recommend the following:  7 units Tresiba  daily Humalog 3-4 units before each meal (skip if blood sugar is less than 100 before than meal): 15 min before meals ) 3 units if sugar is more than 250, 4 units if sugar is more than 300 Ozempic  2 mg weekly Jardiance  25 mg every day  Has low BG when eating less, patient is trying to improve eating habits  Instructed to replace diet mountain dew with glucerna  Previsouly, discussed dangers of hypoglycemia, patient reports being legally blind and counts the clicks for insulin , previously instructed patient and mom for supervised doses by mom, mom agreed  No known contraindications/side effects to any of above medications No history of MEN syndrome/medullary thyroid  cancer/pancreatitis or pancreatic cancer in self or family Glucagon  discussed and prescribed with refills on 09/09/23 Lives with mother  -Last LD and Tg are as follows: Lab Results  Component Value Date   LDLCALC 52 07/20/2024    Lab Results  Component Value Date   TRIG 124 07/20/2024   -On atorvastatin  80 mg every day (non fasting sample led to Tg 168) -Follow low fat diet and exercise    -Blood pressure goal <140/90 - Microalbumin/creatinine goal is < 30 -Last MA/Cr is as follows: Lab Results  Component Value Date   MICROALBUR 1.6 07/20/2024    -on ACE/ARB losartan  25 mg qd -diet changes including salt restriction -limit eating outside -counseled BP targets per standards of diabetes care -uncontrolled blood pressure can lead to retinopathy, nephropathy and cardiovascular and atherosclerotic heart disease  Reviewed and counseled on: -A1C target -Blood sugar targets -Complications of uncontrolled diabetes  -Checking blood sugar before meals and bedtime and bring log next visit -All medications with mechanism of action and side effects -Hypoglycemia management: rule of 15's, Glucagon  Emergency Kit and medical alert ID -low-carb low-fat plate-method diet -At least 20 minutes of physical activity per day -Annual dilated retinal eye exam and foot exam -compliance and follow up needs -follow up as scheduled or earlier if problem gets worse  Call if blood sugar is less than 70 or consistently above 250    Take a 15 gm snack of carbohydrate at bedtime before you go to sleep if your blood sugar is less than 100.    If you are going to fast after midnight for a test or procedure, ask your physician for instructions on how to reduce/decrease your insulin  dose.    Call if blood sugar is less than 70 or consistently above 250  -Treating a low sugar by rule of 15  (15 gms of sugar every 15 min until sugar is more than 70)  If you feel your sugar is low, test your sugar to be sure If your sugar is low (less than 70), then take 15 grams of a fast acting Carbohydrate (3-4 glucose tablets or glucose gel or 4 ounces of juice or regular soda) Recheck your sugar 15 min after treating low to make sure it is more than 70 If sugar is still less than 70, treat again with 15 grams of carbohydrate          Don't drive the hour of hypoglycemia  If unconscious/unable to eat or drink by  mouth, use glucagon  injection or nasal spray baqsimi and call 911. Can repeat again in 15 min if still unconscious.  No follow-ups on file.   I have reviewed current medications, nurse's notes, allergies, vital signs, past medical and surgical history, family medical history, and social history for this encounter. Counseled patient on symptoms, examination findings, lab findings, imaging results, treatment decisions and monitoring and prognosis. The patient understood the recommendations and agrees with the treatment plan. All questions regarding treatment plan were fully answered.  Deanna Birmingham, MD  08/10/24   History of Present Illness Deanna Landry is a 53 y.o. year old female who presents for follow up of Type I diabetes mellitus.  Jaclin D Bubar was first diagnosed in age 67.    Home diabetes regimen: 7 units Tresiba  daily Jardiance  25 mg every day  Ozempic  2 mg weekly Novolog  2 -4 units tidac   Liked Trulicity  1.5 mg weekly better, but not covered by insurance per pt   Previous history:   She has been on insulin  since her diagnosis when she had significant symptomatic hypoglycemia. Has been on various types of insulin  regimens in the past but usually not followed by endocrinologist The last 5 or 6 years she has been on Levemir  and NovoLog  Previous level of control is variable with A1c only inconsistently below 7  She was on the T-slim pump started on 03/04/21 Settings: Basal rates: 12 AM- 2 AM = 0.60, 2 AM-8 AM = 0.65, 8 AM-12 AM = 0.50 ISF: 1: 60 except from 2-8 AM, mealtime carbohydrate ratio 1:1 Target 120 Active insulin  5 hours  COMPLICATIONS +  MI/Stroke -  retinopathy +  neuropathy +  nephropathy  BLOOD SUGAR DATA CGM interpretation: At today's visit, we reviewed her CGM downloads. The full report is scanned in the media. Reviewing the CGM trends, BG are low intermittently across the day, particularly overnight with normal to highs sugars across the  day.  Physical Exam  BP 100/80   Pulse 82   Ht 5' 2 (1.575 m)   Wt 161 lb (73 kg)   LMP  (LMP Unknown) Comment: hysterectomy 2014  SpO2 96%   BMI 29.45 kg/m    Constitutional: well developed, well nourished Head: normocephalic, atraumatic Eyes: sclera anicteric, no redness Neck: supple Lungs: normal respiratory effort Neurology: alert and oriented Skin: dry, no appreciable rashes Musculoskeletal: no appreciable defects Psychiatric: normal mood and affect Diabetic Foot Exam - Simple   No data filed      Current Medications Patient's Medications  New Prescriptions   INSULIN  ASPART (FIASP ) 100 UNIT/ML FLEXTOUCH PEN    3 units fifteen min before each meal (take 4 units if blood sugar is more than 250)  Previous Medications   ARIPIPRAZOLE  (ABILIFY ) 10 MG TABLET    Take 1 tablet (10 mg total) by mouth daily.   ASPIRIN  EC 81 MG TABLET    Take 1 tablet (81  mg total) by mouth at bedtime. Swallow whole.   ATORVASTATIN  (LIPITOR ) 80 MG TABLET    Take 1 tablet (80 mg total) by mouth at bedtime.   CLOPIDOGREL  (PLAVIX ) 75 MG TABLET    Take 75 mg by mouth at bedtime.   CONTINUOUS GLUCOSE SENSOR (FREESTYLE LIBRE 3 SENSOR) MISC    by Does not apply route.   DOCUSATE SODIUM  (COLACE) 100 MG CAPSULE    Take 1 capsule (100 mg total) by mouth 2 (two) times daily.   EMBECTA PEN NEEDLE NANO 2 GEN 32G X 4 MM MISC    1 DEVICE BY DOES NOT APPLY ROUTE IN THE MORNING, AT NOON, IN THE EVENING, AND AT BEDTIME.   GLUCAGON  (GVOKE HYPOPEN  1-PACK) 1 MG/0.2ML SOAJ    Inject 1 mg into the skin as needed (low blood sugar with impaired consciousness).   INSULIN  DEGLUDEC (TRESIBA  FLEXTOUCH) 100 UNIT/ML FLEXTOUCH PEN    INJECT 14 UNITS INTO THE SKIN DAILY.   JARDIANCE  25 MG TABS TABLET    TAKE 1 TABLET BY MOUTH DAILY BEFORE BREAKFAST.   LATANOPROST  (XALATAN ) 0.005 % OPHTHALMIC SOLUTION    Place 1 drop into the left eye at bedtime.   LEVOTHYROXINE  (SYNTHROID ) 50 MCG TABLET    Take 1 tablet (50 mcg total) by mouth  daily before breakfast.   LORATADINE  (CLARITIN ) 10 MG TABLET    Take 10 mg by mouth daily as needed for allergies.   LOSARTAN  (COZAAR ) 25 MG TABLET    Take 25 mg by mouth daily.   PANTOPRAZOLE  (PROTONIX ) 40 MG TABLET    Take 1 tablet (40 mg total) by mouth at bedtime.   PREGABALIN  (LYRICA ) 75 MG CAPSULE    Take 1 capsule (75 mg total) by mouth 2 (two) times daily.   SEMAGLUTIDE , 2 MG/DOSE, (OZEMPIC , 2 MG/DOSE,) 8 MG/3ML SOPN    INJECT 2 MG AS DIRECTED ONCE A WEEK.   SERTRALINE  (ZOLOFT ) 50 MG TABLET    Take 3 tablets (150 mg total) by mouth at bedtime.  Modified Medications   No medications on file  Discontinued Medications   INSULIN  ASPART (NOVOLOG  FLEXPEN) 100 UNIT/ML FLEXPEN    Inject 3 Units into the skin 3 (three) times daily with meals. Up to 8 untis tida    Allergies No Known Allergies  Past Medical History Past Medical History:  Diagnosis Date   Acute kidney injury superimposed on CKD 03/15/2019   Acute respiratory failure with hypoxia (HCC) 03/15/2019   Adjustment disorder with mixed anxiety and depressed mood 03/11/2019   Anemia    Anxiety    Arthritis    right ankle   Blind    right eye   CHF (congestive heart failure) (HCC)    Closed right pilon fracture, initial encounter 07/26/2019   COPD (chronic obstructive pulmonary disease) (HCC)    Coronary artery disease    Depression    Diabetes mellitus type 2 in obese 07/26/2019   Diabetes mellitus without complication (HCC)    type 1   Diabetic polyneuropathy (HCC) 05/04/2016   Dyslipidemia 01/03/2020   Fever 03/10/2019   GERD (gastroesophageal reflux disease)    History of blood transfusion    HLD (hyperlipidemia)    Hyperglycemia    Hypertension    Hypothyroidism    Ileus, postoperative (HCC)    Major depression, recurrent 03/09/2019   MDD (major depressive disorder), severe (HCC) 03/09/2019   Myocardial infarct, old    2020, s/p DES RCA, RPDA 10/23/19   Neck pain 04/21/2016  Pneumonia 10/2019   Renal disorder    stage  3   Sepsis (HCC) 03/10/2019   Short-term memory loss    mild   Sleep apnea    study done 9/27 and 08/13/20 - no results yet 08/21/20   Stroke (HCC)    Suicidal ideation 03/15/2019   Type 1 diabetes mellitus without complication 03/15/2019   Wound infection after surgery (Right ankle) 10/04/2019    Past Surgical History Past Surgical History:  Procedure Laterality Date   ABDOMINAL HYSTERECTOMY  2014   ANKLE FUSION Right 08/23/2020   Procedure: ANKLE FUSION;  Surgeon: Kendal Franky SQUIBB, MD;  Location[facility];  Service: Orthopedics;  Laterality: Right;   APPLICATION OF WOUND VAC Right 10/11/2019   Procedure: WOUND VAC CHANGE TO RIGHT LEG;  Surgeon: Kendal Franky SQUIBB, MD;  Location: [facility];  Service: Orthopedics;  Laterality: Right;   CORONARY ANGIOPLASTY  10/23/2019   DES RCA, DES RPDA 10/23/19 [facility])   EYE SURGERY     HARDWARE REMOVAL Right 10/06/2019   Procedure: HARDWARE REMOVAL;  Surgeon: Kendal Franky SQUIBB, MD;  Location: [facility];  Service: Orthopedics;  Laterality: Right;   HARDWARE REMOVAL Right 08/23/2020   Procedure: HARDWARE REMOVAL;  Surgeon: Kendal Franky SQUIBB, MD;  [facility];  Service: Orthopedics;  Laterality: Right;   I & D EXTREMITY Right 10/06/2019   Procedure: IRRIGATION AND DEBRIDEMENT EXTREMITY;  Surgeon: Kendal Franky SQUIBB, MD;  [facility];  Service: Orthopedics;  Laterality: Right;   I & D EXTREMITY Right 10/09/2019   Procedure: IRRIGATION AND DEBRIDEMENT EXTREMITY and WOUND VAC CHANGE RIGHT ANKLE;  Surgeon: Kendal Franky SQUIBB, MD;  [facility];  Service: Orthopedics;  Laterality: Right;   ORIF ANKLE FRACTURE Right 07/26/2019   Procedure: OPEN REDUCTION INTERNAL FIXATION (ORIF) ANKLE FRACTURE;  Surgeon: Kendal Franky SQUIBB, MD;  [facility];  Service: Orthopedics;  Laterality: Right;  OPEN REDUCTION INTERNAL FIXATION (ORIF) ANKLE FRACTURE    TOE AMPUTATION      Family History family history includes Congestive Heart Failure in her father and maternal grandfather; Diabetes in  her sister; Diabetes Mellitus II in her mother; Heart attack in her mother; Stomach cancer in her father; Stroke in her mother.  Social History Social History   Socioeconomic History   Marital status: Divorced    Spouse name: Not on file   Number of children: Not on file   Years of education: Not on file   Highest education level: Not on file  Occupational History   Not on file  Tobacco Use   Smoking status: Never   Smokeless tobacco: Never  Vaping Use   Vaping status: Never Used  Substance and Sexual Activity   Alcohol  use: Not Currently   Drug use: Not Currently   Sexual activity: Not on file    Comment: Hysterectomy  Other Topics Concern   Not on file  Social History Narrative   Not on file   Social Drivers of Health   Financial Resource Strain: Not on file  Food Insecurity: No Food Insecurity (08/10/2023)   Hunger Vital Sign    Worried About Running Out of Food in the Last Year: Never true    Ran Out of Food in the Last Year: Never true  Transportation Needs: No Transportation Needs (08/10/2023)   PRAPARE - Administrator, Civil Service (Medical): No    Lack of Transportation (Non-Medical): No  Physical Activity: Not on file  Stress: Not on file  Social  Connections: Not on file  Intimate Partner Violence: Not At Risk (08/10/2023)   Humiliation, Afraid, Rape, and Kick questionnaire    Fear of Current or Ex-Partner: No    Emotionally Abused: No    Physically Abused: No    Sexually Abused: No    Lab Results  Component Value Date   HGBA1C 7.1 (A) 08/10/2024   HGBA1C 7.3 (A) 05/10/2024   HGBA1C 7.2 (A) 12/22/2023   Lab Results  Component Value Date   CHOL 131 07/20/2024   Lab Results  Component Value Date   HDL 58 07/20/2024   Lab Results  Component Value Date   LDLCALC 52 07/20/2024   Lab Results  Component Value Date   TRIG 124 07/20/2024   Lab Results  Component Value Date   CHOLHDL 2.3 07/20/2024   Lab Results  Component Value  Date   CREATININE 2.26 (H) 07/20/2024   Lab Results  Component Value Date   GFR 27.95 (L) 12/09/2021   Lab Results  Component Value Date   MICROALBUR 1.6 07/20/2024       Component Value Date/Time   NA 141 07/20/2024 0806   K 4.9 07/20/2024 0806   CL 105 07/20/2024 0806   CO2 30 07/20/2024 0806   GLUCOSE 131 (H) 07/20/2024 0806   BUN 44 (H) 07/20/2024 0806   CREATININE 2.26 (H) 07/20/2024 0806   CALCIUM  9.5 07/20/2024 0806   PROT 7.1 07/20/2024 0806   ALBUMIN 3.3 (L) 08/12/2023 1834   AST 14 07/20/2024 0806   ALT 9 07/20/2024 0806   ALKPHOS 122 08/12/2023 1834   BILITOT 0.3 07/20/2024 0806   GFRNONAA 33 (L) 08/18/2023 0633   GFRAA 31 (L) 07/12/2020 1356      Latest Ref Rng & Units 07/20/2024    8:06 AM 12/22/2023    1:56 PM 08/18/2023    6:33 AM  BMP  Glucose 65 - 99 mg/dL 868  752  619   BUN 7 - 25 mg/dL 44  26  33   Creatinine 0.50 - 1.03 mg/dL 7.73  8.07  8.15   BUN/Creat Ratio 6 - 22 (calc) 19  14    Sodium 135 - 146 mmol/L 141  141  135   Potassium 3.5 - 5.3 mmol/L 4.9  4.8  4.6   Chloride 98 - 110 mmol/L 105  102  100   CO2 20 - 32 mmol/L 30  27  26    Calcium  8.6 - 10.4 mg/dL 9.5  9.9  9.1        Component Value Date/Time   WBC 4.4 08/09/2023 1848   RBC 4.44 08/09/2023 1848   HGB 12.7 08/09/2023 1848   HCT 39.0 08/09/2023 1848   PLT 171 08/09/2023 1848   MCV 87.8 08/09/2023 1848   MCH 28.6 08/09/2023 1848   MCHC 32.6 08/09/2023 1848   RDW 13.5 08/09/2023 1848   LYMPHSABS 1.0 08/09/2023 1848   MONOABS 0.3 08/09/2023 1848   EOSABS 0.0 08/09/2023 1848   BASOSABS 0.0 08/09/2023 1848     Parts of this note may have been dictated using voice recognition software. There may be variances in spelling and vocabulary which are unintentional. Not all errors are proofread. Please notify the dino if any discrepancies are noted or if the meaning of any statement is not clear.

## 2024-08-14 ENCOUNTER — Encounter: Payer: Self-pay | Admitting: "Endocrinology

## 2024-08-30 ENCOUNTER — Ambulatory Visit: Admitting: Podiatry

## 2024-08-30 DIAGNOSIS — M722 Plantar fascial fibromatosis: Secondary | ICD-10-CM | POA: Diagnosis not present

## 2024-08-30 DIAGNOSIS — M79675 Pain in left toe(s): Secondary | ICD-10-CM

## 2024-08-30 DIAGNOSIS — B351 Tinea unguium: Secondary | ICD-10-CM

## 2024-08-30 DIAGNOSIS — M79674 Pain in right toe(s): Secondary | ICD-10-CM | POA: Diagnosis not present

## 2024-08-30 NOTE — Patient Instructions (Signed)

## 2024-08-30 NOTE — Progress Notes (Signed)
 Subjective:  Patient ID: Deanna Landry, female    DOB: 02-01-1971,  MRN: [id number]  Deanna Landry presents to clinic today for:  Chief Complaint  Patient presents with   [facility]    [facility] with callous A1c 7.3 in Sept ASA and Plavix    Patient notes nails are thick, discolored, elongated and painful in shoegear when trying to ambulate.  Patient states that she still has not gotten her diabetic shoes from [place].  She states that someone did call her and leave a message but they spoke about the paperwork and she did not understand and never followed up.  She is interested in resuming the shoe program if possible.  She is also noting some pain in the bottom of the right heel.  Denies injury or bruising.  States it feels fairly central.  PCP is [name], MD.  Past Medical History:  Diagnosis Date   Acute kidney injury superimposed on CKD 03/15/2019   Acute respiratory failure with hypoxia (HCC) 03/15/2019   Adjustment disorder with mixed anxiety and depressed mood 03/11/2019   Anemia    Anxiety    Arthritis    right ankle   Blind    right eye   CHF (congestive heart failure) (HCC)    Closed right pilon fracture, initial encounter 07/26/2019   COPD (chronic obstructive pulmonary disease) (HCC)    Coronary artery disease    Depression    Diabetes mellitus type 2 in obese 07/26/2019   Diabetes mellitus without complication (HCC)    type 1   Diabetic polyneuropathy (HCC) 05/04/2016   Dyslipidemia 01/03/2020   Fever 03/10/2019   GERD (gastroesophageal reflux disease)    History of blood transfusion    HLD (hyperlipidemia)    Hyperglycemia    Hypertension    Hypothyroidism    Ileus, postoperative (HCC)    Major depression, recurrent 03/09/2019   MDD (major depressive disorder), severe (HCC) 03/09/2019   Myocardial infarct, old    2020, s/p DES RCA, RPDA 10/23/19   Neck pain 04/21/2016   Pneumonia 10/2019   Renal disorder    stage 3   Sepsis (HCC) 03/10/2019   Short-term memory loss     mild   Sleep apnea    study done 9/27 and 08/13/20 - no results yet 08/21/20   Stroke (HCC)    Suicidal ideation 03/15/2019   Type 1 diabetes mellitus without complication 03/15/2019   Wound infection after surgery (Right ankle) 10/04/2019   Past Surgical History:  Procedure Laterality Date   ABDOMINAL HYSTERECTOMY  2014   ANKLE FUSION Right 08/23/2020   Procedure: ANKLE FUSION;  Surgeon: Kendal Franky SQUIBB, MD;  [facility];  Service: Orthopedics;  Laterality: Right;   APPLICATION OF WOUND VAC Right 10/11/2019   Procedure: WOUND VAC CHANGE TO RIGHT LEG;  Surgeon: Kendal Franky SQUIBB, MD;  [facility];  Service: Orthopedics;  Laterality: Right;   CORONARY ANGIOPLASTY  10/23/2019   DES RCA, DES RPDA 10/23/19 [facility])   EYE SURGERY     HARDWARE REMOVAL Right 10/06/2019   Procedure: HARDWARE REMOVAL;  Surgeon: Kendal Franky SQUIBB, MD;  [facility];  Service: Orthopedics;  Laterality: Right;   HARDWARE REMOVAL Right 08/23/2020   Procedure: HARDWARE REMOVAL;  Surgeon: Kendal Franky SQUIBB, MD;  [facility];  Service: Orthopedics;  Laterality: Right;   I & D EXTREMITY Right 10/06/2019   Procedure: IRRIGATION AND DEBRIDEMENT EXTREMITY;  Surgeon: Kendal Franky SQUIBB, MD;  [facility];  Service: Orthopedics;  Laterality: Right;   I & D EXTREMITY Right 10/09/2019   Procedure: IRRIGATION AND DEBRIDEMENT EXTREMITY and WOUND VAC CHANGE RIGHT ANKLE;  Surgeon: Kendal Franky SQUIBB, MD;  [facility];  Service: Orthopedics;  Laterality: Right;   ORIF ANKLE FRACTURE Right 07/26/2019   Procedure: OPEN REDUCTION INTERNAL FIXATION (ORIF) ANKLE FRACTURE;  Surgeon: Kendal Franky SQUIBB, MD;  [facility];  Service: Orthopedics;  Laterality: Right;  OPEN REDUCTION INTERNAL FIXATION (ORIF) ANKLE FRACTURE    TOE AMPUTATION     No Known Allergies  Review of Systems: Negative except as noted in the HPI.  Objective:  Deanna Landry is a pleasant 53 y.o. female in NAD. AAO x 3.  Vascular Examination: Capillary refill time is  3-5 seconds to toes bilateral. Palpable pedal pulses b/l LE. Digital hair present b/l.  Skin temperature gradient WNL b/l. No varicosities b/l. No cyanosis noted b/l.   Dermatological Examination: Pedal skin with normal turgor, texture and tone b/l. No open wounds. No interdigital macerations b/l. Toenails x10 are 3mm thick, discolored, dystrophic with subungual debris. There is pain with compression of the nail plates.  They are elongated x10  Orthopedic examination: There is pain on palpation to the plantar medial plantar central portions of the right heel.  The plantar fascia feels tight on palpation.  No gaps or nodules are noted.  No posterior heel pain is noted     Latest Ref Rng & Units 08/10/2024    3:25 PM 05/10/2024    9:01 AM 12/22/2023    1:27 PM 09/22/2023    3:22 PM  Hemoglobin A1C  Hemoglobin-A1c 4.0 - 5.6 % 7.1  7.3  7.2  6.2    Assessment/Plan: 1. Pain due to onychomycosis of toenails of both feet   2. Plantar fasciitis of right foot    The mycotic toenails were sharply debrided x10 with sterile nail nippers and a power debriding burr to decrease bulk/thickness and length.    Patient was given stretching exercises to start with.  She is legally blind, but states her family members at home can explain the stretching instructions to her.  There are illustrated poses but they can help show her how to do the exercises daily.  If she does not have any significant improvement within the next couple weeks, she was instructed to call the office for x-ray and evaluate for a more involved workup  Return in about 3 months (around 11/30/2024) for [facility].   Awanda CHARM Imperial, DPM, FACFAS [facility]     [address] [place], [place] [address]                Office [phone]  Fax [phone]

## 2024-09-18 ENCOUNTER — Encounter: Attending: "Endocrinology | Admitting: Dietician

## 2024-09-18 VITALS — Ht 62.0 in | Wt 166.0 lb

## 2024-09-18 DIAGNOSIS — E1069 Type 1 diabetes mellitus with other specified complication: Secondary | ICD-10-CM | POA: Insufficient documentation

## 2024-09-18 NOTE — Progress Notes (Signed)
 Diabetes Self-Management Education  Visit Type: First/Initial  Appt. Start Time: 1525 Appt. End Time: 1640  09/24/2024  [name], identified by name and date of birth, is a 53 y.o. female with a diagnosis of Diabetes: Type 1.   ASSESSMENT  Patient is here today alone.  Patient states that she was staying up all night with her mother and eating during the night.  She was last seen by another dietitian in 2022. She states that she would like to go on her diet and learn what she can and cannot eat for her diabetes and CKD. She states that she will drink Glucerna if she skips a meal or instead of Diet Mt. Dew. Patient is legally blind and counts the clicks on her insulin .  Her mother is no longer able to supervise her and is now on Hospice for dementia.    Referral:  Type 1 Diabetes - consistent carb of about 60 grams/meal and avoid diet soda.  History includes:  Type 1 Diabetes (1985), CKD stage 4, CHF, legally blind, HLD, HTN, COPD, MI, neuropathy, hypothyroid, CVA, OSA - no treatment, GERD, anxiety, depression, glaucoma, schizoaffective disorder Medications include:  Ozempic , Jardiance , Tresiba  7 units q am, Fiasp  3 units plus sliding scale - 3 units for a blood glucose more than 250 and 4 units for a blood sugar more than 300 (skip if blood glucose is less than 100 before a meal), levothyroxine , Lokelma  to decrease potassium Labs:  A1C 7.1% 08/10/2024 increased from 6.2% 12/22/2023 BUN:  44, Creatinine 2.26, Potassium 4.9, eGFR 27 09/18/2024, Vitamin B-12 249 on 08/19/2023, Vitamin D  10 on 10/06/2019 Sleep:  Good CGM:  Libre 3+ - current sensor reading 268, fasting glucose high as well as post meal.  CGM Results from download: 09/18/2024  % Time CGM active:   97%   (Goal >70%)  Average glucose:   176 mg/dL for 14 days  Glucose management indicator:    %  Time in range (70-180 mg/dL):   56 %   (Goal >29%)  Time High (181-250 mg/dL):   43 %   (Goal < 74%)  Time Very High (>250 mg/dL):     0 %   (Goal < 5%)  Time Low (54-69 mg/dL):   3 %   (Goal <5%)  Time Very Low (<54 mg/dL):   0 %   (Goal <8%)  %CV (glucose variability)     %  (Goal <36%)   62 166 lbs 09/18/2024 Patient reports that she has recently gained 20 lbs (since 06/2024)  Patient lives with her mother.  Her mother was just put on hospice for dementia. Patient shops with her aunt and does her own cooking.  Easy foods such as TV dinners and canned foods.  Her aunt Geofm will help cook at times and brings her to her appointments if her health insurance ride is unable to bring her. Mother and Wylie Geofm smoke. Avoids added salt. She was on a tandem pump in 2022 but had to stop using this due to poor vision.  Height 5' 2 (1.575 m), weight 166 lb (75.3 kg). Body mass index is 30.36 kg/m.   Diabetes Self-Management Education - 09/18/24 1543       Visit Information   Visit Type First/Initial      Initial Visit   Diabetes Type Type 1    Date Diagnosed 1984    Are you currently following a meal plan? No    Are you taking your medications as  prescribed? Yes      Health Coping   How would you rate your overall health? Poor      Psychosocial Assessment   Patient Belief/Attitude about Diabetes Defeat/Burnout   occasional denial   What is the hardest part about your diabetes right now, causing you the most concern, or is the most worrisome to you about your diabetes?   Making healty food and beverage choices    Self-care barriers Impaired vision;Debilitated state due to current medical condition    Self-management support Doctor's office    Other persons present Patient    Patient Concerns Nutrition/Meal planning    Special Needs None    Preferred Learning Style No preference indicated    Learning Readiness Ready    How often do you need to have someone help you when you read instructions, pamphlets, or other written materials from your doctor or pharmacy? 5 - Always    What is the last grade level you  completed in school? 12      Pre-Education Assessment   Patient understands the diabetes disease and treatment process. Needs Review    Patient understands incorporating nutritional management into lifestyle. Needs Review    Patient undertands incorporating physical activity into lifestyle. Needs Review    Patient understands using medications safely. Needs Review    Patient understands monitoring blood glucose, interpreting and using results Needs Review    Patient understands prevention, detection, and treatment of acute complications. Needs Review    Patient understands prevention, detection, and treatment of chronic complications. Needs Review    Patient understands how to develop strategies to address psychosocial issues. Needs Review    Patient understands how to develop strategies to promote health/change behavior. Needs Review      Complications   Last HgB A1C per patient/outside source 7.1 %   08/10/24 increased from 6.2% 12/2023   How often do you check your blood sugar? > 4 times/day    Fasting Blood glucose range (mg/dL) 869-820    Postprandial Blood glucose range (mg/dL) 869-820;819-799;>799    Number of hypoglycemic episodes per month 2    Can you tell when your blood sugar is low? Yes    What do you do if your blood sugar is low? drinks juice or regular soda    Number of hyperglycemic episodes ( >200mg /dL): Daily    Can you tell when your blood sugar is high? Yes    Have you had a dilated eye exam in the past 12 months? Yes    Have you had a dental exam in the past 12 months? Yes    Are you checking your feet? Yes    How many days per week are you checking your feet? 1      Dietary Intake   Breakfast none today OR instant grits, fried egg OR oatmeal    Snack (morning) none    Lunch none today    Snack (afternoon) none    Dinner fried fish, candied sweet potatoes, yellow squash, cabbage, 1 yeast roll    Snack (evening) fruit, cottage cheese    Beverage(s) water, 2-12 oz  soda daily      Activity / Exercise   Activity / Exercise Type ADL's    How many days per week do you exercise? 0    How many minutes per day do you exercise? 0    Total minutes per week of exercise 0      Patient Education   Previous Diabetes Education  Yes   2022   Disease Pathophysiology Explored patient's options for treatment of their diabetes    Healthy Eating Carbohydrate counting;Meal options for control of blood glucose level and chronic complications.    Medications Reviewed patients medication for diabetes, action, purpose, timing of dose and side effects.    Monitoring Identified appropriate SMBG and/or A1C goals.;Purpose and frequency of SMBG.;Taught/evaluated CGM (comment);Daily foot exams    Acute complications Taught prevention, symptoms, and  treatment of hypoglycemia - the 15 rule.    Diabetes Stress and Support Identified and addressed patients feelings and concerns about diabetes;Worked with patient to identify barriers to care and solutions      Individualized Goals (developed by patient)   Nutrition Carb counting;General guidelines for healthy choices and portions discussed    Medications take my medication as prescribed    Monitoring  Consistenly use CGM    Problem Solving Eating Pattern;Addressing barriers to behavior change    Reducing Risk examine blood glucose patterns;do foot checks daily;treat hypoglycemia with 15 grams of carbs if blood glucose less than 70mg /dL    Health Coping Discuss barriers to diabetes care with support person/system (comment specifics as needed)      Post-Education Assessment   Patient understands the diabetes disease and treatment process. Comprehends key points    Patient understands incorporating nutritional management into lifestyle. Needs Review    Patient undertands incorporating physical activity into lifestyle. Needs Review    Patient understands using medications safely. Needs Review    Patient understands monitoring blood  glucose, interpreting and using results Comprehends key points    Patient understands prevention, detection, and treatment of acute complications. Comprehends key points    Patient understands prevention, detection, and treatment of chronic complications. Comprehends key points    Patient understands how to develop strategies to address psychosocial issues. Needs Review    Patient understands how to develop strategies to promote health/change behavior. Needs Review      Outcomes   Expected Outcomes Demonstrated interest in learning but significant barriers to change    Future DMSE 2 months    Program Status Not Completed          Individualized Plan for Diabetes Self-Management Training:   Learning Objective:  Patient will have a greater understanding of diabetes self-management. Patient education plan is to attend individual and/or group sessions per assessed needs and concerns.   Plan:   Patient Instructions  Avoid skipping meals Take your Fiasp  before each meal.  When you eat, you are able to give a correction dose of insulin  to help lower your blood glucose. When you eat a meal, add your meal insulin  and your correction insulin  for the total dose.  Avoid processed meat Choose frozen vegetables and beans rather than canned or rinse the canned vegetables in water to reduce the salt content.  Drink  water OR unsweetened tea rather than diet soda  Expected Outcomes:  Demonstrated interest in learning but significant barriers to change  Education material provided: NK[employer] national kidney diet - Dish up a Kidney-Friendly Meal for Patients with Chronic Kidney Disease (not on dialysis)  If problems or questions, patient to contact team via:  Phone  Future DSME appointment: 2 months

## 2024-09-18 NOTE — Patient Instructions (Addendum)
 Avoid skipping meals Take your Fiasp  before each meal.  When you eat, you are able to give a correction dose of insulin  to help lower your blood glucose. When you eat a meal, add your meal insulin  and your correction insulin  for the total dose.  Avoid processed meat Choose frozen vegetables and beans rather than canned or rinse the canned vegetables in water to reduce the salt content.  Drink  water OR unsweetened tea rather than diet soda

## 2024-10-10 ENCOUNTER — Ambulatory Visit (INDEPENDENT_AMBULATORY_CARE_PROVIDER_SITE_OTHER): Admitting: "Endocrinology

## 2024-10-10 ENCOUNTER — Encounter: Payer: Self-pay | Admitting: "Endocrinology

## 2024-10-10 VITALS — BP 120/70 | HR 87 | Ht 62.0 in | Wt 166.0 lb

## 2024-10-10 DIAGNOSIS — Z7984 Long term (current) use of oral hypoglycemic drugs: Secondary | ICD-10-CM

## 2024-10-10 DIAGNOSIS — Z7985 Long-term (current) use of injectable non-insulin antidiabetic drugs: Secondary | ICD-10-CM | POA: Diagnosis not present

## 2024-10-10 DIAGNOSIS — Z794 Long term (current) use of insulin: Secondary | ICD-10-CM

## 2024-10-10 DIAGNOSIS — E11649 Type 2 diabetes mellitus with hypoglycemia without coma: Secondary | ICD-10-CM | POA: Diagnosis not present

## 2024-10-10 DIAGNOSIS — E7800 Pure hypercholesterolemia, unspecified: Secondary | ICD-10-CM

## 2024-10-10 MED ORDER — INSULIN ASPART (W/NIACINAMIDE) 100 UNIT/ML ~~LOC~~ SOPN: PEN_INJECTOR | SUBCUTANEOUS | 5 refills | Status: AC

## 2024-10-10 MED ORDER — GVOKE HYPOPEN 1-PACK 1 MG/0.2ML ~~LOC~~ SOAJ: 1.0000 mg | SUBCUTANEOUS | 2 refills | Status: DC | PRN

## 2024-10-10 MED ORDER — EMPAGLIFLOZIN 25 MG PO TABS: 25.0000 mg | ORAL_TABLET | Freq: Every day | ORAL | 1 refills | Status: AC

## 2024-10-10 MED ORDER — FREESTYLE LIBRE 3 SENSOR MISC: 1.0000 | 1 refills | Status: AC

## 2024-10-10 MED ORDER — TRESIBA FLEXTOUCH 100 UNIT/ML ~~LOC~~ SOPN: 7.0000 [IU] | PEN_INJECTOR | Freq: Every morning | SUBCUTANEOUS | 1 refills | Status: AC

## 2024-10-10 MED ORDER — INSULIN ASPART (W/NIACINAMIDE) 100 UNIT/ML ~~LOC~~ SOPN: PEN_INJECTOR | SUBCUTANEOUS | 5 refills | Status: DC

## 2024-10-10 NOTE — Progress Notes (Signed)
 Outpatient Endocrinology Note Deanna Birmingham, MD  10/10/24   Deanna Landry 11/08/1971 [id number]  Referring Provider: [name], MD Primary Care Provider: [name], MD Reason for consultation: Subjective   Assessment & Plan  Diagnoses and all orders for this visit:  Uncontrolled type 2 diabetes mellitus with hypoglycemia without coma (HCC) -     empagliflozin  (JARDIANCE ) 25 MG TABS tablet; Take 1 tablet (25 mg total) by mouth daily before breakfast. -     insulin  degludec (TRESIBA  FLEXTOUCH) 100 UNIT/ML FlexTouch Pen; Inject 7 Units into the skin in the morning. -     insulin  aspart (FIASP ) 100 UNIT/ML FlexTouch Pen; Inject 5-7 units before coffee, 3-5 units before lunch and 5 units before supper - 15 min before meals -     Glucagon  (GVOKE HYPOPEN  1-PACK) 1 MG/0.2ML SOAJ; Inject 1 mg into the skin as needed (low blood sugar with impaired consciousness). -     Continuous Glucose Sensor (FREESTYLE LIBRE 3 SENSOR) MISC; 1 Device by Does not apply route every 14 (fourteen) days.  Long term (current) use of oral hypoglycemic drugs  Long-term (current) use of injectable non-insulin  antidiabetic drugs  Long-term insulin  use (HCC)  Insulin  dose changed (HCC)  Pure hypercholesterolemia    Diabetes Type I complicated by MI, stroke, neuropathy  Lab Results  Component Value Date   GFR 27.95 (L) 12/09/2021   Hba1c goal less than 7, current Hba1c is  Lab Results  Component Value Date   HGBA1C 7.1 (A) 08/10/2024   Will recommend the following:  Will recommend the following:  7 units Tresiba  daily Humalog 5-7 units before coffee, 3-5 units before lunch and 3-5 units before supper - 15 min before meals  (skip if blood sugar is less than 100 before than meal): )  Ozempic  2 mg weekly Jardiance  25 mg every day  Previously, Has low BG when eating less, patient is trying to improve eating habits  Instructed to replace diet mountain dew with glucerna  Previsouly, discussed  dangers of hypoglycemia, patient reports being legally blind and counts the clicks for insulin , previously instructed patient and mom for supervised doses by mom, mom agreed  No known contraindications/side effects to any of above medications No history of MEN syndrome/medullary thyroid  cancer/pancreatitis or pancreatic cancer in self or family Glucagon  discussed and prescribed with refills on 09/09/23 Lives with mother  -Last LD and Tg are as follows: Lab Results  Component Value Date   LDLCALC 52 07/20/2024    Lab Results  Component Value Date   TRIG 124 07/20/2024   -On atorvastatin  80 mg every day (non fasting sample led to Tg 168) -Follow low fat diet and exercise   -Blood pressure goal <140/90 - Microalbumin/creatinine goal is < 30 -Last MA/Cr is as follows: Lab Results  Component Value Date   MICROALBUR 1.6 07/20/2024    -on ACE/ARB losartan  25 mg qd -diet changes including salt restriction -limit eating outside -counseled BP targets per standards of diabetes care -uncontrolled blood pressure can lead to retinopathy, nephropathy and cardiovascular and atherosclerotic heart disease  Reviewed and counseled on: -A1C target -Blood sugar targets -Complications of uncontrolled diabetes  -Checking blood sugar before meals and bedtime and bring log next visit -All medications with mechanism of action and side effects -Hypoglycemia management: rule of 15's, Glucagon  Emergency Kit and medical alert ID -low-carb low-fat plate-method diet -At least 20 minutes of physical activity per day -Annual dilated retinal eye exam and foot exam -compliance and follow  up needs -follow up as scheduled or earlier if problem gets worse  Call if blood sugar is less than 70 or consistently above 250    Take a 15 gm snack of carbohydrate at bedtime before you go to sleep if your blood sugar is less than 100.    If you are going to fast after midnight for a test or procedure, ask your  physician for instructions on how to reduce/decrease your insulin  dose.    Call if blood sugar is less than 70 or consistently above 250  -Treating a low sugar by rule of 15  (15 gms of sugar every 15 min until sugar is more than 70) If you feel your sugar is low, test your sugar to be sure If your sugar is low (less than 70), then take 15 grams of a fast acting Carbohydrate (3-4 glucose tablets or glucose gel or 4 ounces of juice or regular soda) Recheck your sugar 15 min after treating low to make sure it is more than 70 If sugar is still less than 70, treat again with 15 grams of carbohydrate          Don't drive the hour of hypoglycemia  If unconscious/unable to eat or drink by mouth, use glucagon  injection or nasal spray baqsimi and call 911. Can repeat again in 15 min if still unconscious.  Return in about 4 weeks (around 11/07/2024).   I have reviewed current medications, nurse's notes, allergies, vital signs, past medical and surgical history, family medical history, and social history for this encounter. Counseled patient on symptoms, examination findings, lab findings, imaging results, treatment decisions and monitoring and prognosis. The patient understood the recommendations and agrees with the treatment plan. All questions regarding treatment plan were fully answered.  Deanna Birmingham, MD  10/10/24   History of Present Illness Deanna Landry is a 52 y.o. year old female who presents for follow up of Type I diabetes mellitus.  Deanna Landry was first diagnosed in age 40.    Home diabetes regimen: 7 units Tresiba  daily Jardiance  25 mg every day  Ozempic  2 mg weekly Novolog  4-6 units tidac   Liked Trulicity  1.5 mg weekly better, but not covered by insurance per pt   Previous history:   She has been on insulin  since her diagnosis when she had significant symptomatic hypoglycemia. Has been on various types of insulin  regimens in the past but usually not followed by  endocrinologist The last 5 or 6 years she has been on Levemir  and NovoLog  Previous level of control is variable with A1c only inconsistently below 7  She was on the T-slim pump started on 03/04/21 Settings: Basal rates: 12 AM- 2 AM = 0.60, 2 AM-8 AM = 0.65, 8 AM-12 AM = 0.50 ISF: 1: 60 except from 2-8 AM, mealtime carbohydrate ratio 1:1 Target 120 Active insulin  5 hours  COMPLICATIONS +  MI/Stroke -  retinopathy +  neuropathy +  nephropathy  BLOOD SUGAR DATA CGM interpretation: At today's visit, we reviewed her CGM downloads. The full report is scanned in the media. Reviewing the CGM trends, BG are low in daytime mixed with some highs and high in morning.   Physical Exam  BP 120/70   Pulse 87   Ht 5' 2 (1.575 m)   Wt 166 lb (75.3 kg)   LMP  (LMP Unknown) Comment: hysterectomy 2014  SpO2 98%   BMI 30.36 kg/m    Constitutional: well developed, well nourished Head: normocephalic, atraumatic Eyes:  sclera anicteric, no redness Neck: supple Lungs: normal respiratory effort Neurology: alert and oriented Skin: dry, no appreciable rashes Musculoskeletal: no appreciable defects Psychiatric: normal mood and affect Diabetic Foot Exam - Simple   No data filed      Current Medications Patient's Medications  New Prescriptions   No medications on file  Previous Medications   ALPRAZOLAM  (XANAX ) 0.25 MG TABLET    Take 0.25 mg by mouth 2 (two) times daily as needed for anxiety.   ARIPIPRAZOLE  (ABILIFY ) 10 MG TABLET    Take 1 tablet (10 mg total) by mouth daily.   ASPIRIN  EC 81 MG TABLET    Take 1 tablet (81 mg total) by mouth at bedtime. Swallow whole.   ATORVASTATIN  (LIPITOR ) 80 MG TABLET    Take 1 tablet (80 mg total) by mouth at bedtime.   CLOPIDOGREL  (PLAVIX ) 75 MG TABLET    Take 75 mg by mouth at bedtime.   DOCUSATE SODIUM  (COLACE) 100 MG CAPSULE    Take 1 capsule (100 mg total) by mouth 2 (two) times daily.   EMBECTA PEN NEEDLE NANO 2 GEN 32G X 4 MM MISC    1 DEVICE BY DOES  NOT APPLY ROUTE IN THE MORNING, AT NOON, IN THE EVENING, AND AT BEDTIME.   GABAPENTIN (NEURONTIN) 300 MG CAPSULE    Take 300 mg by mouth 2 (two) times daily.   HYDROCODONE -ACETAMINOPHEN  (HYCET) 7.5-325 MG/15 ML SOLUTION    Take 10 mLs by mouth 4 (four) times daily as needed for moderate pain (pain score 4-6).   LATANOPROST  (XALATAN ) 0.005 % OPHTHALMIC SOLUTION    Place 1 drop into the left eye at bedtime.   LEVOTHYROXINE  (SYNTHROID ) 50 MCG TABLET    Take 1 tablet (50 mcg total) by mouth daily before breakfast.   LORATADINE  (CLARITIN ) 10 MG TABLET    Take 10 mg by mouth daily as needed for allergies.   LOSARTAN  (COZAAR ) 25 MG TABLET    Take 25 mg by mouth daily.   PANTOPRAZOLE  (PROTONIX ) 40 MG TABLET    Take 1 tablet (40 mg total) by mouth at bedtime.   SEMAGLUTIDE , 2 MG/DOSE, (OZEMPIC , 2 MG/DOSE,) 8 MG/3ML SOPN    Inject 2 mg into the skin once a week.   SERTRALINE  (ZOLOFT ) 50 MG TABLET    Take 3 tablets (150 mg total) by mouth at bedtime.   SODIUM ZIRCONIUM CYCLOSILICATE  (LOKELMA ) 10 G PACK PACKET    Take 10 g by mouth 2 (two) times a week.  Modified Medications   Modified Medication Previous Medication   CONTINUOUS GLUCOSE SENSOR (FREESTYLE LIBRE 3 SENSOR) MISC Continuous Glucose Sensor (FREESTYLE LIBRE 3 SENSOR) MISC      1 Device by Does not apply route every 14 (fourteen) days.    by Does not apply route.   EMPAGLIFLOZIN  (JARDIANCE ) 25 MG TABS TABLET JARDIANCE  25 MG TABS tablet      Take 1 tablet (25 mg total) by mouth daily before breakfast.    TAKE 1 TABLET BY MOUTH DAILY BEFORE BREAKFAST.   GLUCAGON  (GVOKE HYPOPEN  1-PACK) 1 MG/0.2ML SOAJ Glucagon  (GVOKE HYPOPEN  1-PACK) 1 MG/0.2ML SOAJ      Inject 1 mg into the skin as needed (low blood sugar with impaired consciousness).    Inject 1 mg into the skin as needed (low blood sugar with impaired consciousness).   INSULIN  ASPART (FIASP ) 100 UNIT/ML FLEXTOUCH PEN insulin  aspart (FIASP ) 100 UNIT/ML FlexTouch Pen      Inject 5-7 units before  coffee, 3-5 units  before lunch and 5 units before supper - 15 min before meals    3 units fifteen min before each meal (take 4 units if blood sugar is more than 250)   INSULIN  DEGLUDEC (TRESIBA  FLEXTOUCH) 100 UNIT/ML FLEXTOUCH PEN insulin  degludec (TRESIBA  FLEXTOUCH) 100 UNIT/ML FlexTouch Pen      Inject 7 Units into the skin in the morning.    INJECT 14 UNITS INTO THE SKIN DAILY.  Discontinued Medications   No medications on file    Allergies No Known Allergies  Past Medical History Past Medical History:  Diagnosis Date   Acute kidney injury superimposed on CKD 03/15/2019   Acute respiratory failure with hypoxia (HCC) 03/15/2019   Adjustment disorder with mixed anxiety and depressed mood 03/11/2019   Anemia    Anxiety    Arthritis    right ankle   Blind    right eye   CHF (congestive heart failure) (HCC)    Closed right pilon fracture, initial encounter 07/26/2019   COPD (chronic obstructive pulmonary disease) (HCC)    Coronary artery disease    Depression    Diabetes mellitus type 2 in obese 07/26/2019   Diabetes mellitus without complication (HCC)    type 1   Diabetic polyneuropathy (HCC) 05/04/2016   Dyslipidemia 01/03/2020   Fever 03/10/2019   GERD (gastroesophageal reflux disease)    History of blood transfusion    HLD (hyperlipidemia)    Hyperglycemia    Hypertension    Hypothyroidism    Ileus, postoperative (HCC)    Major depression, recurrent 03/09/2019   MDD (major depressive disorder), severe (HCC) 03/09/2019   Myocardial infarct, old    2020, s/p DES RCA, RPDA 10/23/19   Neck pain 04/21/2016   Pneumonia 10/2019   Renal disorder    stage 3   Sepsis (HCC) 03/10/2019   Short-term memory loss    mild   Sleep apnea    study done 9/27 and 08/13/20 - no results yet 08/21/20   Stroke (HCC)    Suicidal ideation 03/15/2019   Type 1 diabetes mellitus without complication 03/15/2019   Wound infection after surgery (Right ankle) 10/04/2019    Past Surgical History Past Surgical  History:  Procedure Laterality Date   ABDOMINAL HYSTERECTOMY  2014   ANKLE FUSION Right 08/23/2020   Procedure: ANKLE FUSION;  Surgeon: Kendal Franky SQUIBB, MD;  Location: [facility] OR;  Service: Orthopedics;  Laterality: Right;   APPLICATION OF WOUND VAC Right 10/11/2019   Procedure: WOUND VAC CHANGE TO RIGHT LEG;  Surgeon: Kendal Franky SQUIBB, MD;  Location: [facility] OR;  Service: Orthopedics;  Laterality: Right;   CORONARY ANGIOPLASTY  10/23/2019   DES RCA, DES RPDA 10/23/19 [facility])   EYE SURGERY     HARDWARE REMOVAL Right 10/06/2019   Procedure: HARDWARE REMOVAL;  Surgeon: Kendal Franky SQUIBB, MD;  Location: [facility] OR;  Service: Orthopedics;  Laterality: Right;   HARDWARE REMOVAL Right 08/23/2020   Procedure: HARDWARE REMOVAL;  Surgeon: Kendal Franky SQUIBB, MD;  Location: [facility] OR;  Service: Orthopedics;  Laterality: Right;   I & D EXTREMITY Right 10/06/2019   Procedure: IRRIGATION AND DEBRIDEMENT EXTREMITY;  Surgeon: Kendal Franky SQUIBB, MD;  Location: [facility] OR;  Service: Orthopedics;  Laterality: Right;   I & D EXTREMITY Right 10/09/2019   Procedure: IRRIGATION AND DEBRIDEMENT EXTREMITY and WOUND VAC CHANGE RIGHT ANKLE;  Surgeon: Kendal Franky SQUIBB, MD;  Location: [facility];  Service: Orthopedics;  Laterality: Right;   ORIF ANKLE FRACTURE Right 07/26/2019   Procedure: OPEN  REDUCTION INTERNAL FIXATION (ORIF) ANKLE FRACTURE;  Surgeon: Kendal Franky SQUIBB, MD;  Location: [facility] OR;  Service: Orthopedics;  Laterality: Right;  OPEN REDUCTION INTERNAL FIXATION (ORIF) ANKLE FRACTURE    TOE AMPUTATION      Family History family history includes Congestive Heart Failure in her father and maternal grandfather; Diabetes in her sister; Diabetes Mellitus II in her mother; Heart attack in her mother; Stomach cancer in her father; Stroke in her mother.  Social History Social History   Socioeconomic History   Marital status: Divorced    Spouse name: Not on file   Number of children: Not on file   Years of education: Not on file   Highest education level:  Not on file  Occupational History   Not on file  Tobacco Use   Smoking status: Never   Smokeless tobacco: Never  Vaping Use   Vaping status: Never Used  Substance and Sexual Activity   Alcohol  use: Not Currently   Drug use: Not Currently   Sexual activity: Not on file    Comment: Hysterectomy  Other Topics Concern   Not on file  Social History Narrative   Not on file   Social Drivers of Health   Financial Resource Strain: Not on file  Food Insecurity: No Food Insecurity (08/10/2023)   Hunger Vital Sign    Worried About Running Out of Food in the Last Year: Never true    Ran Out of Food in the Last Year: Never true  Transportation Needs: No Transportation Needs (08/10/2023)   PRAPARE - Administrator, Civil Service (Medical): No    Lack of Transportation (Non-Medical): No  Physical Activity: Not on file  Stress: Not on file  Social Connections: Not on file  Intimate Partner Violence: Not At Risk (08/10/2023)   Humiliation, Afraid, Rape, and Kick questionnaire    Fear of Current or Ex-Partner: No    Emotionally Abused: No    Physically Abused: No    Sexually Abused: No    Lab Results  Component Value Date   HGBA1C 7.1 (A) 08/10/2024   HGBA1C 7.3 (A) 05/10/2024   HGBA1C 7.2 (A) 12/22/2023   Lab Results  Component Value Date   CHOL 131 07/20/2024   Lab Results  Component Value Date   HDL 58 07/20/2024   Lab Results  Component Value Date   LDLCALC 52 07/20/2024   Lab Results  Component Value Date   TRIG 124 07/20/2024   Lab Results  Component Value Date   CHOLHDL 2.3 07/20/2024   Lab Results  Component Value Date   CREATININE 2.26 (H) 07/20/2024   Lab Results  Component Value Date   GFR 27.95 (L) 12/09/2021   Lab Results  Component Value Date   MICROALBUR 1.6 07/20/2024       Component Value Date/Time   NA 141 07/20/2024 0806   K 4.9 07/20/2024 0806   CL 105 07/20/2024 0806   CO2 30 07/20/2024 0806   GLUCOSE 131 (H) 07/20/2024  0806   BUN 44 (H) 07/20/2024 0806   CREATININE 2.26 (H) 07/20/2024 0806   CALCIUM  9.5 07/20/2024 0806   PROT 7.1 07/20/2024 0806   ALBUMIN 3.3 (L) 08/12/2023 1834   AST 14 07/20/2024 0806   ALT 9 07/20/2024 0806   ALKPHOS 122 08/12/2023 1834   BILITOT 0.3 07/20/2024 0806   GFRNONAA 33 (L) 08/18/2023 0633   GFRAA 31 (L) 07/12/2020 1356      Latest Ref Rng & Units 07/20/2024  8:06 AM 12/22/2023    1:56 PM 08/18/2023    6:33 AM  BMP  Glucose 65 - 99 mg/dL 868  752  619   BUN 7 - 25 mg/dL 44  26  33   Creatinine 0.50 - 1.03 mg/dL 7.73  8.07  8.15   BUN/Creat Ratio 6 - 22 (calc) 19  14    Sodium 135 - 146 mmol/L 141  141  135   Potassium 3.5 - 5.3 mmol/L 4.9  4.8  4.6   Chloride 98 - 110 mmol/L 105  102  100   CO2 20 - 32 mmol/L 30  27  26    Calcium  8.6 - 10.4 mg/dL 9.5  9.9  9.1        Component Value Date/Time   WBC 4.4 08/09/2023 1848   RBC 4.44 08/09/2023 1848   HGB 12.7 08/09/2023 1848   HCT 39.0 08/09/2023 1848   PLT 171 08/09/2023 1848   MCV 87.8 08/09/2023 1848   MCH 28.6 08/09/2023 1848   MCHC 32.6 08/09/2023 1848   RDW 13.5 08/09/2023 1848   LYMPHSABS 1.0 08/09/2023 1848   MONOABS 0.3 08/09/2023 1848   EOSABS 0.0 08/09/2023 1848   BASOSABS 0.0 08/09/2023 1848     Parts of this note may have been dictated using voice recognition software. There may be variances in spelling and vocabulary which are unintentional. Not all errors are proofread. Please notify the dino if any discrepancies are noted or if the meaning of any statement is not clear.

## 2024-10-10 NOTE — Patient Instructions (Addendum)
 Will recommend the following:  7 units Tresiba  daily Humalog 5-7 units before coffee, 3-5 units before lunch and 3-5 units before supper - 15 min before meals  (skip if blood sugar is less than 100 before than meal): )  Ozempic  2 mg weekly Jardiance  25 mg every day

## 2024-11-07 ENCOUNTER — Ambulatory Visit: Admitting: "Endocrinology

## 2024-11-20 ENCOUNTER — Encounter: Admitting: Dietician

## 2024-11-29 ENCOUNTER — Ambulatory Visit (INDEPENDENT_AMBULATORY_CARE_PROVIDER_SITE_OTHER): Admitting: "Endocrinology

## 2024-11-29 ENCOUNTER — Ambulatory Visit: Admitting: Podiatry

## 2024-11-29 ENCOUNTER — Encounter: Payer: Self-pay | Admitting: "Endocrinology

## 2024-11-29 VITALS — BP 120/80 | HR 84 | Ht 62.0 in | Wt 167.0 lb

## 2024-11-29 DIAGNOSIS — E1042 Type 1 diabetes mellitus with diabetic polyneuropathy: Secondary | ICD-10-CM

## 2024-11-29 DIAGNOSIS — Z794 Long term (current) use of insulin: Secondary | ICD-10-CM

## 2024-11-29 DIAGNOSIS — E7800 Pure hypercholesterolemia, unspecified: Secondary | ICD-10-CM

## 2024-11-29 DIAGNOSIS — E11649 Type 2 diabetes mellitus with hypoglycemia without coma: Secondary | ICD-10-CM | POA: Diagnosis not present

## 2024-11-29 DIAGNOSIS — L84 Corns and callosities: Secondary | ICD-10-CM

## 2024-11-29 DIAGNOSIS — M79675 Pain in left toe(s): Secondary | ICD-10-CM | POA: Diagnosis not present

## 2024-11-29 DIAGNOSIS — Z7984 Long term (current) use of oral hypoglycemic drugs: Secondary | ICD-10-CM

## 2024-11-29 DIAGNOSIS — Z89421 Acquired absence of other right toe(s): Secondary | ICD-10-CM | POA: Diagnosis not present

## 2024-11-29 DIAGNOSIS — M79674 Pain in right toe(s): Secondary | ICD-10-CM | POA: Diagnosis not present

## 2024-11-29 DIAGNOSIS — B351 Tinea unguium: Secondary | ICD-10-CM | POA: Diagnosis not present

## 2024-11-29 MED ORDER — GVOKE HYPOPEN 1-PACK 1 MG/0.2ML ~~LOC~~ SOAJ: 1.0000 mg | SUBCUTANEOUS | 2 refills | Status: AC | PRN

## 2024-11-29 NOTE — Patient Instructions (Addendum)
 Will recommend the following:  Ozempic  2 mg weekly Jardiance  25 mg every day  6 units Tresiba  daily Humalog 6 units before coffee, 3-4 units before lunch and 3-4 units before supper - 15 min before meals  (skip if blood sugar is less than 100 before than meal) Humalog correction scale: Use in addition to your meal time/short acting insulin  based on blood sugars as follows:  151 - 200: 1 unit 201 - 250: 2 units 251 - 300: 3 units 301 - 350: 4 units 351 - 400: 5 units   Cut down humalog to half if eating only half a meal or if blood sugar is between 71-100 before a meal Skip short acting insulin  if blood sugar is less than 70 and treat with 15 gms of carbohydrates every 15 min until blood sugar is more than 100

## 2024-11-29 NOTE — Progress Notes (Signed)
 "   Subjective:  Patient ID: Deanna Landry, female    DOB: 13-May-1971,  MRN: [id number]  Karah D Krawiec presents to clinic today for:  Chief Complaint  Patient presents with   [facility]    [facility] with callus A1c 7.1 in Sept, had a recheck last week.  Plavix  and ASA   Patient notes nails are thick and elongated, causing pain in shoe gear when ambulating.  She has painful corns and calluses.  She did finally get her new diabetic shoes from [employer] towards the end of December and states that she really likes them and they have been helping her walk and helping relieve her right ankle pain  PCP is [name], MD. last seen around 09/06/2024  Past Medical History:  Diagnosis Date   Acute kidney injury superimposed on CKD 03/15/2019   Acute respiratory failure with hypoxia (HCC) 03/15/2019   Adjustment disorder with mixed anxiety and depressed mood 03/11/2019   Anemia    Anxiety    Arthritis    right ankle   Blind    right eye   CHF (congestive heart failure) (HCC)    Closed right pilon fracture, initial encounter 07/26/2019   COPD (chronic obstructive pulmonary disease) (HCC)    Coronary artery disease    Depression    Diabetes mellitus type 2 in obese 07/26/2019   Diabetes mellitus without complication (HCC)    type 1   Diabetic polyneuropathy (HCC) 05/04/2016   Dyslipidemia 01/03/2020   Fever 03/10/2019   GERD (gastroesophageal reflux disease)    History of blood transfusion    HLD (hyperlipidemia)    Hyperglycemia    Hypertension    Hypothyroidism    Ileus, postoperative (HCC)    Major depression, recurrent 03/09/2019   MDD (major depressive disorder), severe (HCC) 03/09/2019   Myocardial infarct, old    2020, s/p DES RCA, RPDA 10/23/19   Neck pain 04/21/2016   Pneumonia 10/2019   Renal disorder    stage 3   Sepsis (HCC) 03/10/2019   Short-term memory loss    mild   Sleep apnea    study done 9/27 and 08/13/20 - no results yet 08/21/20   Stroke (HCC)    Suicidal ideation 03/15/2019   Type 1  diabetes mellitus without complication 03/15/2019   Wound infection after surgery (Right ankle) 10/04/2019   Allergies[1]  Objective:  Pasha D Fuentes is a pleasant 54 y.o. female in NAD. AAO x 3.  Vascular Examination: Patient has palpable DP pulse, absent PT pulse bilateral.  Delayed capillary refill bilateral toes.  Sparse digital hair bilateral.  Proximal to distal cooling WNL bilateral.    Dermatological Examination: Interspaces are clear with no open lesions noted bilateral.  Skin is shiny and atrophic bilateral.  Nails are 3-58mm thick, with yellowish/brown discoloration, subungual debris and distal onycholysis x9  There is pain with compression of nails x9.  There are hyperkeratotic lesions noted bilateral submet 5 and plantar medial left hallux IPJ.     Latest Ref Rng & Units 08/10/2024    3:25 PM 05/10/2024    9:01 AM 12/22/2023    1:27 PM  Hemoglobin A1C  Hemoglobin-A1c 4.0 - 5.6 % 7.1  7.3  7.2    Patient qualifies for at-risk foot care because of previous right second toe amputation.  Assessment/Plan: 1. Pain due to onychomycosis of toenails of both feet   2. History of amputation of lesser toe of right foot   3. Type 1 diabetes mellitus with  peripheral neuropathy (HCC)   4. Callus of foot     Mycotic nails x 9 were sharply debrided with sterile nail nippers and power debriding burr to decrease bulk and length.  After the patient reviewed and signed the Medicaid ABN for callus shaving, the hyperkeratotic lesions submet 5 and plantar medial aspect of the left hallux IPJ were shaved with #[id number] blade.  2 of the 3 lesions are proximal to the toe DIPJ's  Return in about 3 months (around 02/27/2025).   Awanda CHARM Imperial, DPM, FACFAS [facility]     [address] [place], [place] [address]                Office [phone]  Fax [phone]    [1] No Known Allergies  "

## 2024-11-29 NOTE — Progress Notes (Signed)
 "   Outpatient Endocrinology Note Deanna Birmingham, MD  11/29/2024   Deanna Landry [birth year] [id number]  Referring Provider: [name], MD Primary Care Provider: [name], MD Reason for consultation: Subjective   Assessment & Plan  Diagnoses and all orders for this visit:  Uncontrolled type 2 diabetes mellitus with hypoglycemia without coma (HCC) -     POCT glycosylated hemoglobin (Hb A1C) -     Glucagon  (GVOKE HYPOPEN  1-PACK) 1 MG/0.2ML SOAJ; Inject 1 mg into the skin as needed (low blood sugar with impaired consciousness).  Long term (current) use of oral hypoglycemic drugs  Long-term insulin  use (HCC)  Insulin  dose changed (HCC)  Pure hypercholesterolemia   Diabetes Type I complicated by MI, stroke, neuropathy  Lab Results  Component Value Date   GFR 27.95 (L) 12/09/2021   Hba1c goal less than 7, current Hba1c is  Lab Results  Component Value Date   HGBA1C 7.1 (A) 08/10/2024   Will recommend the following:  Ozempic  2 mg weekly Jardiance  25 mg every day  6 units Tresiba  daily Humalog 6 units before coffee, 3-4 units before lunch and 3-4 units before supper - 15 min before meals  (skip if blood sugar is less than 100 before than meal) Humalog correction scale: Use in addition to your meal time/short acting insulin  based on blood sugars as follows: 151 - 200: 1 unit 201 - 250: 2 units 251 - 300: 3 units 301 - 350: 4 units 351 - 400: 5 units   Cut down humalog to half if eating only half a meal or if blood sugar is between 71-100 before a meal Skip short acting insulin  if blood sugar is less than 70 and treat with 15 gms of carbohydrates every 15 min until blood sugar is more than 100   Previously, Has low BG when eating less, patient is trying to improve eating habits  Instructed to replace diet mountain dew with glucerna  Previsouly, discussed dangers of hypoglycemia, patient reports being legally blind and counts the clicks for insulin , previously  instructed patient and mom for supervised doses by mom, mom agreed  No known contraindications/side effects to any of above medications No history of MEN syndrome/medullary thyroid  cancer/pancreatitis or pancreatic cancer in self or family Glucagon  discussed and prescribed with refills on 09/09/23 Lives with mother  -Last LD and Tg are as follows: Lab Results  Component Value Date   LDLCALC 52 07/20/2024    Lab Results  Component Value Date   TRIG 124 07/20/2024   -On atorvastatin  80 mg every day (non fasting sample led to Tg 168) -Follow low fat diet and exercise   -Blood pressure goal <140/90 - Microalbumin/creatinine goal is < 30 -Last MA/Cr is as follows: Lab Results  Component Value Date   MICROALBUR 1.6 07/20/2024    -on ACE/ARB losartan  25 mg qd -diet changes including salt restriction -limit eating outside -counseled BP targets per standards of diabetes care -uncontrolled blood pressure can lead to retinopathy, nephropathy and cardiovascular and atherosclerotic heart disease  Reviewed and counseled on: -A1C target -Blood sugar targets -Complications of uncontrolled diabetes  -Checking blood sugar before meals and bedtime and bring log next visit -All medications with mechanism of action and side effects -Hypoglycemia management: rule of 15's, Glucagon  Emergency Kit and medical alert ID -low-carb low-fat plate-method diet -At least 20 minutes of physical activity per day -Annual dilated retinal eye exam and foot exam -compliance and follow up needs -follow up as scheduled or earlier if  problem gets worse  Call if blood sugar is less than 70 or consistently above 250    Take a 15 gm snack of carbohydrate at bedtime before you go to sleep if your blood sugar is less than 100.    If you are going to fast after midnight for a test or procedure, ask your physician for instructions on how to reduce/decrease your insulin  dose.    Call if blood sugar is less than 70  or consistently above 250  -Treating a low sugar by rule of 15  (15 gms of sugar every 15 min until sugar is more than 70) If you feel your sugar is low, test your sugar to be sure If your sugar is low (less than 70), then take 15 grams of a fast acting Carbohydrate (3-4 glucose tablets or glucose gel or 4 ounces of juice or regular soda) Recheck your sugar 15 min after treating low to make sure it is more than 70 If sugar is still less than 70, treat again with 15 grams of carbohydrate          Don't drive the hour of hypoglycemia  If unconscious/unable to eat or drink by mouth, use glucagon  injection or nasal spray baqsimi and call 911. Can repeat again in 15 min if still unconscious.  Return in about 3 months (around 02/27/2025).   I have reviewed current medications, nurse's notes, allergies, vital signs, past medical and surgical history, family medical history, and social history for this encounter. Counseled patient on symptoms, examination findings, lab findings, imaging results, treatment decisions and monitoring and prognosis. The patient understood the recommendations and agrees with the treatment plan. All questions regarding treatment plan were fully answered.  Deanna Birmingham, MD  11/29/2024   History of Present Illness Deanna Landry is a 54 y.o. year old female who presents for follow up of Type I diabetes mellitus.  Deanna Landry was first diagnosed in age 24.    Home diabetes regimen: 6 units Tresiba  daily Humalog 6 units before coffee, 3-4 units before lunch and 3-4 units before supper - 15 min before meals  (skip if blood sugar is less than 100 before than meal): )  Jardiance  25 mg every day  Liked Trulicity  1.5 mg weekly better, but not covered by insurance per pt   Previous history:   She has been on insulin  since her diagnosis when she had significant symptomatic hypoglycemia. Has been on various types of insulin  regimens in the past but usually not followed by  endocrinologist The last 5 or 6 years she has been on Levemir  and NovoLog  Previous level of control is variable with A1c only inconsistently below 7  She was on the T-slim pump started on 03/04/21 Settings: Basal rates: 12 AM- 2 AM = 0.60, 2 AM-8 AM = 0.65, 8 AM-12 AM = 0.50 ISF: 1: 60 except from 2-8 AM, mealtime carbohydrate ratio 1:1 Target 120 Active insulin  5 hours  COMPLICATIONS +  MI/Stroke -  retinopathy +  neuropathy +  nephropathy  BLOOD SUGAR DATA CGM interpretation: At today's visit, we reviewed her CGM downloads. The full report is scanned in the media. Reviewing the CGM trends, BG are low 5% of the time (not always accurate as pt verifies with BG meter) and high 31% of the time, with highs between 8am-10am and a lot of variability.   Physical Exam  BP 120/80   Pulse 84   Ht 5' 2 (1.575 m)   Wt 167  lb (75.8 kg)   LMP  (LMP Unknown) Comment: hysterectomy 2014  SpO2 95%   BMI 30.54 kg/m    Constitutional: well developed, well nourished Head: normocephalic, atraumatic Eyes: sclera anicteric, no redness Neck: supple Lungs: normal respiratory effort Neurology: alert and oriented Skin: dry, no appreciable rashes Musculoskeletal: no appreciable defects Psychiatric: normal mood and affect Diabetic Foot Exam - Simple   No data filed      Current Medications Patient's Medications  New Prescriptions   No medications on file  Previous Medications   ALPRAZOLAM  (XANAX ) 0.25 MG TABLET    Take 0.25 mg by mouth 2 (two) times daily as needed for anxiety.   ARIPIPRAZOLE  (ABILIFY ) 10 MG TABLET    Take 1 tablet (10 mg total) by mouth daily.   ASPIRIN  EC 81 MG TABLET    Take 1 tablet (81 mg total) by mouth at bedtime. Swallow whole.   ATORVASTATIN  (LIPITOR ) 80 MG TABLET    Take 1 tablet (80 mg total) by mouth at bedtime.   CLOPIDOGREL  (PLAVIX ) 75 MG TABLET    Take 75 mg by mouth at bedtime.   CONTINUOUS GLUCOSE SENSOR (FREESTYLE LIBRE 3 SENSOR) MISC    1 Device by Does not  apply route every 14 (fourteen) days.   DOCUSATE SODIUM  (COLACE) 100 MG CAPSULE    Take 1 capsule (100 mg total) by mouth 2 (two) times daily.   EMBECTA PEN NEEDLE NANO 2 GEN 32G X 4 MM MISC    1 DEVICE BY DOES NOT APPLY ROUTE IN THE MORNING, AT NOON, IN THE EVENING, AND AT BEDTIME.   EMPAGLIFLOZIN  (JARDIANCE ) 25 MG TABS TABLET    Take 1 tablet (25 mg total) by mouth daily before breakfast.   GABAPENTIN (NEURONTIN) 300 MG CAPSULE    Take 300 mg by mouth 2 (two) times daily.   HYDROCODONE -ACETAMINOPHEN  (HYCET) 7.5-325 MG/15 ML SOLUTION    Take 10 mLs by mouth 4 (four) times daily as needed for moderate pain (pain score 4-6).   INSULIN  ASPART (FIASP ) 100 UNIT/ML FLEXTOUCH PEN    Inject 5-7 units before coffee, 3-5 units before lunch and 3-5 units before supper - 15 min before meals   INSULIN  DEGLUDEC (TRESIBA  FLEXTOUCH) 100 UNIT/ML FLEXTOUCH PEN    Inject 7 Units into the skin in the morning.   LATANOPROST  (XALATAN ) 0.005 % OPHTHALMIC SOLUTION    Place 1 drop into the left eye at bedtime.   LEVOTHYROXINE  (SYNTHROID ) 50 MCG TABLET    Take 1 tablet (50 mcg total) by mouth daily before breakfast.   LORATADINE  (CLARITIN ) 10 MG TABLET    Take 10 mg by mouth daily as needed for allergies.   LOSARTAN  (COZAAR ) 25 MG TABLET    Take 25 mg by mouth daily.   PANTOPRAZOLE  (PROTONIX ) 40 MG TABLET    Take 1 tablet (40 mg total) by mouth at bedtime.   SEMAGLUTIDE , 2 MG/DOSE, (OZEMPIC , 2 MG/DOSE,) 8 MG/3ML SOPN    Inject 2 mg into the skin once a week.   SERTRALINE  (ZOLOFT ) 50 MG TABLET    Take 3 tablets (150 mg total) by mouth at bedtime.   SODIUM ZIRCONIUM CYCLOSILICATE  (LOKELMA ) 10 G PACK PACKET    Take 10 g by mouth 2 (two) times a week.  Modified Medications   Modified Medication Previous Medication   GLUCAGON  (GVOKE HYPOPEN  1-PACK) 1 MG/0.2ML SOAJ Glucagon  (GVOKE HYPOPEN  1-PACK) 1 MG/0.2ML SOAJ      Inject 1 mg into the skin as needed (low blood sugar  with impaired consciousness).    Inject 1 mg into the skin  as needed (low blood sugar with impaired consciousness).  Discontinued Medications   No medications on file    Allergies No Known Allergies  Past Medical History Past Medical History:  Diagnosis Date   Acute kidney injury superimposed on CKD 03/15/2019   Acute respiratory failure with hypoxia (HCC) 03/15/2019   Adjustment disorder with mixed anxiety and depressed mood 03/11/2019   Anemia    Anxiety    Arthritis    right ankle   Blind    right eye   CHF (congestive heart failure) (HCC)    Closed right pilon fracture, initial encounter 07/26/2019   COPD (chronic obstructive pulmonary disease) (HCC)    Coronary artery disease    Depression    Diabetes mellitus type 2 in obese 07/26/2019   Diabetes mellitus without complication (HCC)    type 1   Diabetic polyneuropathy (HCC) 05/04/2016   Dyslipidemia 01/03/2020   Fever 03/10/2019   GERD (gastroesophageal reflux disease)    History of blood transfusion    HLD (hyperlipidemia)    Hyperglycemia    Hypertension    Hypothyroidism    Ileus, postoperative (HCC)    Major depression, recurrent 03/09/2019   MDD (major depressive disorder), severe (HCC) 03/09/2019   Myocardial infarct, old    2020, s/p DES RCA, RPDA 10/23/19   Neck pain 04/21/2016   Pneumonia 10/2019   Renal disorder    stage 3   Sepsis (HCC) 03/10/2019   Short-term memory loss    mild   Sleep apnea    study done 9/27 and 08/13/20 - no results yet 08/21/20   Stroke (HCC)    Suicidal ideation 03/15/2019   Type 1 diabetes mellitus without complication 03/15/2019   Wound infection after surgery (Right ankle) 10/04/2019    Past Surgical History Past Surgical History:  Procedure Laterality Date   ABDOMINAL HYSTERECTOMY  2014   ANKLE FUSION Right 08/23/2020   Procedure: ANKLE FUSION;  Surgeon: Kendal Franky SQUIBB, MD;  Location: [facility] OR;  Service: Orthopedics;  Laterality: Right;   APPLICATION OF WOUND VAC Right 10/11/2019   Procedure: WOUND VAC CHANGE TO RIGHT LEG;  Surgeon: Kendal Franky SQUIBB, MD;  Location: [facility] OR;  Service: Orthopedics;  Laterality: Right;   CORONARY ANGIOPLASTY  10/23/2019   DES RCA, DES RPDA 10/23/19 [name])   EYE SURGERY     HARDWARE REMOVAL Right 10/06/2019   Procedure: HARDWARE REMOVAL;  Surgeon: Kendal Franky SQUIBB, MD;  Location: [facility] OR;  Service: Orthopedics;  Laterality: Right;   HARDWARE REMOVAL Right 08/23/2020   Procedure: HARDWARE REMOVAL;  Surgeon: Kendal Franky SQUIBB, MD;  Location: [facility] OR;  Service: Orthopedics;  Laterality: Right;   I & D EXTREMITY Right 10/06/2019   Procedure: IRRIGATION AND DEBRIDEMENT EXTREMITY;  Surgeon: Kendal Franky SQUIBB, MD;  Location: [facility] OR;  Service: Orthopedics;  Laterality: Right;   I & D EXTREMITY Right 10/09/2019   Procedure: IRRIGATION AND DEBRIDEMENT EXTREMITY and WOUND VAC CHANGE RIGHT ANKLE;  Surgeon: Kendal Franky SQUIBB, MD;  Location: [facility] OR;  Service: Orthopedics;  Laterality: Right;   ORIF ANKLE FRACTURE Right 07/26/2019   Procedure: OPEN REDUCTION INTERNAL FIXATION (ORIF) ANKLE FRACTURE;  Surgeon: Kendal Franky SQUIBB, MD;  Location: [facility] OR;  Service: Orthopedics;  Laterality: Right;  OPEN REDUCTION INTERNAL FIXATION (ORIF) ANKLE FRACTURE    TOE AMPUTATION      Family History family history includes Congestive Heart Failure in her father and maternal  grandfather; Diabetes in her sister; Diabetes Mellitus II in her mother; Heart attack in her mother; Stomach cancer in her father; Stroke in her mother.  Social History Social History   Socioeconomic History   Marital status: Divorced    Spouse name: Not on file   Number of children: Not on file   Years of education: Not on file   Highest education level: Not on file  Occupational History   Not on file  Tobacco Use   Smoking status: Never   Smokeless tobacco: Never  Vaping Use   Vaping status: Never Used  Substance and Sexual Activity   Alcohol  use: Not Currently   Drug use: Not Currently   Sexual activity: Not on file    Comment: Hysterectomy  Other Topics Concern    Not on file  Social History Narrative   Not on file   Social Drivers of Health   Tobacco Use: Low Risk (11/29/2024)   Patient History    Smoking Tobacco Use: Never    Smokeless Tobacco Use: Never    Passive Exposure: Not on file  Financial Resource Strain: Not on file  Food Insecurity: No Food Insecurity (08/10/2023)   Hunger Vital Sign    Worried About Running Out of Food in the Last Year: Never true    Ran Out of Food in the Last Year: Never true  Transportation Needs: No Transportation Needs (08/10/2023)   PRAPARE - Administrator, Civil Service (Medical): No    Lack of Transportation (Non-Medical): No  Physical Activity: Not on file  Stress: Not on file  Social Connections: Not on file  Intimate Partner Violence: Not At Risk (08/10/2023)   Humiliation, Afraid, Rape, and Kick questionnaire    Fear of Current or Ex-Partner: No    Emotionally Abused: No    Physically Abused: No    Sexually Abused: No  Depression (PHQ2-9): Low Risk (09/18/2024)   Depression (PHQ2-9)    PHQ-2 Score: 0  Alcohol  Screen: Low Risk (08/10/2023)   Alcohol  Screen    Last Alcohol  Screening Score (AUDIT): 0  Housing: Low Risk (08/10/2023)   Housing    Last Housing Risk Score: 0  Utilities: Not At Risk (08/10/2023)   AHC Utilities    Threatened with loss of utilities: No  Health Literacy: Not on file    Lab Results  Component Value Date   HGBA1C 7.1 (A) 08/10/2024   HGBA1C 7.3 (A) 05/10/2024   HGBA1C 7.2 (A) 12/22/2023   Lab Results  Component Value Date   CHOL 131 07/20/2024   Lab Results  Component Value Date   HDL 58 07/20/2024   Lab Results  Component Value Date   LDLCALC 52 07/20/2024   Lab Results  Component Value Date   TRIG 124 07/20/2024   Lab Results  Component Value Date   CHOLHDL 2.3 07/20/2024   Lab Results  Component Value Date   CREATININE 2.26 (H) 07/20/2024   Lab Results  Component Value Date   GFR 27.95 (L) 12/09/2021   Lab Results  Component  Value Date   MICROALBUR 1.6 07/20/2024       Component Value Date/Time   NA 141 07/20/2024 0806   K 4.9 07/20/2024 0806   CL 105 07/20/2024 0806   CO2 30 07/20/2024 0806   GLUCOSE 131 (H) 07/20/2024 0806   BUN 44 (H) 07/20/2024 0806   CREATININE 2.26 (H) 07/20/2024 0806   CALCIUM  9.5 07/20/2024 0806   PROT 7.1 07/20/2024 0806  ALBUMIN 3.3 (L) 08/12/2023 1834   AST 14 07/20/2024 0806   ALT 9 07/20/2024 0806   ALKPHOS 122 08/12/2023 1834   BILITOT 0.3 07/20/2024 0806   GFRNONAA 33 (L) 08/18/2023 0633   GFRAA 31 (L) 07/12/2020 1356      Latest Ref Rng & Units 07/20/2024    8:06 AM 12/22/2023    1:56 PM 08/18/2023    6:33 AM  BMP  Glucose 65 - 99 mg/dL 868  752  619   BUN 7 - 25 mg/dL 44  26  33   Creatinine 0.50 - 1.03 mg/dL 7.73  8.07  8.15   BUN/Creat Ratio 6 - 22 (calc) 19  14    Sodium 135 - 146 mmol/L 141  141  135   Potassium 3.5 - 5.3 mmol/L 4.9  4.8  4.6   Chloride 98 - 110 mmol/L 105  102  100   CO2 20 - 32 mmol/L 30  27  26    Calcium  8.6 - 10.4 mg/dL 9.5  9.9  9.1        Component Value Date/Time   WBC 4.4 08/09/2023 1848   RBC 4.44 08/09/2023 1848   HGB 12.7 08/09/2023 1848   HCT 39.0 08/09/2023 1848   PLT 171 08/09/2023 1848   MCV 87.8 08/09/2023 1848   MCH 28.6 08/09/2023 1848   MCHC 32.6 08/09/2023 1848   RDW 13.5 08/09/2023 1848   LYMPHSABS 1.0 08/09/2023 1848   MONOABS 0.3 08/09/2023 1848   EOSABS 0.0 08/09/2023 1848   BASOSABS 0.0 08/09/2023 1848     Parts of this note may have been dictated using voice recognition software. There may be variances in spelling and vocabulary which are unintentional. Not all errors are proofread. Please notify the dino if any discrepancies are noted or if the meaning of any statement is not clear.   "

## 2024-12-01 ENCOUNTER — Encounter: Payer: Self-pay | Admitting: "Endocrinology

## 2024-12-07 ENCOUNTER — Encounter: Admitting: Dietician

## 2024-12-19 ENCOUNTER — Telehealth: Payer: Self-pay | Admitting: "Endocrinology

## 2024-12-19 NOTE — Telephone Encounter (Signed)
 MEDICATION:  FreeStyle Libre 3 Sensor Continuous Glucose Sensor (FREESTYLE LIBRE 3 SENSOR) MISC   (PLUS)  PHARMACY:  [facility]   [phone]  HAS THE PATIENT CONTACTED THEIR PHARMACY?  Yes  LAST REFILL:  @@LASTREFILL @  IS THIS A 90 DAY SUPPLY : Yes  IS PATIENT OUT OF MEDICATION: No  IF NOT; HOW MUCH IS LEFT: Put last sensor on today  LAST APPOINTMENT DATE: @1 /14/2026  NEXT APPOINTMENT DATE:@4 /14/2026  DO WE HAVE YOUR PERMISSION TO LEAVE A DETAILED MESSAGE?: Yes  OTHER COMMENTS:    **Let patient know to contact pharmacy at the end of the day to make sure medication is ready. **  ** Please notify patient to allow 48-72 hours to process**  **Encourage patient to contact the pharmacy for refills or they can request refills through [facility]**

## 2024-12-21 ENCOUNTER — Other Ambulatory Visit: Payer: Self-pay

## 2024-12-21 DIAGNOSIS — E11649 Type 2 diabetes mellitus with hypoglycemia without coma: Secondary | ICD-10-CM

## 2024-12-21 NOTE — Telephone Encounter (Signed)
 Rx for [place] faxed to [facility]

## 2024-12-21 NOTE — Telephone Encounter (Signed)
 Patient called requesting refill for CGM sensors. Order placed on [facility] with [facility] as directed by note from member. Patient made aware via Mychart

## 2025-01-01 ENCOUNTER — Encounter: Admitting: Dietician

## 2025-02-27 ENCOUNTER — Ambulatory Visit: Admitting: "Endocrinology

## 2025-02-28 ENCOUNTER — Ambulatory Visit: Admitting: Podiatry
